# Patient Record
Sex: Female | Born: 1950
Health system: Southern US, Community
[De-identification: ages and names within clinical notes are randomized; demographics above are authoritative.]

## PROBLEM LIST (undated history)

## (undated) DIAGNOSIS — E079 Disorder of thyroid, unspecified: Secondary | ICD-10-CM

## (undated) DIAGNOSIS — H269 Unspecified cataract: Secondary | ICD-10-CM

## (undated) DIAGNOSIS — C329 Malignant neoplasm of larynx, unspecified: Secondary | ICD-10-CM

## (undated) DIAGNOSIS — Z923 Personal history of irradiation: Secondary | ICD-10-CM

## (undated) HISTORY — DX: Personal history of irradiation: Z92.3

## (undated) HISTORY — PX: CATARACT EXTRACTION, BILATERAL: SHX1313

## (undated) MED FILL — Dexamethasone Sodium Phosphate Inj 100 MG/10ML: INTRAMUSCULAR | Qty: 1 | Status: AC

---

## 2001-06-08 ENCOUNTER — Other Ambulatory Visit: Admission: RE | Admit: 2001-06-08 | Discharge: 2001-06-08 | Payer: Self-pay | Admitting: Family Medicine

## 2007-05-13 ENCOUNTER — Ambulatory Visit: Payer: Self-pay | Admitting: Cardiology

## 2007-05-27 ENCOUNTER — Ambulatory Visit: Payer: Self-pay

## 2007-05-27 ENCOUNTER — Encounter: Payer: Self-pay | Admitting: Cardiology

## 2007-06-05 ENCOUNTER — Ambulatory Visit: Payer: Self-pay | Admitting: Cardiology

## 2010-10-15 ENCOUNTER — Encounter: Payer: Self-pay | Admitting: Family Medicine

## 2011-02-05 NOTE — Assessment & Plan Note (Signed)
[facility]                            CARDIOLOGY OFFICE NOTE   NAME:[name], [name]                        MRN:          [id number]  DATE:05/13/2007                            DOB:          12-Apr-1951    REASON FOR CONSULTATION:  Relative hypotension and episodes of syncope.   HISTORY OF PRESENT ILLNESS:  [name] [name] is a pleasant 60 year old woman  with no reported chronic major medical conditions. She states that  approximately 1 to 2 months ago she noted that she fairly abruptly began  to feel more weak in general, particularly when she was standing. She  had a blackout spell after standing and ambulating for a period of  time preceded by a feeling of black dots in front of her eyes and  clammy sensation. This was followed by a frank fall to the ground and  a brief episode of syncope,  although she states that after this  occurred she was able to go about her business without difficultly. She  had a subsequent event similar to this although she was able to abort  actual syncope by sitting down and putting her head down. She has also  noted a relative increase in lower extremity edema but no orthopnea or  PND. She has had no sense of palpitations and in fact has been wearing  an event recorder. The available strips that I have show sinus rhythm  with no significant bradycardia, no ventricular arrhythmias, and only  occasional premature supraventricular complexes. She was noted to have  fairly low blood pressures on evaluation initially,with systolics down  in the 80 range. She was actually placed in Florinef and does note that  she has had improvement in her symptoms. She no longer has experienced  any significant dizziness or syncope. Blood pressure on a follow up  visit in late July was 100/70, heart rate in the 80s on Florinef. She  has also liberalized her salt intake. On the 6th of August blood  pressure was 104/72 with heart rate in the  90s.   Proceeding this onset of symptomatology she denies having any other  illness. She has had some abdominal pain, febrile illnesses, cough,  obvious sick contacts, changes in appetite or bowel habits. She has no  clearly documented history of adrenal disease. No other major abdominal  surgeries other than a prior Caesarian section in the 1980s.   ALLERGIES:  No known drug allergies.   PRESENT MEDICATIONS:  1. Florinef 0.1 mg p.o. daily.  2. Woman's multivitamin daily.  3. Aspirin 81 mg p.o. daily.   PAST MEDICAL HISTORY:  As outlined in above.   FAMILY HISTORY:  Significant for death of patient's father at age 12  with a heart attack.   REVIEW OF SYSTEMS:  As described in history of present illness.  Otherwise negative.   SOCIAL HISTORY:  Patient has 4 children. She works at an [employer], hospital. She has a 1 pack per day tobacco abuse history for 20 years.  No significant alcohol use. Drinks 3 glasses of caffeinated tea a day.   PHYSICAL  EXAMINATION:  Supine blood pressure 120/76 with a heart rate of  86, seated blood pressure 123/80 with a heart rate of 88, and standing  blood pressure after 5 minutes of 122/82 heart rate of 93. No symptoms  elicited during this time. This is an overweight woman 220 pounds  otherwise in no acute distress.  HEENT: Normal.  NECK: Supple. No elevated jugular venous pressure or loud bruits. No  thyromegaly is noted.  LUNGS: Clear. Somewhat diminished breath sounds. No wheezing or labored  breathing.  CARDIAC EXAM: Reveals a regular rate and rhythm, somewhat distant heart  sounds. No loud murmur or S3 gallop. No pericardial rub is evident.  ABDOMEN: Soft, nontender. No bruits. Bowel sounds are present.  EXTREMITIES: Exhibit trace to 1+ edema below the knees bilaterally. No  asymmetry.  SKIN: Warm and dry.  Distal pulses are 2+.  MUSCULOSKELETAL: No kyphosis is noted.  NEURO/PSYCHIATRIC: Patient is alert and oriented x3. Affect is  normal.   A 12-lead electrocardiogram today shows sinus rhythm, relatively low  voltage, otherwise no significant changes.   IMPRESSION/RECOMMENDATIONS:  1. History of relative hypotension associated with a reported episode      of syncope and an additional episode of near syncope. This has been      orthostatic in description and not associated with any palpitations      or obvious dysrhythmias based on present 30 day event recorder. The      resting electrocardiogram is relatively nonspecific with the except      of low voltage. She has had no chest pain or breathlessness      associated with this and preceding the onset of these symptoms has      never had any prior similar symptomatology. She has had no fainting      episodes in the past. She has noted an improvement on Florinef and      is in fact normotensive and not orthostatic today. She has had      other recent blood work indicating normal renal function, normal      potassium and sodium, and is generally feeling better at this      point. She does have a family history of premature cardiovascular      disease and has not had prior risk stratification. I think it would      be unusual for her present symptoms to be related to underlying      ischemic heart disease, although not unreasonable to assess      particularly with an eye towards left ventricular function. We      talked about proceeding with an echocardiogram and an adenosine      Myoview for basic cardiac risk stratification. If these studies are      normal it may be worth considering other etiologies such as adrenal      insufficiency, although one would wonder why this occurred suddenly      without any other obvious symptoms. I have asked her to continue      Florinef.  Neurocardiogenic syncope would be another obvious      concern, although again one would wonder why this has all of a      sudden become a problem without any prior symptomatology. I will       have her      follow up in the office over the next few weeks to discuss the      results of her testing.  2. Further  plans to follow.     Jonelle Sidle, MD  Electronically Signed    SGM/MedQ  DD: 05/13/2007  DT: 05/14/2007  Job #: [id number]   cc:   Burnell Blanks, MD

## 2011-02-05 NOTE — Assessment & Plan Note (Signed)
[facility]                            CARDIOLOGY OFFICE NOTE   NAME:[name] [name]                        MRN:          [id number]  DATE:06/05/2007                            DOB:          [birth year]    PRIMARY CARE PHYSICIAN:  Dr. Sharman Crate Hamrick   REASON FOR VISIT:  Follow up cardiac testing.   HISTORY OF PRESENT ILLNESS:  I saw [name] [name] recently in August. Her  history is detailed in my previous note. I referred her for basic  cardiac risk stratification. This was overall reassuring including an  echocardiogram demonstrating a left ventricular ejection fraction of 50%  to 55% with no major valvular abnormalities. She also had a Myoview  study demonstrating no significant ischemia or scar with an ejection  fraction of 67%. There were no diagnostic ST segment changes to suggest  ischemia. The patient states that she still has occasional feelings of  light headedness and systolic blood pressures recorded in the 80s at  times. She is taking her Florinef and generally this has helped with the  severity of her symptoms. I talked with her today about the fact that  there did not seem to be a pure cardiac cause for her symptoms. A  neurocardiogenic mechanism could be considered although it would be  unusual for this to present suddenly and without any prior symptoms at a  younger age, and could be managed with Florinef in any event. It would  also be reasonable to consider other endocrine causes such as thyroid or  adrenal disease, and perhaps even a neurological assessment if nothing  else is uncovered.   ALLERGIES:  No known drug allergies.   CURRENT MEDICATIONS:  1. Fludrocortisone 0.1 mg daily.  2. Multivitamin.  3. Aspirin 81 mg daily.   REVIEW OF SYSTEMS:  As described in the history of present illness.   PHYSICAL EXAMINATION:  VITAL SIGNS:  Today's blood pressure is 115/80,  heart rate 87, weight 223 pounds.  GENERAL:  The patient is  comfortable and in no acute distress. There  have been no significant changes in her examination which is documented  in the recent note from late August.   IMPRESSION AND RECOMMENDATIONS:  1. Hypotension, some largely orthostatic, improved after the addition      of Florinef. This was originally associated with syncope, although      frank syncopal events have resolved. Etiology is not entirely      clear, although it does not appear to be purely cardiac based on      her reassuring ischemic testing, findings of overall normal left      ventricular ejection fraction, and no significant pericardial      effusion. A neurocardiogenic mechanism could certainly be      considered, although it is a little unusual that this would all of      a sudden become a problem without having had any prior symptoms at      a younger age. We do not plan any additional cardiac testing at      this particular time  and I will refer the patient back to Dr.      Nathanial Rancher for consideration of other endocrine causes such as thyroid      or adrenal disease and perhaps even a formal neurological      consultation.  2. Cardiology follow up can be p.r.n.     Jonelle Sidle, MD  Electronically Signed    SGM/MedQ  DD: 06/05/2007  DT: 06/06/2007  Job #: [id number]   cc:   Burnell Blanks, MD

## 2016-01-16 DIAGNOSIS — Z79899 Other long term (current) drug therapy: Secondary | ICD-10-CM | POA: Diagnosis not present

## 2016-01-16 DIAGNOSIS — E559 Vitamin D deficiency, unspecified: Secondary | ICD-10-CM | POA: Diagnosis not present

## 2016-01-16 DIAGNOSIS — E781 Pure hyperglyceridemia: Secondary | ICD-10-CM | POA: Diagnosis not present

## 2016-01-16 DIAGNOSIS — E039 Hypothyroidism, unspecified: Secondary | ICD-10-CM | POA: Diagnosis not present

## 2016-01-16 DIAGNOSIS — E538 Deficiency of other specified B group vitamins: Secondary | ICD-10-CM | POA: Diagnosis not present

## 2016-01-17 DIAGNOSIS — E559 Vitamin D deficiency, unspecified: Secondary | ICD-10-CM | POA: Diagnosis not present

## 2016-01-17 DIAGNOSIS — E039 Hypothyroidism, unspecified: Secondary | ICD-10-CM | POA: Diagnosis not present

## 2016-01-17 DIAGNOSIS — E538 Deficiency of other specified B group vitamins: Secondary | ICD-10-CM | POA: Diagnosis not present

## 2016-01-17 DIAGNOSIS — E669 Obesity, unspecified: Secondary | ICD-10-CM | POA: Diagnosis not present

## 2016-01-17 DIAGNOSIS — E781 Pure hyperglyceridemia: Secondary | ICD-10-CM | POA: Diagnosis not present

## 2016-01-17 DIAGNOSIS — Z1231 Encounter for screening mammogram for malignant neoplasm of breast: Secondary | ICD-10-CM | POA: Diagnosis not present

## 2016-01-17 DIAGNOSIS — Z6838 Body mass index (BMI) 38.0-38.9, adult: Secondary | ICD-10-CM | POA: Diagnosis not present

## 2016-03-19 DIAGNOSIS — Z1231 Encounter for screening mammogram for malignant neoplasm of breast: Secondary | ICD-10-CM | POA: Diagnosis not present

## 2016-03-25 DIAGNOSIS — M1711 Unilateral primary osteoarthritis, right knee: Secondary | ICD-10-CM | POA: Diagnosis not present

## 2016-05-07 DIAGNOSIS — H2513 Age-related nuclear cataract, bilateral: Secondary | ICD-10-CM | POA: Diagnosis not present

## 2016-05-14 DIAGNOSIS — Z6838 Body mass index (BMI) 38.0-38.9, adult: Secondary | ICD-10-CM | POA: Diagnosis not present

## 2016-05-14 DIAGNOSIS — Z01818 Encounter for other preprocedural examination: Secondary | ICD-10-CM | POA: Diagnosis not present

## 2016-05-14 DIAGNOSIS — E039 Hypothyroidism, unspecified: Secondary | ICD-10-CM | POA: Diagnosis not present

## 2016-05-14 DIAGNOSIS — H269 Unspecified cataract: Secondary | ICD-10-CM | POA: Diagnosis not present

## 2016-05-20 DIAGNOSIS — F17210 Nicotine dependence, cigarettes, uncomplicated: Secondary | ICD-10-CM | POA: Diagnosis not present

## 2016-05-20 DIAGNOSIS — H2512 Age-related nuclear cataract, left eye: Secondary | ICD-10-CM | POA: Diagnosis not present

## 2016-05-20 DIAGNOSIS — E785 Hyperlipidemia, unspecified: Secondary | ICD-10-CM | POA: Diagnosis not present

## 2016-05-22 DIAGNOSIS — H43393 Other vitreous opacities, bilateral: Secondary | ICD-10-CM | POA: Diagnosis not present

## 2016-05-22 DIAGNOSIS — Z9889 Other specified postprocedural states: Secondary | ICD-10-CM | POA: Diagnosis not present

## 2016-05-22 DIAGNOSIS — H2513 Age-related nuclear cataract, bilateral: Secondary | ICD-10-CM | POA: Diagnosis not present

## 2016-05-22 DIAGNOSIS — H35342 Macular cyst, hole, or pseudohole, left eye: Secondary | ICD-10-CM | POA: Diagnosis not present

## 2016-05-29 DIAGNOSIS — H2512 Age-related nuclear cataract, left eye: Secondary | ICD-10-CM | POA: Diagnosis not present

## 2016-06-03 DIAGNOSIS — H2511 Age-related nuclear cataract, right eye: Secondary | ICD-10-CM | POA: Diagnosis not present

## 2016-06-03 DIAGNOSIS — F17210 Nicotine dependence, cigarettes, uncomplicated: Secondary | ICD-10-CM | POA: Diagnosis not present

## 2016-06-03 DIAGNOSIS — E785 Hyperlipidemia, unspecified: Secondary | ICD-10-CM | POA: Diagnosis not present

## 2016-06-03 DIAGNOSIS — H269 Unspecified cataract: Secondary | ICD-10-CM | POA: Diagnosis not present

## 2016-07-05 DIAGNOSIS — Z23 Encounter for immunization: Secondary | ICD-10-CM | POA: Diagnosis not present

## 2016-07-23 DIAGNOSIS — E538 Deficiency of other specified B group vitamins: Secondary | ICD-10-CM | POA: Diagnosis not present

## 2016-07-23 DIAGNOSIS — E039 Hypothyroidism, unspecified: Secondary | ICD-10-CM | POA: Diagnosis not present

## 2016-07-23 DIAGNOSIS — E781 Pure hyperglyceridemia: Secondary | ICD-10-CM | POA: Diagnosis not present

## 2016-07-23 DIAGNOSIS — E559 Vitamin D deficiency, unspecified: Secondary | ICD-10-CM | POA: Diagnosis not present

## 2016-07-23 DIAGNOSIS — R609 Edema, unspecified: Secondary | ICD-10-CM | POA: Diagnosis not present

## 2016-07-25 DIAGNOSIS — E669 Obesity, unspecified: Secondary | ICD-10-CM | POA: Diagnosis not present

## 2016-07-25 DIAGNOSIS — E781 Pure hyperglyceridemia: Secondary | ICD-10-CM | POA: Diagnosis not present

## 2016-07-25 DIAGNOSIS — Z23 Encounter for immunization: Secondary | ICD-10-CM | POA: Diagnosis not present

## 2016-07-25 DIAGNOSIS — E538 Deficiency of other specified B group vitamins: Secondary | ICD-10-CM | POA: Diagnosis not present

## 2016-07-25 DIAGNOSIS — Z9181 History of falling: Secondary | ICD-10-CM | POA: Diagnosis not present

## 2016-07-25 DIAGNOSIS — E785 Hyperlipidemia, unspecified: Secondary | ICD-10-CM | POA: Diagnosis not present

## 2016-07-25 DIAGNOSIS — G2581 Restless legs syndrome: Secondary | ICD-10-CM | POA: Diagnosis not present

## 2016-07-25 DIAGNOSIS — K219 Gastro-esophageal reflux disease without esophagitis: Secondary | ICD-10-CM | POA: Diagnosis not present

## 2016-07-25 DIAGNOSIS — M2550 Pain in unspecified joint: Secondary | ICD-10-CM | POA: Diagnosis not present

## 2016-07-25 DIAGNOSIS — Z6839 Body mass index (BMI) 39.0-39.9, adult: Secondary | ICD-10-CM | POA: Diagnosis not present

## 2016-07-25 DIAGNOSIS — Z1389 Encounter for screening for other disorder: Secondary | ICD-10-CM | POA: Diagnosis not present

## 2016-07-25 DIAGNOSIS — E039 Hypothyroidism, unspecified: Secondary | ICD-10-CM | POA: Diagnosis not present

## 2017-01-23 DIAGNOSIS — E781 Pure hyperglyceridemia: Secondary | ICD-10-CM | POA: Diagnosis not present

## 2017-01-23 DIAGNOSIS — R609 Edema, unspecified: Secondary | ICD-10-CM | POA: Diagnosis not present

## 2017-01-23 DIAGNOSIS — E039 Hypothyroidism, unspecified: Secondary | ICD-10-CM | POA: Diagnosis not present

## 2017-01-23 DIAGNOSIS — E559 Vitamin D deficiency, unspecified: Secondary | ICD-10-CM | POA: Diagnosis not present

## 2017-01-28 DIAGNOSIS — E538 Deficiency of other specified B group vitamins: Secondary | ICD-10-CM | POA: Diagnosis not present

## 2017-01-28 DIAGNOSIS — Z6841 Body Mass Index (BMI) 40.0 and over, adult: Secondary | ICD-10-CM | POA: Diagnosis not present

## 2017-01-28 DIAGNOSIS — M1711 Unilateral primary osteoarthritis, right knee: Secondary | ICD-10-CM | POA: Diagnosis not present

## 2017-01-28 DIAGNOSIS — Z87891 Personal history of nicotine dependence: Secondary | ICD-10-CM | POA: Diagnosis not present

## 2017-01-28 DIAGNOSIS — E039 Hypothyroidism, unspecified: Secondary | ICD-10-CM | POA: Diagnosis not present

## 2017-01-28 DIAGNOSIS — E559 Vitamin D deficiency, unspecified: Secondary | ICD-10-CM | POA: Diagnosis not present

## 2017-01-28 DIAGNOSIS — E781 Pure hyperglyceridemia: Secondary | ICD-10-CM | POA: Diagnosis not present

## 2017-01-28 DIAGNOSIS — L309 Dermatitis, unspecified: Secondary | ICD-10-CM | POA: Diagnosis not present

## 2017-05-19 DIAGNOSIS — M170 Bilateral primary osteoarthritis of knee: Secondary | ICD-10-CM | POA: Diagnosis not present

## 2017-05-19 DIAGNOSIS — M25562 Pain in left knee: Secondary | ICD-10-CM | POA: Diagnosis not present

## 2017-05-19 DIAGNOSIS — M25561 Pain in right knee: Secondary | ICD-10-CM | POA: Diagnosis not present

## 2017-05-27 DIAGNOSIS — M25561 Pain in right knee: Secondary | ICD-10-CM | POA: Diagnosis not present

## 2017-05-27 DIAGNOSIS — M1711 Unilateral primary osteoarthritis, right knee: Secondary | ICD-10-CM | POA: Diagnosis not present

## 2017-06-03 DIAGNOSIS — M25562 Pain in left knee: Secondary | ICD-10-CM | POA: Diagnosis not present

## 2017-06-03 DIAGNOSIS — M1712 Unilateral primary osteoarthritis, left knee: Secondary | ICD-10-CM | POA: Diagnosis not present

## 2017-06-10 DIAGNOSIS — M170 Bilateral primary osteoarthritis of knee: Secondary | ICD-10-CM | POA: Diagnosis not present

## 2017-06-10 DIAGNOSIS — M25561 Pain in right knee: Secondary | ICD-10-CM | POA: Diagnosis not present

## 2017-06-10 DIAGNOSIS — M25562 Pain in left knee: Secondary | ICD-10-CM | POA: Diagnosis not present

## 2017-07-08 DIAGNOSIS — Z23 Encounter for immunization: Secondary | ICD-10-CM | POA: Diagnosis not present

## 2017-07-31 DIAGNOSIS — H15002 Unspecified scleritis, left eye: Secondary | ICD-10-CM | POA: Diagnosis not present

## 2017-07-31 DIAGNOSIS — H109 Unspecified conjunctivitis: Secondary | ICD-10-CM | POA: Diagnosis not present

## 2017-07-31 DIAGNOSIS — Z87891 Personal history of nicotine dependence: Secondary | ICD-10-CM | POA: Diagnosis not present

## 2017-08-11 DIAGNOSIS — E781 Pure hyperglyceridemia: Secondary | ICD-10-CM | POA: Diagnosis not present

## 2017-08-11 DIAGNOSIS — E039 Hypothyroidism, unspecified: Secondary | ICD-10-CM | POA: Diagnosis not present

## 2017-08-11 DIAGNOSIS — E559 Vitamin D deficiency, unspecified: Secondary | ICD-10-CM | POA: Diagnosis not present

## 2017-08-11 DIAGNOSIS — E538 Deficiency of other specified B group vitamins: Secondary | ICD-10-CM | POA: Diagnosis not present

## 2017-08-18 DIAGNOSIS — E781 Pure hyperglyceridemia: Secondary | ICD-10-CM | POA: Diagnosis not present

## 2017-08-18 DIAGNOSIS — Z6839 Body mass index (BMI) 39.0-39.9, adult: Secondary | ICD-10-CM | POA: Diagnosis not present

## 2017-08-18 DIAGNOSIS — Z87891 Personal history of nicotine dependence: Secondary | ICD-10-CM | POA: Diagnosis not present

## 2017-08-18 DIAGNOSIS — E538 Deficiency of other specified B group vitamins: Secondary | ICD-10-CM | POA: Diagnosis not present

## 2017-08-18 DIAGNOSIS — E039 Hypothyroidism, unspecified: Secondary | ICD-10-CM | POA: Diagnosis not present

## 2017-08-18 DIAGNOSIS — E559 Vitamin D deficiency, unspecified: Secondary | ICD-10-CM | POA: Diagnosis not present

## 2017-08-18 DIAGNOSIS — K219 Gastro-esophageal reflux disease without esophagitis: Secondary | ICD-10-CM | POA: Diagnosis not present

## 2017-09-23 HISTORY — PX: KNEE CARTILAGE SURGERY: SHX688

## 2017-10-09 DIAGNOSIS — M25561 Pain in right knee: Secondary | ICD-10-CM | POA: Diagnosis not present

## 2017-10-09 DIAGNOSIS — M25461 Effusion, right knee: Secondary | ICD-10-CM | POA: Diagnosis not present

## 2017-10-09 DIAGNOSIS — M170 Bilateral primary osteoarthritis of knee: Secondary | ICD-10-CM | POA: Diagnosis not present

## 2017-10-09 DIAGNOSIS — M1711 Unilateral primary osteoarthritis, right knee: Secondary | ICD-10-CM | POA: Diagnosis not present

## 2017-10-09 DIAGNOSIS — M21162 Varus deformity, not elsewhere classified, left knee: Secondary | ICD-10-CM | POA: Diagnosis not present

## 2017-10-09 DIAGNOSIS — M25562 Pain in left knee: Secondary | ICD-10-CM | POA: Diagnosis not present

## 2017-10-22 DIAGNOSIS — M1711 Unilateral primary osteoarthritis, right knee: Secondary | ICD-10-CM | POA: Diagnosis not present

## 2017-10-22 DIAGNOSIS — M25561 Pain in right knee: Secondary | ICD-10-CM | POA: Diagnosis not present

## 2017-10-29 DIAGNOSIS — M1711 Unilateral primary osteoarthritis, right knee: Secondary | ICD-10-CM | POA: Diagnosis not present

## 2017-10-29 DIAGNOSIS — M25561 Pain in right knee: Secondary | ICD-10-CM | POA: Diagnosis not present

## 2017-11-05 DIAGNOSIS — M25561 Pain in right knee: Secondary | ICD-10-CM | POA: Diagnosis not present

## 2017-11-05 DIAGNOSIS — M1711 Unilateral primary osteoarthritis, right knee: Secondary | ICD-10-CM | POA: Diagnosis not present

## 2017-11-19 DIAGNOSIS — M1711 Unilateral primary osteoarthritis, right knee: Secondary | ICD-10-CM | POA: Diagnosis not present

## 2017-11-19 DIAGNOSIS — M25561 Pain in right knee: Secondary | ICD-10-CM | POA: Diagnosis not present

## 2017-11-28 DIAGNOSIS — J069 Acute upper respiratory infection, unspecified: Secondary | ICD-10-CM | POA: Diagnosis not present

## 2017-11-28 DIAGNOSIS — Z6837 Body mass index (BMI) 37.0-37.9, adult: Secondary | ICD-10-CM | POA: Diagnosis not present

## 2017-11-28 DIAGNOSIS — Z87891 Personal history of nicotine dependence: Secondary | ICD-10-CM | POA: Diagnosis not present

## 2018-02-13 DIAGNOSIS — Z139 Encounter for screening, unspecified: Secondary | ICD-10-CM | POA: Diagnosis not present

## 2018-02-13 DIAGNOSIS — E559 Vitamin D deficiency, unspecified: Secondary | ICD-10-CM | POA: Diagnosis not present

## 2018-02-13 DIAGNOSIS — Z1211 Encounter for screening for malignant neoplasm of colon: Secondary | ICD-10-CM | POA: Diagnosis not present

## 2018-02-13 DIAGNOSIS — Z9181 History of falling: Secondary | ICD-10-CM | POA: Diagnosis not present

## 2018-02-13 DIAGNOSIS — E538 Deficiency of other specified B group vitamins: Secondary | ICD-10-CM | POA: Diagnosis not present

## 2018-02-13 DIAGNOSIS — E781 Pure hyperglyceridemia: Secondary | ICD-10-CM | POA: Diagnosis not present

## 2018-02-13 DIAGNOSIS — Z6837 Body mass index (BMI) 37.0-37.9, adult: Secondary | ICD-10-CM | POA: Diagnosis not present

## 2018-02-13 DIAGNOSIS — E669 Obesity, unspecified: Secondary | ICD-10-CM | POA: Diagnosis not present

## 2018-02-13 DIAGNOSIS — Z0000 Encounter for general adult medical examination without abnormal findings: Secondary | ICD-10-CM | POA: Diagnosis not present

## 2018-02-13 DIAGNOSIS — E785 Hyperlipidemia, unspecified: Secondary | ICD-10-CM | POA: Diagnosis not present

## 2018-02-13 DIAGNOSIS — Z136 Encounter for screening for cardiovascular disorders: Secondary | ICD-10-CM | POA: Diagnosis not present

## 2018-02-13 DIAGNOSIS — N959 Unspecified menopausal and perimenopausal disorder: Secondary | ICD-10-CM | POA: Diagnosis not present

## 2018-02-13 DIAGNOSIS — E039 Hypothyroidism, unspecified: Secondary | ICD-10-CM | POA: Diagnosis not present

## 2018-02-18 DIAGNOSIS — E559 Vitamin D deficiency, unspecified: Secondary | ICD-10-CM | POA: Diagnosis not present

## 2018-02-18 DIAGNOSIS — E039 Hypothyroidism, unspecified: Secondary | ICD-10-CM | POA: Diagnosis not present

## 2018-02-18 DIAGNOSIS — E781 Pure hyperglyceridemia: Secondary | ICD-10-CM | POA: Diagnosis not present

## 2018-02-18 DIAGNOSIS — E538 Deficiency of other specified B group vitamins: Secondary | ICD-10-CM | POA: Diagnosis not present

## 2018-03-13 DIAGNOSIS — R062 Wheezing: Secondary | ICD-10-CM | POA: Diagnosis not present

## 2018-03-13 DIAGNOSIS — J209 Acute bronchitis, unspecified: Secondary | ICD-10-CM | POA: Diagnosis not present

## 2018-03-13 DIAGNOSIS — R05 Cough: Secondary | ICD-10-CM | POA: Diagnosis not present

## 2018-03-23 DIAGNOSIS — M25561 Pain in right knee: Secondary | ICD-10-CM | POA: Diagnosis not present

## 2018-03-23 DIAGNOSIS — M1711 Unilateral primary osteoarthritis, right knee: Secondary | ICD-10-CM | POA: Diagnosis not present

## 2018-03-27 DIAGNOSIS — Z1211 Encounter for screening for malignant neoplasm of colon: Secondary | ICD-10-CM | POA: Diagnosis not present

## 2018-04-09 DIAGNOSIS — M79651 Pain in right thigh: Secondary | ICD-10-CM | POA: Diagnosis not present

## 2018-04-09 DIAGNOSIS — S76111A Strain of right quadriceps muscle, fascia and tendon, initial encounter: Secondary | ICD-10-CM | POA: Diagnosis not present

## 2018-05-27 DIAGNOSIS — M1711 Unilateral primary osteoarthritis, right knee: Secondary | ICD-10-CM | POA: Diagnosis not present

## 2018-05-27 DIAGNOSIS — M25561 Pain in right knee: Secondary | ICD-10-CM | POA: Diagnosis not present

## 2018-06-02 DIAGNOSIS — M25561 Pain in right knee: Secondary | ICD-10-CM | POA: Diagnosis not present

## 2018-06-02 DIAGNOSIS — M1711 Unilateral primary osteoarthritis, right knee: Secondary | ICD-10-CM | POA: Diagnosis not present

## 2018-06-08 DIAGNOSIS — M542 Cervicalgia: Secondary | ICD-10-CM | POA: Diagnosis not present

## 2018-06-08 DIAGNOSIS — M25561 Pain in right knee: Secondary | ICD-10-CM | POA: Diagnosis not present

## 2018-06-08 DIAGNOSIS — M1711 Unilateral primary osteoarthritis, right knee: Secondary | ICD-10-CM | POA: Diagnosis not present

## 2018-06-08 DIAGNOSIS — Z6836 Body mass index (BMI) 36.0-36.9, adult: Secondary | ICD-10-CM | POA: Diagnosis not present

## 2018-06-15 DIAGNOSIS — M25561 Pain in right knee: Secondary | ICD-10-CM | POA: Diagnosis not present

## 2018-06-15 DIAGNOSIS — M1711 Unilateral primary osteoarthritis, right knee: Secondary | ICD-10-CM | POA: Diagnosis not present

## 2018-06-22 DIAGNOSIS — M1711 Unilateral primary osteoarthritis, right knee: Secondary | ICD-10-CM | POA: Diagnosis not present

## 2018-06-22 DIAGNOSIS — M25561 Pain in right knee: Secondary | ICD-10-CM | POA: Diagnosis not present

## 2018-07-22 DIAGNOSIS — H6592 Unspecified nonsuppurative otitis media, left ear: Secondary | ICD-10-CM | POA: Diagnosis not present

## 2018-07-22 DIAGNOSIS — Z6835 Body mass index (BMI) 35.0-35.9, adult: Secondary | ICD-10-CM | POA: Diagnosis not present

## 2018-07-22 DIAGNOSIS — Z23 Encounter for immunization: Secondary | ICD-10-CM | POA: Diagnosis not present

## 2018-08-24 DIAGNOSIS — R609 Edema, unspecified: Secondary | ICD-10-CM | POA: Diagnosis not present

## 2018-08-24 DIAGNOSIS — E781 Pure hyperglyceridemia: Secondary | ICD-10-CM | POA: Diagnosis not present

## 2018-08-24 DIAGNOSIS — E039 Hypothyroidism, unspecified: Secondary | ICD-10-CM | POA: Diagnosis not present

## 2018-09-01 DIAGNOSIS — Z01419 Encounter for gynecological examination (general) (routine) without abnormal findings: Secondary | ICD-10-CM | POA: Diagnosis not present

## 2018-09-01 DIAGNOSIS — Z1231 Encounter for screening mammogram for malignant neoplasm of breast: Secondary | ICD-10-CM | POA: Diagnosis not present

## 2018-09-01 DIAGNOSIS — Z124 Encounter for screening for malignant neoplasm of cervix: Secondary | ICD-10-CM | POA: Diagnosis not present

## 2018-09-01 DIAGNOSIS — Z780 Asymptomatic menopausal state: Secondary | ICD-10-CM | POA: Diagnosis not present

## 2018-10-13 DIAGNOSIS — J019 Acute sinusitis, unspecified: Secondary | ICD-10-CM | POA: Diagnosis not present

## 2018-10-13 DIAGNOSIS — R070 Pain in throat: Secondary | ICD-10-CM | POA: Diagnosis not present

## 2018-11-02 DIAGNOSIS — Z13820 Encounter for screening for osteoporosis: Secondary | ICD-10-CM | POA: Diagnosis not present

## 2018-11-02 DIAGNOSIS — Z1231 Encounter for screening mammogram for malignant neoplasm of breast: Secondary | ICD-10-CM | POA: Diagnosis not present

## 2018-11-02 DIAGNOSIS — E2839 Other primary ovarian failure: Secondary | ICD-10-CM | POA: Diagnosis not present

## 2018-11-20 DIAGNOSIS — R490 Dysphonia: Secondary | ICD-10-CM | POA: Diagnosis not present

## 2018-11-20 DIAGNOSIS — E039 Hypothyroidism, unspecified: Secondary | ICD-10-CM | POA: Diagnosis not present

## 2018-11-20 DIAGNOSIS — Z6832 Body mass index (BMI) 32.0-32.9, adult: Secondary | ICD-10-CM | POA: Diagnosis not present

## 2018-11-20 DIAGNOSIS — R400 Somnolence: Secondary | ICD-10-CM | POA: Diagnosis not present

## 2018-11-22 DIAGNOSIS — C329 Malignant neoplasm of larynx, unspecified: Secondary | ICD-10-CM

## 2018-11-22 HISTORY — DX: Malignant neoplasm of larynx, unspecified: C32.9

## 2018-11-25 DIAGNOSIS — Z6832 Body mass index (BMI) 32.0-32.9, adult: Secondary | ICD-10-CM | POA: Diagnosis not present

## 2018-11-25 DIAGNOSIS — J209 Acute bronchitis, unspecified: Secondary | ICD-10-CM | POA: Diagnosis not present

## 2018-12-01 DIAGNOSIS — J209 Acute bronchitis, unspecified: Secondary | ICD-10-CM | POA: Diagnosis not present

## 2018-12-01 DIAGNOSIS — Z6832 Body mass index (BMI) 32.0-32.9, adult: Secondary | ICD-10-CM | POA: Diagnosis not present

## 2018-12-01 DIAGNOSIS — J385 Laryngeal spasm: Secondary | ICD-10-CM | POA: Diagnosis not present

## 2018-12-02 ENCOUNTER — Other Ambulatory Visit: Payer: Self-pay

## 2018-12-02 ENCOUNTER — Ambulatory Visit
Admission: RE | Admit: 2018-12-02 | Discharge: 2018-12-02 | Disposition: A | Discharge status: Home / Self Care | Payer: Medicare HMO | Source: Ambulatory Visit | Attending: Nurse Practitioner | Admitting: Nurse Practitioner

## 2018-12-02 ENCOUNTER — Other Ambulatory Visit: Payer: Self-pay | Admitting: Nurse Practitioner

## 2018-12-02 DIAGNOSIS — R05 Cough: Secondary | ICD-10-CM | POA: Diagnosis not present

## 2018-12-02 DIAGNOSIS — R633 Feeding difficulties: Secondary | ICD-10-CM | POA: Diagnosis not present

## 2018-12-02 DIAGNOSIS — R0603 Acute respiratory distress: Secondary | ICD-10-CM | POA: Diagnosis not present

## 2018-12-02 DIAGNOSIS — J40 Bronchitis, not specified as acute or chronic: Secondary | ICD-10-CM | POA: Diagnosis not present

## 2018-12-02 DIAGNOSIS — T884XXA Failed or difficult intubation, initial encounter: Secondary | ICD-10-CM | POA: Diagnosis not present

## 2018-12-02 DIAGNOSIS — D72829 Elevated white blood cell count, unspecified: Secondary | ICD-10-CM | POA: Diagnosis not present

## 2018-12-02 DIAGNOSIS — R0902 Hypoxemia: Secondary | ICD-10-CM | POA: Diagnosis not present

## 2018-12-02 DIAGNOSIS — Z9911 Dependence on respirator [ventilator] status: Secondary | ICD-10-CM | POA: Diagnosis not present

## 2018-12-02 DIAGNOSIS — R0689 Other abnormalities of breathing: Secondary | ICD-10-CM | POA: Diagnosis not present

## 2018-12-02 DIAGNOSIS — I959 Hypotension, unspecified: Secondary | ICD-10-CM | POA: Diagnosis not present

## 2018-12-02 DIAGNOSIS — J80 Acute respiratory distress syndrome: Secondary | ICD-10-CM | POA: Diagnosis not present

## 2018-12-02 DIAGNOSIS — E873 Alkalosis: Secondary | ICD-10-CM | POA: Diagnosis not present

## 2018-12-02 DIAGNOSIS — Z4682 Encounter for fitting and adjustment of non-vascular catheter: Secondary | ICD-10-CM | POA: Diagnosis not present

## 2018-12-02 DIAGNOSIS — R9389 Abnormal findings on diagnostic imaging of other specified body structures: Secondary | ICD-10-CM

## 2018-12-02 DIAGNOSIS — J9811 Atelectasis: Secondary | ICD-10-CM | POA: Diagnosis not present

## 2018-12-02 DIAGNOSIS — R1313 Dysphagia, pharyngeal phase: Secondary | ICD-10-CM | POA: Diagnosis not present

## 2018-12-02 DIAGNOSIS — R061 Stridor: Secondary | ICD-10-CM | POA: Diagnosis not present

## 2018-12-02 DIAGNOSIS — R599 Enlarged lymph nodes, unspecified: Secondary | ICD-10-CM | POA: Diagnosis not present

## 2018-12-02 DIAGNOSIS — R404 Transient alteration of awareness: Secondary | ICD-10-CM | POA: Diagnosis not present

## 2018-12-02 DIAGNOSIS — R918 Other nonspecific abnormal finding of lung field: Secondary | ICD-10-CM | POA: Diagnosis not present

## 2018-12-02 DIAGNOSIS — J690 Pneumonitis due to inhalation of food and vomit: Secondary | ICD-10-CM | POA: Diagnosis not present

## 2018-12-02 DIAGNOSIS — J9600 Acute respiratory failure, unspecified whether with hypoxia or hypercapnia: Secondary | ICD-10-CM | POA: Diagnosis not present

## 2018-12-02 DIAGNOSIS — R0600 Dyspnea, unspecified: Secondary | ICD-10-CM | POA: Diagnosis not present

## 2018-12-02 DIAGNOSIS — Z87891 Personal history of nicotine dependence: Secondary | ICD-10-CM | POA: Diagnosis not present

## 2018-12-02 DIAGNOSIS — Z430 Encounter for attention to tracheostomy: Secondary | ICD-10-CM | POA: Diagnosis not present

## 2018-12-02 DIAGNOSIS — R1310 Dysphagia, unspecified: Secondary | ICD-10-CM | POA: Diagnosis not present

## 2018-12-02 DIAGNOSIS — E872 Acidosis: Secondary | ICD-10-CM | POA: Diagnosis not present

## 2018-12-02 DIAGNOSIS — I21A1 Myocardial infarction type 2: Secondary | ICD-10-CM | POA: Diagnosis not present

## 2018-12-02 DIAGNOSIS — J810 Acute pulmonary edema: Secondary | ICD-10-CM | POA: Diagnosis not present

## 2018-12-02 DIAGNOSIS — J387 Other diseases of larynx: Secondary | ICD-10-CM | POA: Diagnosis not present

## 2018-12-02 DIAGNOSIS — Z930 Tracheostomy status: Secondary | ICD-10-CM | POA: Diagnosis not present

## 2018-12-02 DIAGNOSIS — J383 Other diseases of vocal cords: Secondary | ICD-10-CM | POA: Diagnosis not present

## 2018-12-02 DIAGNOSIS — R221 Localized swelling, mass and lump, neck: Secondary | ICD-10-CM | POA: Diagnosis not present

## 2018-12-02 DIAGNOSIS — J385 Laryngeal spasm: Secondary | ICD-10-CM | POA: Diagnosis present

## 2018-12-02 DIAGNOSIS — E43 Unspecified severe protein-calorie malnutrition: Secondary | ICD-10-CM | POA: Diagnosis not present

## 2018-12-02 DIAGNOSIS — J9602 Acute respiratory failure with hypercapnia: Secondary | ICD-10-CM | POA: Diagnosis not present

## 2018-12-02 DIAGNOSIS — Z7951 Long term (current) use of inhaled steroids: Secondary | ICD-10-CM | POA: Diagnosis not present

## 2018-12-02 DIAGNOSIS — J9601 Acute respiratory failure with hypoxia: Secondary | ICD-10-CM | POA: Diagnosis not present

## 2018-12-02 DIAGNOSIS — Z79899 Other long term (current) drug therapy: Secondary | ICD-10-CM | POA: Diagnosis not present

## 2018-12-02 DIAGNOSIS — E039 Hypothyroidism, unspecified: Secondary | ICD-10-CM | POA: Diagnosis not present

## 2018-12-02 DIAGNOSIS — C321 Malignant neoplasm of supraglottis: Secondary | ICD-10-CM | POA: Diagnosis not present

## 2018-12-02 DIAGNOSIS — M7989 Other specified soft tissue disorders: Secondary | ICD-10-CM | POA: Diagnosis not present

## 2018-12-02 DIAGNOSIS — J90 Pleural effusion, not elsewhere classified: Secondary | ICD-10-CM | POA: Diagnosis not present

## 2018-12-02 HISTORY — PX: TRACHEOSTOMY: SUR1362

## 2018-12-02 LAB — POCT I-STAT CREATININE: Creatinine, Ser: 0.7 mg/dL (ref 0.44–1.00)

## 2018-12-02 IMAGING — CT CT NECK WITH CONTRAST
3 of 4 series · 12 of 33 positions shown, 14 images · IV contrast (omnipaque)
Comparison: None.

CLINICAL DATA: Stridor and laryngeal spasm.

EXAM:
CT NECK WITH CONTRAST
TECHNIQUE: Multidetector CT imaging of the neck was performed using the
standard protocol following the bolus administration of intravenous
contrast.
CONTRAST:  75mL OMNIPAQUE IOHEXOL 300 MG/ML  SOLN

[Series 7: sag neck · sagittal · 0.52mm/px · 5 of 79 slices shown, 6 images]
[im 27/79  bone]
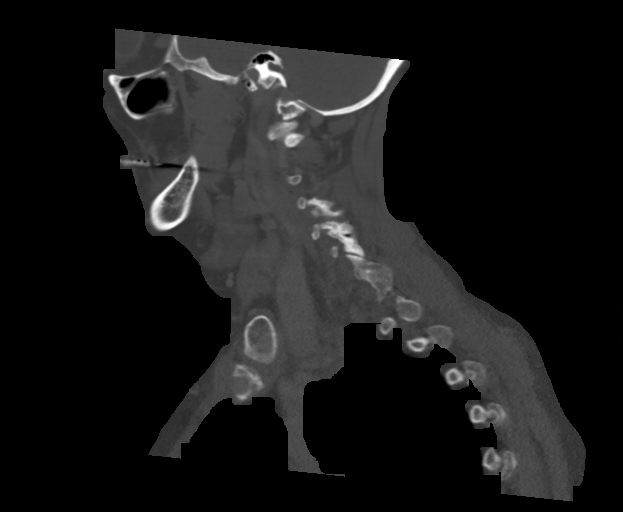
[im 33/79  bone]
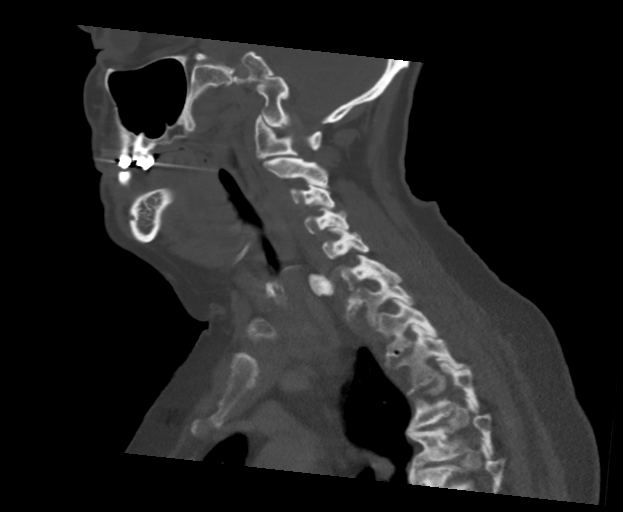
[im 40/79  soft-tissue]
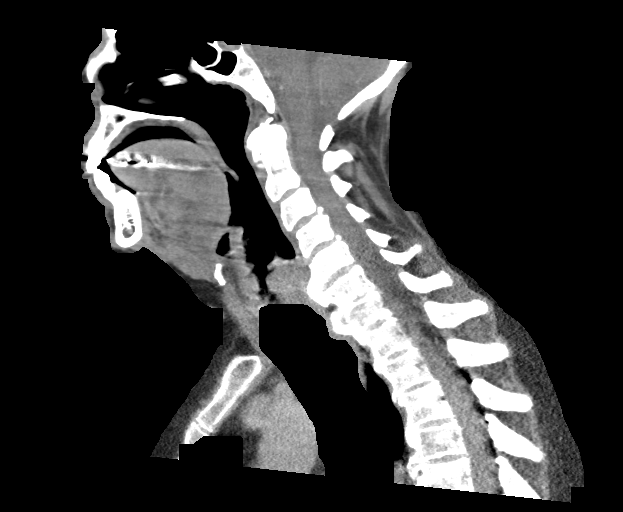
[im 40/79  bone]
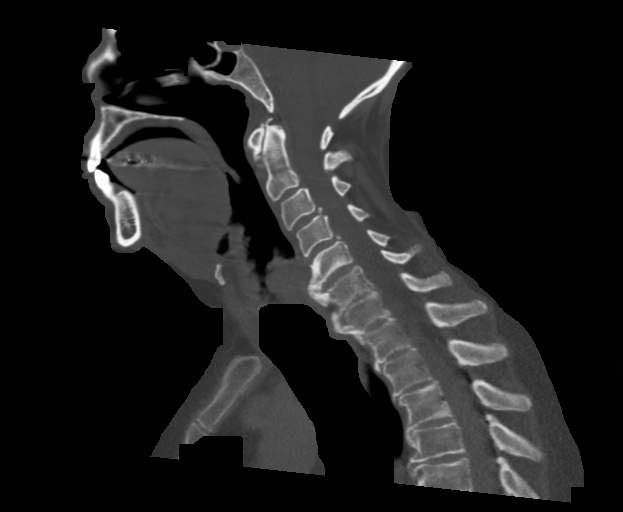
[im 46/79  bone]
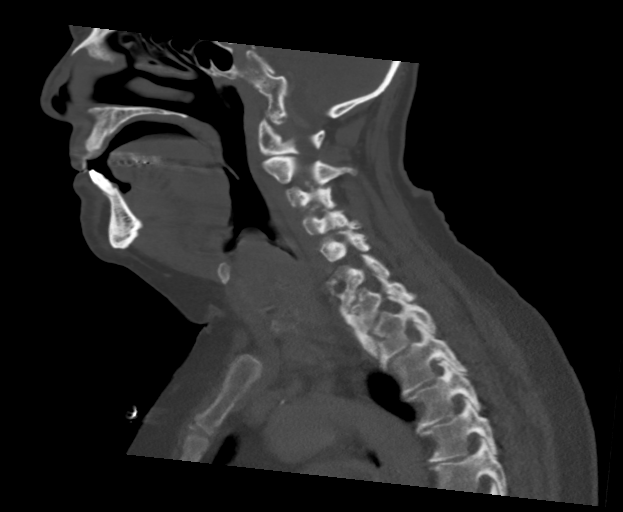
[im 53/79  bone]
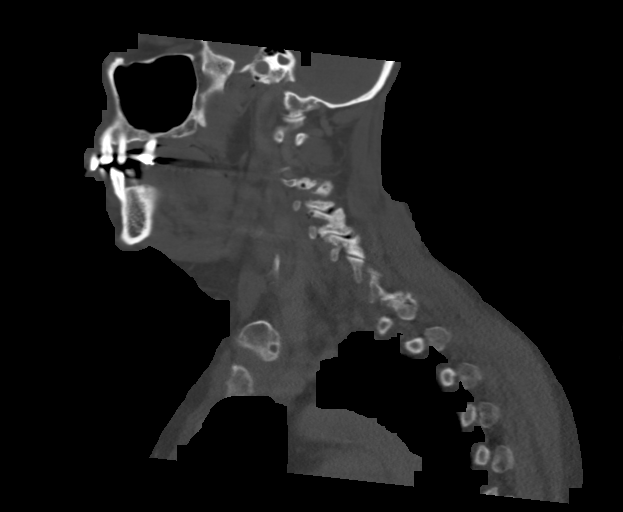

[Series 8: cor neck · coronal · 0.42mm/px · 3 of 141 slices shown]
[im 29/141  bone]
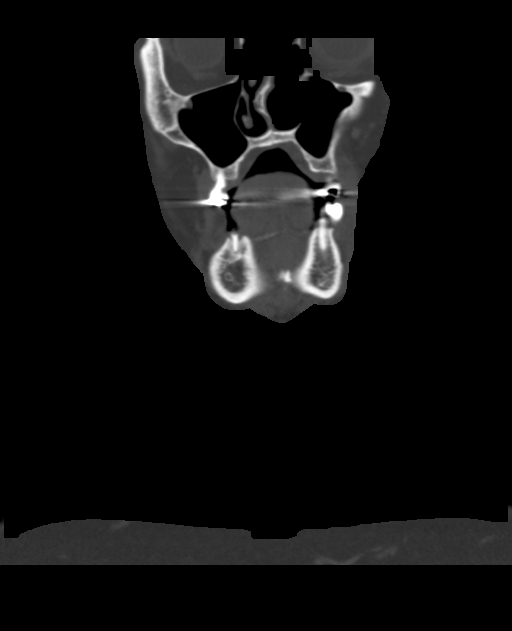
[im 57/141  bone]
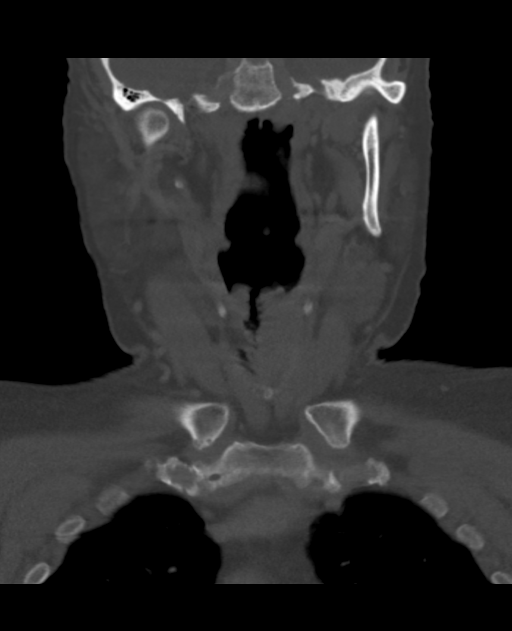
[im 85/141  bone]
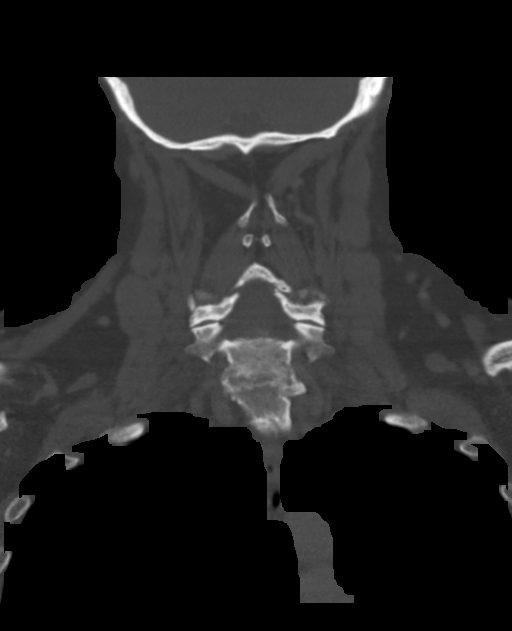

[Series 9: orthogonal ax · axial · 0.39mm/px · z∈[-305,-120]mm · 4 of 141 slices shown, 5 images]
[im 21/141  soft-tissue]
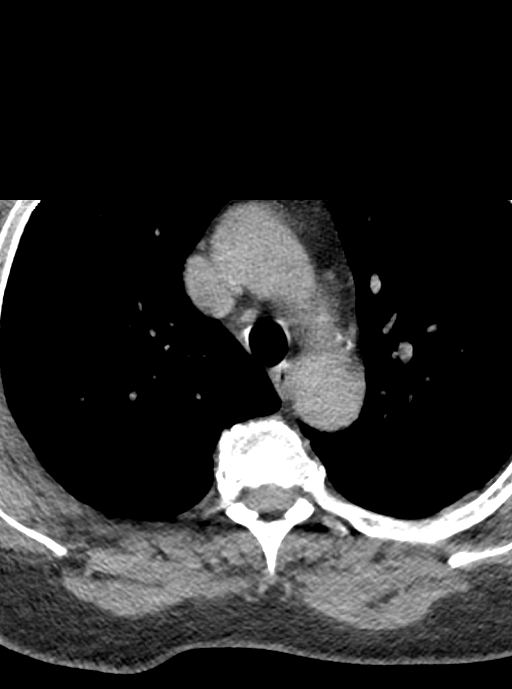
[im 21/141  bone]
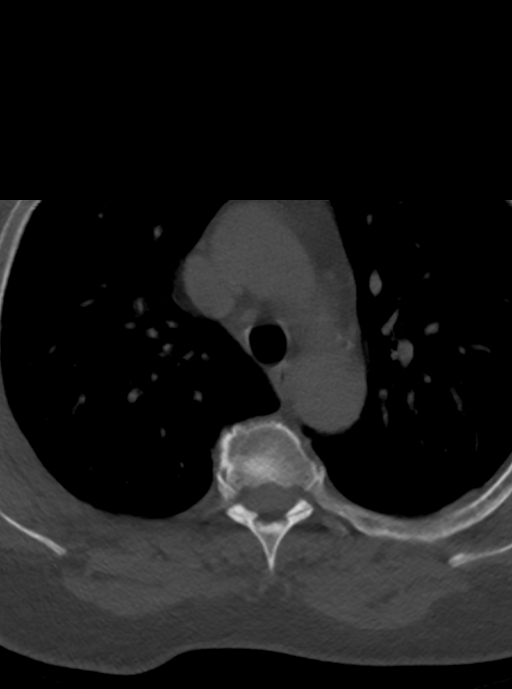
[im 61/141  bone]
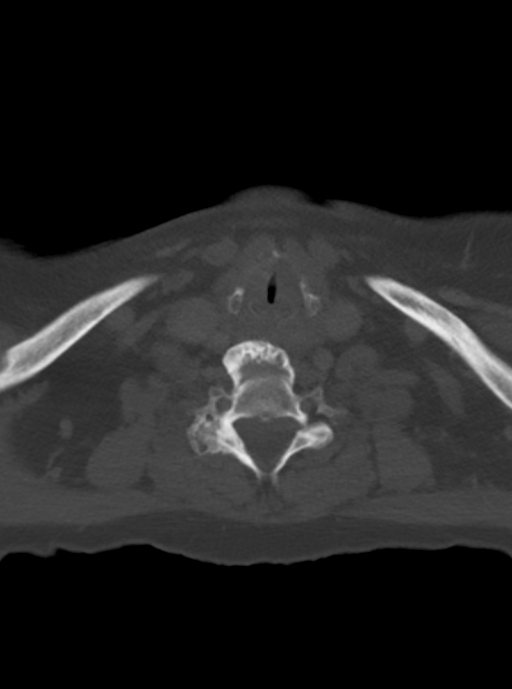
[im 81/141  bone]
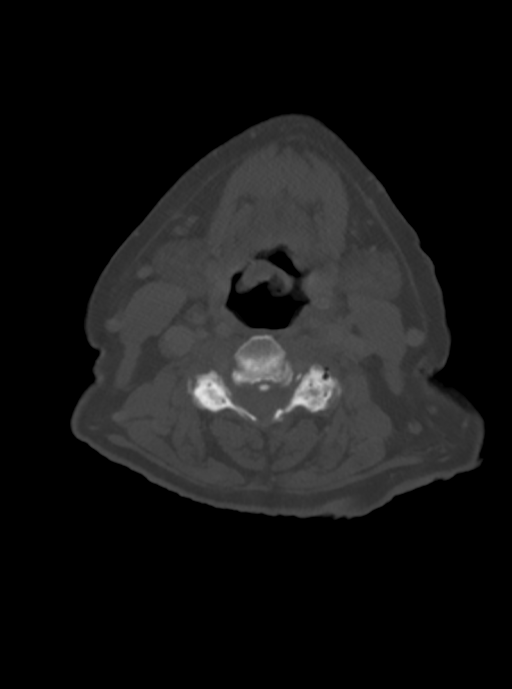
[im 121/141  bone]
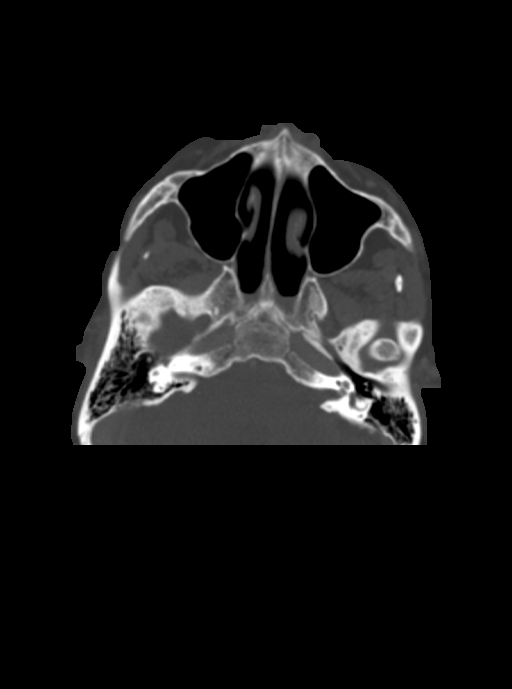

[12 of 33 positions shown; findings below may reference images not displayed]

FINDINGS: PHARYNX AND LARYNX:

--Nasopharynx: Fossae of DYP are clear. Normal adenoid
tonsils for age.

--Oral cavity and oropharynx: The palatine and lingual tonsils are
normal. The visible oral cavity and floor of mouth are normal.

--Hypopharynx: Normal vallecula and pyriform sinuses.

--Larynx: There is a large mass of the left supraglottic larynx,
measuring up to 2.1 x 2.8 x 3.1 cm. There is marked narrowing of the
laryngeal airway. The mass extends across the anterior commissure
and invades the prelaryngeal fat. No invasion through the thyroid
cartilage. Mass extends along the left aryepiglottic fold into the
left aspect of the epiglottis.

--Retropharyngeal space: No abscess, effusion or lymphadenopathy.

SALIVARY GLANDS:

--Parotid: No mass lesion or inflammation. No sialolithiasis or
ductal dilatation.

--Submandibular: Symmetric without inflammation. No sialolithiasis
or ductal dilatation.

--Sublingual: Normal. No ranula or other visible lesion of the base
of tongue and floor of mouth.

THYROID: Normal.

LYMPH NODES: Left level 2A lymph nodes measure up to 9 mm. Right
level 2A nodes also measure up to 9 mm.

VASCULAR: Major cervical vessels are patent.

LIMITED INTRACRANIAL: Normal.

VISUALIZED ORBITS: Normal.

MASTOIDS AND VISUALIZED PARANASAL SINUSES: No fluid levels or
advanced mucosal thickening. No mastoid effusion.

SKELETON: No bony spinal canal stenosis. No lytic or blastic
lesions.

UPPER CHEST: Clear.

OTHER: None.
IMPRESSION: 1. Supraglottic laryngeal carcinoma extending across the anterior
commissure and invading the prelaryngeal fat, left aryepiglottic
fold and left aspect of the epiglottis.
2. Severe narrowing of the laryngeal airway.
3. Bilateral subcentimeter level 2A and 2A cervical lymph nodes.

Critical Value/emergent results were called by telephone at the time
of interpretation on [DATE] at [DATE] to Dr. DYP, who
verbally acknowledged these results.

## 2018-12-02 IMAGING — CT CT CHEST WITH CONTRAST
2 of 3 series · 15 of 36 positions shown, 18 images · IV contrast (omnipaque)
Comparison: None.

CLINICAL DATA: Difficulty breathing, stridor, laryngeal spasm x2
months. Abnormal CT neck.

EXAM:
CT CHEST WITH CONTRAST
TECHNIQUE: Multidetector CT imaging of the chest was performed during
intravenous contrast administration.
CONTRAST:  75mL OMNIPAQUE IOHEXOL 300 MG/ML  SOLN

[Series 3: axial st · axial · 0.74mm/px · z∈[-431,-201]mm · 12 of 54 slices shown, 15 images]
[im 4/54  mediastinal]
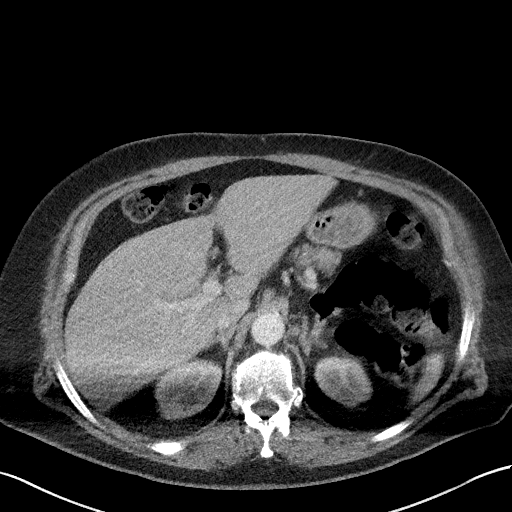
[im 4/54  lung]
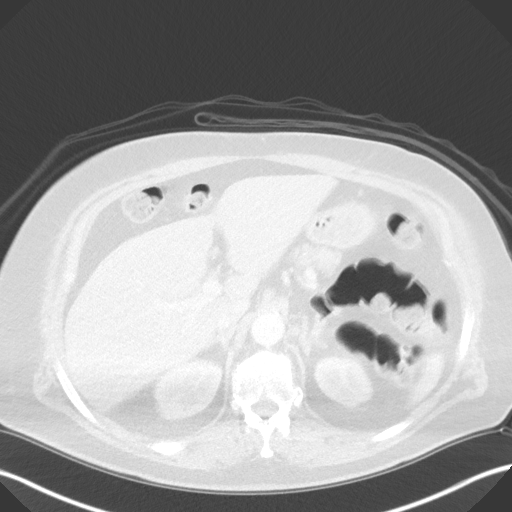
[im 8/54  lung]
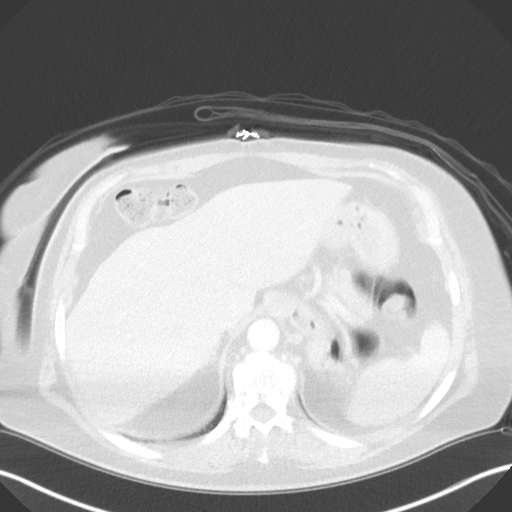
[im 12/54  lung]
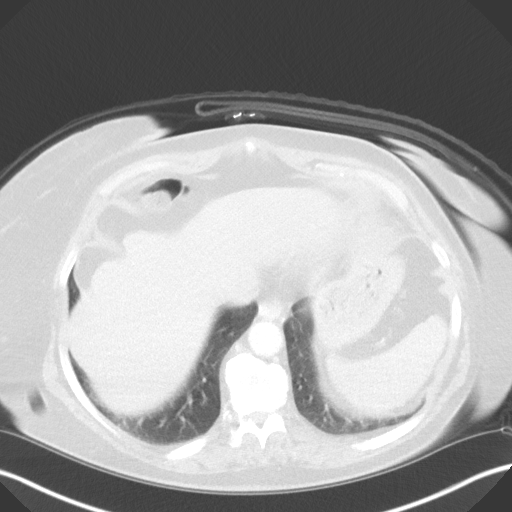
[im 16/54  lung]
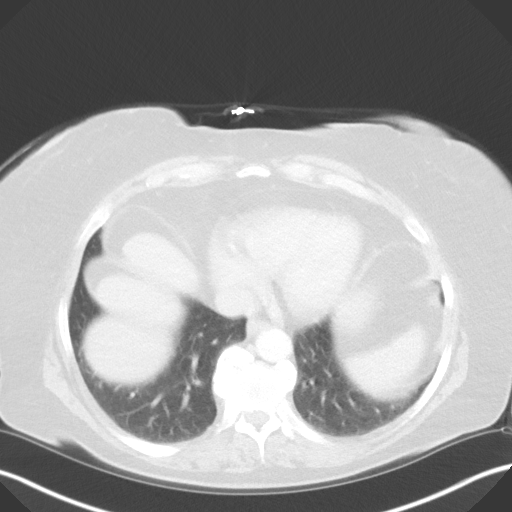
[im 20/54  mediastinal]
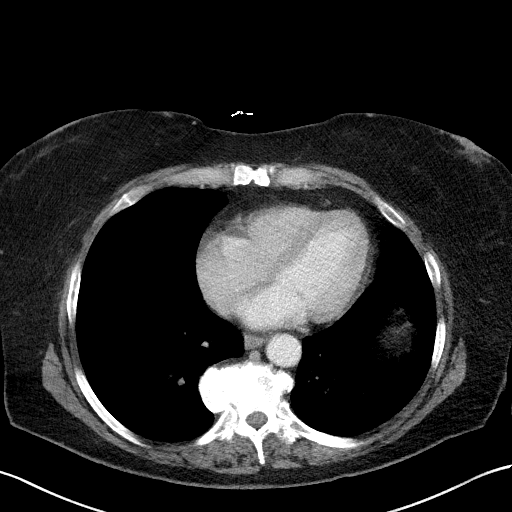
[im 20/54  lung]
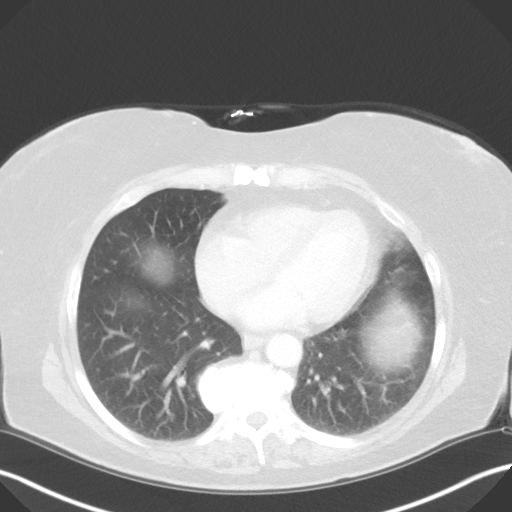
[im 24/54  lung]
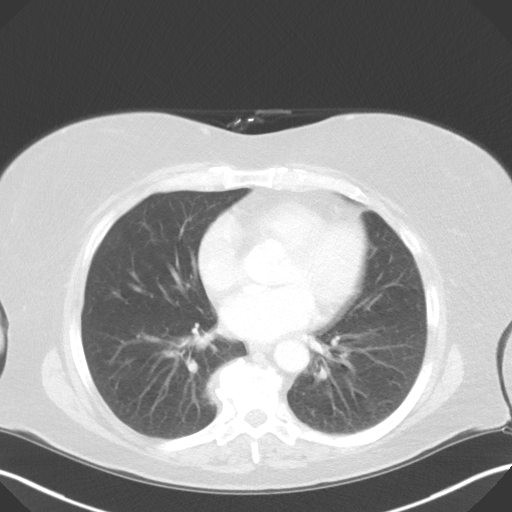
[im 30/54  lung]
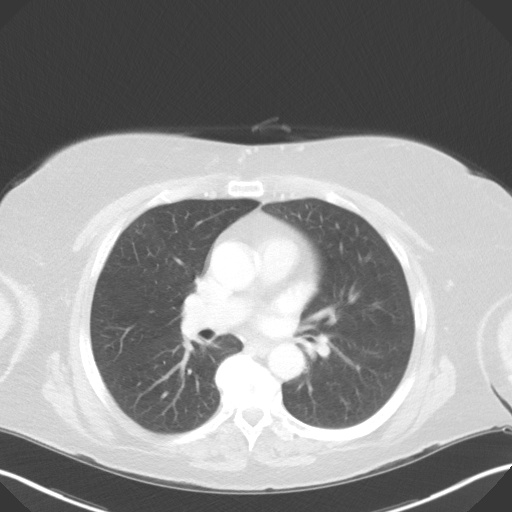
[im 34/54  lung]
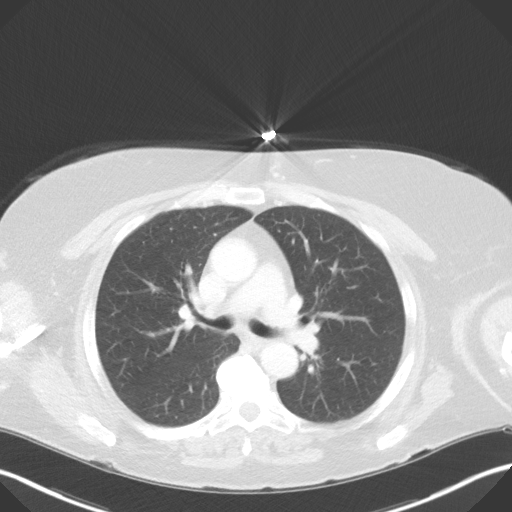
[im 38/54  mediastinal]
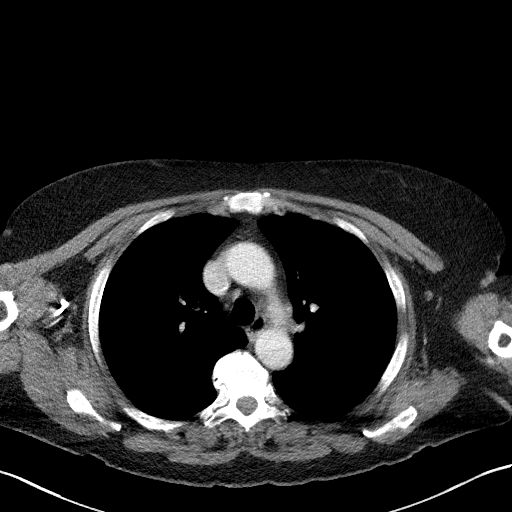
[im 38/54  lung]
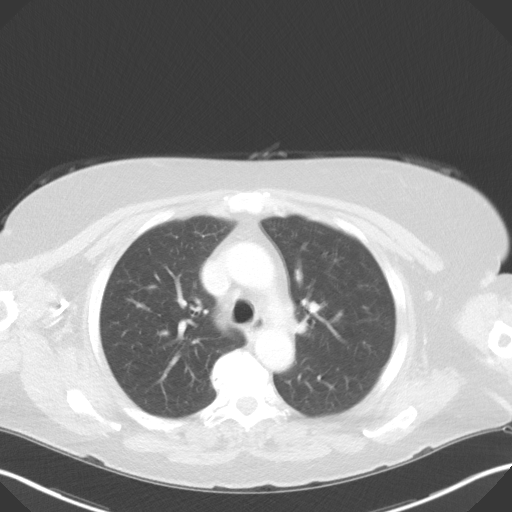
[im 42/54  lung]
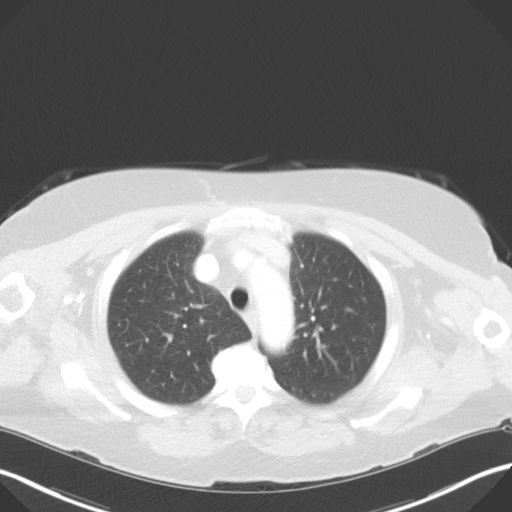
[im 46/54  lung]
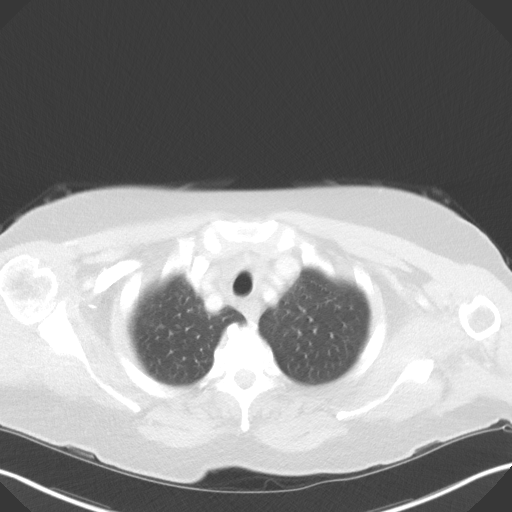
[im 50/54  lung]
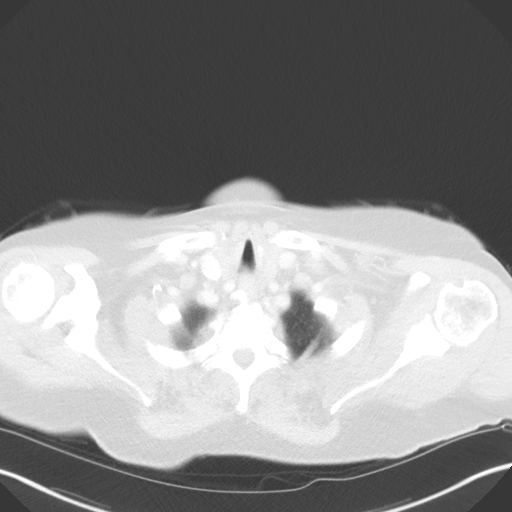

[Series 6: coronal · coronal · 0.59mm/px · 3 of 135 slices shown]
[im 27/135  lung]
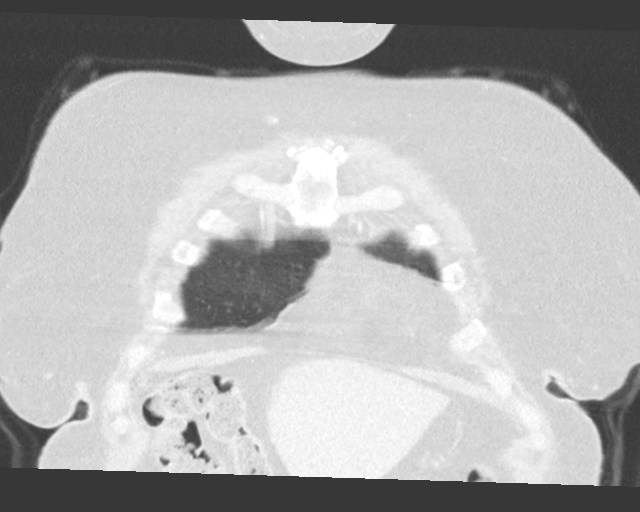
[im 54/135  lung]
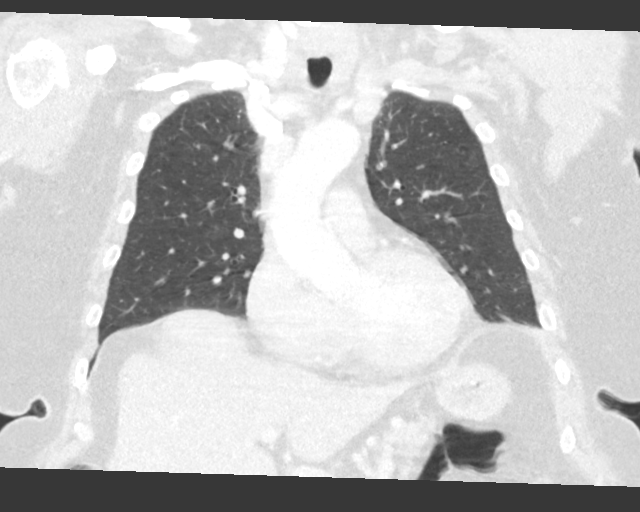
[im 81/135  lung]
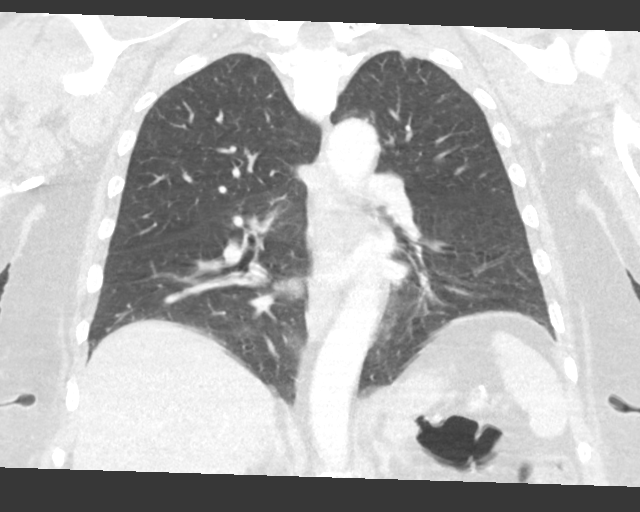

[15 of 36 positions shown; findings below may reference images not displayed]

FINDINGS: Cardiovascular: Heart is normal in size.  No pericardial effusion.

No evidence thoracic aortic aneurysm.

Mediastinum/Nodes: No suspicious mediastinal, hilar, or axillary
lymphadenopathy.

Lungs/Pleura: Evaluation of the lung parenchyma is constrained by
respiratory motion. Within that constraint, there are no suspicious
pulmonary nodules.

No focal consolidation.

No pleural effusion or pneumothorax.

Upper Abdomen: Visualized upper abdomen is grossly unremarkable.

Musculoskeletal: Degenerative changes of the visualized
thoracolumbar spine.
IMPRESSION: No evidence of metastatic disease in the chest.

## 2018-12-02 MED ORDER — IOHEXOL 300 MG/ML  SOLN
75.0000 mL | Freq: Once | INTRAMUSCULAR | Status: AC | PRN
Start: 2018-12-02 — End: 2018-12-02
  Administered 2018-12-02: 75 mL via INTRAVENOUS

## 2018-12-03 DIAGNOSIS — Z720 Tobacco use: Secondary | ICD-10-CM | POA: Insufficient documentation

## 2018-12-03 DIAGNOSIS — J9602 Acute respiratory failure with hypercapnia: Secondary | ICD-10-CM | POA: Insufficient documentation

## 2018-12-03 DIAGNOSIS — I21A1 Myocardial infarction type 2: Secondary | ICD-10-CM | POA: Insufficient documentation

## 2018-12-03 DIAGNOSIS — E039 Hypothyroidism, unspecified: Secondary | ICD-10-CM | POA: Insufficient documentation

## 2018-12-03 DIAGNOSIS — J387 Other diseases of larynx: Secondary | ICD-10-CM | POA: Insufficient documentation

## 2018-12-03 DIAGNOSIS — J9601 Acute respiratory failure with hypoxia: Secondary | ICD-10-CM | POA: Insufficient documentation

## 2018-12-09 DIAGNOSIS — Z7901 Long term (current) use of anticoagulants: Secondary | ICD-10-CM | POA: Diagnosis not present

## 2018-12-09 DIAGNOSIS — Z79899 Other long term (current) drug therapy: Secondary | ICD-10-CM | POA: Diagnosis not present

## 2018-12-09 DIAGNOSIS — E039 Hypothyroidism, unspecified: Secondary | ICD-10-CM | POA: Diagnosis not present

## 2018-12-09 DIAGNOSIS — E43 Unspecified severe protein-calorie malnutrition: Secondary | ICD-10-CM | POA: Diagnosis not present

## 2018-12-09 DIAGNOSIS — R1313 Dysphagia, pharyngeal phase: Secondary | ICD-10-CM | POA: Diagnosis not present

## 2018-12-09 DIAGNOSIS — Z431 Encounter for attention to gastrostomy: Secondary | ICD-10-CM | POA: Diagnosis not present

## 2018-12-09 DIAGNOSIS — Z430 Encounter for attention to tracheostomy: Secondary | ICD-10-CM | POA: Diagnosis not present

## 2018-12-09 DIAGNOSIS — R69 Illness, unspecified: Secondary | ICD-10-CM | POA: Diagnosis not present

## 2018-12-09 DIAGNOSIS — C321 Malignant neoplasm of supraglottis: Secondary | ICD-10-CM | POA: Diagnosis not present

## 2018-12-09 DIAGNOSIS — I252 Old myocardial infarction: Secondary | ICD-10-CM | POA: Diagnosis not present

## 2018-12-11 DIAGNOSIS — C321 Malignant neoplasm of supraglottis: Secondary | ICD-10-CM | POA: Diagnosis not present

## 2018-12-11 DIAGNOSIS — Z79899 Other long term (current) drug therapy: Secondary | ICD-10-CM | POA: Diagnosis not present

## 2018-12-11 DIAGNOSIS — Z431 Encounter for attention to gastrostomy: Secondary | ICD-10-CM | POA: Diagnosis not present

## 2018-12-11 DIAGNOSIS — E039 Hypothyroidism, unspecified: Secondary | ICD-10-CM | POA: Diagnosis not present

## 2018-12-11 DIAGNOSIS — I252 Old myocardial infarction: Secondary | ICD-10-CM | POA: Diagnosis not present

## 2018-12-11 DIAGNOSIS — E43 Unspecified severe protein-calorie malnutrition: Secondary | ICD-10-CM | POA: Diagnosis not present

## 2018-12-11 DIAGNOSIS — Z430 Encounter for attention to tracheostomy: Secondary | ICD-10-CM | POA: Diagnosis not present

## 2018-12-11 DIAGNOSIS — Z7901 Long term (current) use of anticoagulants: Secondary | ICD-10-CM | POA: Diagnosis not present

## 2018-12-11 DIAGNOSIS — R1313 Dysphagia, pharyngeal phase: Secondary | ICD-10-CM | POA: Diagnosis not present

## 2018-12-11 DIAGNOSIS — R69 Illness, unspecified: Secondary | ICD-10-CM | POA: Diagnosis not present

## 2018-12-14 DIAGNOSIS — Z431 Encounter for attention to gastrostomy: Secondary | ICD-10-CM | POA: Diagnosis not present

## 2018-12-14 DIAGNOSIS — Z430 Encounter for attention to tracheostomy: Secondary | ICD-10-CM | POA: Diagnosis not present

## 2018-12-14 DIAGNOSIS — I252 Old myocardial infarction: Secondary | ICD-10-CM | POA: Diagnosis not present

## 2018-12-14 DIAGNOSIS — Z79899 Other long term (current) drug therapy: Secondary | ICD-10-CM | POA: Diagnosis not present

## 2018-12-14 DIAGNOSIS — E43 Unspecified severe protein-calorie malnutrition: Secondary | ICD-10-CM | POA: Diagnosis not present

## 2018-12-14 DIAGNOSIS — E039 Hypothyroidism, unspecified: Secondary | ICD-10-CM | POA: Diagnosis not present

## 2018-12-14 DIAGNOSIS — R1313 Dysphagia, pharyngeal phase: Secondary | ICD-10-CM | POA: Diagnosis not present

## 2018-12-14 DIAGNOSIS — C321 Malignant neoplasm of supraglottis: Secondary | ICD-10-CM | POA: Diagnosis not present

## 2018-12-14 DIAGNOSIS — R69 Illness, unspecified: Secondary | ICD-10-CM | POA: Diagnosis not present

## 2018-12-14 DIAGNOSIS — Z7901 Long term (current) use of anticoagulants: Secondary | ICD-10-CM | POA: Diagnosis not present

## 2018-12-18 DIAGNOSIS — J387 Other diseases of larynx: Secondary | ICD-10-CM | POA: Diagnosis not present

## 2018-12-18 DIAGNOSIS — Z87891 Personal history of nicotine dependence: Secondary | ICD-10-CM | POA: Diagnosis not present

## 2018-12-18 DIAGNOSIS — R221 Localized swelling, mass and lump, neck: Secondary | ICD-10-CM | POA: Diagnosis not present

## 2018-12-24 DIAGNOSIS — R1313 Dysphagia, pharyngeal phase: Secondary | ICD-10-CM | POA: Diagnosis not present

## 2018-12-24 DIAGNOSIS — I252 Old myocardial infarction: Secondary | ICD-10-CM | POA: Diagnosis not present

## 2018-12-24 DIAGNOSIS — E039 Hypothyroidism, unspecified: Secondary | ICD-10-CM | POA: Diagnosis not present

## 2018-12-24 DIAGNOSIS — C321 Malignant neoplasm of supraglottis: Secondary | ICD-10-CM | POA: Diagnosis not present

## 2018-12-24 DIAGNOSIS — Z79899 Other long term (current) drug therapy: Secondary | ICD-10-CM | POA: Diagnosis not present

## 2018-12-24 DIAGNOSIS — Z7901 Long term (current) use of anticoagulants: Secondary | ICD-10-CM | POA: Diagnosis not present

## 2018-12-24 DIAGNOSIS — Z431 Encounter for attention to gastrostomy: Secondary | ICD-10-CM | POA: Diagnosis not present

## 2018-12-24 DIAGNOSIS — Z430 Encounter for attention to tracheostomy: Secondary | ICD-10-CM | POA: Diagnosis not present

## 2018-12-24 DIAGNOSIS — E43 Unspecified severe protein-calorie malnutrition: Secondary | ICD-10-CM | POA: Diagnosis not present

## 2018-12-24 DIAGNOSIS — R69 Illness, unspecified: Secondary | ICD-10-CM | POA: Diagnosis not present

## 2018-12-25 DIAGNOSIS — J387 Other diseases of larynx: Secondary | ICD-10-CM | POA: Diagnosis not present

## 2018-12-25 DIAGNOSIS — Z430 Encounter for attention to tracheostomy: Secondary | ICD-10-CM | POA: Diagnosis not present

## 2018-12-25 DIAGNOSIS — J383 Other diseases of vocal cords: Secondary | ICD-10-CM | POA: Diagnosis not present

## 2018-12-31 DIAGNOSIS — Z87891 Personal history of nicotine dependence: Secondary | ICD-10-CM | POA: Diagnosis not present

## 2018-12-31 DIAGNOSIS — J387 Other diseases of larynx: Secondary | ICD-10-CM | POA: Diagnosis not present

## 2018-12-31 DIAGNOSIS — C329 Malignant neoplasm of larynx, unspecified: Secondary | ICD-10-CM | POA: Diagnosis not present

## 2018-12-31 DIAGNOSIS — C321 Malignant neoplasm of supraglottis: Secondary | ICD-10-CM | POA: Diagnosis not present

## 2019-01-04 DIAGNOSIS — R69 Illness, unspecified: Secondary | ICD-10-CM | POA: Diagnosis not present

## 2019-01-04 DIAGNOSIS — E039 Hypothyroidism, unspecified: Secondary | ICD-10-CM | POA: Diagnosis not present

## 2019-01-04 DIAGNOSIS — E43 Unspecified severe protein-calorie malnutrition: Secondary | ICD-10-CM | POA: Diagnosis not present

## 2019-01-04 DIAGNOSIS — Z79899 Other long term (current) drug therapy: Secondary | ICD-10-CM | POA: Diagnosis not present

## 2019-01-04 DIAGNOSIS — R1313 Dysphagia, pharyngeal phase: Secondary | ICD-10-CM | POA: Diagnosis not present

## 2019-01-04 DIAGNOSIS — I252 Old myocardial infarction: Secondary | ICD-10-CM | POA: Diagnosis not present

## 2019-01-04 DIAGNOSIS — C321 Malignant neoplasm of supraglottis: Secondary | ICD-10-CM | POA: Diagnosis not present

## 2019-01-04 DIAGNOSIS — Z431 Encounter for attention to gastrostomy: Secondary | ICD-10-CM | POA: Diagnosis not present

## 2019-01-04 DIAGNOSIS — Z430 Encounter for attention to tracheostomy: Secondary | ICD-10-CM | POA: Diagnosis not present

## 2019-01-04 DIAGNOSIS — Z7901 Long term (current) use of anticoagulants: Secondary | ICD-10-CM | POA: Diagnosis not present

## 2019-01-07 DIAGNOSIS — J383 Other diseases of vocal cords: Secondary | ICD-10-CM | POA: Diagnosis not present

## 2019-01-07 DIAGNOSIS — J387 Other diseases of larynx: Secondary | ICD-10-CM | POA: Diagnosis not present

## 2019-01-07 DIAGNOSIS — Z430 Encounter for attention to tracheostomy: Secondary | ICD-10-CM | POA: Diagnosis not present

## 2019-01-11 DIAGNOSIS — R1313 Dysphagia, pharyngeal phase: Secondary | ICD-10-CM | POA: Diagnosis not present

## 2019-01-11 DIAGNOSIS — C321 Malignant neoplasm of supraglottis: Secondary | ICD-10-CM | POA: Diagnosis not present

## 2019-01-11 DIAGNOSIS — Z430 Encounter for attention to tracheostomy: Secondary | ICD-10-CM | POA: Diagnosis not present

## 2019-01-11 DIAGNOSIS — Z79899 Other long term (current) drug therapy: Secondary | ICD-10-CM | POA: Diagnosis not present

## 2019-01-11 DIAGNOSIS — E039 Hypothyroidism, unspecified: Secondary | ICD-10-CM | POA: Diagnosis not present

## 2019-01-11 DIAGNOSIS — I252 Old myocardial infarction: Secondary | ICD-10-CM | POA: Diagnosis not present

## 2019-01-11 DIAGNOSIS — E43 Unspecified severe protein-calorie malnutrition: Secondary | ICD-10-CM | POA: Diagnosis not present

## 2019-01-11 DIAGNOSIS — J383 Other diseases of vocal cords: Secondary | ICD-10-CM | POA: Diagnosis not present

## 2019-01-11 DIAGNOSIS — R69 Illness, unspecified: Secondary | ICD-10-CM | POA: Diagnosis not present

## 2019-01-11 DIAGNOSIS — Z431 Encounter for attention to gastrostomy: Secondary | ICD-10-CM | POA: Diagnosis not present

## 2019-01-11 DIAGNOSIS — Z7901 Long term (current) use of anticoagulants: Secondary | ICD-10-CM | POA: Diagnosis not present

## 2019-01-11 DIAGNOSIS — J387 Other diseases of larynx: Secondary | ICD-10-CM | POA: Diagnosis not present

## 2019-01-14 ENCOUNTER — Telehealth: Payer: Self-pay | Admitting: Hematology

## 2019-01-14 NOTE — Telephone Encounter (Signed)
Received a call from [place], new patient coordinator at [facility], to schedule a med/radonc appt for [name] [name]. Pt has been scheduled to see Dr. Maylon Peppers on 4/29 at [place] will notify the pt of the appt date and time. I gave her our main number, [phone], to have the operator transfer her to radonc. Aware to inform [name] [name] to arrive 15 minutes early as well as provided the address to our facility.

## 2019-01-18 ENCOUNTER — Other Ambulatory Visit: Payer: Self-pay | Admitting: Hematology

## 2019-01-18 DIAGNOSIS — C329 Malignant neoplasm of larynx, unspecified: Secondary | ICD-10-CM

## 2019-01-18 DIAGNOSIS — C321 Malignant neoplasm of supraglottis: Secondary | ICD-10-CM | POA: Insufficient documentation

## 2019-01-18 NOTE — Progress Notes (Signed)
[name] [name] CONSULT NOTE  Patient Care Team: [name] Pali, NP as PCP - General (Nurse Practitioner)  HEME/ONC OVERVIEW: 1. Squamous cell carcinoma of the supraglottic larynx, stage TBD -11/2018:   CT neck showed a 3.1 x 2.8cm left supraglottic laryngeal mass extending across the anterior commissure and invading the prelaryngeal fat, no invasion through the thyroid cartilage. Suspicious <1cm bilateral Level II cervical LN's.   Transferred to [facility] for airway compromise and taken to OR for tracheostomy, bx of the supraglottic lesion did not show any malignancy  Bx of the left cervical LN was non-diagnostic -12/2018: repeat DL with bx of the supraglottic mass showed well-differentiated invasive squamous cell carcinoma   TREATMENT REGIMEN:  12/03/2018: tracheostomy, DL and bx at [facility]   Systemic therapy TBD   ASSESSMENT & PLAN:   Squamous cell carcinoma of the supraglottic larynx, stage TBD -I reviewed the patient's records in detail, including ENT clinic notes, lab studies, imaging results, and the pathology reports -I also independently reviewed the radiologic images of CT neck and the chest in 11/2018, and agree with findings as documented -In summary, CT neck in early 11/2018 showed large left supraglottic laryngeal mass extending across the anterior commissure and invading the pre-laryngeal fat.  There was no invasion through the thyroid cartilage.  There was suspicious <1cm bilateral Level II cervical LN's.  She was transferred to [facility] in late 11/2018 for evaluation of airway compromise, and underwent tracheostomy.  The biopsy of the supraglottic lesion did not show any malignancy, and the subsequent biopsy of left cervical lymph node was also nondiagnostic.  Patient finally underwent repeat DL with biopsy of the supraglottic mass, which showed well-differentiated invasive squamous cell carcinoma. -I discussed the imaging and the pathology results at length with the patient -I  also reviewed the NCCN guideline in detail with the patient -Given the locally advanced supraglottic laryngeal mass invading the prelaryngeal fat and suspicious bilateral cervical lymph node, it is important to rule out any metastatic disease first before discussing treatment options -Therefore, I have ordered PET scan today; I discussed the case with the H&N navigator, who will assist with expediting the scan scheduling -Pending the PET scan results, we will discuss the treatment approach   Dysphagia with coughing -Given the laryngeal cancer with obstruction, the symptoms of coughing after eating raise suspicion for aspiration -I have ordered swallow study, to be coordinated by H&N navigator   Orders Placed This Encounter  Procedures  . NM PET Image Initial (PI) Skull Base To Thigh    Standing Status:   Future    Standing Expiration Date:   01/20/2020    Order Specific Question:   If indicated for the ordered procedure, I authorize the administration of a radiopharmaceutical per Radiology protocol    Answer:   Yes    Order Specific Question:   Preferred imaging location?    Answer:   [facility]    Order Specific Question:   Radiology Contrast Protocol - do NOT remove file path    Answer:   [email or web address]  . DG SWALLOW FUNC SPEECH PATH    Standing Status:   Future    Standing Expiration Date:   03/21/2020    Order Specific Question:   Reason for Exam (SYMPTOM  OR DIAGNOSIS REQUIRED)    Answer:   Coughing, possible aspiration    Order Specific Question:   Where should this test be performed?    Answer:   [facility]   All  questions were answered. The patient knows to call the clinic with any problems, questions or concerns.  Return tentatively on 01/27/2019 to follow up PET scan results and to determine the treatment plan.   Tish Men, MD 01/20/2019 10:52 AM   CHIEF COMPLAINTS/PURPOSE OF CONSULTATION:  "I am doing okay"  HISTORY OF PRESENTING  ILLNESS:  [name] [name] 68 y.o. female is here because of newly diagnosed squamous cell carcinoma of the supraglottic larynx.  Patient has some difficulty speaking through the tracheostomy, and most of the information was obtained from her partner [name] [name] over the phone.  Patient first developed some neck soreness, followed by left-sided tooth pain in early 2020.  During this time, she also developed progressive shortness of breath, for which he presented to [facility] for further evaluation.  She was intubated in the ER there, and transferred to [facility] for further management.  At [facility], she underwent emergent tracheostomy placement and biopsy of the supraglottic lesion.  Postop, she recovered well and was discharged home a few days after the procedure.  Unfortunately, the biopsy was nondiagnostic, and she underwent FNA of a cervical lymph node, which was also non-diagnostic.  Finally, she underwent repeat laryngoscopy with biopsy of the supraglottic lesion, which showed well differentiated invasive squamous of carcinoma.  Patient reports that since tracheostomy placement, she has been able to breathe much better.  She has occasional clogging of her tracheostomy, but overall it appears to work relatively well.  She has intermittent coughing with clear sputum production, but denies any hemoptysis or change in sputum color/quantity.  She has mild dysphasia with occasional coughing after eating.  She has lost about 25 to 30 pounds over the past 3 to 4 months.  She denies any other complaint today.  I have reviewed her chart and materials related to her cancer extensively and collaborated history with the patient. Summary of oncologic history is as follows:   Laryngeal cancer ([place])   12/02/2018 Imaging    CT neck: IMPRESSION: 1. Supraglottic laryngeal carcinoma extending across the anterior commissure and invading the prelaryngeal fat, left aryepiglottic fold and left aspect of the epiglottis. 2. Severe  narrowing of the laryngeal airway. 3. Bilateral subcentimeter level 2A and 2A cervical lymph nodes.  Critical Value/emergent results were called by telephone at the time of interpretation on 12/02/2018 at 5:34 pm to Dr. Collier Salina, who verbally acknowledged these results.    12/02/2018 Imaging    CT  Chest:  IMPRESSION: No evidence of metastatic disease in the chest.    12/03/2018 Pathology Results    (Care Everywhere; [facility]) A: Larynx, supraglottis, biopsy - Atypical squamous proliferation with at least low grade dysplasia and abundant keratinization - No definite high grade dysplasia or invasive carcinoma represented (see comment)    12/18/2018 Pathology Results    (Care Everywhere; [facility]) A:  Neck, left, fine needle aspiration - Rare single atypical cells, no cells diagnostic of malignancy - Lymph node material present  B:  Neck, right, fine needle aspiration - Non-diagnostic specimen - No lymph node material identified - Mixed inflammation and blood    12/31/2018 Pathology Results    (Care Everywhere; [facility])  A: Larynx, left supraglottic mass, biopsy - Atypical squamous proliferation with abundant keratinization, suspicious for well-differentiated invasive squamous cell carcinoma  B: Larynx, left supraglottic mass, biopsy - Well-differentiated invasive squamous cell carcinoma with abundant keratinization (see comment)    01/18/2019 Initial Diagnosis    Laryngeal cancer [facility])     MEDICAL  HISTORY:  Past Medical History:  Diagnosis Date  . Cataract   . Laryngeal cancer ([place]) 11/2018    SURGICAL HISTORY: Past Surgical History:  Procedure Laterality Date  . CATARACT EXTRACTION, BILATERAL  left 05/20/16, right 06/03/16   Cataract Extraction w/ IOL Implant; Surgeon: Sudie Grumbling, MD; Location: OR [place]; Service: Ophthalmology   . CESAREAN SECTION    . TRACHEOSTOMY  12/02/2018   [facility]    SOCIAL HISTORY: Social History    Socioeconomic History  . Marital status: Single    Spouse name: Not on file  . Number of children: Not on file  . Years of education: Not on file  . Highest education level: Not on file  Occupational History  . Not on file  Social Needs  . Financial resource strain: Not on file  . Food insecurity:    Worry: Not on file    Inability: Not on file  . Transportation needs:    Medical: Not on file    Non-medical: Not on file  Tobacco Use  . Smoking status: Former Research scientist (life sciences)  . Smokeless tobacco: Never Used  Substance and Sexual Activity  . Alcohol use: Not Currently    Frequency: Never  . Drug use: Never  . Sexual activity: Not on file  Lifestyle  . Physical activity:    Days per week: Not on file    Minutes per session: Not on file  . Stress: Not on file  Relationships  . Social connections:    Talks on phone: Not on file    Gets together: Not on file    Attends religious service: Not on file    Active member of club or organization: Not on file    Attends meetings of clubs or organizations: Not on file    Relationship status: Not on file  . Intimate partner violence:    Fear of current or ex partner: Not on file    Emotionally abused: Not on file    Physically abused: Not on file    Forced sexual activity: Not on file  Other Topics Concern  . Not on file  Social History Narrative  . Not on file    FAMILY HISTORY: Family History  Problem Relation Age of Onset  . Cancer Mother   . Heart attack Father     ALLERGIES:  has No Known Allergies.  MEDICATIONS:  Current Outpatient Medications  Medication Sig Dispense Refill  . chlorhexidine (PERIDEX) 0.12 % solution Swish 5 mL by Mouth then spit out. Do this two (2) times a day.    . ibuprofen (ADVIL) 800 MG tablet TAKE 1 (ONE) TABLET 2 TO 3 TIMES DAILY AS NEEDED KNEE PAIN    . levothyroxine (SYNTHROID) 150 MCG tablet TAKE 1 TABLET BY MOUTH EVERY OTHER DAY ALTERNATING WITH 175MCG    . levothyroxine (SYNTHROID) 175  MCG tablet TAKE 1 TABLET BY MOUTH EVERY OTHER DAY ALTERNATING WITH 150MCG    . ezetimibe (ZETIA) 10 MG tablet     . fenofibrate 160 MG tablet     . oxyCODONE (OXY IR/ROXICODONE) 5 MG immediate release tablet      No current facility-administered medications for this visit.     REVIEW OF SYSTEMS:   Constitutional: ( - ) fevers, ( - )  chills , ( - ) night sweats Eyes: ( - ) blurriness of vision, ( - ) double vision, ( - ) watery eyes Ears, nose, mouth, throat, and face: ( - ) mucositis, ( - )  sore throat Respiratory: ( + ) cough, ( - ) dyspnea, ( - ) wheezes Cardiovascular: ( - ) palpitation, ( - ) chest discomfort, ( - ) lower extremity swelling Gastrointestinal:  ( - ) nausea, ( - ) heartburn, ( - ) change in bowel habits Skin: ( - ) abnormal skin rashes Lymphatics: ( - ) new lymphadenopathy, ( - ) easy bruising Neurological: ( - ) numbness, ( - ) tingling, ( - ) new weaknesses Behavioral/Psych: ( - ) mood change, ( - ) new changes  All other systems were reviewed with the patient and are negative.  PHYSICAL EXAMINATION: ECOG PERFORMANCE STATUS: 1 - Symptomatic but completely ambulatory  Vitals:   01/20/19 0920  BP: 114/62  Pulse: 78  Resp: 18  Temp: 98.4 F (36.9 C)  SpO2: 99%   Filed Weights   01/20/19 0920  Weight: 165 lb (74.8 kg)    GENERAL: alert, no distress and comfortable SKIN: skin color, texture, turgor are normal, no rashes or significant lesions EYES: conjunctiva are pink and non-injected, sclera clear OROPHARYNX: no exudate, no erythema; lips, buccal mucosa, and tongue normal  NECK: supple, non-tender, tracheostomy in place  LUNGS: clear to auscultation with normal breathing effort HEART: regular rate & rhythm, no murmurs, no lower extremity edema ABDOMEN: soft, non-tender, non-distended, normal bowel sounds Musculoskeletal: no cyanosis of digits and no clubbing  PSYCH: alert & oriented x 3, whispering  NEURO: no focal motor/sensory  deficits  LABORATORY DATA:  I have reviewed the data as listed Lab Results  Component Value Date   WBC 7.4 01/20/2019   HGB 13.2 01/20/2019   HCT 42.0 01/20/2019   MCV 84.5 01/20/2019   PLT 406 (H) 01/20/2019   Lab Results  Component Value Date   NA 140 01/20/2019   K 4.3 01/20/2019   CL 103 01/20/2019   CO2 26 01/20/2019    RADIOGRAPHIC STUDIES: I have personally reviewed the radiological images as listed and agreed with the findings in the report.  PATHOLOGY: I have reviewed the pathology reports as documented in the oncologist history.

## 2019-01-19 ENCOUNTER — Encounter: Payer: Self-pay | Admitting: Radiation Oncology

## 2019-01-19 ENCOUNTER — Telehealth: Payer: Self-pay | Admitting: *Deleted

## 2019-01-19 ENCOUNTER — Encounter: Payer: Self-pay | Admitting: *Deleted

## 2019-01-19 NOTE — Telephone Encounter (Addendum)
Oncology Nurse Navigator Documentation  Placed introductory call to new referral patient Setareh Rom.  Spoke with her partner [name] who explained Ms. Visscher has been unable to speak for several weeks.  Introduced myself as the H&N oncology nurse navigator that works with Drs.  Maylon Peppers and Isidore Moos to whom Ms. Yarrow has been referred by Dr. [facility].  She confirmed understanding of referral, noted Dr. Carron Brazen, Otolaryngologist, recommended chemo/RT for recently diagnosed laryngeal tumor.  Briefly explained my role as their navigator, provided my contact information.   Confirmed understanding of tomorrow morning's 9:00 appt with Dr. Maylon Peppers preceded by 8:45 lab; 9:00 WebEx consult with Dr. Isidore Moos preceded by 8:30 phone call by RN.    I explained arrival/registration process, explained current [id number] mitigation policy restricting patient accompanyment during appts.  Ms. Selinda Eon voiced understanding, requested she be able to join by phone during consult with Dr. Maylon Peppers.  I indicated I wd forward request to Dr. Maylon Peppers.    I explained the purpose of a dental evaluation prior to starting RT, indicated they wd be contacted by [place] to arrange an appt within a day or so of appt with Dr. Isidore Moos.   Of note:   Per Ms. Selinda Eon, Ms. Wiederhold was airlifted to [place] 3/11 d/t respiratory distress, had tracheostomy 3/12.  Lurline Idol has been well managed including BID trach care by Ms. Selinda Eon and Ms. Salomone's son who is currently living with them.     I encouraged calls to me with questions/concerns as Ms. Folden moves forward with appts and procedures.    Ms. Selinda Eon verbalized understanding of information provided, expressed appreciation for my call.   Navigator Initial Assessment . Employment Status/FMLA/STD:  Retired. . Support System:  Partner Ms. Selinda Eon, pt son. Marland Kitchen PCP: Currently Charlott Holler, NP; care to be transferred to Gwenyth Allegra, NP. Marland Kitchen PCD: No. . Transportation Needs:  No . Sensory  Deficits/Language Barriers/Interpreter Needed:  No . Ambulation Needs:  No.  Mobility somewhat hampered d/t R knee (needs replacement) but able to self-ambulate without assist device. . DME Used in Home: No . Psychosocial Needs:  No . Concerns/Needs Understanding Cancer: Initial questions answered by navigator. . Self-Expressed Needs: No  Gayleen Orem, RN, BSN Head & Neck Oncology Nurse [place] [facility] [place] [phone]

## 2019-01-19 NOTE — Progress Notes (Signed)
New patient packet dropped off to Dr. Lorette Ang nurse.

## 2019-01-19 NOTE — Progress Notes (Signed)
Head and Neck Cancer Location of Tumor / Histology:  12/31/18 [facility] hospital A: Larynx, left supraglottic mass, biopsy - Atypical squamous proliferation with abundant keratinization, suspicious for well-differentiated invasive squamous cell carcinoma B: Larynx, left supraglottic mass, biopsy - Well-differentiated invasive squamous cell carcinoma with abundant keratinization   Patient presented with symptoms of: She presented on 12/02/18 to a [facility] facility with respiratory distress and stridor that started at noon the same day per EMS notes.   Biopsies of left supraglottic mass revealed: well differentiated invasive squamous cell carcinoma.   Nutrition Status Yes No Comments  Weight changes? [x]  []  She has lost about 30 lbs in the last 3 months.   Swallowing concerns? [x]  []  She eats a pureed diet  PEG? []  [x]     Referrals Yes No Comments  Social Work? []  [x]    Dentistry? []  [x]  Dr. Enrique Sack 01/25/19  Swallowing therapy? []  [x]    Nutrition? []  [x]    Med/Onc? [x]  []  Dr. Maylon Peppers 01/20/19   Safety Issues Yes No Comments  Prior radiation? []  [x]    Pacemaker/ICD? []  [x]    Possible current pregnancy? []  [x]    Is the patient on methotrexate? []  [x]     Tobacco/Marijuana/Snuff/ETOH use: She is a former smoker qutting 12/02/18, she does not drink alcohol.   Past/Anticipated interventions by otolaryngology, if any:  12/03/2018 [facility] hospital LARYNGOSCOPY, DIRECT, OPERATIVE, WITH BIOPSY ESOPHAGOSCOPY, RIGID OR FLEXIBLE; DIAGNOSTIC, W/WO COLLECTION OF SPECIMEN(S) BY BRUSHING OR WASHING TRACHEOSTOMY PLANNED (SEPART PROC)  Procedure: LARYNGOSCOPY, DIRECT, OPERATIVE, WITH BIOPSY CPT(R) Code: 19147 - PR LARYNGOSCOPY,DIRCT,OP,BIOPSY  12/31/18 [facility] Dr. Amada [place] PRIORITY LARYNGOSCOPY DIRECT, WITH OR WITHOUT TRACHEOSCOPY; DIAGNOSTIC, EXCEPT NEWBORN  Esophagoscopy, Rigid Or Flexible; Diagnostic, W/Wo Collection Of Specimen(S) By Brushing Or Washing     Past/Anticipated interventions by medical oncology, if any:   Dr. Maylon Peppers 01/20/19 She has another appointment with him on 01/27/19   Current Complaints / other details:   PET scan 5/5

## 2019-01-20 ENCOUNTER — Inpatient Hospital Stay: Payer: Medicare HMO | Attending: Hematology | Admitting: Hematology

## 2019-01-20 ENCOUNTER — Inpatient Hospital Stay: Payer: Medicare HMO

## 2019-01-20 ENCOUNTER — Ambulatory Visit
Admission: RE | Admit: 2019-01-20 | Discharge: 2019-01-20 | Disposition: A | Discharge status: Home / Self Care | Payer: Self-pay | Source: Ambulatory Visit | Attending: Radiation Oncology | Admitting: Radiation Oncology

## 2019-01-20 ENCOUNTER — Telehealth: Payer: Self-pay | Admitting: Hematology

## 2019-01-20 ENCOUNTER — Encounter: Payer: Self-pay | Admitting: Radiation Oncology

## 2019-01-20 ENCOUNTER — Other Ambulatory Visit: Payer: Self-pay

## 2019-01-20 ENCOUNTER — Other Ambulatory Visit: Payer: Self-pay | Admitting: Radiation Oncology

## 2019-01-20 ENCOUNTER — Encounter: Payer: Self-pay | Admitting: Hematology

## 2019-01-20 VITALS — BP 114/62 | HR 78 | Temp 98.4°F | Resp 18 | Wt 165.0 lb

## 2019-01-20 DIAGNOSIS — R05 Cough: Secondary | ICD-10-CM

## 2019-01-20 DIAGNOSIS — T17908A Unspecified foreign body in respiratory tract, part unspecified causing other injury, initial encounter: Secondary | ICD-10-CM

## 2019-01-20 DIAGNOSIS — C760 Malignant neoplasm of head, face and neck: Secondary | ICD-10-CM

## 2019-01-20 DIAGNOSIS — Z79899 Other long term (current) drug therapy: Secondary | ICD-10-CM | POA: Insufficient documentation

## 2019-01-20 DIAGNOSIS — C321 Malignant neoplasm of supraglottis: Secondary | ICD-10-CM | POA: Insufficient documentation

## 2019-01-20 DIAGNOSIS — R1310 Dysphagia, unspecified: Secondary | ICD-10-CM | POA: Diagnosis not present

## 2019-01-20 DIAGNOSIS — C329 Malignant neoplasm of larynx, unspecified: Secondary | ICD-10-CM

## 2019-01-20 DIAGNOSIS — R1313 Dysphagia, pharyngeal phase: Secondary | ICD-10-CM | POA: Diagnosis not present

## 2019-01-20 DIAGNOSIS — Z930 Tracheostomy status: Secondary | ICD-10-CM | POA: Diagnosis not present

## 2019-01-20 DIAGNOSIS — R69 Illness, unspecified: Secondary | ICD-10-CM | POA: Diagnosis not present

## 2019-01-20 DIAGNOSIS — E039 Hypothyroidism, unspecified: Secondary | ICD-10-CM | POA: Diagnosis not present

## 2019-01-20 DIAGNOSIS — Z7901 Long term (current) use of anticoagulants: Secondary | ICD-10-CM | POA: Diagnosis not present

## 2019-01-20 DIAGNOSIS — I252 Old myocardial infarction: Secondary | ICD-10-CM | POA: Diagnosis not present

## 2019-01-20 DIAGNOSIS — Z431 Encounter for attention to gastrostomy: Secondary | ICD-10-CM | POA: Diagnosis not present

## 2019-01-20 DIAGNOSIS — Z87891 Personal history of nicotine dependence: Secondary | ICD-10-CM

## 2019-01-20 DIAGNOSIS — E43 Unspecified severe protein-calorie malnutrition: Secondary | ICD-10-CM | POA: Diagnosis not present

## 2019-01-20 DIAGNOSIS — Z430 Encounter for attention to tracheostomy: Secondary | ICD-10-CM | POA: Diagnosis not present

## 2019-01-20 LAB — CMP (CANCER CENTER ONLY)
ALT: 23 U/L (ref 0–44)
AST: 16 U/L (ref 15–41)
Albumin: 3.7 g/dL (ref 3.5–5.0)
Alkaline Phosphatase: 114 U/L (ref 38–126)
Anion gap: 11 (ref 5–15)
BUN: 12 mg/dL (ref 8–23)
CO2: 26 mmol/L (ref 22–32)
Calcium: 10.3 mg/dL (ref 8.9–10.3)
Chloride: 103 mmol/L (ref 98–111)
Creatinine: 0.78 mg/dL (ref 0.44–1.00)
GFR, Est AFR Am: >60 mL/min (ref >60)
GFR, Estimated: >60 mL/min (ref >60)
Glucose, Bld: 94 mg/dL (ref 70–99)
Potassium: 4.3 mmol/L (ref 3.5–5.1)
Sodium: 140 mmol/L (ref 135–145)
Total Bilirubin: 0.3 mg/dL (ref 0.3–1.2)
Total Protein: 8 g/dL (ref 6.5–8.1)

## 2019-01-20 LAB — CBC WITH DIFFERENTIAL (CANCER CENTER ONLY)
Abs Immature Granulocytes: 0.02 10*3/uL (ref 0.00–0.07)
Basophils Absolute: 0 10*3/uL (ref 0.0–0.1)
Basophils Relative: 0 %
Eosinophils Absolute: 0.4 10*3/uL (ref 0.0–0.5)
Eosinophils Relative: 5 %
HCT: 42 % (ref 36.0–46.0)
Hemoglobin: 13.2 g/dL (ref 12.0–15.0)
Immature Granulocytes: 0 %
Lymphocytes Relative: 35 %
Lymphs Abs: 2.6 10*3/uL (ref 0.7–4.0)
MCH: 26.6 pg (ref 26.0–34.0)
MCHC: 31.4 g/dL (ref 30.0–36.0)
MCV: 84.5 fL (ref 80.0–100.0)
Monocytes Absolute: 0.7 10*3/uL (ref 0.1–1.0)
Monocytes Relative: 9 %
Neutro Abs: 3.7 10*3/uL (ref 1.7–7.7)
Neutrophils Relative %: 51 %
Platelet Count: 406 10*3/uL — ABNORMAL HIGH (ref 150–400)
RBC: 4.97 MIL/uL (ref 3.87–5.11)
RDW: 14 % (ref 11.5–15.5)
WBC Count: 7.4 10*3/uL (ref 4.0–10.5)
nRBC: 0 % (ref 0.0–0.2)

## 2019-01-20 LAB — T4, FREE: Free T4: 0.95 ng/dL (ref 0.82–1.77)

## 2019-01-20 LAB — TSH: TSH: 1.405 u[IU]/mL (ref 0.308–3.960)

## 2019-01-20 NOTE — Progress Notes (Signed)
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 female who initially developed neck soreness, followed by left-sided tooth pain, then progressive shortness of breath. She presented to the [facility] ED via EMS in respiratory distress with obvious stridor on 12/02/2018. 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 Chest CT confirmed no evidence of metastatic disease. She was intubated and then airlifted to [facility] where she underwent tracheostomy on 12/03/2018. Biopsy of larynx was obtained during tracheostomy on 12/03/2018. Pathology from the procedure revealed: atypical squamous proliferation with at least low grade dysplasia and abundant keratinization; no definite high grade dysplasia or invasive carcinoma represented.  Subsequently, the patient saw Dr. Carron Brazen at [facility] on 12/18/2018, who performed a laryngoscopy with findings of: large left-sided supraglottic mass, immobile left vocal cord, and dysplastic changes to laryngeal surface of epiglottis. Biopsy also performed at the time also returned negative for high-grade dysplasia or carcinoma.  She proceeded to another laryngoscopy with biopsy  under anesthesia on 12/31/2018. This pathology revealed well-differentiated invasive squamous cell carcinoma with abundant keratinization, no lymphovascular or perineural invasion identified.  Pertinent imaging thus far includes staging chest CT performed on 12/05/2018 revealing no definite evidence of intrathoracic metastatic disease. She met with Dr. Maylon Peppers on 01/20/2019 and is scheduled for PET scan on 01/26/2019.  Swallowing issues, if any: she reports eating a pureed diet.  Weight Changes: 30 lb weight loss in the last 3 months  Pain status: denies  Tobacco history, if any: former smoker, quit following her recent hospitalization  ETOH abuse, if any: denies  Prior cancers, if any: none She has lost her voice since trach placed, "due to cancer progressing."  Was not keen on option of total laryngectomy with [facility] surgeon, Dr Carron Brazen.  She lives with [name] and partner.   PREVIOUS RADIATION THERAPY: No  PAST MEDICAL HISTORY:  has a past medical history of Cataract, Laryngeal cancer ([place]) (11/2018), and Thyroid disease.    PAST SURGICAL HISTORY: Past Surgical History:  Procedure Laterality Date  . CATARACT EXTRACTION, BILATERAL  left 05/20/16, right 06/03/16   Cataract Extraction w/ IOL Implant; Surgeon: Sudie Grumbling, MD; Location: [facility]; Service: Ophthalmology   . CESAREAN SECTION    . KNEE CARTILAGE SURGERY Right 2019  . TRACHEOSTOMY  12/02/2018   [facility]    FAMILY HISTORY: family history includes Cancer in her mother; Heart attack in her father.  SOCIAL HISTORY:  reports that she quit smoking about 7 weeks ago. Her smoking use included cigarettes. She has never used smokeless tobacco. She reports previous alcohol use. She reports that she does not use drugs.  ALLERGIES: Patient has no known allergies.  MEDICATIONS:  Current Outpatient Medications  Medication Sig Dispense Refill  . chlorhexidine (PERIDEX) 0.12 % solution  Swish 5 mL by Mouth then spit out. Do this  two (2) times a day.    . docusate sodium (COLACE) 100 MG capsule Take 100 mg by mouth 2 (two) times daily.    Marland Kitchen ibuprofen (ADVIL) 800 MG tablet TAKE 1 (ONE) TABLET 2 TO 3 TIMES DAILY AS NEEDED KNEE PAIN    . levothyroxine (SYNTHROID) 150 MCG tablet TAKE 1 TABLET BY MOUTH EVERY OTHER DAY ALTERNATING WITH 175MCG    . levothyroxine (SYNTHROID) 175 MCG tablet TAKE 1 TABLET BY MOUTH EVERY OTHER DAY ALTERNATING WITH 150MCG    . ezetimibe (ZETIA) 10 MG tablet     . fenofibrate 160 MG tablet     . oxyCODONE (OXY IR/ROXICODONE) 5 MG immediate release tablet      No current facility-administered medications for this encounter.     REVIEW OF SYSTEMS:  Notable for that above.   PHYSICAL EXAM:  vitals were not taken for this visit.    NAD, cannot speak. Clean trach, intact  LABORATORY DATA:  Lab Results  Component Value Date   WBC 7.4 01/20/2019   HGB 13.2 01/20/2019   HCT 42.0 01/20/2019   MCV 84.5 01/20/2019   PLT 406 (H) 01/20/2019   CMP     Component Value Date/Time   NA 140 01/20/2019 0904   K 4.3 01/20/2019 0904   CL 103 01/20/2019 0904   CO2 26 01/20/2019 0904   GLUCOSE 94 01/20/2019 0904   BUN 12 01/20/2019 0904   CREATININE 0.78 01/20/2019 0904   CALCIUM 10.3 01/20/2019 0904   PROT 8.0 01/20/2019 0904   ALBUMIN 3.7 01/20/2019 0904   AST 16 01/20/2019 0904   ALT 23 01/20/2019 0904   ALKPHOS 114 01/20/2019 0904   BILITOT 0.3 01/20/2019 0904   GFRNONAA >60 01/20/2019 0904   GFRAA >60 01/20/2019 0904      Lab Results  Component Value Date   TSH 1.405 01/20/2019     RADIOGRAPHY: as above, I personally reviewed her images    IMPRESSION/PLAN: Laryngeal Cancer  This is a delightful patient with head and neck cancer. PET scan pending. For locally advanced disease in absence of distant mets, I anticipate I will recommend radiotherapy for this patient.  We discussed the potential risks, benefits, and side effects of radiotherapy. We talked in detail about acute and late  effects. We discussed that some of the most bothersome acute effects may be mucositis, dysgeusia, salivary changes, skin irritation, hair loss, dehydration, weight loss and fatigue. We talked about late effects which include but are not necessarily limited to dysphagia, dental issues, xerostomia,  and neck edema. No guarantees of treatment were given. A consent form was signed and placed in the patient's medical record. The patient is enthusiastic about proceeding with treatment. I look forward to participating in the patient's care.    Simulation (treatment planning) will take place after PET and dentistry clearance  We also discussed that the treatment of head and neck cancer is a multidisciplinary process to maximize treatment outcomes and quality of life. For this reasons the following referrals have been or will be made:  Medical oncology to discuss chemotherapy. She met with Dr. Maylon Peppers on 01/20/2019 and will follow up on 01/27/2019 following her PET scan.  I think we can use a wait and see approach for a feeding tube, even with ChRT, as she denies dysphagia with soft diet, and her mucositis will not be in her upper throat. Also, there have been delays in scheduling feeding  tube placement during the pandemic.  Dentistry for dental evaluation, possible extractions in the radiation fields, and /or advice on reducing risk of cavities, osteoradionecrosis, or other oral issues. She is scheduled to meet with Dr. Enrique Sack on 01/25/2019.   Nutritionist for nutrition support during and after treatment.    Speech language pathology for swallowing and/or speech therapy.   Social work for social support.   This encounter was provided by telemedicine platform Webex due to pandemic precautions.  The patient has given verbal consent for this type of encounter and has been advised to only accept a meeting of this type in a secure network environment. The time spent during this encounter was 27 minutes. The  attendants for this meeting include Eppie Gibson  and Lillia Abed.  During the encounter, Eppie Gibson was located at [facility] Radiation Oncology Department.  Lillia Abed was located at home.    _________________________________________   Eppie Gibson, MD   This document serves as a record of services personally performed by Eppie Gibson, MD. It was created on her behalf by Wilburn Mylar, a trained medical scribe. The creation of this record is based on the scribe's personal observations and the provider's statements to them. This document has been checked and approved by the attending provider.

## 2019-01-20 NOTE — Telephone Encounter (Signed)
Called and scheduled appt per 4/29 los.  Left a VM of scheduled appt.

## 2019-01-21 ENCOUNTER — Telehealth: Payer: Self-pay | Admitting: *Deleted

## 2019-01-21 ENCOUNTER — Other Ambulatory Visit: Payer: Self-pay | Admitting: Hematology

## 2019-01-21 NOTE — Telephone Encounter (Signed)
Oncology Nurse Navigator Documentation  Spoke with pt's SO, [name], informed her of 5/5 PET [facility], 8:00 arrival.  Explained NPO beginning Monday midnight, can take meds Tuesday morning with water, low-carb meal Monday evening.  She voiced understanding.  Gayleen Orem, RN, BSN Head & Neck Oncology Nurse [place] at [place] [phone]

## 2019-01-21 NOTE — Progress Notes (Unsigned)
mb

## 2019-01-22 ENCOUNTER — Encounter (HOSPITAL_COMMUNITY): Payer: Medicare HMO

## 2019-01-22 ENCOUNTER — Other Ambulatory Visit: Payer: Self-pay

## 2019-01-22 ENCOUNTER — Encounter: Payer: Self-pay | Admitting: *Deleted

## 2019-01-22 ENCOUNTER — Ambulatory Visit
Admission: RE | Admit: 2019-01-22 | Discharge: 2019-01-22 | Disposition: A | Discharge status: Home / Self Care | Payer: Medicare HMO | Source: Ambulatory Visit | Attending: Radiation Oncology | Admitting: Radiation Oncology

## 2019-01-22 ENCOUNTER — Telehealth: Payer: Self-pay | Admitting: Hematology

## 2019-01-22 ENCOUNTER — Encounter: Payer: Self-pay | Admitting: Radiation Oncology

## 2019-01-22 DIAGNOSIS — C329 Malignant neoplasm of larynx, unspecified: Secondary | ICD-10-CM

## 2019-01-22 DIAGNOSIS — R69 Illness, unspecified: Secondary | ICD-10-CM | POA: Diagnosis not present

## 2019-01-22 DIAGNOSIS — C321 Malignant neoplasm of supraglottis: Secondary | ICD-10-CM

## 2019-01-22 DIAGNOSIS — Z930 Tracheostomy status: Secondary | ICD-10-CM | POA: Diagnosis not present

## 2019-01-22 HISTORY — DX: Disorder of thyroid, unspecified: E07.9

## 2019-01-22 HISTORY — DX: Unspecified cataract: H26.9

## 2019-01-22 HISTORY — DX: Malignant neoplasm of larynx, unspecified: C32.9

## 2019-01-22 NOTE — Telephone Encounter (Signed)
Spoke with relative re 5/5 nutrtion telephone visit. Per relative patient cannot talk, however someone will be available to assist her with 5/5 telephone call.

## 2019-01-22 NOTE — Progress Notes (Signed)
Dental Form with Estimates of Radiation Dose      Diagnosis:    ICD-10-CM   1. Laryngeal cancer ([place]) C32.9   2. Malignant neoplasm of supraglottis ([facility]) C32.1 Ambulatory referral to Social Work    Amb Referral to Nutrition and Diabetic E    Referral to Neuro Rehab    Prognosis: fair  Anticipated # of fractions: 35    Daily?: yes  # of weeks of radiotherapy: 7  Chemotherapy?: likely  Anticipated xerostomia:  Mild permanent   Pre-simulation needs:  Scatter protection   Simulation: ASAP    Other Notes - PET pending to r/o distant mets, though Chest CT is negative. RT very likely to occur. Please contact Eppie Gibson, MD, with patient's disposition after evaluation and/or dental treatment.

## 2019-01-25 ENCOUNTER — Encounter (HOSPITAL_COMMUNITY): Payer: Self-pay | Admitting: Dentistry

## 2019-01-25 ENCOUNTER — Other Ambulatory Visit: Payer: Self-pay

## 2019-01-25 ENCOUNTER — Ambulatory Visit (HOSPITAL_COMMUNITY): Payer: Self-pay | Admitting: Dentistry

## 2019-01-25 VITALS — BP 119/71 | HR 69 | Temp 98.5°F

## 2019-01-25 DIAGNOSIS — K029 Dental caries, unspecified: Secondary | ICD-10-CM

## 2019-01-25 DIAGNOSIS — K06010 Localized gingival recession, unspecified: Secondary | ICD-10-CM

## 2019-01-25 DIAGNOSIS — C321 Malignant neoplasm of supraglottis: Secondary | ICD-10-CM

## 2019-01-25 DIAGNOSIS — Z01818 Encounter for other preprocedural examination: Secondary | ICD-10-CM

## 2019-01-25 DIAGNOSIS — K0889 Other specified disorders of teeth and supporting structures: Secondary | ICD-10-CM

## 2019-01-25 DIAGNOSIS — F40232 Fear of other medical care: Secondary | ICD-10-CM

## 2019-01-25 DIAGNOSIS — K0530 Chronic periodontitis, unspecified: Secondary | ICD-10-CM

## 2019-01-25 DIAGNOSIS — Z930 Tracheostomy status: Secondary | ICD-10-CM

## 2019-01-25 DIAGNOSIS — K036 Deposits [accretions] on teeth: Secondary | ICD-10-CM

## 2019-01-25 DIAGNOSIS — M2632 Excessive spacing of fully erupted teeth: Secondary | ICD-10-CM

## 2019-01-25 DIAGNOSIS — K045 Chronic apical periodontitis: Secondary | ICD-10-CM

## 2019-01-25 DIAGNOSIS — M264 Malocclusion, unspecified: Secondary | ICD-10-CM

## 2019-01-25 DIAGNOSIS — K08409 Partial loss of teeth, unspecified cause, unspecified class: Secondary | ICD-10-CM

## 2019-01-25 MED ORDER — SODIUM FLUORIDE 1.1 % DT CREA: TOPICAL_CREAM | DENTAL | 99 refills | Status: DC

## 2019-01-25 MED ORDER — CHLORHEXIDINE GLUCONATE 0.12 % MT SOLN: OROMUCOSAL | 99 refills | Status: DC

## 2019-01-25 NOTE — Patient Instructions (Signed)

## 2019-01-25 NOTE — Progress Notes (Signed)
Oncology Nurse Navigator Documentation  Met with patient during WebEx initial consult with Dr. Isidore Moos.  She was accompanied by her son and partner.   . Further introduced myself as their Navigator, explained my role as a member of the Care Team.   . Reviewed contents of New Patient Information packet she received earlier this week when she met with Dr. Maylon Peppers: Carie Caddy information for physician(s), myself, other members of the Care Team. o Advance Directive information ([place] blue pamphlet with LCSW contact info).   o Fall Prevention Patient Safety Plan o Appointment Guideline o [facility] map with highlight of [place] o SLP, Nutrition and PT information sheets o Symptom Management Clinic information . Provided introductory explanation of radiation treatment including SIM planning and purpose of Aquaplast head and shoulder mask, showed them example.   . She voiced understanding of pending Nutrition, SLP and SW referrals, my coordination of 5/12 [place] telephone calls. . She denied issues with trach, agreed to referral to local ENT for trach support vs return to [facility] where it was emergently placed. . She confirmed understanding of 5/4 10:00 Dental Medicine appt, 5/5 PET [facility] with 8:30 arrival, NPO beginning Monday midnight, low-carb meal prior to NPO. . I encouraged them to contact me with questions/concerns as treatments/procedures begin.  They verbalized understanding of information provided.    Navigator Interventions  Mailing of [facility] Advanced Directives booklet.  Referral to [facility] for trach support.  Gayleen Orem, RN, BSN Head & Neck Oncology Nurse [place] at [place] [phone]

## 2019-01-25 NOTE — Progress Notes (Signed)
DENTAL CONSULTATION  Date of Consultation:  01/25/2019 Patient Name:   [name] [name] Date of Birth:   October 27, 1950 Medical Record Number: [id number]  VITALS: BP 119/71 (BP Location: Right Arm)   Pulse 69   Temp 98.5 F (36.9 C)   CHIEF COMPLAINT: The patient was referred by Dr. Isidore Moos for a dental consultation.  HPI: [name] [name] is a 68 year old female recently diagnosed with squamous cell carcinoma of the supraglottic larynx. Patient with anticipated chemoradiation therapy. Patient is now seen as part of a medically necessary pre-chemoradiation therapy dental protocol examination. Patient previously had a tracheostomy Procedure due to airway compromise at [facility] health care. This was in March 2020. The tracheostomy is still in place.  The patient currently denies acute toothaches, swellings, or abscesses. Patient was last seen in December 2019 for dental extraction. This was at Dr. Daisy Blossom office. The patient denies having any complications with that dental extraction. Patient does not seek regular dental care and only sees the dentist for emergencies. Patient denies having partial dentures. Patient does have dental phobia.   PROBLEM LIST: Patient Active Problem List   Diagnosis Date Noted  . Malignant neoplasm of supraglottis ([place]) 01/18/2019    Priority: High    PMH: Past Medical History:  Diagnosis Date  . Cataract   . Laryngeal cancer ([place]) 11/2018  . Thyroid disease     PSH: Past Surgical History:  Procedure Laterality Date  . CATARACT EXTRACTION, BILATERAL  left 05/20/16, right 06/03/16   Cataract Extraction w/ IOL Implant; Surgeon: Sudie Grumbling, MD; Location: [facility]; Service: Ophthalmology   . CESAREAN SECTION    . KNEE CARTILAGE SURGERY Right 2019  . TRACHEOSTOMY  12/02/2018   [facility]    ALLERGIES: No Known Allergies  MEDICATIONS: Current Outpatient Medications  Medication Sig Dispense Refill  . docusate sodium (COLACE) 100 MG capsule Take 100  mg by mouth 2 (two) times daily.    Marland Kitchen ezetimibe (ZETIA) 10 MG tablet     . fenofibrate 160 MG tablet     . ibuprofen (ADVIL) 800 MG tablet TAKE 1 (ONE) TABLET 2 TO 3 TIMES DAILY AS NEEDED KNEE PAIN    . levothyroxine (SYNTHROID) 150 MCG tablet TAKE 1 TABLET BY MOUTH EVERY OTHER DAY ALTERNATING WITH 175MCG    . levothyroxine (SYNTHROID) 175 MCG tablet TAKE 1 TABLET BY MOUTH EVERY OTHER DAY ALTERNATING WITH 150MCG    . oxyCODONE (OXY IR/ROXICODONE) 5 MG immediate release tablet      No current facility-administered medications for this visit.     LABS: Lab Results  Component Value Date   WBC 7.4 01/20/2019   HGB 13.2 01/20/2019   HCT 42.0 01/20/2019   MCV 84.5 01/20/2019   PLT 406 (H) 01/20/2019      Component Value Date/Time   NA 140 01/20/2019 0904   K 4.3 01/20/2019 0904   CL 103 01/20/2019 0904   CO2 26 01/20/2019 0904   GLUCOSE 94 01/20/2019 0904   BUN 12 01/20/2019 0904   CREATININE 0.78 01/20/2019 0904   CALCIUM 10.3 01/20/2019 0904   GFRNONAA >60 01/20/2019 0904   GFRAA >60 01/20/2019 0904   No results found for: INR, PROTIME No results found for: PTT  SOCIAL HISTORY: Social History   Socioeconomic History  . Marital status: Significant Other    Spouse name: [name]  . Number of children: 1  . Years of education: Not on file  . Highest education level: Not on file  Occupational History  .  Not on file  Social Needs  . Financial resource strain: Not on file  . Food insecurity:    Worry: Not on file    Inability: Not on file  . Transportation needs:    Medical: No    Non-medical: No  Tobacco Use  . Smoking status: Former Smoker    Types: Cigarettes    Last attempt to quit: 12/02/2018    Years since quitting: 0.1  . Smokeless tobacco: Never Used  Substance and Sexual Activity  . Alcohol use: Not Currently    Frequency: Never  . Drug use: Never  . Sexual activity: Not on file  Lifestyle  . Physical activity:    Days per week: Not on file     Minutes per session: Not on file  . Stress: Not on file  Relationships  . Social connections:    Talks on phone: Not on file    Gets together: Not on file    Attends religious service: Not on file    Active member of club or organization: Not on file    Attends meetings of clubs or organizations: Not on file    Relationship status: Not on file  . Intimate partner violence:    Fear of current or ex partner: Not on file    Emotionally abused: Not on file    Physically abused: Not on file    Forced sexual activity: Not on file  Other Topics Concern  . Not on file  Social History Narrative  . Not on file    FAMILY HISTORY: Family History  Problem Relation Age of Onset  . Cancer Mother   . Heart attack Father     REVIEW OF SYSTEMS: Reviewed with the patient as per History of present illness. Psych: Patient does have dental phobia.  DENTAL HISTORY: CHIEF COMPLAINT: The patient was referred by Dr. Isidore Moos for a dental consultation.  HPI: [name] [name] is a 68 year old female recently diagnosed with squamous cell carcinoma of the supraglottic larynx. Patient with anticipated chemoradiation therapy. Patient is now seen as part of a medically necessary pre-chemoradiation therapy dental protocol examination. Patient previously had a tracheostomy Procedure due to airway compromise at [facility] health care. This was in March 2020. The tracheostomy is still in place.  The patient currently denies acute toothaches, swellings, or abscesses. Patient was last seen in December 2019 for dental extraction. This was at Dr. Daisy Blossom office. The patient denies having any complications with that dental extraction. Patient does not seek regular dental care and only sees the dentist for emergencies. Patient denies having partial dentures. Patient does have dental phobia.  DENTAL EXAMINATION: GENERAL:  The patient is a well-developed, well-nourished female in no acute distress. Patient has a tracheostomy  in place. HEAD AND NECK:  Patient has a tracheostomy in place. I am unable to palpate any neck lymphadenopathy. The patient denies acute TMJ symptoms. Maximum interincisal opening is measured at 50 mm. Trismus device was fabricated today. INTRAORAL EXAM:  Patient has normal saliva. There is no evidence of oral abscess formation. DENTITION:  Patient is missing tooth numbers 1, 2, 5, 13, 15, 16, 17-19, and 30-32. PERIODONTAL:  The patient has chronic periodontitis with plaque and calculus accumulations, generalized gingival recession, generalized tooth mobility. There is moderate to severe bone loss noted. DENTAL CARIES/SUBOPTIMAL RESTORATIONS:  Multiple dental caries are noted. ENDODONTIC:  There may be incipient periapical radiolucency noted at the apex of tooth #26. CROWN AND BRIDGE: There are no crown or bridge  restorations. PROSTHODONTIC:  The patient denies having partial dentures. OCCLUSION:  The patient has a poor occlusal scheme secondary to multiple missing teeth, supra-eruption and drifting of the unopposed teeth, multiple diastemas, and lack replacement of missing teeth with dental prostheses.  RADIOGRAPHIC INTERPRETATION: An orthopantogram was taken and supplemented with a full series of dental radiographs. There are multiple missing teeth. There is supra-eruption and drifting of the unopposed teeth into the edentulous areas. Multiple diastemas are noted. There is moderate to severe bone loss noted. Radiographic calculus is noted. Dental caries are noted. There is periapical radiolucency at the apex of tooth #26.   ASSESSMENTS: 1. Squamous cell carcinoma of the supraglottic larynx 2. Pre-chemoradiation therapy dental protocol 3. Chronic apical periodontitis 4. Dental caries 5. Chronic periodontitis of bone loss 6. Accretions 7. Generalized gingival recession 8. Generalized tooth mobility 9. Multiple missing teeth 10. Multiple diastemas 11. Supra-eruption and drifting of the  unopposed teeth into the edentulous areas 12. Poor occlusal scheme and malocclusion 13. Dental phobia 14. Covid 19 precautions   PLAN/RECOMMENDATIONS: 1. I discussed the risks, benefits, and complications of various treatment options with the patient in relationship to her medical and dental conditions, anticipated chemoradiation therapy, and chemoradiation therapy side effects to include xerostomia, radiation caries, trismus, mucositis, taste changes, gum and jawbone bone changes, and risk for infection and osteoradionecrosis. We also discussed potential risk for infection from the Covid 19 pandemic. We discussed various treatment options to include no treatment, total and subtotal extractions with alveoloplasty, pre-prosthetic surgery as indicated, periodontal therapy, dental restorations, root canal therapy, crown and bridge therapy, implant therapy, and replacement of missing teeth as indicated. We also discussed the fact that none of the teeth were in the primary field of radiation therapy.  We then discussed extracting all remaining teeth prior to start of chemoradiation therapy and the delayed needed for healing before chemoradiation therapy could be started. We then discussed the ideal need to start chemoradiation therapy as soon as possible The patient then indicated she wished to proceed with start of Chemoradiation therapy as soon as possible. Patient understands that she will be referred to an oral surgeon or dentist of her choice for full mouth extractions with alveoloplasty and subsequent fabrication of upper and lower complete dentures after adequate healing 3-4 months after her radiation therapy is completed.  The patient is aware of the possibility of succumbing to a dental infection during the administration of the chemoradiation therapy but wishes to proceed without dental extractions at this time.  Patient will be prescribed PreviDent 5000 fluoride therapy for use at bedtime on the  toothbrush as instructed. Prescription was sent to [facility] in [place], [place]. No scatter protection devices and fluoride trays will be fabricated at this time. Patient is to proceed with simulation appointment with Dr. Isidore Moos upon review of the PET scan currently scheduled for tomorrow morning.   2. Discussion of findings with medical team and coordination of future medical and dental care as needed.  I spent in excess of 150 minutes during the conduct of this consultation and >50% of this time involved direct face-to-face encounter for counseling and/or coordination of the patient's care.    Lenn Cal, DDS

## 2019-01-25 NOTE — Addendum Note (Signed)
Addended by: Lenn Cal on: 01/25/2019 02:23 PM   Modules accepted: Orders

## 2019-01-26 ENCOUNTER — Inpatient Hospital Stay: Payer: Medicare HMO | Attending: Hematology | Admitting: Nutrition

## 2019-01-26 ENCOUNTER — Ambulatory Visit (HOSPITAL_COMMUNITY)
Admission: RE | Admit: 2019-01-26 | Discharge: 2019-01-26 | Disposition: A | Discharge status: Home / Self Care | Payer: Medicare HMO | Source: Ambulatory Visit | Attending: Hematology | Admitting: Hematology

## 2019-01-26 ENCOUNTER — Other Ambulatory Visit: Payer: Self-pay | Admitting: Hematology

## 2019-01-26 ENCOUNTER — Telehealth: Payer: Self-pay | Admitting: Pulmonary Disease

## 2019-01-26 DIAGNOSIS — C778 Secondary and unspecified malignant neoplasm of lymph nodes of multiple regions: Secondary | ICD-10-CM | POA: Insufficient documentation

## 2019-01-26 DIAGNOSIS — C329 Malignant neoplasm of larynx, unspecified: Secondary | ICD-10-CM | POA: Diagnosis not present

## 2019-01-26 DIAGNOSIS — Z79899 Other long term (current) drug therapy: Secondary | ICD-10-CM | POA: Insufficient documentation

## 2019-01-26 DIAGNOSIS — R1310 Dysphagia, unspecified: Secondary | ICD-10-CM | POA: Insufficient documentation

## 2019-01-26 DIAGNOSIS — C321 Malignant neoplasm of supraglottis: Secondary | ICD-10-CM | POA: Diagnosis not present

## 2019-01-26 DIAGNOSIS — I700 Atherosclerosis of aorta: Secondary | ICD-10-CM | POA: Insufficient documentation

## 2019-01-26 DIAGNOSIS — R05 Cough: Secondary | ICD-10-CM | POA: Insufficient documentation

## 2019-01-26 LAB — GLUCOSE, CAPILLARY: Glucose-Capillary: 92 mg/dL (ref 70–99)

## 2019-01-26 IMAGING — CT NUCLEAR MEDICINE PET IMAGE INITIAL (PI) SKULL BASE TO THIGH
1 of 8 series · 3 of 16 positions shown, 4 images · non-contrast
Comparison: None.

CLINICAL DATA: Initial treatment strategy for supraglottic
laryngeal cancer.

EXAM:
NUCLEAR MEDICINE PET SKULL BASE TO THIGH
TECHNIQUE: 8.1 mCi F-18 FDG was injected intravenously. Full-ring PET imaging
was performed from the skull base to thigh after the radiotracer. CT
data was obtained and used for attenuation correction and anatomic
localization.
Fasting blood glucose: 92 mg/dl

[Series 4: ct hn_sk_th 5.0 b31f · axial · 0.98mm/px · z∈[-710,+62]mm · 3 of 194 slices shown, 4 images]
[im 1/194  soft-tissue]
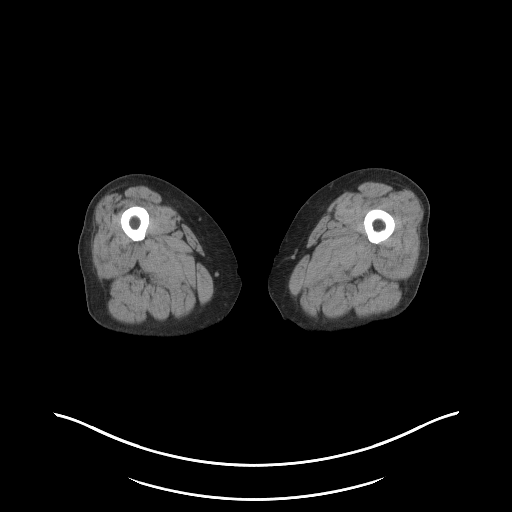
[im 1/194  bone]
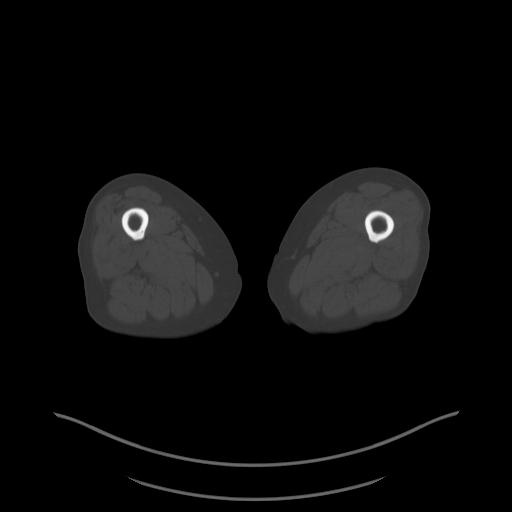
[im 97/194  soft-tissue]
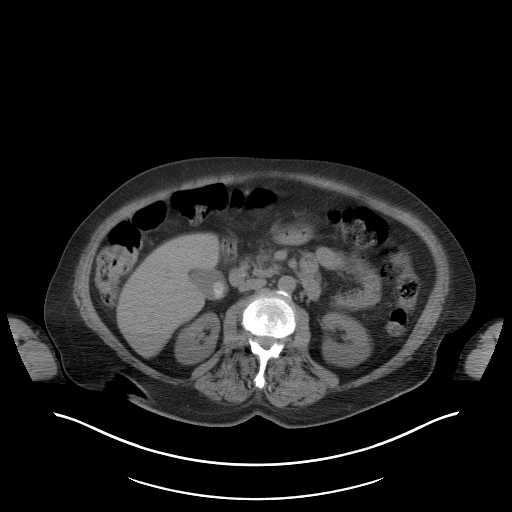
[im 194/194  soft-tissue]
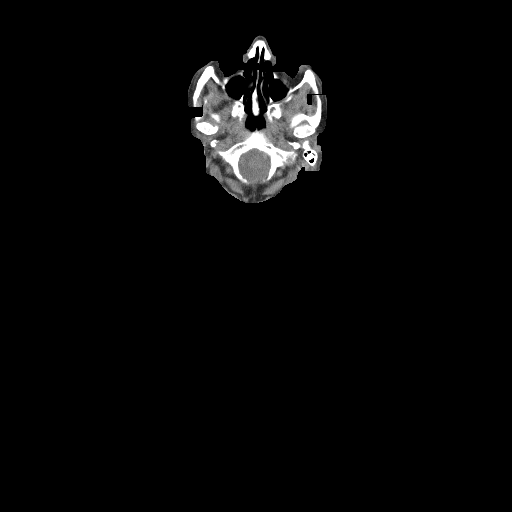

[3 of 16 positions shown; findings below may reference images not displayed]

FINDINGS: Mediastinal blood pool activity: SUV max

NECK:

There is a locally advanced hypermetabolic laryngeal mass measuring
approximately 4.4 x 4.0 cm with max SUV 15.4 centered in the left
supraglottic space, with bilateral glottic and epiglottic
involvement and with subglottic extension on the left.

Bilateral hypermetabolic level 3 lymph nodes in the neck, for
example 1.1 cm on the right with max SUV 6.3 (series 4/image 21) and
1.3 cm on the left with max SUV 5.7 (series 4/image 22).

Incidental CT findings: Tracheostomy tube terminates in tracheal
lumen at the level of the thoracic inlet.

CHEST:

There are mildly enlarged hypermetabolic high mediastinal lymph node
between the trachea and upper thoracic esophagus, measuring 1.1 cm
on the right with max SUV 3.9 (series 4/image 34) and 1.0 cm on the
left with max SUV 3.8 (series 4/image 36).

No enlarged or hypermetabolic axillary or hilar lymph nodes. No
hypermetabolic pulmonary findings.

Incidental CT findings: Atherosclerotic nonaneurysmal thoracic
aorta. Nodular 7 mm peripheral basilar left lower lobe focus (series
8/image 41), below PET resolution. No additional significant
pulmonary nodules.

ABDOMEN/PELVIS: No abnormal hypermetabolic activity within the
liver, pancreas, adrenal glands, or spleen. No hypermetabolic lymph
nodes in the abdomen or pelvis.

Nonspecific hypermetabolism at anorectal junction with max SUV 4.9,
without discrete mass on the noncontrast CT images.

Incidental CT findings: Cholelithiasis. Atherosclerotic
nonaneurysmal abdominal aorta.

SKELETON: No focal hypermetabolic activity to suggest skeletal
metastasis.

Incidental CT findings: Marked degenerative changes throughout the
spine.
IMPRESSION: 1. Locally advanced hypermetabolic laryngeal neoplasm centered in
the left supraglottic space with bilateral glottic and epiglottic
involvement and subglottic extension on the left.
2. Hypermetabolic bilateral level 3 neck nodal metastases.
3. Hypermetabolic high mediastinal nodal metastases between the
trachea and upper thoracic esophagus.
4. Otherwise no distant hypermetabolic metastatic disease.
5. Nodular 7 mm focus at the left lung base, below PET resolution,
recommend attention on follow-up chest CT in 3 months.
6. Chronic findings include: Aortic Atherosclerosis ([L6]-[L6]).
Cholelithiasis.

## 2019-01-26 MED ORDER — FLUDEOXYGLUCOSE F - 18 (FDG) INJECTION
8.0500 | Freq: Once | INTRAVENOUS | Status: AC | PRN
  Administered 2019-01-26: 8.05 via INTRAVENOUS

## 2019-01-26 NOTE — Progress Notes (Signed)
RD working remotely.  68 year old female diagnosed with Larynx cancer.  PMH includes tracheostomy and thyroid disease.  Medications include Colace and Synthroid.  Labs were reviewed.  Height: 62 inches per patient. Weight: 165 pounds. UBW: 219 pounds in Dec or January per patient. BMI: 30.2   Patient endorses over 40 pound weight loss.  Reports she started pureed diet when she had to get a trach. She is blenderizing foods and drinking Boost High Protein (240 kcal, 20 grams protein) 2 times daily. She is also consuming puddings and milkshakes.  Nutrition Diagnosis: Unintentional weight loss related to Larynx cancer and associated treatments as evidenced by 25% weight loss over 5 months which is significant.  Intervention: Educated patient to continue frequent meals and snacks using pureed foods as tolerated. Reviewed strategies for adding calories and protein. Stressed importance of weight maintenance. Fact sheets have been provided. Questions were answered and teach back method used.  Monitoring, Evaluation, Goals: Patient will tolerate increased calories and protein for weight maintenance and healing.  Next Visit: To be scheduled weekly with treatment.

## 2019-01-26 NOTE — Telephone Encounter (Signed)
Spoke with Dr. Valeta Harms over the phone. States that this pt needs to be set up for an EBUS. Order has been placed. Nothing further was needed at this time.

## 2019-01-27 ENCOUNTER — Inpatient Hospital Stay (HOSPITAL_BASED_OUTPATIENT_CLINIC_OR_DEPARTMENT_OTHER): Payer: Medicare HMO | Admitting: Hematology

## 2019-01-27 ENCOUNTER — Other Ambulatory Visit: Payer: Self-pay | Admitting: *Deleted

## 2019-01-27 ENCOUNTER — Other Ambulatory Visit: Payer: Self-pay | Admitting: Hematology

## 2019-01-27 ENCOUNTER — Encounter: Payer: Self-pay | Admitting: Hematology

## 2019-01-27 ENCOUNTER — Telehealth: Payer: Self-pay | Admitting: Hematology

## 2019-01-27 ENCOUNTER — Telehealth: Payer: Self-pay | Admitting: *Deleted

## 2019-01-27 ENCOUNTER — Other Ambulatory Visit: Payer: Self-pay

## 2019-01-27 VITALS — BP 110/73 | HR 78 | Temp 98.2°F | Resp 17 | Ht 62.0 in | Wt 164.5 lb

## 2019-01-27 DIAGNOSIS — R1310 Dysphagia, unspecified: Secondary | ICD-10-CM | POA: Diagnosis not present

## 2019-01-27 DIAGNOSIS — C321 Malignant neoplasm of supraglottis: Secondary | ICD-10-CM

## 2019-01-27 DIAGNOSIS — C778 Secondary and unspecified malignant neoplasm of lymph nodes of multiple regions: Secondary | ICD-10-CM | POA: Diagnosis not present

## 2019-01-27 DIAGNOSIS — I700 Atherosclerosis of aorta: Secondary | ICD-10-CM | POA: Diagnosis not present

## 2019-01-27 DIAGNOSIS — R05 Cough: Secondary | ICD-10-CM

## 2019-01-27 NOTE — Telephone Encounter (Signed)
Medical records faxed to [facility]; [place] [id number]

## 2019-01-27 NOTE — H&P (View-Only) (Signed)
[place] OFFICE PROGRESS NOTE  Patient Care Team: Philmore Pali, NP as PCP - General (Nurse Practitioner)  HEME/ONC OVERVIEW: 1. Squamous cell carcinoma of the supraglottic larynx, stage TBD -11/2018:   CT neck showed a 3.1 x 2.8cm left supraglottic laryngeal mass extending across the anterior commissure and invading the prelaryngeal fat, no invasion through the thyroid cartilage. Suspicious <1cm bilateral Level II cervical LN's.   Transferred to [facility] for airway compromise and taken to OR for tracheostomy, bx of the supraglottic lesion did not show any malignancy  Bx of the left cervical LN was non-diagnostic -12/2018: repeat DL with bx of the supraglottic mass showed well-differentiated invasive squamous cell carcinoma  -01/2019: PET showed locally advanced FDG-avid laryngeal neoplasm with bilateral cervical LN involvement as well as mildly FDG-avid mediastinal LN's, concerning for metastatic disease   TREATMENT REGIMEN:  12/03/2018: tracheostomy, DL and bx at [facility]   Systemic therapy TBD   ASSESSMENT & PLAN:   Squamous cell carcinoma of the supraglottic larynx, stage TBD -I independently reviewed the radiologic images of recent PET, and agree with the findings as documented  -In summary, PET showed locally advanced FDG-avid laryngeal neoplasm with bilateral cervical LN involvement as well as a few mediastinal LN's with questionable FDG avidity  -We discussed the case at length at the H&N tumor board, and I aslo reviewed the radiologic images in detail with Dr. Polly Cobia of radiology -While the reactive etiology cannot be definitely ruled out in the setting of urgent tracheostomy in 11/2018, Dr. Polly Cobia felt that the amount of time that has passed since the tracheostomy makes reactive etiology somewhat less likely, and malignancy certainly cannot be ruled out, especially in light of bilateral cervical LN involvement  -Therefore, I have discussed the case with Dr. Valeta Harms of pulmonary  medicine and referred her for urgent bronchoscopy; Dr. Valeta Harms also recommended discussing the case with GI and see if the LN's are amendable for bx via EUS, and I have sent a message to GI for their input -I reviewed the radiologic images in detail with the patient and planed to her the rationale for pursuing mediastinal lymph node biopsy; patient expressed understanding, and agreed with the plan -Meanwhile, we can proceed with port placement as she will require some form of systemic therapy regardless of the stage -In addition, we will move forward with CT simulation, in the event that the mediastinal LN is negative for malignancy, so that we can start definitive chemoradiation quickly  -Feeding tube to be determined, pending the treatment plan  -Finally, patient has had some intermittent blockage with her tracheostomy and requested a local ENT referral. I have placed referral to Dr. Constance Holster for further management of tracheostomy.  Dysphagia with coughing -Swallow study scheduled on 02/02/2019 to assess for any evidence of aspiration   Orders Placed This Encounter  Procedures  . IR IMAGING GUIDED PORT INSERTION    Standing Status:   Future    Standing Expiration Date:   03/28/2020    Order Specific Question:   Reason for Exam (SYMPTOM  OR DIAGNOSIS REQUIRED)    Answer:   Advanced laryngeal cancer    Order Specific Question:   Preferred Imaging Location?    Answer:   [facility]  . CBC with Differential ([place] Only)    Standing Status:   Future    Standing Expiration Date:   03/02/2020  . CMP ([place] only)    Standing Status:   Future  Standing Expiration Date:   03/02/2020  . Ambulatory referral to ENT    Referral Priority:   Urgent    Referral Type:   Consultation    Referral Reason:   Specialty Services Required    Referred to Provider:   Izora Gala, MD    Requested Specialty:   Otolaryngology    Number of Visits Requested:   1   All questions were answered.  The patient knows to call the clinic with any problems, questions or concerns. No barriers to learning was detected.  A total of more than 40 minutes were spent face-to-face with the patient during this encounter and over half of that time was spent on counseling and coordination of care as outlined above.   Tish Men, MD 01/27/2019 1:30 PM  CHIEF COMPLAINT: "I heard about the PET scan"  INTERVAL HISTORY: Ms. Hesse returns to clinic for follow-up of her recent PET scan results.  Patient reports that she still has intermittent coughing with some scant clear sputum, but she denies any chest pain, dyspnea, patient's primary joint involvement, and reported that patient has had some intermittent blockage of the trach, and they have had to clean the tracheostomy periodically to keep it open.  She otherwise denies any other complaint today.  SUMMARY OF ONCOLOGIC HISTORY:   Malignant neoplasm of supraglottis ([place])   12/02/2018 Imaging    CT neck: IMPRESSION: 1. Supraglottic laryngeal carcinoma extending across the anterior commissure and invading the prelaryngeal fat, left aryepiglottic fold and left aspect of the epiglottis. 2. Severe narrowing of the laryngeal airway. 3. Bilateral subcentimeter level 2A and 2A cervical lymph nodes.  Critical Value/emergent results were called by telephone at the time of interpretation on 12/02/2018 at 5:34 pm to Dr. Collier Salina, who verbally acknowledged these results.    12/02/2018 Imaging    CT  Chest:  IMPRESSION: No evidence of metastatic disease in the chest.    12/03/2018 Pathology Results    (Care Everywhere; [facility]) A: Larynx, supraglottis, biopsy - Atypical squamous proliferation with at least low grade dysplasia and abundant keratinization - No definite high grade dysplasia or invasive carcinoma represented (see comment)    12/18/2018 Pathology Results    (Care Everywhere; [facility]) A:  Neck, left, fine needle aspiration - Rare  single atypical cells, no cells diagnostic of malignancy - Lymph node material present  B:  Neck, right, fine needle aspiration - Non-diagnostic specimen - No lymph node material identified - Mixed inflammation and blood    12/31/2018 Pathology Results    (Care Everywhere; [facility])  A: Larynx, left supraglottic mass, biopsy - Atypical squamous proliferation with abundant keratinization, suspicious for well-differentiated invasive squamous cell carcinoma  B: Larynx, left supraglottic mass, biopsy - Well-differentiated invasive squamous cell carcinoma with abundant keratinization (see comment)    01/18/2019 Initial Diagnosis    Laryngeal cancer ([place])    01/22/2019 Cancer Staging    Staging form: Larynx - Supraglottis, AJCC 8th Edition - Clinical: Stage Unknown (cT3, cNX) - Signed by Eppie Gibson, MD on 01/22/2019    01/26/2019 Imaging    PET: IMPRESSION: 1. Locally advanced hypermetabolic laryngeal neoplasm centered in the left supraglottic space with bilateral glottic and epiglottic involvement and subglottic extension on the left. 2. Hypermetabolic bilateral level 3 neck nodal metastases. 3. Hypermetabolic high mediastinal nodal metastases between the trachea and upper thoracic esophagus. 4. Otherwise no distant hypermetabolic metastatic disease. 5. Nodular 7 mm focus at the left lung base, below PET resolution, recommend attention  on follow-up chest CT in 3 months. 6. Chronic findings include: Aortic Atherosclerosis (ICD10-I70.0). Cholelithiasis.     REVIEW OF SYSTEMS:   Constitutional: ( - ) fevers, ( - )  chills , ( - ) night sweats Eyes: ( - ) blurriness of vision, ( - ) double vision, ( - ) watery eyes Ears, nose, mouth, throat, and face: ( - ) mucositis, ( - ) sore throat Respiratory: ( + ) cough, ( - ) dyspnea, ( - ) wheezes Cardiovascular: ( - ) palpitation, ( - ) chest discomfort, ( - ) lower extremity swelling Gastrointestinal:  ( - ) nausea, ( - ) heartburn, ( -  ) change in bowel habits Skin: ( - ) abnormal skin rashes Lymphatics: ( - ) new lymphadenopathy, ( - ) easy bruising Neurological: ( - ) numbness, ( - ) tingling, ( - ) new weaknesses Behavioral/Psych: ( - ) mood change, ( - ) new changes  All other systems were reviewed with the patient and are negative.  I have reviewed the past medical history, past surgical history, social history and family history with the patient and they are unchanged from previous note.  ALLERGIES:  has No Known Allergies.  MEDICATIONS:  Current Outpatient Medications  Medication Sig Dispense Refill  . chlorhexidine (PERIDEX) 0.12 % solution Rinse with 15 mls twice daily for 30 seconds. Use after breakfast and at bedtime. Spit out excess. Do not swallow. 480 mL prn  . docusate sodium (COLACE) 100 MG capsule Take 100 mg by mouth 2 (two) times daily.    Marland Kitchen ezetimibe (ZETIA) 10 MG tablet     . fenofibrate 160 MG tablet     . ibuprofen (ADVIL) 800 MG tablet TAKE 1 (ONE) TABLET 2 TO 3 TIMES DAILY AS NEEDED KNEE PAIN    . levothyroxine (SYNTHROID) 150 MCG tablet TAKE 1 TABLET BY MOUTH EVERY OTHER DAY ALTERNATING WITH 175MCG    . levothyroxine (SYNTHROID) 175 MCG tablet TAKE 1 TABLET BY MOUTH EVERY OTHER DAY ALTERNATING WITH 150MCG    . sodium fluoride (PREVIDENT 5000 PLUS) 1.1 % CREA dental cream Apply cream to tooth brush. Brush teeth for 2 minutes. Spit out excess. DO NOT rinse afterwards. Repeat nightly. 1 Tube prn   No current facility-administered medications for this visit.     PHYSICAL EXAMINATION: ECOG PERFORMANCE STATUS: 1 - Symptomatic but completely ambulatory  Today's Vitals   01/27/19 1011 01/27/19 1016  BP: 110/73   Pulse: 78   Resp: 17   Temp: 98.2 F (36.8 C)   TempSrc: Oral   SpO2: 96%   Weight: 164 lb 8 oz (74.6 kg)   Height: 5\' 2"  (1.575 m)   PainSc:  0-No pain   Body mass index is 30.09 kg/m.  Filed Weights   01/27/19 1011  Weight: 164 lb 8 oz (74.6 kg)    GENERAL: alert, no  distress and comfortable SKIN: skin color, texture, turgor are normal, no rashes or significant lesions EYES: conjunctiva are pink and non-injected, sclera clear OROPHARYNX: no exudate, no erythema; lips, buccal mucosa, and tongue normal  NECK: supple, non-tender, tracheostomy in place  LUNGS: clear to auscultation with normal breathing effort HEART: regular rate & rhythm and no murmurs and no lower extremity edema ABDOMEN: soft, non-tender, non-distended, normal bowel sounds Musculoskeletal: no cyanosis of digits and no clubbing  PSYCH: alert & oriented x 3, quiet speech due to tracheostomy in place  NEURO: no focal motor/sensory deficits  LABORATORY DATA:  I have reviewed the  data as listed    Component Value Date/Time   NA 140 01/20/2019 0904   K 4.3 01/20/2019 0904   CL 103 01/20/2019 0904   CO2 26 01/20/2019 0904   GLUCOSE 94 01/20/2019 0904   BUN 12 01/20/2019 0904   CREATININE 0.78 01/20/2019 0904   CALCIUM 10.3 01/20/2019 0904   PROT 8.0 01/20/2019 0904   ALBUMIN 3.7 01/20/2019 0904   AST 16 01/20/2019 0904   ALT 23 01/20/2019 0904   ALKPHOS 114 01/20/2019 0904   BILITOT 0.3 01/20/2019 0904   GFRNONAA >60 01/20/2019 0904   GFRAA >60 01/20/2019 0904    No results found for: SPEP, UPEP  Lab Results  Component Value Date   WBC 7.4 01/20/2019   NEUTROABS 3.7 01/20/2019   HGB 13.2 01/20/2019   HCT 42.0 01/20/2019   MCV 84.5 01/20/2019   PLT 406 (H) 01/20/2019      Chemistry      Component Value Date/Time   NA 140 01/20/2019 0904   K 4.3 01/20/2019 0904   CL 103 01/20/2019 0904   CO2 26 01/20/2019 0904   BUN 12 01/20/2019 0904   CREATININE 0.78 01/20/2019 0904      Component Value Date/Time   CALCIUM 10.3 01/20/2019 0904   ALKPHOS 114 01/20/2019 0904   AST 16 01/20/2019 0904   ALT 23 01/20/2019 0904   BILITOT 0.3 01/20/2019 0904       RADIOGRAPHIC STUDIES: I have personally reviewed the radiological images as listed below and agreed with the  findings in the report. Nm Pet Image Initial (pi) Skull Base To Thigh  Result Date: 01/26/2019 CLINICAL DATA:  Initial treatment strategy for supraglottic laryngeal cancer. EXAM: NUCLEAR MEDICINE PET SKULL BASE TO THIGH TECHNIQUE: 8.1 mCi F-18 FDG was injected intravenously. Full-ring PET imaging was performed from the skull base to thigh after the radiotracer. CT data was obtained and used for attenuation correction and anatomic localization. Fasting blood glucose: 92 mg/dl COMPARISON:  None. FINDINGS: Mediastinal blood pool activity: SUV max 2.4 NECK: There is a locally advanced hypermetabolic laryngeal mass measuring approximately 4.4 x 4.0 cm with max SUV 15.4 centered in the left supraglottic space, with bilateral glottic and epiglottic involvement and with subglottic extension on the left. Bilateral hypermetabolic level 3 lymph nodes in the neck, for example 1.1 cm on the right with max SUV 6.3 (series 4/image 21) and 1.3 cm on the left with max SUV 5.7 (series 4/image 22). Incidental CT findings: Tracheostomy tube terminates in tracheal lumen at the level of the thoracic inlet. CHEST: There are mildly enlarged hypermetabolic high mediastinal lymph node between the trachea and upper thoracic esophagus, measuring 1.1 cm on the right with max SUV 3.9 (series 4/image 34) and 1.0 cm on the left with max SUV 3.8 (series 4/image 36). No enlarged or hypermetabolic axillary or hilar lymph nodes. No hypermetabolic pulmonary findings. Incidental CT findings: Atherosclerotic nonaneurysmal thoracic aorta. Nodular 7 mm peripheral basilar left lower lobe focus (series 8/image 41), below PET resolution. No additional significant pulmonary nodules. ABDOMEN/PELVIS: No abnormal hypermetabolic activity within the liver, pancreas, adrenal glands, or spleen. No hypermetabolic lymph nodes in the abdomen or pelvis. Nonspecific hypermetabolism at anorectal junction with max SUV 4.9, without discrete mass on the noncontrast CT  images. Incidental CT findings: Cholelithiasis. Atherosclerotic nonaneurysmal abdominal aorta. SKELETON: No focal hypermetabolic activity to suggest skeletal metastasis. Incidental CT findings: Marked degenerative changes throughout the spine. IMPRESSION: 1. Locally advanced hypermetabolic laryngeal neoplasm centered in the left supraglottic space  with bilateral glottic and epiglottic involvement and subglottic extension on the left. 2. Hypermetabolic bilateral level 3 neck nodal metastases. 3. Hypermetabolic high mediastinal nodal metastases between the trachea and upper thoracic esophagus. 4. Otherwise no distant hypermetabolic metastatic disease. 5. Nodular 7 mm focus at the left lung base, below PET resolution, recommend attention on follow-up chest CT in 3 months. 6. Chronic findings include: Aortic Atherosclerosis (ICD10-I70.0). Cholelithiasis. Electronically Signed   By: Ilona Sorrel M.D.   On: 01/26/2019 10:28

## 2019-01-27 NOTE — Telephone Encounter (Signed)
Scheduled appt per 5/6 los.

## 2019-01-27 NOTE — Progress Notes (Signed)
[place] OFFICE PROGRESS NOTE  Patient Care Team: Philmore Pali, NP as PCP - General (Nurse Practitioner)  HEME/ONC OVERVIEW: 1. Squamous cell carcinoma of the supraglottic larynx, stage TBD -11/2018:   CT neck showed a 3.1 x 2.8cm left supraglottic laryngeal mass extending across the anterior commissure and invading the prelaryngeal fat, no invasion through the thyroid cartilage. Suspicious <1cm bilateral Level II cervical LN's.   Transferred to [facility] for airway compromise and taken to OR for tracheostomy, bx of the supraglottic lesion did not show any malignancy  Bx of the left cervical LN was non-diagnostic -12/2018: repeat DL with bx of the supraglottic mass showed well-differentiated invasive squamous cell carcinoma  -01/2019: PET showed locally advanced FDG-avid laryngeal neoplasm with bilateral cervical LN involvement as well as mildly FDG-avid mediastinal LN's, concerning for metastatic disease   TREATMENT REGIMEN:  12/03/2018: tracheostomy, DL and bx at [facility]   Systemic therapy TBD   ASSESSMENT & PLAN:   Squamous cell carcinoma of the supraglottic larynx, stage TBD -I independently reviewed the radiologic images of recent PET, and agree with the findings as documented  -In summary, PET showed locally advanced FDG-avid laryngeal neoplasm with bilateral cervical LN involvement as well as a few mediastinal LN's with questionable FDG avidity  -We discussed the case at length at the H&N tumor board, and I aslo reviewed the radiologic images in detail with Dr. Polly Cobia of radiology -While the reactive etiology cannot be definitely ruled out in the setting of urgent tracheostomy in 11/2018, Dr. Polly Cobia felt that the amount of time that has passed since the tracheostomy makes reactive etiology somewhat less likely, and malignancy certainly cannot be ruled out, especially in light of bilateral cervical LN involvement  -Therefore, I have discussed the case with Dr. Valeta Harms of pulmonary  medicine and referred her for urgent bronchoscopy; Dr. Valeta Harms also recommended discussing the case with GI and see if the LN's are amendable for bx via EUS, and I have sent a message to GI for their input -I reviewed the radiologic images in detail with the patient and planed to her the rationale for pursuing mediastinal lymph node biopsy; patient expressed understanding, and agreed with the plan -Meanwhile, we can proceed with port placement as she will require some form of systemic therapy regardless of the stage -In addition, we will move forward with CT simulation, in the event that the mediastinal LN is negative for malignancy, so that we can start definitive chemoradiation quickly  -Feeding tube to be determined, pending the treatment plan  -Finally, patient has had some intermittent blockage with her tracheostomy and requested a local ENT referral. I have placed referral to Dr. Constance Holster for further management of tracheostomy.  Dysphagia with coughing -Swallow study scheduled on 02/02/2019 to assess for any evidence of aspiration   Orders Placed This Encounter  Procedures  . IR IMAGING GUIDED PORT INSERTION    Standing Status:   Future    Standing Expiration Date:   03/28/2020    Order Specific Question:   Reason for Exam (SYMPTOM  OR DIAGNOSIS REQUIRED)    Answer:   Advanced laryngeal cancer    Order Specific Question:   Preferred Imaging Location?    Answer:   [facility]  . CBC with Differential ([place] Only)    Standing Status:   Future    Standing Expiration Date:   03/02/2020  . CMP ([place] only)    Standing Status:   Future  Standing Expiration Date:   03/02/2020  . Ambulatory referral to ENT    Referral Priority:   Urgent    Referral Type:   Consultation    Referral Reason:   Specialty Services Required    Referred to Provider:   Izora Gala, MD    Requested Specialty:   Otolaryngology    Number of Visits Requested:   1   All questions were answered.  The patient knows to call the clinic with any problems, questions or concerns. No barriers to learning was detected.  A total of more than 40 minutes were spent face-to-face with the patient during this encounter and over half of that time was spent on counseling and coordination of care as outlined above.   Tish Men, MD 01/27/2019 1:30 PM  CHIEF COMPLAINT: "I heard about the PET scan"  INTERVAL HISTORY: Ms. Silvernail returns to clinic for follow-up of her recent PET scan results.  Patient reports that she still has intermittent coughing with some scant clear sputum, but she denies any chest pain, dyspnea, patient's primary joint involvement, and reported that patient has had some intermittent blockage of the trach, and they have had to clean the tracheostomy periodically to keep it open.  She otherwise denies any other complaint today.  SUMMARY OF ONCOLOGIC HISTORY:   Malignant neoplasm of supraglottis ([place])   12/02/2018 Imaging    CT neck: IMPRESSION: 1. Supraglottic laryngeal carcinoma extending across the anterior commissure and invading the prelaryngeal fat, left aryepiglottic fold and left aspect of the epiglottis. 2. Severe narrowing of the laryngeal airway. 3. Bilateral subcentimeter level 2A and 2A cervical lymph nodes.  Critical Value/emergent results were called by telephone at the time of interpretation on 12/02/2018 at 5:34 pm to Dr. Collier Salina, who verbally acknowledged these results.    12/02/2018 Imaging    CT  Chest:  IMPRESSION: No evidence of metastatic disease in the chest.    12/03/2018 Pathology Results    (Care Everywhere; [facility]) A: Larynx, supraglottis, biopsy - Atypical squamous proliferation with at least low grade dysplasia and abundant keratinization - No definite high grade dysplasia or invasive carcinoma represented (see comment)    12/18/2018 Pathology Results    (Care Everywhere; [facility]) A:  Neck, left, fine needle aspiration - Rare  single atypical cells, no cells diagnostic of malignancy - Lymph node material present  B:  Neck, right, fine needle aspiration - Non-diagnostic specimen - No lymph node material identified - Mixed inflammation and blood    12/31/2018 Pathology Results    (Care Everywhere; [facility])  A: Larynx, left supraglottic mass, biopsy - Atypical squamous proliferation with abundant keratinization, suspicious for well-differentiated invasive squamous cell carcinoma  B: Larynx, left supraglottic mass, biopsy - Well-differentiated invasive squamous cell carcinoma with abundant keratinization (see comment)    01/18/2019 Initial Diagnosis    Laryngeal cancer ([place])    01/22/2019 Cancer Staging    Staging form: Larynx - Supraglottis, AJCC 8th Edition - Clinical: Stage Unknown (cT3, cNX) - Signed by Eppie Gibson, MD on 01/22/2019    01/26/2019 Imaging    PET: IMPRESSION: 1. Locally advanced hypermetabolic laryngeal neoplasm centered in the left supraglottic space with bilateral glottic and epiglottic involvement and subglottic extension on the left. 2. Hypermetabolic bilateral level 3 neck nodal metastases. 3. Hypermetabolic high mediastinal nodal metastases between the trachea and upper thoracic esophagus. 4. Otherwise no distant hypermetabolic metastatic disease. 5. Nodular 7 mm focus at the left lung base, below PET resolution, recommend attention  on follow-up chest CT in 3 months. 6. Chronic findings include: Aortic Atherosclerosis (ICD10-I70.0). Cholelithiasis.     REVIEW OF SYSTEMS:   Constitutional: ( - ) fevers, ( - )  chills , ( - ) night sweats Eyes: ( - ) blurriness of vision, ( - ) double vision, ( - ) watery eyes Ears, nose, mouth, throat, and face: ( - ) mucositis, ( - ) sore throat Respiratory: ( + ) cough, ( - ) dyspnea, ( - ) wheezes Cardiovascular: ( - ) palpitation, ( - ) chest discomfort, ( - ) lower extremity swelling Gastrointestinal:  ( - ) nausea, ( - ) heartburn, ( -  ) change in bowel habits Skin: ( - ) abnormal skin rashes Lymphatics: ( - ) new lymphadenopathy, ( - ) easy bruising Neurological: ( - ) numbness, ( - ) tingling, ( - ) new weaknesses Behavioral/Psych: ( - ) mood change, ( - ) new changes  All other systems were reviewed with the patient and are negative.  I have reviewed the past medical history, past surgical history, social history and family history with the patient and they are unchanged from previous note.  ALLERGIES:  has No Known Allergies.  MEDICATIONS:  Current Outpatient Medications  Medication Sig Dispense Refill  . chlorhexidine (PERIDEX) 0.12 % solution Rinse with 15 mls twice daily for 30 seconds. Use after breakfast and at bedtime. Spit out excess. Do not swallow. 480 mL prn  . docusate sodium (COLACE) 100 MG capsule Take 100 mg by mouth 2 (two) times daily.    Marland Kitchen ezetimibe (ZETIA) 10 MG tablet     . fenofibrate 160 MG tablet     . ibuprofen (ADVIL) 800 MG tablet TAKE 1 (ONE) TABLET 2 TO 3 TIMES DAILY AS NEEDED KNEE PAIN    . levothyroxine (SYNTHROID) 150 MCG tablet TAKE 1 TABLET BY MOUTH EVERY OTHER DAY ALTERNATING WITH 175MCG    . levothyroxine (SYNTHROID) 175 MCG tablet TAKE 1 TABLET BY MOUTH EVERY OTHER DAY ALTERNATING WITH 150MCG    . sodium fluoride (PREVIDENT 5000 PLUS) 1.1 % CREA dental cream Apply cream to tooth brush. Brush teeth for 2 minutes. Spit out excess. DO NOT rinse afterwards. Repeat nightly. 1 Tube prn   No current facility-administered medications for this visit.     PHYSICAL EXAMINATION: ECOG PERFORMANCE STATUS: 1 - Symptomatic but completely ambulatory  Today's Vitals   01/27/19 1011 01/27/19 1016  BP: 110/73   Pulse: 78   Resp: 17   Temp: 98.2 F (36.8 C)   TempSrc: Oral   SpO2: 96%   Weight: 164 lb 8 oz (74.6 kg)   Height: 5\' 2"  (1.575 m)   PainSc:  0-No pain   Body mass index is 30.09 kg/m.  Filed Weights   01/27/19 1011  Weight: 164 lb 8 oz (74.6 kg)    GENERAL: alert, no  distress and comfortable SKIN: skin color, texture, turgor are normal, no rashes or significant lesions EYES: conjunctiva are pink and non-injected, sclera clear OROPHARYNX: no exudate, no erythema; lips, buccal mucosa, and tongue normal  NECK: supple, non-tender, tracheostomy in place  LUNGS: clear to auscultation with normal breathing effort HEART: regular rate & rhythm and no murmurs and no lower extremity edema ABDOMEN: soft, non-tender, non-distended, normal bowel sounds Musculoskeletal: no cyanosis of digits and no clubbing  PSYCH: alert & oriented x 3, quiet speech due to tracheostomy in place  NEURO: no focal motor/sensory deficits  LABORATORY DATA:  I have reviewed the  data as listed    Component Value Date/Time   NA 140 01/20/2019 0904   K 4.3 01/20/2019 0904   CL 103 01/20/2019 0904   CO2 26 01/20/2019 0904   GLUCOSE 94 01/20/2019 0904   BUN 12 01/20/2019 0904   CREATININE 0.78 01/20/2019 0904   CALCIUM 10.3 01/20/2019 0904   PROT 8.0 01/20/2019 0904   ALBUMIN 3.7 01/20/2019 0904   AST 16 01/20/2019 0904   ALT 23 01/20/2019 0904   ALKPHOS 114 01/20/2019 0904   BILITOT 0.3 01/20/2019 0904   GFRNONAA >60 01/20/2019 0904   GFRAA >60 01/20/2019 0904    No results found for: SPEP, UPEP  Lab Results  Component Value Date   WBC 7.4 01/20/2019   NEUTROABS 3.7 01/20/2019   HGB 13.2 01/20/2019   HCT 42.0 01/20/2019   MCV 84.5 01/20/2019   PLT 406 (H) 01/20/2019      Chemistry      Component Value Date/Time   NA 140 01/20/2019 0904   K 4.3 01/20/2019 0904   CL 103 01/20/2019 0904   CO2 26 01/20/2019 0904   BUN 12 01/20/2019 0904   CREATININE 0.78 01/20/2019 0904      Component Value Date/Time   CALCIUM 10.3 01/20/2019 0904   ALKPHOS 114 01/20/2019 0904   AST 16 01/20/2019 0904   ALT 23 01/20/2019 0904   BILITOT 0.3 01/20/2019 0904       RADIOGRAPHIC STUDIES: I have personally reviewed the radiological images as listed below and agreed with the  findings in the report. Nm Pet Image Initial (pi) Skull Base To Thigh  Result Date: 01/26/2019 CLINICAL DATA:  Initial treatment strategy for supraglottic laryngeal cancer. EXAM: NUCLEAR MEDICINE PET SKULL BASE TO THIGH TECHNIQUE: 8.1 mCi F-18 FDG was injected intravenously. Full-ring PET imaging was performed from the skull base to thigh after the radiotracer. CT data was obtained and used for attenuation correction and anatomic localization. Fasting blood glucose: 92 mg/dl COMPARISON:  None. FINDINGS: Mediastinal blood pool activity: SUV max 2.4 NECK: There is a locally advanced hypermetabolic laryngeal mass measuring approximately 4.4 x 4.0 cm with max SUV 15.4 centered in the left supraglottic space, with bilateral glottic and epiglottic involvement and with subglottic extension on the left. Bilateral hypermetabolic level 3 lymph nodes in the neck, for example 1.1 cm on the right with max SUV 6.3 (series 4/image 21) and 1.3 cm on the left with max SUV 5.7 (series 4/image 22). Incidental CT findings: Tracheostomy tube terminates in tracheal lumen at the level of the thoracic inlet. CHEST: There are mildly enlarged hypermetabolic high mediastinal lymph node between the trachea and upper thoracic esophagus, measuring 1.1 cm on the right with max SUV 3.9 (series 4/image 34) and 1.0 cm on the left with max SUV 3.8 (series 4/image 36). No enlarged or hypermetabolic axillary or hilar lymph nodes. No hypermetabolic pulmonary findings. Incidental CT findings: Atherosclerotic nonaneurysmal thoracic aorta. Nodular 7 mm peripheral basilar left lower lobe focus (series 8/image 41), below PET resolution. No additional significant pulmonary nodules. ABDOMEN/PELVIS: No abnormal hypermetabolic activity within the liver, pancreas, adrenal glands, or spleen. No hypermetabolic lymph nodes in the abdomen or pelvis. Nonspecific hypermetabolism at anorectal junction with max SUV 4.9, without discrete mass on the noncontrast CT  images. Incidental CT findings: Cholelithiasis. Atherosclerotic nonaneurysmal abdominal aorta. SKELETON: No focal hypermetabolic activity to suggest skeletal metastasis. Incidental CT findings: Marked degenerative changes throughout the spine. IMPRESSION: 1. Locally advanced hypermetabolic laryngeal neoplasm centered in the left supraglottic space  with bilateral glottic and epiglottic involvement and subglottic extension on the left. 2. Hypermetabolic bilateral level 3 neck nodal metastases. 3. Hypermetabolic high mediastinal nodal metastases between the trachea and upper thoracic esophagus. 4. Otherwise no distant hypermetabolic metastatic disease. 5. Nodular 7 mm focus at the left lung base, below PET resolution, recommend attention on follow-up chest CT in 3 months. 6. Chronic findings include: Aortic Atherosclerosis (ICD10-I70.0). Cholelithiasis. Electronically Signed   By: Ilona Sorrel M.D.   On: 01/26/2019 10:28

## 2019-01-28 ENCOUNTER — Other Ambulatory Visit: Payer: Self-pay

## 2019-01-28 ENCOUNTER — Other Ambulatory Visit: Payer: Self-pay | Admitting: Hematology

## 2019-01-28 ENCOUNTER — Telehealth: Payer: Self-pay | Admitting: *Deleted

## 2019-01-28 ENCOUNTER — Telehealth: Payer: Self-pay

## 2019-01-28 DIAGNOSIS — C321 Malignant neoplasm of supraglottis: Secondary | ICD-10-CM

## 2019-01-28 NOTE — Telephone Encounter (Signed)
Dr Carlean Purl do you need to review this for hospital schedule?

## 2019-01-28 NOTE — Progress Notes (Unsigned)
tro

## 2019-01-28 NOTE — Telephone Encounter (Signed)
This is a cancer diagnosis procedure and is ok

## 2019-01-28 NOTE — Telephone Encounter (Signed)
Pt scheduled for EUS on 5/11 at 730 am [facility] with Dr Ardis Hughs.  Left message on machine to call back

## 2019-01-28 NOTE — Telephone Encounter (Signed)
[name] Work  Clinical Social Work was referred by Pension scheme manager for assessment of psychosocial needs.  Clinical Social Worker contacted caregiver by phone  to offer support and assess for needs.   Patient unable to speak, patient's partner [name] [name] spoke on patient's behalf.  She shared they are doing well and the majority of their time is spent going to appointments.  She shared she is grateful they have today and tomorrow to stay at home and slow down.  Their son, Wille Glaser, and daughter in law also live in the home-they have found this very helpful as he runs all needed errands. 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  CSW encouraged patient/partner to think about new ways to find joy if work is not an option.  Patient's partner believes they are doing well, even with patient unable to verbally communicate.  They have been together for 30+ years and feel they communicate well even by verbal language or anticipating her needs.  CSW explained role of support and resources at the cancer center and encouraged patient's partner to call if any needs arise.   Gwinda Maine, LCSW  Clinical Social Worker [facility]

## 2019-01-28 NOTE — Telephone Encounter (Addendum)
Oncology Nurse Navigator Documentation  Called [facility] in follow-up to referral placed by Dr. Maylon Peppers to establish pt with ENT Constance Holster for trach management.  LVMM with Scheduling and Dr. Janeice Robinson MA asking for call-back.  Gayleen Orem, RN, BSN Head & Neck Oncology Nurse [place] [facility] [place] [phone]

## 2019-01-28 NOTE — Telephone Encounter (Signed)
Oncology Nurse Navigator Documentation  Called [name] [name] in follow-up to yesterday's follow-up with Dr. Maylon Peppers, spoke with partner [name]. She indicated they understand Dr. Lorette Ang discussion of PET results, his recommendation for further evaluation of mediastinal LN in order to establish best tmt plan, I.e. RT with chemo, chemo alone. She indicated they had been contacted re scheduling of EUS for next Friday. I encouraged her to call me with questions/concerns.  She agreed.  Gayleen Orem, RN, BSN Head & Neck Oncology Nurse [facility] [place] [phone]

## 2019-01-28 NOTE — Telephone Encounter (Signed)
----- Message from Irving Copas., MD sent at 01/28/2019 10:27 AM EDT ----- Regarding: RE: Mediastinal LN bx Thanks all, for the review and discussion. We will give it a shot and see how we may be able to help. Joclynn Lumb, please reach out to the patient and work on scheduling an [place] in the next 2-weeks for attempt at [place] sampling with Linna Hoff or myself. I am happy to try and do as a first case at the hospital during my hospital week if necessary next week if we do not have availability otherwise. Thanks. GM ----- Message ----- From: Garner Nash, DO Sent: 01/27/2019  10:17 PM EDT To: Milus Banister, MD, Collene Gobble, MD, # Subject: RE: Mediastinal LN bx                          I am happy to attempt. But overall yield I think will be low. My concerns are posterior tracheal wall approach and I feel like I will run into issues with the proximity of the tracheostomy tube to the desired insertion site. The needle from the Ebus scope actually comes out superior or more proximal. The trach could be an issue here.   Cc: Rob for his thoughts   Thanks  Brad ----- Message ----- From: Milus Banister, MD Sent: 01/27/2019   7:39 PM EDT To: Irving Copas., MD, # Subject: RE: Mediastinal LN bx                          Challenging location, the nodes almost directly abut the internal tip of her trach.  I agree with Chester Holstein that if EBUS attempt to sample the nodes by pulmonary is not possible or not successful it is definitely worth a try by EUS.  ----- Message ----- From: Irving Copas., MD Sent: 01/27/2019   5:06 PM EDT To: Milus Banister, MD, Garner Nash, DO, # Subject: RE: Mediastinal LN bx                          [place], I looked at the imaging. I think the Thoracic LNs that you are wanting do have windows via EUS but it will be a tight region. I think if EBUS is felt to be too high risk or not favorable then I think it is reasonable for  [place] to at least try. Especially if it is going to make a difference in overall prognosis for an individual. I'm putting Dan on this as well. If he is good with this, then let us know what you all decide about EBUS and we can work on trying to get the patient in for an attempt myself or with Linna Hoff (Chong Sicilian our RN would help with scheduling). Thanks for reaching out, hope we may be able to help this patient. Linna Hoff - check out images 33-37 on Series 4 Axial views. Gabe ----- Message ----- From: Tish Men, MD Sent: 01/27/2019   1:17 PM EDT To: Irving Copas., MD, # Subject: Mediastinal LN bx                              Hi Gabe,  How are you? I am seeing [name] [name], who has locally advanced laryngeal cancer with PET showing some questionable avid high mediastinal LN's, concerning  for mets. I reviewed the scans with radiology, but they were not able to make a commitment for reactive vs. Mets. Depending on if they are metastatic, it makes a difference in her treatment (chemoradiation for 7 weeks for curative intent vs. Palliative chemo). Leroy Sea was looking to see if he can get the LN behind the trachea with EBUS, and wanted to see what your thoughts are about biopsying it from EUS?Your help is greatly appreciated.   Thanks and have a good day.  Eulas Post

## 2019-01-29 ENCOUNTER — Inpatient Hospital Stay (HOSPITAL_COMMUNITY): Admission: RE | Admit: 2019-01-29 | Payer: Medicare HMO | Source: Ambulatory Visit

## 2019-01-29 ENCOUNTER — Other Ambulatory Visit (HOSPITAL_COMMUNITY)
Admission: RE | Admit: 2019-01-29 | Discharge: 2019-01-29 | Disposition: A | Discharge status: Home / Self Care | Payer: Medicare HMO | Source: Ambulatory Visit | Attending: Gastroenterology | Admitting: Gastroenterology

## 2019-01-29 ENCOUNTER — Other Ambulatory Visit: Payer: Self-pay

## 2019-01-29 DIAGNOSIS — Z1159 Encounter for screening for other viral diseases: Secondary | ICD-10-CM | POA: Insufficient documentation

## 2019-01-29 NOTE — Telephone Encounter (Signed)
EUS scheduled, pt instructed and medications reviewed.  Patient instructions mailed to home.  Patient to call with any questions or concerns.  

## 2019-01-31 ENCOUNTER — Other Ambulatory Visit: Payer: Self-pay | Admitting: Radiology

## 2019-01-31 LAB — NOVEL CORONAVIRUS, NAA (HOSP ORDER, SEND-OUT TO REF LAB; TAT 18-24 HRS): SARS-CoV-2, NAA: NOT DETECTED

## 2019-02-01 ENCOUNTER — Ambulatory Visit (HOSPITAL_COMMUNITY): Payer: Medicare HMO | Admitting: Anesthesiology

## 2019-02-01 ENCOUNTER — Encounter (HOSPITAL_COMMUNITY): Payer: Self-pay | Admitting: Certified Registered Nurse Anesthetist

## 2019-02-01 ENCOUNTER — Encounter (HOSPITAL_COMMUNITY)
Admission: RE | Disposition: A | Discharge status: Home / Self Care | Payer: Self-pay | Source: Home / Self Care | Attending: Gastroenterology

## 2019-02-01 ENCOUNTER — Telehealth: Payer: Self-pay | Admitting: *Deleted

## 2019-02-01 ENCOUNTER — Ambulatory Visit (HOSPITAL_COMMUNITY)
Admission: RE | Admit: 2019-02-01 | Discharge: 2019-02-01 | Disposition: A | Discharge status: Home / Self Care | Payer: Medicare HMO | Attending: Gastroenterology | Admitting: Gastroenterology

## 2019-02-01 DIAGNOSIS — Z430 Encounter for attention to tracheostomy: Secondary | ICD-10-CM | POA: Diagnosis not present

## 2019-02-01 DIAGNOSIS — E039 Hypothyroidism, unspecified: Secondary | ICD-10-CM | POA: Insufficient documentation

## 2019-02-01 DIAGNOSIS — C778 Secondary and unspecified malignant neoplasm of lymph nodes of multiple regions: Secondary | ICD-10-CM | POA: Diagnosis not present

## 2019-02-01 DIAGNOSIS — R935 Abnormal findings on diagnostic imaging of other abdominal regions, including retroperitoneum: Secondary | ICD-10-CM | POA: Diagnosis not present

## 2019-02-01 DIAGNOSIS — K8020 Calculus of gallbladder without cholecystitis without obstruction: Secondary | ICD-10-CM | POA: Diagnosis not present

## 2019-02-01 DIAGNOSIS — C321 Malignant neoplasm of supraglottis: Secondary | ICD-10-CM | POA: Diagnosis not present

## 2019-02-01 DIAGNOSIS — R05 Cough: Secondary | ICD-10-CM | POA: Insufficient documentation

## 2019-02-01 DIAGNOSIS — Z791 Long term (current) use of non-steroidal anti-inflammatories (NSAID): Secondary | ICD-10-CM | POA: Insufficient documentation

## 2019-02-01 DIAGNOSIS — Z87891 Personal history of nicotine dependence: Secondary | ICD-10-CM | POA: Insufficient documentation

## 2019-02-01 DIAGNOSIS — Z8589 Personal history of malignant neoplasm of other organs and systems: Secondary | ICD-10-CM | POA: Diagnosis not present

## 2019-02-01 DIAGNOSIS — J383 Other diseases of vocal cords: Secondary | ICD-10-CM | POA: Diagnosis not present

## 2019-02-01 DIAGNOSIS — I899 Noninfective disorder of lymphatic vessels and lymph nodes, unspecified: Secondary | ICD-10-CM

## 2019-02-01 DIAGNOSIS — Z79899 Other long term (current) drug therapy: Secondary | ICD-10-CM | POA: Insufficient documentation

## 2019-02-01 DIAGNOSIS — I700 Atherosclerosis of aorta: Secondary | ICD-10-CM | POA: Insufficient documentation

## 2019-02-01 DIAGNOSIS — J387 Other diseases of larynx: Secondary | ICD-10-CM | POA: Diagnosis not present

## 2019-02-01 DIAGNOSIS — R1310 Dysphagia, unspecified: Secondary | ICD-10-CM | POA: Insufficient documentation

## 2019-02-01 HISTORY — PX: FINE NEEDLE ASPIRATION: SHX5430

## 2019-02-01 HISTORY — PX: EUS: SHX5427

## 2019-02-01 HISTORY — PX: ESOPHAGOGASTRODUODENOSCOPY (EGD) WITH PROPOFOL: SHX5813

## 2019-02-01 SURGERY — ESOPHAGOGASTRODUODENOSCOPY (EGD) WITH PROPOFOL
Anesthesia: Monitor Anesthesia Care

## 2019-02-01 MED ORDER — PROPOFOL 10 MG/ML IV BOLUS
INTRAVENOUS | Status: AC
  Filled 2019-02-01: qty 40

## 2019-02-01 MED ORDER — PROPOFOL 500 MG/50ML IV EMUL
INTRAVENOUS | Status: DC | PRN
  Administered 2019-02-01: 150 ug/kg/min via INTRAVENOUS

## 2019-02-01 MED ORDER — LACTATED RINGERS IV SOLN
INTRAVENOUS | Status: DC
  Administered 2019-02-01: 12:00:00 via INTRAVENOUS

## 2019-02-01 MED ORDER — PROPOFOL 500 MG/50ML IV EMUL
INTRAVENOUS | Status: DC | PRN
  Administered 2019-02-01: 30 mg via INTRAVENOUS

## 2019-02-01 MED ORDER — SODIUM CHLORIDE 0.9 % IV SOLN: INTRAVENOUS | Status: DC

## 2019-02-01 MED ORDER — EPHEDRINE SULFATE 50 MG/ML IJ SOLN
INTRAMUSCULAR | Status: DC | PRN
  Administered 2019-02-01 (×2): 10 mg via INTRAVENOUS

## 2019-02-01 SURGICAL SUPPLY — 15 items

## 2019-02-01 NOTE — Discharge Instructions (Signed)
YOU HAD AN ENDOSCOPIC PROCEDURE TODAY: Refer to the procedure report and other information in the discharge instructions given to you for any specific questions about what was found during the examination. If this information does not answer your questions, please call [place] office at [phone] to clarify.   YOU SHOULD EXPECT: Some feelings of bloating in the abdomen. Passage of more gas than usual. Walking can help get rid of the air that was put into your GI tract during the procedure and reduce the bloating. If you had a lower endoscopy (such as a colonoscopy or flexible sigmoidoscopy) you may notice spotting of blood in your stool or on the toilet paper. Some abdominal soreness may be present for a day or two, also.  DIET: Your first meal following the procedure should be a light meal and then it is ok to progress to your normal diet. A half-sandwich or bowl of soup is an example of a good first meal. Heavy or fried foods are harder to digest and may make you feel nauseous or bloated. Drink plenty of fluids but you should avoid alcoholic beverages for 24 hours. If you had a esophageal dilation, please see attached instructions for diet.    ACTIVITY: Your care partner should take you home directly after the procedure. You should plan to take it easy, moving slowly for the rest of the day. You can resume normal activity the day after the procedure however YOU SHOULD NOT DRIVE, use power tools, machinery or perform tasks that involve climbing or major physical exertion for 24 hours (because of the sedation medicines used during the test).   SYMPTOMS TO REPORT IMMEDIATELY: A gastroenterologist can be reached at any hour. Please call [phone]  for any of the following symptoms:   Following upper endoscopy (EGD, EUS, ERCP, esophageal dilation) Vomiting of blood or coffee ground material  New, significant abdominal pain  New, significant chest pain or pain under the shoulder blades  Painful or  persistently difficult swallowing  New shortness of breath  Black, tarry-looking or red, bloody stools  FOLLOW UP:  If any biopsies were taken you will be contacted by phone or by letter within the next 1-3 weeks. Call [phone]  if you have not heard about the biopsies in 3 weeks.  Please also call with any specific questions about appointments or follow up tests.  

## 2019-02-01 NOTE — Interval H&P Note (Signed)
History and Physical Interval Note:  02/01/2019 11:58 AM  [name] [name]  has presented today for surgery, with the diagnosis of thoraic LN sampling.  The various methods of treatment have been discussed with the patient and family. After consideration of risks, benefits and other options for treatment, the patient has consented to  Procedure(s): ESOPHAGOGASTRODUODENOSCOPY (EGD) WITH PROPOFOL (N/A) UPPER ENDOSCOPIC ULTRASOUND (EUS) RADIAL-Will need Linear and Forward Viewing EUS Scope (N/A) as a surgical intervention.  The patient's history has been reviewed, patient examined, no change in status, stable for surgery.  I have reviewed the patient's chart and labs.  Questions were answered to the patient's satisfaction.     Milus Banister

## 2019-02-01 NOTE — Transfer of Care (Signed)
Immediate Anesthesia Transfer of Care Note  Patient: [name] [name]  Procedure(s) Performed: ESOPHAGOGASTRODUODENOSCOPY (EGD) WITH PROPOFOL (N/A ) UPPER ENDOSCOPIC ULTRASOUND (EUS) RADIAL-Will need Linear and Forward Viewing EUS Scope (N/A ) FINE NEEDLE ASPIRATION (FNA) LINEAR (N/A )  Patient Location: PACU  Anesthesia Type:MAC  Level of Consciousness: awake, alert  and oriented  Airway & Oxygen Therapy: Patient Spontanous Breathing and Patient connected to face mask oxygen  Post-op Assessment: Report given to RN and Post -op Vital signs reviewed and stable  Post vital signs: Reviewed and stable  Last Vitals:  Vitals Value Taken Time  BP 128/47 02/01/2019  1:28 PM  Temp    Pulse 81 02/01/2019  1:29 PM  Resp 16 02/01/2019  1:29 PM  SpO2 96 % 02/01/2019  1:29 PM  Vitals shown include unvalidated device data.  Last Pain:  Vitals:   02/01/19 1140  TempSrc: Oral  PainSc: 0-No pain         Complications: No apparent anesthesia complications

## 2019-02-01 NOTE — Anesthesia Postprocedure Evaluation (Signed)
Anesthesia Post Note  Patient: KALYN HOFSTRA  Procedure(s) Performed: ESOPHAGOGASTRODUODENOSCOPY (EGD) WITH PROPOFOL (N/A ) UPPER ENDOSCOPIC ULTRASOUND (EUS) RADIAL-Will need Linear and Forward Viewing EUS Scope (N/A ) FINE NEEDLE ASPIRATION (FNA) LINEAR (N/A )     Patient location during evaluation: Endoscopy Anesthesia Type: MAC Level of consciousness: awake and alert Pain management: pain level controlled Vital Signs Assessment: post-procedure vital signs reviewed and stable Respiratory status: spontaneous breathing, nonlabored ventilation and respiratory function stable Cardiovascular status: stable and blood pressure returned to baseline Postop Assessment: no apparent nausea or vomiting Anesthetic complications: no    Last Vitals:  Vitals:   02/01/19 1350 02/01/19 1400  BP: (!) 113/48 116/68  Pulse: 68 67  Resp: 19 (!) 25  Temp:    SpO2: 98% 100%    Last Pain:  Vitals:   02/01/19 1400  TempSrc:   PainSc: 0-No pain                 Catalina Gravel

## 2019-02-01 NOTE — Anesthesia Preprocedure Evaluation (Addendum)
Anesthesia Evaluation  Patient identified by MRN, date of birth, ID band Patient awake    Reviewed: Allergy & Precautions, NPO status , Patient's Chart, lab work & pertinent test results  Airway Mallampati: II  TM Distance: >3 FB Neck ROM: Full   Comment: 6.0 CFS Shiley trach  Dental   Pulmonary former smoker,  S/p trach. 6.0 CFS.   Pulmonary exam normal breath sounds clear to auscultation       Cardiovascular negative cardio ROS Normal cardiovascular exam Rhythm:Regular Rate:Normal     Neuro/Psych negative neurological ROS     GI/Hepatic negative GI ROS, Neg liver ROS,   Endo/Other  Hypothyroidism   Renal/GU negative Renal ROS     Musculoskeletal negative musculoskeletal ROS (+)   Abdominal   Peds  Hematology negative hematology ROS (+)   Anesthesia Other Findings Day of surgery medications reviewed with the patient.  Laryngeal cancer s/p trach   Reproductive/Obstetrics                            Anesthesia Physical Anesthesia Plan  ASA: III  Anesthesia Plan: MAC   Post-op Pain Management:    Induction: Intravenous  PONV Risk Score and Plan: 2 and Propofol infusion and Treatment may vary due to age or medical condition  Airway Management Planned: Simple Face Mask  Additional Equipment:   Intra-op Plan:   Post-operative Plan:   Informed Consent: I have reviewed the patients History and Physical, chart, labs and discussed the procedure including the risks, benefits and alternatives for the proposed anesthesia with the patient or authorized representative who has indicated his/her understanding and acceptance.     Dental advisory given  Plan Discussed with: CRNA and Anesthesiologist  Anesthesia Plan Comments: (Discussed risks/benefits/alternatives to MAC sedation including need for ventilatory support, hypotension, need for conversion to general anesthesia.  All patient  questions answered.  Patient/guardian wishes to proceed.)        Anesthesia Quick Evaluation

## 2019-02-01 NOTE — Op Note (Signed)
[facility] Patient Name: [name] [name] Procedure Date: 02/01/2019 MRN: [id number] Attending MD: Milus Banister , MD Date of Birth: Dec 17, 1950 CSN: [id number] Age: 68 Admit Type: Outpatient Procedure:                Upper EUS Indications:              PET avid upper paratracheal lymph nodes in setting                            of recently diagnosed HN cancer. Providers:                Milus Banister, MD, Cleda Daub, RN, William Dalton, Technician Referring MD:             Dr. Maylon Peppers Medicines:                General Anesthesia, via trach Complications:            No immediate complications. Estimated blood loss:                            None. Estimated Blood Loss:     Estimated blood loss: none. Procedure:                Pre-Anesthesia Assessment:                           - Prior to the procedure, a History and Physical                            was performed, and patient medications and                            allergies were reviewed. The patient's tolerance of                            previous anesthesia was also reviewed. The risks                            and benefits of the procedure and the sedation                            options and risks were discussed with the patient.                            All questions were answered, and informed consent                            was obtained. Prior Anticoagulants: The patient has                            taken no previous anticoagulant or antiplatelet  agents. ASA Grade Assessment: II - A patient with                            mild systemic disease. After reviewing the risks                            and benefits, the patient was deemed in                            satisfactory condition to undergo the procedure.                           After obtaining informed consent, the endoscope was                            passed under direct  vision. Throughout the                            procedure, the patient's blood pressure, pulse, and                            oxygen saturations were monitored continuously. The                            [id number] ([id number]) Olympus Radial EUS was                            introduced through the mouth, and advanced to the                            body of the stomach. The [id number] ([id number])                            Olympus Linear EUS was introduced through the                            mouth, and advanced to the body of the stomach. The                            upper EUS was accomplished without difficulty. The                            patient tolerated the procedure well. Scope In: Scope Out: Findings:      ENDOSCOPIC FINDING: :      The examined esophagus was endoscopically normal.      The entire examined stomach was endoscopically normal.      ENDOSONOGRAPHIC FINDING: :      1. A cluster of three small upper paratracheal lymph nodes was noted.       They were all round, hypoechoic and homogeneous. The largest was 61mm       across. These were sample with three passes of a 25 gauge EUS FNA needle       using color Doppler to avoid signficant vessels. ). A cytotechnologist  was present to evaluate the adequacy of the specimen. Final cytology       results are pending.      2. Limited evaluation of liver, spleen, pancreas was all normal. Impression:               - A cluster of small but suspicious appearing upper                            paratracheal lymph nodes was noted and sampled with                            EUS FNA. Moderate Sedation:      Not Applicable - Patient had care per Anesthesia. Recommendation:           - Discharge patient to home.                           - Await final cytology. Procedure Code(s):        --- Professional ---                           [id number], 73, Esophagogastroduodenoscopy, flexible,                            transoral;  with transendoscopic ultrasound-guided                            intramural or transmural fine needle                            aspiration/biopsy(s), (includes endoscopic                            ultrasound examination limited to the esophagus,                            stomach or duodenum, and adjacent structures) Diagnosis Code(s):        --- Professional ---                           I89.9, Noninfective disorder of lymphatic vessels                            and lymph nodes, unspecified                           R93.5, Abnormal findings on diagnostic imaging of                            other abdominal regions, including retroperitoneum CPT copyright 2019 [employer]. All rights reserved. The codes documented in this report are preliminary and upon coder review may  be revised to meet current compliance requirements. Milus Banister, MD 02/01/2019 1:30:11 PM This report has been signed electronically. Number of Addenda: 0

## 2019-02-02 ENCOUNTER — Other Ambulatory Visit: Payer: Self-pay | Admitting: Hematology

## 2019-02-02 ENCOUNTER — Ambulatory Visit: Payer: Medicare HMO | Attending: Radiation Oncology

## 2019-02-02 ENCOUNTER — Ambulatory Visit (HOSPITAL_COMMUNITY)
Admission: RE | Admit: 2019-02-02 | Discharge: 2019-02-02 | Disposition: A | Discharge status: Home / Self Care | Payer: Medicare HMO | Source: Ambulatory Visit | Attending: Hematology | Admitting: Hematology

## 2019-02-02 ENCOUNTER — Other Ambulatory Visit: Payer: Self-pay

## 2019-02-02 ENCOUNTER — Encounter (HOSPITAL_COMMUNITY): Payer: Self-pay

## 2019-02-02 ENCOUNTER — Encounter: Payer: Self-pay | Admitting: *Deleted

## 2019-02-02 DIAGNOSIS — Z809 Family history of malignant neoplasm, unspecified: Secondary | ICD-10-CM | POA: Insufficient documentation

## 2019-02-02 DIAGNOSIS — C321 Malignant neoplasm of supraglottis: Secondary | ICD-10-CM

## 2019-02-02 DIAGNOSIS — H269 Unspecified cataract: Secondary | ICD-10-CM | POA: Insufficient documentation

## 2019-02-02 DIAGNOSIS — Z79890 Hormone replacement therapy (postmenopausal): Secondary | ICD-10-CM | POA: Insufficient documentation

## 2019-02-02 DIAGNOSIS — Z87891 Personal history of nicotine dependence: Secondary | ICD-10-CM | POA: Diagnosis not present

## 2019-02-02 DIAGNOSIS — C329 Malignant neoplasm of larynx, unspecified: Secondary | ICD-10-CM | POA: Diagnosis not present

## 2019-02-02 DIAGNOSIS — E079 Disorder of thyroid, unspecified: Secondary | ICD-10-CM | POA: Insufficient documentation

## 2019-02-02 DIAGNOSIS — C760 Malignant neoplasm of head, face and neck: Secondary | ICD-10-CM | POA: Diagnosis not present

## 2019-02-02 DIAGNOSIS — Z452 Encounter for adjustment and management of vascular access device: Secondary | ICD-10-CM | POA: Diagnosis not present

## 2019-02-02 DIAGNOSIS — R1312 Dysphagia, oropharyngeal phase: Secondary | ICD-10-CM

## 2019-02-02 HISTORY — PX: IR IMAGING GUIDED PORT INSERTION: IMG5740

## 2019-02-02 LAB — CBC WITH DIFFERENTIAL/PLATELET
Abs Immature Granulocytes: 0.02 10*3/uL (ref 0.00–0.07)
Basophils Absolute: 0 10*3/uL (ref 0.0–0.1)
Basophils Relative: 0 %
Eosinophils Absolute: 0.5 10*3/uL (ref 0.0–0.5)
Eosinophils Relative: 8 %
HCT: 38.7 % (ref 36.0–46.0)
Hemoglobin: 12.2 g/dL (ref 12.0–15.0)
Immature Granulocytes: 0 %
Lymphocytes Relative: 38 %
Lymphs Abs: 2.6 10*3/uL (ref 0.7–4.0)
MCH: 26.9 pg (ref 26.0–34.0)
MCHC: 31.5 g/dL (ref 30.0–36.0)
MCV: 85.2 fL (ref 80.0–100.0)
Monocytes Absolute: 0.7 10*3/uL (ref 0.1–1.0)
Monocytes Relative: 10 %
Neutro Abs: 3.1 10*3/uL (ref 1.7–7.7)
Neutrophils Relative %: 44 %
Platelets: 382 10*3/uL (ref 150–400)
RBC: 4.54 MIL/uL (ref 3.87–5.11)
RDW: 14.3 % (ref 11.5–15.5)
WBC: 7 10*3/uL (ref 4.0–10.5)
nRBC: 0 % (ref 0.0–0.2)

## 2019-02-02 LAB — PROTIME-INR
INR: 1 (ref 0.8–1.2)
Prothrombin Time: 12.7 seconds (ref 11.4–15.2)

## 2019-02-02 IMAGING — US IR LEFT FLUORO GUIDE CV LINE
1 series · 1 of 1 positions shown · non-contrast
Comparison: PET-CT - [DATE]

INDICATION: History of metastatic head neck cancer, in need of durable
intravenous access for chemotherapy administration.

EXAM:
IMPLANTED PORT A CATH PLACEMENT WITH ULTRASOUND AND FLUOROSCOPIC
GUIDANCE

[Series 1: ir fluoro/shunt/fist · 1 of 1 slices shown]
[im 1/1]
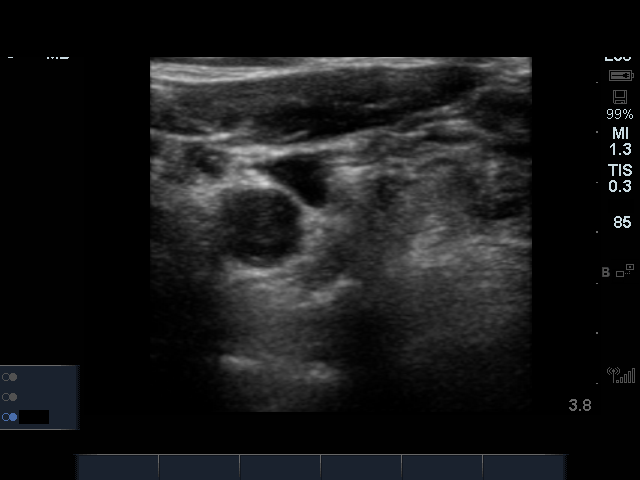

[1 of 1 positions shown; findings below may reference images not displayed]

MEDICATIONS:
Ancef 2 gm IV; The antibiotic was administered within an appropriate
time interval prior to skin puncture.

ANESTHESIA/SEDATION:
Moderate (conscious) sedation was employed during this procedure. A
total of Versed 2 mg and Fentanyl 100 mcg was administered
intravenously.

Moderate Sedation Time: 23 minutes. The patient's level of
consciousness and vital signs were monitored continuously by
radiology nursing throughout the procedure under my direct
supervision.

CONTRAST:  None

FLUOROSCOPY TIME:  24 seconds (6 mGy)

COMPLICATIONS:
None immediate.

PROCEDURE:
The procedure, risks, benefits, and alternatives were explained to
the patient. Questions regarding the procedure were encouraged and
answered. The patient understands and consents to the procedure.

The right neck and chest were prepped with chlorhexidine in a
sterile fashion, and a sterile drape was applied covering the
operative field. Maximum barrier sterile technique with sterile
gowns and gloves were used for the procedure. A timeout was
performed prior to the initiation of the procedure. Local anesthesia
was provided with 1% lidocaine with epinephrine.

After creating a small venotomy incision, a micropuncture kit was
utilized to access the internal jugular vein. Real-time ultrasound
guidance was utilized for vascular access including the acquisition
of a permanent ultrasound image documenting patency of the accessed
vessel. The microwire was utilized to measure appropriate catheter
length.

A subcutaneous port pocket was then created along the upper chest
wall utilizing a combination of sharp and blunt dissection. The
pocket was irrigated with sterile saline. A single lumen slim power
injectable port was chosen for placement. The 8 Fr catheter was
tunneled from the port pocket site to the venotomy incision. The
port was placed in the pocket. The external catheter was trimmed to
appropriate length. At the venotomy, an 8 Fr peel-away sheath was
placed over a guidewire under fluoroscopic guidance. The catheter
was then placed through the sheath and the sheath was removed. Final
catheter positioning was confirmed and documented with a
fluoroscopic spot radiograph. The port was accessed with LAVOE
needle, aspirated and flushed with heparinized saline.

The venotomy site was closed with an interrupted 4-0 Vicryl suture.
The port pocket incision was closed with interrupted 2-0 Vicryl
suture and the skin was opposed with a running subcuticular 4-0
Vicryl suture. Dermabond and LAVOE were applied to both
incisions. Dressings were placed. The patient tolerated the
procedure well without immediate post procedural complication.
FINDINGS: After catheter placement, the tip lies within the superior
cavoatrial junction. The catheter aspirates and flushes normally and
is ready for immediate use.
IMPRESSION: Successful placement of a right internal jugular approach power
injectable Port-A-Cath. The catheter is ready for immediate use.

## 2019-02-02 IMAGING — XA IR LEFT FLUORO GUIDE CV LINE
1 series · 1 of 1 positions shown · non-contrast
Comparison: PET-CT - [DATE]

INDICATION: History of metastatic head neck cancer, in need of durable
intravenous access for chemotherapy administration.

EXAM:
IMPLANTED PORT A CATH PLACEMENT WITH ULTRASOUND AND FLUOROSCOPIC
GUIDANCE

[Series 1: care single · 1 of 1 slices shown]
[im 1/1]
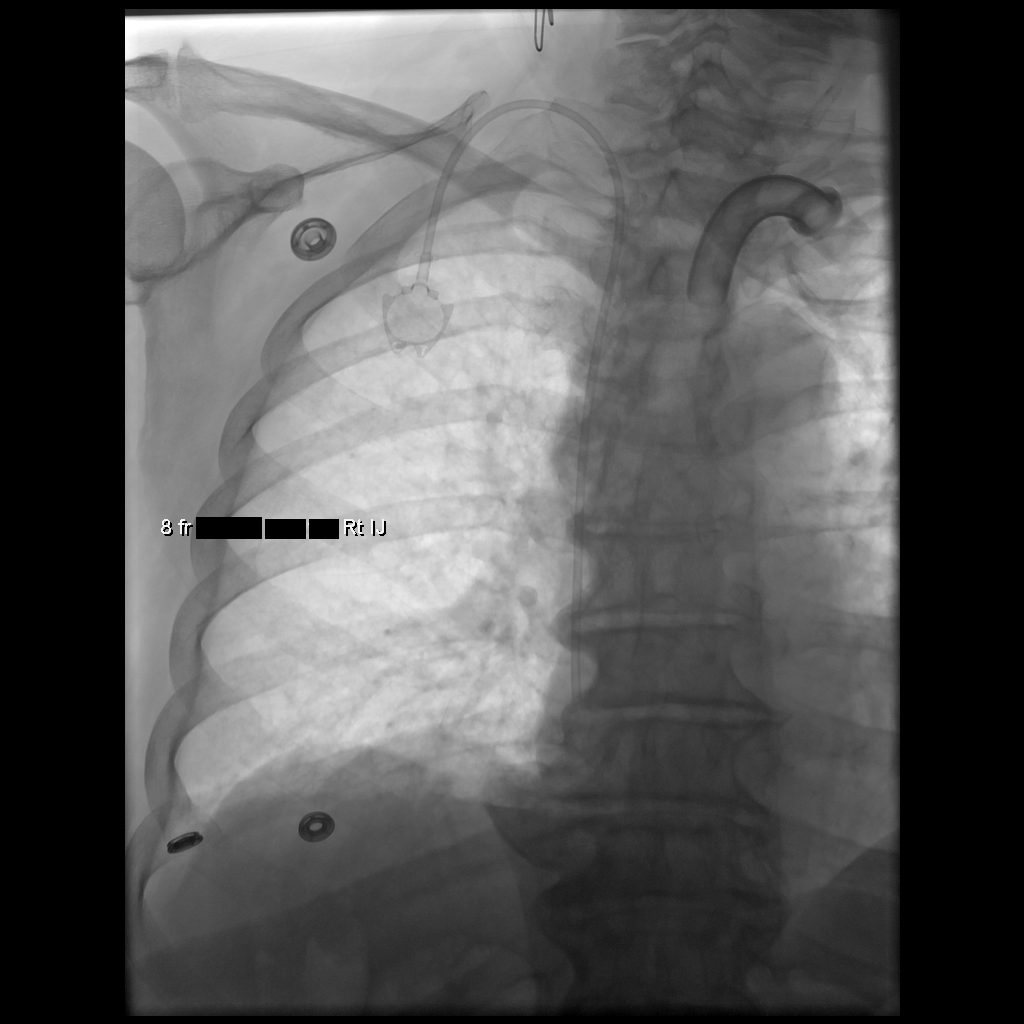

[1 of 1 positions shown; findings below may reference images not displayed]

MEDICATIONS:
Ancef 2 gm IV; The antibiotic was administered within an appropriate
time interval prior to skin puncture.

ANESTHESIA/SEDATION:
Moderate (conscious) sedation was employed during this procedure. A
total of Versed 2 mg and Fentanyl 100 mcg was administered
intravenously.

Moderate Sedation Time: 23 minutes. The patient's level of
consciousness and vital signs were monitored continuously by
radiology nursing throughout the procedure under my direct
supervision.

CONTRAST:  None

FLUOROSCOPY TIME:  24 seconds (6 mGy)

COMPLICATIONS:
None immediate.

PROCEDURE:
The procedure, risks, benefits, and alternatives were explained to
the patient. Questions regarding the procedure were encouraged and
answered. The patient understands and consents to the procedure.

The right neck and chest were prepped with chlorhexidine in a
sterile fashion, and a sterile drape was applied covering the
operative field. Maximum barrier sterile technique with sterile
gowns and gloves were used for the procedure. A timeout was
performed prior to the initiation of the procedure. Local anesthesia
was provided with 1% lidocaine with epinephrine.

After creating a small venotomy incision, a micropuncture kit was
utilized to access the internal jugular vein. Real-time ultrasound
guidance was utilized for vascular access including the acquisition
of a permanent ultrasound image documenting patency of the accessed
vessel. The microwire was utilized to measure appropriate catheter
length.

A subcutaneous port pocket was then created along the upper chest
wall utilizing a combination of sharp and blunt dissection. The
pocket was irrigated with sterile saline. A single lumen slim power
injectable port was chosen for placement. The 8 Fr catheter was
tunneled from the port pocket site to the venotomy incision. The
port was placed in the pocket. The external catheter was trimmed to
appropriate length. At the venotomy, an 8 Fr peel-away sheath was
placed over a guidewire under fluoroscopic guidance. The catheter
was then placed through the sheath and the sheath was removed. Final
catheter positioning was confirmed and documented with a
fluoroscopic spot radiograph. The port was accessed with LAVOE
needle, aspirated and flushed with heparinized saline.

The venotomy site was closed with an interrupted 4-0 Vicryl suture.
The port pocket incision was closed with interrupted 2-0 Vicryl
suture and the skin was opposed with a running subcuticular 4-0
Vicryl suture. Dermabond and LAVOE were applied to both
incisions. Dressings were placed. The patient tolerated the
procedure well without immediate post procedural complication.
FINDINGS: After catheter placement, the tip lies within the superior
cavoatrial junction. The catheter aspirates and flushes normally and
is ready for immediate use.
IMPRESSION: Successful placement of a right internal jugular approach power
injectable Port-A-Cath. The catheter is ready for immediate use.

## 2019-02-02 MED ORDER — LIDOCAINE-EPINEPHRINE 1 %-1:100000 IJ SOLN
INTRAMUSCULAR | Status: AC | PRN
  Administered 2019-02-02: 5 mL

## 2019-02-02 MED ORDER — MIDAZOLAM HCL 2 MG/2ML IJ SOLN
INTRAMUSCULAR | Status: AC
  Filled 2019-02-02: qty 4

## 2019-02-02 MED ORDER — MIDAZOLAM HCL 2 MG/2ML IJ SOLN
INTRAMUSCULAR | Status: AC | PRN
  Administered 2019-02-02 (×2): 1 mg via INTRAVENOUS

## 2019-02-02 MED ORDER — HEPARIN SOD (PORK) LOCK FLUSH 100 UNIT/ML IV SOLN
INTRAVENOUS | Status: AC | PRN
  Administered 2019-02-02: 500 [IU] via INTRAVENOUS

## 2019-02-02 MED ORDER — CEFAZOLIN SODIUM-DEXTROSE 2-4 GM/100ML-% IV SOLN
INTRAVENOUS | Status: AC
  Administered 2019-02-02: 2 g via INTRAVENOUS
  Filled 2019-02-02: qty 100

## 2019-02-02 MED ORDER — HEPARIN SOD (PORK) LOCK FLUSH 100 UNIT/ML IV SOLN
INTRAVENOUS | Status: AC
  Filled 2019-02-02: qty 5

## 2019-02-02 MED ORDER — LIDOCAINE-EPINEPHRINE 1 %-1:100000 IJ SOLN
INTRAMUSCULAR | Status: AC
  Filled 2019-02-02: qty 1

## 2019-02-02 MED ORDER — SODIUM CHLORIDE 0.9 % IV SOLN
INTRAVENOUS | Status: DC
  Administered 2019-02-02: 13:00:00 via INTRAVENOUS

## 2019-02-02 MED ORDER — CEFAZOLIN SODIUM-DEXTROSE 2-4 GM/100ML-% IV SOLN
2.0000 g | INTRAVENOUS | Status: AC
  Administered 2019-02-02: 2 g via INTRAVENOUS

## 2019-02-02 MED ORDER — FENTANYL CITRATE (PF) 100 MCG/2ML IJ SOLN
INTRAMUSCULAR | Status: AC | PRN
  Administered 2019-02-02 (×2): 50 ug via INTRAVENOUS

## 2019-02-02 MED ORDER — FENTANYL CITRATE (PF) 100 MCG/2ML IJ SOLN
INTRAMUSCULAR | Status: AC
  Filled 2019-02-02: qty 2

## 2019-02-02 MED ORDER — LIDOCAINE-EPINEPHRINE 1 %-1:100000 IJ SOLN
INTRAMUSCULAR | Status: AC | PRN
  Administered 2019-02-02: 10 mL

## 2019-02-02 NOTE — Patient Instructions (Signed)
SWALLOWING EXERCISES Do these until 6 months after your last day of radiation, then 2-3 times per week afterwards  1. Effortful Swallows - Press your tongue against the roof of your mouth for 3 seconds, then squeeze the muscles in your neck while you swallow your saliva or a sip of water - Repeat 15-20 times, 2-3 times a day, and use whenever you eat or drink  2. Masako Swallow - swallow with your tongue sticking out - Stick tongue out past your teeth and gently bite tongue with your teeth - Swallow, while holding your tongue with your teeth - Repeat 15-20 times, 2-3 times a day *use a wet spoon if your mouth gets dry*  3. Chin pushback - Open your mouth  - Place your fist UNDER your chin near your neck, and push back with your fist for 5 seconds - Repeat 8-10 times, 2-3 times a day

## 2019-02-02 NOTE — Progress Notes (Signed)
START ON PATHWAY REGIMEN - Head and Neck     A cycle is every 7 days:     Cisplatin   **Always confirm dose/schedule in your pharmacy ordering system**  Patient Characteristics: Larynx, Stage III, IVA, IVB; Unresectable Disease Classification: Larynx Current Disease Status: No Distant Metastases and No Recurrent Disease AJCC T Category: T2 AJCC 8 Stage Grouping: IVA AJCC N Category: cN2c AJCC M Category: M0 Intent of Therapy: Curative Intent, Discussed with Patient

## 2019-02-02 NOTE — Discharge Instructions (Signed)
Do not use EMLA cream over the skin glue (dermabond) of your new port until the cancer center nurses tell you that it has healed. The petroleum in the EMLA cream will dissolve the skin glue and the incision will come apart. Use ice in a ziplock bag for 3-4 minutes prior to nurses accessing your port.   Implanted Port Insertion, Care After This sheet gives you information about how to care for yourself after your procedure. Your health care provider may also give you more specific instructions. If you have problems or questions, contact your health care provider. What can I expect after the procedure? After the procedure, it is common to have:  Discomfort at the port insertion site.  Bruising on the skin over the port. This should improve over 3-4 days. Follow these instructions at home: [facility] care  After your port is placed, you will get a manufacturer's information card. The card has information about your port. Keep this card with you at all times.  Take care of the port as told by your health care provider. Ask your health care provider if you or a family member can get training for taking care of the port at home. A home health care nurse may also take care of the port.  Make sure to remember what type of port you have. Incision care      Follow instructions from your health care provider about how to take care of your port insertion site. Make sure you: ? Wash your hands with soap and water before and after you change your bandage (dressing). If soap and water are not available, use hand sanitizer. ? Change your dressing as told by your health care provider. ? Leave stitches (sutures), skin glue, or adhesive strips in place. These skin closures may need to stay in place for 2 weeks or longer. If adhesive strip edges start to loosen and curl up, you may trim the loose edges. Do not remove adhesive strips completely unless your health care provider tells you to do that.  Check your  port insertion site every day for signs of infection. Check for: ? Redness, swelling, or pain. ? Fluid or blood. ? Warmth. ? Pus or a bad smell. Activity  Return to your normal activities as told by your health care provider. Ask your health care provider what activities are safe for you.  Do not lift anything that is heavier than 10 lb (4.5 kg), or the limit that you are told, until your health care provider says that it is safe. General instructions  Take over-the-counter and prescription medicines only as told by your health care provider.  Do not take baths, swim, or use a hot tub until your health care provider approves. Ask your health care provider if you may take showers. You may only be allowed to take sponge baths.  Do not drive for 24 hours if you were given a sedative during your procedure.  Wear a medical alert bracelet in case of an emergency. This will tell any health care providers that you have a port.  Keep all follow-up visits as told by your health care provider. This is important. Contact a health care provider if:  You cannot flush your port with saline as directed, or you cannot draw blood from the port.  You have a fever or chills.  You have redness, swelling, or pain around your port insertion site.  You have fluid or blood coming from your port insertion site.  Your  port insertion site feels warm to the touch.  You have pus or a bad smell coming from the port insertion site. Get help right away if:  You have chest pain or shortness of breath.  You have bleeding from your port that you cannot control. Summary  Take care of the port as told by your health care provider. Keep the manufacturer's information card with you at all times.  Change your dressing as told by your health care provider.  Contact a health care provider if you have a fever or chills or if you have redness, swelling, or pain around your port insertion site.  Keep all follow-up  visits as told by your health care provider. This information is not intended to replace advice given to you by your health care provider. Make sure you discuss any questions you have with your health care provider. Document Released: 06/30/2013 Document Revised: 04/07/2018 Document Reviewed: 04/07/2018 Elsevier Interactive Patient Education  2019 [place].   Moderate Conscious Sedation, Adult, Care After These instructions provide you with information about caring for yourself after your procedure. Your health care provider may also give you more specific instructions. Your treatment has been planned according to current medical practices, but problems sometimes occur. Call your health care provider if you have any problems or questions after your procedure. What can I expect after the procedure? After your procedure, it is common:  To feel sleepy for several hours.  To feel clumsy and have poor balance for several hours.  To have poor judgment for several hours.  To vomit if you eat too soon. Follow these instructions at home: For at least 24 hours after the procedure:   Do not: ? Participate in activities where you could fall or become injured. ? Drive. ? Use heavy machinery. ? Drink alcohol. ? Take sleeping pills or medicines that cause drowsiness. ? Make important decisions or sign legal documents. ? Take care of children on your own.  Rest. Eating and drinking  Follow the diet recommended by your health care provider.  If you vomit: ? Drink water, juice, or soup when you can drink without vomiting. ? Make sure you have little or no nausea before eating solid foods. General instructions  Have a responsible adult stay with you until you are awake and alert.  Take over-the-counter and prescription medicines only as told by your health care provider.  If you smoke, do not smoke without supervision.  Keep all follow-up visits as told by your health care provider.  This is important. Contact a health care provider if:  You keep feeling nauseous or you keep vomiting.  You feel light-headed.  You develop a rash.  You have a fever. Get help right away if:  You have trouble breathing. This information is not intended to replace advice given to you by your health care provider. Make sure you discuss any questions you have with your health care provider. Document Released: 06/30/2013 Document Revised: 02/12/2016 Document Reviewed: 12/30/2015 Elsevier Interactive Patient Education  2019 [employer].

## 2019-02-02 NOTE — Therapy (Signed)
[facility] [address] [place], [place], [address] Phone: [phone]   Fax:  [phone]  Speech Language Pathology Evaluation  Patient Details  Name: [name] [name] MRN: [id number] Date of Birth: Jan 21, 1951 Referring Provider (SLP): Eppie Gibson, MD   Encounter Date: 02/02/2019  End of Session - 02/02/19 1608    Visit Number  1    Number of Visits  4    Date for SLP Re-Evaluation  05/03/19   90 days   SLP Start Time  1024    SLP Stop Time   1105    SLP Time Calculation (min)  41 min    Activity Tolerance  Patient tolerated treatment well       Past Medical History:  Diagnosis Date  . Cataract   . Laryngeal cancer ([place]) 11/2018  . Thyroid disease     Past Surgical History:  Procedure Laterality Date  . CATARACT EXTRACTION, BILATERAL  left 05/20/16, right 06/03/16   Cataract Extraction w/ IOL Implant; Surgeon: Sudie Grumbling, MD; Location: [facility]; Service: Ophthalmology   . CESAREAN SECTION    . ESOPHAGOGASTRODUODENOSCOPY (EGD) WITH PROPOFOL N/A 02/01/2019   Procedure: ESOPHAGOGASTRODUODENOSCOPY (EGD) WITH PROPOFOL;  Surgeon: Milus Banister, MD;  [facility];  Service: Endoscopy;  Laterality: N/A;  . EUS N/A 02/01/2019   Procedure: UPPER ENDOSCOPIC ULTRASOUND (EUS) RADIAL-Will need Linear and Forward Viewing EUS Scope;  Surgeon: Milus Banister, MD;  [facility];  Service: Endoscopy;  Laterality: N/A;  . FINE NEEDLE ASPIRATION N/A 02/01/2019   Procedure: FINE NEEDLE ASPIRATION (FNA) LINEAR;  Surgeon: Milus Banister, MD;  [facility];  Service: Endoscopy;  Laterality: N/A;  . KNEE CARTILAGE SURGERY Right 2019  . TRACHEOSTOMY  12/02/2018   [facility]    There were no vitals filed for this visit.      SLP Evaluation [facility] - 02/02/19 1557      SLP Visit Information   SLP Received On  02/02/19    Referring Provider (SLP)  Eppie Gibson, MD    Onset Date   March 2020, hospitalization    Medical Diagnosis  Supraglottic SCC      Subjective   Patient/Family Stated Goal  maintain functional swallowing      General Information   HPI  Pt [facility] ED with repiratory distress and obvious stridor on 12-02-18. Pt airlifted to [facility] and rec'd trach at that site. PET 01-27-19 locally avanced laryngeal neoplasm with bil cervical lymph node involvement and mediastinal nodes. Biopsy of medistinal nodes Friday. [facility] surgeon suggested laryngectomee by pt decided upon chemo with likely RT instead; final treatment plan decided after biopsy. Pt has lost 30 lbs but in last 2 weeks has gained 4 lbs back. On pureed/thin at this time, pt without PMV at this time.      Prior Functional Status   Cognitive/Linguistic Baseline  Within functional limits      Cognition   Overall Cognitive Status  Within Functional Limits for tasks assessed      Oral Motor/Sensory Function   Overall Oral Motor/Sensory Function  --   unable to assess due to telephone visit   Overall Oral Motor/Sensory Function  SLP attempted occluded vocalization and pt mildly successful with severely harsh and dry-sounding voice. Pt with successful verbal communicaiton approx 50% of the time. Decr'd intelligbility due to intermittent vocalization as well as reduced vocal quality.      Motor Speech   Overall Motor Speech  Other (comment)   unable to assess due to telephone visit     Pt currently tolerates pureed diet with thin liquids, she expectorates any "chunks" that may be present in her puree, as she reports difficulty with pharyngeal clearance even with liquid wash.   Because data states the risk for dysphagia during and after radiation treatment is high due to undergoing radiation tx, SLP taught pt and [name] (partner) about the possibility of reduced/limited ability for PO intake during rad tx. SLP encouraged pt to continue swallowing POs as far into rad tx as possible, even ingesting POs  and/or completing HEP shortly after administration of pain meds.   SLP educated pt re: changes to swallowing musculature after rad tx, and why adherence to dysphagia HEP provided today and PO consumption was necessary to inhibit muscular disuse atrophy and to reduce muscle fibrosis following rad tx. Pt demonstrated understanding of these things to SLP via [facility] or verbally using finger occlusion of her trach.    SLP then explained a HEP for pt consisting of effortful and Masako swallows, and chin tuck against resistance. SLP  instructed pt how to perform exercises involving lingual and pharyngeal strengthening. SLP explained each exercise to pt and partner and pt demonstrated understanding of each exercise. SLP ensured as best as possible via telephone that pt performance was correct prior to moving on to next exercise. Pt was instructed to complete this program x2-3 times a day,  until 6 months after her last rad tx, then x2-3 a week after that.                 SLP Education - 02/02/19 1607    Education Details  late effects head/neck radiation on swallow function, rationale for HEP    Person(s) Educated  Patient;Caregiver(s)    Methods  Explanation;Handout    Comprehension  Verbalized understanding;Need further instruction       SLP Short Term Goals - 02/02/19 1611      SLP SHORT TERM GOAL #1   Title  pt will demo understanding of correct procedure for swallow HEP with rare min A over two sessions    Time  2    Period  --   sessions, for all STGs   Status  New      SLP SHORT TERM GOAL #2   Title  pt will tell SLP why she is completing HEP over two sessions    Time  2    Status  New      SLP SHORT TERM GOAL #3   Title  pt will tell SLP 3 overt s/s aspiration PNA    Time  2    Status  New       SLP Long Term Goals - 02/02/19 1614      SLP LONG TERM GOAL #1   Title  pt will demo understanding of correct procedure for swallow HEP over two sessions    Time  3     Period  --   sessions, for all LTGs   Status  New      SLP LONG TERM GOAL #2   Title  pt will tell how a food journal can assist return to a full-PO diet least restrictive to pt    Time  3    Status  New       Plan - 02/02/19 1608    Clinical Impression Statement  This visit was completed via telephone at the direction of medical director Dr.  Eppie Gibson due to additional pt safety necessary from the COVID-19 pandemic. Pt is currently on pureed/thin diet and does not report issues other than not tolerating "chunks" in the puree, which she expectorating at this time instead of swallowing. No overt s/s aspiration were reported with this diet. No overt s/s aspiration PNA reported by pt today. The probability of swallowing difficulty increases dramatically with the initiation of chemo and radiation therapy. Pt will need to be followed by SLP for regular assessment of accurate HEP completion as well as for safety with POs both during and following treatment/s. Visits will be conducted via telephone or telehealth, remotely, until otherwise directed by medical director Dr. Eppie Gibson.    Speech Therapy Frequency  --   approx once every 4 weeks   Duration  --   3-4 visits in 90 days   Treatment/Interventions  Aspiration precaution training;Pharyngeal strengthening exercises;Diet toleration management by SLP;Compensatory techniques;SLP instruction and feedback;Patient/family education    Potential to Achieve Goals  Good    SLP Home Exercise Plan  provided today - Effortful, Masako, and chin tuck against resistance    Consulted and Agree with Plan of Care  Patient       Patient will benefit from skilled therapeutic intervention in order to improve the following deficits and impairments:   Dysphagia, oropharyngeal phase - Plan: SLP plan of care cert/re-cert    Problem List Patient Active Problem List   Diagnosis Date Noted  . Malignant neoplasm of supraglottis ([place]) 01/18/2019     [facility] ,[place], [place]  02/02/2019, 4:17 PM  [facility] [address] [place], [place], [address] Phone: [phone]   Fax:  [phone]  Name: JAMARA VARY MRN: [id number] Date of Birth: 08/29/1951

## 2019-02-02 NOTE — Procedures (Signed)
Pre Procedure Dx: Head and Neck Cancer Post Procedural Dx: Same  Successful placement of right IJ approach port-a-cath with tip at the superior caval atrial junction. The catheter is ready for immediate use.  Estimated Blood Loss: Minimal  Complications: None immediate.  Ronny Bacon, MD Pager #: [phone]

## 2019-02-02 NOTE — Consult Note (Signed)
Chief Complaint: Patient was seen in consultation today for Port-A-Cath placement  Referring Physician(s): Zhao,Yan  Supervising Physician: [name]  Patient Status: [place]  History of Present Illness: [name] [name] is a 68 y.o. female, ex-smoker, with history of squamous cell carcinoma of the larynx diagnosed in March of this year with prior trach at [facility].  She presents today for Port-A-Cath placement for chemotherapy.  Past Medical History:  Diagnosis Date   Cataract    Laryngeal cancer ([place]) 11/2018   Thyroid disease     Past Surgical History:  Procedure Laterality Date   CATARACT EXTRACTION, BILATERAL  left 05/20/16, right 06/03/16   Cataract Extraction w/ IOL Implant; Surgeon: Sudie Grumbling, MD; Location: [facility]; Service: Ophthalmology    CESAREAN SECTION     ESOPHAGOGASTRODUODENOSCOPY (EGD) WITH PROPOFOL N/A 02/01/2019   Procedure: ESOPHAGOGASTRODUODENOSCOPY (EGD) WITH PROPOFOL;  Surgeon: Milus Banister, MD;  [facility];  Service: Endoscopy;  Laterality: N/A;   EUS N/A 02/01/2019   Procedure: UPPER ENDOSCOPIC ULTRASOUND (EUS) RADIAL-Will need Linear and Forward Viewing EUS Scope;  Surgeon: Milus Banister, MD;  [facility];  Service: Endoscopy;  Laterality: N/A;   FINE NEEDLE ASPIRATION N/A 02/01/2019   Procedure: FINE NEEDLE ASPIRATION (FNA) LINEAR;  Surgeon: Milus Banister, MD;  [facility];  Service: Endoscopy;  Laterality: N/A;   KNEE CARTILAGE SURGERY Right 2019   TRACHEOSTOMY  12/02/2018   [facility]    Allergies: Patient has no known allergies.  Medications: Prior to Admission medications   Medication Sig Start Date End Date Taking? Authorizing Provider  chlorhexidine (PERIDEX) 0.12 % solution Rinse with 15 mls twice daily for 30 seconds. Use after breakfast and at bedtime. Spit out excess. Do not swallow. Patient taking differently: Use as directed 15 mLs in the mouth or throat 2 (two)  times daily. Rinse with 15 mls twice daily for 30 seconds. Use after breakfast and at bedtime. Spit out excess. Do not swallow. 01/25/19  Yes Lenn Cal, DDS  docusate sodium (COLACE) 100 MG capsule Take 100 mg by mouth 2 (two) times daily.   Yes [provider]  ibuprofen (ADVIL) 800 MG tablet Take 400-800 mg by mouth 3 (three) times daily as needed (knee pain).  11/19/18  Yes [provider]  levothyroxine (SYNTHROID) 150 MCG tablet Take 150 mcg by mouth every other day. Alternating with 175 mcg dose 01/08/19  Yes [provider]  levothyroxine (SYNTHROID) 175 MCG tablet Take 175 mcg by mouth every other day. Alternating with 150 mcg dose 01/08/19  Yes [provider]  sodium fluoride (PREVIDENT 5000 PLUS) 1.1 % CREA dental cream Apply cream to tooth brush. Brush teeth for 2 minutes. Spit out excess. DO NOT rinse afterwards. Repeat nightly. Patient taking differently: Place 1 application onto teeth 2 (two) times a day.  01/25/19  Yes Lenn Cal, DDS     Family History  Problem Relation Age of Onset   Cancer Mother    Heart attack Father     Social History   Socioeconomic History   Marital status: Significant Other    Spouse name: [name]   Number of children: 1   Years of education: Not on file   Highest education level: Not on file  Occupational History   Not on file  Social Needs   Financial resource strain: Not on file   Food insecurity:    Worry: Not on file    Inability: Not on  file   Transportation needs:    Medical: No    Non-medical: No  Tobacco Use   Smoking status: Former Smoker    Types: Cigarettes    Last attempt to quit: 12/02/2018    Years since quitting: 0.1   Smokeless tobacco: Never Used  Substance and Sexual Activity   Alcohol use: Not Currently    Frequency: Never   Drug use: Never   Sexual activity: Not on file  Lifestyle   Physical activity:    Days per week: Not on file    Minutes  per session: Not on file   Stress: Not on file  Relationships   Social connections:    Talks on phone: Not on file    Gets together: Not on file    Attends religious service: Not on file    Active member of club or organization: Not on file    Attends meetings of clubs or organizations: Not on file    Relationship status: Not on file  Other Topics Concern   Not on file  Social History Narrative   Not on file      Review of Systems denies fever, headache, chest pain, dyspnea, abdominal/back pain, nausea, vomiting or bleeding. She does have occasional coughing as well as dysphagia   Vital Signs: BP 113/78    Pulse 78    Temp 98 F (36.7 C) (Oral)    Resp 18    SpO2 100%   Physical Exam awake, alert.  Trach in place.  Chest clear to auscultation bilaterally.  Heart with regular rate and rhythm.  Abdomen soft, positive bowel sounds, nontender.  No lower extremity edema.  Imaging: Nm Pet Image Initial (pi) Skull Base To Thigh  Result Date: 01/26/2019 CLINICAL DATA:  Initial treatment strategy for supraglottic laryngeal cancer. EXAM: NUCLEAR MEDICINE PET SKULL BASE TO THIGH TECHNIQUE: 8.1 mCi F-18 FDG was injected intravenously. Full-ring PET imaging was performed from the skull base to thigh after the radiotracer. CT data was obtained and used for attenuation correction and anatomic localization. Fasting blood glucose: 92 mg/dl COMPARISON:  None. FINDINGS: Mediastinal blood pool activity: SUV max 2.4 NECK: There is a locally advanced hypermetabolic laryngeal mass measuring approximately 4.4 x 4.0 cm with max SUV 15.4 centered in the left supraglottic space, with bilateral glottic and epiglottic involvement and with subglottic extension on the left. Bilateral hypermetabolic level 3 lymph nodes in the neck, for example 1.1 cm on the right with max SUV 6.3 (series 4/image 21) and 1.3 cm on the left with max SUV 5.7 (series 4/image 22). Incidental CT findings: Tracheostomy tube terminates in  tracheal lumen at the level of the thoracic inlet. CHEST: There are mildly enlarged hypermetabolic high mediastinal lymph node between the trachea and upper thoracic esophagus, measuring 1.1 cm on the right with max SUV 3.9 (series 4/image 34) and 1.0 cm on the left with max SUV 3.8 (series 4/image 36). No enlarged or hypermetabolic axillary or hilar lymph nodes. No hypermetabolic pulmonary findings. Incidental CT findings: Atherosclerotic nonaneurysmal thoracic aorta. Nodular 7 mm peripheral basilar left lower lobe focus (series 8/image 41), below PET resolution. No additional significant pulmonary nodules. ABDOMEN/PELVIS: No abnormal hypermetabolic activity within the liver, pancreas, adrenal glands, or spleen. No hypermetabolic lymph nodes in the abdomen or pelvis. Nonspecific hypermetabolism at anorectal junction with max SUV 4.9, without discrete mass on the noncontrast CT images. Incidental CT findings: Cholelithiasis. Atherosclerotic nonaneurysmal abdominal aorta. SKELETON: No focal hypermetabolic activity to suggest skeletal metastasis. Incidental CT  findings: Marked degenerative changes throughout the spine. IMPRESSION: 1. Locally advanced hypermetabolic laryngeal neoplasm centered in the left supraglottic space with bilateral glottic and epiglottic involvement and subglottic extension on the left. 2. Hypermetabolic bilateral level 3 neck nodal metastases. 3. Hypermetabolic high mediastinal nodal metastases between the trachea and upper thoracic esophagus. 4. Otherwise no distant hypermetabolic metastatic disease. 5. Nodular 7 mm focus at the left lung base, below PET resolution, recommend attention on follow-up chest CT in 3 months. 6. Chronic findings include: Aortic Atherosclerosis (ICD10-I70.0). Cholelithiasis. Electronically Signed   By: Ilona Sorrel M.D.   On: 01/26/2019 10:28    Labs:  CBC: Recent Labs    01/20/19 0904 02/02/19 1244  WBC 7.4 7.0  HGB 13.2 12.2  HCT 42.0 38.7  PLT 406*  382    COAGS: Recent Labs    02/02/19 1244  INR 1.0    BMP: Recent Labs    12/02/18 1652 01/20/19 0904  NA  --  140  K  --  4.3  CL  --  103  CO2  --  26  GLUCOSE  --  94  BUN  --  12  CALCIUM  --  10.3  CREATININE 0.70 0.78  GFRNONAA  --  >60  GFRAA  --  >60    LIVER FUNCTION TESTS: Recent Labs    01/20/19 0904  BILITOT 0.3  AST 16  ALT 23  ALKPHOS 114  PROT 8.0  ALBUMIN 3.7    TUMOR MARKERS: No results for input(s): AFPTM, CEA, CA199, CHROMGRNA in the last 8760 hours.  Assessment and Plan: 68 y.o. female, ex-smoker, with history of squamous cell carcinoma of the larynx diagnosed in March of this year with prior trach at [facility].  She presents today for Port-A-Cath placement for chemotherapy.Risks and benefits of image guided port-a-catheter placement was discussed with the patient including, but not limited to bleeding, infection, pneumothorax, or fibrin sheath development and need for additional procedures.  All of the patient's questions were answered, patient is agreeable to proceed. Consent signed and in chart.      Thank you for this interesting consult.  I greatly enjoyed meeting [name] [name] and look forward to participating in their care.  A copy of this report was sent to the requesting provider on this date.  Electronically Signed: D. Rowe Robert, PA-C 02/02/2019, 1:19 PM   I spent a total of 20 minutes    in face to face in clinical consultation, greater than 50% of which was counseling/coordinating care for port a cath placement

## 2019-02-03 ENCOUNTER — Telehealth: Payer: Self-pay | Admitting: *Deleted

## 2019-02-03 NOTE — Telephone Encounter (Signed)
Oncology Nurse Navigator Documentation  Called patient, spoke with partner [name]. Informed her:  Appt with ENT Rosen changed from 5/28 1:00 to 5/22 2:45.  PEG educational handout and Epic appt calendar will be given to [facility] when she arrives Monday 5/18 for [facility] She voiced understanding.  Gayleen Orem, RN, BSN Head & Neck Oncology Nurse [place] at [place] [phone]

## 2019-02-03 NOTE — Telephone Encounter (Signed)
Oncology Nurse Navigator Documentation  Received e-mail from ENT Dr. Janeice Robinson MA, Olivia Mackie, indicating pt has appt with Dr. Constance Holster 5/28 for establishing trach support.  Drs. Maylon Peppers and Isidore Moos provided update.  Gayleen Orem, RN, BSN Head & Neck Oncology Nurse [place] at [place] [phone]

## 2019-02-03 NOTE — Telephone Encounter (Signed)
Oncology Nurse Navigator Documentation  Spoke with patient's partner, [name], confirmed understanding of tomorrow's appts:  10:15 tele-MDC call from SLP Glendell Docker Schinke  1:00 arrival to [facility] Radiology for port placement I further explained referral had been placed for Ms. Fiance to be seen by ENT Rosen,[facility], fpr trach support, they should be receiving call to schedule appt. She voiced understanding of information provided.  Gayleen Orem, RN, BSN Head & Neck Oncology Nurse [place] at [place] [phone]

## 2019-02-03 NOTE — Telephone Encounter (Signed)
Oncology Nurse Navigator Documentation  Spoke with Bethanne Ginger, [place] ([facility]), requested delivery of Start of Care Bolus Feeding Kit to [facility] in support of patient's 5/19 PEG placement [facility]. He voiced understanding.  Gayleen Orem, RN, BSN Head & Neck Oncology Nurse [place] at [place] [phone]

## 2019-02-03 NOTE — Progress Notes (Signed)
Oncology Nurse Navigator Documentation  In support of COVID-19 mitigation practices, Ms. Mcinroy participated in this morning's H&N [place] via telephone and received scheduled call from SLP.  She was previously contacted 5/5 by Nutrition.  She will be referred to PT Lymphedema post-tmt as needed.  Gayleen Orem, RN, BSN Head & Neck Oncology Nurse [place] at [place] [phone]

## 2019-02-03 NOTE — Telephone Encounter (Signed)
Oncology Nurse Navigator Documentation  Spoke with Ms. Malan's partner, [name], re understanding of recently scheduled PEG placement next Wed with pre-procedure COVID screening this Friday.  She voiced understanding, confirmed receipt of barium sulfate yesterday.  I reviewed additional appts for next week, she confirmed understanding.  She voiced understanding she will receive PEG educational handouts next Monday when she arrives for CT SIM.  I explained pending delivery of Bolus Feeding Starter Kit to [facility] next week when PEG is placed.   She voiced understanding of information provided.  Gayleen Orem, RN, BSN Head & Neck Oncology Nurse [place] at [place] [phone]

## 2019-02-03 NOTE — Telephone Encounter (Signed)
Done

## 2019-02-03 NOTE — Telephone Encounter (Signed)
Would you mind letting [facility] know the appt date and time?  Thank you.  Dr. Maylon Peppers

## 2019-02-05 ENCOUNTER — Telehealth: Payer: Self-pay | Admitting: *Deleted

## 2019-02-05 ENCOUNTER — Encounter (HOSPITAL_COMMUNITY): Admission: RE | Payer: Self-pay | Source: Home / Self Care

## 2019-02-05 ENCOUNTER — Ambulatory Visit: Payer: Medicare HMO | Admitting: Radiation Oncology

## 2019-02-05 ENCOUNTER — Other Ambulatory Visit (HOSPITAL_COMMUNITY)
Admission: RE | Admit: 2019-02-05 | Discharge: 2019-02-05 | Disposition: A | Discharge status: Home / Self Care | Payer: Medicare HMO | Source: Ambulatory Visit | Attending: Pulmonary Disease | Admitting: Pulmonary Disease

## 2019-02-05 ENCOUNTER — Ambulatory Visit (HOSPITAL_COMMUNITY): Admission: RE | Admit: 2019-02-05 | Payer: Medicare HMO | Source: Home / Self Care | Admitting: Pulmonary Disease

## 2019-02-05 DIAGNOSIS — Z1159 Encounter for screening for other viral diseases: Secondary | ICD-10-CM | POA: Insufficient documentation

## 2019-02-05 SURGERY — BRONCHOSCOPY, WITH EBUS
Anesthesia: Monitor Anesthesia Care

## 2019-02-05 NOTE — Telephone Encounter (Signed)
Oncology Nurse Navigator Documentation  Returned pt's SO's VMM re removal of dsg on PAC placed this past Tuesday.   Instructed OK to::  Remove Tegaderm and gauze, leave liquid band aid intact, explained it will come off gradually with washing.    Remove band aid above PAC.  To gently wash areas with mild soap and water. We discussed setting up WebEx early next week for PEG education.  Gayleen Orem, RN, BSN Head & Neck Oncology Nurse [place] at [place] [phone]

## 2019-02-06 DIAGNOSIS — J387 Other diseases of larynx: Secondary | ICD-10-CM | POA: Diagnosis not present

## 2019-02-06 DIAGNOSIS — J383 Other diseases of vocal cords: Secondary | ICD-10-CM | POA: Diagnosis not present

## 2019-02-06 DIAGNOSIS — Z430 Encounter for attention to tracheostomy: Secondary | ICD-10-CM | POA: Diagnosis not present

## 2019-02-06 LAB — NOVEL CORONAVIRUS, NAA (HOSP ORDER, SEND-OUT TO REF LAB; TAT 18-24 HRS): SARS-CoV-2, NAA: NOT DETECTED

## 2019-02-08 ENCOUNTER — Ambulatory Visit
Admission: RE | Admit: 2019-02-08 | Discharge: 2019-02-08 | Disposition: A | Discharge status: Home / Self Care | Payer: Medicare HMO | Source: Ambulatory Visit | Attending: Radiation Oncology | Admitting: Radiation Oncology

## 2019-02-08 ENCOUNTER — Ambulatory Visit: Payer: Medicare HMO | Admitting: Radiation Oncology

## 2019-02-08 ENCOUNTER — Other Ambulatory Visit: Payer: Self-pay

## 2019-02-08 ENCOUNTER — Ambulatory Visit: Payer: Medicare HMO

## 2019-02-08 ENCOUNTER — Other Ambulatory Visit: Payer: Self-pay | Admitting: Radiology

## 2019-02-08 ENCOUNTER — Other Ambulatory Visit: Payer: Self-pay | Admitting: Hematology

## 2019-02-08 VITALS — BP 113/63 | HR 75 | Temp 98.5°F | Wt 164.6 lb

## 2019-02-08 DIAGNOSIS — C321 Malignant neoplasm of supraglottis: Secondary | ICD-10-CM | POA: Diagnosis not present

## 2019-02-08 DIAGNOSIS — Z510 Encounter for antineoplastic radiation therapy: Secondary | ICD-10-CM | POA: Insufficient documentation

## 2019-02-08 MED ORDER — LIDOCAINE-PRILOCAINE 2.5-2.5 % EX CREA: TOPICAL_CREAM | CUTANEOUS | 3 refills | Status: DC

## 2019-02-08 MED ORDER — SODIUM CHLORIDE 0.9% FLUSH
10.0000 mL | Freq: Once | INTRAVENOUS | Status: AC
  Administered 2019-02-08: 11:00:00 10 mL via INTRAVENOUS

## 2019-02-08 MED ORDER — PROCHLORPERAZINE MALEATE 10 MG PO TABS: 10.0000 mg | ORAL_TABLET | Freq: Four times a day (QID) | ORAL | 1 refills | Status: DC | PRN

## 2019-02-08 MED ORDER — LIDOCAINE-PRILOCAINE 2.5-2.5 % EX CREA
TOPICAL_CREAM | Freq: Once | CUTANEOUS | Status: AC
  Administered 2019-02-08: 11:00:00 via TOPICAL
  Filled 2019-02-08: qty 5

## 2019-02-08 MED ORDER — SODIUM CHLORIDE 0.9% FLUSH
10.0000 mL | Freq: Once | INTRAVENOUS | Status: AC
  Administered 2019-02-08: 12:00:00 10 mL via INTRAVENOUS

## 2019-02-08 MED ORDER — HEPARIN SOD (PORK) LOCK FLUSH 100 UNIT/ML IV SOLN
500.0000 [IU] | Freq: Once | INTRAVENOUS | Status: AC
  Administered 2019-02-08: 12:00:00 500 [IU] via INTRAVENOUS

## 2019-02-08 MED ORDER — ONDANSETRON HCL 8 MG PO TABS: 8.0000 mg | ORAL_TABLET | Freq: Two times a day (BID) | ORAL | 1 refills | Status: DC | PRN

## 2019-02-08 MED ORDER — DEXAMETHASONE 4 MG PO TABS: ORAL_TABLET | ORAL | 1 refills | Status: DC

## 2019-02-08 MED ORDER — LORAZEPAM 0.5 MG PO TABS: 0.5000 mg | ORAL_TABLET | Freq: Four times a day (QID) | ORAL | 0 refills | Status: DC | PRN

## 2019-02-08 NOTE — Progress Notes (Signed)
Head and Neck Cancer Simulation, IMRT treatment planning, and Special treatment procedure note   Outpatient  Diagnosis:    ICD-10-CM   1. Malignant neoplasm of supraglottis ([place]) C32.1     The patient was taken to the CT simulator and identity was confirmed.  All relevant records and images related to the planned course of therapy were reviewed.  The patient freely provided informed written consent to proceed with treatment after reviewing the details related to the planned course of therapy. The consent form was witnessed and verified by the simulation staff.    The patient was laid in the supine position on the table. An Aquaplast head and shoulder mask was custom fitted to the patient's anatomy. High-resolution CT axial imaging was obtained of the head and neck with contrast. I verified that the quality of the imaging is good for treatment planning. 1 Medically Necessary Treatment Device was fabricated and supervised by me: Aquaplast mask.  Treatment planning note I plan to treat the patient with IMRT. I plan to treat the patient's tumor and bilateral neck nodes. I plan to treat to a total dose of 70 Gray in 35  fractions. Dose calculation was ordered from dosimetry.  IMRT planning Note  IMRT is medically necessary and an important modality to deliver adequate dose to the patient's at risk tissues while sparing the patient's normal structures, including the: esophagus, parotid tissue, mandible, brain stem, spinal cord, oral cavity, brachial plexus.  This justifies the use of IMRT in the patient's treatment.   Special Treatment Procedure Note:  The patient will be receiving chemotherapy concurrently. Chemotherapy heightens the risk of side effects. I have considered this during the patient's treatment planning process and will monitor the patient accordingly for side effects on a weekly basis. Concurrent chemotherapy increases the complexity of this patient's treatment and therefore this  constitutes a special treatment procedure.  -----------------------------------  Eppie Gibson, MD

## 2019-02-08 NOTE — Progress Notes (Signed)
[place] OFFICE PROGRESS NOTE  Patient Care Team: Philmore Pali, NP as PCP - General (Nurse Practitioner)  HEME/ONC OVERVIEW: 1.  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 Suspicious <1cm bilateral Level II cervical LN's.   Transferred to [facility] for airway compromise and taken to OR for tracheostomy, bx of the supraglottic lesion did not show any malignancy  Bx of the left cervical LN was non-diagnostic -12/2018: repeat DL with bx of the supraglottic mass showed well-differentiated invasive squamous cell carcinoma  -01/2019: PET showed locally advanced FDG-avid laryngeal neoplasm with bilateral cervical LN involvement as well as mildly FDG-avid mediastinal LN's, concerning for metastatic disease; EUS w/ bx of the mediastinal LN negative for malignancy -Mid-01/2019 - present: definitive chemoradiation with weekly cisplatin   2. 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 The intent is for cure. -The plan is for cisplatin '40mg'$ /m2 weekly x 7 weeks. -Some of the short term side-effects included, though not limited to, including weight loss, life threatening  infections, risk of allergic reactions, need for transfusions of blood products, nausea, vomiting, change in bowel habits, loss of hair, admission to hospital for various reasons, and risks of death.  -Long term side-effects are also discussed including risks of infertility, permanent damage to nerve function, hearing loss, chronic fatigue, kidney damage with possibility needing hemodialysis, and rare secondary malignancy including bone marrow disorders. -The patient is aware that the response rates discussed earlier is not guaranteed.  After a long discussion, patient made an informed decision to proceed with the prescribed plan of care and went ahead to sign the consent form today.  -Labs adequate, proceed with Cycle 1 of weekly cisplatin on 02/11/2019  -ENT appt scheduled with Dr. Constance Holster for the management of tracheostomy  -I have sent anti-emetics to the patient's pharmacy, including Zofran, Compazine, Ativan and dexamethasone  Abdominal pain -Secondary to recent PEG tube placement -Patient currently has liquid Hycet that was prescribed by IR -Pain relatively well controlled -Patient understands that she can call if she runs out of the medication, and we can provide additional pain medication as needed  No orders of the defined types were placed in this encounter.  All questions were answered. The patient knows to call the clinic with any problems, questions or concerns. No barriers to learning was detected.  A total of more than 40 minutes were spent face-to-face with the patient during this encounter and over half of that time was spent on counseling and coordination of care as outlined above.   Return in 1 week for labs, port flush and clinic appt prior to Cycle 2 of chemotherapy.   Tish Men, MD 02/10/2019 10:46 AM  CHIEF COMPLAINT: "My belly is a little sore"  INTERVAL HISTORY: Ms. Apple returns to clinic for follow-up of squamous cell carcinoma of the supraglottic larynx on  definitive chemoradiation.  Patient reports that she tolerated PEG placement relatively well, and has some pain around the feeding tube site, mild to moderate, intermittent, for which she was prescribed Hycet, for which she has been taking with adequate control of the pain.  She is otherwise eating and drinking without any limitation.  She denies any other complaint today.  SUMMARY OF ONCOLOGIC HISTORY:   Malignant neoplasm of supraglottis ([place])   12/02/2018 Imaging    CT neck: IMPRESSION: 1. Supraglottic laryngeal carcinoma extending across the anterior commissure and invading the prelaryngeal fat, left aryepiglottic fold and left aspect of the epiglottis. 2. Severe narrowing of the laryngeal airway. 3. Bilateral subcentimeter level 2A and 2A cervical lymph nodes.  Critical Value/emergent results were called by telephone at the time of interpretation on 12/02/2018 at 5:34 pm to Dr. Collier Salina, who verbally acknowledged these results.    12/02/2018 Imaging    CT  Chest:  IMPRESSION: No evidence of metastatic disease in the chest.    12/03/2018 Pathology Results    (Care Everywhere; [facility]) A: Larynx, supraglottis, biopsy - Atypical squamous proliferation with at least low grade dysplasia and abundant keratinization - No definite high grade dysplasia or invasive carcinoma represented (see comment)    12/18/2018 Pathology Results    (Care Everywhere; [facility]) A:  Neck, left, fine needle aspiration - Rare single atypical cells, no cells diagnostic of malignancy - Lymph node material present  B:  Neck, right, fine needle aspiration - Non-diagnostic specimen - No lymph node material identified - Mixed inflammation and blood    12/31/2018 Pathology Results    (Care Everywhere; [facility])  A: Larynx, left supraglottic mass, biopsy - Atypical squamous proliferation with abundant keratinization, suspicious for well-differentiated invasive squamous cell carcinoma  B:  Larynx, left supraglottic mass, biopsy - Well-differentiated invasive squamous cell carcinoma with abundant keratinization (see comment)    01/18/2019 Initial Diagnosis    Laryngeal cancer ([place])    01/26/2019 Imaging    PET: IMPRESSION: 1. Locally advanced hypermetabolic laryngeal neoplasm centered in the left supraglottic space with bilateral glottic and epiglottic involvement and subglottic extension on the left. 2. Hypermetabolic bilateral level 3 neck nodal metastases. 3. Hypermetabolic high mediastinal nodal metastases between the trachea and upper thoracic esophagus. 4. Otherwise no distant hypermetabolic metastatic disease. 5. Nodular 7 mm focus at the left lung base, below PET resolution, recommend attention on follow-up chest CT in 3 months. 6. Chronic findings include: Aortic Atherosclerosis (ICD10-I70.0). Cholelithiasis.    02/08/2019 Cancer Staging    Staging form: Larynx - Supraglottis, AJCC 8th Edition - Clinical stage from 02/08/2019: Stage IVA (cT3, cN2c, cM0) - Signed by Eppie Gibson, MD on 02/08/2019    02/11/2019 -  Chemotherapy    The patient had palonosetron (ALOXI) injection 0.25 mg, 0.25 mg, Intravenous,  Once, 0 of 7 cycles CISplatin (PLATINOL) 72 mg in sodium chloride 0.9 % 250 mL chemo infusion, 40 mg/m2 = 72 mg, Intravenous,  Once, 0 of 7 cycles fosaprepitant (EMEND) 150 mg, dexamethasone (DECADRON) 12 mg in sodium chloride 0.9 % 145 mL IVPB, , Intravenous,  Once, 0 of 7 cycles  for chemotherapy treatment.      REVIEW OF SYSTEMS:   Constitutional: ( - ) fevers, ( - )  chills , ( - ) night sweats Eyes: ( - ) blurriness of vision, ( - ) double vision, ( - )  watery eyes Ears, nose, mouth, throat, and face: ( - ) mucositis, ( - ) sore throat Respiratory: ( - ) cough, ( - ) dyspnea, ( - ) wheezes Cardiovascular: ( - ) palpitation, ( - ) chest discomfort, ( - ) lower extremity swelling Gastrointestinal:  ( - ) nausea, ( - ) heartburn, ( - ) change in bowel  habits Skin: ( - ) abnormal skin rashes Lymphatics: ( - ) new lymphadenopathy, ( - ) easy bruising Neurological: ( - ) numbness, ( - ) tingling, ( - ) new weaknesses Behavioral/Psych: ( - ) mood change, ( - ) new changes  All other systems were reviewed with the patient and are negative.  I have reviewed the past medical history, past surgical history, social history and family history with the patient and they are unchanged from previous note.  ALLERGIES:  has No Known Allergies.  MEDICATIONS:  Current Outpatient Medications  Medication Sig Dispense Refill  . chlorhexidine (PERIDEX) 0.12 % solution Rinse with 15 mls twice daily for 30 seconds. Use after breakfast and at bedtime. Spit out excess. Do not swallow. (Patient taking differently: Use as directed 15 mLs in the mouth or throat 2 (two) times daily. Rinse with 15 mls twice daily for 30 seconds. Use after breakfast and at bedtime. Spit out excess. Do not swallow.) 480 mL prn  . docusate sodium (COLACE) 100 MG capsule Take 100 mg by mouth 2 (two) times daily.    Marland Kitchen HYDROcodone-acetaminophen (HYCET) 7.5-325 mg/15 ml solution Take 15 mLs by mouth 4 (four) times daily as needed for moderate pain. 120 mL 0  . ibuprofen (ADVIL) 800 MG tablet Take 400-800 mg by mouth 3 (three) times daily as needed (knee pain).     Marland Kitchen levothyroxine (SYNTHROID) 150 MCG tablet Take 150 mcg by mouth every other day. Alternating with 175 mcg dose    . levothyroxine (SYNTHROID) 175 MCG tablet Take 175 mcg by mouth every other day. Alternating with 150 mcg dose    . lidocaine-prilocaine (EMLA) cream Apply to affected area once 30 g 3  . sodium fluoride (PREVIDENT 5000 PLUS) 1.1 % CREA dental cream Apply cream to tooth brush. Brush teeth for 2 minutes. Spit out excess. DO NOT rinse afterwards. Repeat nightly. (Patient taking differently: Place 1 application onto teeth 2 (two) times a day. ) 1 Tube prn  . dexamethasone (DECADRON) 4 MG tablet Take 2 tablets by mouth once a  day on the day after chemotherapy and then take 2 tablets two times a day for 2 days. Take with food. (Patient not taking: Reported on 02/10/2019) 30 tablet 1  . LORazepam (ATIVAN) 0.5 MG tablet Take 1 tablet (0.5 mg total) by mouth every 6 (six) hours as needed (Nausea or vomiting). (Patient not taking: Reported on 02/10/2019) 30 tablet 0  . ondansetron (ZOFRAN) 8 MG tablet Take 1 tablet (8 mg total) by mouth 2 (two) times daily as needed. Start on the third day after chemotherapy. (Patient not taking: Reported on 02/10/2019) 30 tablet 1  . prochlorperazine (COMPAZINE) 10 MG tablet Take 1 tablet (10 mg total) by mouth every 6 (six) hours as needed (Nausea or vomiting). (Patient not taking: Reported on 02/10/2019) 30 tablet 1   No current facility-administered medications for this visit.     PHYSICAL EXAMINATION: ECOG PERFORMANCE STATUS: 1 - Symptomatic but completely ambulatory  Today's Vitals   02/10/19 0955 02/10/19 0959  BP: 120/76   Pulse: 78   Resp: 18   Temp: 98.8  F (37.1 C)   TempSrc: Oral   SpO2: 95%   Weight: 161 lb 14.4 oz (73.4 kg)   Height: '5\' 2"'$  (1.575 m)   PainSc:  2    Body mass index is 29.61 kg/m.  Filed Weights   02/10/19 0955  Weight: 161 lb 14.4 oz (73.4 kg)    GENERAL: alert, no distress and comfortable SKIN: skin color, texture, turgor are normal, no rashes or significant lesions EYES: conjunctiva are pink and non-injected, sclera clear OROPHARYNX: no exudate, no erythema; lips, buccal mucosa, and tongue normal  NECK: supple, non-tender, tracheostomy in place  LUNGS: clear to auscultation with normal breathing effort HEART: regular rate & rhythm and no murmurs and no lower extremity edema ABDOMEN: soft, non-tender, non-distended, normal bowel sounds Musculoskeletal: no cyanosis of digits and no clubbing  PSYCH: alert & oriented x 3 NEURO: no focal motor/sensory deficits  LABORATORY DATA:  I have reviewed the data as listed    Component Value Date/Time    NA 139 02/09/2019 1023   K 3.8 02/09/2019 1023   CL 106 02/09/2019 1023   CO2 24 02/09/2019 1023   GLUCOSE 92 02/09/2019 1023   BUN 18 02/09/2019 1023   CREATININE 0.63 02/09/2019 1023   CREATININE 0.78 01/20/2019 0904   CALCIUM 9.3 02/09/2019 1023   PROT 7.2 02/09/2019 1023   ALBUMIN 3.7 02/09/2019 1023   AST 15 02/09/2019 1023   AST 16 01/20/2019 0904   ALT 17 02/09/2019 1023   ALT 23 01/20/2019 0904   ALKPHOS 91 02/09/2019 1023   BILITOT 0.5 02/09/2019 1023   BILITOT 0.3 01/20/2019 0904   GFRNONAA >60 02/09/2019 1023   GFRNONAA >60 01/20/2019 0904   GFRAA >60 02/09/2019 1023   GFRAA >60 01/20/2019 0904    No results found for: SPEP, UPEP  Lab Results  Component Value Date   WBC 9.5 02/10/2019   NEUTROABS 5.7 02/10/2019   HGB 12.1 02/10/2019   HCT 38.4 02/10/2019   MCV 82.6 02/10/2019   PLT 323 02/10/2019      Chemistry      Component Value Date/Time   NA 139 02/09/2019 1023   K 3.8 02/09/2019 1023   CL 106 02/09/2019 1023   CO2 24 02/09/2019 1023   BUN 18 02/09/2019 1023   CREATININE 0.63 02/09/2019 1023   CREATININE 0.78 01/20/2019 0904      Component Value Date/Time   CALCIUM 9.3 02/09/2019 1023   ALKPHOS 91 02/09/2019 1023   AST 15 02/09/2019 1023   AST 16 01/20/2019 0904   ALT 17 02/09/2019 1023   ALT 23 01/20/2019 0904   BILITOT 0.5 02/09/2019 1023   BILITOT 0.3 01/20/2019 0904       RADIOGRAPHIC STUDIES: I have personally reviewed the radiological images as listed below and agreed with the findings in the report. Ir Gastrostomy Tube Mod Sed  Result Date: 02/09/2019 CLINICAL DATA:  Supraglottic carcinoma, needs enteral feeding support EXAM: PERC PLACEMENT GASTROSTOMY FLUOROSCOPY TIME:  1.3 minute; 450  uGym2 DAP TECHNIQUE: The procedure, risks, benefits, and alternatives were explained to the patient. Questions regarding the procedure were encouraged and answered. The patient understands and consents to the procedure. As antibiotic  prophylaxis, cefazolin 2 g was ordered pre-procedure and administered intravenously within one hour of incision. An appropriate percutaneous approach was confirmed on recent PET-CT. A 5 French angiographic catheter was placed as orogastric tube. The upper abdomen was prepped with Betadine, draped in usual sterile fashion, and infiltrated locally with 1% lidocaine.  Intravenous Fentanyl 114mg and Versed 392mwere administered as conscious sedation during continuous monitoring of the patient's level of consciousness and physiological / cardiorespiratory status by the radiology RN, with a total moderate sedation time of 13 minutes. 1 mg glucagon given IV to facilitate gastric distention.Stomach was insufflated using air through the orogastric tube. An 1835rench sheath needle was advanced percutaneously into the gastric lumen under fluoroscopy. Gas could be aspirated and a small contrast injection confirmed intraluminal spread. The sheath was exchanged over a guidewire for a 9 Fr[place]ascular sheath, through which the snare device was advanced and used to snare a guidewire passed through the orogastric tube. This was withdrawn, and the snare attached to the 20 French pull-through gastrostomy tube, which was advanced antegrade, positioned with the internal bumper securing the anterior gastric wall to the anterior abdominal wall. Small contrast injection confirms appropriate positioning. The external bumper was applied and the catheter was flushed. COMPLICATIONS: COMPLICATIONS none IMPRESSION: 1. Technically successful 20 French pull-through gastrostomy placement under fluoroscopy. Electronically Signed   By: D Lucrezia Europe.D.   On: 02/09/2019 13:39   Nm Pet Image Initial (pi) Skull Base To Thigh  Result Date: 01/26/2019 CLINICAL DATA:  Initial treatment strategy for supraglottic laryngeal cancer. EXAM: NUCLEAR MEDICINE PET SKULL BASE TO THIGH TECHNIQUE: 8.1 mCi F-18 FDG was injected intravenously. Full-ring PET imaging  was performed from the skull base to thigh after the radiotracer. CT data was obtained and used for attenuation correction and anatomic localization. Fasting blood glucose: 92 mg/dl COMPARISON:  None. FINDINGS: Mediastinal blood pool activity: SUV max 2.4 NECK: There is a locally advanced hypermetabolic laryngeal mass measuring approximately 4.4 x 4.0 cm with max SUV 15.4 centered in the left supraglottic space, with bilateral glottic and epiglottic involvement and with subglottic extension on the left. Bilateral hypermetabolic level 3 lymph nodes in the neck, for example 1.1 cm on the right with max SUV 6.3 (series 4/image 21) and 1.3 cm on the left with max SUV 5.7 (series 4/image 22). Incidental CT findings: Tracheostomy tube terminates in tracheal lumen at the level of the thoracic inlet. CHEST: There are mildly enlarged hypermetabolic high mediastinal lymph node between the trachea and upper thoracic esophagus, measuring 1.1 cm on the right with max SUV 3.9 (series 4/image 34) and 1.0 cm on the left with max SUV 3.8 (series 4/image 36). No enlarged or hypermetabolic axillary or hilar lymph nodes. No hypermetabolic pulmonary findings. Incidental CT findings: Atherosclerotic nonaneurysmal thoracic aorta. Nodular 7 mm peripheral basilar left lower lobe focus (series 8/image 41), below PET resolution. No additional significant pulmonary nodules. ABDOMEN/PELVIS: No abnormal hypermetabolic activity within the liver, pancreas, adrenal glands, or spleen. No hypermetabolic lymph nodes in the abdomen or pelvis. Nonspecific hypermetabolism at anorectal junction with max SUV 4.9, without discrete mass on the noncontrast CT images. Incidental CT findings: Cholelithiasis. Atherosclerotic nonaneurysmal abdominal aorta. SKELETON: No focal hypermetabolic activity to suggest skeletal metastasis. Incidental CT findings: Marked degenerative changes throughout the spine. IMPRESSION: 1. Locally advanced hypermetabolic laryngeal  neoplasm centered in the left supraglottic space with bilateral glottic and epiglottic involvement and subglottic extension on the left. 2. Hypermetabolic bilateral level 3 neck nodal metastases. 3. Hypermetabolic high mediastinal nodal metastases between the trachea and upper thoracic esophagus. 4. Otherwise no distant hypermetabolic metastatic disease. 5. Nodular 7 mm focus at the left lung base, below PET resolution, recommend attention on follow-up chest CT in 3 months. 6. Chronic findings include: Aortic Atherosclerosis (ICD10-I70.0). Cholelithiasis. Electronically Signed   By:  Ilona Sorrel M.D.   On: 01/26/2019 10:28   Ir Imaging Guided Port Insertion  Result Date: 02/02/2019 INDICATION: History of metastatic head neck cancer, in need of durable intravenous access for chemotherapy administration. EXAM: IMPLANTED PORT A CATH PLACEMENT WITH ULTRASOUND AND FLUOROSCOPIC GUIDANCE COMPARISON:  PET-CT - 01/26/2019 MEDICATIONS: Ancef 2 gm IV; The antibiotic was administered within an appropriate time interval prior to skin puncture. ANESTHESIA/SEDATION: Moderate (conscious) sedation was employed during this procedure. A total of Versed 2 mg and Fentanyl 100 mcg was administered intravenously. Moderate Sedation Time: 23 minutes. The patient's level of consciousness and vital signs were monitored continuously by radiology nursing throughout the procedure under my direct supervision. CONTRAST:  None FLUOROSCOPY TIME:  24 seconds (6 mGy) COMPLICATIONS: None immediate. PROCEDURE: The procedure, risks, benefits, and alternatives were explained to the patient. Questions regarding the procedure were encouraged and answered. The patient understands and consents to the procedure. The right neck and chest were prepped with chlorhexidine in a sterile fashion, and a sterile drape was applied covering the operative field. Maximum barrier sterile technique with sterile gowns and gloves were used for the procedure. A timeout was  performed prior to the initiation of the procedure. Local anesthesia was provided with 1% lidocaine with epinephrine. After creating a small venotomy incision, a micropuncture kit was utilized to access the internal jugular vein. Real-time ultrasound guidance was utilized for vascular access including the acquisition of a permanent ultrasound image documenting patency of the accessed vessel. The microwire was utilized to measure appropriate catheter length. A subcutaneous port pocket was then created along the upper chest wall utilizing a combination of sharp and blunt dissection. The pocket was irrigated with sterile saline. A single lumen slim power injectable port was chosen for placement. The 8 Fr catheter was tunneled from the port pocket site to the venotomy incision. The port was placed in the pocket. The external catheter was trimmed to appropriate length. At the venotomy, an 8 Fr peel-away sheath was placed over a guidewire under fluoroscopic guidance. The catheter was then placed through the sheath and the sheath was removed. Final catheter positioning was confirmed and documented with a fluoroscopic spot radiograph. The port was accessed with a Huber needle, aspirated and flushed with heparinized saline. The venotomy site was closed with an interrupted 4-0 Vicryl suture. The port pocket incision was closed with interrupted 2-0 Vicryl suture and the skin was opposed with a running subcuticular 4-0 Vicryl suture. Dermabond and Steri-strips were applied to both incisions. Dressings were placed. The patient tolerated the procedure well without immediate post procedural complication. FINDINGS: After catheter placement, the tip lies within the superior cavoatrial junction. The catheter aspirates and flushes normally and is ready for immediate use. IMPRESSION: Successful placement of a right internal jugular approach power injectable Port-A-Cath. The catheter is ready for immediate use. Electronically Signed    By: [name] M.D.   On: 02/02/2019 16:52

## 2019-02-09 ENCOUNTER — Ambulatory Visit: Payer: Medicare HMO

## 2019-02-09 ENCOUNTER — Ambulatory Visit (HOSPITAL_COMMUNITY)
Admission: RE | Admit: 2019-02-09 | Discharge: 2019-02-09 | Disposition: A | Discharge status: Home / Self Care | Payer: Medicare HMO | Source: Ambulatory Visit | Attending: Hematology | Admitting: Hematology

## 2019-02-09 ENCOUNTER — Other Ambulatory Visit: Payer: Self-pay

## 2019-02-09 ENCOUNTER — Encounter (HOSPITAL_COMMUNITY): Payer: Self-pay

## 2019-02-09 DIAGNOSIS — C321 Malignant neoplasm of supraglottis: Secondary | ICD-10-CM

## 2019-02-09 DIAGNOSIS — Z79890 Hormone replacement therapy (postmenopausal): Secondary | ICD-10-CM | POA: Diagnosis not present

## 2019-02-09 DIAGNOSIS — E079 Disorder of thyroid, unspecified: Secondary | ICD-10-CM | POA: Insufficient documentation

## 2019-02-09 DIAGNOSIS — C329 Malignant neoplasm of larynx, unspecified: Secondary | ICD-10-CM | POA: Insufficient documentation

## 2019-02-09 DIAGNOSIS — Z9221 Personal history of antineoplastic chemotherapy: Secondary | ICD-10-CM | POA: Diagnosis not present

## 2019-02-09 DIAGNOSIS — Z930 Tracheostomy status: Secondary | ICD-10-CM | POA: Diagnosis not present

## 2019-02-09 HISTORY — PX: IR GASTROSTOMY TUBE MOD SED: IMG625

## 2019-02-09 LAB — CBC WITH DIFFERENTIAL/PLATELET
Abs Immature Granulocytes: 0.03 10*3/uL (ref 0.00–0.07)
Basophils Absolute: 0 10*3/uL (ref 0.0–0.1)
Basophils Relative: 0 %
Eosinophils Absolute: 0.3 10*3/uL (ref 0.0–0.5)
Eosinophils Relative: 4 %
HCT: 37.2 % (ref 36.0–46.0)
Hemoglobin: 11.8 g/dL — ABNORMAL LOW (ref 12.0–15.0)
Immature Granulocytes: 0 %
Lymphocytes Relative: 29 %
Lymphs Abs: 2.4 10*3/uL (ref 0.7–4.0)
MCH: 26.8 pg (ref 26.0–34.0)
MCHC: 31.7 g/dL (ref 30.0–36.0)
MCV: 84.5 fL (ref 80.0–100.0)
Monocytes Absolute: 0.7 10*3/uL (ref 0.1–1.0)
Monocytes Relative: 9 %
Neutro Abs: 4.6 10*3/uL (ref 1.7–7.7)
Neutrophils Relative %: 58 %
Platelets: 348 10*3/uL (ref 150–400)
RBC: 4.4 MIL/uL (ref 3.87–5.11)
RDW: 14.5 % (ref 11.5–15.5)
WBC: 8.1 10*3/uL (ref 4.0–10.5)
nRBC: 0 % (ref 0.0–0.2)

## 2019-02-09 LAB — COMPREHENSIVE METABOLIC PANEL
ALT: 17 U/L (ref 0–44)
AST: 15 U/L (ref 15–41)
Albumin: 3.7 g/dL (ref 3.5–5.0)
Alkaline Phosphatase: 91 U/L (ref 38–126)
Anion gap: 9 (ref 5–15)
BUN: 18 mg/dL (ref 8–23)
CO2: 24 mmol/L (ref 22–32)
Calcium: 9.3 mg/dL (ref 8.9–10.3)
Chloride: 106 mmol/L (ref 98–111)
Creatinine, Ser: 0.63 mg/dL (ref 0.44–1.00)
GFR calc Af Amer: >60 mL/min (ref >60)
GFR calc non Af Amer: >60 mL/min (ref >60)
Glucose, Bld: 92 mg/dL (ref 70–99)
Potassium: 3.8 mmol/L (ref 3.5–5.1)
Sodium: 139 mmol/L (ref 135–145)
Total Bilirubin: 0.5 mg/dL (ref 0.3–1.2)
Total Protein: 7.2 g/dL (ref 6.5–8.1)

## 2019-02-09 LAB — PROTIME-INR
INR: 1 (ref 0.8–1.2)
Prothrombin Time: 13.2 seconds (ref 11.4–15.2)

## 2019-02-09 IMAGING — XA PERC PLACEMENT GASTROSTOMY
6 series · 16 of 21 positions shown · non-contrast
Comparison: none

CLINICAL DATA: Supraglottic carcinoma, needs enteral feeding
support

EXAM:
PERC PLACEMENT GASTROSTOMY
FLUOROSCOPY TIME:  1.3 minute; 450  [R2] DAP
TECHNIQUE: The procedure, risks, benefits, and alternatives were explained to
the patient. Questions regarding the procedure were encouraged and
answered. The patient understands and consents to the procedure.

[Series 1: fl - angio · 3 of 48 frames shown (1 of 5)]
[frame 8/48]
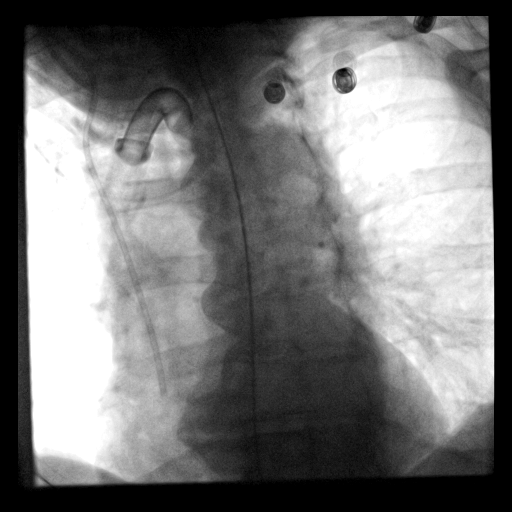
[frame 25/48]
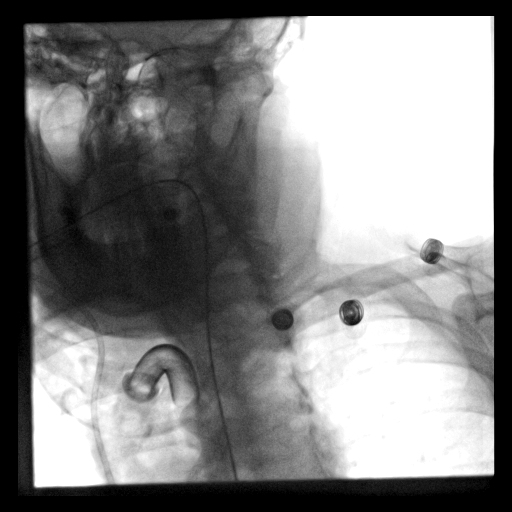
[frame 47/48]
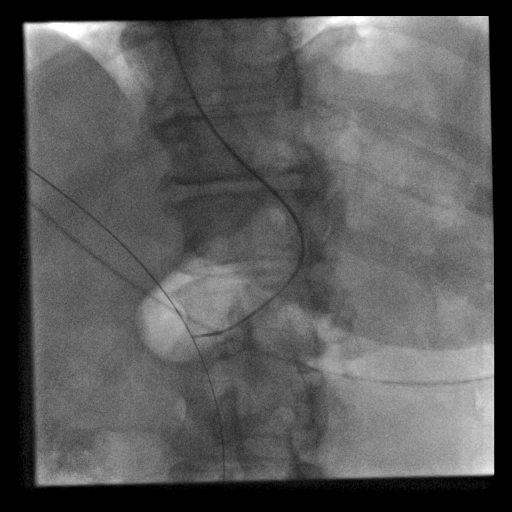

[Series 2: fl - angio · 3 of 23 frames shown (2 of 5)]
[frame 4/23]
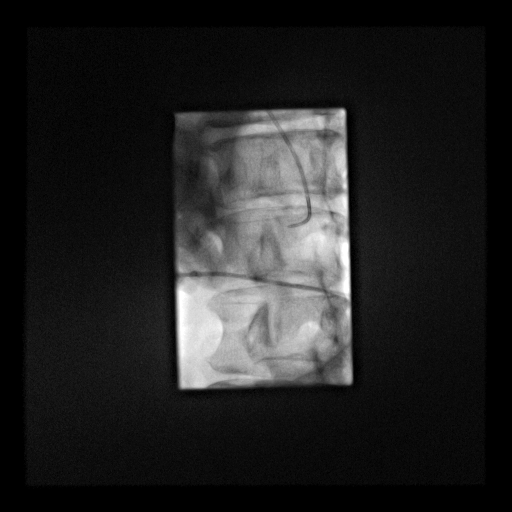
[frame 12/23]
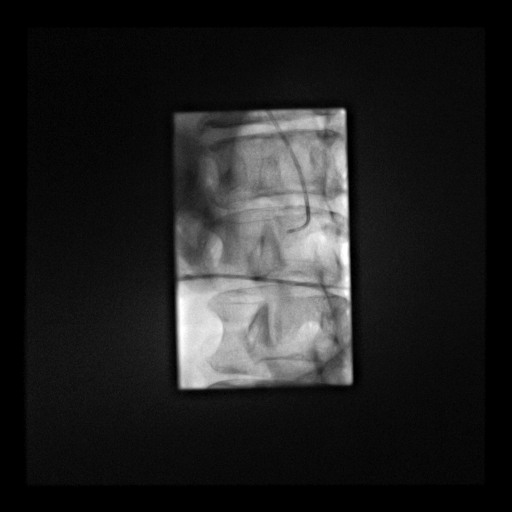
[frame 20/23]
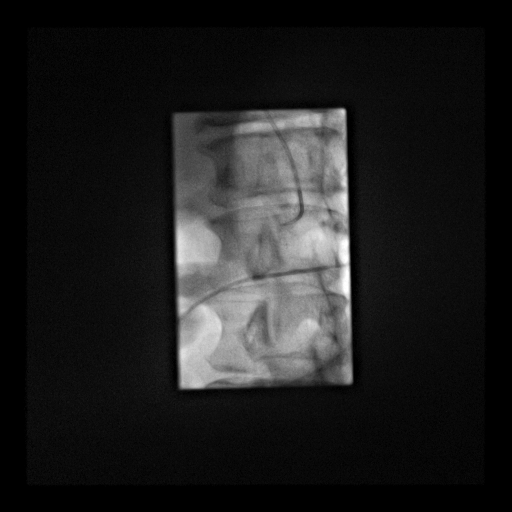

[Series 3: fl - angio · 3 of 42 frames shown (3 of 5)]
[frame 1/42]
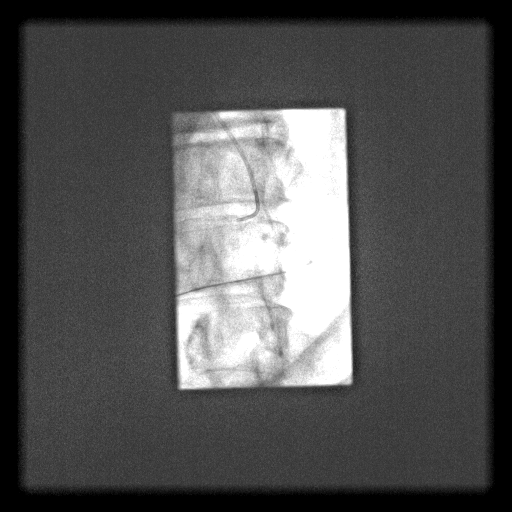
[frame 7/42]
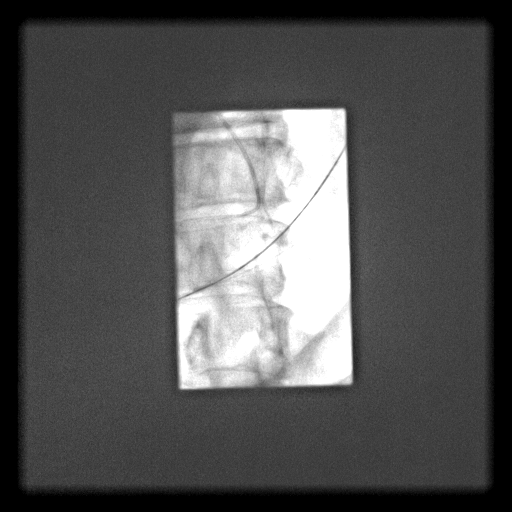
[frame 36/42]
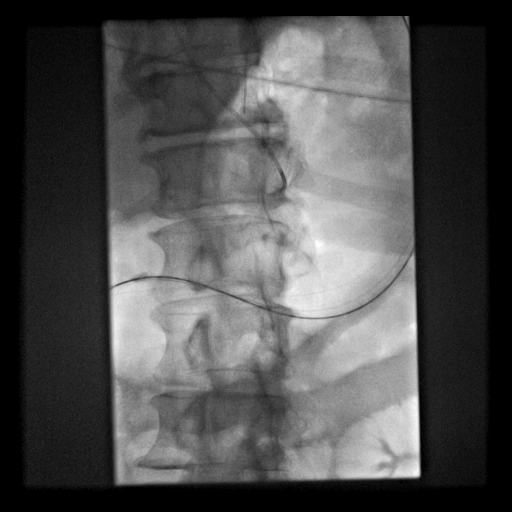

[Series 4: fl - angio · 3 of 115 frames shown (4 of 5)]
[frame 18/115]
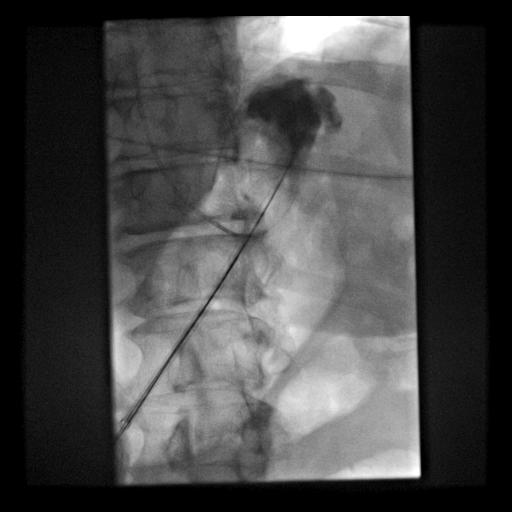
[frame 42/115]
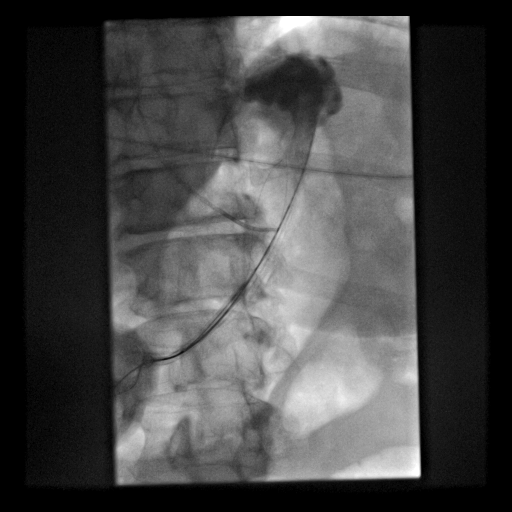
[frame 98/115]
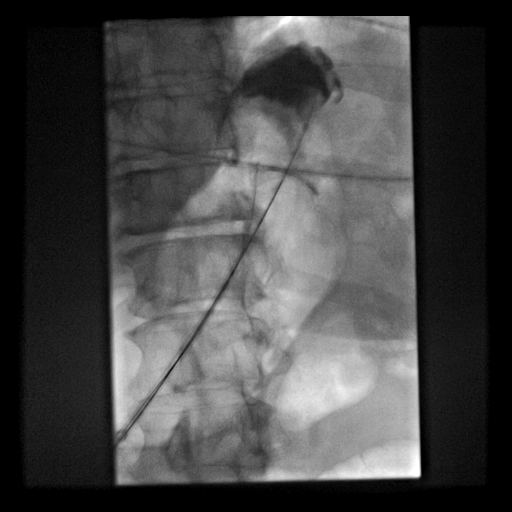

[Series 5: fl - angio · 3 of 15 frames shown (5 of 5)]
[frame 1/15]
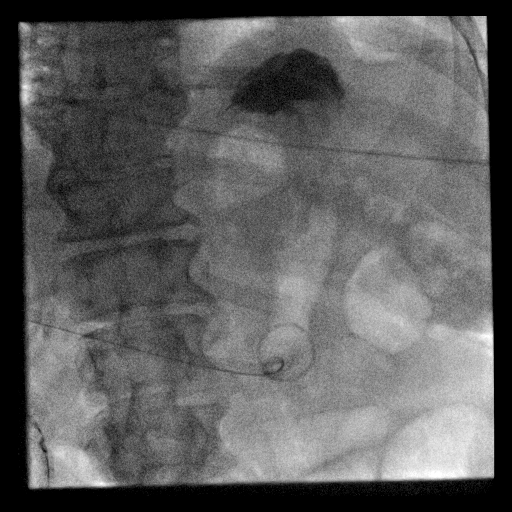
[frame 3/15]
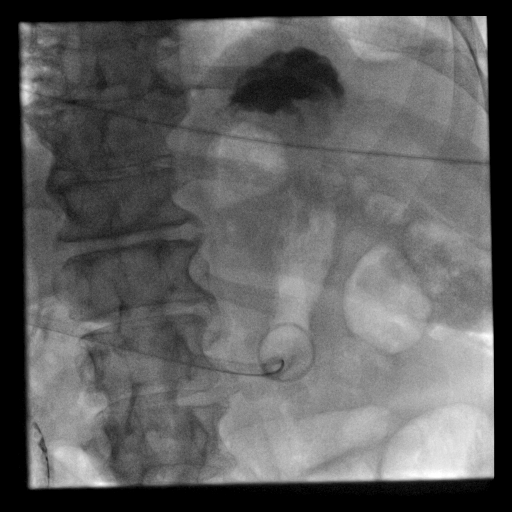
[frame 13/15]
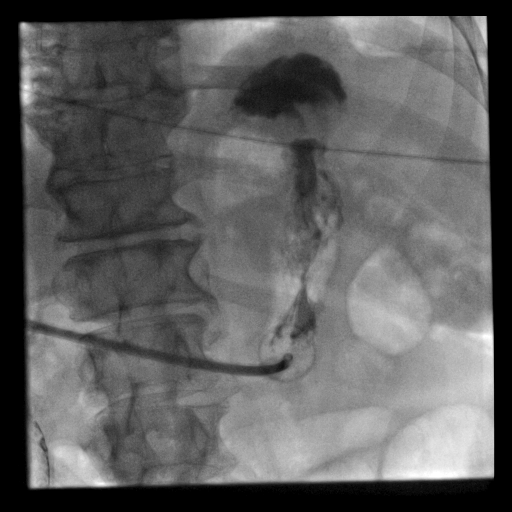

[Series 300: tube placements · 1 of 1 slices shown]
[im 1/1]
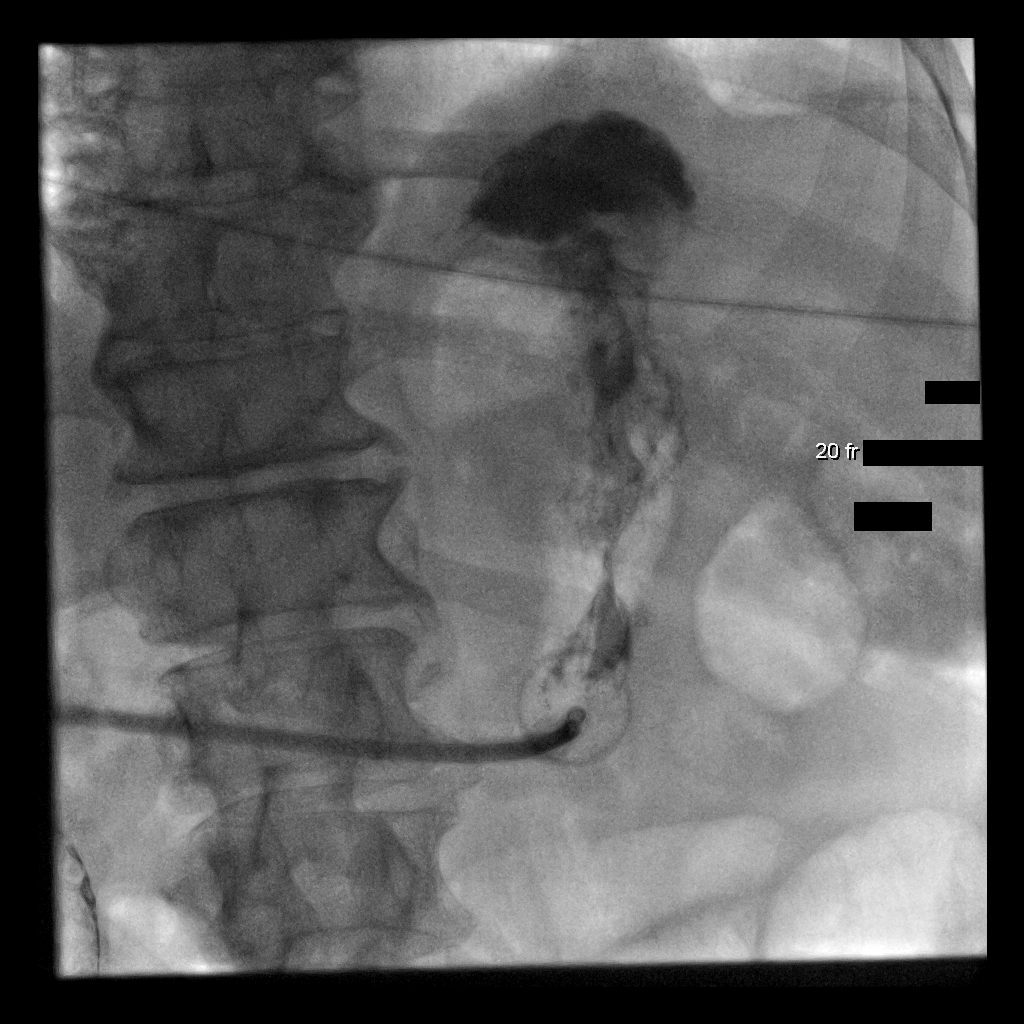

[16 of 21 positions shown; findings below may reference images not displayed]

As antibiotic prophylaxis, cefazolin 2 g was ordered pre-procedure
and administered intravenously within one hour of incision.

An appropriate percutaneous approach was confirmed on recent PET-CT.

A 5 French angiographic catheter was placed as orogastric tube. The
upper abdomen was prepped with Betadine, draped in usual sterile
fashion, and infiltrated locally with 1% lidocaine.

Intravenous Fentanyl [R2] and Versed 3mg were administered as
conscious sedation during continuous monitoring of the patient's
level of consciousness and physiological / cardiorespiratory status
by the radiology RN, with a total moderate sedation time of 13
minutes. 1 mg glucagon given IV to facilitate gastric
distention.Stomach was insufflated using air through the orogastric
tube. An 18 French sheath needle was advanced percutaneously into
the gastric lumen under fluoroscopy. Gas could be aspirated and a
small contrast injection confirmed intraluminal spread. The sheath
was exchanged over a guidewire for a 9 French vascular sheath,
through which the snare device was advanced and used to snare a
guidewire passed through the orogastric tube. This was withdrawn,
and the snare attached to the 20 French pull-through gastrostomy
tube, which was advanced antegrade, positioned with the internal
bumper securing the anterior gastric wall to the anterior abdominal
wall. Small contrast injection confirms appropriate positioning. The
external bumper was applied and the catheter was flushed.

COMPLICATIONS:
COMPLICATIONS
none
IMPRESSION: 1. Technically successful 20 French pull-through gastrostomy
placement under fluoroscopy.

## 2019-02-09 MED ORDER — HYDROMORPHONE HCL 1 MG/ML IJ SOLN: 1.0000 mg | INTRAMUSCULAR | Status: DC | PRN

## 2019-02-09 MED ORDER — LIDOCAINE-EPINEPHRINE 1 %-1:100000 IJ SOLN
INTRAMUSCULAR | Status: AC
  Filled 2019-02-09: qty 1

## 2019-02-09 MED ORDER — FENTANYL CITRATE (PF) 100 MCG/2ML IJ SOLN
INTRAMUSCULAR | Status: AC
  Filled 2019-02-09: qty 2

## 2019-02-09 MED ORDER — FENTANYL CITRATE (PF) 100 MCG/2ML IJ SOLN
INTRAMUSCULAR | Status: AC | PRN
  Administered 2019-02-09 (×3): 50 ug via INTRAVENOUS

## 2019-02-09 MED ORDER — HYDROCODONE-HOMATROPINE 5-1.5 MG/5ML PO SYRP
5.0000 mL | ORAL_SOLUTION | ORAL | Status: DC | PRN
  Filled 2019-02-09: qty 5

## 2019-02-09 MED ORDER — SODIUM CHLORIDE 0.9 % IV SOLN
INTRAVENOUS | Status: DC
  Administered 2019-02-09: 10:00:00 via INTRAVENOUS

## 2019-02-09 MED ORDER — LIDOCAINE-EPINEPHRINE 1 %-1:100000 IJ SOLN
INTRAMUSCULAR | Status: AC | PRN
  Administered 2019-02-09: 10 mL

## 2019-02-09 MED ORDER — MIDAZOLAM HCL 2 MG/2ML IJ SOLN
INTRAMUSCULAR | Status: AC
  Filled 2019-02-09: qty 4

## 2019-02-09 MED ORDER — CEFAZOLIN SODIUM-DEXTROSE 2-4 GM/100ML-% IV SOLN: 2.0000 g | INTRAVENOUS | Status: DC

## 2019-02-09 MED ORDER — HYDROCODONE-ACETAMINOPHEN 7.5-325 MG/15ML PO SOLN: 15.0000 mL | Freq: Four times a day (QID) | ORAL | 0 refills | Status: DC | PRN

## 2019-02-09 MED ORDER — HEPARIN SOD (PORK) LOCK FLUSH 100 UNIT/ML IV SOLN
500.0000 [IU] | Freq: Once | INTRAVENOUS | Status: AC
  Administered 2019-02-09: 500 [IU] via INTRAVENOUS
  Filled 2019-02-09: qty 5

## 2019-02-09 MED ORDER — HYDROCODONE-ACETAMINOPHEN 7.5-325 MG/15ML PO SOLN
15.0000 mL | Freq: Once | ORAL | Status: AC
  Administered 2019-02-09: 15 mL via ORAL
  Filled 2019-02-09: qty 15

## 2019-02-09 MED ORDER — HYDROCODONE-ACETAMINOPHEN 5-325 MG PO TABS: 1.0000 | ORAL_TABLET | ORAL | Status: DC | PRN

## 2019-02-09 MED ORDER — GLUCAGON HCL (RDNA) 1 MG IJ SOLR
INTRAMUSCULAR | Status: AC | PRN
  Administered 2019-02-09: 1 mg via INTRAVENOUS

## 2019-02-09 MED ORDER — CEFAZOLIN SODIUM-DEXTROSE 2-4 GM/100ML-% IV SOLN
INTRAVENOUS | Status: AC
  Filled 2019-02-09: qty 100

## 2019-02-09 MED ORDER — GLUCAGON HCL RDNA (DIAGNOSTIC) 1 MG IJ SOLR
INTRAMUSCULAR | Status: AC
  Filled 2019-02-09: qty 1

## 2019-02-09 MED ORDER — IOHEXOL 300 MG/ML  SOLN
50.0000 mL | Freq: Once | INTRAMUSCULAR | Status: AC | PRN
  Administered 2019-02-09: 12:00:00 10 mL via ORAL

## 2019-02-09 MED ORDER — MIDAZOLAM HCL 2 MG/2ML IJ SOLN
INTRAMUSCULAR | Status: AC | PRN
  Administered 2019-02-09 (×3): 1 mg via INTRAVENOUS

## 2019-02-09 MED ORDER — LIDOCAINE HCL 1 % IJ SOLN
INTRAMUSCULAR | Status: AC
  Filled 2019-02-09: qty 20

## 2019-02-09 MED ORDER — ONDANSETRON HCL 4 MG/2ML IJ SOLN: 4.0000 mg | INTRAMUSCULAR | Status: DC | PRN

## 2019-02-09 NOTE — Procedures (Signed)
  Procedure: Gastrostomy tube placement 60f EBL:   minimal Complications:  none immediate  See full dictation in [facility].  Dillard Cannon MD Main # [phone] Pager  [phone]

## 2019-02-09 NOTE — Discharge Instructions (Signed)
PEG Tube Home Guide A PEG tube is used to put food and fluids into the stomach. Before you leave the hospital, make sure that you know:  How to care for your PEG tube.  How to care for the opening (stoma) in your belly.  How to give yourself a feeding.  How to give yourself medicines.  When to call your doctor for help. Supplies needed:  [employer], plain water  Clean washcloth  Gauze pad  Syringe How to care for a PEG tube Check your PEG tube every day. Make sure:  It is not too tight.  It is in the correct place. There is a mark on the tube that shows when the tube is in the correct place. Adjust the tube if you need to. Cleaning your stoma Clean your stoma every day. Follow these steps: 1. Wash your hands with soap and water. If soap and water are not available, use hand sanitizer. 2. Check your stoma. Let your doctor know if there is: ? Redness. ? Leaking. ? Skin irritation. 3. Wash the stoma gently with warm, soapy water. 4. Rinse the stoma with plain water. 5. Pat the stoma area dry. 6. You may place a gauze pad around the opening of your stoma.  Giving a feeding Your doctor will tell you:  How much nutrition and fluid you will need for each feeding.  How often to have a feeding.  Whether you should take medicine in the tube by itself or with a feeding. To give yourself a feeding, follow these steps: 1. Lay out all of the things that you will need. 2. Make sure that the nutritional formula is at room temperature. 3. Wash your hands with soap and water. 4. Sit up or stand up straight. You will need to stay sitting up or standing up while you give yourself a feeding. 5. Make sure the syringe plunger is pushed in. Place the tip of the syringe in clean water, and slowly pull the plunger to bring (draw up) the water into the syringe. 6. Remove the clamp and the cap from the PEG tube. 7. Push the water out of the syringe to clean (flush) the  tube. 8. If the tube is clear, draw up the formula into the syringe. Make sure to use the right amount for each feeding. Add water if you need to. 9. Slowly push the formula from the syringe through the tube. 10. After the feeding, flush the tube with water. 11. Put the clamp and the cap on the tube. 12. Stay sitting up or standing up straight for at least 30 minutes. Giving medicine To give yourself medicine, follow these steps: 1. Lay out all of the things that you will need. 2. If your medicine is in tablet form, crush it and dissolve it in water. 3. Wash your hands with soap and water. 4. Sit up or stand up straight. You will need to stay sitting up or standing up while you give yourself medicine. 5. Make sure the syringe plunger is pushed in. Place the tip of the syringe in clean water, and slowly pull the plunger to bring (draw up) the water into the syringe. 6. Remove the clamp and the cap from the PEG tube. 7. Push the water out of the syringe to clean (flush) the tube. 8. If the tube is clear, draw up the medicine into the syringe. 9. Slowly push the medicine from the syringe through the tube. 10. Flush the tube  with water. 11. Put the clamp and the cap on the tube. 12. Stay sitting up or standing up straight for at least 30 minutes. Do not take sustained release (SR) medicines through your tube. If you are not sure if your medicine is a SR medicine, ask your doctor. Contact a doctor if:  The area around your stoma is sore, irritated, or red.  You have belly pain or bloating while you are feeding, or after you feed.  You feel sick to your stomach (nausea) for a long time.  You cannot poop (constipated) or you have watery poop (diarrhea) for a long time.  You have a fever.  You have problems with your PEG tube. Get help right away if:  Your tube is blocked.  Your tube falls out.  You have pain around your tube.  You are bleeding from your tube.  Your tube is  leaking.  You choke or you have trouble breathing while you are feeding or after you feed. Summary  A PEG tube is used to put food and fluids into the stomach.  Before you leave the hospital, you will be taught how to use and care for your PEG tube.  Your doctor will give you instructions on how to give yourself feedings and medicines.  Contact your doctor if you have fever or soreness, redness, or irritation around your stoma.  Get help right away if your tube leaks, is blocked, or falls out. Also, get help right away if you have pain or bleeding around your stoma. This information is not intended to replace advice given to you by your health care provider. Make sure you discuss any questions you have with your health care provider. Document Released: 10/12/2010 Document Revised: 09/22/2017 Document Reviewed: 09/22/2017 Elsevier Interactive Patient Education  2019 [place].    Moderate Conscious Sedation, Adult, Care After These instructions provide you with information about caring for yourself after your procedure. Your health care provider may also give you more specific instructions. Your treatment has been planned according to current medical practices, but problems sometimes occur. Call your health care provider if you have any problems or questions after your procedure. What can I expect after the procedure? After your procedure, it is common:  To feel sleepy for several hours.  To feel clumsy and have poor balance for several hours.  To have poor judgment for several hours.  To vomit if you eat too soon. Follow these instructions at home: For at least 24 hours after the procedure:   Do not: ? Participate in activities where you could fall or become injured. ? Drive. ? Use heavy machinery. ? Drink alcohol. ? Take sleeping pills or medicines that cause drowsiness. ? Make important decisions or sign legal documents. ? Take care of children on your  own.  Rest. Eating and drinking  Follow the diet recommended by your health care provider.  If you vomit: ? Drink water, juice, or soup when you can drink without vomiting. ? Make sure you have little or no nausea before eating solid foods. General instructions  Have a responsible adult stay with you until you are awake and alert.  Take over-the-counter and prescription medicines only as told by your health care provider.  If you smoke, do not smoke without supervision.  Keep all follow-up visits as told by your health care provider. This is important. Contact a health care provider if:  You keep feeling nauseous or you keep vomiting.  You feel light-headed.  You develop a rash.  You have a fever. Get help right away if:  You have trouble breathing. This information is not intended to replace advice given to you by your health care provider. Make sure you discuss any questions you have with your health care provider. Document Released: 06/30/2013 Document Revised: 02/12/2016 Document Reviewed: 12/30/2015 Elsevier Interactive Patient Education  2019 [employer].

## 2019-02-09 NOTE — H&P (Signed)
Referring Physician(s): Zhao,Yan  Supervising Physician: Arne Cleveland  Patient Status:  [facility]  Chief Complaint: "I'm getting a stomach tube"   Subjective: Patient familiar to IR service from Port-A-Cath placement on 02/02/2019.  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 She is currently undergoing chemotherapy and starting radiation therapy tomorrow.  She presents today for percutaneous gastrostomy tube placement.  She is COVID negative.  She denies fever, headache, chest pain, dyspnea, abdominal/back pain, nausea, vomiting or bleeding.  She does have occasional cough and secretions from trach.   Past Medical History:  Diagnosis Date  . Cataract   . Laryngeal cancer ([place]) 11/2018  . Thyroid disease    Past Surgical History:  Procedure Laterality Date  . CATARACT EXTRACTION, BILATERAL  left 05/20/16, right 06/03/16   Cataract Extraction w/ IOL Implant; Surgeon: Sudie Grumbling, MD; Location: OR [facility]; Service: Ophthalmology   . CESAREAN SECTION    . ESOPHAGOGASTRODUODENOSCOPY (EGD) WITH PROPOFOL N/A 02/01/2019   Procedure: ESOPHAGOGASTRODUODENOSCOPY (EGD) WITH PROPOFOL;  Surgeon: Milus Banister, MD;  Location: [facility];  Service: Endoscopy;  Laterality: N/A;  . EUS N/A 02/01/2019   Procedure: UPPER ENDOSCOPIC ULTRASOUND (EUS) RADIAL-Will need Linear and Forward Viewing EUS Scope;  Surgeon: Milus Banister, MD;  Location: [facility];  Service: Endoscopy;  Laterality: N/A;  . FINE NEEDLE ASPIRATION N/A 02/01/2019   Procedure: FINE NEEDLE ASPIRATION (FNA) LINEAR;  Surgeon: Milus Banister, MD;  Location: [facility];  Service: Endoscopy;  Laterality: N/A;  . IR IMAGING GUIDED PORT INSERTION  02/02/2019  . KNEE CARTILAGE SURGERY Right 2019  . TRACHEOSTOMY  12/02/2018   [facility]      Allergies: Patient has no known allergies.  Medications: Prior to Admission medications    Medication Sig Start Date End Date Taking? Authorizing Provider  chlorhexidine (PERIDEX) 0.12 % solution Rinse with 15 mls twice daily for 30 seconds. Use after breakfast and at bedtime. Spit out excess. Do not swallow. Patient taking differently: Use as directed 15 mLs in the mouth or throat 2 (two) times daily. Rinse with 15 mls twice daily for 30 seconds. Use after breakfast and at bedtime. Spit out excess. Do not swallow. 01/25/19  Yes Lenn Cal, DDS  docusate sodium (COLACE) 100 MG capsule Take 100 mg by mouth 2 (two) times daily.   Yes [provider]  ibuprofen (ADVIL) 800 MG tablet Take 400-800 mg by mouth 3 (three) times daily as needed (knee pain).  11/19/18  Yes [provider]  levothyroxine (SYNTHROID) 150 MCG tablet Take 150 mcg by mouth every other day. Alternating with 175 mcg dose 01/08/19  Yes [provider]  lidocaine-prilocaine (EMLA) cream Apply to affected area once 02/08/19  Yes Tish Men, MD  sodium fluoride (PREVIDENT 5000 PLUS) 1.1 % CREA dental cream Apply cream to tooth brush. Brush teeth for 2 minutes. Spit out excess. DO NOT rinse afterwards. Repeat nightly. Patient taking differently: Place 1 application onto teeth 2 (two) times a day.  01/25/19  Yes Lenn Cal, DDS  dexamethasone (DECADRON) 4 MG tablet Take 2 tablets by mouth once a day on the day after chemotherapy and then take 2 tablets two times a day for 2 days. Take with food. 02/08/19   Tish Men, MD  levothyroxine (SYNTHROID) 175 MCG tablet Take 175 mcg by mouth every other day. Alternating with 150 mcg dose 01/08/19  [provider]  LORazepam (ATIVAN) 0.5 MG tablet Take 1 tablet (0.5 mg total) by mouth every 6 (six) hours as needed (Nausea or vomiting). 02/08/19   Tish Men, MD  ondansetron (ZOFRAN) 8 MG tablet Take 1 tablet (8 mg total) by mouth 2 (two) times daily as needed. Start on the third day after chemotherapy. 02/08/19   Tish Men, MD  prochlorperazine  (COMPAZINE) 10 MG tablet Take 1 tablet (10 mg total) by mouth every 6 (six) hours as needed (Nausea or vomiting). 02/08/19   Tish Men, MD     Vital Signs: Blood pressure 109/75, heart rate 75, temp 98.2, respirations 19, O2 sats 98% room air   Physical Exam awake, alert.  Trach in place.  Chest clear to auscultation bilaterally.  Heart with regular rate and rhythm.  Abdomen soft, positive bowel sounds, nontender. No significant lower extremity edema.  Imaging: No results found.  Labs:  CBC: Recent Labs    01/20/19 0904 02/02/19 1244  WBC 7.4 7.0  HGB 13.2 12.2  HCT 42.0 38.7  PLT 406* 382    COAGS: Recent Labs    02/02/19 1244  INR 1.0    BMP: Recent Labs    12/02/18 1652 01/20/19 0904  NA  --  140  K  --  4.3  CL  --  103  CO2  --  26  GLUCOSE  --  94  BUN  --  12  CALCIUM  --  10.3  CREATININE 0.70 0.78  GFRNONAA  --  >60  GFRAA  --  >60    LIVER FUNCTION TESTS: Recent Labs    01/20/19 0904  BILITOT 0.3  AST 16  ALT 23  ALKPHOS 114  PROT 8.0  ALBUMIN 3.7    Assessment and Plan: 68 y.o. 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  She is currently undergoing chemotherapy and is scheduled to start radiation therapy tomorrow.  She presents today for percutaneous gastrostomy tube placement. She is COVID negative. Risks and benefits discussed with the patient including, but not limited to the need for a barium enema during the procedure, bleeding, infection, peritonitis, or damage to adjacent structures.  All of the patient's questions were answered, patient is agreeable to proceed. Consent signed and in chart.     Electronically Signed: D. Rowe Robert, PA-C 02/09/2019, 10:23 AM   I spent a total of 20 minutes at the the patient's bedside AND on the patient's hospital floor or unit, greater than 50% of which was counseling/coordinating care for percutaneous  gastrostomy tube placement

## 2019-02-09 NOTE — Progress Notes (Signed)
Patient ID: [name] [name], female   DOB: 1951-06-30, 68 y.o.   MRN: [id number] Prescription for Hycet (7.5-325 mg)sent to patient's pharmacy for pain control post gastrostomy tube placement.

## 2019-02-10 ENCOUNTER — Encounter: Payer: Self-pay | Admitting: Hematology

## 2019-02-10 ENCOUNTER — Inpatient Hospital Stay (HOSPITAL_BASED_OUTPATIENT_CLINIC_OR_DEPARTMENT_OTHER): Payer: Medicare HMO | Admitting: Hematology

## 2019-02-10 ENCOUNTER — Telehealth: Payer: Self-pay | Admitting: *Deleted

## 2019-02-10 ENCOUNTER — Inpatient Hospital Stay: Payer: Medicare HMO

## 2019-02-10 ENCOUNTER — Telehealth: Payer: Self-pay | Admitting: Hematology

## 2019-02-10 ENCOUNTER — Other Ambulatory Visit: Payer: Self-pay | Admitting: *Deleted

## 2019-02-10 ENCOUNTER — Telehealth: Payer: Self-pay | Admitting: Nutrition

## 2019-02-10 ENCOUNTER — Ambulatory Visit: Payer: Medicare HMO | Admitting: Radiation Oncology

## 2019-02-10 ENCOUNTER — Other Ambulatory Visit: Payer: Self-pay

## 2019-02-10 VITALS — BP 120/76 | HR 78 | Temp 98.8°F | Resp 18 | Ht 62.0 in | Wt 161.9 lb

## 2019-02-10 DIAGNOSIS — R109 Unspecified abdominal pain: Secondary | ICD-10-CM | POA: Diagnosis not present

## 2019-02-10 DIAGNOSIS — C321 Malignant neoplasm of supraglottis: Secondary | ICD-10-CM

## 2019-02-10 DIAGNOSIS — R1310 Dysphagia, unspecified: Secondary | ICD-10-CM | POA: Diagnosis not present

## 2019-02-10 DIAGNOSIS — C778 Secondary and unspecified malignant neoplasm of lymph nodes of multiple regions: Secondary | ICD-10-CM | POA: Diagnosis not present

## 2019-02-10 DIAGNOSIS — Z510 Encounter for antineoplastic radiation therapy: Secondary | ICD-10-CM | POA: Diagnosis not present

## 2019-02-10 DIAGNOSIS — Z95828 Presence of other vascular implants and grafts: Secondary | ICD-10-CM | POA: Insufficient documentation

## 2019-02-10 DIAGNOSIS — I700 Atherosclerosis of aorta: Secondary | ICD-10-CM | POA: Diagnosis not present

## 2019-02-10 DIAGNOSIS — R05 Cough: Secondary | ICD-10-CM | POA: Diagnosis not present

## 2019-02-10 LAB — BASIC METABOLIC PANEL - CANCER CENTER ONLY
Anion gap: 10 (ref 5–15)
BUN: 15 mg/dL (ref 8–23)
CO2: 25 mmol/L (ref 22–32)
Calcium: 9.8 mg/dL (ref 8.9–10.3)
Chloride: 102 mmol/L (ref 98–111)
Creatinine: 0.76 mg/dL (ref 0.44–1.00)
GFR, Est AFR Am: >60 mL/min (ref >60)
GFR, Estimated: >60 mL/min (ref >60)
Glucose, Bld: 96 mg/dL (ref 70–99)
Potassium: 4 mmol/L (ref 3.5–5.1)
Sodium: 137 mmol/L (ref 135–145)

## 2019-02-10 LAB — CBC WITH DIFFERENTIAL (CANCER CENTER ONLY)
Abs Immature Granulocytes: 0.03 10*3/uL (ref 0.00–0.07)
Basophils Absolute: 0 10*3/uL (ref 0.0–0.1)
Basophils Relative: 0 %
Eosinophils Absolute: 0.2 10*3/uL (ref 0.0–0.5)
Eosinophils Relative: 2 %
HCT: 38.4 % (ref 36.0–46.0)
Hemoglobin: 12.1 g/dL (ref 12.0–15.0)
Immature Granulocytes: 0 %
Lymphocytes Relative: 28 %
Lymphs Abs: 2.7 10*3/uL (ref 0.7–4.0)
MCH: 26 pg (ref 26.0–34.0)
MCHC: 31.5 g/dL (ref 30.0–36.0)
MCV: 82.6 fL (ref 80.0–100.0)
Monocytes Absolute: 0.9 10*3/uL (ref 0.1–1.0)
Monocytes Relative: 10 %
Neutro Abs: 5.7 10*3/uL (ref 1.7–7.7)
Neutrophils Relative %: 60 %
Platelet Count: 323 10*3/uL (ref 150–400)
RBC: 4.65 MIL/uL (ref 3.87–5.11)
RDW: 14.6 % (ref 11.5–15.5)
WBC Count: 9.5 10*3/uL (ref 4.0–10.5)
nRBC: 0 % (ref 0.0–0.2)

## 2019-02-10 LAB — MAGNESIUM: Magnesium: 2.2 mg/dL (ref 1.7–2.4)

## 2019-02-10 MED ORDER — SODIUM CHLORIDE 0.9% FLUSH
10.0000 mL | INTRAVENOUS | Status: DC | PRN
  Administered 2019-02-10: 10 mL
  Filled 2019-02-10: qty 10

## 2019-02-10 NOTE — Telephone Encounter (Signed)
Contacted patient's partner to verify appt thru telephone

## 2019-02-10 NOTE — Telephone Encounter (Signed)
Per 5/20 los No change in appts.

## 2019-02-11 ENCOUNTER — Inpatient Hospital Stay: Payer: Medicare HMO | Admitting: Nutrition

## 2019-02-11 ENCOUNTER — Other Ambulatory Visit: Payer: Self-pay | Admitting: Hematology

## 2019-02-11 ENCOUNTER — Inpatient Hospital Stay: Payer: Medicare HMO

## 2019-02-11 ENCOUNTER — Ambulatory Visit
Admission: RE | Admit: 2019-02-11 | Discharge: 2019-02-11 | Disposition: A | Discharge status: Home / Self Care | Payer: Medicare HMO | Source: Ambulatory Visit | Attending: Radiation Oncology | Admitting: Radiation Oncology

## 2019-02-11 VITALS — BP 121/65 | HR 74 | Temp 98.8°F | Resp 17

## 2019-02-11 DIAGNOSIS — C321 Malignant neoplasm of supraglottis: Secondary | ICD-10-CM

## 2019-02-11 DIAGNOSIS — I700 Atherosclerosis of aorta: Secondary | ICD-10-CM | POA: Diagnosis not present

## 2019-02-11 DIAGNOSIS — R05 Cough: Secondary | ICD-10-CM | POA: Diagnosis not present

## 2019-02-11 DIAGNOSIS — Z510 Encounter for antineoplastic radiation therapy: Secondary | ICD-10-CM | POA: Diagnosis not present

## 2019-02-11 DIAGNOSIS — R1310 Dysphagia, unspecified: Secondary | ICD-10-CM | POA: Diagnosis not present

## 2019-02-11 DIAGNOSIS — C329 Malignant neoplasm of larynx, unspecified: Secondary | ICD-10-CM

## 2019-02-11 DIAGNOSIS — C778 Secondary and unspecified malignant neoplasm of lymph nodes of multiple regions: Secondary | ICD-10-CM | POA: Diagnosis not present

## 2019-02-11 MED ORDER — SODIUM CHLORIDE 0.9 % IV SOLN
40.0000 mg/m2 | Freq: Once | INTRAVENOUS | Status: AC
  Administered 2019-02-11: 72 mg via INTRAVENOUS
  Filled 2019-02-11: qty 72

## 2019-02-11 MED ORDER — HEPARIN SOD (PORK) LOCK FLUSH 100 UNIT/ML IV SOLN
500.0000 [IU] | Freq: Once | INTRAVENOUS | Status: AC | PRN
  Administered 2019-02-11: 500 [IU]
  Filled 2019-02-11: qty 5

## 2019-02-11 MED ORDER — POTASSIUM CHLORIDE 2 MEQ/ML IV SOLN
Freq: Once | INTRAVENOUS | Status: AC
  Administered 2019-02-11: 08:00:00 via INTRAVENOUS
  Filled 2019-02-11: qty 10

## 2019-02-11 MED ORDER — SODIUM CHLORIDE 0.9 % IV SOLN
Freq: Once | INTRAVENOUS | Status: AC
  Administered 2019-02-11: 08:00:00 via INTRAVENOUS
  Filled 2019-02-11: qty 250

## 2019-02-11 MED ORDER — PALONOSETRON HCL INJECTION 0.25 MG/5ML
0.2500 mg | Freq: Once | INTRAVENOUS | Status: AC
  Administered 2019-02-11: 0.25 mg via INTRAVENOUS

## 2019-02-11 MED ORDER — PALONOSETRON HCL INJECTION 0.25 MG/5ML
INTRAVENOUS | Status: AC
  Filled 2019-02-11: qty 5

## 2019-02-11 MED ORDER — SODIUM CHLORIDE 0.9 % IV SOLN
Freq: Once | INTRAVENOUS | Status: AC
  Administered 2019-02-11: 10:00:00 via INTRAVENOUS
  Filled 2019-02-11: qty 5

## 2019-02-11 MED ORDER — MORPHINE SULFATE (CONCENTRATE) 10 MG /0.5 ML PO SOLN: 10.0000 mg | Freq: Four times a day (QID) | ORAL | 0 refills | Status: DC | PRN

## 2019-02-11 MED ORDER — SODIUM CHLORIDE 0.9% FLUSH
10.0000 mL | INTRAVENOUS | Status: DC | PRN
  Administered 2019-02-11: 10 mL
  Filled 2019-02-11: qty 10

## 2019-02-11 NOTE — Patient Instructions (Signed)
[facility] Discharge Instructions for Patients Receiving Chemotherapy  Today you received the following chemotherapy agents: Cisplatin  To help prevent nausea and vomiting after your treatment, we encourage you to take your nausea medication as directed.   If you develop nausea and vomiting that is not controlled by your nausea medication, call the clinic.   BELOW ARE SYMPTOMS THAT SHOULD BE REPORTED IMMEDIATELY:  *FEVER GREATER THAN 100.5 F  *CHILLS WITH OR WITHOUT FEVER  NAUSEA AND VOMITING THAT IS NOT CONTROLLED WITH YOUR NAUSEA MEDICATION  *UNUSUAL SHORTNESS OF BREATH  *UNUSUAL BRUISING OR BLEEDING  TENDERNESS IN MOUTH AND THROAT WITH OR WITHOUT PRESENCE OF ULCERS  *URINARY PROBLEMS  *BOWEL PROBLEMS  UNUSUAL RASH Items with * indicate a potential emergency and should be followed up as soon as possible.  Feel free to call the clinic should you have any questions or concerns. The clinic phone number is [phone].  Please show the [facility] at check-in to the Emergency Department and triage nurse.  Cisplatin injection What is this medicine? CISPLATIN (SIS pla tin) is a chemotherapy drug. It targets fast dividing cells, like cancer cells, and causes these cells to die. This medicine is used to treat many types of cancer like bladder, ovarian, and testicular cancers. This medicine may be used for other purposes; ask your health care provider or pharmacist if you have questions. COMMON BRAND NAME(S): Platinol, Platinol -AQ What should I tell my health care provider before I take this medicine? They need to know if you have any of these conditions: -blood disorders -hearing problems -kidney disease -recent or ongoing radiation therapy -an unusual or allergic reaction to cisplatin, carboplatin, other chemotherapy, other medicines, foods, dyes, or preservatives -pregnant or trying to get pregnant -breast-feeding How should I use this medicine?  This drug is given as an infusion into a vein. It is administered in a hospital or clinic by a specially trained health care professional. Talk to your pediatrician regarding the use of this medicine in children. Special care may be needed. Overdosage: If you think you have taken too much of this medicine contact a poison control center or emergency room at once. NOTE: This medicine is only for you. Do not share this medicine with others. What if I miss a dose? It is important not to miss a dose. Call your doctor or health care professional if you are unable to keep an appointment. What may interact with this medicine? -dofetilide -foscarnet -medicines for seizures -medicines to increase blood counts like filgrastim, pegfilgrastim, sargramostim -probenecid -pyridoxine used with altretamine -rituximab -some antibiotics like amikacin, gentamicin, neomycin, polymyxin B, streptomycin, tobramycin -sulfinpyrazone -vaccines -zalcitabine Talk to your doctor or health care professional before taking any of these medicines: -acetaminophen -aspirin -ibuprofen -ketoprofen -naproxen This list may not describe all possible interactions. Give your health care provider a list of all the medicines, herbs, non-prescription drugs, or dietary supplements you use. Also tell them if you smoke, drink alcohol, or use illegal drugs. Some items may interact with your medicine. What should I watch for while using this medicine? Your condition will be monitored carefully while you are receiving this medicine. You will need important blood work done while you are taking this medicine. This drug may make you feel generally unwell. This is not uncommon, as chemotherapy can affect healthy cells as well as cancer cells. Report any side effects. Continue your course of treatment even though you feel ill unless your doctor tells you to stop.  In some cases, you may be given additional medicines to help with side effects.  Follow all directions for their use. Call your doctor or health care professional for advice if you get a fever, chills or sore throat, or other symptoms of a cold or flu. Do not treat yourself. This drug decreases your body's ability to fight infections. Try to avoid being around people who are sick. This medicine may increase your risk to bruise or bleed. Call your doctor or health care professional if you notice any unusual bleeding. Be careful brushing and flossing your teeth or using a toothpick because you may get an infection or bleed more easily. If you have any dental work done, tell your dentist you are receiving this medicine. Avoid taking products that contain aspirin, acetaminophen, ibuprofen, naproxen, or ketoprofen unless instructed by your doctor. These medicines may hide a fever. Do not become pregnant while taking this medicine. Women should inform their doctor if they wish to become pregnant or think they might be pregnant. There is a potential for serious side effects to an unborn child. Talk to your health care professional or pharmacist for more information. Do not breast-feed an infant while taking this medicine. Drink fluids as directed while you are taking this medicine. This will help protect your kidneys. Call your doctor or health care professional if you get diarrhea. Do not treat yourself. What side effects may I notice from receiving this medicine? Side effects that you should report to your doctor or health care professional as soon as possible: -allergic reactions like skin rash, itching or hives, swelling of the face, lips, or tongue -signs of infection - fever or chills, cough, sore throat, pain or difficulty passing urine -signs of decreased platelets or bleeding - bruising, pinpoint red spots on the skin, black, tarry stools, nosebleeds -signs of decreased red blood cells - unusually weak or tired, fainting spells, lightheadedness -breathing problems -changes in  hearing -gout pain -low blood counts - This drug may decrease the number of white blood cells, red blood cells and platelets. You may be at increased risk for infections and bleeding. -nausea and vomiting -pain, swelling, redness or irritation at the injection site -pain, tingling, numbness in the hands or feet -problems with balance, movement -trouble passing urine or change in the amount of urine Side effects that usually do not require medical attention (report to your doctor or health care professional if they continue or are bothersome): -changes in vision -loss of appetite -metallic taste in the mouth or changes in taste This list may not describe all possible side effects. Call your doctor for medical advice about side effects. You may report side effects to FDA at 1-800-FDA-1088. Where should I keep my medicine? This drug is given in a hospital or clinic and will not be stored at home. NOTE: This sheet is a summary. It may not cover all possible information. If you have questions about this medicine, talk to your doctor, pharmacist, or health care provider.  2019 Elsevier/Gold Standard (2007-12-15 14:40:54)   PEG Tube Home Guide A PEG tube is used to put food and fluids into the stomach. Before you leave the hospital, make sure that you know:  How to care for your PEG tube.  How to care for the opening (stoma) in your belly.  How to give yourself a feeding.  How to give yourself medicines.  When to call your doctor for help. Supplies needed:  [employer], plain water  Clean  washcloth  Gauze pad  Syringe How to care for a PEG tube Check your PEG tube every day. Make sure:  It is not too tight.  It is in the correct place. There is a mark on the tube that shows when the tube is in the correct place. Adjust the tube if you need to. Cleaning your stoma Clean your stoma every day. Follow these steps: 1. Wash your hands with soap and water. If soap and  water are not available, use hand sanitizer. 2. Check your stoma. Let your doctor know if there is: ? Redness. ? Leaking. ? Skin irritation. 3. Wash the stoma gently with warm, soapy water. 4. Rinse the stoma with plain water. 5. Pat the stoma area dry. 6. You may place a gauze pad around the opening of your stoma.  Giving a feeding Your doctor will tell you:  How much nutrition and fluid you will need for each feeding.  How often to have a feeding.  Whether you should take medicine in the tube by itself or with a feeding. To give yourself a feeding, follow these steps: 1. Lay out all of the things that you will need. 2. Make sure that the nutritional formula is at room temperature. 3. Wash your hands with soap and water. 4. Sit up or stand up straight. You will need to stay sitting up or standing up while you give yourself a feeding. 5. Make sure the syringe plunger is pushed in. Place the tip of the syringe in clean water, and slowly pull the plunger to bring (draw up) the water into the syringe. 6. Remove the clamp and the cap from the PEG tube. 7. Push the water out of the syringe to clean (flush) the tube. 8. If the tube is clear, draw up the formula into the syringe. Make sure to use the right amount for each feeding. Add water if you need to. 9. Slowly push the formula from the syringe through the tube. 10. After the feeding, flush the tube with water. 11. Put the clamp and the cap on the tube. 12. Stay sitting up or standing up straight for at least 30 minutes. Giving medicine To give yourself medicine, follow these steps: 1. Lay out all of the things that you will need. 2. If your medicine is in tablet form, crush it and dissolve it in water. 3. Wash your hands with soap and water. 4. Sit up or stand up straight. You will need to stay sitting up or standing up while you give yourself medicine. 5. Make sure the syringe plunger is pushed in. Place the tip of the syringe in  clean water, and slowly pull the plunger to bring (draw up) the water into the syringe. 6. Remove the clamp and the cap from the PEG tube. 7. Push the water out of the syringe to clean (flush) the tube. 8. If the tube is clear, draw up the medicine into the syringe. 9. Slowly push the medicine from the syringe through the tube. 10. Flush the tube with water. 11. Put the clamp and the cap on the tube. 12. Stay sitting up or standing up straight for at least 30 minutes. Do not take sustained release (SR) medicines through your tube. If you are not sure if your medicine is a SR medicine, ask your doctor. Contact a doctor if:  The area around your stoma is sore, irritated, or red.  You have belly pain or bloating while you are feeding, or  after you feed.  You feel sick to your stomach (nausea) for a long time.  You cannot poop (constipated) or you have watery poop (diarrhea) for a long time.  You have a fever.  You have problems with your PEG tube. Get help right away if:  Your tube is blocked.  Your tube falls out.  You have pain around your tube.  You are bleeding from your tube.  Your tube is leaking.  You choke or you have trouble breathing while you are feeding or after you feed. Summary  A PEG tube is used to put food and fluids into the stomach.  Before you leave the hospital, you will be taught how to use and care for your PEG tube.  Your doctor will give you instructions on how to give yourself feedings and medicines.  Contact your doctor if you have fever or soreness, redness, or irritation around your stoma.  Get help right away if your tube leaks, is blocked, or falls out. Also, get help right away if you have pain or bleeding around your stoma. This information is not intended to replace advice given to you by your health care provider. Make sure you discuss any questions you have with your health care provider. Document Released: 10/12/2010 Document Revised:  09/22/2017 Document Reviewed: 09/22/2017 Elsevier Interactive Patient Education  2019 [employer].

## 2019-02-11 NOTE — Progress Notes (Signed)
RD working remotely.  Spoke with patient and [name]. Reports wt stable and eating pretty well. Continues pureed diet with Boost high Protein BID. Received 2 weeks of pureed meals from [facility] and would like them to be renewed. She is not using her feeding tube for nutrition support.  Nutrition Diagnosis: Unintentional wt loss continues.  Intervention: Educated to continue increased calorie and protein intake. Encouraged increased fluids. Suggested she could put 8 oz of water into tube at a time to help with increasing fluids. Recommend she contact [facility] to inquire about pureed meals. Questions answered.  Monitoring, Evaluation, Goals: Increase calories and protein to minimize weight loss.  Next Visit:Thursday, May 28.

## 2019-02-12 ENCOUNTER — Ambulatory Visit
Admission: RE | Admit: 2019-02-12 | Discharge: 2019-02-12 | Disposition: A | Discharge status: Home / Self Care | Payer: Medicare HMO | Source: Ambulatory Visit | Attending: Radiation Oncology | Admitting: Radiation Oncology

## 2019-02-12 ENCOUNTER — Other Ambulatory Visit: Payer: Self-pay

## 2019-02-12 DIAGNOSIS — C321 Malignant neoplasm of supraglottis: Secondary | ICD-10-CM | POA: Diagnosis not present

## 2019-02-12 DIAGNOSIS — Z930 Tracheostomy status: Secondary | ICD-10-CM | POA: Diagnosis not present

## 2019-02-12 DIAGNOSIS — Z431 Encounter for attention to gastrostomy: Secondary | ICD-10-CM | POA: Diagnosis not present

## 2019-02-12 DIAGNOSIS — Z510 Encounter for antineoplastic radiation therapy: Secondary | ICD-10-CM | POA: Diagnosis not present

## 2019-02-12 DIAGNOSIS — Z87891 Personal history of nicotine dependence: Secondary | ICD-10-CM | POA: Diagnosis not present

## 2019-02-13 ENCOUNTER — Telehealth: Payer: Self-pay | Admitting: *Deleted

## 2019-02-13 NOTE — Telephone Encounter (Signed)
Oncology Nurse Navigator Documentation  Called [name] [name] and her partner to provide PEG education. . We reviewed contents of Start of Care Bolus Feeding Kit provided by [facility] (formerly [facility]ay of PEG placement.    . Using printed guidelines she received earlier, discussed procedures for PEG use and care, including: hand hygiene, gravity bolus administration of daily water flushes and nutritional supplement, fluids and medications; care of tube insertion site including daily dressing change and cleaning; S&S of infection.   . Partner verbalized successful gravity administration of water, dressing change and cleaning procedures.  . I provided written instructions for PEG flushing/dressing change in support of verbal instruction.   Marland Kitchen Described contents of He understands I will be available for ongoing PEG support.  I encouraged them to call me with questions/concerns.  The voiced understanding.

## 2019-02-16 ENCOUNTER — Other Ambulatory Visit: Payer: Self-pay | Admitting: Radiation Oncology

## 2019-02-16 ENCOUNTER — Ambulatory Visit
Admission: RE | Admit: 2019-02-16 | Discharge: 2019-02-16 | Disposition: A | Discharge status: Home / Self Care | Payer: Medicare HMO | Source: Ambulatory Visit | Attending: Radiation Oncology | Admitting: Radiation Oncology

## 2019-02-16 ENCOUNTER — Other Ambulatory Visit: Payer: Self-pay

## 2019-02-16 DIAGNOSIS — C321 Malignant neoplasm of supraglottis: Secondary | ICD-10-CM | POA: Diagnosis not present

## 2019-02-16 DIAGNOSIS — Z510 Encounter for antineoplastic radiation therapy: Secondary | ICD-10-CM | POA: Diagnosis not present

## 2019-02-16 MED ORDER — SONAFINE EX EMUL
1.0000 "application " | Freq: Once | CUTANEOUS | Status: AC
  Administered 2019-02-16: 1 via TOPICAL

## 2019-02-16 MED ORDER — LIDOCAINE VISCOUS HCL 2 % MT SOLN: OROMUCOSAL | 5 refills | Status: DC

## 2019-02-16 NOTE — Progress Notes (Signed)
[facility] OFFICE PROGRESS NOTE  Patient Care Team: Philmore Pali, NP as PCP - General (Nurse Practitioner) Eppie Gibson, MD as Attending Physician (Radiation Oncology) Tish Men, MD as Consulting Physician (Hematology) Leota Sauers, RN as Oncology Nurse Navigator Karie Mainland, RD as Dietitian (Nutrition) Sharen Counter, CCC-SLP as Speech Language Pathologist (Speech Pathology)  HEME/ONC OVERVIEW: 1.  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 Suspicious <1cm bilateral Level II cervical LN's.   Transferred to [facility] for airway compromise and taken to OR for tracheostomy, bx of the supraglottic lesion did not show any malignancy  Bx of the left cervical LN was non-diagnostic -12/2018: repeat DL with bx of the supraglottic mass showed well-differentiated invasive squamous cell carcinoma  -01/2019: PET showed locally advanced FDG-avid laryngeal neoplasm with bilateral cervical LN involvement as well as mildly FDG-avid mediastinal LN's, concerning for metastatic disease; EUS w/ bx of the mediastinal LN negative for malignancy -Mid-01/2019 - present: definitive chemoradiation with weekly cisplatin   2. 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 -S/p 1 cycle of weekly cisplatin concurrent with RT in the definitive setting  -Labs adequate today, proceed with Cycle 2 of chemotherapy -PRN anti-emetics: Zofran, Compazine, Ativan and dexamethasone  Hyponatremia -Na 134 today, new -Patient is asymptomatic -Patient reports drinking  some water, as well as Gatorade and some soda -I encouraged the patient to minimize caffeinated drinks, as they can function as diuretics, and continue fluids such as water and Gatorade -We will monitor it for now    Chemotherapy-associated mucositis -Secondary to chemotherapy -Currently on liquid IR morphine 71m/0.5mL q4hrs PRN -Pain relatively well controlled -Continue PRN viscous lidocaine swish and swallow and PRN morphine -I encouraged the patient to use salt/soda rinses regularly for oral hygiene  Abdominal pain -Secondary to recent PEG tube placement -Currently on IR liquid morphine 167m0.5mL q4hrs PRN  -Pain relatively well controlled -Continue the regimen above    All questions were answered. The patient knows to call the clinic with any problems, questions or concerns. No barriers to learning was detected.  Return in 1 week for labs, port flush, clinic appt and Cycle 3 of weekly cisplatin.  YaTish MenMD 02/17/2019 10:45 AM  CHIEF COMPLAINT: "My throat is a little sore"  INTERVAL HISTORY: Ms. FaPippeneturns to clinic for follow-up of squamous cell carcinoma of the supraglottic on definitive chemoradiation with weekly cisplatin.  Patient reports that she has had some mild soreness of the throat, for which she uses viscous lidocaine swish and swallow 2-3 times per day, which helps with this sore throat.  In addition, she also takes IR liquid morphine approximately 1-2 times per day, which helps with her throat pain as well as the mild intermittent abdominal pain from recent feeding tube placement.  She has been able to maintain eating and drinking so far, and her weight has been relatively stable.  She denies any nausea, vomiting, diarrhea, or numbness or tingling in hands and feet.  SUMMARY OF ONCOLOGIC HISTORY:   [place]ottis [place]  12/[place]/2018 Imaging  CT neck: IMPRESSION: 1. Supraglottic laryngeal carcinoma extending across the  anterior commissure and invading the prelaryngeal fat, left aryepiglottic fold and left aspect of the epiglottis. 2. Severe narrowing of the laryngeal airway. 3. Bilateral subcentimeter level 2A and 2A cervical lymph nodes.  Critical Value/emergent results were called by telephone at the time of interpretation on 12/02/2018 at 5:34 pm to Dr. Collier Salina, who verbally acknowledged these results.    12/02/2018 Imaging    CT  Chest:  IMPRESSION: No evidence of metastatic disease in the chest.    12/03/2018 Pathology Results    (Care Everywhere; [facility]) A: Larynx, supraglottis, biopsy - Atypical squamous proliferation with at least low grade dysplasia and abundant keratinization - No definite high grade dysplasia or invasive carcinoma represented (see comment)    12/18/2018 Pathology Results    (Care Everywhere; [facility]) A:  Neck, left, fine needle aspiration - Rare single atypical cells, no cells diagnostic of malignancy - Lymph node material present  B:  Neck, right, fine needle aspiration - Non-diagnostic specimen - No lymph node material identified - Mixed inflammation and blood    12/31/2018 Pathology Results    (Care Everywhere; [facility])  A: Larynx, left supraglottic mass, biopsy - Atypical squamous proliferation with abundant keratinization, suspicious for well-differentiated invasive squamous cell carcinoma  B: Larynx, left supraglottic mass, biopsy - Well-differentiated invasive squamous cell carcinoma with abundant keratinization (see comment)    01/18/2019 Initial Diagnosis    Laryngeal cancer ([place])    01/26/2019 Imaging    PET: IMPRESSION: 1. Locally advanced hypermetabolic laryngeal neoplasm centered in the left supraglottic space with bilateral glottic and epiglottic involvement and subglottic extension on the left. 2. Hypermetabolic bilateral level 3 neck nodal metastases. 3. Hypermetabolic high mediastinal nodal metastases between the trachea  and upper thoracic esophagus. 4. Otherwise no distant hypermetabolic metastatic disease. 5. Nodular 7 mm focus at the left lung base, below PET resolution, recommend attention on follow-up chest CT in 3 months. 6. Chronic findings include: Aortic Atherosclerosis (ICD10-I70.0). Cholelithiasis.    02/08/2019 Cancer Staging    Staging form: Larynx - Supraglottis, AJCC 8th Edition - Clinical stage from 02/08/2019: Stage IVA (cT3, cN2c, cM0) - Signed by Eppie Gibson, MD on 02/08/2019    02/11/2019 -  Chemotherapy    The patient had palonosetron (ALOXI) injection 0.25 mg, 0.25 mg, Intravenous,  Once, 1 of 7 cycles Administration: 0.25 mg (02/11/2019) CISplatin (PLATINOL) 72 mg in sodium chloride 0.9 % 250 mL chemo infusion, 40 mg/m2 = 72 mg, Intravenous,  Once, 1 of 7 cycles Administration: 72 mg (02/11/2019) fosaprepitant (EMEND) 150 mg, dexamethasone (DECADRON) 12 mg in sodium chloride 0.9 % 145 mL IVPB, , Intravenous,  Once, 1 of 7 cycles Administration:  (02/11/2019)  for chemotherapy treatment.      REVIEW OF SYSTEMS:   Constitutional: ( - ) fevers, ( - )  chills , ( - ) night sweats Eyes: ( - ) blurriness of vision, ( - ) double vision, ( - ) watery eyes Ears, nose, mouth, throat, and face: ( + ) mucositis, ( + ) sore throat Respiratory: ( - ) cough, ( - ) dyspnea, ( - ) wheezes Cardiovascular: ( - ) palpitation, ( - ) chest discomfort, ( - ) lower extremity swelling Gastrointestinal:  ( - ) nausea, ( - ) heartburn, ( - ) change in bowel habits Skin: ( - ) abnormal skin rashes Lymphatics: ( - ) new lymphadenopathy, ( - ) easy bruising Neurological: ( - )  numbness, ( - ) tingling, ( - ) new weaknesses Behavioral/Psych: ( - ) mood change, ( - ) new changes  All other systems were reviewed with the patient and are negative.  I have reviewed the past medical history, past surgical history, social history and family history with the patient and they are unchanged from previous  note.  ALLERGIES:  has No Known Allergies.  MEDICATIONS:  Current Outpatient Medications  Medication Sig Dispense Refill  . chlorhexidine (PERIDEX) 0.12 % solution Rinse with 15 mls twice daily for 30 seconds. Use after breakfast and at bedtime. Spit out excess. Do not swallow. (Patient taking differently: Use as directed 15 mLs in the mouth or throat 2 (two) times daily. Rinse with 15 mls twice daily for 30 seconds. Use after breakfast and at bedtime. Spit out excess. Do not swallow.) 480 mL prn  . docusate sodium (COLACE) 100 MG capsule Take 100 mg by mouth 2 (two) times daily.    Marland Kitchen HYDROcodone-acetaminophen (HYCET) 7.5-325 mg/15 ml solution Take 15 mLs by mouth 4 (four) times daily as needed for moderate pain. 120 mL 0  . levothyroxine (SYNTHROID) 150 MCG tablet Take 150 mcg by mouth every other day. Alternating with 175 mcg dose    . levothyroxine (SYNTHROID) 175 MCG tablet Take 175 mcg by mouth every other day. Alternating with 150 mcg dose    . lidocaine (XYLOCAINE) 2 % solution Patient: Mix 1part 2% viscous lidocaine, 1part H20. Swallow 71m of diluted mixture, 3101m before meals and at bedtime, up to QID prn soreness 100 mL 5  . lidocaine-prilocaine (EMLA) cream Apply to affected area once 30 g 3  . Morphine Sulfate (MORPHINE CONCENTRATE) 10 mg / 0.5 ml concentrated solution Take 0.5 mLs (10 mg total) by mouth every 6 (six) hours as needed for up to 30 days for severe pain. 120 mL 0  . sodium fluoride (PREVIDENT 5000 PLUS) 1.1 % CREA dental cream Apply cream to tooth brush. Brush teeth for 2 minutes. Spit out excess. DO NOT rinse afterwards. Repeat nightly. (Patient taking differently: Place 1 application onto teeth 2 (two) times a day. ) 1 Tube prn  . dexamethasone (DECADRON) 4 MG tablet Take 2 tablets by mouth once a day on the day after chemotherapy and then take 2 tablets two times a day for 2 days. Take with food. (Patient not taking: Reported on 02/10/2019) 30 tablet 1  . LORazepam  (ATIVAN) 0.5 MG tablet Take 1 tablet (0.5 mg total) by mouth every 6 (six) hours as needed (Nausea or vomiting). (Patient not taking: Reported on 02/10/2019) 30 tablet 0  . ondansetron (ZOFRAN) 8 MG tablet Take 1 tablet (8 mg total) by mouth 2 (two) times daily as needed. Start on the third day after chemotherapy. (Patient not taking: Reported on 02/10/2019) 30 tablet 1  . prochlorperazine (COMPAZINE) 10 MG tablet Take 1 tablet (10 mg total) by mouth every 6 (six) hours as needed (Nausea or vomiting). (Patient not taking: Reported on 02/10/2019) 30 tablet 1   No current facility-administered medications for this visit.     PHYSICAL EXAMINATION: ECOG PERFORMANCE STATUS: 1 - Symptomatic but completely ambulatory  Today's Vitals   02/17/19 1023 02/17/19 1024  BP: 106/62   Pulse: 65   Resp: 18   Temp: 98.4 F (36.9 C)   TempSrc: Oral   SpO2: 97%   Weight: 160 lb 8 oz (72.8 kg)   Height: 5' 2"  (1.575 m)   PainSc:  0-No pain   Body  mass index is 29.36 kg/m.  Filed Weights   02/17/19 1023  Weight: 160 lb 8 oz (72.8 kg)    GENERAL: alert, no distress and comfortable SKIN: skin color, texture, turgor are normal, no rashes or significant lesions EYES: conjunctiva are pink and non-injected, sclera clear OROPHARYNX: Grade 1 mucositis, no oral candidiasis  NECK: supple, non-tender, tracheostomy in place  LUNGS: clear to auscultation with normal breathing effort HEART: regular rate & rhythm and no murmurs and no lower extremity edema ABDOMEN: soft, non-tender, non-distended, normal bowel sounds Musculoskeletal: no cyanosis of digits and no clubbing  PSYCH: alert & oriented x 3, fluent speech NEURO: no focal motor/sensory deficits  LABORATORY DATA:  I have reviewed the data as listed    Component Value Date/Time   NA 134 (L) 02/17/2019 1000   K 3.9 02/17/2019 1000   CL 99 02/17/2019 1000   CO2 25 02/17/2019 1000   GLUCOSE 88 02/17/2019 1000   BUN 13 02/17/2019 1000   CREATININE 0.75  02/17/2019 1000   CALCIUM 9.3 02/17/2019 1000   PROT 6.9 02/17/2019 1000   ALBUMIN 3.4 (L) 02/17/2019 1000   AST 17 02/17/2019 1000   ALT 25 02/17/2019 1000   ALKPHOS 93 02/17/2019 1000   BILITOT 0.5 02/17/2019 1000   GFRNONAA >60 02/17/2019 1000   GFRAA >60 02/17/2019 1000    No results found for: SPEP, UPEP  Lab Results  Component Value Date   WBC 7.1 02/17/2019   NEUTROABS 4.1 02/17/2019   HGB 12.1 02/17/2019   HCT 37.5 02/17/2019   MCV 80.5 02/17/2019   PLT 347 02/17/2019      Chemistry      Component Value Date/Time   NA 134 (L) 02/17/2019 1000   K 3.9 02/17/2019 1000   CL 99 02/17/2019 1000   CO2 25 02/17/2019 1000   BUN 13 02/17/2019 1000   CREATININE 0.75 02/17/2019 1000      Component Value Date/Time   CALCIUM 9.3 02/17/2019 1000   ALKPHOS 93 02/17/2019 1000   AST 17 02/17/2019 1000   ALT 25 02/17/2019 1000   BILITOT 0.5 02/17/2019 1000       RADIOGRAPHIC STUDIES: I have personally reviewed the radiological images as listed below and agreed with the findings in the report. Ir Gastrostomy Tube Mod Sed  Result Date: 02/09/2019 CLINICAL DATA:  Supraglottic carcinoma, needs enteral feeding support EXAM: PERC PLACEMENT GASTROSTOMY FLUOROSCOPY TIME:  1.3 minute; 450  uGym2 DAP TECHNIQUE: The procedure, risks, benefits, and alternatives were explained to the patient. Questions regarding the procedure were encouraged and answered. The patient understands and consents to the procedure. As antibiotic prophylaxis, cefazolin 2 g was ordered pre-procedure and administered intravenously within one hour of incision. An appropriate percutaneous approach was confirmed on recent PET-CT. A 5 French angiographic catheter was placed as orogastric tube. The upper abdomen was prepped with Betadine, draped in usual sterile fashion, and infiltrated locally with 1% lidocaine. Intravenous Fentanyl 167mg and Versed 367mwere administered as conscious sedation during continuous monitoring  of the patient's level of consciousness and physiological / cardiorespiratory status by the radiology RN, with a total moderate sedation time of 13 minutes. 1 mg glucagon given IV to facilitate gastric distention.Stomach was insufflated using air through the orogastric tube. An 1834rench sheath needle was advanced percutaneously into the gastric lumen under fluoroscopy. Gas could be aspirated and a small contrast injection confirmed intraluminal spread. The sheath was exchanged over a guidewire for a 9 Fr[place]ascular sheath, through  which the snare device was advanced and used to snare a guidewire passed through the orogastric tube. This was withdrawn, and the snare attached to the 20 French pull-through gastrostomy tube, which was advanced antegrade, positioned with the internal bumper securing the anterior gastric wall to the anterior abdominal wall. Small contrast injection confirms appropriate positioning. The external bumper was applied and the catheter was flushed. COMPLICATIONS: COMPLICATIONS none IMPRESSION: 1. Technically successful 20 French pull-through gastrostomy placement under fluoroscopy. Electronically Signed   By: Lucrezia Europe M.D.   On: 02/09/2019 13:39   Nm Pet Image Initial (pi) Skull Base To Thigh  Result Date: 01/26/2019 CLINICAL DATA:  Initial treatment strategy for supraglottic laryngeal cancer. EXAM: NUCLEAR MEDICINE PET SKULL BASE TO THIGH TECHNIQUE: 8.1 mCi F-18 FDG was injected intravenously. Full-ring PET imaging was performed from the skull base to thigh after the radiotracer. CT data was obtained and used for attenuation correction and anatomic localization. Fasting blood glucose: 92 mg/dl COMPARISON:  None. FINDINGS: Mediastinal blood pool activity: SUV max 2.4 NECK: There is a locally advanced hypermetabolic laryngeal mass measuring approximately 4.4 x 4.0 cm with max SUV 15.4 centered in the left supraglottic space, with bilateral glottic and epiglottic involvement and with  subglottic extension on the left. Bilateral hypermetabolic level 3 lymph nodes in the neck, for example 1.1 cm on the right with max SUV 6.3 (series 4/image 21) and 1.3 cm on the left with max SUV 5.7 (series 4/image 22). Incidental CT findings: Tracheostomy tube terminates in tracheal lumen at the level of the thoracic inlet. CHEST: There are mildly enlarged hypermetabolic high mediastinal lymph node between the trachea and upper thoracic esophagus, measuring 1.1 cm on the right with max SUV 3.9 (series 4/image 34) and 1.0 cm on the left with max SUV 3.8 (series 4/image 36). No enlarged or hypermetabolic axillary or hilar lymph nodes. No hypermetabolic pulmonary findings. Incidental CT findings: Atherosclerotic nonaneurysmal thoracic aorta. Nodular 7 mm peripheral basilar left lower lobe focus (series 8/image 41), below PET resolution. No additional significant pulmonary nodules. ABDOMEN/PELVIS: No abnormal hypermetabolic activity within the liver, pancreas, adrenal glands, or spleen. No hypermetabolic lymph nodes in the abdomen or pelvis. Nonspecific hypermetabolism at anorectal junction with max SUV 4.9, without discrete mass on the noncontrast CT images. Incidental CT findings: Cholelithiasis. Atherosclerotic nonaneurysmal abdominal aorta. SKELETON: No focal hypermetabolic activity to suggest skeletal metastasis. Incidental CT findings: Marked degenerative changes throughout the spine. IMPRESSION: 1. Locally advanced hypermetabolic laryngeal neoplasm centered in the left supraglottic space with bilateral glottic and epiglottic involvement and subglottic extension on the left. 2. Hypermetabolic bilateral level 3 neck nodal metastases. 3. Hypermetabolic high mediastinal nodal metastases between the trachea and upper thoracic esophagus. 4. Otherwise no distant hypermetabolic metastatic disease. 5. Nodular 7 mm focus at the left lung base, below PET resolution, recommend attention on follow-up chest CT in 3 months.  6. Chronic findings include: Aortic Atherosclerosis (ICD10-I70.0). Cholelithiasis. Electronically Signed   By: Ilona Sorrel M.D.   On: 01/26/2019 10:28   Ir Imaging Guided Port Insertion  Result Date: 02/02/2019 INDICATION: History of metastatic head neck cancer, in need of durable intravenous access for chemotherapy administration. EXAM: IMPLANTED PORT A CATH PLACEMENT WITH ULTRASOUND AND FLUOROSCOPIC GUIDANCE COMPARISON:  PET-CT - 01/26/2019 MEDICATIONS: Ancef 2 gm IV; The antibiotic was administered within an appropriate time interval prior to skin puncture. ANESTHESIA/SEDATION: Moderate (conscious) sedation was employed during this procedure. A total of Versed 2 mg and Fentanyl 100 mcg was administered intravenously. Moderate Sedation Time:  23 minutes. The patient's level of consciousness and vital signs were monitored continuously by radiology nursing throughout the procedure under my direct supervision. CONTRAST:  None FLUOROSCOPY TIME:  24 seconds (6 mGy) COMPLICATIONS: None immediate. PROCEDURE: The procedure, risks, benefits, and alternatives were explained to the patient. Questions regarding the procedure were encouraged and answered. The patient understands and consents to the procedure. The right neck and chest were prepped with chlorhexidine in a sterile fashion, and a sterile drape was applied covering the operative field. Maximum barrier sterile technique with sterile gowns and gloves were used for the procedure. A timeout was performed prior to the initiation of the procedure. Local anesthesia was provided with 1% lidocaine with epinephrine. After creating a small venotomy incision, a micropuncture kit was utilized to access the internal jugular vein. Real-time ultrasound guidance was utilized for vascular access including the acquisition of a permanent ultrasound image documenting patency of the accessed vessel. The microwire was utilized to measure appropriate catheter length. A subcutaneous  port pocket was then created along the upper chest wall utilizing a combination of sharp and blunt dissection. The pocket was irrigated with sterile saline. A single lumen slim power injectable port was chosen for placement. The 8 Fr catheter was tunneled from the port pocket site to the venotomy incision. The port was placed in the pocket. The external catheter was trimmed to appropriate length. At the venotomy, an 8 Fr peel-away sheath was placed over a guidewire under fluoroscopic guidance. The catheter was then placed through the sheath and the sheath was removed. Final catheter positioning was confirmed and documented with a fluoroscopic spot radiograph. The port was accessed with a Huber needle, aspirated and flushed with heparinized saline. The venotomy site was closed with an interrupted 4-0 Vicryl suture. The port pocket incision was closed with interrupted 2-0 Vicryl suture and the skin was opposed with a running subcuticular 4-0 Vicryl suture. Dermabond and Steri-strips were applied to both incisions. Dressings were placed. The patient tolerated the procedure well without immediate post procedural complication. FINDINGS: After catheter placement, the tip lies within the superior cavoatrial junction. The catheter aspirates and flushes normally and is ready for immediate use. IMPRESSION: Successful placement of a right internal jugular approach power injectable Port-A-Cath. The catheter is ready for immediate use. Electronically Signed   By: [name] M.D.   On: 02/02/2019 16:52

## 2019-02-16 NOTE — Progress Notes (Signed)

## 2019-02-17 ENCOUNTER — Telehealth: Payer: Self-pay | Admitting: Nutrition

## 2019-02-17 ENCOUNTER — Encounter: Payer: Self-pay | Admitting: Hematology

## 2019-02-17 ENCOUNTER — Inpatient Hospital Stay (HOSPITAL_BASED_OUTPATIENT_CLINIC_OR_DEPARTMENT_OTHER): Payer: Medicare HMO | Admitting: Hematology

## 2019-02-17 ENCOUNTER — Ambulatory Visit
Admission: RE | Admit: 2019-02-17 | Discharge: 2019-02-17 | Disposition: A | Discharge status: Home / Self Care | Payer: Medicare HMO | Source: Ambulatory Visit | Attending: Radiation Oncology | Admitting: Radiation Oncology

## 2019-02-17 ENCOUNTER — Inpatient Hospital Stay: Payer: Medicare HMO

## 2019-02-17 ENCOUNTER — Other Ambulatory Visit: Payer: Self-pay

## 2019-02-17 VITALS — BP 106/62 | HR 65 | Temp 98.4°F | Resp 18 | Ht 62.0 in | Wt 160.5 lb

## 2019-02-17 DIAGNOSIS — C321 Malignant neoplasm of supraglottis: Secondary | ICD-10-CM | POA: Diagnosis not present

## 2019-02-17 DIAGNOSIS — E871 Hypo-osmolality and hyponatremia: Secondary | ICD-10-CM

## 2019-02-17 DIAGNOSIS — C329 Malignant neoplasm of larynx, unspecified: Secondary | ICD-10-CM

## 2019-02-17 DIAGNOSIS — R109 Unspecified abdominal pain: Secondary | ICD-10-CM | POA: Diagnosis not present

## 2019-02-17 DIAGNOSIS — K1231 Oral mucositis (ulcerative) due to antineoplastic therapy: Secondary | ICD-10-CM | POA: Diagnosis not present

## 2019-02-17 DIAGNOSIS — Z510 Encounter for antineoplastic radiation therapy: Secondary | ICD-10-CM | POA: Diagnosis not present

## 2019-02-17 DIAGNOSIS — I700 Atherosclerosis of aorta: Secondary | ICD-10-CM | POA: Diagnosis not present

## 2019-02-17 DIAGNOSIS — R1310 Dysphagia, unspecified: Secondary | ICD-10-CM | POA: Diagnosis not present

## 2019-02-17 DIAGNOSIS — Z95828 Presence of other vascular implants and grafts: Secondary | ICD-10-CM

## 2019-02-17 DIAGNOSIS — R05 Cough: Secondary | ICD-10-CM | POA: Diagnosis not present

## 2019-02-17 DIAGNOSIS — C778 Secondary and unspecified malignant neoplasm of lymph nodes of multiple regions: Secondary | ICD-10-CM | POA: Diagnosis not present

## 2019-02-17 LAB — CMP (CANCER CENTER ONLY)
ALT: 25 U/L (ref 0–44)
AST: 17 U/L (ref 15–41)
Albumin: 3.4 g/dL — ABNORMAL LOW (ref 3.5–5.0)
Alkaline Phosphatase: 93 U/L (ref 38–126)
Anion gap: 10 (ref 5–15)
BUN: 13 mg/dL (ref 8–23)
CO2: 25 mmol/L (ref 22–32)
Calcium: 9.3 mg/dL (ref 8.9–10.3)
Chloride: 99 mmol/L (ref 98–111)
Creatinine: 0.75 mg/dL (ref 0.44–1.00)
GFR, Est AFR Am: >60 mL/min (ref >60)
GFR, Estimated: >60 mL/min (ref >60)
Glucose, Bld: 88 mg/dL (ref 70–99)
Potassium: 3.9 mmol/L (ref 3.5–5.1)
Sodium: 134 mmol/L — ABNORMAL LOW (ref 135–145)
Total Bilirubin: 0.5 mg/dL (ref 0.3–1.2)
Total Protein: 6.9 g/dL (ref 6.5–8.1)

## 2019-02-17 LAB — CBC WITH DIFFERENTIAL (CANCER CENTER ONLY)
Abs Immature Granulocytes: 0.03 10*3/uL (ref 0.00–0.07)
Basophils Absolute: 0 10*3/uL (ref 0.0–0.1)
Basophils Relative: 0 %
Eosinophils Absolute: 0.3 10*3/uL (ref 0.0–0.5)
Eosinophils Relative: 4 %
HCT: 37.5 % (ref 36.0–46.0)
Hemoglobin: 12.1 g/dL (ref 12.0–15.0)
Immature Granulocytes: 0 %
Lymphocytes Relative: 29 %
Lymphs Abs: 2.1 10*3/uL (ref 0.7–4.0)
MCH: 26 pg (ref 26.0–34.0)
MCHC: 32.3 g/dL (ref 30.0–36.0)
MCV: 80.5 fL (ref 80.0–100.0)
Monocytes Absolute: 0.6 10*3/uL (ref 0.1–1.0)
Monocytes Relative: 9 %
Neutro Abs: 4.1 10*3/uL (ref 1.7–7.7)
Neutrophils Relative %: 58 %
Platelet Count: 347 10*3/uL (ref 150–400)
RBC: 4.66 MIL/uL (ref 3.87–5.11)
RDW: 13.9 % (ref 11.5–15.5)
WBC Count: 7.1 10*3/uL (ref 4.0–10.5)
nRBC: 0 % (ref 0.0–0.2)

## 2019-02-17 LAB — MAGNESIUM: Magnesium: 2.1 mg/dL (ref 1.7–2.4)

## 2019-02-17 MED ORDER — SODIUM CHLORIDE 0.9% FLUSH
10.0000 mL | INTRAVENOUS | Status: DC | PRN
  Administered 2019-02-17: 10 mL
  Filled 2019-02-17: qty 10

## 2019-02-17 NOTE — Progress Notes (Signed)
Went to registration to introduce myself as Arboriculturist and to offer available resources.  Discussed one-time $65 [employer], [place] to assist with personal expenses while going through treatment. Based on verbal income, patient does qualify. Advised her what is needed to apply. She verbalized understanding.  She has my card for any additional financial questions or concerns.

## 2019-02-17 NOTE — Telephone Encounter (Signed)
Left the patient a msg to verify info for a telephone visit for pre reg

## 2019-02-18 ENCOUNTER — Inpatient Hospital Stay: Payer: Medicare HMO

## 2019-02-18 ENCOUNTER — Ambulatory Visit
Admission: RE | Admit: 2019-02-18 | Discharge: 2019-02-18 | Disposition: A | Discharge status: Home / Self Care | Payer: Medicare HMO | Source: Ambulatory Visit | Attending: Radiation Oncology | Admitting: Radiation Oncology

## 2019-02-18 ENCOUNTER — Telehealth: Payer: Self-pay | Admitting: Nutrition

## 2019-02-18 ENCOUNTER — Inpatient Hospital Stay: Payer: Medicare HMO | Admitting: Nutrition

## 2019-02-18 ENCOUNTER — Other Ambulatory Visit: Payer: Self-pay

## 2019-02-18 ENCOUNTER — Telehealth: Payer: Self-pay | Admitting: Hematology

## 2019-02-18 ENCOUNTER — Encounter: Payer: Self-pay | Admitting: Hematology

## 2019-02-18 VITALS — BP 120/63 | HR 72 | Temp 98.3°F | Resp 20

## 2019-02-18 DIAGNOSIS — Z510 Encounter for antineoplastic radiation therapy: Secondary | ICD-10-CM | POA: Diagnosis not present

## 2019-02-18 DIAGNOSIS — C778 Secondary and unspecified malignant neoplasm of lymph nodes of multiple regions: Secondary | ICD-10-CM | POA: Diagnosis not present

## 2019-02-18 DIAGNOSIS — R1310 Dysphagia, unspecified: Secondary | ICD-10-CM | POA: Diagnosis not present

## 2019-02-18 DIAGNOSIS — I700 Atherosclerosis of aorta: Secondary | ICD-10-CM | POA: Diagnosis not present

## 2019-02-18 DIAGNOSIS — R05 Cough: Secondary | ICD-10-CM | POA: Diagnosis not present

## 2019-02-18 DIAGNOSIS — C321 Malignant neoplasm of supraglottis: Secondary | ICD-10-CM

## 2019-02-18 DIAGNOSIS — J387 Other diseases of larynx: Secondary | ICD-10-CM | POA: Diagnosis not present

## 2019-02-18 MED ORDER — PALONOSETRON HCL INJECTION 0.25 MG/5ML
INTRAVENOUS | Status: AC
  Filled 2019-02-18: qty 5

## 2019-02-18 MED ORDER — SODIUM CHLORIDE 0.9% FLUSH
10.0000 mL | INTRAVENOUS | Status: DC | PRN
  Administered 2019-02-18: 10 mL
  Filled 2019-02-18: qty 10

## 2019-02-18 MED ORDER — PALONOSETRON HCL INJECTION 0.25 MG/5ML
0.2500 mg | Freq: Once | INTRAVENOUS | Status: AC
  Administered 2019-02-18: 11:00:00 0.25 mg via INTRAVENOUS

## 2019-02-18 MED ORDER — SODIUM CHLORIDE 0.9 % IV SOLN
Freq: Once | INTRAVENOUS | Status: AC
  Administered 2019-02-18: 08:00:00 via INTRAVENOUS
  Filled 2019-02-18: qty 250

## 2019-02-18 MED ORDER — SODIUM CHLORIDE 0.9 % IV SOLN
Freq: Once | INTRAVENOUS | Status: AC
  Administered 2019-02-18: 11:00:00 via INTRAVENOUS
  Filled 2019-02-18: qty 5

## 2019-02-18 MED ORDER — HEPARIN SOD (PORK) LOCK FLUSH 100 UNIT/ML IV SOLN
500.0000 [IU] | Freq: Once | INTRAVENOUS | Status: AC | PRN
  Administered 2019-02-18: 500 [IU]
  Filled 2019-02-18: qty 5

## 2019-02-18 MED ORDER — POTASSIUM CHLORIDE 2 MEQ/ML IV SOLN
Freq: Once | INTRAVENOUS | Status: AC
  Administered 2019-02-18: 08:00:00 via INTRAVENOUS
  Filled 2019-02-18: qty 10

## 2019-02-18 MED ORDER — SODIUM CHLORIDE 0.9 % IV SOLN
40.0000 mg/m2 | Freq: Once | INTRAVENOUS | Status: AC
  Administered 2019-02-18: 72 mg via INTRAVENOUS
  Filled 2019-02-18: qty 72

## 2019-02-18 NOTE — Progress Notes (Signed)
Nutrition follow-up completed with patient and her friend [place]. Weight is relatively stable and was documented as 160.5 pounds on May 27, decreased slightly from 161.9 pounds May 20. Patient reports she is eating pured foods but is not able to get them smooth enough to swallow easily. Patient is flushing her feeding tube with water.  She has been trying to increase her intake.  Nutrition diagnosis: Unintentional weight loss continues.  Intervention: Educated patient to continue strategies for increased calories and protein intake. Recommended increased fluids. Enforced importance of flushing her feeding tube. Contacted patient's friend [name]. She will need to call [employer] and talk to an RN case manager to see if [place] qualifies for additional pured meals through [employer].  Monitoring, evaluation, goals: Patient will work to increase calories and protein to minimize weight loss.  Next visit: Thursday, June 4.  **Disclaimer: This note was dictated with voice recognition software. Similar sounding words can inadvertently be transcribed and this note may contain transcription errors which may not have been corrected upon publication of note.**

## 2019-02-18 NOTE — Progress Notes (Signed)
Patient came in and brought proof of income for [place].  Patient approved for the one-time $700 [place] to assist with personal expenses while going through treatment. Gave her a copy of the approval letter as well as the expense sheet along with the Outpatient pharmacy information. Explained in detail how the expenses are covered and showed her the mailbox to drop expenses. She verbalized understanding.  She has my card for any additional financial questions or concerns and advised she may email expenses as well. She verbalized understanding.

## 2019-02-18 NOTE — Patient Instructions (Signed)
[facility] Discharge Instructions for Patients Receiving Chemotherapy  Today you received the following chemotherapy agents: Cisplatin  To help prevent nausea and vomiting after your treatment, we encourage you to take your nausea medication as directed.   If you develop nausea and vomiting that is not controlled by your nausea medication, call the clinic.   BELOW ARE SYMPTOMS THAT SHOULD BE REPORTED IMMEDIATELY:  *FEVER GREATER THAN 100.5 F  *CHILLS WITH OR WITHOUT FEVER  NAUSEA AND VOMITING THAT IS NOT CONTROLLED WITH YOUR NAUSEA MEDICATION  *UNUSUAL SHORTNESS OF BREATH  *UNUSUAL BRUISING OR BLEEDING  TENDERNESS IN MOUTH AND THROAT WITH OR WITHOUT PRESENCE OF ULCERS  *URINARY PROBLEMS  *BOWEL PROBLEMS  UNUSUAL RASH Items with * indicate a potential emergency and should be followed up as soon as possible.  Feel free to call the clinic should you have any questions or concerns. The clinic phone number is [phone].  Please show the [facility] at check-in to the Emergency Department and triage nurse.  Cisplatin injection What is this medicine? CISPLATIN (SIS pla tin) is a chemotherapy drug. It targets fast dividing cells, like cancer cells, and causes these cells to die. This medicine is used to treat many types of cancer like bladder, ovarian, and testicular cancers. This medicine may be used for other purposes; ask your health care provider or pharmacist if you have questions. COMMON BRAND NAME(S): Platinol, Platinol -AQ What should I tell my health care provider before I take this medicine? They need to know if you have any of these conditions: -blood disorders -hearing problems -kidney disease -recent or ongoing radiation therapy -an unusual or allergic reaction to cisplatin, carboplatin, other chemotherapy, other medicines, foods, dyes, or preservatives -pregnant or trying to get pregnant -breast-feeding How should I use this medicine?  This drug is given as an infusion into a vein. It is administered in a hospital or clinic by a specially trained health care professional. Talk to your pediatrician regarding the use of this medicine in children. Special care may be needed. Overdosage: If you think you have taken too much of this medicine contact a poison control center or emergency room at once. NOTE: This medicine is only for you. Do not share this medicine with others. What if I miss a dose? It is important not to miss a dose. Call your doctor or health care professional if you are unable to keep an appointment. What may interact with this medicine? -dofetilide -foscarnet -medicines for seizures -medicines to increase blood counts like filgrastim, pegfilgrastim, sargramostim -probenecid -pyridoxine used with altretamine -rituximab -some antibiotics like amikacin, gentamicin, neomycin, polymyxin B, streptomycin, tobramycin -sulfinpyrazone -vaccines -zalcitabine Talk to your doctor or health care professional before taking any of these medicines: -acetaminophen -aspirin -ibuprofen -ketoprofen -naproxen This list may not describe all possible interactions. Give your health care provider a list of all the medicines, herbs, non-prescription drugs, or dietary supplements you use. Also tell them if you smoke, drink alcohol, or use illegal drugs. Some items may interact with your medicine. What should I watch for while using this medicine? Your condition will be monitored carefully while you are receiving this medicine. You will need important blood work done while you are taking this medicine. This drug may make you feel generally unwell. This is not uncommon, as chemotherapy can affect healthy cells as well as cancer cells. Report any side effects. Continue your course of treatment even though you feel ill unless your doctor tells you to stop.  In some cases, you may be given additional medicines to help with side effects.  Follow all directions for their use. Call your doctor or health care professional for advice if you get a fever, chills or sore throat, or other symptoms of a cold or flu. Do not treat yourself. This drug decreases your body's ability to fight infections. Try to avoid being around people who are sick. This medicine may increase your risk to bruise or bleed. Call your doctor or health care professional if you notice any unusual bleeding. Be careful brushing and flossing your teeth or using a toothpick because you may get an infection or bleed more easily. If you have any dental work done, tell your dentist you are receiving this medicine. Avoid taking products that contain aspirin, acetaminophen, ibuprofen, naproxen, or ketoprofen unless instructed by your doctor. These medicines may hide a fever. Do not become pregnant while taking this medicine. Women should inform their doctor if they wish to become pregnant or think they might be pregnant. There is a potential for serious side effects to an unborn child. Talk to your health care professional or pharmacist for more information. Do not breast-feed an infant while taking this medicine. Drink fluids as directed while you are taking this medicine. This will help protect your kidneys. Call your doctor or health care professional if you get diarrhea. Do not treat yourself. What side effects may I notice from receiving this medicine? Side effects that you should report to your doctor or health care professional as soon as possible: -allergic reactions like skin rash, itching or hives, swelling of the face, lips, or tongue -signs of infection - fever or chills, cough, sore throat, pain or difficulty passing urine -signs of decreased platelets or bleeding - bruising, pinpoint red spots on the skin, black, tarry stools, nosebleeds -signs of decreased red blood cells - unusually weak or tired, fainting spells, lightheadedness -breathing problems -changes in  hearing -gout pain -low blood counts - This drug may decrease the number of white blood cells, red blood cells and platelets. You may be at increased risk for infections and bleeding. -nausea and vomiting -pain, swelling, redness or irritation at the injection site -pain, tingling, numbness in the hands or feet -problems with balance, movement -trouble passing urine or change in the amount of urine Side effects that usually do not require medical attention (report to your doctor or health care professional if they continue or are bothersome): -changes in vision -loss of appetite -metallic taste in the mouth or changes in taste This list may not describe all possible side effects. Call your doctor for medical advice about side effects. You may report side effects to FDA at 1-800-FDA-1088. Where should I keep my medicine? This drug is given in a hospital or clinic and will not be stored at home. NOTE: This sheet is a summary. It may not cover all possible information. If you have questions about this medicine, talk to your doctor, pharmacist, or health care provider.  2019 Elsevier/Gold Standard (2007-12-15 14:40:54)   PEG Tube Home Guide A PEG tube is used to put food and fluids into the stomach. Before you leave the hospital, make sure that you know:  How to care for your PEG tube.  How to care for the opening (stoma) in your belly.  How to give yourself a feeding.  How to give yourself medicines.  When to call your doctor for help. Supplies needed:  [employer], plain water  Clean  washcloth  Gauze pad  Syringe How to care for a PEG tube Check your PEG tube every day. Make sure:  It is not too tight.  It is in the correct place. There is a mark on the tube that shows when the tube is in the correct place. Adjust the tube if you need to. Cleaning your stoma Clean your stoma every day. Follow these steps: 1. Wash your hands with soap and water. If soap and  water are not available, use hand sanitizer. 2. Check your stoma. Let your doctor know if there is: ? Redness. ? Leaking. ? Skin irritation. 3. Wash the stoma gently with warm, soapy water. 4. Rinse the stoma with plain water. 5. Pat the stoma area dry. 6. You may place a gauze pad around the opening of your stoma.  Giving a feeding Your doctor will tell you:  How much nutrition and fluid you will need for each feeding.  How often to have a feeding.  Whether you should take medicine in the tube by itself or with a feeding. To give yourself a feeding, follow these steps: 1. Lay out all of the things that you will need. 2. Make sure that the nutritional formula is at room temperature. 3. Wash your hands with soap and water. 4. Sit up or stand up straight. You will need to stay sitting up or standing up while you give yourself a feeding. 5. Make sure the syringe plunger is pushed in. Place the tip of the syringe in clean water, and slowly pull the plunger to bring (draw up) the water into the syringe. 6. Remove the clamp and the cap from the PEG tube. 7. Push the water out of the syringe to clean (flush) the tube. 8. If the tube is clear, draw up the formula into the syringe. Make sure to use the right amount for each feeding. Add water if you need to. 9. Slowly push the formula from the syringe through the tube. 10. After the feeding, flush the tube with water. 11. Put the clamp and the cap on the tube. 12. Stay sitting up or standing up straight for at least 30 minutes. Giving medicine To give yourself medicine, follow these steps: 1. Lay out all of the things that you will need. 2. If your medicine is in tablet form, crush it and dissolve it in water. 3. Wash your hands with soap and water. 4. Sit up or stand up straight. You will need to stay sitting up or standing up while you give yourself medicine. 5. Make sure the syringe plunger is pushed in. Place the tip of the syringe in  clean water, and slowly pull the plunger to bring (draw up) the water into the syringe. 6. Remove the clamp and the cap from the PEG tube. 7. Push the water out of the syringe to clean (flush) the tube. 8. If the tube is clear, draw up the medicine into the syringe. 9. Slowly push the medicine from the syringe through the tube. 10. Flush the tube with water. 11. Put the clamp and the cap on the tube. 12. Stay sitting up or standing up straight for at least 30 minutes. Do not take sustained release (SR) medicines through your tube. If you are not sure if your medicine is a SR medicine, ask your doctor. Contact a doctor if:  The area around your stoma is sore, irritated, or red.  You have belly pain or bloating while you are feeding, or  after you feed.  You feel sick to your stomach (nausea) for a long time.  You cannot poop (constipated) or you have watery poop (diarrhea) for a long time.  You have a fever.  You have problems with your PEG tube. Get help right away if:  Your tube is blocked.  Your tube falls out.  You have pain around your tube.  You are bleeding from your tube.  Your tube is leaking.  You choke or you have trouble breathing while you are feeding or after you feed. Summary  A PEG tube is used to put food and fluids into the stomach.  Before you leave the hospital, you will be taught how to use and care for your PEG tube.  Your doctor will give you instructions on how to give yourself feedings and medicines.  Contact your doctor if you have fever or soreness, redness, or irritation around your stoma.  Get help right away if your tube leaks, is blocked, or falls out. Also, get help right away if you have pain or bleeding around your stoma. This information is not intended to replace advice given to you by your health care provider. Make sure you discuss any questions you have with your health care provider. Document Released: 10/12/2010 Document Revised:  09/22/2017 Document Reviewed: 09/22/2017 Elsevier Interactive Patient Education  2019 [employer].

## 2019-02-18 NOTE — Telephone Encounter (Signed)
No change in appts per 5/27 los.

## 2019-02-18 NOTE — Telephone Encounter (Signed)
Contacted Cindy, patient's friend, with information about [employer].  Patient needs to contact an RN case Freight forwarder with [employer] to order additional pured foods.

## 2019-02-19 ENCOUNTER — Other Ambulatory Visit: Payer: Self-pay

## 2019-02-19 ENCOUNTER — Ambulatory Visit
Admission: RE | Admit: 2019-02-19 | Discharge: 2019-02-19 | Disposition: A | Discharge status: Home / Self Care | Payer: Medicare HMO | Source: Ambulatory Visit | Attending: Radiation Oncology | Admitting: Radiation Oncology

## 2019-02-19 ENCOUNTER — Other Ambulatory Visit: Payer: Self-pay | Admitting: Radiation Oncology

## 2019-02-19 DIAGNOSIS — Z510 Encounter for antineoplastic radiation therapy: Secondary | ICD-10-CM | POA: Diagnosis not present

## 2019-02-19 DIAGNOSIS — C321 Malignant neoplasm of supraglottis: Secondary | ICD-10-CM | POA: Diagnosis not present

## 2019-02-20 DIAGNOSIS — C321 Malignant neoplasm of supraglottis: Secondary | ICD-10-CM | POA: Diagnosis not present

## 2019-02-20 DIAGNOSIS — Z930 Tracheostomy status: Secondary | ICD-10-CM | POA: Diagnosis not present

## 2019-02-20 DIAGNOSIS — Z431 Encounter for attention to gastrostomy: Secondary | ICD-10-CM | POA: Diagnosis not present

## 2019-02-20 DIAGNOSIS — Z87891 Personal history of nicotine dependence: Secondary | ICD-10-CM | POA: Diagnosis not present

## 2019-02-22 ENCOUNTER — Ambulatory Visit
Admission: RE | Admit: 2019-02-22 | Discharge: 2019-02-22 | Disposition: A | Discharge status: Home / Self Care | Payer: Medicare HMO | Source: Ambulatory Visit | Attending: Radiation Oncology | Admitting: Radiation Oncology

## 2019-02-22 ENCOUNTER — Other Ambulatory Visit: Payer: Self-pay

## 2019-02-22 ENCOUNTER — Other Ambulatory Visit: Payer: Self-pay | Admitting: Radiation Oncology

## 2019-02-22 DIAGNOSIS — Z510 Encounter for antineoplastic radiation therapy: Secondary | ICD-10-CM | POA: Insufficient documentation

## 2019-02-22 DIAGNOSIS — C321 Malignant neoplasm of supraglottis: Secondary | ICD-10-CM | POA: Diagnosis not present

## 2019-02-22 DIAGNOSIS — E785 Hyperlipidemia, unspecified: Secondary | ICD-10-CM | POA: Diagnosis not present

## 2019-02-22 DIAGNOSIS — Z1331 Encounter for screening for depression: Secondary | ICD-10-CM | POA: Diagnosis not present

## 2019-02-22 DIAGNOSIS — Z9181 History of falling: Secondary | ICD-10-CM | POA: Diagnosis not present

## 2019-02-22 DIAGNOSIS — Z0000 Encounter for general adult medical examination without abnormal findings: Secondary | ICD-10-CM | POA: Diagnosis not present

## 2019-02-23 ENCOUNTER — Encounter: Payer: Self-pay | Admitting: Nutrition

## 2019-02-23 ENCOUNTER — Telehealth: Payer: Self-pay | Admitting: Nutrition

## 2019-02-23 ENCOUNTER — Ambulatory Visit
Admission: RE | Admit: 2019-02-23 | Discharge: 2019-02-23 | Disposition: A | Discharge status: Home / Self Care | Payer: Medicare HMO | Source: Ambulatory Visit | Attending: Radiation Oncology | Admitting: Radiation Oncology

## 2019-02-23 ENCOUNTER — Other Ambulatory Visit: Payer: Self-pay

## 2019-02-23 DIAGNOSIS — R0602 Shortness of breath: Secondary | ICD-10-CM | POA: Diagnosis not present

## 2019-02-23 DIAGNOSIS — Z85819 Personal history of malignant neoplasm of unspecified site of lip, oral cavity, and pharynx: Secondary | ICD-10-CM | POA: Diagnosis not present

## 2019-02-23 DIAGNOSIS — Z510 Encounter for antineoplastic radiation therapy: Secondary | ICD-10-CM | POA: Diagnosis not present

## 2019-02-23 DIAGNOSIS — R079 Chest pain, unspecified: Secondary | ICD-10-CM | POA: Diagnosis not present

## 2019-02-23 DIAGNOSIS — Z87891 Personal history of nicotine dependence: Secondary | ICD-10-CM | POA: Diagnosis not present

## 2019-02-23 DIAGNOSIS — J9503 Malfunction of tracheostomy stoma: Secondary | ICD-10-CM | POA: Diagnosis not present

## 2019-02-23 DIAGNOSIS — C159 Malignant neoplasm of esophagus, unspecified: Secondary | ICD-10-CM | POA: Diagnosis not present

## 2019-02-23 DIAGNOSIS — J9509 Other tracheostomy complication: Secondary | ICD-10-CM | POA: Diagnosis not present

## 2019-02-23 DIAGNOSIS — I21A1 Myocardial infarction type 2: Secondary | ICD-10-CM | POA: Diagnosis not present

## 2019-02-23 DIAGNOSIS — C329 Malignant neoplasm of larynx, unspecified: Secondary | ICD-10-CM | POA: Diagnosis not present

## 2019-02-23 DIAGNOSIS — R042 Hemoptysis: Secondary | ICD-10-CM | POA: Diagnosis not present

## 2019-02-23 DIAGNOSIS — Z930 Tracheostomy status: Secondary | ICD-10-CM | POA: Diagnosis not present

## 2019-02-23 DIAGNOSIS — R1310 Dysphagia, unspecified: Secondary | ICD-10-CM | POA: Diagnosis not present

## 2019-02-23 DIAGNOSIS — Z931 Gastrostomy status: Secondary | ICD-10-CM | POA: Diagnosis not present

## 2019-02-23 DIAGNOSIS — E039 Hypothyroidism, unspecified: Secondary | ICD-10-CM | POA: Diagnosis not present

## 2019-02-23 DIAGNOSIS — Z20828 Contact with and (suspected) exposure to other viral communicable diseases: Secondary | ICD-10-CM | POA: Diagnosis not present

## 2019-02-23 DIAGNOSIS — J387 Other diseases of larynx: Secondary | ICD-10-CM | POA: Diagnosis not present

## 2019-02-23 DIAGNOSIS — Y838 Other surgical procedures as the cause of abnormal reaction of the patient, or of later complication, without mention of misadventure at the time of the procedure: Secondary | ICD-10-CM | POA: Diagnosis not present

## 2019-02-23 DIAGNOSIS — R509 Fever, unspecified: Secondary | ICD-10-CM | POA: Diagnosis not present

## 2019-02-23 DIAGNOSIS — C321 Malignant neoplasm of supraglottis: Secondary | ICD-10-CM | POA: Diagnosis not present

## 2019-02-23 DIAGNOSIS — J9601 Acute respiratory failure with hypoxia: Secondary | ICD-10-CM | POA: Diagnosis not present

## 2019-02-23 NOTE — Telephone Encounter (Signed)
Contacted [name] [name] to notify her that we were unable to connect with [employer] to order patient pured meals.  RN spent approximately 1 hour on the phone and was unable to connect to order patient meals.  I have asked [name] [name] to contact them and request a form that could be faxed to them with MD signature.

## 2019-02-23 NOTE — Progress Notes (Signed)
[name] [name] contacted me and requested I have a nurse or Dr. Maylon Peppers contact [employer].  She reports [employer] requires MD request for pured foods for [place].  I contacted RN today who agrees to call [employer] at [phone].  She is to give them patient's ID number which is [id number].  [name] [name] reports Medicare pays for pured foods.  RN agrees and will contact them.

## 2019-02-24 ENCOUNTER — Telehealth: Payer: Self-pay | Admitting: Nutrition

## 2019-02-24 ENCOUNTER — Inpatient Hospital Stay: Payer: Medicare HMO

## 2019-02-24 ENCOUNTER — Inpatient Hospital Stay: Payer: Medicare HMO | Admitting: Hematology

## 2019-02-24 ENCOUNTER — Telehealth: Payer: Self-pay | Admitting: *Deleted

## 2019-02-24 ENCOUNTER — Ambulatory Visit: Payer: Medicare HMO

## 2019-02-24 DIAGNOSIS — R042 Hemoptysis: Secondary | ICD-10-CM | POA: Diagnosis not present

## 2019-02-24 MED ORDER — LACTATED RINGERS IV SOLN
75.00 | INTRAVENOUS | Status: DC
Start: ? — End: 2019-02-24

## 2019-02-24 MED ORDER — ACETAMINOPHEN 325 MG PO TABS
650.00 | ORAL_TABLET | ORAL | Status: DC
Start: ? — End: 2019-02-24

## 2019-02-24 MED ORDER — LEVOTHYROXINE SODIUM 50 MCG PO TABS
175.00 | ORAL_TABLET | ORAL | Status: DC
Start: 2019-02-25 — End: 2019-02-24

## 2019-02-24 MED ORDER — SENNA 8.8 MG/5ML PO SYRP
5.00 | ORAL_SOLUTION | ORAL | Status: DC
Start: 2019-02-25 — End: 2019-02-24

## 2019-02-24 MED ORDER — TOBRAMYCIN 300 MG/5ML IN NEBU
300.00 | INHALATION_SOLUTION | RESPIRATORY_TRACT | Status: DC
Start: 2019-02-25 — End: 2019-02-24

## 2019-02-24 MED ORDER — FAMOTIDINE 20 MG PO TABS
20.00 | ORAL_TABLET | ORAL | Status: DC
Start: 2019-02-24 — End: 2019-02-24

## 2019-02-24 MED ORDER — GENERIC EXTERNAL MEDICATION
3.00 | Status: DC
Start: 2019-02-25 — End: 2019-02-24

## 2019-02-24 MED ORDER — LEVOTHYROXINE SODIUM 50 MCG PO TABS
150.00 | ORAL_TABLET | ORAL | Status: DC
Start: ? — End: 2019-02-24

## 2019-02-24 MED ORDER — FENOFIBRATE 145 MG PO TABS
145.00 | ORAL_TABLET | ORAL | Status: DC
Start: 2019-02-26 — End: 2019-02-24

## 2019-02-24 MED ORDER — DOCUSATE SODIUM 150 MG/15ML PO LIQD
100.00 | ORAL | Status: DC
Start: 2019-02-25 — End: 2019-02-24

## 2019-02-24 MED ORDER — DOCUSATE SODIUM 100 MG PO CAPS
100.00 | ORAL_CAPSULE | ORAL | Status: DC
Start: 2019-02-24 — End: 2019-02-24

## 2019-02-24 MED ORDER — FAMOTIDINE 20 MG PO TABS
20.00 | ORAL_TABLET | ORAL | Status: DC
Start: 2019-02-25 — End: 2019-02-24

## 2019-02-24 MED ORDER — HEPARIN SODIUM (PORCINE) 5000 UNIT/ML IJ SOLN
5000.00 | INTRAMUSCULAR | Status: DC
Start: 2019-02-25 — End: 2019-02-24

## 2019-02-24 MED ORDER — LEVOTHYROXINE SODIUM 50 MCG PO TABS
175.00 | ORAL_TABLET | ORAL | Status: DC
Start: 2019-02-27 — End: 2019-02-24

## 2019-02-24 MED ORDER — GENERIC EXTERNAL MEDICATION
2.00 | Status: DC
Start: 2019-02-24 — End: 2019-02-24

## 2019-02-24 MED ORDER — LEVOTHYROXINE SODIUM 50 MCG PO TABS
150.00 | ORAL_TABLET | ORAL | Status: DC
Start: 2019-02-26 — End: 2019-02-24

## 2019-02-24 MED ORDER — EZETIMIBE 10 MG PO TABS
10.00 | ORAL_TABLET | ORAL | Status: DC
Start: 2019-02-24 — End: 2019-02-24

## 2019-02-24 MED ORDER — ONDANSETRON HCL 4 MG/2ML IJ SOLN
4.00 | INTRAMUSCULAR | Status: DC
Start: ? — End: 2019-02-24

## 2019-02-24 MED ORDER — EZETIMIBE 10 MG PO TABS
10.00 | ORAL_TABLET | ORAL | Status: DC
Start: 2019-02-25 — End: 2019-02-24

## 2019-02-24 NOTE — Telephone Encounter (Signed)
Left message to verify pt telephone visit for pre reg

## 2019-02-24 NOTE — Telephone Encounter (Signed)
TCT pt's partner, [name] as pt is hospitalized @ [facility].  Asking cindy what occurred yesterday. She sates that pt had blood clots in her trach causing trouble breathing.  [name] states she was able to get most of the clots out but not totally cleared. She took her to ED @ [facility] where they were able to clear rest of clots and then she was transported to [place] for admission. [name] states the oncologist there, Dr. Carron Brazen, has ordered more tests today though cindy was not sure if that will include any scans.  Asked [name] to call us later today to let us know how Janee is doing.   Dr. Maylon Peppers made aware of the above and will check Care Everywhere to also follow pt's progress @ [facility] hospital. Appts cancelled here today and likely chemo will be cancelled tomorrow

## 2019-02-25 ENCOUNTER — Inpatient Hospital Stay: Payer: Medicare HMO

## 2019-02-25 ENCOUNTER — Inpatient Hospital Stay: Payer: Medicare HMO | Admitting: Nutrition

## 2019-02-25 ENCOUNTER — Ambulatory Visit: Payer: Medicare HMO

## 2019-02-25 ENCOUNTER — Telehealth: Payer: Self-pay | Admitting: *Deleted

## 2019-02-25 DIAGNOSIS — R042 Hemoptysis: Secondary | ICD-10-CM | POA: Diagnosis not present

## 2019-02-25 DIAGNOSIS — J383 Other diseases of vocal cords: Secondary | ICD-10-CM | POA: Diagnosis not present

## 2019-02-25 DIAGNOSIS — Z430 Encounter for attention to tracheostomy: Secondary | ICD-10-CM | POA: Diagnosis not present

## 2019-02-25 DIAGNOSIS — J387 Other diseases of larynx: Secondary | ICD-10-CM | POA: Diagnosis not present

## 2019-02-25 NOTE — Telephone Encounter (Signed)
Thanks for letting me know. She should resume her radiation as scheduled, and we will plan to resume her treatment next week as scheduled if her labs are appropriate and she is clinically improving.   Dr. Maylon Peppers

## 2019-02-25 NOTE — Telephone Encounter (Signed)
TCT cindy moran and advised that pt can resume her radiation treatments tomorrow as scheduled and her appts for labs and Dr. Maylon Peppers next week as scheduled. Chemo will be as scheduled on Thursday as long as her labs are acceptable. Cindy voiced understanding.  She states [name] [name] is feeling much better.

## 2019-02-25 NOTE — Telephone Encounter (Signed)
Received vm message from pt's partner, cindy moran.  She states Deari was discharged from [facility] today and the oncologist there told her she can resume her chemotherapy treatments now.   Next treatment is scheduled for 03/04/19 but MD visit/labs the day before. Message sent to Dr. Maylon Peppers

## 2019-02-26 ENCOUNTER — Telehealth: Payer: Self-pay | Admitting: Nutrition

## 2019-02-26 ENCOUNTER — Other Ambulatory Visit: Payer: Self-pay

## 2019-02-26 ENCOUNTER — Telehealth: Payer: Self-pay | Admitting: *Deleted

## 2019-02-26 ENCOUNTER — Ambulatory Visit
Admission: RE | Admit: 2019-02-26 | Discharge: 2019-02-26 | Disposition: A | Discharge status: Home / Self Care | Payer: Medicare HMO | Source: Ambulatory Visit | Attending: Radiation Oncology | Admitting: Radiation Oncology

## 2019-02-26 DIAGNOSIS — Z431 Encounter for attention to gastrostomy: Secondary | ICD-10-CM | POA: Diagnosis not present

## 2019-02-26 DIAGNOSIS — Z87891 Personal history of nicotine dependence: Secondary | ICD-10-CM | POA: Diagnosis not present

## 2019-02-26 DIAGNOSIS — C321 Malignant neoplasm of supraglottis: Secondary | ICD-10-CM | POA: Diagnosis not present

## 2019-02-26 DIAGNOSIS — Z930 Tracheostomy status: Secondary | ICD-10-CM | POA: Diagnosis not present

## 2019-02-26 DIAGNOSIS — Z510 Encounter for antineoplastic radiation therapy: Secondary | ICD-10-CM | POA: Diagnosis not present

## 2019-02-26 NOTE — Telephone Encounter (Signed)
Contacted pt to verify telephone visit for pre reg

## 2019-02-27 NOTE — Telephone Encounter (Signed)
Oncology Nurse Navigator Documentation  Received 1050 VMM from pt's SO Cindy asking for PEG nutritiional guidance.  Following conversation with Langley Adie, RD, called [name] with the following instruction:  If eating orally, 1 box Osmolite 1.5 BID with 60 cc water flush before/after instillation.   If no PO intake, 1 box Osmolite 1.5 at 8:00 am, Noon, 4:00 pm, 8:00 pm with 60 cc water flush before/after instillation. Cindy explained:  [facility] physician directed liquids only, no solid/pureed foods.  Paislynn is drinking 2-3 Boost, Gatorade, and water daily.   Further noted [facility] physician explained throat clots r/t RT, not unexpected. I informed her Raford Pitcher will give them a call on Monday with further guidance.   [name] voiced understanding of information provided, expressed appreciation for follow-up.  Gayleen Orem, RN, BSN Head & Neck Oncology Nurse [place] at [place] [phone]

## 2019-03-01 ENCOUNTER — Inpatient Hospital Stay: Payer: Medicare HMO | Attending: Hematology | Admitting: Nutrition

## 2019-03-01 ENCOUNTER — Ambulatory Visit
Admission: RE | Admit: 2019-03-01 | Discharge: 2019-03-01 | Disposition: A | Discharge status: Home / Self Care | Payer: Medicare HMO | Source: Ambulatory Visit | Attending: Radiation Oncology | Admitting: Radiation Oncology

## 2019-03-01 ENCOUNTER — Other Ambulatory Visit: Payer: Self-pay

## 2019-03-01 DIAGNOSIS — R042 Hemoptysis: Secondary | ICD-10-CM | POA: Insufficient documentation

## 2019-03-01 DIAGNOSIS — K8020 Calculus of gallbladder without cholecystitis without obstruction: Secondary | ICD-10-CM | POA: Insufficient documentation

## 2019-03-01 DIAGNOSIS — K5900 Constipation, unspecified: Secondary | ICD-10-CM | POA: Insufficient documentation

## 2019-03-01 DIAGNOSIS — F419 Anxiety disorder, unspecified: Secondary | ICD-10-CM | POA: Insufficient documentation

## 2019-03-01 DIAGNOSIS — R109 Unspecified abdominal pain: Secondary | ICD-10-CM | POA: Insufficient documentation

## 2019-03-01 DIAGNOSIS — C321 Malignant neoplasm of supraglottis: Secondary | ICD-10-CM | POA: Insufficient documentation

## 2019-03-01 DIAGNOSIS — Z5111 Encounter for antineoplastic chemotherapy: Secondary | ICD-10-CM | POA: Insufficient documentation

## 2019-03-01 DIAGNOSIS — Z430 Encounter for attention to tracheostomy: Secondary | ICD-10-CM | POA: Diagnosis not present

## 2019-03-01 DIAGNOSIS — T451X5A Adverse effect of antineoplastic and immunosuppressive drugs, initial encounter: Secondary | ICD-10-CM | POA: Insufficient documentation

## 2019-03-01 DIAGNOSIS — Z510 Encounter for antineoplastic radiation therapy: Secondary | ICD-10-CM | POA: Diagnosis not present

## 2019-03-01 DIAGNOSIS — Z79899 Other long term (current) drug therapy: Secondary | ICD-10-CM | POA: Insufficient documentation

## 2019-03-01 DIAGNOSIS — D6481 Anemia due to antineoplastic chemotherapy: Secondary | ICD-10-CM | POA: Insufficient documentation

## 2019-03-01 DIAGNOSIS — K3189 Other diseases of stomach and duodenum: Secondary | ICD-10-CM | POA: Insufficient documentation

## 2019-03-01 DIAGNOSIS — D701 Agranulocytosis secondary to cancer chemotherapy: Secondary | ICD-10-CM | POA: Insufficient documentation

## 2019-03-01 DIAGNOSIS — I700 Atherosclerosis of aorta: Secondary | ICD-10-CM | POA: Insufficient documentation

## 2019-03-01 DIAGNOSIS — E46 Unspecified protein-calorie malnutrition: Secondary | ICD-10-CM | POA: Insufficient documentation

## 2019-03-01 DIAGNOSIS — J0410 Acute tracheitis without obstruction: Secondary | ICD-10-CM | POA: Insufficient documentation

## 2019-03-01 NOTE — Progress Notes (Signed)
[facility] OFFICE PROGRESS NOTE  Patient Care Team: Philmore Pali, NP as PCP - General (Nurse Practitioner) Eppie Gibson, MD as Attending Physician (Radiation Oncology) Tish Men, MD as Consulting Physician (Hematology) Leota Sauers, RN as Oncology Nurse Navigator Karie Mainland, RD as Dietitian (Nutrition) Sharen Counter, CCC-SLP as Speech Language Pathologist (Speech Pathology)  HEME/ONC OVERVIEW: 1.  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 Suspicious <1cm bilateral Level II cervical LN's.   Transferred to [facility] for airway compromise and taken to OR for tracheostomy, bx of the supraglottic lesion did not show any malignancy  Bx of the left cervical LN was non-diagnostic -12/2018: repeat DL with bx of the supraglottic mass showed well-differentiated invasive squamous cell carcinoma  -01/2019: PET showed locally advanced FDG-avid laryngeal neoplasm with bilateral cervical LN involvement as well as mildly FDG-avid mediastinal LN's, concerning for metastatic disease; EUS w/ bx of the mediastinal LN negative for malignancy -Mid-01/2019 - present: definitive chemoradiation with weekly cisplatin   2. Port and PEG in 01/2019   TREATMENT REGIMEN:  12/03/2018: tracheostomy, DL and bx at 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 -S/p 2 cycles of weekly cisplatin concurrent with RT in the definitive setting -Cycle 3 delayed by one week due to hospitalization for bleeding from tracheostomy -Labs adequate, proceed with Cycle 3 of chemotherapy -PRN anti-emetics: Zofran, Compazine, Ativan and dexamethasone    Chemotherapy-associated anemia -Secondary to chemotherapy -Hgb 11.3, slightly lower than last week  -Patient denies any significant bleeding except scant blood in the sputum -We will monitor for now; no indication for dose adjustment -If anemia worsens in the future, we will consider delaying chemotherapy or adjusting chemotherapy dose  Protein malnutrition -Secondary to chemotherapy vs. chemoradiation -Weight down by 2 lbs since the last visit  -She is currently taking 1 can of Osmolite per day via the feeding tube as well as food pureed in a blender  -Given the weight loss, I have instructed the patient to increase her tube feeding to 2-3 cans of Osmolite per day  -I encouraged the patient to maintain adequate PO intake as tolerated, and to adhere to nutritional recommendations   Tracheitis  -Patient was recently hospitalized at [facility] for bleeding from the tracheostomy; trach exchanged -Clinically, patient is improving and has only scant blood in the sputum -Continue Augment x 10 days and follow-up with Dr. Nicolette Bang of ENT   Abdominal pain -Secondary to PEG tube placement -Pain overall improving and patient has not required morphine -Continue IR liquid morphine 31m/0.5mL q4hrs PRN  Anxiety -Patient takes 1 dose of Ativan prior to each radiation treatment due to anxiety -I counseled the patient on the potential dependence of benzodiazepines, and cautioned her to take it only as needed  No orders of the defined types were placed in this encounter.  All questions were answered. The patient knows to call the clinic with any problems, questions or concerns. No barriers to learning was detected.  Return in 1 week for labs, port flush, clinic appt and Dose #4 of chemotherapy.   YTish Men MD 03/03/2019 10:36 AM  CHIEF COMPLAINT: "I am doing better"  INTERVAL HISTORY: Ms.  Clink squamous cell carcinoma of the supraglottic larynx on definitive chemoradiation.  Patient was recently  hospitalized at [facility] for bleeding from the tracheostomy, and was diagnosed with tracheitis.  Her tracheostomy was exchanged, and she was discharged home with Augmentin x10 days, which she still has 4 more days remaining.  Since discharge, she has been doing much better, and has not had any significant recurrent bleeding from the tracheostomy except occasional scant blood in the secretions.  She uses saline drops in her tracheostomy to help loosen the secretions.  She is currently taking Osmolite 1 cans/day, as well as eating scrambled eggs, chicken, and potatoes after blending it in the blender.  She is also drinking 6-8 bottles of 8 ounce water per day.  Her abdominal pain from recent feeding tube placement has mostly resolved, and she has not had to take any pain medication.  She denies any other complaint today.  SUMMARY OF ONCOLOGIC HISTORY:   Malignant neoplasm of supraglottis ([place])   12/02/2018 Imaging    CT neck: IMPRESSION: 1. Supraglottic laryngeal carcinoma extending across the anterior commissure and invading the prelaryngeal fat, left aryepiglottic fold and left aspect of the epiglottis. 2. Severe narrowing of the laryngeal airway. 3. Bilateral subcentimeter level 2A and 2A cervical lymph nodes.  Critical Value/emergent results were called by telephone at the time of interpretation on 12/02/2018 at 5:34 pm to Dr. Collier Salina, who verbally acknowledged these results.    12/02/2018 Imaging    CT  Chest:  IMPRESSION: No evidence of metastatic disease in the chest.    12/03/2018 Pathology Results    (Care Everywhere; [facility]) A: Larynx, supraglottis, biopsy - Atypical squamous proliferation with at least low grade dysplasia and abundant keratinization - No definite high grade dysplasia or invasive carcinoma represented (see comment)    12/18/2018 Pathology Results    (Care Everywhere; [facility]) A:  Neck, left, fine needle aspiration - Rare single atypical cells, no cells  diagnostic of malignancy - Lymph node material present  B:  Neck, right, fine needle aspiration - Non-diagnostic specimen - No lymph node material identified - Mixed inflammation and blood    12/31/2018 Pathology Results    (Care Everywhere; [facility])  A: Larynx, left supraglottic mass, biopsy - Atypical squamous proliferation with abundant keratinization, suspicious for well-differentiated invasive squamous cell carcinoma  B: Larynx, left supraglottic mass, biopsy - Well-differentiated invasive squamous cell carcinoma with abundant keratinization (see comment)    01/18/2019 Initial Diagnosis    Laryngeal cancer ([place])    01/26/2019 Imaging    PET: IMPRESSION: 1. Locally advanced hypermetabolic laryngeal neoplasm centered in the left supraglottic space with bilateral glottic and epiglottic involvement and subglottic extension on the left. 2. Hypermetabolic bilateral level 3 neck nodal metastases. 3. Hypermetabolic high mediastinal nodal metastases between the trachea and upper thoracic esophagus. 4. Otherwise no distant hypermetabolic metastatic disease. 5. Nodular 7 mm focus at the left lung base, below PET resolution, recommend attention on follow-up chest CT in 3 months. 6. Chronic findings include: Aortic Atherosclerosis (ICD10-I70.0). Cholelithiasis.    02/08/2019 Cancer Staging    Staging form: Larynx - Supraglottis, AJCC 8th Edition - Clinical stage from 02/08/2019: Stage IVA (cT3, cN2c, cM0) - Signed by Eppie Gibson, MD on 02/08/2019    02/11/2019 -  Chemotherapy    The patient had palonosetron (ALOXI) injection 0.25 mg, 0.25 mg, Intravenous,  Once, 2 of 7 cycles Administration: 0.25 mg (02/11/2019), 0.25 mg (02/18/2019) CISplatin (PLATINOL) 72 mg in sodium chloride 0.9 %  250 mL chemo infusion, 40 mg/m2 = 72 mg, Intravenous,  Once, 2 of 7 cycles Administration: 72 mg (02/11/2019), 72 mg (02/18/2019) fosaprepitant (EMEND) 150 mg, dexamethasone (DECADRON) 12 mg in sodium  chloride 0.9 % 145 mL IVPB, , Intravenous,  Once, 2 of 7 cycles Administration:  (02/11/2019),  (02/18/2019)  for chemotherapy treatment.      REVIEW OF SYSTEMS:   Constitutional: ( - ) fevers, ( - )  chills , ( - ) night sweats Eyes: ( - ) blurriness of vision, ( - ) double vision, ( - ) watery eyes Ears, nose, mouth, throat, and face: ( - ) mucositis, ( - ) sore throat Respiratory: ( + ) cough, ( - ) dyspnea, ( - ) wheezes Cardiovascular: ( - ) palpitation, ( - ) chest discomfort, ( - ) lower extremity swelling Gastrointestinal:  ( - ) nausea, ( - ) heartburn, ( - ) change in bowel habits Skin: ( - ) abnormal skin rashes Lymphatics: ( - ) new lymphadenopathy, ( - ) easy bruising Neurological: ( - ) numbness, ( - ) tingling, ( - ) new weaknesses Behavioral/Psych: ( - ) mood change, ( - ) new changes  All other systems were reviewed with the patient and are negative.  I have reviewed the past medical history, past surgical history, social history and family history with the patient and they are unchanged from previous note.  ALLERGIES:  has No Known Allergies.  MEDICATIONS:  Current Outpatient Medications  Medication Sig Dispense Refill  . amoxicillin-clavulanate (AUGMENTIN) 250-62.5 MG/5ML suspension Take by mouth.    . chlorhexidine (PERIDEX) 0.12 % solution Rinse with 15 mls twice daily for 30 seconds. Use after breakfast and at bedtime. Spit out excess. Do not swallow. (Patient taking differently: Use as directed 15 mLs in the mouth or throat 2 (two) times daily. Rinse with 15 mls twice daily for 30 seconds. Use after breakfast and at bedtime. Spit out excess. Do not swallow.) 480 mL prn  . dexamethasone (DECADRON) 4 MG tablet Take 2 tablets by mouth once a day on the day after chemotherapy and then take 2 tablets two times a day for 2 days. Take with food. 30 tablet 1  . docusate sodium (COLACE) 100 MG capsule Take 100 mg by mouth 2 (two) times daily.    Marland Kitchen HYDROcodone-acetaminophen  (HYCET) 7.5-325 mg/15 ml solution Take 15 mLs by mouth 4 (four) times daily as needed for moderate pain. 120 mL 0  . levothyroxine (SYNTHROID) 150 MCG tablet Take 150 mcg by mouth every other day. Alternating with 175 mcg dose    . levothyroxine (SYNTHROID) 175 MCG tablet Take 175 mcg by mouth every other day. Alternating with 150 mcg dose    . lidocaine (XYLOCAINE) 2 % solution Patient: Mix 1part 2% viscous lidocaine, 1part H20. Swallow 62m of diluted mixture, 329m before meals and at bedtime, up to QID prn soreness 100 mL 5  . lidocaine-prilocaine (EMLA) cream Apply to affected area once 30 g 3  . LORazepam (ATIVAN) 0.5 MG tablet Take 1 tablet (0.5 mg total) by mouth every 6 (six) hours as needed (Nausea or vomiting). 30 tablet 0  . Morphine Sulfate (MORPHINE CONCENTRATE) 10 mg / 0.5 ml concentrated solution Take 0.5 mLs (10 mg total) by mouth every 6 (six) hours as needed for up to 30 days for severe pain. 120 mL 0  . ondansetron (ZOFRAN) 8 MG tablet Take 1 tablet (8 mg total) by mouth 2 (two) times daily as  needed. Start on the third day after chemotherapy. 30 tablet 1  . prochlorperazine (COMPAZINE) 10 MG tablet Take 1 tablet (10 mg total) by mouth every 6 (six) hours as needed (Nausea or vomiting). 30 tablet 1  . sodium fluoride (PREVIDENT 5000 PLUS) 1.1 % CREA dental cream Apply cream to tooth brush. Brush teeth for 2 minutes. Spit out excess. DO NOT rinse afterwards. Repeat nightly. (Patient taking differently: Place 1 application onto teeth 2 (two) times a day. ) 1 Tube prn   No current facility-administered medications for this visit.     PHYSICAL EXAMINATION: ECOG PERFORMANCE STATUS: 2 - Symptomatic, <50% confined to bed  Today's Vitals   03/03/19 0958 03/03/19 1006 03/03/19 1007  BP: 113/73 113/73   Pulse: 73 73   Resp: 18 18   Temp: 98.5 F (36.9 C) 98.5 F (36.9 C)   TempSrc: Oral Oral   SpO2:  100%   Weight: 158 lb 14.4 oz (72.1 kg) 158 lb 14.4 oz (72.1 kg)   Height: 5'  2" (1.575 m) 5' 2"  (1.575 m)   PainSc:   0-No pain   Body mass index is 29.06 kg/m.  Filed Weights   03/03/19 0958 03/03/19 1006  Weight: 158 lb 14.4 oz (72.1 kg) 158 lb 14.4 oz (72.1 kg)    GENERAL: alert, no distress and comfortable SKIN: skin color, texture, turgor are normal, no rashes or significant lesions EYES: conjunctiva are pink and non-injected, sclera clear NECK: supple, non-tender, tracheostomy clean without any bleeding  LYMPH:  no palpable lymphadenopathy in the cervical LUNGS: clear to auscultation with normal breathing effort HEART: regular rate & rhythm and no murmurs and no lower extremity edema ABDOMEN: soft, non-tender, non-distended, normal bowel sounds Musculoskeletal: no cyanosis of digits and no clubbing  PSYCH: alert & oriented x 3, fluent speech NEURO: no focal motor/sensory deficits  LABORATORY DATA:  I have reviewed the data as listed    Component Value Date/Time   NA 137 03/03/2019 0913   K 4.3 03/03/2019 0913   CL 102 03/03/2019 0913   CO2 24 03/03/2019 0913   GLUCOSE 83 03/03/2019 0913   BUN 17 03/03/2019 0913   CREATININE 0.71 03/03/2019 0913   CALCIUM 9.6 03/03/2019 0913   PROT 6.9 02/17/2019 1000   ALBUMIN 3.4 (L) 02/17/2019 1000   AST 17 02/17/2019 1000   ALT 25 02/17/2019 1000   ALKPHOS 93 02/17/2019 1000   BILITOT 0.5 02/17/2019 1000   GFRNONAA >60 03/03/2019 0913   GFRAA >60 03/03/2019 0913    No results found for: SPEP, UPEP  Lab Results  Component Value Date   WBC 4.5 03/03/2019   NEUTROABS 2.8 03/03/2019   HGB 11.3 (L) 03/03/2019   HCT 35.4 (L) 03/03/2019   MCV 83.5 03/03/2019   PLT 241 03/03/2019      Chemistry      Component Value Date/Time   NA 137 03/03/2019 0913   K 4.3 03/03/2019 0913   CL 102 03/03/2019 0913   CO2 24 03/03/2019 0913   BUN 17 03/03/2019 0913   CREATININE 0.71 03/03/2019 0913      Component Value Date/Time   CALCIUM 9.6 03/03/2019 0913   ALKPHOS 93 02/17/2019 1000   AST 17 02/17/2019  1000   ALT 25 02/17/2019 1000   BILITOT 0.5 02/17/2019 1000       RADIOGRAPHIC STUDIES: I have personally reviewed the radiological images as listed below and agreed with the findings in the report. Ir Gastrostomy Tube Mod Sed  Result Date: 02/09/2019 CLINICAL DATA:  Supraglottic carcinoma, needs enteral feeding support EXAM: PERC PLACEMENT GASTROSTOMY FLUOROSCOPY TIME:  1.3 minute; 450  uGym2 DAP TECHNIQUE: The procedure, risks, benefits, and alternatives were explained to the patient. Questions regarding the procedure were encouraged and answered. The patient understands and consents to the procedure. As antibiotic prophylaxis, cefazolin 2 g was ordered pre-procedure and administered intravenously within one hour of incision. An appropriate percutaneous approach was confirmed on recent PET-CT. A 5 French angiographic catheter was placed as orogastric tube. The upper abdomen was prepped with Betadine, draped in usual sterile fashion, and infiltrated locally with 1% lidocaine. Intravenous Fentanyl 162mg and Versed 361mwere administered as conscious sedation during continuous monitoring of the patient's level of consciousness and physiological / cardiorespiratory status by the radiology RN, with a total moderate sedation time of 13 minutes. 1 mg glucagon given IV to facilitate gastric distention.Stomach was insufflated using air through the orogastric tube. An 1857rench sheath needle was advanced percutaneously into the gastric lumen under fluoroscopy. Gas could be aspirated and a small contrast injection confirmed intraluminal spread. The sheath was exchanged over a guidewire for a 9 Fr[place]ascular sheath, through which the snare device was advanced and used to snare a guidewire passed through the orogastric tube. This was withdrawn, and the snare attached to the 20 French pull-through gastrostomy tube, which was advanced antegrade, positioned with the internal bumper securing the anterior gastric wall  to the anterior abdominal wall. Small contrast injection confirms appropriate positioning. The external bumper was applied and the catheter was flushed. COMPLICATIONS: COMPLICATIONS none IMPRESSION: 1. Technically successful 20 French pull-through gastrostomy placement under fluoroscopy. Electronically Signed   By: D Lucrezia Europe.D.   On: 02/09/2019 13:39   Ir Imaging Guided Port Insertion  Result Date: 02/02/2019 INDICATION: History of metastatic head neck cancer, in need of durable intravenous access for chemotherapy administration. EXAM: IMPLANTED PORT A CATH PLACEMENT WITH ULTRASOUND AND FLUOROSCOPIC GUIDANCE COMPARISON:  PET-CT - 01/26/2019 MEDICATIONS: Ancef 2 gm IV; The antibiotic was administered within an appropriate time interval prior to skin puncture. ANESTHESIA/SEDATION: Moderate (conscious) sedation was employed during this procedure. A total of Versed 2 mg and Fentanyl 100 mcg was administered intravenously. Moderate Sedation Time: 23 minutes. The patient's level of consciousness and vital signs were monitored continuously by radiology nursing throughout the procedure under my direct supervision. CONTRAST:  None FLUOROSCOPY TIME:  24 seconds (6 mGy) COMPLICATIONS: None immediate. PROCEDURE: The procedure, risks, benefits, and alternatives were explained to the patient. Questions regarding the procedure were encouraged and answered. The patient understands and consents to the procedure. The right neck and chest were prepped with chlorhexidine in a sterile fashion, and a sterile drape was applied covering the operative field. Maximum barrier sterile technique with sterile gowns and gloves were used for the procedure. A timeout was performed prior to the initiation of the procedure. Local anesthesia was provided with 1% lidocaine with epinephrine. After creating a small venotomy incision, a micropuncture kit was utilized to access the internal jugular vein. Real-time ultrasound guidance was utilized  for vascular access including the acquisition of a permanent ultrasound image documenting patency of the accessed vessel. The microwire was utilized to measure appropriate catheter length. A subcutaneous port pocket was then created along the upper chest wall utilizing a combination of sharp and blunt dissection. The pocket was irrigated with sterile saline. A single lumen slim power injectable port was chosen for placement. The 8 Fr catheter was tunneled from the port  pocket site to the venotomy incision. The port was placed in the pocket. The external catheter was trimmed to appropriate length. At the venotomy, an 8 Fr peel-away sheath was placed over a guidewire under fluoroscopic guidance. The catheter was then placed through the sheath and the sheath was removed. Final catheter positioning was confirmed and documented with a fluoroscopic spot radiograph. The port was accessed with a Huber needle, aspirated and flushed with heparinized saline. The venotomy site was closed with an interrupted 4-0 Vicryl suture. The port pocket incision was closed with interrupted 2-0 Vicryl suture and the skin was opposed with a running subcuticular 4-0 Vicryl suture. Dermabond and Steri-strips were applied to both incisions. Dressings were placed. The patient tolerated the procedure well without immediate post procedural complication. FINDINGS: After catheter placement, the tip lies within the superior cavoatrial junction. The catheter aspirates and flushes normally and is ready for immediate use. IMPRESSION: Successful placement of a right internal jugular approach power injectable Port-A-Cath. The catheter is ready for immediate use. Electronically Signed   By: [name] M.D.   On: 02/02/2019 16:52

## 2019-03-01 NOTE — Progress Notes (Signed)
RD working remotely.  Nutrition follow up completed with [name] and Vaughan Basta by telephone. Patient continues pureed foods that are thinned to consistency of water. She is drinking 3 High Protein Boost (240 kcal, 20 grams Pro each) She is infusing one bottles of Osmolite 1.5 once daily. (350 kcal, 15 grams pro) She drinks water and gatorade "all day". Last weight stable at 160 pounds.  Estimated Nutrition Needs: 2000-2280 kcal, 100 - 115 grams protein, 2 L fluid  Nutrition Diagnosis: Unintentional weight loss stable.  Intervention: Continue Boost High protein 3 times daily. Continue Osmolite 1.5 once daily. Diet per MD order (liquids vs pureeds) Water and gatorade as tolerated.  Monitoring, Evaluation, Goals: Adequate calories and protein for weight stabilization.  Next Visit: Thursday, June 18, per patient request. She will contact sooner if needed.

## 2019-03-02 ENCOUNTER — Ambulatory Visit
Admission: RE | Admit: 2019-03-02 | Discharge: 2019-03-02 | Disposition: A | Discharge status: Home / Self Care | Payer: Medicare HMO | Source: Ambulatory Visit | Attending: Radiation Oncology | Admitting: Radiation Oncology

## 2019-03-02 ENCOUNTER — Other Ambulatory Visit: Payer: Self-pay

## 2019-03-02 DIAGNOSIS — Z510 Encounter for antineoplastic radiation therapy: Secondary | ICD-10-CM | POA: Diagnosis not present

## 2019-03-02 DIAGNOSIS — C321 Malignant neoplasm of supraglottis: Secondary | ICD-10-CM | POA: Diagnosis not present

## 2019-03-03 ENCOUNTER — Other Ambulatory Visit: Payer: Self-pay

## 2019-03-03 ENCOUNTER — Inpatient Hospital Stay (HOSPITAL_BASED_OUTPATIENT_CLINIC_OR_DEPARTMENT_OTHER): Payer: Medicare HMO | Admitting: Hematology

## 2019-03-03 ENCOUNTER — Ambulatory Visit
Admission: RE | Admit: 2019-03-03 | Discharge: 2019-03-03 | Disposition: A | Discharge status: Home / Self Care | Payer: Medicare HMO | Source: Ambulatory Visit | Attending: Radiation Oncology | Admitting: Radiation Oncology

## 2019-03-03 ENCOUNTER — Inpatient Hospital Stay: Payer: Medicare HMO

## 2019-03-03 ENCOUNTER — Encounter: Payer: Self-pay | Admitting: Hematology

## 2019-03-03 ENCOUNTER — Telehealth: Payer: Self-pay | Admitting: Hematology

## 2019-03-03 VITALS — BP 113/73 | HR 73 | Temp 98.5°F | Resp 18 | Ht 62.0 in | Wt 158.9 lb

## 2019-03-03 DIAGNOSIS — C321 Malignant neoplasm of supraglottis: Secondary | ICD-10-CM

## 2019-03-03 DIAGNOSIS — Z79899 Other long term (current) drug therapy: Secondary | ICD-10-CM | POA: Diagnosis not present

## 2019-03-03 DIAGNOSIS — E46 Unspecified protein-calorie malnutrition: Secondary | ICD-10-CM

## 2019-03-03 DIAGNOSIS — I700 Atherosclerosis of aorta: Secondary | ICD-10-CM

## 2019-03-03 DIAGNOSIS — Z5111 Encounter for antineoplastic chemotherapy: Secondary | ICD-10-CM | POA: Diagnosis not present

## 2019-03-03 DIAGNOSIS — K8020 Calculus of gallbladder without cholecystitis without obstruction: Secondary | ICD-10-CM

## 2019-03-03 DIAGNOSIS — K3189 Other diseases of stomach and duodenum: Secondary | ICD-10-CM

## 2019-03-03 DIAGNOSIS — K5900 Constipation, unspecified: Secondary | ICD-10-CM | POA: Diagnosis not present

## 2019-03-03 DIAGNOSIS — R05 Cough: Secondary | ICD-10-CM

## 2019-03-03 DIAGNOSIS — Z95828 Presence of other vascular implants and grafts: Secondary | ICD-10-CM

## 2019-03-03 DIAGNOSIS — D701 Agranulocytosis secondary to cancer chemotherapy: Secondary | ICD-10-CM | POA: Diagnosis not present

## 2019-03-03 DIAGNOSIS — T451X5A Adverse effect of antineoplastic and immunosuppressive drugs, initial encounter: Secondary | ICD-10-CM | POA: Diagnosis not present

## 2019-03-03 DIAGNOSIS — D6481 Anemia due to antineoplastic chemotherapy: Secondary | ICD-10-CM | POA: Insufficient documentation

## 2019-03-03 DIAGNOSIS — Z510 Encounter for antineoplastic radiation therapy: Secondary | ICD-10-CM | POA: Diagnosis not present

## 2019-03-03 DIAGNOSIS — F419 Anxiety disorder, unspecified: Secondary | ICD-10-CM

## 2019-03-03 DIAGNOSIS — R109 Unspecified abdominal pain: Secondary | ICD-10-CM | POA: Diagnosis not present

## 2019-03-03 DIAGNOSIS — R69 Illness, unspecified: Secondary | ICD-10-CM | POA: Diagnosis not present

## 2019-03-03 DIAGNOSIS — J0410 Acute tracheitis without obstruction: Secondary | ICD-10-CM

## 2019-03-03 DIAGNOSIS — R042 Hemoptysis: Secondary | ICD-10-CM

## 2019-03-03 LAB — CBC WITH DIFFERENTIAL (CANCER CENTER ONLY)
Abs Immature Granulocytes: 0.01 10*3/uL (ref 0.00–0.07)
Basophils Absolute: 0 10*3/uL (ref 0.0–0.1)
Basophils Relative: 0 %
Eosinophils Absolute: 0.1 10*3/uL (ref 0.0–0.5)
Eosinophils Relative: 3 %
HCT: 35.4 % — ABNORMAL LOW (ref 36.0–46.0)
Hemoglobin: 11.3 g/dL — ABNORMAL LOW (ref 12.0–15.0)
Immature Granulocytes: 0 %
Lymphocytes Relative: 23 %
Lymphs Abs: 1 10*3/uL (ref 0.7–4.0)
MCH: 26.7 pg (ref 26.0–34.0)
MCHC: 31.9 g/dL (ref 30.0–36.0)
MCV: 83.5 fL (ref 80.0–100.0)
Monocytes Absolute: 0.5 10*3/uL (ref 0.1–1.0)
Monocytes Relative: 12 %
Neutro Abs: 2.8 10*3/uL (ref 1.7–7.7)
Neutrophils Relative %: 62 %
Platelet Count: 241 10*3/uL (ref 150–400)
RBC: 4.24 MIL/uL (ref 3.87–5.11)
RDW: 15.4 % (ref 11.5–15.5)
WBC Count: 4.5 10*3/uL (ref 4.0–10.5)
nRBC: 0 % (ref 0.0–0.2)

## 2019-03-03 LAB — BASIC METABOLIC PANEL - CANCER CENTER ONLY
Anion gap: 11 (ref 5–15)
BUN: 17 mg/dL (ref 8–23)
CO2: 24 mmol/L (ref 22–32)
Calcium: 9.6 mg/dL (ref 8.9–10.3)
Chloride: 102 mmol/L (ref 98–111)
Creatinine: 0.71 mg/dL (ref 0.44–1.00)
GFR, Est AFR Am: >60 mL/min (ref >60)
GFR, Estimated: >60 mL/min (ref >60)
Glucose, Bld: 83 mg/dL (ref 70–99)
Potassium: 4.3 mmol/L (ref 3.5–5.1)
Sodium: 137 mmol/L (ref 135–145)

## 2019-03-03 LAB — MAGNESIUM: Magnesium: 2.2 mg/dL (ref 1.7–2.4)

## 2019-03-03 MED ORDER — SODIUM CHLORIDE 0.9% FLUSH
10.0000 mL | INTRAVENOUS | Status: DC | PRN
  Administered 2019-03-03: 10 mL
  Filled 2019-03-03: qty 10

## 2019-03-03 NOTE — Telephone Encounter (Signed)
Scheduled appt per 6/10 los.

## 2019-03-04 ENCOUNTER — Ambulatory Visit
Admission: RE | Admit: 2019-03-04 | Discharge: 2019-03-04 | Disposition: A | Discharge status: Home / Self Care | Payer: Medicare HMO | Source: Ambulatory Visit | Attending: Radiation Oncology | Admitting: Radiation Oncology

## 2019-03-04 ENCOUNTER — Other Ambulatory Visit: Payer: Self-pay

## 2019-03-04 ENCOUNTER — Inpatient Hospital Stay: Payer: Medicare HMO

## 2019-03-04 ENCOUNTER — Encounter: Payer: Medicare HMO | Admitting: Nutrition

## 2019-03-04 VITALS — BP 115/72 | HR 75 | Temp 98.9°F | Resp 20

## 2019-03-04 DIAGNOSIS — Z510 Encounter for antineoplastic radiation therapy: Secondary | ICD-10-CM | POA: Diagnosis not present

## 2019-03-04 DIAGNOSIS — Z5111 Encounter for antineoplastic chemotherapy: Secondary | ICD-10-CM | POA: Diagnosis not present

## 2019-03-04 DIAGNOSIS — C321 Malignant neoplasm of supraglottis: Secondary | ICD-10-CM | POA: Diagnosis not present

## 2019-03-04 MED ORDER — SODIUM CHLORIDE 0.9 % IV SOLN
40.0000 mg/m2 | Freq: Once | INTRAVENOUS | Status: AC
  Administered 2019-03-04: 72 mg via INTRAVENOUS
  Filled 2019-03-04: qty 72

## 2019-03-04 MED ORDER — SODIUM CHLORIDE 0.9 % IV SOLN
Freq: Once | INTRAVENOUS | Status: AC
  Administered 2019-03-04: 09:00:00 via INTRAVENOUS
  Filled 2019-03-04: qty 250

## 2019-03-04 MED ORDER — SODIUM CHLORIDE 0.9 % IV SOLN
Freq: Once | INTRAVENOUS | Status: AC
  Administered 2019-03-04: 11:00:00 via INTRAVENOUS
  Filled 2019-03-04: qty 5

## 2019-03-04 MED ORDER — PALONOSETRON HCL INJECTION 0.25 MG/5ML
INTRAVENOUS | Status: AC
  Filled 2019-03-04: qty 5

## 2019-03-04 MED ORDER — SODIUM CHLORIDE 0.9% FLUSH
10.0000 mL | INTRAVENOUS | Status: DC | PRN
  Administered 2019-03-04: 15:00:00 10 mL
  Filled 2019-03-04: qty 10

## 2019-03-04 MED ORDER — PALONOSETRON HCL INJECTION 0.25 MG/5ML
0.2500 mg | Freq: Once | INTRAVENOUS | Status: AC
  Administered 2019-03-04: 0.25 mg via INTRAVENOUS

## 2019-03-04 MED ORDER — POTASSIUM CHLORIDE 2 MEQ/ML IV SOLN
Freq: Once | INTRAVENOUS | Status: AC
  Administered 2019-03-04: 09:00:00 via INTRAVENOUS
  Filled 2019-03-04: qty 10

## 2019-03-04 MED ORDER — HEPARIN SOD (PORK) LOCK FLUSH 100 UNIT/ML IV SOLN
500.0000 [IU] | Freq: Once | INTRAVENOUS | Status: AC | PRN
  Administered 2019-03-04: 500 [IU]
  Filled 2019-03-04: qty 5

## 2019-03-04 NOTE — Patient Instructions (Signed)
[facility] Discharge Instructions for Patients Receiving Chemotherapy  Today you received the following chemotherapy agents Cisplatin  To help prevent nausea and vomiting after your treatment, we encourage you to take your nausea medication as directed by your MD.   If you develop nausea and vomiting that is not controlled by your nausea medication, call the clinic.   BELOW ARE SYMPTOMS THAT SHOULD BE REPORTED IMMEDIATELY:  *FEVER GREATER THAN 100.5 F  *CHILLS WITH OR WITHOUT FEVER  NAUSEA AND VOMITING THAT IS NOT CONTROLLED WITH YOUR NAUSEA MEDICATION  *UNUSUAL SHORTNESS OF BREATH  *UNUSUAL BRUISING OR BLEEDING  TENDERNESS IN MOUTH AND THROAT WITH OR WITHOUT PRESENCE OF ULCERS  *URINARY PROBLEMS  *BOWEL PROBLEMS  UNUSUAL RASH Items with * indicate a potential emergency and should be followed up as soon as possible.  Feel free to call the clinic should you have any questions or concerns. The clinic phone number is [phone].  Please show the [facility] at check-in to the Emergency Department and triage nurse.  Coronavirus (COVID-19) Are you at risk?  Are you at risk for the Coronavirus (COVID-19)?  To be considered HIGH RISK for Coronavirus (COVID-19), you have to meet the following criteria:  . Traveled to [place], [place], [place], [place] or [place]; or in the [place] to [place], [place], [place], or [place]; and have fever, cough, and shortness of breath within the last 2 weeks of travel OR . Been in close contact with a person diagnosed with COVID-19 within the last 2 weeks and have fever, cough, and shortness of breath . IF YOU DO NOT MEET THESE CRITERIA, YOU ARE CONSIDERED LOW RISK FOR COVID-19.  What to do if you are HIGH RISK for COVID-19?  Marland Kitchen If you are having a medical emergency, call 911. . Seek medical care right away. Before you go to a doctor's office, urgent care or emergency department, call ahead and tell them about  your recent travel, contact with someone diagnosed with COVID-19, and your symptoms. You should receive instructions from your physician's office regarding next steps of care.  . When you arrive at healthcare provider, tell the healthcare staff immediately you have returned from visiting [place], [place], [place], [place] or [place]; or traveled in the [place] to [place], [place], [place], or [place]; in the last two weeks or you have been in close contact with a person diagnosed with COVID-19 in the last 2 weeks.   . Tell the health care staff about your symptoms: fever, cough and shortness of breath. . After you have been seen by a medical provider, you will be either: o Tested for (COVID-19) and discharged home on quarantine except to seek medical care if symptoms worsen, and asked to  - Stay home and avoid contact with others until you get your results (4-5 days)  - Avoid travel on public transportation if possible (such as bus, train, or airplane) or o Sent to the Emergency Department by EMS for evaluation, COVID-19 testing, and possible admission depending on your condition and test results.  What to do if you are LOW RISK for COVID-19?  Reduce your risk of any infection by using the same precautions used for avoiding the common cold or flu:  Marland Kitchen Wash your hands often with soap and warm water for at least 20 seconds.  If soap and water are not readily available, use an alcohol-based hand sanitizer with at least 60% alcohol.  . If  coughing or sneezing, cover your mouth and nose by coughing or sneezing into the elbow areas of your shirt or coat, into a tissue or into your sleeve (not your hands). . Avoid shaking hands with others and consider head nods or verbal greetings only. . Avoid touching your eyes, nose, or mouth with unwashed hands.  . Avoid close contact with people who are sick. . Avoid places or events with large numbers of people in one location, like concerts or sporting  events. . Carefully consider travel plans you have or are making. . If you are planning any travel outside or inside the [place], visit the CDC's Travelers' Health webpage for the latest health notices. . If you have some symptoms but not all symptoms, continue to monitor at home and seek medical attention if your symptoms worsen. . If you are having a medical emergency, call 911.   [name] / e-Visit: [email or web address]         [facility]: [facility]: [phone]                   [facility]: [phone]

## 2019-03-05 ENCOUNTER — Other Ambulatory Visit: Payer: Self-pay

## 2019-03-05 ENCOUNTER — Ambulatory Visit
Admission: RE | Admit: 2019-03-05 | Discharge: 2019-03-05 | Disposition: A | Discharge status: Home / Self Care | Payer: Medicare HMO | Source: Ambulatory Visit | Attending: Radiation Oncology | Admitting: Radiation Oncology

## 2019-03-05 DIAGNOSIS — Z87891 Personal history of nicotine dependence: Secondary | ICD-10-CM | POA: Diagnosis not present

## 2019-03-05 DIAGNOSIS — Z930 Tracheostomy status: Secondary | ICD-10-CM | POA: Diagnosis not present

## 2019-03-05 DIAGNOSIS — C321 Malignant neoplasm of supraglottis: Secondary | ICD-10-CM | POA: Diagnosis not present

## 2019-03-05 DIAGNOSIS — Z431 Encounter for attention to gastrostomy: Secondary | ICD-10-CM | POA: Diagnosis not present

## 2019-03-05 DIAGNOSIS — Z510 Encounter for antineoplastic radiation therapy: Secondary | ICD-10-CM | POA: Diagnosis not present

## 2019-03-05 NOTE — Progress Notes (Signed)
[facility] OFFICE PROGRESS NOTE  Patient Care Team: Philmore Pali, NP as PCP - General (Nurse Practitioner) Eppie Gibson, MD as Attending Physician (Radiation Oncology) Tish Men, MD as Consulting Physician (Hematology) Leota Sauers, RN as Oncology Nurse Navigator Karie Mainland, RD as Dietitian (Nutrition) Sharen Counter, CCC-SLP as Speech Language Pathologist (Speech Pathology)  HEME/ONC OVERVIEW: 1.  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 Suspicious <1cm bilateral Level II cervical LN's.   Transferred to [facility] for airway compromise and taken to OR for tracheostomy, bx of the supraglottic lesion did not show any malignancy  Bx of the left cervical LN was non-diagnostic -12/2018: repeat DL with bx of the supraglottic mass showed well-differentiated invasive squamous cell carcinoma  -01/2019: PET showed locally advanced FDG-avid laryngeal neoplasm with bilateral cervical LN involvement as well as mildly FDG-avid mediastinal LN's, concerning for metastatic disease; EUS w/ bx of the mediastinal LN negative for malignancy -Mid-01/2019 - present: definitive chemoradiation with weekly cisplatin   2. 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 -S/p 3 doses of weekly cisplatin concurrent with RT in the definitive setting; dose #4 delayed by one week due to bleeding from the tracheostomy -Labs adequate, proceed with Cycle 4 of chemotherapy -Plan for 7 cycles of chemotherapy  -PRN anti-emetics: Zofran, Compazine, Ativan and  dexamethasone   Chemotherapy-associated leukopenia -Secondary to chemotherapy -WBC 3.8k with ANC 2300, slightly lower than last week -Patient denies any symptoms of infection -We will monitor for now; no indication for dose adjustment -If leukopenia worsens in the future, we will consider delaying chemotherapy or adjusting chemotherapy dose  Chemotherapy-associated anemia -Secondary to chemotherapy -Hgb 11.8, stable -Patient denies any symptom of bleeding -We will monitor for now; no indication for dose adjustment  Protein malnutrition -Secondary to chemoradiation -Weight down by 3 lbs since the last visit  -Currently taking ~9 Boosts and food via blender, 4-5 bottles of 8oz water by mouth as well as 1 bottle of Osmolite via feeding tube -Given the weight loss, I recommended increasing Osmolite to 2 cans per day -I encouraged the patient to maintain adequate PO intake as tolerated, and to adhere to nutritional recommendations   Constipation -No improvement with Colace -I recommended adding OTC Miralax and/or Senna   Abdominal pain -Secondary to PEG tube placement -Pain overall improving -IR liquid morphine 10mg /0.7mL q4hrs PRN  No orders of the defined types were placed in this encounter.  All questions were answered. The patient knows to call the clinic with any problems, questions or concerns. No barriers to learning was detected.  Return in 1 week for labs, port flush, clinic appointment, and Cycle 5 of weekly cisplatin.  Tish Men, MD 03/10/2019 10:21 AM  CHIEF COMPLAINT: "I am doing okay"  INTERVAL HISTORY: Mr. Tellez returns to clinic for follow-up of squamous cell carcinoma of the supraglottic larynx on definitive chemoradiation.  Patient reports that she still has intermittent small amount of hemoptysis via the tracheostomy, usually in the morning, for which she uses few drops of saline to clear the secretion.  She  does not have any persistent hemoptysis during the  day.  She currently takes 3 boosts 3 times a day, 4-5 bottles of 8 ounce water and food via blender by mouth, as well as 1 cans of Osmolite at night.  She feels constipated and tried OTC Colace without improvement.  She takes PRN Ativan for anxiety before each radiation session due to claustrophobia.  She still has some soreness around the feeding tube site, which is overall improving, but she has not yet required taking any pain medication.  She denies any other complaint today.  SUMMARY OF ONCOLOGIC HISTORY: Oncology History  Malignant neoplasm of supraglottis ([place])  12/02/2018 Imaging   CT neck: IMPRESSION: 1. Supraglottic laryngeal carcinoma extending across the anterior commissure and invading the prelaryngeal fat, left aryepiglottic fold and left aspect of the epiglottis. 2. Severe narrowing of the laryngeal airway. 3. Bilateral subcentimeter level 2A and 2A cervical lymph nodes.  Critical Value/emergent results were called by telephone at the time of interpretation on 12/02/2018 at 5:34 pm to Dr. Collier Salina, who verbally acknowledged these results.   12/02/2018 Imaging   CT  Chest:  IMPRESSION: No evidence of metastatic disease in the chest.   12/03/2018 Pathology Results   (Care Everywhere; [facility]) A: Larynx, supraglottis, biopsy - Atypical squamous proliferation with at least low grade dysplasia and abundant keratinization - No definite high grade dysplasia or invasive carcinoma represented (see comment)   12/18/2018 Pathology Results   (Care Everywhere; [facility]) A:  Neck, left, fine needle aspiration - Rare single atypical cells, no cells diagnostic of malignancy - Lymph node material present  B:  Neck, right, fine needle aspiration - Non-diagnostic specimen - No lymph node material identified - Mixed inflammation and blood   12/31/2018 Pathology Results   (Care Everywhere; [facility])  A: Larynx, left supraglottic mass, biopsy - Atypical squamous  proliferation with abundant keratinization, suspicious for well-differentiated invasive squamous cell carcinoma  B: Larynx, left supraglottic mass, biopsy - Well-differentiated invasive squamous cell carcinoma with abundant keratinization (see comment)   01/18/2019 Initial Diagnosis   Laryngeal cancer ([place])   01/26/2019 Imaging   PET: IMPRESSION: 1. Locally advanced hypermetabolic laryngeal neoplasm centered in the left supraglottic space with bilateral glottic and epiglottic involvement and subglottic extension on the left. 2. Hypermetabolic bilateral level 3 neck nodal metastases. 3. Hypermetabolic high mediastinal nodal metastases between the trachea and upper thoracic esophagus. 4. Otherwise no distant hypermetabolic metastatic disease. 5. Nodular 7 mm focus at the left lung base, below PET resolution, recommend attention on follow-up chest CT in 3 months. 6. Chronic findings include: Aortic Atherosclerosis (ICD10-I70.0). Cholelithiasis.   02/08/2019 Cancer Staging   Staging form: Larynx - Supraglottis, AJCC 8th Edition - Clinical stage from 02/08/2019: Stage IVA (cT3, cN2c, cM0) - Signed by Eppie Gibson, MD on 02/08/2019   02/11/2019 -  Chemotherapy   The patient had palonosetron (ALOXI) injection 0.25 mg, 0.25 mg, Intravenous,  Once, 3 of 7 cycles Administration: 0.25 mg (02/11/2019), 0.25 mg (02/18/2019), 0.25 mg (03/04/2019) CISplatin (PLATINOL) 72 mg in sodium chloride 0.9 % 250 mL chemo infusion, 40 mg/m2 = 72 mg, Intravenous,  Once, 3 of 7 cycles Administration: 72 mg (02/11/2019), 72 mg (02/18/2019), 72 mg (03/04/2019) fosaprepitant (EMEND) 150 mg, dexamethasone (DECADRON) 12 mg in sodium chloride 0.9 % 145 mL IVPB, , Intravenous,  Once, 3 of 7 cycles Administration:  (02/11/2019),  (02/18/2019),  (03/04/2019)  for chemotherapy treatment.      REVIEW OF SYSTEMS:   Constitutional: ( - )  fevers, ( - )  chills , ( - ) night sweats Eyes: ( - ) blurriness of vision, ( - ) double  vision, ( - ) watery eyes Ears, nose, mouth, throat, and face: ( - ) mucositis, ( - ) sore throat Respiratory: ( + ) cough, ( - ) dyspnea, ( - ) wheezes Cardiovascular: ( - ) palpitation, ( - ) chest discomfort, ( - ) lower extremity swelling Gastrointestinal:  ( - ) nausea, ( - ) heartburn, ( + ) change in bowel habits Skin: ( - ) abnormal skin rashes Lymphatics: ( - ) new lymphadenopathy, ( - ) easy bruising Neurological: ( - ) numbness, ( - ) tingling, ( - ) new weaknesses Behavioral/Psych: ( - ) mood change, ( - ) new changes  All other systems were reviewed with the patient and are negative.  I have reviewed the past medical history, past surgical history, social history and family history with the patient and they are unchanged from previous note.  ALLERGIES:  has No Known Allergies.  MEDICATIONS:  Current Outpatient Medications  Medication Sig Dispense Refill  . chlorhexidine (PERIDEX) 0.12 % solution Rinse with 15 mls twice daily for 30 seconds. Use after breakfast and at bedtime. Spit out excess. Do not swallow. (Patient taking differently: Use as directed 15 mLs in the mouth or throat 2 (two) times daily. Rinse with 15 mls twice daily for 30 seconds. Use after breakfast and at bedtime. Spit out excess. Do not swallow.) 480 mL prn  . dexamethasone (DECADRON) 4 MG tablet Take 2 tablets by mouth once a day on the day after chemotherapy and then take 2 tablets two times a day for 2 days. Take with food. 30 tablet 1  . docusate sodium (COLACE) 100 MG capsule Take 100 mg by mouth 2 (two) times daily.    Marland Kitchen HYDROcodone-acetaminophen (HYCET) 7.5-325 mg/15 ml solution Take 15 mLs by mouth 4 (four) times daily as needed for moderate pain. 120 mL 0  . levothyroxine (SYNTHROID) 150 MCG tablet Take 150 mcg by mouth every other day. Alternating with 175 mcg dose    . levothyroxine (SYNTHROID) 175 MCG tablet Take 175 mcg by mouth every other day. Alternating with 150 mcg dose    . lidocaine  (XYLOCAINE) 2 % solution Patient: Mix 1part 2% viscous lidocaine, 1part H20. Swallow 87mL of diluted mixture, 56min before meals and at bedtime, up to QID prn soreness 100 mL 5  . lidocaine-prilocaine (EMLA) cream Apply to affected area once 30 g 3  . LORazepam (ATIVAN) 0.5 MG tablet Take 1 tablet (0.5 mg total) by mouth every 6 (six) hours as needed (Nausea or vomiting). 30 tablet 0  . Morphine Sulfate (MORPHINE CONCENTRATE) 10 mg / 0.5 ml concentrated solution Take 0.5 mLs (10 mg total) by mouth every 6 (six) hours as needed for up to 30 days for severe pain. 120 mL 0  . ondansetron (ZOFRAN) 8 MG tablet Take 1 tablet (8 mg total) by mouth 2 (two) times daily as needed. Start on the third day after chemotherapy. 30 tablet 1  . prochlorperazine (COMPAZINE) 10 MG tablet Take 1 tablet (10 mg total) by mouth every 6 (six) hours as needed (Nausea or vomiting). 30 tablet 1  . sodium fluoride (PREVIDENT 5000 PLUS) 1.1 % CREA dental cream Apply cream to tooth brush. Brush teeth for 2 minutes. Spit out excess. DO NOT rinse afterwards. Repeat nightly. (Patient taking differently: Place 1 application onto teeth 2 (two) times a day. )  1 Tube prn   No current facility-administered medications for this visit.     PHYSICAL EXAMINATION: ECOG PERFORMANCE STATUS: 1 - Symptomatic but completely ambulatory  Today's Vitals   03/10/19 0958 03/10/19 0959 03/10/19 1001  BP:  108/70 108/70  Pulse:  81 81  Resp:  18 18  Temp:  98.7 F (37.1 C) 98.7 F (37.1 C)  TempSrc:  Oral Oral  SpO2:  98% 98%  Weight:  155 lb 6.4 oz (70.5 kg) 155 lb 6.4 oz (70.5 kg)  Height:  5\' 2"  (1.575 m) 5\' 2"  (1.575 m)  PainSc: 0-No pain     Body mass index is 28.42 kg/m.  Filed Weights   03/10/19 0959 03/10/19 1001  Weight: 155 lb 6.4 oz (70.5 kg) 155 lb 6.4 oz (70.5 kg)    GENERAL: alert, no distress and comfortable SKIN: skin color, texture, turgor are normal, no rashes or significant lesions EYES: conjunctiva are pink and  non-injected, sclera clear OROPHARYNX: no exudate, no erythema; lips, buccal mucosa, and tongue normal  NECK: supple, non-tender, tracheostomy site clean LUNGS: clear to auscultation with normal breathing effort HEART: regular rate & rhythm and no murmurs and no lower extremity edema ABDOMEN: soft, non-tender, non-distended, normal bowel sounds Musculoskeletal: no cyanosis of digits and no clubbing  PSYCH: alert & oriented x 3, fluent speech NEURO: no focal motor/sensory deficits  LABORATORY DATA:  I have reviewed the data as listed    Component Value Date/Time   NA 137 03/03/2019 0913   K 4.3 03/03/2019 0913   CL 102 03/03/2019 0913   CO2 24 03/03/2019 0913   GLUCOSE 83 03/03/2019 0913   BUN 17 03/03/2019 0913   CREATININE 0.71 03/03/2019 0913   CALCIUM 9.6 03/03/2019 0913   PROT 6.9 02/17/2019 1000   ALBUMIN 3.4 (L) 02/17/2019 1000   AST 17 02/17/2019 1000   ALT 25 02/17/2019 1000   ALKPHOS 93 02/17/2019 1000   BILITOT 0.5 02/17/2019 1000   GFRNONAA >60 03/03/2019 0913   GFRAA >60 03/03/2019 0913    No results found for: SPEP, UPEP  Lab Results  Component Value Date   WBC 3.8 (L) 03/10/2019   NEUTROABS 2.3 03/10/2019   HGB 11.8 (L) 03/10/2019   HCT 36.2 03/10/2019   MCV 81.7 03/10/2019   PLT 219 03/10/2019      Chemistry      Component Value Date/Time   NA 137 03/03/2019 0913   K 4.3 03/03/2019 0913   CL 102 03/03/2019 0913   CO2 24 03/03/2019 0913   BUN 17 03/03/2019 0913   CREATININE 0.71 03/03/2019 0913      Component Value Date/Time   CALCIUM 9.6 03/03/2019 0913   ALKPHOS 93 02/17/2019 1000   AST 17 02/17/2019 1000   ALT 25 02/17/2019 1000   BILITOT 0.5 02/17/2019 1000       RADIOGRAPHIC STUDIES: I have personally reviewed the radiological images as listed below and agreed with the findings in the report. Ir Gastrostomy Tube Mod Sed  Result Date: 02/09/2019 CLINICAL DATA:  Supraglottic carcinoma, needs enteral feeding support EXAM: PERC  PLACEMENT GASTROSTOMY FLUOROSCOPY TIME:  1.3 minute; 450  uGym2 DAP TECHNIQUE: The procedure, risks, benefits, and alternatives were explained to the patient. Questions regarding the procedure were encouraged and answered. The patient understands and consents to the procedure. As antibiotic prophylaxis, cefazolin 2 g was ordered pre-procedure and administered intravenously within one hour of incision. An appropriate percutaneous approach was confirmed on recent PET-CT. A 5 [place]  angiographic catheter was placed as orogastric tube. The upper abdomen was prepped with Betadine, draped in usual sterile fashion, and infiltrated locally with 1% lidocaine. Intravenous Fentanyl 173mcg and Versed 3mg  were administered as conscious sedation during continuous monitoring of the patient's level of consciousness and physiological / cardiorespiratory status by the radiology RN, with a total moderate sedation time of 13 minutes. 1 mg glucagon given IV to facilitate gastric distention.Stomach was insufflated using air through the orogastric tube. An 76 French sheath needle was advanced percutaneously into the gastric lumen under fluoroscopy. Gas could be aspirated and a small contrast injection confirmed intraluminal spread. The sheath was exchanged over a guidewire for a 9 [place] vascular sheath, through which the snare device was advanced and used to snare a guidewire passed through the orogastric tube. This was withdrawn, and the snare attached to the 20 French pull-through gastrostomy tube, which was advanced antegrade, positioned with the internal bumper securing the anterior gastric wall to the anterior abdominal wall. Small contrast injection confirms appropriate positioning. The external bumper was applied and the catheter was flushed. COMPLICATIONS: COMPLICATIONS none IMPRESSION: 1. Technically successful 20 French pull-through gastrostomy placement under fluoroscopy. Electronically Signed   By: Lucrezia Europe M.D.   On:  02/09/2019 13:39

## 2019-03-08 ENCOUNTER — Ambulatory Visit: Payer: Medicare HMO

## 2019-03-08 ENCOUNTER — Ambulatory Visit
Admission: RE | Admit: 2019-03-08 | Discharge: 2019-03-08 | Disposition: A | Discharge status: Home / Self Care | Payer: Medicare HMO | Source: Ambulatory Visit | Attending: Radiation Oncology | Admitting: Radiation Oncology

## 2019-03-08 ENCOUNTER — Other Ambulatory Visit: Payer: Self-pay

## 2019-03-08 ENCOUNTER — Ambulatory Visit: Payer: Medicare HMO | Attending: Radiation Oncology

## 2019-03-08 DIAGNOSIS — Z510 Encounter for antineoplastic radiation therapy: Secondary | ICD-10-CM | POA: Diagnosis not present

## 2019-03-08 DIAGNOSIS — R1312 Dysphagia, oropharyngeal phase: Secondary | ICD-10-CM

## 2019-03-08 DIAGNOSIS — C321 Malignant neoplasm of supraglottis: Secondary | ICD-10-CM | POA: Diagnosis not present

## 2019-03-08 NOTE — Therapy (Signed)
[facility] [address] [place], [place], [address] Phone: [phone]   Fax:  [phone]  Speech Language Pathology Treatment  Patient Details  Name: [name] [name] MRN: [id number] Date of Birth: 06-Sep-1951 Referring Provider (SLP): Eppie Gibson, MD   Encounter Date: 03/08/2019  End of Session - 03/08/19 1113    Visit Number  2    Number of Visits  4    Date for SLP Re-Evaluation  05/03/19   90 days   SLP Start Time  0807    SLP Stop Time   0833    SLP Time Calculation (min)  26 min    Activity Tolerance  Patient tolerated treatment well       Past Medical History:  Diagnosis Date  . Cataract   . Laryngeal cancer ([place]) 11/2018  . Thyroid disease     Past Surgical History:  Procedure Laterality Date  . CATARACT EXTRACTION, BILATERAL  left 05/20/16, right 06/03/16   Cataract Extraction w/ IOL Implant; Surgeon: Sudie Grumbling, MD; Location: [facility]; Service: Ophthalmology   . CESAREAN SECTION    . ESOPHAGOGASTRODUODENOSCOPY (EGD) WITH PROPOFOL N/A 02/01/2019   Procedure: ESOPHAGOGASTRODUODENOSCOPY (EGD) WITH PROPOFOL;  Surgeon: Milus Banister, MD;  Location: [facility];  Service: Endoscopy;  Laterality: N/A;  . EUS N/A 02/01/2019   Procedure: UPPER ENDOSCOPIC ULTRASOUND (EUS) RADIAL-Will need Linear and Forward Viewing EUS Scope;  Surgeon: Milus Banister, MD;  Location: [facility];  Service: Endoscopy;  Laterality: N/A;  . FINE NEEDLE ASPIRATION N/A 02/01/2019   Procedure: FINE NEEDLE ASPIRATION (FNA) LINEAR;  Surgeon: Milus Banister, MD;  Location: [facility];  Service: Endoscopy;  Laterality: N/A;  . IR GASTROSTOMY TUBE MOD SED  02/09/2019  . IR IMAGING GUIDED PORT INSERTION  02/02/2019  . KNEE CARTILAGE SURGERY Right 2019  . TRACHEOSTOMY  12/02/2018   [facility]    There were no vitals filed for this visit.         ADULT SLP TREATMENT - 03/08/19 1106      General Information   Behavior/Cognition  Alert;Cooperative;Pleasant mood      Treatment Provided   Treatment provided  Dysphagia      Dysphagia Treatment   Temperature Spikes Noted  No    Respiratory Status  Trach   pt occluding trach to speak, with ~90% intelligibility   Treatment Methods  Patient/caregiver education    Other treatment/comments  Pt reports eating liquified food, as well as boost - thinks her weight is stable. SLP reminded pt why she is completing HEP as she did not respond with correct reasoning initially. Pt and partner denied pt difficulty with HEP and that she had been completing a few reps 4-5 times a day of SWALLOWING exercises, and piecemealing the other exercises as well. SLP reminded of need to get 15-20 reps of all swallowing exercises in, and all of the other reps total in, of remaining exercises. Pt/caregiver voiced understanding. SLP told pt he would like to have a Webex/video visit next session to observe pt with HEP. Pt agreed.      Assessment / Recommendations / Plan   Plan  Continue with current plan of care      Progression Toward Goals   Progression toward goals  Progressing toward goals       SLP Education - 03/08/19 1111    Education Details  use prescribed meds to assist with HEP completion as well as PO intake, "do what you can"  until next visit, need to eat/drink even though primary nutrition/hydration may be through PEG    Person(s) Educated  Patient;Caregiver(s)    Methods  Explanation    Comprehension  Verbalized understanding       SLP Short Term Goals - 03/08/19 1115      SLP SHORT TERM GOAL #1   Title  pt will demo understanding of correct procedure for swallow HEP with rare min A over two sessions    Time  2    Period  --   sessions, for all STGs   Status  On-going      SLP SHORT TERM GOAL #2   Title  pt will tell SLP why she is completing HEP over two sessions    Time  2    Status  On-going      SLP SHORT TERM GOAL #3   Title  pt will tell SLP 3  overt s/s aspiration PNA    Time  2    Status  On-going       SLP Long Term Goals - 03/08/19 1115      SLP LONG TERM GOAL #1   Status  On-going      SLP LONG TERM GOAL #2   Time  3    Period  --   sessions   Status  On-going       Plan - 03/08/19 1113    Clinical Impression Statement  This visit was completed via telephone at the direction of medical director Dr. Eppie Gibson due to additional pt safety necessary from the COVID-19 pandemic. Pt is currently on primarily liquid diet and does not report issues with coughing/choking with POs. No overt s/s aspiration PNA were noted today, as best as possible via telephone. The probability of swallowing difficulty increases dramatically with the initiation of chemo and radiation therapy. Pt will need to be followed by SLP for regular assessment of accurate HEP completion as well as for safety with POs both during and following treatment/s. Visits will be conducted via telephone or telehealth, remotely, until otherwise directed by medical director Dr. Eppie Gibson.    Speech Therapy Frequency  --   approx once every 4 weeks   Duration  --   3-4 visits in 90 days   Treatment/Interventions  Aspiration precaution training;Pharyngeal strengthening exercises;Diet toleration management by SLP;Compensatory techniques;SLP instruction and feedback;Patient/family education    Potential to Achieve Goals  Good    SLP Home Exercise Plan  provided today - Effortful, Masako, and chin tuck against resistance    Consulted and Agree with Plan of Care  Patient       Patient will benefit from skilled therapeutic intervention in order to improve the following deficits and impairments:   Dysphagia, oropharyngeal phase    Problem List Patient Active Problem List   Diagnosis Date Noted  . Anemia due to antineoplastic chemotherapy 03/03/2019  . Protein malnutrition ([place]) 03/03/2019  . Mucositis due to chemotherapy 02/17/2019  . Port-A-Cath in place  02/10/2019  . Malignant neoplasm of supraglottis ([place]) 01/18/2019    [facility] ,[place]  03/08/2019, 11:16 AM  [facility] [address] [place], [place], [address] Phone: [phone]   Fax:  [phone]   Name: [name] [name] MRN: [id number] Date of Birth: [birth year]

## 2019-03-09 ENCOUNTER — Other Ambulatory Visit: Payer: Self-pay

## 2019-03-09 ENCOUNTER — Other Ambulatory Visit (HOSPITAL_COMMUNITY): Payer: Self-pay | Admitting: *Deleted

## 2019-03-09 ENCOUNTER — Ambulatory Visit
Admission: RE | Admit: 2019-03-09 | Discharge: 2019-03-09 | Disposition: A | Discharge status: Home / Self Care | Payer: Medicare HMO | Source: Ambulatory Visit | Attending: Radiation Oncology | Admitting: Radiation Oncology

## 2019-03-09 DIAGNOSIS — Z87891 Personal history of nicotine dependence: Secondary | ICD-10-CM | POA: Diagnosis not present

## 2019-03-09 DIAGNOSIS — Z510 Encounter for antineoplastic radiation therapy: Secondary | ICD-10-CM | POA: Diagnosis not present

## 2019-03-09 DIAGNOSIS — J383 Other diseases of vocal cords: Secondary | ICD-10-CM | POA: Diagnosis not present

## 2019-03-09 DIAGNOSIS — Z430 Encounter for attention to tracheostomy: Secondary | ICD-10-CM | POA: Diagnosis not present

## 2019-03-09 DIAGNOSIS — R1310 Dysphagia, unspecified: Secondary | ICD-10-CM

## 2019-03-09 DIAGNOSIS — Z431 Encounter for attention to gastrostomy: Secondary | ICD-10-CM | POA: Diagnosis not present

## 2019-03-09 DIAGNOSIS — Z930 Tracheostomy status: Secondary | ICD-10-CM | POA: Diagnosis not present

## 2019-03-09 DIAGNOSIS — J387 Other diseases of larynx: Secondary | ICD-10-CM | POA: Diagnosis not present

## 2019-03-09 DIAGNOSIS — C321 Malignant neoplasm of supraglottis: Secondary | ICD-10-CM | POA: Diagnosis not present

## 2019-03-09 NOTE — Progress Notes (Signed)
[facility] OFFICE PROGRESS NOTE  Patient Care Team: Philmore Pali, NP as PCP - General (Nurse Practitioner) Eppie Gibson, MD as Attending Physician (Radiation Oncology) Tish Men, MD as Consulting Physician (Hematology) Leota Sauers, RN as Oncology Nurse Navigator Karie Mainland, RD as Dietitian (Nutrition) Sharen Counter, CCC-SLP as Speech Language Pathologist (Speech Pathology)  HEME/ONC OVERVIEW: 1.  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 Suspicious <1cm bilateral Level II cervical LN's.   Transferred to [facility] for airway compromise and taken to OR for tracheostomy, bx of the supraglottic lesion did not show any malignancy  Bx of the left cervical LN was non-diagnostic -12/2018: repeat DL with bx of the supraglottic mass showed well-differentiated invasive squamous cell carcinoma  -01/2019: PET showed locally advanced FDG-avid laryngeal neoplasm with bilateral cervical LN involvement as well as mildly FDG-avid mediastinal LN's, concerning for metastatic disease; EUS w/ bx of the mediastinal LN negative for malignancy -Mid-01/2019 - present: definitive chemoradiation with weekly cisplatin   2. 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 -S/p 4 doses of weekly cisplatin concurrent with RT in the definitive setting; dose #4 delayed by one week due to bleeding from the tracheostomy -Labs adequate, proceed with Cycle 5 of chemotherapy -Plan for 7 cycles of chemotherapy  -PRN anti-emetics: Zofran, Compazine, Ativan and  dexamethasone   Chemotherapy-associated anemia -Secondary to chemotherapy -Hgb 11.8, stable -Patient denies any symptom of bleeding -We will monitor for now; no indication for dose adjustment  Chemotherapy-associated mucositis -Secondary to chemotherapy -Currently taking salt/soda rinses and PRN viscous lidocaine swish and swallow  -I encouraged the patient to take IR liquid morphine 10mg /0.89mL q4hrs PRN for pain  Protein malnutrition -Secondary to chemoradiation -Weight down by 3 lbs since the last visit  -Currently taking 1-2 cans of Osmolite as well as 5-6 bottles of 8oz water per day  -Given the weight loss, I recommended the patient to increase Osmolite to 3-4 cans per day -I encouraged the patient to maintain adequate PO intake as tolerated, and to adhere to nutritional recommendations   Abdominal pain -Secondary to PEG tube placement -Pain overall improving -PRN IR liquid morphine as above  No orders of the defined types were placed in this encounter.  All questions were answered. The patient knows to call the clinic with any problems, questions or concerns. No barriers to learning was detected.  Return in one week for labs, port flush, clinic appt and Cycle 6 of weekly cisplatin.   Tish Men, MD 03/17/2019 12:21 PM  CHIEF COMPLAINT: "I am having a little more mouth pain"  INTERVAL HISTORY: Ms. Bamberg returns to clinic for follow-up of squamous cell carcinoma of the supraglottic larynx on definitive chemoRT. Patient reports that she has had more oral pain over the past a few days, for which she has been doing regular salt/soda rinses as well as viscous lidocaine as needed. She has not yet required taking liquid morphine for pain. She also reports having some persistent soreness around the feeding tube site, but it is overall tolerable. She also reports a  small blood clot via the tracheostomy in the morning, which loosen with saline drops, but she denies any recurrent  hemoptysis via the tracheostomy. She is currently still able to eat some food by mouth, such as eggs and grits, as well as 1-2 cans of Osmolite and 5-6 bottles of 8oz water per day. She denies any other complaint today.   SUMMARY OF ONCOLOGIC HISTORY: Oncology History  Malignant neoplasm of supraglottis ([place])  12/02/2018 Imaging   CT neck: IMPRESSION: 1. Supraglottic laryngeal carcinoma extending across the anterior commissure and invading the prelaryngeal fat, left aryepiglottic fold and left aspect of the epiglottis. 2. Severe narrowing of the laryngeal airway. 3. Bilateral subcentimeter level 2A and 2A cervical lymph nodes.  Critical Value/emergent results were called by telephone at the time of interpretation on 12/02/2018 at 5:34 pm to Dr. Collier Salina, who verbally acknowledged these results.   12/02/2018 Imaging   CT  Chest:  IMPRESSION: No evidence of metastatic disease in the chest.   12/03/2018 Pathology Results   (Care Everywhere; [facility]) A: Larynx, supraglottis, biopsy - Atypical squamous proliferation with at least low grade dysplasia and abundant keratinization - No definite high grade dysplasia or invasive carcinoma represented (see comment)   12/18/2018 Pathology Results   (Care Everywhere; [facility] [place] [facility]) A:  Neck, left, fine needle aspiration - Rare single atypical cells, no cells diagnostic of malignancy - Lymph node material present  B:  Neck, right, fine needle aspiration - Non-diagnostic specimen - No lymph node material identified - Mixed inflammation and blood   12/31/2018 Pathology Results   (Care Everywhere; [facility])  A: Larynx, left supraglottic mass, biopsy - Atypical squamous proliferation with abundant keratinization, suspicious for well-differentiated invasive squamous cell carcinoma  B: Larynx, left supraglottic mass, biopsy - Well-differentiated invasive squamous cell carcinoma with abundant keratinization (see comment)   01/18/2019  Initial Diagnosis   Laryngeal cancer ([place])   01/26/2019 Imaging   PET: IMPRESSION: 1. Locally advanced hypermetabolic laryngeal neoplasm centered in the left supraglottic space with bilateral glottic and epiglottic involvement and subglottic extension on the left. 2. Hypermetabolic bilateral level 3 neck nodal metastases. 3. Hypermetabolic high mediastinal nodal metastases between the trachea and upper thoracic esophagus. 4. Otherwise no distant hypermetabolic metastatic disease. 5. Nodular 7 mm focus at the left lung base, below PET resolution, recommend attention on follow-up chest CT in 3 months. 6. Chronic findings include: Aortic Atherosclerosis (ICD10-I70.0). Cholelithiasis.   02/08/2019 Cancer Staging   Staging form: Larynx - Supraglottis, AJCC 8th Edition - Clinical stage from 02/08/2019: Stage IVA (cT3, cN2c, cM0) - Signed by Eppie Gibson, MD on 02/08/2019   02/11/2019 -  Chemotherapy   The patient had palonosetron (ALOXI) injection 0.25 mg, 0.25 mg, Intravenous,  Once, 4 of 7 cycles Administration: 0.25 mg (02/11/2019), 0.25 mg (02/18/2019), 0.25 mg (03/04/2019), 0.25 mg (03/11/2019) CISplatin (PLATINOL) 72 mg in sodium chloride 0.9 % 250 mL chemo infusion, 40 mg/m2 = 72 mg, Intravenous,  Once, 4 of 7 cycles Administration: 72 mg (02/11/2019), 72 mg (02/18/2019), 72 mg (03/04/2019), 72 mg (03/11/2019) fosaprepitant (EMEND) 150 mg, dexamethasone (DECADRON) 12 mg in sodium chloride 0.9 % 145 mL IVPB, , Intravenous,  Once, 4 of 7 cycles Administration:  (02/11/2019),  (02/18/2019),  (03/04/2019),  (03/11/2019)  for chemotherapy treatment.      REVIEW OF SYSTEMS:   Constitutional: ( - ) fevers, ( - )  chills , ( - ) night sweats Eyes: ( - ) blurriness of vision, ( - ) double vision, ( - )  watery eyes Ears, nose, mouth, throat, and face: ( + ) mucositis, ( + ) sore throat Respiratory: ( - ) cough, ( - ) dyspnea, ( - ) wheezes Cardiovascular: ( - ) palpitation, ( - ) chest discomfort, ( - )  lower extremity swelling Gastrointestinal:  ( - ) nausea, ( - ) heartburn, ( - ) change in bowel habits Skin: ( - ) abnormal skin rashes Lymphatics: ( - ) new lymphadenopathy, ( - ) easy bruising Neurological: ( - ) numbness, ( - ) tingling, ( - ) new weaknesses Behavioral/Psych: ( - ) mood change, ( - ) new changes  All other systems were reviewed with the patient and are negative.  I have reviewed the past medical history, past surgical history, social history and family history with the patient and they are unchanged from previous note.  ALLERGIES:  has No Known Allergies.  MEDICATIONS:  Current Outpatient Medications  Medication Sig Dispense Refill  . chlorhexidine (PERIDEX) 0.12 % solution Rinse with 15 mls twice daily for 30 seconds. Use after breakfast and at bedtime. Spit out excess. Do not swallow. (Patient taking differently: Use as directed 15 mLs in the mouth or throat 2 (two) times daily. Rinse with 15 mls twice daily for 30 seconds. Use after breakfast and at bedtime. Spit out excess. Do not swallow.) 480 mL prn  . dexamethasone (DECADRON) 4 MG tablet Take 2 tablets by mouth once a day on the day after chemotherapy and then take 2 tablets two times a day for 2 days. Take with food. 30 tablet 1  . docusate sodium (COLACE) 100 MG capsule Take 100 mg by mouth 2 (two) times daily.    Marland Kitchen HYDROcodone-acetaminophen (HYCET) 7.5-325 mg/15 ml solution Take 15 mLs by mouth 4 (four) times daily as needed for moderate pain. 120 mL 0  . levothyroxine (SYNTHROID) 150 MCG tablet Take 150 mcg by mouth every other day. Alternating with 175 mcg dose    . levothyroxine (SYNTHROID) 175 MCG tablet Take 175 mcg by mouth every other day. Alternating with 150 mcg dose    . lidocaine (XYLOCAINE) 2 % solution Patient: Mix 1part 2% viscous lidocaine, 1part H20. Swallow 4mL of diluted mixture, 83min before meals and at bedtime, up to QID prn soreness 100 mL 5  . lidocaine-prilocaine (EMLA) cream Apply to  affected area once 30 g 3  . LORazepam (ATIVAN) 0.5 MG tablet Take 1 tablet (0.5 mg total) by mouth every 6 (six) hours as needed (Nausea or vomiting). 30 tablet 0  . ondansetron (ZOFRAN) 8 MG tablet Take 1 tablet (8 mg total) by mouth 2 (two) times daily as needed. Start on the third day after chemotherapy. 30 tablet 1  . prochlorperazine (COMPAZINE) 10 MG tablet Take 1 tablet (10 mg total) by mouth every 6 (six) hours as needed (Nausea or vomiting). 30 tablet 1  . sodium fluoride (PREVIDENT 5000 PLUS) 1.1 % CREA dental cream Apply cream to tooth brush. Brush teeth for 2 minutes. Spit out excess. DO NOT rinse afterwards. Repeat nightly. (Patient taking differently: Place 1 application onto teeth 2 (two) times a day. ) 1 Tube prn   No current facility-administered medications for this visit.     PHYSICAL EXAMINATION: ECOG PERFORMANCE STATUS: 2 - Symptomatic, <50% confined to bed  Today's Vitals   03/17/19 1127 03/17/19 1133  BP: 108/71   Pulse: 68   Resp: 20   Temp: 98.9 F (37.2 C)   TempSrc: Temporal   SpO2: 100%  Weight: 152 lb 8 oz (69.2 kg)   Height: 5\' 2"  (1.575 m)   PainSc:  0-No pain   Body mass index is 27.89 kg/m.  Filed Weights   03/17/19 1127  Weight: 152 lb 8 oz (69.2 kg)    GENERAL: alert, no distress and comfortable SKIN: skin color, texture, turgor are normal, no rashes or significant lesions EYES: conjunctiva are pink and non-injected, sclera clear OROPHARYNX: Grade 2 mucositis, no definite oral candidiasis  NECK: supple, non-tender, tracheostomy in place  LYMPH:  no palpable lymphadenopathy in the cervical LUNGS: clear to auscultation with normal breathing effort HEART: regular rate & rhythm and no murmurs and no lower extremity edema ABDOMEN: soft, non-tender, non-distended, normal bowel sounds Musculoskeletal: no cyanosis of digits and no clubbing  PSYCH: alert & oriented x 3, fluent speech NEURO: no focal motor/sensory deficits  LABORATORY DATA:  I  have reviewed the data as listed    Component Value Date/Time   NA 135 03/17/2019 0953   K 5.1 03/17/2019 0953   CL 100 03/17/2019 0953   CO2 26 03/17/2019 0953   GLUCOSE 82 03/17/2019 0953   BUN 14 03/17/2019 0953   CREATININE 0.78 03/17/2019 0953   CALCIUM 9.6 03/17/2019 0953   PROT 6.9 02/17/2019 1000   ALBUMIN 3.4 (L) 02/17/2019 1000   AST 17 02/17/2019 1000   ALT 25 02/17/2019 1000   ALKPHOS 93 02/17/2019 1000   BILITOT 0.5 02/17/2019 1000   GFRNONAA >60 03/17/2019 0953   GFRAA >60 03/17/2019 0953    No results found for: SPEP, UPEP  Lab Results  Component Value Date   WBC 4.2 03/17/2019   NEUTROABS 2.7 03/17/2019   HGB 11.8 (L) 03/17/2019   HCT 35.8 (L) 03/17/2019   MCV 81.9 03/17/2019   PLT 205 03/17/2019      Chemistry      Component Value Date/Time   NA 135 03/17/2019 0953   K 5.1 03/17/2019 0953   CL 100 03/17/2019 0953   CO2 26 03/17/2019 0953   BUN 14 03/17/2019 0953   CREATININE 0.78 03/17/2019 0953      Component Value Date/Time   CALCIUM 9.6 03/17/2019 0953   ALKPHOS 93 02/17/2019 1000   AST 17 02/17/2019 1000   ALT 25 02/17/2019 1000   BILITOT 0.5 02/17/2019 1000

## 2019-03-10 ENCOUNTER — Inpatient Hospital Stay: Payer: Medicare HMO

## 2019-03-10 ENCOUNTER — Ambulatory Visit
Admission: RE | Admit: 2019-03-10 | Discharge: 2019-03-10 | Disposition: A | Discharge status: Home / Self Care | Payer: Medicare HMO | Source: Ambulatory Visit | Attending: Radiation Oncology | Admitting: Radiation Oncology

## 2019-03-10 ENCOUNTER — Encounter: Payer: Self-pay | Admitting: Hematology

## 2019-03-10 ENCOUNTER — Other Ambulatory Visit: Payer: Self-pay

## 2019-03-10 ENCOUNTER — Inpatient Hospital Stay (HOSPITAL_BASED_OUTPATIENT_CLINIC_OR_DEPARTMENT_OTHER): Payer: Medicare HMO | Admitting: Hematology

## 2019-03-10 VITALS — BP 108/70 | HR 81 | Temp 98.7°F | Resp 18 | Ht 62.0 in | Wt 155.4 lb

## 2019-03-10 DIAGNOSIS — J0410 Acute tracheitis without obstruction: Secondary | ICD-10-CM | POA: Diagnosis not present

## 2019-03-10 DIAGNOSIS — I700 Atherosclerosis of aorta: Secondary | ICD-10-CM | POA: Diagnosis not present

## 2019-03-10 DIAGNOSIS — F419 Anxiety disorder, unspecified: Secondary | ICD-10-CM

## 2019-03-10 DIAGNOSIS — E46 Unspecified protein-calorie malnutrition: Secondary | ICD-10-CM | POA: Diagnosis not present

## 2019-03-10 DIAGNOSIS — R042 Hemoptysis: Secondary | ICD-10-CM

## 2019-03-10 DIAGNOSIS — C321 Malignant neoplasm of supraglottis: Secondary | ICD-10-CM

## 2019-03-10 DIAGNOSIS — T451X5A Adverse effect of antineoplastic and immunosuppressive drugs, initial encounter: Secondary | ICD-10-CM | POA: Diagnosis not present

## 2019-03-10 DIAGNOSIS — K8020 Calculus of gallbladder without cholecystitis without obstruction: Secondary | ICD-10-CM

## 2019-03-10 DIAGNOSIS — D701 Agranulocytosis secondary to cancer chemotherapy: Secondary | ICD-10-CM | POA: Diagnosis not present

## 2019-03-10 DIAGNOSIS — R05 Cough: Secondary | ICD-10-CM

## 2019-03-10 DIAGNOSIS — Z5111 Encounter for antineoplastic chemotherapy: Secondary | ICD-10-CM | POA: Diagnosis not present

## 2019-03-10 DIAGNOSIS — K5900 Constipation, unspecified: Secondary | ICD-10-CM

## 2019-03-10 DIAGNOSIS — Z95828 Presence of other vascular implants and grafts: Secondary | ICD-10-CM

## 2019-03-10 DIAGNOSIS — Z510 Encounter for antineoplastic radiation therapy: Secondary | ICD-10-CM | POA: Diagnosis not present

## 2019-03-10 DIAGNOSIS — R109 Unspecified abdominal pain: Secondary | ICD-10-CM | POA: Diagnosis not present

## 2019-03-10 DIAGNOSIS — Z79899 Other long term (current) drug therapy: Secondary | ICD-10-CM

## 2019-03-10 DIAGNOSIS — D6481 Anemia due to antineoplastic chemotherapy: Secondary | ICD-10-CM

## 2019-03-10 DIAGNOSIS — K3189 Other diseases of stomach and duodenum: Secondary | ICD-10-CM | POA: Diagnosis not present

## 2019-03-10 LAB — CBC WITH DIFFERENTIAL (CANCER CENTER ONLY)
Abs Immature Granulocytes: 0.02 10*3/uL (ref 0.00–0.07)
Basophils Absolute: 0 10*3/uL (ref 0.0–0.1)
Basophils Relative: 0 %
Eosinophils Absolute: 0.2 10*3/uL (ref 0.0–0.5)
Eosinophils Relative: 4 %
HCT: 36.2 % (ref 36.0–46.0)
Hemoglobin: 11.8 g/dL — ABNORMAL LOW (ref 12.0–15.0)
Immature Granulocytes: 1 %
Lymphocytes Relative: 22 %
Lymphs Abs: 0.8 10*3/uL (ref 0.7–4.0)
MCH: 26.6 pg (ref 26.0–34.0)
MCHC: 32.6 g/dL (ref 30.0–36.0)
MCV: 81.7 fL (ref 80.0–100.0)
Monocytes Absolute: 0.5 10*3/uL (ref 0.1–1.0)
Monocytes Relative: 13 %
Neutro Abs: 2.3 10*3/uL (ref 1.7–7.7)
Neutrophils Relative %: 60 %
Platelet Count: 219 10*3/uL (ref 150–400)
RBC: 4.43 MIL/uL (ref 3.87–5.11)
RDW: 15.7 % — ABNORMAL HIGH (ref 11.5–15.5)
WBC Count: 3.8 10*3/uL — ABNORMAL LOW (ref 4.0–10.5)
nRBC: 0 % (ref 0.0–0.2)

## 2019-03-10 LAB — BASIC METABOLIC PANEL - CANCER CENTER ONLY
Anion gap: 9 (ref 5–15)
BUN: 17 mg/dL (ref 8–23)
CO2: 25 mmol/L (ref 22–32)
Calcium: 9.2 mg/dL (ref 8.9–10.3)
Chloride: 102 mmol/L (ref 98–111)
Creatinine: 0.73 mg/dL (ref 0.44–1.00)
GFR, Est AFR Am: >60 mL/min (ref >60)
GFR, Estimated: >60 mL/min (ref >60)
Glucose, Bld: 85 mg/dL (ref 70–99)
Potassium: 3.9 mmol/L (ref 3.5–5.1)
Sodium: 136 mmol/L (ref 135–145)

## 2019-03-10 LAB — MAGNESIUM: Magnesium: 2.3 mg/dL (ref 1.7–2.4)

## 2019-03-10 MED ORDER — DEXAMETHASONE 4 MG PO TABS: ORAL_TABLET | ORAL | 1 refills | Status: DC

## 2019-03-10 MED ORDER — SODIUM CHLORIDE 0.9% FLUSH
10.0000 mL | INTRAVENOUS | Status: DC | PRN
  Administered 2019-03-10: 10 mL
  Filled 2019-03-10: qty 10

## 2019-03-11 ENCOUNTER — Telehealth: Payer: Self-pay | Admitting: Hematology

## 2019-03-11 ENCOUNTER — Ambulatory Visit
Admission: RE | Admit: 2019-03-11 | Discharge: 2019-03-11 | Disposition: A | Discharge status: Home / Self Care | Payer: Medicare HMO | Source: Ambulatory Visit | Attending: Radiation Oncology | Admitting: Radiation Oncology

## 2019-03-11 ENCOUNTER — Inpatient Hospital Stay: Payer: Medicare HMO | Admitting: Nutrition

## 2019-03-11 ENCOUNTER — Inpatient Hospital Stay: Payer: Medicare HMO

## 2019-03-11 ENCOUNTER — Other Ambulatory Visit: Payer: Self-pay

## 2019-03-11 VITALS — BP 104/69 | HR 72 | Temp 98.9°F | Resp 20

## 2019-03-11 DIAGNOSIS — C321 Malignant neoplasm of supraglottis: Secondary | ICD-10-CM

## 2019-03-11 DIAGNOSIS — Z5111 Encounter for antineoplastic chemotherapy: Secondary | ICD-10-CM | POA: Diagnosis not present

## 2019-03-11 DIAGNOSIS — Z510 Encounter for antineoplastic radiation therapy: Secondary | ICD-10-CM | POA: Diagnosis not present

## 2019-03-11 MED ORDER — PALONOSETRON HCL INJECTION 0.25 MG/5ML
0.2500 mg | Freq: Once | INTRAVENOUS | Status: AC
  Administered 2019-03-11: 0.25 mg via INTRAVENOUS

## 2019-03-11 MED ORDER — PALONOSETRON HCL INJECTION 0.25 MG/5ML
INTRAVENOUS | Status: AC
  Filled 2019-03-11: qty 5

## 2019-03-11 MED ORDER — SODIUM CHLORIDE 0.9 % IV SOLN
40.0000 mg/m2 | Freq: Once | INTRAVENOUS | Status: AC
  Administered 2019-03-11: 72 mg via INTRAVENOUS
  Filled 2019-03-11: qty 72

## 2019-03-11 MED ORDER — HEPARIN SOD (PORK) LOCK FLUSH 100 UNIT/ML IV SOLN
500.0000 [IU] | Freq: Once | INTRAVENOUS | Status: AC | PRN
  Administered 2019-03-11: 500 [IU]
  Filled 2019-03-11: qty 5

## 2019-03-11 MED ORDER — SODIUM CHLORIDE 0.9% FLUSH
10.0000 mL | INTRAVENOUS | Status: DC | PRN
  Administered 2019-03-11: 10 mL
  Filled 2019-03-11: qty 10

## 2019-03-11 MED ORDER — SODIUM CHLORIDE 0.9 % IV SOLN
Freq: Once | INTRAVENOUS | Status: AC
  Administered 2019-03-11: 11:00:00 via INTRAVENOUS
  Filled 2019-03-11: qty 5

## 2019-03-11 MED ORDER — SODIUM CHLORIDE 0.9 % IV SOLN
Freq: Once | INTRAVENOUS | Status: AC
  Administered 2019-03-11: 08:00:00 via INTRAVENOUS
  Filled 2019-03-11: qty 250

## 2019-03-11 MED ORDER — POTASSIUM CHLORIDE 2 MEQ/ML IV SOLN
Freq: Once | INTRAVENOUS | Status: AC
  Administered 2019-03-11: 09:00:00 via INTRAVENOUS
  Filled 2019-03-11: qty 10

## 2019-03-11 NOTE — Patient Instructions (Signed)
[facility] Discharge Instructions for Patients Receiving Chemotherapy  Today you received the following chemotherapy agents Cisplatin  To help prevent nausea and vomiting after your treatment, we encourage you to take your nausea medication as directed by your MD.   If you develop nausea and vomiting that is not controlled by your nausea medication, call the clinic.   BELOW ARE SYMPTOMS THAT SHOULD BE REPORTED IMMEDIATELY:  *FEVER GREATER THAN 100.5 F  *CHILLS WITH OR WITHOUT FEVER  NAUSEA AND VOMITING THAT IS NOT CONTROLLED WITH YOUR NAUSEA MEDICATION  *UNUSUAL SHORTNESS OF BREATH  *UNUSUAL BRUISING OR BLEEDING  TENDERNESS IN MOUTH AND THROAT WITH OR WITHOUT PRESENCE OF ULCERS  *URINARY PROBLEMS  *BOWEL PROBLEMS  UNUSUAL RASH Items with * indicate a potential emergency and should be followed up as soon as possible.  Feel free to call the clinic should you have any questions or concerns. The clinic phone number is [phone].  Please show the [facility] at check-in to the Emergency Department and triage nurse.  Coronavirus (COVID-19) Are you at risk?  Are you at risk for the Coronavirus (COVID-19)?  To be considered HIGH RISK for Coronavirus (COVID-19), you have to meet the following criteria:  . Traveled to [place], [place], [place], [place] or [place]; or in the [place] to [place], [place], [place], or [place]; and have fever, cough, and shortness of breath within the last 2 weeks of travel OR . Been in close contact with a person diagnosed with COVID-19 within the last 2 weeks and have fever, cough, and shortness of breath . IF YOU DO NOT MEET THESE CRITERIA, YOU ARE CONSIDERED LOW RISK FOR COVID-19.  What to do if you are HIGH RISK for COVID-19?  Marland Kitchen If you are having a medical emergency, call 911. . Seek medical care right away. Before you go to a doctor's office, urgent care or emergency department, call ahead and tell them about  your recent travel, contact with someone diagnosed with COVID-19, and your symptoms. You should receive instructions from your physician's office regarding next steps of care.  . When you arrive at healthcare provider, tell the healthcare staff immediately you have returned from visiting [place], [place], [place], [place] or [place]; or traveled in the [place] to [place], [place], [place], or [place]; in the last two weeks or you have been in close contact with a person diagnosed with COVID-19 in the last 2 weeks.   . Tell the health care staff about your symptoms: fever, cough and shortness of breath. . After you have been seen by a medical provider, you will be either: o Tested for (COVID-19) and discharged home on quarantine except to seek medical care if symptoms worsen, and asked to  - Stay home and avoid contact with others until you get your results (4-5 days)  - Avoid travel on public transportation if possible (such as bus, train, or airplane) or o Sent to the Emergency Department by EMS for evaluation, COVID-19 testing, and possible admission depending on your condition and test results.  What to do if you are LOW RISK for COVID-19?  Reduce your risk of any infection by using the same precautions used for avoiding the common cold or flu:  Marland Kitchen Wash your hands often with soap and warm water for at least 20 seconds.  If soap and water are not readily available, use an alcohol-based hand sanitizer with at least 60% alcohol.  . If  coughing or sneezing, cover your mouth and nose by coughing or sneezing into the elbow areas of your shirt or coat, into a tissue or into your sleeve (not your hands). . Avoid shaking hands with others and consider head nods or verbal greetings only. . Avoid touching your eyes, nose, or mouth with unwashed hands.  . Avoid close contact with people who are sick. . Avoid places or events with large numbers of people in one location, like concerts or sporting  events. . Carefully consider travel plans you have or are making. . If you are planning any travel outside or inside the [place], visit the CDC's Travelers' Health webpage for the latest health notices. . If you have some symptoms but not all symptoms, continue to monitor at home and seek medical attention if your symptoms worsen. . If you are having a medical emergency, call 911.   [place] / e-Visit: [email or web address]         [facility]: [facility]: [phone]                   [facility]: [phone]

## 2019-03-11 NOTE — Telephone Encounter (Signed)
Per 6/17 los No change in appts

## 2019-03-11 NOTE — Progress Notes (Signed)
RD working remotely.  Nutrition follow up completed with patient during chemotherapy. Noted weight decreased to 155 pounds. Patient is tolerating 1-2 bottles Osmolite 1.5 via PEG. She continues to eat pureed foods and drink some liquids.  Nutrition Diagnosis: Unintentional weight loss continues.  Intervention: Educated patient to increase Osmolite 1.5 TID at meals with 60 mL free water before and after. Patient agreeable. Communicated this to Dickey as well.  Monitoring, Evaluation, Goals: Patient will tolerate adequate calories and protein to minimize weight loss.  Next Visit: Thursday, June 25, during infusion.

## 2019-03-12 ENCOUNTER — Other Ambulatory Visit: Payer: Self-pay

## 2019-03-12 ENCOUNTER — Ambulatory Visit
Admission: RE | Admit: 2019-03-12 | Discharge: 2019-03-12 | Disposition: A | Discharge status: Home / Self Care | Payer: Medicare HMO | Source: Ambulatory Visit | Attending: Radiation Oncology | Admitting: Radiation Oncology

## 2019-03-12 DIAGNOSIS — C321 Malignant neoplasm of supraglottis: Secondary | ICD-10-CM | POA: Diagnosis not present

## 2019-03-12 DIAGNOSIS — Z510 Encounter for antineoplastic radiation therapy: Secondary | ICD-10-CM | POA: Diagnosis not present

## 2019-03-15 ENCOUNTER — Ambulatory Visit
Admission: RE | Admit: 2019-03-15 | Discharge: 2019-03-15 | Disposition: A | Discharge status: Home / Self Care | Payer: Medicare HMO | Source: Ambulatory Visit | Attending: Radiation Oncology | Admitting: Radiation Oncology

## 2019-03-15 ENCOUNTER — Telehealth: Payer: Self-pay | Admitting: *Deleted

## 2019-03-15 ENCOUNTER — Other Ambulatory Visit: Payer: Self-pay

## 2019-03-15 DIAGNOSIS — Z510 Encounter for antineoplastic radiation therapy: Secondary | ICD-10-CM | POA: Diagnosis not present

## 2019-03-15 DIAGNOSIS — C321 Malignant neoplasm of supraglottis: Secondary | ICD-10-CM | POA: Diagnosis not present

## 2019-03-15 NOTE — Telephone Encounter (Signed)
Oncology Nurse Navigator Documentation  Spoke with Ms. Blok's partner, informed her arrangements have been made for her to receive Passey muir valve and instruction on Wed in conjunction with her MBSS.  She voiced understanding and appreciation.  Gayleen Orem, RN, BSN Head & Neck Oncology Nurse [place] at [place] [phone]

## 2019-03-16 ENCOUNTER — Other Ambulatory Visit: Payer: Self-pay | Admitting: Radiation Oncology

## 2019-03-16 ENCOUNTER — Other Ambulatory Visit: Payer: Self-pay | Admitting: *Deleted

## 2019-03-16 ENCOUNTER — Ambulatory Visit
Admission: RE | Admit: 2019-03-16 | Discharge: 2019-03-16 | Disposition: A | Discharge status: Home / Self Care | Payer: Medicare HMO | Source: Ambulatory Visit | Attending: Radiation Oncology | Admitting: Radiation Oncology

## 2019-03-16 ENCOUNTER — Telehealth: Payer: Self-pay | Admitting: *Deleted

## 2019-03-16 ENCOUNTER — Other Ambulatory Visit: Payer: Self-pay

## 2019-03-16 DIAGNOSIS — C321 Malignant neoplasm of supraglottis: Secondary | ICD-10-CM

## 2019-03-16 DIAGNOSIS — Z510 Encounter for antineoplastic radiation therapy: Secondary | ICD-10-CM | POA: Diagnosis not present

## 2019-03-16 MED ORDER — SONAFINE EX EMUL
1.0000 "application " | Freq: Two times a day (BID) | CUTANEOUS | Status: DC
  Administered 2019-03-16: 1 via TOPICAL

## 2019-03-16 MED ORDER — LIDOCAINE VISCOUS HCL 2 % MT SOLN: OROMUCOSAL | 5 refills | Status: DC

## 2019-03-16 NOTE — Telephone Encounter (Signed)
Oncology Nurse Navigator Documentation  Spoke with [name], pt's partner, informed her Makeya is to arrive [facility] 12:00 tomorrow immediately after appt with Dr. Maylon Peppers for PMV assessment with SLP Tammy.  She voiced understandingl  Gayleen Orem, RN, BSN Head & Neck Oncology Nurse [place] at [place] [phone]

## 2019-03-17 ENCOUNTER — Inpatient Hospital Stay (HOSPITAL_BASED_OUTPATIENT_CLINIC_OR_DEPARTMENT_OTHER): Payer: Medicare HMO | Admitting: Hematology

## 2019-03-17 ENCOUNTER — Ambulatory Visit (HOSPITAL_COMMUNITY)
Admission: RE | Admit: 2019-03-17 | Discharge: 2019-03-17 | Disposition: A | Discharge status: Home / Self Care | Payer: Medicare HMO | Source: Ambulatory Visit | Attending: Hematology | Admitting: Hematology

## 2019-03-17 ENCOUNTER — Inpatient Hospital Stay: Payer: Medicare HMO

## 2019-03-17 ENCOUNTER — Other Ambulatory Visit: Payer: Self-pay

## 2019-03-17 ENCOUNTER — Ambulatory Visit
Admission: RE | Admit: 2019-03-17 | Discharge: 2019-03-17 | Disposition: A | Discharge status: Home / Self Care | Payer: Medicare HMO | Source: Ambulatory Visit | Attending: Radiation Oncology | Admitting: Radiation Oncology

## 2019-03-17 ENCOUNTER — Encounter: Payer: Self-pay | Admitting: *Deleted

## 2019-03-17 ENCOUNTER — Encounter: Payer: Self-pay | Admitting: Hematology

## 2019-03-17 VITALS — BP 108/71 | HR 68 | Temp 98.9°F | Resp 20 | Ht 62.0 in | Wt 152.5 lb

## 2019-03-17 DIAGNOSIS — Z431 Encounter for attention to gastrostomy: Secondary | ICD-10-CM | POA: Diagnosis not present

## 2019-03-17 DIAGNOSIS — D6481 Anemia due to antineoplastic chemotherapy: Secondary | ICD-10-CM | POA: Diagnosis not present

## 2019-03-17 DIAGNOSIS — R109 Unspecified abdominal pain: Secondary | ICD-10-CM | POA: Diagnosis not present

## 2019-03-17 DIAGNOSIS — E46 Unspecified protein-calorie malnutrition: Secondary | ICD-10-CM

## 2019-03-17 DIAGNOSIS — R1310 Dysphagia, unspecified: Secondary | ICD-10-CM | POA: Insufficient documentation

## 2019-03-17 DIAGNOSIS — Z510 Encounter for antineoplastic radiation therapy: Secondary | ICD-10-CM | POA: Diagnosis not present

## 2019-03-17 DIAGNOSIS — Z930 Tracheostomy status: Secondary | ICD-10-CM | POA: Diagnosis not present

## 2019-03-17 DIAGNOSIS — C321 Malignant neoplasm of supraglottis: Secondary | ICD-10-CM

## 2019-03-17 DIAGNOSIS — Z95828 Presence of other vascular implants and grafts: Secondary | ICD-10-CM

## 2019-03-17 DIAGNOSIS — K1231 Oral mucositis (ulcerative) due to antineoplastic therapy: Secondary | ICD-10-CM | POA: Diagnosis not present

## 2019-03-17 DIAGNOSIS — Z5111 Encounter for antineoplastic chemotherapy: Secondary | ICD-10-CM | POA: Diagnosis not present

## 2019-03-17 DIAGNOSIS — Z87891 Personal history of nicotine dependence: Secondary | ICD-10-CM | POA: Diagnosis not present

## 2019-03-17 DIAGNOSIS — T451X5A Adverse effect of antineoplastic and immunosuppressive drugs, initial encounter: Secondary | ICD-10-CM

## 2019-03-17 LAB — BASIC METABOLIC PANEL - CANCER CENTER ONLY
Anion gap: 9 (ref 5–15)
BUN: 14 mg/dL (ref 8–23)
CO2: 26 mmol/L (ref 22–32)
Calcium: 9.6 mg/dL (ref 8.9–10.3)
Chloride: 100 mmol/L (ref 98–111)
Creatinine: 0.78 mg/dL (ref 0.44–1.00)
GFR, Est AFR Am: >60 mL/min (ref >60)
GFR, Estimated: >60 mL/min (ref >60)
Glucose, Bld: 82 mg/dL (ref 70–99)
Potassium: 5.1 mmol/L (ref 3.5–5.1)
Sodium: 135 mmol/L (ref 135–145)

## 2019-03-17 LAB — CBC WITH DIFFERENTIAL (CANCER CENTER ONLY)
Abs Immature Granulocytes: 0.01 10*3/uL (ref 0.00–0.07)
Basophils Absolute: 0 10*3/uL (ref 0.0–0.1)
Basophils Relative: 0 %
Eosinophils Absolute: 0 10*3/uL (ref 0.0–0.5)
Eosinophils Relative: 1 %
HCT: 35.8 % — ABNORMAL LOW (ref 36.0–46.0)
Hemoglobin: 11.8 g/dL — ABNORMAL LOW (ref 12.0–15.0)
Immature Granulocytes: 0 %
Lymphocytes Relative: 20 %
Lymphs Abs: 0.8 10*3/uL (ref 0.7–4.0)
MCH: 27 pg (ref 26.0–34.0)
MCHC: 33 g/dL (ref 30.0–36.0)
MCV: 81.9 fL (ref 80.0–100.0)
Monocytes Absolute: 0.6 10*3/uL (ref 0.1–1.0)
Monocytes Relative: 14 %
Neutro Abs: 2.7 10*3/uL (ref 1.7–7.7)
Neutrophils Relative %: 65 %
Platelet Count: 205 10*3/uL (ref 150–400)
RBC: 4.37 MIL/uL (ref 3.87–5.11)
RDW: 16.4 % — ABNORMAL HIGH (ref 11.5–15.5)
WBC Count: 4.2 10*3/uL (ref 4.0–10.5)
nRBC: 0 % (ref 0.0–0.2)

## 2019-03-17 LAB — MAGNESIUM: Magnesium: 2.3 mg/dL (ref 1.7–2.4)

## 2019-03-17 IMAGING — RF SWALLOWING FUNCTION
16 series · 24 of 24 positions shown · non-contrast
Comparison: Neck CT [DATE].

CLINICAL DATA: 67-year-old female undergoing treatment for
supraglottic laryngeal cancer including radiation. Tracheostomy.
Dysphagia. Cough.

EXAM:
MODIFIED BARIUM SWALLOW
TECHNIQUE: Different consistencies of barium were administered orally to the
patient by the Speech Pathologist. Imaging of the pharynx was
performed in the lateral projection. The radiologist was present in
the fluoroscopy room for this study, providing personal supervision.
FLUOROSCOPY TIME:  Fluoroscopy Time:  2 minutes 6 seconds
Radiation Exposure Index (if provided by the fluoroscopic device):
7.2 mGy
Number of Acquired Spot Images: 0

[Series 1: cp_standard · 0.34mm/px · 2 of 79 frames shown (1 of 16)]
[frame 12/79]
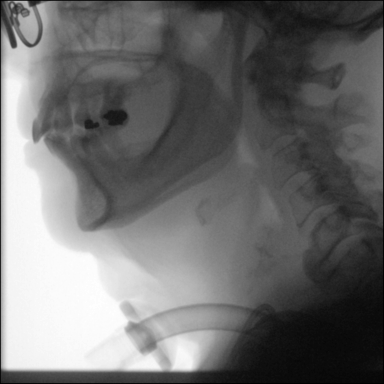
[frame 68/79]
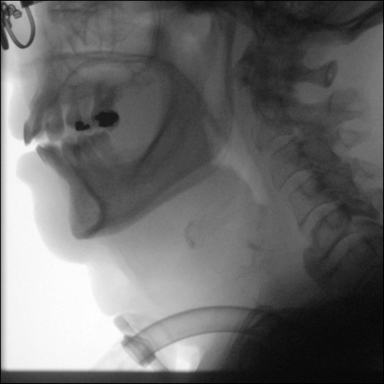

[Series 2: cp_standard · 0.34mm/px · 1 of 41 frames shown (2 of 16)]
[frame 21/41]
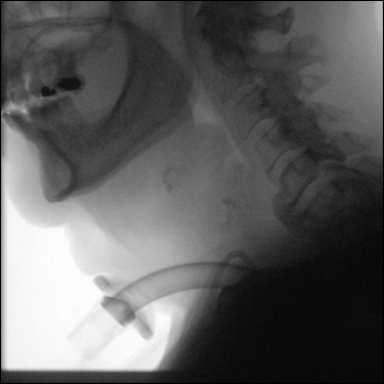

[Series 3: cp_standard · 0.34mm/px · 2 of 42 frames shown (3 of 16)]
[frame 7/42]
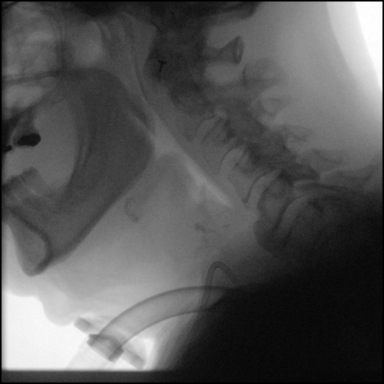
[frame 36/42]
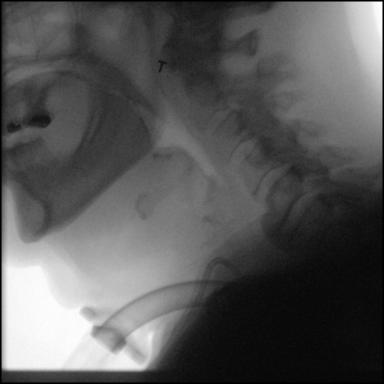

[Series 4: cp_standard · 0.34mm/px · 1 of 172 frames shown (4 of 16)]
[frame 87/172]
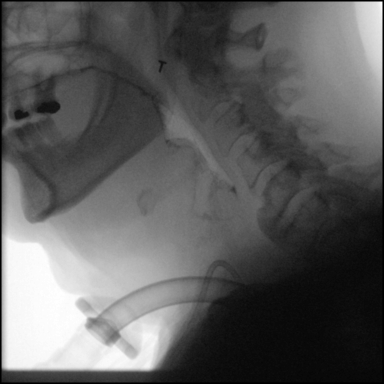

[Series 5: cp_standard · 0.34mm/px · 2 of 44 frames shown (5 of 16)]
[frame 7/44]
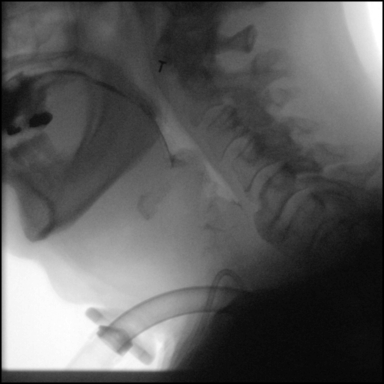
[frame 38/44]
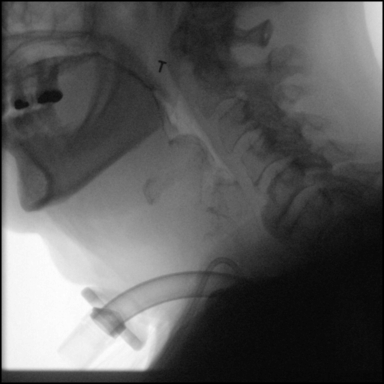

[Series 6: cp_standard · 0.34mm/px · 1 of 57 frames shown (6 of 16)]
[frame 34/57]
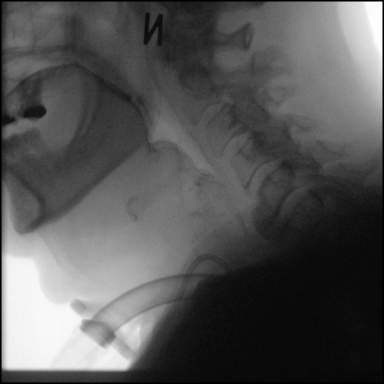

[Series 7: cp_standard · 0.34mm/px · 2 of 90 frames shown (7 of 16)]
[frame 46/90]
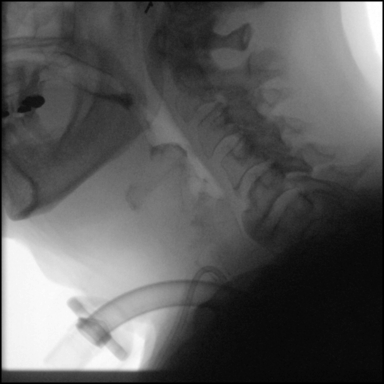
[frame 77/90]
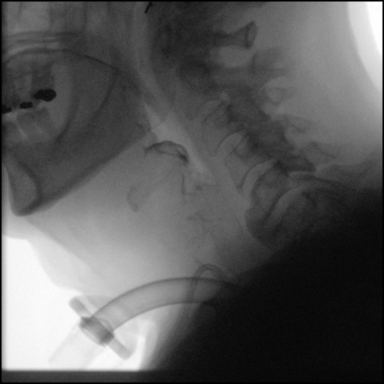

[Series 8: cp_standard · 0.34mm/px · 1 of 64 frames shown (8 of 16)]
[frame 40/64]
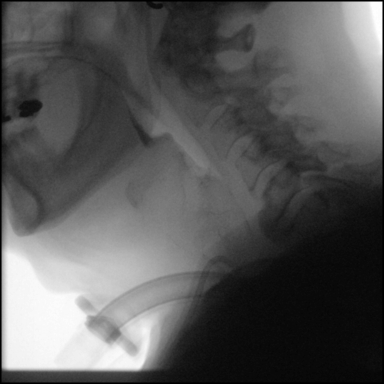

[Series 9: cp_standard · 0.34mm/px · 1 of 97 frames shown (9 of 16)]
[frame 15/97]
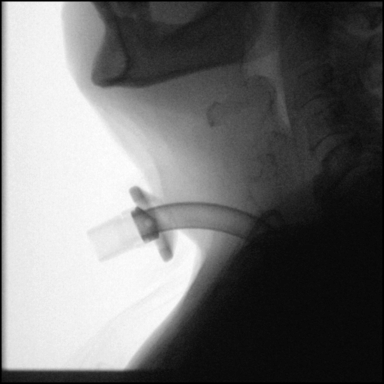

[Series 10: cp_standard · 0.34mm/px · 2 of 91 frames shown (10 of 16)]
[frame 1/91]
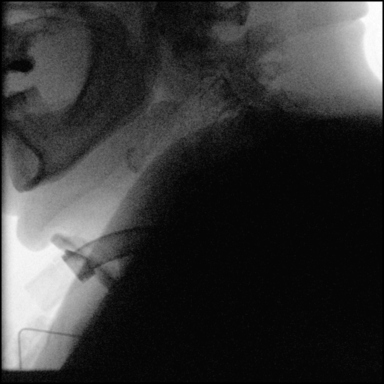
[frame 46/91]
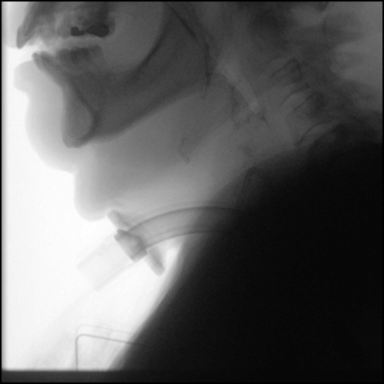

[Series 11: cp_standard · 0.34mm/px · 1 of 84 frames shown (11 of 16)]
[frame 43/84]
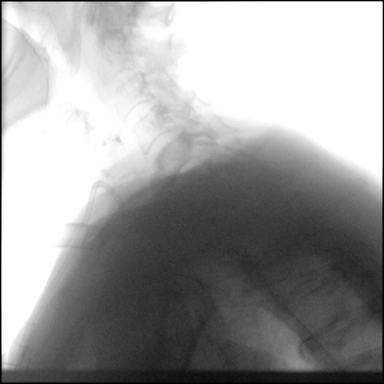

[Series 12: cp_standard · 0.34mm/px · 2 of 91 frames shown (12 of 16)]
[frame 14/91]
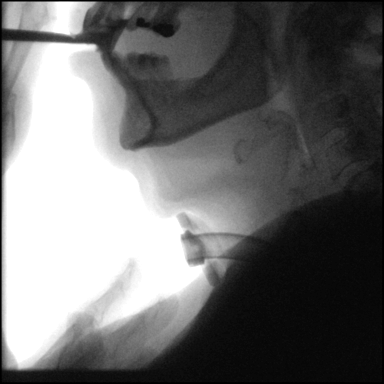
[frame 78/91]
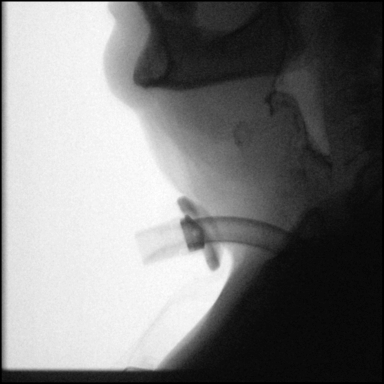

[Series 13: cp_standard · 0.34mm/px · 1 of 19 frames shown (13 of 16)]
[frame 10/19]
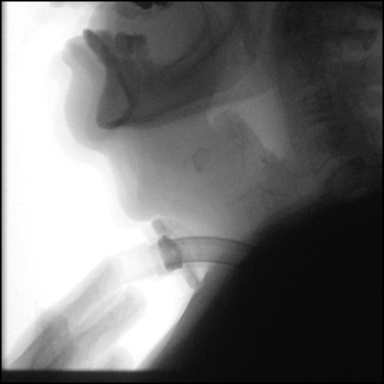

[Series 14: cp_standard · 0.34mm/px · 2 of 213 frames shown (14 of 16)]
[frame 1/213]
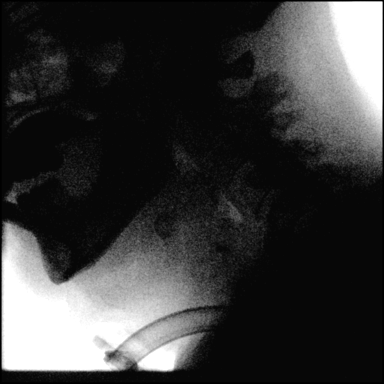
[frame 182/213]
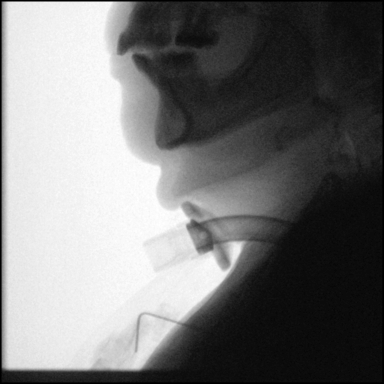

[Series 15: cp_standard · 0.34mm/px · 1 of 74 frames shown (15 of 16)]
[frame 38/74]
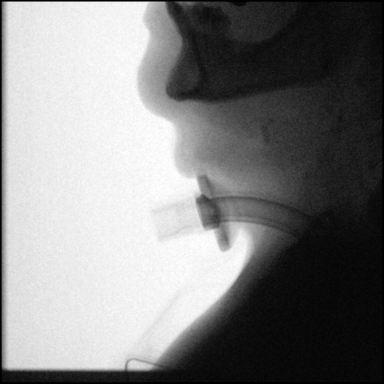

[Series 16: cp_standard · 0.34mm/px · 2 of 124 frames shown (16 of 16)]
[frame 19/124]
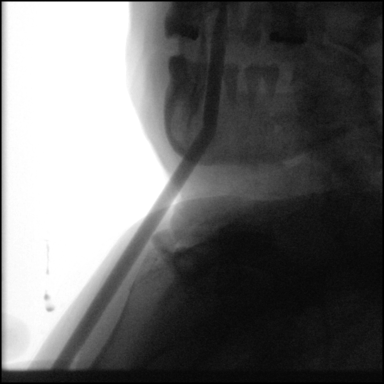
[frame 106/124]
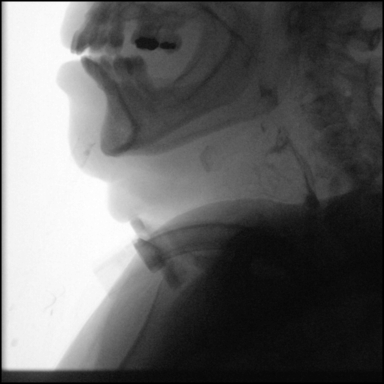

[24 of 24 positions shown; findings below may reference images not displayed]

FINDINGS: Tracheostomy in place, and evidence of abundant hypopharynx and
larynx edema.

Flash penetration with trace aspiration, which slowly became
apparent over the course of the study due to faint contrast staining
of the ventral trachea, occurred with thin liquids.

Only mild retention noted otherwise.
IMPRESSION: Trace aspiration which became slowly apparent over the course of the
study.

Please refer to the Speech Pathologists report for complete details
and recommendations.

## 2019-03-17 MED ORDER — ALTEPLASE 2 MG IJ SOLR
2.0000 mg | Freq: Once | INTRAMUSCULAR | Status: AC | PRN
  Administered 2019-03-17: 2 mg
  Filled 2019-03-17: qty 2

## 2019-03-17 MED ORDER — ALTEPLASE 2 MG IJ SOLR
INTRAMUSCULAR | Status: AC
  Filled 2019-03-17: qty 2

## 2019-03-17 MED ORDER — HEPARIN SOD (PORK) LOCK FLUSH 100 UNIT/ML IV SOLN
500.0000 [IU] | Freq: Once | INTRAVENOUS | Status: AC | PRN
  Administered 2019-03-17: 500 [IU]
  Filled 2019-03-17: qty 5

## 2019-03-17 MED ORDER — SODIUM CHLORIDE 0.9% FLUSH
10.0000 mL | INTRAVENOUS | Status: DC | PRN
  Administered 2019-03-17: 10 mL
  Filled 2019-03-17: qty 10

## 2019-03-17 NOTE — Therapy (Signed)
Passy-Muir Speaking Valve - Evaluation Patient Details  Name: [name] [name] MRN: [id number] Date of Birth: 12/04/1950  Today's Date: 03/17/2019 Time: 9381-8299 SLP Time Calculation (min) (ACUTE ONLY): 59 min  Past Medical History:  Past Medical History:  Diagnosis Date  . Cataract   . Laryngeal cancer ([place]) 11/2018  . Thyroid disease    Past Surgical History:  Past Surgical History:  Procedure Laterality Date  . CATARACT EXTRACTION, BILATERAL  left 05/20/16, right 06/03/16   Cataract Extraction w/ IOL Implant; Surgeon: Sudie Grumbling, MD; Location: [facility]; Service: Ophthalmology   . CESAREAN SECTION    . ESOPHAGOGASTRODUODENOSCOPY (EGD) WITH PROPOFOL N/A 02/01/2019   Procedure: ESOPHAGOGASTRODUODENOSCOPY (EGD) WITH PROPOFOL;  Surgeon: Milus Banister, MD;  Location: [facility];  Service: Endoscopy;  Laterality: N/A;  . EUS N/A 02/01/2019   Procedure: UPPER ENDOSCOPIC ULTRASOUND (EUS) RADIAL-Will need Linear and Forward Viewing EUS Scope;  Surgeon: Milus Banister, MD;  Location: [facility];  Service: Endoscopy;  Laterality: N/A;  . FINE NEEDLE ASPIRATION N/A 02/01/2019   Procedure: FINE NEEDLE ASPIRATION (FNA) LINEAR;  Surgeon: Milus Banister, MD;  Location: [facility];  Service: Endoscopy;  Laterality: N/A;  . IR GASTROSTOMY TUBE MOD SED  02/09/2019  . IR IMAGING GUIDED PORT INSERTION  02/02/2019  . KNEE CARTILAGE SURGERY Right 2019  . TRACHEOSTOMY  12/02/2018   [facility]   HPI:  Pt is a 68 yo female referred for MBS due to h/o supraglottic cancer - diagnosed March 2020 after pt found to have respiratory distress.  She required emergent trach - and CT imaging showed supraglottic laryngeal carcinoma extending across anterior commissure and invading the prelaryngeal fat, left aryepiglottic fold and left aspect of epiglottis with severe nararowing of laryngeal airway and level 2A bilateral subcentimeter cervical lymph nodes.  Pt required PEG tube at that time and did not  tolerate a PMSV due to back pressure and increased dyspnea when valve placed in March 2020.  Currently undergoing chemoradiation - radiation 5 x's week and chemo on Thursdays.  Pt reports 2 more weeks of radiation.  She has been consuming watered down pureed foods and liquids including Boost, etc.  She denies pneumonias nor requiring heimlich manuever. MBS indicated.     Assessment / Plan / Recommendation Clinical Impression  Pt referred for PMSV given she has a trach due to laryngo-pharyngeal cancer.   She is able to obtain adequate phonation with finger occlusion - despite possibly having a "paralyzed left vocal cord" when at [facility] per records.  Baseline vital taken with RR 24, oxygen saturation 92, HR 76. Pt did not demonstrate overt outward indication of vital changes but not able to continuously monitor given she mobilized frequently to the bathroom and required significant effort to try to clear secretions during session.   Upon placement of PMSV, pt reported sensing "airway blockage" within 2 minutes.  Upon removal of valve, she presented with copious coughing and attempts at expectorating secretions from trachea using saline bullet and suction.  She also removed inner cannula and cleaned it throughout in the bathroom.  After pt returned cleaned inner cannula -- she continued coughing. She reports this occurs every day at about this time after radiation treatment.   Coughing continued for approximately 25 minutes (clearly fatiguing to pt) during which time SLP requested to xray staff to contact IR RN to suction pt's trach.  Pt agreeable to plan.  IR RN arrived and suctioned pt's trach with removal of blood tinged 1/2-inch  clot.    Pt then able to wear PMSV for approximately 3 minutes prior to reporting difficulty with breathing reporting increase in retained secretions.  Instruction to "hock" and expectorate were helpful to clear x3 during PMSV trial.  No significant breath stacking noted during  frequent checks - however pt's copious secretions and pharyngeal/laryngeal edema are likely impacting functional use of the PMSV.    Upon final removal of PMSV, pt coughed and expectorated viscous moderate amount (approx 1 tbsp) of secretions from trach.  Given her keen awareness of her respiratory system, self-management of trach including using a portable suction that she carries with her- SLP provided pt with PMSV to use as desire/tolerate.  Discussed other physiological benefits of valve use including improved secretion management.   Hopeful for decreased pharyngeal and laryngeal edema and amount of secretions a few weeks after radiation is concluded to allow improved tolerance of PMSV.  Pt returned demonstration to donning and removal of valve and reports "Oh I'll use it" when encouraged to use her valve as tolerate.    Informed her of anticipation for improved tolerance a few weeks after radiation when edema decreases/secretion management improves.  Pt reports comfort with PMSV use and reports being excited to use it.    Thank you so much for this PMSV referral to help enhance pt's ease of communication, phonation, hygiene and QOL with use of valve.      SLP Visit Diagnosis: Aphonia (R49.1)    SLP Assessment  Patient needs continued Speech Lanaguage Pathology Services    Follow Up Recommendations    Follow up with OP SLP   Frequency and Duration   Continue prior frequency established by primary SLP      PMSV Trial PMSV was placed for: communication Able to redirect subglottic air through upper airway: Yes Pt finger occludes trach routinely for phonation Able to Attain Phonation: Yes Voice Quality: Normal Able to Expectorate Secretions: Yes Level of Secretion Expectoration with PMSV: Oral Breath Support for Phonation: Adequate Intelligibility: Intelligible Respirations During Trial: 24 SpO2 During Trial: 98 % Pulse During Trial: 76 Behavior: Alert;Expresses self well    Tracheostomy Tube  Additional Tracheostomy Tube Assessment Secretion Description: blood tinged secretions Level of Secretion Expectoration: Tracheal;Oral    Vent Dependency    n/a   Cuff Deflation Trial  GO Behavior: Alert;Expresses self well;Good eye contact        Macario Golds 03/17/2019, 5:25 PM   Luanna Salk, [place] [facility] SLP Acute Rehab Services Pager [phone] Office [phone]

## 2019-03-18 ENCOUNTER — Inpatient Hospital Stay: Payer: Medicare HMO | Admitting: Nutrition

## 2019-03-18 ENCOUNTER — Inpatient Hospital Stay: Payer: Medicare HMO

## 2019-03-18 ENCOUNTER — Telehealth: Payer: Self-pay | Admitting: Hematology

## 2019-03-18 ENCOUNTER — Ambulatory Visit
Admission: RE | Admit: 2019-03-18 | Discharge: 2019-03-18 | Disposition: A | Discharge status: Home / Self Care | Payer: Medicare HMO | Source: Ambulatory Visit | Attending: Radiation Oncology | Admitting: Radiation Oncology

## 2019-03-18 ENCOUNTER — Other Ambulatory Visit: Payer: Self-pay

## 2019-03-18 VITALS — BP 104/61 | HR 71 | Temp 98.9°F | Resp 20

## 2019-03-18 DIAGNOSIS — Z510 Encounter for antineoplastic radiation therapy: Secondary | ICD-10-CM | POA: Diagnosis not present

## 2019-03-18 DIAGNOSIS — C321 Malignant neoplasm of supraglottis: Secondary | ICD-10-CM

## 2019-03-18 DIAGNOSIS — Z5111 Encounter for antineoplastic chemotherapy: Secondary | ICD-10-CM | POA: Diagnosis not present

## 2019-03-18 MED ORDER — HEPARIN SOD (PORK) LOCK FLUSH 100 UNIT/ML IV SOLN
500.0000 [IU] | Freq: Once | INTRAVENOUS | Status: AC | PRN
  Administered 2019-03-18: 15:00:00 500 [IU]
  Filled 2019-03-18: qty 5

## 2019-03-18 MED ORDER — SODIUM CHLORIDE 0.9% FLUSH
10.0000 mL | INTRAVENOUS | Status: DC | PRN
  Administered 2019-03-18: 15:00:00 10 mL
  Filled 2019-03-18: qty 10

## 2019-03-18 MED ORDER — MAGNESIUM SULFATE 50 % IJ SOLN
Freq: Once | INTRAVENOUS | Status: AC
  Administered 2019-03-18: 09:00:00 via INTRAVENOUS
  Filled 2019-03-18: qty 1000

## 2019-03-18 MED ORDER — PALONOSETRON HCL INJECTION 0.25 MG/5ML
0.2500 mg | Freq: Once | INTRAVENOUS | Status: AC
  Administered 2019-03-18: 0.25 mg via INTRAVENOUS

## 2019-03-18 MED ORDER — SODIUM CHLORIDE 0.9 % IV SOLN
40.0000 mg/m2 | Freq: Once | INTRAVENOUS | Status: AC
  Administered 2019-03-18: 12:00:00 72 mg via INTRAVENOUS
  Filled 2019-03-18: qty 72

## 2019-03-18 MED ORDER — SODIUM CHLORIDE 0.9 % IV SOLN
Freq: Once | INTRAVENOUS | Status: AC
  Administered 2019-03-18: 08:00:00 via INTRAVENOUS
  Filled 2019-03-18: qty 250

## 2019-03-18 MED ORDER — SODIUM CHLORIDE 0.9 % IV SOLN
Freq: Once | INTRAVENOUS | Status: AC
  Administered 2019-03-18: 11:00:00 via INTRAVENOUS
  Filled 2019-03-18: qty 5

## 2019-03-18 MED ORDER — PALONOSETRON HCL INJECTION 0.25 MG/5ML
INTRAVENOUS | Status: AC
  Filled 2019-03-18: qty 5

## 2019-03-18 NOTE — Patient Instructions (Signed)
[facility] Discharge Instructions for Patients Receiving Chemotherapy  Today you received the following chemotherapy agents Cisplatin  To help prevent nausea and vomiting after your treatment, we encourage you to take your nausea medication as directed  If you develop nausea and vomiting that is not controlled by your nausea medication, call the clinic.   BELOW ARE SYMPTOMS THAT SHOULD BE REPORTED IMMEDIATELY:  *FEVER GREATER THAN 100.5 F  *CHILLS WITH OR WITHOUT FEVER  NAUSEA AND VOMITING THAT IS NOT CONTROLLED WITH YOUR NAUSEA MEDICATION  *UNUSUAL SHORTNESS OF BREATH  *UNUSUAL BRUISING OR BLEEDING  TENDERNESS IN MOUTH AND THROAT WITH OR WITHOUT PRESENCE OF ULCERS  *URINARY PROBLEMS  *BOWEL PROBLEMS  UNUSUAL RASH Items with * indicate a potential emergency and should be followed up as soon as possible.  Feel free to call the clinic should you have any questions or concerns. The clinic phone number is [phone].  Please show the CHEMO ALERT CARD at check-in to the Emergency Department and triage nurse.   

## 2019-03-18 NOTE — Telephone Encounter (Signed)
Per 6/24 los No change in appts

## 2019-03-18 NOTE — Progress Notes (Signed)
RD working remotely.  Nutrition follow-up completed with [name] [name] over the telephone. Patient has been experiencing worsening bloating and diarrhea when using Osmolite 1.5.  Apparently this has happened the last 3 or 4 days. She is still eating pured foods and drinking liquids. Weight continues to decrease and was documented as 152.5 pounds on June 24. Patient has not increased tube feeding secondary to side effects.  Nutrition diagnosis: Unintentional weight loss continues.  Intervention: I will provide samples of a different enteral formula for patient to try. Patient will be given Dillard Essex and liquid hope to try. Will instruct patient on how to infuse products. Goal will be to increase tube feeding to 3-4 times a day.  Monitoring, evaluation, goals: Patient will tolerate adequate calories and protein to minimize further weight loss.  Next visit: Monday, June 29.

## 2019-03-19 ENCOUNTER — Other Ambulatory Visit: Payer: Self-pay | Admitting: Hematology

## 2019-03-19 ENCOUNTER — Other Ambulatory Visit: Payer: Self-pay

## 2019-03-19 ENCOUNTER — Ambulatory Visit
Admission: RE | Admit: 2019-03-19 | Discharge: 2019-03-19 | Disposition: A | Discharge status: Home / Self Care | Payer: Medicare HMO | Source: Ambulatory Visit | Attending: Radiation Oncology | Admitting: Radiation Oncology

## 2019-03-19 DIAGNOSIS — C321 Malignant neoplasm of supraglottis: Secondary | ICD-10-CM

## 2019-03-19 DIAGNOSIS — Z510 Encounter for antineoplastic radiation therapy: Secondary | ICD-10-CM | POA: Diagnosis not present

## 2019-03-19 MED ORDER — LORAZEPAM 0.5 MG PO TABS: 0.5000 mg | ORAL_TABLET | Freq: Four times a day (QID) | ORAL | 0 refills | Status: DC | PRN

## 2019-03-22 ENCOUNTER — Inpatient Hospital Stay: Payer: Medicare HMO | Admitting: Nutrition

## 2019-03-22 ENCOUNTER — Other Ambulatory Visit: Payer: Self-pay

## 2019-03-22 ENCOUNTER — Ambulatory Visit
Admission: RE | Admit: 2019-03-22 | Discharge: 2019-03-22 | Disposition: A | Discharge status: Home / Self Care | Payer: Medicare HMO | Source: Ambulatory Visit | Attending: Radiation Oncology | Admitting: Radiation Oncology

## 2019-03-22 DIAGNOSIS — Z430 Encounter for attention to tracheostomy: Secondary | ICD-10-CM | POA: Diagnosis not present

## 2019-03-22 DIAGNOSIS — Z510 Encounter for antineoplastic radiation therapy: Secondary | ICD-10-CM | POA: Diagnosis not present

## 2019-03-22 DIAGNOSIS — J383 Other diseases of vocal cords: Secondary | ICD-10-CM | POA: Diagnosis not present

## 2019-03-22 DIAGNOSIS — C321 Malignant neoplasm of supraglottis: Secondary | ICD-10-CM | POA: Diagnosis not present

## 2019-03-22 DIAGNOSIS — J387 Other diseases of larynx: Secondary | ICD-10-CM | POA: Diagnosis not present

## 2019-03-22 NOTE — Progress Notes (Signed)
Oncology Nurse Navigator Documentation  Met [name] [name] s/p appt with Dr. Maylon Peppers, escorted her to [facility] for PMV and MBSS appts.  Gayleen Orem, RN, BSN Head & Neck Oncology Nurse [place] at [place] [phone]

## 2019-03-22 NOTE — Progress Notes (Signed)
RD working remotely.  Patient continues chemotherapy and radiation for Larynx cancer. Nutrition follow up completed with [facility]. She reports patient tolerated Dillard Essex without bloating or diarrhea. Patient still refusing to do more than 2 bottles daily despite weight loss.  Last weight 152.5 pounds on June 24, decreased from 161.9 pounds on May 20.  Nutrition Diagnosis: Unintentional weight loss cannot be evaluated as no new weight from last visit.  Estimated Nutrition Needs: 2000-2200 kcal, 100-115 grams protein, 2 L fluid.  Intervention: Educated to increase to 3 cartons daily via G-tube. Will arrange for additional samples and order to be placed for delivery.  Monitoring, evaluation, Goals: Patient will tolerate increased calories and protein to minimize further weight loss.  Next Visit: Thursday, July 2. Sooner if needed.

## 2019-03-22 NOTE — Progress Notes (Signed)
[place] OFFICE PROGRESS NOTE  Patient Care Team: Philmore Pali, NP as PCP - General (Nurse Practitioner) Eppie Gibson, MD as Attending Physician (Radiation Oncology) Tish Men, MD as Consulting Physician (Hematology) Leota Sauers, RN as Oncology Nurse Navigator Karie Mainland, RD as Dietitian (Nutrition) Sharen Counter, CCC-SLP as Speech Language Pathologist (Speech Pathology)  HEME/ONC OVERVIEW: 1.  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 -11/2018:   3.1 x 2.8cm left supraglottic laryngeal mass extending across the anterior commissure and invading the prelaryngeal fat and suspicious <1cm bilateral Level II cervical LN's on CT  Transferred to [facility] for airway compromise and taken to OR for tracheostomy; multiple bx'es non-diagnostic -12/2018: repeat DL with bx of the supraglottic mass showed well-differentiated invasive squamous cell carcinoma  -01/2019: PET showed locally advanced disease, including bilateral cervical LN involvement; mildly FDG-avid mediastinal LN's, bx negative for malignancy -Mid-01/2019 - present: definitive chemoradiation with weekly cisplatin   2. 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 -S/p 5 doses of weekly cisplatin concurrent with RT in the definitive setting -Labs adequate, proceed with Cycle 6 of chemotherapy -Plan for 7 cycles of chemotherapy  -PRN anti-emetics: Zofran, Compazine, Ativan and dexamethasone   Chemotherapy-associated anemia -Secondary to chemotherapy -Hgb 10.7, lower than last week  -Patient denies any symptom of bleeding -We will monitor for now; no indication for dose adjustment  Chemotherapy-associated leukopenia -Secondary to chemotherapy -WBC 3.0k  with [place] 2100, slightly lower than last week -Patient denies any symptoms of infection -We will monitor for now  Chemotherapy-associated thrombocytopenia -Secondary to chemotherapy -Plts 146k, lower than last week -Patient denies any symptoms of bleeding or excess bruising, such as epistaxis, hematochezia, melena, or hematuria -We will monitor for now  Hyponatremia  -Na 133, patient is asymptomatic -She has had occasional mild hyponatremia -We will monitor it for now  Chemotherapy-associated mucositis -Secondary to chemotherapy -Current regimen: IR liquid morphine 10mg /0.62mL q4hrs PRN, viscous lidocaine swish and swallow PRN and salt/soda rinses  -Pain relatively well controlled -Continue the regimen above  Protein malnutrition -Secondary to chemoradiation -Weight down by 2 lbs since the last visit  -Currently taking 3 cans of tube feeding and 8 bottles of 8oz water per day via PEG as well as 3 bottles of Boosts and some cereal by mouth -I encouraged the patient to maintain adequate PO intake as tolerated, and to adhere to nutritional recommendations   No orders of the defined types were placed in this encounter.  All questions were answered. The patient knows to call the clinic with any problems, questions or concerns. No barriers to learning was detected.  Return in 1 week for labs, port flush, clinic appointment, and Cycle 7 of weekly cisplatin.  Tish Men, MD 03/24/2019 10:23 AM  CHIEF COMPLAINT: "I am doing well so far  INTERVAL HISTORY: Mr. [name] [name] returns to clinic for follow-up of squamous cell carcinoma of the supraglottic larynx on definitive chemoradiation with weekly cisplatin.  She reports that she still has intermittent small dried blood clot via her tracheostomy tube in the morning, which she uses saline drops to loosen the clot.  She denies any recurrent hemoptysis during the day.  She is administering at least 3 cans of tube feeding at 8  bottles of 8 ounce water  per day via the feeding tube, as well as 3 bottles of boosts and some cereal by mouth.  She reports some pain in the throat, which she takes IR liquid morphine once a day with adequate relief of the pain.  She denies any nausea, vomiting, or diarrhea.  She denies any other complaint today.  SUMMARY OF ONCOLOGIC HISTORY: Oncology History  Malignant neoplasm of supraglottis ([name] [name])  12/02/2018 Imaging   CT neck: IMPRESSION: 1. Supraglottic laryngeal carcinoma extending across the anterior commissure and invading the prelaryngeal fat, left aryepiglottic fold and left aspect of the epiglottis. 2. Severe narrowing of the laryngeal airway. 3. Bilateral subcentimeter level 2A and 2A cervical lymph nodes.  Critical Value/emergent results were called by telephone at the time of interpretation on 12/02/2018 at 5:34 pm to Dr. Collier Salina, who verbally acknowledged these results.   12/02/2018 Imaging   CT  Chest:  IMPRESSION: No evidence of metastatic disease in the chest.   12/03/2018 Pathology Results   (Care Everywhere; [facility]) A: Larynx, supraglottis, biopsy - Atypical squamous proliferation with at least low grade dysplasia and abundant keratinization - No definite high grade dysplasia or invasive carcinoma represented (see comment)   12/18/2018 Pathology Results   (Care Everywhere; [facility]) A:  Neck, left, fine needle aspiration - Rare single atypical cells, no cells diagnostic of malignancy - Lymph node material present  B:  Neck, right, fine needle aspiration - Non-diagnostic specimen - No lymph node material identified - Mixed inflammation and blood   12/31/2018 Pathology Results   (Care Everywhere; [facility])  A: Larynx, left supraglottic mass, biopsy - Atypical squamous proliferation with abundant keratinization, suspicious for well-differentiated invasive squamous cell carcinoma  B: Larynx, left supraglottic mass, biopsy - Well-differentiated invasive squamous  cell carcinoma with abundant keratinization (see comment)   01/18/2019 Initial Diagnosis   Laryngeal cancer ([place])   01/26/2019 Imaging   PET: IMPRESSION: 1. Locally advanced hypermetabolic laryngeal neoplasm centered in the left supraglottic space with bilateral glottic and epiglottic involvement and subglottic extension on the left. 2. Hypermetabolic bilateral level 3 neck nodal metastases. 3. Hypermetabolic high mediastinal nodal metastases between the trachea and upper thoracic esophagus. 4. Otherwise no distant hypermetabolic metastatic disease. 5. Nodular 7 mm focus at the left lung base, below PET resolution, recommend attention on follow-up chest CT in 3 months. 6. Chronic findings include: Aortic Atherosclerosis (ICD10-I70.0). Cholelithiasis.   02/08/2019 Cancer Staging   Staging form: Larynx - Supraglottis, AJCC 8th Edition - Clinical stage from 02/08/2019: Stage IVA (cT3, cN2c, cM0) - Signed by Eppie Gibson, MD on 02/08/2019   02/11/2019 -  Chemotherapy   The patient had palonosetron (ALOXI) injection 0.25 mg, 0.25 mg, Intravenous,  Once, 5 of 7 cycles Administration: 0.25 mg (02/11/2019), 0.25 mg (02/18/2019), 0.25 mg (03/04/2019), 0.25 mg (03/11/2019), 0.25 mg (03/18/2019) CISplatin (PLATINOL) 72 mg in sodium chloride 0.9 % 250 mL chemo infusion, 40 mg/m2 = 72 mg, Intravenous,  Once, 5 of 7 cycles Administration: 72 mg (02/11/2019), 72 mg (02/18/2019), 72 mg (03/04/2019), 72 mg (03/11/2019), 72 mg (03/18/2019) fosaprepitant (EMEND) 150 mg, dexamethasone (DECADRON) 12 mg in sodium chloride 0.9 % 145 mL IVPB, , Intravenous,  Once, 5 of 7 cycles Administration:  (02/11/2019),  (02/18/2019),  (03/04/2019),  (03/11/2019),  (03/18/2019)  for chemotherapy treatment.      REVIEW OF SYSTEMS:   Constitutional: ( - ) fevers, ( - )  chills , ( - ) night sweats Eyes: ( - ) blurriness  of vision, ( - ) double vision, ( - ) watery eyes Ears, nose, mouth, throat, and face: ( + ) mucositis, ( - ) sore  throat Respiratory: ( - ) cough, ( - ) dyspnea, ( - ) wheezes Cardiovascular: ( - ) palpitation, ( - ) chest discomfort, ( - ) lower extremity swelling Gastrointestinal:  ( - ) nausea, ( - ) heartburn, ( - ) change in bowel habits Skin: ( - ) abnormal skin rashes Lymphatics: ( - ) new lymphadenopathy, ( - ) easy bruising Neurological: ( - ) numbness, ( - ) tingling, ( - ) new weaknesses Behavioral/Psych: ( - ) mood change, ( - ) new changes  All other systems were reviewed with the patient and are negative.  I have reviewed the past medical history, past surgical history, social history and family history with the patient and they are unchanged from previous note.  ALLERGIES:  has No Known Allergies.  MEDICATIONS:  Current Outpatient Medications  Medication Sig Dispense Refill  . chlorhexidine (PERIDEX) 0.12 % solution Rinse with 15 mls twice daily for 30 seconds. Use after breakfast and at bedtime. Spit out excess. Do not swallow. (Patient taking differently: Use as directed 15 mLs in the mouth or throat 2 (two) times daily. Rinse with 15 mls twice daily for 30 seconds. Use after breakfast and at bedtime. Spit out excess. Do not swallow.) 480 mL prn  . dexamethasone (DECADRON) 4 MG tablet Take 2 tablets by mouth once a day on the day after chemotherapy and then take 2 tablets two times a day for 2 days. Take with food. 30 tablet 1  . docusate sodium (COLACE) 100 MG capsule Take 100 mg by mouth 2 (two) times daily.    Marland Kitchen HYDROcodone-acetaminophen (HYCET) 7.5-325 mg/15 ml solution Take 15 mLs by mouth 4 (four) times daily as needed for moderate pain. 120 mL 0  . levothyroxine (SYNTHROID) 150 MCG tablet Take 150 mcg by mouth every other day. Alternating with 175 mcg dose    . levothyroxine (SYNTHROID) 175 MCG tablet Take 175 mcg by mouth every other day. Alternating with 150 mcg dose    . lidocaine (XYLOCAINE) 2 % solution Patient: Mix 1part 2% viscous lidocaine, 1part H20. Swallow 57mL of  diluted mixture, 77min before meals and at bedtime, up to QID prn soreness 100 mL 5  . lidocaine-prilocaine (EMLA) cream Apply to affected area once 30 g 3  . LORazepam (ATIVAN) 0.5 MG tablet Take 1 tablet (0.5 mg total) by mouth every 6 (six) hours as needed (Nausea or vomiting). 30 tablet 0  . ondansetron (ZOFRAN) 8 MG tablet Take 1 tablet (8 mg total) by mouth 2 (two) times daily as needed. Start on the third day after chemotherapy. 30 tablet 1  . prochlorperazine (COMPAZINE) 10 MG tablet Take 1 tablet (10 mg total) by mouth every 6 (six) hours as needed (Nausea or vomiting). 30 tablet 1  . sodium fluoride (PREVIDENT 5000 PLUS) 1.1 % CREA dental cream Apply cream to tooth brush. Brush teeth for 2 minutes. Spit out excess. DO NOT rinse afterwards. Repeat nightly. (Patient taking differently: Place 1 application onto teeth 2 (two) times a day. ) 1 Tube prn   No current facility-administered medications for this visit.     PHYSICAL EXAMINATION: ECOG PERFORMANCE STATUS: 1 - Symptomatic but completely ambulatory  Today's Vitals   03/24/19 0948 03/24/19 0954  BP: 106/67   Pulse: 76   Resp: 18   Temp: 99.1 F (37.3 C)  TempSrc: Oral   SpO2: 99%   Weight: 150 lb 11.2 oz (68.4 kg)   Height: 5\' 2"  (1.575 m)   PainSc:  1    Body mass index is 27.56 kg/m.  Filed Weights   03/24/19 0948  Weight: 150 lb 11.2 oz (68.4 kg)    GENERAL: alert, no distress and comfortable SKIN: skin color, texture, turgor are normal, no rashes or significant lesions EYES: conjunctiva are pink and non-injected, sclera clear OROPHARYNX: mild mucositis, no oral candidiasis  NECK: supple, non-tender, tracheostomy site clean  LUNGS: clear to auscultation with normal breathing effort HEART: regular rate & rhythm and no murmurs and no lower extremity edema ABDOMEN: soft, non-tender, non-distended, normal bowel sounds Musculoskeletal: no cyanosis of digits and no clubbing  PSYCH: alert & oriented x 3, fluent  speech NEURO: no focal motor/sensory deficits  LABORATORY DATA:  I have reviewed the data as listed    Component Value Date/Time   NA 133 (L) 03/24/2019 0908   K 4.1 03/24/2019 0908   CL 98 03/24/2019 0908   CO2 25 03/24/2019 0908   GLUCOSE 83 03/24/2019 0908   BUN 16 03/24/2019 0908   CREATININE 0.76 03/24/2019 0908   CALCIUM 8.9 03/24/2019 0908   PROT 6.9 02/17/2019 1000   ALBUMIN 3.4 (L) 02/17/2019 1000   AST 17 02/17/2019 1000   ALT 25 02/17/2019 1000   ALKPHOS 93 02/17/2019 1000   BILITOT 0.5 02/17/2019 1000   GFRNONAA >60 03/24/2019 0908   GFRAA >60 03/24/2019 0908    No results found for: SPEP, UPEP  Lab Results  Component Value Date   WBC 3.0 (L) 03/24/2019   NEUTROABS 2.1 03/24/2019   HGB 10.7 (L) 03/24/2019   HCT 32.3 (L) 03/24/2019   MCV 82.6 03/24/2019   PLT 146 (L) 03/24/2019      Chemistry      Component Value Date/Time   NA 133 (L) 03/24/2019 0908   K 4.1 03/24/2019 0908   CL 98 03/24/2019 0908   CO2 25 03/24/2019 0908   BUN 16 03/24/2019 0908   CREATININE 0.76 03/24/2019 0908      Component Value Date/Time   CALCIUM 8.9 03/24/2019 0908   ALKPHOS 93 02/17/2019 1000   AST 17 02/17/2019 1000   ALT 25 02/17/2019 1000   BILITOT 0.5 02/17/2019 1000       RADIOGRAPHIC STUDIES: I have personally reviewed the radiological images as listed below and agreed with the findings in the report. Dg Carlena Hurl [facility] [employer] Speech Path  Result Date: 03/17/2019 Objective Swallowing Evaluation: Type of Study: MBS-Modified Barium Swallow Study  Patient Details Name: [name] [name] MRN: [id number] Date of Birth: Jun 23, 1951 Today's Date: 03/17/2019 Time: SLP Start Time (ACUTE ONLY): 1335 -SLP Stop Time (ACUTE ONLY): 1405 SLP Time Calculation (min) (ACUTE ONLY): 30 min Past Medical History: Past Medical History: Diagnosis Date . Cataract  . Laryngeal cancer ([place]) 11/2018 . Thyroid disease  Past Surgical History: Past Surgical History: Procedure Laterality Date .  CATARACT EXTRACTION, BILATERAL  left 05/20/16, right 06/03/16  Cataract Extraction w/ IOL Implant; Surgeon: Sudie Grumbling, MD; Location: OR [place]; Service: Ophthalmology  . CESAREAN SECTION   . ESOPHAGOGASTRODUODENOSCOPY (EGD) WITH PROPOFOL N/A 02/01/2019  Procedure: ESOPHAGOGASTRODUODENOSCOPY (EGD) WITH PROPOFOL;  Surgeon: Milus Banister, MD;  Location: [facility];  Service: Endoscopy;  Laterality: N/A; . EUS N/A 02/01/2019  Procedure: UPPER ENDOSCOPIC ULTRASOUND (EUS) RADIAL-Will need Linear and Forward Viewing EUS Scope;  Surgeon: Milus Banister, MD;  Location: [facility];  Service: Endoscopy;  Laterality: N/A; . FINE NEEDLE ASPIRATION N/A 02/01/2019  Procedure: FINE NEEDLE ASPIRATION (FNA) LINEAR;  Surgeon: Milus Banister, MD;  Location: [facility];  Service: Endoscopy;  Laterality: N/A; . IR GASTROSTOMY TUBE MOD SED  02/09/2019 . IR IMAGING GUIDED PORT INSERTION  02/02/2019 . KNEE CARTILAGE SURGERY Right 2019 . TRACHEOSTOMY  12/02/2018  [facility] HPI: Pt is a 68 yo female referred for MBS due to h/o supraglottic cancer - diagnosed March 2020 after pt found to have respiratory distress.  She required emergent trach - and CT imaging showed supraglottic laryngeal carcinoma extending across anterior commissure and invading the prelaryngeal fat, left aryepiglottic fold and left aspect of epiglottis with severe nararowing of laryngeal airway and level 2A bilateral subcentimeter cervical lymph nodes.  Pt required PEG tube at that time and did not tolerate a PMSV due to back pressure and increased dyspnea when valve placed in March 2020.  Currently undergoing chemoradiation - radiation 5 x's week and chemo on Thursdays.  Pt reports 2 more weeks of radiation.  She has been consuming watered down pureed foods and liquids including Boost, etc.  She denies pneumonias nor requiring heimlich manuever. MBS indicated.   Subjective: pt awake in flouro chair Assessment / Plan / Recommendation CHL IP CLINICAL  IMPRESSIONS 03/17/2019 Clinical Impression Patient presents with moderate pharyngeal dysphagia due to significant edema from XRT impacting pharyngeal motility.  Due tongue base retraction results in compromised epiglottic deflection thus resulting in epiglottis residuals of liquids more than solids (mixed with secretions) that spill into airway after swallow.  Pharyngeal swallow is stronger with solids - puree/moist cracker without significant oropharyngeal residuals.  Increased pharyngeal contraction with increased viscocity.  Pt did have trace silent aspiration of thin barium that mixed with secretions and became apparent over the study.  She does NOT sense minimal trace aspiration and did not aspirate when challenged with sequential boluses of thin.  Head turn left, slight chin down did not improve airway protection.  Super supraglottic swallow attempted *with and without PMSV in place but was difficult for patient to coordinate.  Patient occluded trach with her finger when supraglottic swallow trialed without PMSV.    Although patient silently aspirated, it was only trace and included secretions. (Pt tolerated use of PMSV during MBS for 2 swallows ONLY due to report of increased dyspnea.   Encouraged her to continue to practice this super supraglottic swallow if needed in the future.  Educated her to continue po diet as tolerated.  Reviewed aspiration precautions, importance of oral care reviewed as well as recommendation to use caution with intake if she develops fevers or is overtly coughing with intake.  Further recommend patient continue neck massage prior to conducting exercises to maximize swallow exercise effectiveness, etc.  Hopeful for some swallowing improvement several weeks after completion of radiation therapy with decreased edema.  Educated pt to importance of continuing po intake and her swallowing exercises to mitigate fibrosis and disuse muscle atrophy. Thanks for this consult.       SLP Visit  Diagnosis Dysphagia, pharyngeal phase (R13.13) Attention and concentration deficit following -- Frontal lobe and executive function deficit following -- Impact on safety and function Mild aspiration risk   No flowsheet data found.  No flowsheet data found. CHL IP DIET RECOMMENDATION 03/17/2019 SLP Diet Recommendations (No Data) Liquid Administration via Cup;Straw Medication Administration Via alternative means Compensations Slow rate;Small sips/bites;Other (Comment) Postural Changes Remain semi-upright after after feeds/meals (Comment);Seated upright at 90 degrees  No flowsheet data found.  No flowsheet data found.  No flowsheet data found.     CHL IP ORAL PHASE 03/17/2019 Oral Phase WFL Oral - Pudding Teaspoon -- Oral - Pudding Cup -- Oral - Honey Teaspoon -- Oral - Honey Cup -- Oral - Nectar Teaspoon -- Oral - Nectar Cup -- Oral - Nectar Straw WFL Oral - Thin Teaspoon -- Oral - Thin Cup WFL Oral - Thin Straw WFL Oral - Puree WFL Oral - Mech Soft WFL Oral - Regular -- Oral - Multi-Consistency -- Oral - Pill -- Oral Phase - Comment --  CHL IP PHARYNGEAL PHASE 03/17/2019 Pharyngeal Phase Impaired Pharyngeal- Pudding Teaspoon -- Pharyngeal -- Pharyngeal- Pudding Cup -- Pharyngeal -- Pharyngeal- Honey Teaspoon -- Pharyngeal -- Pharyngeal- Honey Cup -- Pharyngeal -- Pharyngeal- Nectar Teaspoon -- Pharyngeal -- Pharyngeal- Nectar Cup -- Pharyngeal -- Pharyngeal- Nectar Straw WFL;Reduced airway/laryngeal closure Pharyngeal -- Pharyngeal- Thin Teaspoon -- Pharyngeal -- Pharyngeal- Thin Cup Reduced epiglottic inversion;Reduced tongue base retraction;Penetration/Aspiration during swallow;Penetration/Apiration after swallow;Trace aspiration Pharyngeal Material enters airway, passes BELOW cords without attempt by patient to eject out (silent aspiration) Pharyngeal- Thin Straw Reduced epiglottic inversion;Penetration/Aspiration during swallow;Penetration/Apiration after swallow;Trace aspiration Pharyngeal Material enters airway,  passes BELOW cords without attempt by patient to eject out (silent aspiration) Pharyngeal- Puree WFL Pharyngeal -- Pharyngeal- Mechanical Soft WFL Pharyngeal -- Pharyngeal- Regular -- Pharyngeal -- Pharyngeal- Multi-consistency -- Pharyngeal -- Pharyngeal- Pill -- Pharyngeal -- Pharyngeal Comment secretions retained mix with barium and are aspirated with trace amount of barium, chin tuck *minimal chin lowering* did not improve airway protection and dumped pyriform sinus residuals into open airway post-swallow, edematous tissue decreased pharyngeal motility, pharyngeal tongue base retraction impaired thus resulting in decreased epiglottic deflection resulting in mild resdiuals that spill into open airway after swallow, super supraglottic swallow performed with and without PMSV was difficult for pt to coordinate correctly and thus did not improve swallow safety- advised she continue to trials this strategy to use if helpful in the future, swallow  CHL IP CERVICAL ESOPHAGEAL PHASE 03/17/2019 Cervical Esophageal Phase Impaired Pudding Teaspoon -- Pudding Cup -- Honey Teaspoon -- Honey Cup -- Nectar Teaspoon -- Nectar Cup -- Nectar Straw -- Thin Teaspoon -- Thin Cup -- Thin Straw -- Puree -- Mechanical Soft -- Regular -- Multi-consistency -- Pill -- Cervical Esophageal Comment Minimal amount of distal pharyngeal residuals, did not perform esophageal sweep Macario Golds 03/17/2019, 4:41 PM  Luanna Salk, MS [facility] SLP Acute Rehab Services Pager [phone] Office [phone]     CLINICAL DATA:  68 year old female undergoing treatment for supraglottic laryngeal cancer including radiation. Tracheostomy. Dysphagia. Cough. EXAM: MODIFIED BARIUM SWALLOW TECHNIQUE: Different consistencies of barium were administered orally to the patient by the Speech Pathologist. Imaging of the pharynx was performed in the lateral projection. The radiologist was present in the fluoroscopy room for this study, providing personal  supervision. FLUOROSCOPY TIME:  Fluoroscopy Time:  2 minutes 6 seconds Radiation Exposure Index (if provided by the fluoroscopic device): 7.2 mGy Number of Acquired Spot Images: 0 COMPARISON:  Neck CT 12/02/2018. FINDINGS: Tracheostomy in place, and evidence of abundant hypopharynx and larynx edema. Flash penetration with trace aspiration, which slowly became apparent over the course of the study due to faint contrast staining of the ventral trachea, occurred with thin liquids. Only mild retention noted otherwise. IMPRESSION: Trace aspiration which became slowly apparent over the course of the study. Please refer to the Speech Pathologists report for complete details and recommendations. Electronically Signed   By: Lemmie Evens  [place] Crane M.D.   On: 03/17/2019 14:21

## 2019-03-23 ENCOUNTER — Other Ambulatory Visit: Payer: Self-pay | Admitting: Lab

## 2019-03-23 ENCOUNTER — Ambulatory Visit
Admission: RE | Admit: 2019-03-23 | Discharge: 2019-03-23 | Disposition: A | Discharge status: Home / Self Care | Payer: Medicare HMO | Source: Ambulatory Visit | Attending: Radiation Oncology | Admitting: Radiation Oncology

## 2019-03-23 ENCOUNTER — Other Ambulatory Visit: Payer: Self-pay

## 2019-03-23 DIAGNOSIS — Z930 Tracheostomy status: Secondary | ICD-10-CM | POA: Diagnosis not present

## 2019-03-23 DIAGNOSIS — Z87891 Personal history of nicotine dependence: Secondary | ICD-10-CM | POA: Diagnosis not present

## 2019-03-23 DIAGNOSIS — Z510 Encounter for antineoplastic radiation therapy: Secondary | ICD-10-CM | POA: Diagnosis not present

## 2019-03-23 DIAGNOSIS — C321 Malignant neoplasm of supraglottis: Secondary | ICD-10-CM | POA: Diagnosis not present

## 2019-03-23 DIAGNOSIS — Z431 Encounter for attention to gastrostomy: Secondary | ICD-10-CM | POA: Diagnosis not present

## 2019-03-24 ENCOUNTER — Encounter: Payer: Self-pay | Admitting: Hematology

## 2019-03-24 ENCOUNTER — Other Ambulatory Visit: Payer: Self-pay | Admitting: *Deleted

## 2019-03-24 ENCOUNTER — Other Ambulatory Visit: Payer: Self-pay | Admitting: Nutrition

## 2019-03-24 ENCOUNTER — Inpatient Hospital Stay: Payer: Medicare HMO

## 2019-03-24 ENCOUNTER — Other Ambulatory Visit: Payer: Self-pay

## 2019-03-24 ENCOUNTER — Inpatient Hospital Stay: Payer: Medicare HMO | Attending: Hematology | Admitting: Hematology

## 2019-03-24 ENCOUNTER — Telehealth: Payer: Self-pay | Admitting: Nutrition

## 2019-03-24 ENCOUNTER — Ambulatory Visit
Admission: RE | Admit: 2019-03-24 | Discharge: 2019-03-24 | Disposition: A | Discharge status: Home / Self Care | Payer: Medicare HMO | Source: Ambulatory Visit | Attending: Radiation Oncology | Admitting: Radiation Oncology

## 2019-03-24 VITALS — BP 106/67 | HR 76 | Temp 99.1°F | Resp 18 | Ht 62.0 in | Wt 150.7 lb

## 2019-03-24 DIAGNOSIS — D701 Agranulocytosis secondary to cancer chemotherapy: Secondary | ICD-10-CM | POA: Insufficient documentation

## 2019-03-24 DIAGNOSIS — D6959 Other secondary thrombocytopenia: Secondary | ICD-10-CM | POA: Diagnosis not present

## 2019-03-24 DIAGNOSIS — M542 Cervicalgia: Secondary | ICD-10-CM | POA: Diagnosis not present

## 2019-03-24 DIAGNOSIS — E46 Unspecified protein-calorie malnutrition: Secondary | ICD-10-CM | POA: Insufficient documentation

## 2019-03-24 DIAGNOSIS — I959 Hypotension, unspecified: Secondary | ICD-10-CM | POA: Diagnosis not present

## 2019-03-24 DIAGNOSIS — Z5111 Encounter for antineoplastic chemotherapy: Secondary | ICD-10-CM | POA: Insufficient documentation

## 2019-03-24 DIAGNOSIS — K1231 Oral mucositis (ulcerative) due to antineoplastic therapy: Secondary | ICD-10-CM | POA: Diagnosis not present

## 2019-03-24 DIAGNOSIS — C321 Malignant neoplasm of supraglottis: Secondary | ICD-10-CM | POA: Diagnosis not present

## 2019-03-24 DIAGNOSIS — Z79890 Hormone replacement therapy (postmenopausal): Secondary | ICD-10-CM | POA: Insufficient documentation

## 2019-03-24 DIAGNOSIS — K1230 Oral mucositis (ulcerative), unspecified: Secondary | ICD-10-CM | POA: Insufficient documentation

## 2019-03-24 DIAGNOSIS — T451X5A Adverse effect of antineoplastic and immunosuppressive drugs, initial encounter: Secondary | ICD-10-CM | POA: Diagnosis not present

## 2019-03-24 DIAGNOSIS — I700 Atherosclerosis of aorta: Secondary | ICD-10-CM | POA: Diagnosis not present

## 2019-03-24 DIAGNOSIS — E871 Hypo-osmolality and hyponatremia: Secondary | ICD-10-CM | POA: Diagnosis not present

## 2019-03-24 DIAGNOSIS — D6481 Anemia due to antineoplastic chemotherapy: Secondary | ICD-10-CM | POA: Diagnosis not present

## 2019-03-24 DIAGNOSIS — Z95828 Presence of other vascular implants and grafts: Secondary | ICD-10-CM

## 2019-03-24 DIAGNOSIS — Z79899 Other long term (current) drug therapy: Secondary | ICD-10-CM | POA: Insufficient documentation

## 2019-03-24 DIAGNOSIS — R070 Pain in throat: Secondary | ICD-10-CM | POA: Insufficient documentation

## 2019-03-24 DIAGNOSIS — E039 Hypothyroidism, unspecified: Secondary | ICD-10-CM | POA: Diagnosis not present

## 2019-03-24 DIAGNOSIS — Z510 Encounter for antineoplastic radiation therapy: Secondary | ICD-10-CM | POA: Insufficient documentation

## 2019-03-24 LAB — CBC WITH DIFFERENTIAL (CANCER CENTER ONLY)
Abs Immature Granulocytes: 0.01 10*3/uL (ref 0.00–0.07)
Basophils Absolute: 0 10*3/uL (ref 0.0–0.1)
Basophils Relative: 1 %
Eosinophils Absolute: 0 10*3/uL (ref 0.0–0.5)
Eosinophils Relative: 1 %
HCT: 32.3 % — ABNORMAL LOW (ref 36.0–46.0)
Hemoglobin: 10.7 g/dL — ABNORMAL LOW (ref 12.0–15.0)
Immature Granulocytes: 0 %
Lymphocytes Relative: 15 %
Lymphs Abs: 0.5 10*3/uL — ABNORMAL LOW (ref 0.7–4.0)
MCH: 27.4 pg (ref 26.0–34.0)
MCHC: 33.1 g/dL (ref 30.0–36.0)
MCV: 82.6 fL (ref 80.0–100.0)
Monocytes Absolute: 0.3 10*3/uL (ref 0.1–1.0)
Monocytes Relative: 11 %
Neutro Abs: 2.1 10*3/uL (ref 1.7–7.7)
Neutrophils Relative %: 72 %
Platelet Count: 146 10*3/uL — ABNORMAL LOW (ref 150–400)
RBC: 3.91 MIL/uL (ref 3.87–5.11)
RDW: 17 % — ABNORMAL HIGH (ref 11.5–15.5)
WBC Count: 3 10*3/uL — ABNORMAL LOW (ref 4.0–10.5)
nRBC: 0 % (ref 0.0–0.2)

## 2019-03-24 LAB — BASIC METABOLIC PANEL - CANCER CENTER ONLY
Anion gap: 10 (ref 5–15)
BUN: 16 mg/dL (ref 8–23)
CO2: 25 mmol/L (ref 22–32)
Calcium: 8.9 mg/dL (ref 8.9–10.3)
Chloride: 98 mmol/L (ref 98–111)
Creatinine: 0.76 mg/dL (ref 0.44–1.00)
GFR, Est AFR Am: >60 mL/min (ref >60)
GFR, Estimated: >60 mL/min (ref >60)
Glucose, Bld: 83 mg/dL (ref 70–99)
Potassium: 4.1 mmol/L (ref 3.5–5.1)
Sodium: 133 mmol/L — ABNORMAL LOW (ref 135–145)

## 2019-03-24 LAB — MAGNESIUM: Magnesium: 2.2 mg/dL (ref 1.7–2.4)

## 2019-03-24 MED ORDER — HEPARIN SOD (PORK) LOCK FLUSH 100 UNIT/ML IV SOLN
500.0000 [IU] | Freq: Once | INTRAVENOUS | Status: AC | PRN
  Administered 2019-03-24: 10:00:00 500 [IU]
  Filled 2019-03-24: qty 5

## 2019-03-24 MED ORDER — SODIUM CHLORIDE 0.9% FLUSH
10.0000 mL | INTRAVENOUS | Status: DC | PRN
  Administered 2019-03-24: 10 mL
  Filled 2019-03-24: qty 10

## 2019-03-24 MED ORDER — KATE FARMS PEPTIDE 1.5 PO LIQD: 4.0000 | Freq: Every day | ORAL | 6 refills | Status: DC

## 2019-03-24 NOTE — Telephone Encounter (Signed)
Left message for patient to verify telephone visit for pre reg °

## 2019-03-24 NOTE — Progress Notes (Signed)
Patient receives chemotherapy and radiation for Larynx cancer. Patient has PEG but has been unable to tolerate Osmolite 1.5.  She describes bloating and diarrhea. Patient has had continued weight loss with last weight documented as 150.7 pounds July 1 decreased from 161.9 pounds on May 20.  This is a 7% weight loss over 6 weeks. Patient tried [place] with good success and reports no bloating or diarrhea. Patient requires [employer]/TF to meet greater than 90% estimated nutrition needs/DRIs.  4 bottles of [employer] [place] provides 2000 kcal, 96 grams protein.  Nutrition diagnosis: Unintentional weight loss continues.  Estimated nutrition needs: 2000-2200 cal, 100-115 g protein, 2.0 L fluid.  Intervention: Educated patient to increase [employer] peptide to 1 bottle 4 times daily via G-tube with 60 mL free water before and after each feeding. She will continue water by mouth as tolerated. She is permitted pured foods in small amounts. Patient educated and orders written.  Monitoring, Evaluation, Goals: Patient will tolerate TF to meet greater than estimated nutrition needs.  Next Visit: Thursday, July 2.

## 2019-03-25 ENCOUNTER — Other Ambulatory Visit: Payer: Self-pay

## 2019-03-25 ENCOUNTER — Telehealth: Payer: Self-pay | Admitting: Hematology

## 2019-03-25 ENCOUNTER — Ambulatory Visit
Admission: RE | Admit: 2019-03-25 | Discharge: 2019-03-25 | Disposition: A | Discharge status: Home / Self Care | Payer: Medicare HMO | Source: Ambulatory Visit | Attending: Radiation Oncology | Admitting: Radiation Oncology

## 2019-03-25 ENCOUNTER — Inpatient Hospital Stay: Payer: Medicare HMO

## 2019-03-25 ENCOUNTER — Inpatient Hospital Stay: Payer: Medicare HMO | Admitting: Nutrition

## 2019-03-25 VITALS — BP 114/72 | HR 78 | Temp 97.7°F | Resp 20

## 2019-03-25 DIAGNOSIS — D701 Agranulocytosis secondary to cancer chemotherapy: Secondary | ICD-10-CM | POA: Diagnosis not present

## 2019-03-25 DIAGNOSIS — M542 Cervicalgia: Secondary | ICD-10-CM | POA: Diagnosis not present

## 2019-03-25 DIAGNOSIS — E039 Hypothyroidism, unspecified: Secondary | ICD-10-CM | POA: Diagnosis not present

## 2019-03-25 DIAGNOSIS — D6959 Other secondary thrombocytopenia: Secondary | ICD-10-CM | POA: Diagnosis not present

## 2019-03-25 DIAGNOSIS — T451X5A Adverse effect of antineoplastic and immunosuppressive drugs, initial encounter: Secondary | ICD-10-CM | POA: Diagnosis not present

## 2019-03-25 DIAGNOSIS — C321 Malignant neoplasm of supraglottis: Secondary | ICD-10-CM | POA: Diagnosis not present

## 2019-03-25 DIAGNOSIS — Z510 Encounter for antineoplastic radiation therapy: Secondary | ICD-10-CM | POA: Diagnosis not present

## 2019-03-25 DIAGNOSIS — K1231 Oral mucositis (ulcerative) due to antineoplastic therapy: Secondary | ICD-10-CM | POA: Diagnosis not present

## 2019-03-25 DIAGNOSIS — D6481 Anemia due to antineoplastic chemotherapy: Secondary | ICD-10-CM | POA: Diagnosis not present

## 2019-03-25 DIAGNOSIS — I959 Hypotension, unspecified: Secondary | ICD-10-CM | POA: Diagnosis not present

## 2019-03-25 DIAGNOSIS — Z5111 Encounter for antineoplastic chemotherapy: Secondary | ICD-10-CM | POA: Diagnosis not present

## 2019-03-25 MED ORDER — SODIUM CHLORIDE 0.9 % IV SOLN
Freq: Once | INTRAVENOUS | Status: AC
  Administered 2019-03-25: 10:00:00 via INTRAVENOUS
  Filled 2019-03-25: qty 5

## 2019-03-25 MED ORDER — PALONOSETRON HCL INJECTION 0.25 MG/5ML
0.2500 mg | Freq: Once | INTRAVENOUS | Status: AC
  Administered 2019-03-25: 0.25 mg via INTRAVENOUS

## 2019-03-25 MED ORDER — HEPARIN SOD (PORK) LOCK FLUSH 100 UNIT/ML IV SOLN
500.0000 [IU] | Freq: Once | INTRAVENOUS | Status: AC | PRN
  Administered 2019-03-25: 15:00:00 500 [IU]
  Filled 2019-03-25: qty 5

## 2019-03-25 MED ORDER — SODIUM CHLORIDE 0.9% FLUSH
10.0000 mL | INTRAVENOUS | Status: DC | PRN
  Administered 2019-03-25: 15:00:00 10 mL
  Filled 2019-03-25: qty 10

## 2019-03-25 MED ORDER — POTASSIUM CHLORIDE 2 MEQ/ML IV SOLN
Freq: Once | INTRAVENOUS | Status: AC
  Administered 2019-03-25: 08:00:00 via INTRAVENOUS
  Filled 2019-03-25: qty 10

## 2019-03-25 MED ORDER — PALONOSETRON HCL INJECTION 0.25 MG/5ML
INTRAVENOUS | Status: AC
  Filled 2019-03-25: qty 5

## 2019-03-25 MED ORDER — SODIUM CHLORIDE 0.9 % IV SOLN
Freq: Once | INTRAVENOUS | Status: AC
  Administered 2019-03-25: 08:00:00 via INTRAVENOUS
  Filled 2019-03-25: qty 250

## 2019-03-25 MED ORDER — SODIUM CHLORIDE 0.9 % IV SOLN
40.0000 mg/m2 | Freq: Once | INTRAVENOUS | Status: AC
  Administered 2019-03-25: 12:00:00 72 mg via INTRAVENOUS
  Filled 2019-03-25: qty 72

## 2019-03-25 NOTE — Patient Instructions (Signed)
Cancer Center Discharge Instructions for Patients Receiving Chemotherapy  Today you received the following chemotherapy agents Cisplatin  To help prevent nausea and vomiting after your treatment, we encourage you to take your nausea medication as directed  If you develop nausea and vomiting that is not controlled by your nausea medication, call the clinic.   BELOW ARE SYMPTOMS THAT SHOULD BE REPORTED IMMEDIATELY:  *FEVER GREATER THAN 100.5 F  *CHILLS WITH OR WITHOUT FEVER  NAUSEA AND VOMITING THAT IS NOT CONTROLLED WITH YOUR NAUSEA MEDICATION  *UNUSUAL SHORTNESS OF BREATH  *UNUSUAL BRUISING OR BLEEDING  TENDERNESS IN MOUTH AND THROAT WITH OR WITHOUT PRESENCE OF ULCERS  *URINARY PROBLEMS  *BOWEL PROBLEMS  UNUSUAL RASH Items with * indicate a potential emergency and should be followed up as soon as possible.  Feel free to call the clinic should you have any questions or concerns. The clinic phone number is [phone].  Please show the CHEMO ALERT CARD at check-in to the Emergency Department and triage nurse.   

## 2019-03-25 NOTE — Telephone Encounter (Signed)
Per los, no change in appts

## 2019-03-29 ENCOUNTER — Other Ambulatory Visit: Payer: Self-pay

## 2019-03-29 ENCOUNTER — Ambulatory Visit
Admission: RE | Admit: 2019-03-29 | Discharge: 2019-03-29 | Disposition: A | Discharge status: Home / Self Care | Payer: Medicare HMO | Source: Ambulatory Visit | Attending: Radiation Oncology | Admitting: Radiation Oncology

## 2019-03-29 DIAGNOSIS — Z510 Encounter for antineoplastic radiation therapy: Secondary | ICD-10-CM | POA: Diagnosis not present

## 2019-03-29 DIAGNOSIS — C321 Malignant neoplasm of supraglottis: Secondary | ICD-10-CM | POA: Diagnosis not present

## 2019-03-30 ENCOUNTER — Other Ambulatory Visit: Payer: Self-pay | Admitting: Lab

## 2019-03-30 ENCOUNTER — Ambulatory Visit
Admission: RE | Admit: 2019-03-30 | Discharge: 2019-03-30 | Disposition: A | Discharge status: Home / Self Care | Payer: Medicare HMO | Source: Ambulatory Visit | Attending: Radiation Oncology | Admitting: Radiation Oncology

## 2019-03-30 ENCOUNTER — Other Ambulatory Visit: Payer: Self-pay

## 2019-03-30 DIAGNOSIS — Z510 Encounter for antineoplastic radiation therapy: Secondary | ICD-10-CM | POA: Diagnosis not present

## 2019-03-30 DIAGNOSIS — C321 Malignant neoplasm of supraglottis: Secondary | ICD-10-CM | POA: Diagnosis not present

## 2019-03-30 NOTE — Progress Notes (Signed)
[place] OFFICE PROGRESS NOTE  Patient Care Team: Philmore Pali, NP as PCP - General (Nurse Practitioner) Eppie Gibson, MD as Attending Physician (Radiation Oncology) Tish Men, MD as Consulting Physician (Hematology) Leota Sauers, RN as Oncology Nurse Navigator Karie Mainland, RD as Dietitian (Nutrition) Sharen Counter, CCC-SLP as Speech Language Pathologist (Speech Pathology)  HEME/ONC OVERVIEW: 1.  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 -11/2018:   3.1 x 2.8cm left supraglottic laryngeal mass extending across the anterior commissure and invading the prelaryngeal fat and suspicious <1cm bilateral Level II cervical LN's on CT  Transferred to [facility] for airway compromise and taken to OR for tracheostomy; multiple bx'es non-diagnostic -12/2018: repeat DL with bx of the supraglottic mass showed well-differentiated invasive squamous cell carcinoma  -01/2019: PET showed locally advanced disease, including bilateral cervical LN involvement; mildly FDG-avid mediastinal LN's, bx negative for malignancy -Mid-01/2019 - present: definitive chemoradiation with weekly cisplatin   2. 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 -S/p 6 doses of weekly cisplatin concurrent with RT in the definitive setting -Labs notable for progressive leukopenia and borderline neutropenia -As this is the patient's last dose of chemotherapy, we will proceed with Cycle 7 (and the last cycle) of treatment -PRN anti-emetics: Zofran, Compazine, Ativan and dexamethasone   Chemotherapy-associated anemia -Secondary to chemotherapy -Hgb 10.4, stable  -Patient denies any symptom of bleeding -We will monitor for now; no indication for dose  adjustment  Chemotherapy-associated leukopenia -Secondary to chemotherapy -WBC 1.9k with ANC 1300, slightly lower than last week -Patient denies any symptoms of infection -As this is the last dose of the patient's chemotherapy, we will proceed with treatment as scheduled -I counseled the patient on any symptoms of infection, including T > 100.4, for which she should contact the clinic ASAP   Chemotherapy-associated thrombocytopenia -Secondary to chemotherapy -Plts 90k, lower than last week -Patient denies any symptoms of bleeding or excess bruising, such as epistaxis, hematochezia, melena, or hematuria -We will monitor for now  Hyponatremia  -Na 132, stable; patient is asymptomatic -She has had occasional mild hyponatremia -We will monitor it for now  Chemotherapy-associated mucositis -Secondary to chemotherapy -Current regimen: IR liquid morphine 10mg /0.17mL q4hrs PRN, viscous lidocaine swish and swallow PRN and salt/soda rinses  -Pain relatively well controlled -Continue the regimen above  Protein malnutrition -Secondary to chemoradiation -Weight down by ~1 lbs since the last visit  -Currently taking 3 cans of [employer] and 8 bottles of 8oz water per day via PEG as well as 2 bottles of Boosts -Given the modest weight loss, I recommended increasing [employer] to 4 cans per day if she can tolerate it   No orders of the defined types were placed in this encounter.   All questions were answered. The patient knows to call the clinic with any problems, questions or concerns. No barriers to learning was detected.  Return in 1 week for labs, port flush and clinic appt.  Tish Men, MD 03/31/2019 11:23 AM  CHIEF COMPLAINT: "My mouth is sore"  INTERVAL HISTORY: Mr. Pettitt returns to clinic for follow-up of squamous cell carcinoma of the supraglottic larynx on definitive chemoradiation with weekly cisplatin.  Patient reports that over the past several days, she has had  increasing  mucositis pain, for which she has been doing regular lidocaine swish and swallow as well as IR liquid morphine, which she takes approximately once a day, with adequate pain relief.  She is currently taking 2 boosts and some vanilla pudding by mouth as well as 3 bottles of [employer] tube feeding via feeding tube.  She drinks a total of approximately 8 bottles of 8 ounce water per day (by mouth and via feeding tube).  She denies any other complaint today.  SUMMARY OF ONCOLOGIC HISTORY: Oncology History  Malignant neoplasm of supraglottis ([place])  12/02/2018 Imaging   CT neck: IMPRESSION: 1. Supraglottic laryngeal carcinoma extending across the anterior commissure and invading the prelaryngeal fat, left aryepiglottic fold and left aspect of the epiglottis. 2. Severe narrowing of the laryngeal airway. 3. Bilateral subcentimeter level 2A and 2A cervical lymph nodes.  Critical Value/emergent results were called by telephone at the time of interpretation on 12/02/2018 at 5:34 pm to Dr. Collier Salina, who verbally acknowledged these results.   12/02/2018 Imaging   CT  Chest:  IMPRESSION: No evidence of metastatic disease in the chest.   12/03/2018 Pathology Results   (Care Everywhere; [facility]) A: Larynx, supraglottis, biopsy - Atypical squamous proliferation with at least low grade dysplasia and abundant keratinization - No definite high grade dysplasia or invasive carcinoma represented (see comment)   12/18/2018 Pathology Results   (Care Everywhere; [facility]) A:  Neck, left, fine needle aspiration - Rare single atypical cells, no cells diagnostic of malignancy - Lymph node material present  B:  Neck, right, fine needle aspiration - Non-diagnostic specimen - No lymph node material identified - Mixed inflammation and blood   12/31/2018 Pathology Results   (Care Everywhere; [facility])  A: Larynx, left supraglottic mass, biopsy - Atypical squamous proliferation with abundant  keratinization, suspicious for well-differentiated invasive squamous cell carcinoma  B: Larynx, left supraglottic mass, biopsy - Well-differentiated invasive squamous cell carcinoma with abundant keratinization (see comment)   01/18/2019 Initial Diagnosis   Laryngeal cancer ([place])   01/26/2019 Imaging   PET: IMPRESSION: 1. Locally advanced hypermetabolic laryngeal neoplasm centered in the left supraglottic space with bilateral glottic and epiglottic involvement and subglottic extension on the left. 2. Hypermetabolic bilateral level 3 neck nodal metastases. 3. Hypermetabolic high mediastinal nodal metastases between the trachea and upper thoracic esophagus. 4. Otherwise no distant hypermetabolic metastatic disease. 5. Nodular 7 mm focus at the left lung base, below PET resolution, recommend attention on follow-up chest CT in 3 months. 6. Chronic findings include: Aortic Atherosclerosis (ICD10-I70.0). Cholelithiasis.   02/08/2019 Cancer Staging   Staging form: Larynx - Supraglottis, AJCC 8th Edition - Clinical stage from 02/08/2019: Stage IVA (cT3, cN2c, cM0) - Signed by Eppie Gibson, MD on 02/08/2019   02/11/2019 -  Chemotherapy   The patient had palonosetron (ALOXI) injection 0.25 mg, 0.25 mg, Intravenous,  Once, 6 of 7 cycles Administration: 0.25 mg (02/11/2019), 0.25 mg (02/18/2019), 0.25 mg (03/04/2019), 0.25 mg (03/11/2019), 0.25 mg (03/18/2019), 0.25 mg (03/25/2019) CISplatin (PLATINOL) 72 mg in sodium chloride 0.9 % 250 mL chemo infusion, 40 mg/m2 = 72 mg, Intravenous,  Once, 6 of 7 cycles Administration: 72 mg (02/11/2019), 72 mg (02/18/2019), 72 mg (03/04/2019), 72 mg (03/11/2019), 72 mg (03/18/2019), 72 mg (03/25/2019) fosaprepitant (EMEND) 150 mg, dexamethasone (DECADRON) 12 mg in sodium chloride 0.9 % 145 mL IVPB, , Intravenous,  Once, 6 of 7 cycles Administration:  (02/11/2019),  (02/18/2019),  (03/04/2019),  (03/11/2019),  (03/18/2019),  (03/25/2019)  for chemotherapy  treatment.      REVIEW OF  SYSTEMS:   Constitutional: ( - ) fevers, ( - )  chills , ( - ) night sweats Eyes: ( - ) blurriness of vision, ( - ) double vision, ( - ) watery eyes Ears, nose, mouth, throat, and face: ( + ) mucositis, ( - ) sore throat Respiratory: ( - ) cough, ( - ) dyspnea, ( - ) wheezes Cardiovascular: ( - ) palpitation, ( - ) chest discomfort, ( - ) lower extremity swelling Gastrointestinal:  ( - ) nausea, ( - ) heartburn, ( - ) change in bowel habits Skin: ( - ) abnormal skin rashes Lymphatics: ( - ) new lymphadenopathy, ( - ) easy bruising Neurological: ( - ) numbness, ( - ) tingling, ( - ) new weaknesses Behavioral/Psych: ( - ) mood change, ( - ) new changes  All other systems were reviewed with the patient and are negative.  I have reviewed the past medical history, past surgical history, social history and family history with the patient and they are unchanged from previous note.  ALLERGIES:  has No Known Allergies.  MEDICATIONS:  Current Outpatient Medications  Medication Sig Dispense Refill  . chlorhexidine (PERIDEX) 0.12 % solution Rinse with 15 mls twice daily for 30 seconds. Use after breakfast and at bedtime. Spit out excess. Do not swallow. (Patient taking differently: Use as directed 15 mLs in the mouth or throat 2 (two) times daily. Rinse with 15 mls twice daily for 30 seconds. Use after breakfast and at bedtime. Spit out excess. Do not swallow.) 480 mL prn  . dexamethasone (DECADRON) 4 MG tablet Take 2 tablets by mouth once a day on the day after chemotherapy and then take 2 tablets two times a day for 2 days. Take with food. 30 tablet 1  . docusate sodium (COLACE) 100 MG capsule Take 100 mg by mouth 2 (two) times daily.    Marland Kitchen HYDROcodone-acetaminophen (HYCET) 7.5-325 mg/15 ml solution Take 15 mLs by mouth 4 (four) times daily as needed for moderate pain. 120 mL 0  . levothyroxine (SYNTHROID) 150 MCG tablet Take 150 mcg by mouth every other day. Alternating with 175 mcg dose    .  levothyroxine (SYNTHROID) 175 MCG tablet Take 175 mcg by mouth every other day. Alternating with 150 mcg dose    . lidocaine (XYLOCAINE) 2 % solution Patient: Mix 1part 2% viscous lidocaine, 1part H20. Swallow 22mL of diluted mixture, 60min before meals and at bedtime, up to QID prn soreness 100 mL 5  . lidocaine-prilocaine (EMLA) cream Apply to affected area once 30 g 3  . LORazepam (ATIVAN) 0.5 MG tablet Take 1 tablet (0.5 mg total) by mouth every 6 (six) hours as needed (Nausea or vomiting). 30 tablet 0  . Nutritional Supplements (KATE FARMS PEPTIDE 1.5) LIQD Take 4 Bottles by mouth daily. 325 mL 6  . ondansetron (ZOFRAN) 8 MG tablet Take 1 tablet (8 mg total) by mouth 2 (two) times daily as needed. Start on the third day after chemotherapy. 30 tablet 1  . prochlorperazine (COMPAZINE) 10 MG tablet Take 1 tablet (10 mg total) by mouth every 6 (six) hours as needed (Nausea or vomiting). 30 tablet 1  . sodium fluoride (PREVIDENT 5000 PLUS) 1.1 % CREA dental cream Apply cream to tooth brush. Brush teeth for 2 minutes. Spit out excess. DO NOT rinse afterwards. Repeat nightly. (Patient taking differently: Place 1 application onto teeth 2 (two) times a day. ) 1 Tube prn  No current facility-administered medications for this visit.     PHYSICAL EXAMINATION: ECOG PERFORMANCE STATUS: 1 - Symptomatic but completely ambulatory  Today's Vitals   03/31/19 1040  BP: (!) 91/59  Pulse: 76  Resp: 18  Temp: 98.9 F (37.2 C)  TempSrc: Temporal  SpO2: 98%  Height: 5\' 2"  (1.575 m)   Body mass index is 27.56 kg/m.  There were no vitals filed for this visit.  GENERAL: alert, no distress and comfortable SKIN: skin color, texture, turgor are normal, no rashes or significant lesions EYES: conjunctiva are pink and non-injected, sclera clear OROPHARYNX: Grade 2 mucositis, no definite oral candidiasis  NECK: supple, non-tender, tracheostomy in place  LYMPH:  shotty bilateral cervical LN's palpated  (<1cm) LUNGS: clear to auscultation with normal breathing effort HEART: regular rate & rhythm and no murmurs and no lower extremity edema ABDOMEN: soft, non-tender, non-distended, normal bowel sounds Musculoskeletal: no cyanosis of digits and no clubbing  PSYCH: alert & oriented x 3, fluent speech NEURO: no focal motor/sensory deficits  LABORATORY DATA:  I have reviewed the data as listed    Component Value Date/Time   NA 132 (L) 03/31/2019 1030   K 4.0 03/31/2019 1030   CL 98 03/31/2019 1030   CO2 25 03/31/2019 1030   GLUCOSE 96 03/31/2019 1030   BUN 19 03/31/2019 1030   CREATININE 0.77 03/31/2019 1030   CALCIUM 8.8 (L) 03/31/2019 1030   PROT 6.9 02/17/2019 1000   ALBUMIN 3.4 (L) 02/17/2019 1000   AST 17 02/17/2019 1000   ALT 25 02/17/2019 1000   ALKPHOS 93 02/17/2019 1000   BILITOT 0.5 02/17/2019 1000   GFRNONAA >60 03/31/2019 1030   GFRAA >60 03/31/2019 1030    No results found for: SPEP, UPEP  Lab Results  Component Value Date   WBC 1.9 (L) 03/31/2019   NEUTROABS 1.3 (L) 03/31/2019   HGB 10.4 (L) 03/31/2019   HCT 31.0 (L) 03/31/2019   MCV 83.8 03/31/2019   PLT 90 (L) 03/31/2019      Chemistry      Component Value Date/Time   NA 132 (L) 03/31/2019 1030   K 4.0 03/31/2019 1030   CL 98 03/31/2019 1030   CO2 25 03/31/2019 1030   BUN 19 03/31/2019 1030   CREATININE 0.77 03/31/2019 1030      Component Value Date/Time   CALCIUM 8.8 (L) 03/31/2019 1030   ALKPHOS 93 02/17/2019 1000   AST 17 02/17/2019 1000   ALT 25 02/17/2019 1000   BILITOT 0.5 02/17/2019 1000       RADIOGRAPHIC STUDIES: I have personally reviewed the radiological images as listed below and agreed with the findings in the report. Dg Carlena Hurl [facility] [employer] Speech Path  Result Date: 03/17/2019 Objective Swallowing Evaluation: Type of Study: MBS-Modified Barium Swallow Study  Patient Details Name: ANISE HARBIN MRN: [id number] Date of Birth: [birth year] Today's Date: 03/17/2019 Time: SLP Start  Time (ACUTE ONLY): 1335 -SLP Stop Time (ACUTE ONLY): 1405 SLP Time Calculation (min) (ACUTE ONLY): 30 min Past Medical History: Past Medical History: Diagnosis Date . Cataract  . Laryngeal cancer ([place]) 11/2018 . Thyroid disease  Past Surgical History: Past Surgical History: Procedure Laterality Date . CATARACT EXTRACTION, BILATERAL  left 05/20/16, right 06/03/16  Cataract Extraction w/ IOL Implant; Surgeon: Sudie Grumbling, MD; Location: OR [facility]; Service: Ophthalmology  . CESAREAN SECTION   . ESOPHAGOGASTRODUODENOSCOPY (EGD) WITH PROPOFOL N/A 02/01/2019  Procedure: ESOPHAGOGASTRODUODENOSCOPY (EGD) WITH PROPOFOL;  Surgeon: Milus Banister, MD;  Location: [facility];  Service: Endoscopy;  Laterality: N/A; . EUS N/A 02/01/2019  Procedure: UPPER ENDOSCOPIC ULTRASOUND (EUS) RADIAL-Will need Linear and Forward Viewing EUS Scope;  Surgeon: Milus Banister, MD;  Location: [facility];  Service: Endoscopy;  Laterality: N/A; . FINE NEEDLE ASPIRATION N/A 02/01/2019  Procedure: FINE NEEDLE ASPIRATION (FNA) LINEAR;  Surgeon: Milus Banister, MD;  Location: [facility];  Service: Endoscopy;  Laterality: N/A; . IR GASTROSTOMY TUBE MOD SED  02/09/2019 . IR IMAGING GUIDED PORT INSERTION  02/02/2019 . KNEE CARTILAGE SURGERY Right 2019 . TRACHEOSTOMY  12/02/2018  [facility] HPI: Pt is a 68 yo female referred for MBS due to h/o supraglottic cancer - diagnosed March 2020 after pt found to have respiratory distress.  She required emergent trach - and CT imaging showed supraglottic laryngeal carcinoma extending across anterior commissure and invading the prelaryngeal fat, left aryepiglottic fold and left aspect of epiglottis with severe nararowing of laryngeal airway and level 2A bilateral subcentimeter cervical lymph nodes.  Pt required PEG tube at that time and did not tolerate a PMSV due to back pressure and increased dyspnea when valve placed in March 2020.  Currently undergoing chemoradiation - radiation 5 x's week and chemo  on Thursdays.  Pt reports 2 more weeks of radiation.  She has been consuming watered down pureed foods and liquids including Boost, etc.  She denies pneumonias nor requiring heimlich manuever. MBS indicated.   Subjective: pt awake in flouro chair Assessment / Plan / Recommendation CHL IP CLINICAL IMPRESSIONS 03/17/2019 Clinical Impression Patient presents with moderate pharyngeal dysphagia due to significant edema from XRT impacting pharyngeal motility.  Due tongue base retraction results in compromised epiglottic deflection thus resulting in epiglottis residuals of liquids more than solids (mixed with secretions) that spill into airway after swallow.  Pharyngeal swallow is stronger with solids - puree/moist cracker without significant oropharyngeal residuals.  Increased pharyngeal contraction with increased viscocity.  Pt did have trace silent aspiration of thin barium that mixed with secretions and became apparent over the study.  She does NOT sense minimal trace aspiration and did not aspirate when challenged with sequential boluses of thin.  Head turn left, slight chin down did not improve airway protection.  Super supraglottic swallow attempted *with and without PMSV in place but was difficult for patient to coordinate.  Patient occluded trach with her finger when supraglottic swallow trialed without PMSV.    Although patient silently aspirated, it was only trace and included secretions. (Pt tolerated use of PMSV during MBS for 2 swallows ONLY due to report of increased dyspnea.   Encouraged her to continue to practice this super supraglottic swallow if needed in the future.  Educated her to continue po diet as tolerated.  Reviewed aspiration precautions, importance of oral care reviewed as well as recommendation to use caution with intake if she develops fevers or is overtly coughing with intake.  Further recommend patient continue neck massage prior to conducting exercises to maximize swallow exercise  effectiveness, etc.  Hopeful for some swallowing improvement several weeks after completion of radiation therapy with decreased edema.  Educated pt to importance of continuing po intake and her swallowing exercises to mitigate fibrosis and disuse muscle atrophy. Thanks for this consult.       SLP Visit Diagnosis Dysphagia, pharyngeal phase (R13.13) Attention and concentration deficit following -- Frontal lobe and executive function deficit following -- Impact on safety and function Mild aspiration risk   No flowsheet data found.  No flowsheet data found. CHL IP DIET RECOMMENDATION  03/17/2019 SLP Diet Recommendations (No Data) Liquid Administration via Cup;Straw Medication Administration Via alternative means Compensations Slow rate;Small sips/bites;Other (Comment) Postural Changes Remain semi-upright after after feeds/meals (Comment);Seated upright at 90 degrees   No flowsheet data found.  No flowsheet data found.  No flowsheet data found.     CHL IP ORAL PHASE 03/17/2019 Oral Phase WFL Oral - Pudding Teaspoon -- Oral - Pudding Cup -- Oral - Honey Teaspoon -- Oral - Honey Cup -- Oral - Nectar Teaspoon -- Oral - Nectar Cup -- Oral - Nectar Straw WFL Oral - Thin Teaspoon -- Oral - Thin Cup WFL Oral - Thin Straw WFL Oral - Puree WFL Oral - Mech Soft WFL Oral - Regular -- Oral - Multi-Consistency -- Oral - Pill -- Oral Phase - Comment --  CHL IP PHARYNGEAL PHASE 03/17/2019 Pharyngeal Phase Impaired Pharyngeal- Pudding Teaspoon -- Pharyngeal -- Pharyngeal- Pudding Cup -- Pharyngeal -- Pharyngeal- Honey Teaspoon -- Pharyngeal -- Pharyngeal- Honey Cup -- Pharyngeal -- Pharyngeal- Nectar Teaspoon -- Pharyngeal -- Pharyngeal- Nectar Cup -- Pharyngeal -- Pharyngeal- Nectar Straw WFL;Reduced airway/laryngeal closure Pharyngeal -- Pharyngeal- Thin Teaspoon -- Pharyngeal -- Pharyngeal- Thin Cup Reduced epiglottic inversion;Reduced tongue base retraction;Penetration/Aspiration during swallow;Penetration/Apiration after swallow;Trace  aspiration Pharyngeal Material enters airway, passes BELOW cords without attempt by patient to eject out (silent aspiration) Pharyngeal- Thin Straw Reduced epiglottic inversion;Penetration/Aspiration during swallow;Penetration/Apiration after swallow;Trace aspiration Pharyngeal Material enters airway, passes BELOW cords without attempt by patient to eject out (silent aspiration) Pharyngeal- Puree WFL Pharyngeal -- Pharyngeal- Mechanical Soft WFL Pharyngeal -- Pharyngeal- Regular -- Pharyngeal -- Pharyngeal- Multi-consistency -- Pharyngeal -- Pharyngeal- Pill -- Pharyngeal -- Pharyngeal Comment secretions retained mix with barium and are aspirated with trace amount of barium, chin tuck *minimal chin lowering* did not improve airway protection and dumped pyriform sinus residuals into open airway post-swallow, edematous tissue decreased pharyngeal motility, pharyngeal tongue base retraction impaired thus resulting in decreased epiglottic deflection resulting in mild resdiuals that spill into open airway after swallow, super supraglottic swallow performed with and without PMSV was difficult for pt to coordinate correctly and thus did not improve swallow safety- advised she continue to trials this strategy to use if helpful in the future, swallow  CHL IP CERVICAL ESOPHAGEAL PHASE 03/17/2019 Cervical Esophageal Phase Impaired Pudding Teaspoon -- Pudding Cup -- Honey Teaspoon -- Honey Cup -- Nectar Teaspoon -- Nectar Cup -- Nectar Straw -- Thin Teaspoon -- Thin Cup -- Thin Straw -- Puree -- Mechanical Soft -- Regular -- Multi-consistency -- Pill -- Cervical Esophageal Comment Minimal amount of distal pharyngeal residuals, did not perform esophageal sweep Macario Golds 03/17/2019, 4:41 PM  Luanna Salk, MS [facility] SLP Acute Rehab Services Pager [phone] Office [phone]     CLINICAL DATA:  68 year old female undergoing treatment for supraglottic laryngeal cancer including radiation. Tracheostomy. Dysphagia. Cough.  EXAM: MODIFIED BARIUM SWALLOW TECHNIQUE: Different consistencies of barium were administered orally to the patient by the Speech Pathologist. Imaging of the pharynx was performed in the lateral projection. The radiologist was present in the fluoroscopy room for this study, providing personal supervision. FLUOROSCOPY TIME:  Fluoroscopy Time:  2 minutes 6 seconds Radiation Exposure Index (if provided by the fluoroscopic device): 7.2 mGy Number of Acquired Spot Images: 0 COMPARISON:  Neck CT 12/02/2018. FINDINGS: Tracheostomy in place, and evidence of abundant hypopharynx and larynx edema. Flash penetration with trace aspiration, which slowly became apparent over the course of the study due to faint contrast staining of the ventral trachea, occurred with thin liquids. Only mild  retention noted otherwise. IMPRESSION: Trace aspiration which became slowly apparent over the course of the study. Please refer to the Speech Pathologists report for complete details and recommendations. Electronically Signed   By: Genevie Ann M.D.   On: 03/17/2019 14:21

## 2019-03-31 ENCOUNTER — Inpatient Hospital Stay: Payer: Medicare HMO

## 2019-03-31 ENCOUNTER — Other Ambulatory Visit: Payer: Self-pay

## 2019-03-31 ENCOUNTER — Inpatient Hospital Stay (HOSPITAL_BASED_OUTPATIENT_CLINIC_OR_DEPARTMENT_OTHER): Payer: Medicare HMO | Admitting: Hematology

## 2019-03-31 ENCOUNTER — Ambulatory Visit
Admission: RE | Admit: 2019-03-31 | Discharge: 2019-03-31 | Disposition: A | Discharge status: Home / Self Care | Payer: Medicare HMO | Source: Ambulatory Visit | Attending: Radiation Oncology | Admitting: Radiation Oncology

## 2019-03-31 ENCOUNTER — Encounter: Payer: Self-pay | Admitting: Hematology

## 2019-03-31 VITALS — BP 91/59 | HR 76 | Temp 98.9°F | Resp 18 | Ht 62.0 in

## 2019-03-31 DIAGNOSIS — C321 Malignant neoplasm of supraglottis: Secondary | ICD-10-CM

## 2019-03-31 DIAGNOSIS — T451X5A Adverse effect of antineoplastic and immunosuppressive drugs, initial encounter: Secondary | ICD-10-CM | POA: Diagnosis not present

## 2019-03-31 DIAGNOSIS — D6959 Other secondary thrombocytopenia: Secondary | ICD-10-CM

## 2019-03-31 DIAGNOSIS — D6481 Anemia due to antineoplastic chemotherapy: Secondary | ICD-10-CM

## 2019-03-31 DIAGNOSIS — E46 Unspecified protein-calorie malnutrition: Secondary | ICD-10-CM | POA: Diagnosis not present

## 2019-03-31 DIAGNOSIS — Z79899 Other long term (current) drug therapy: Secondary | ICD-10-CM

## 2019-03-31 DIAGNOSIS — E871 Hypo-osmolality and hyponatremia: Secondary | ICD-10-CM | POA: Diagnosis not present

## 2019-03-31 DIAGNOSIS — K1231 Oral mucositis (ulcerative) due to antineoplastic therapy: Secondary | ICD-10-CM

## 2019-03-31 DIAGNOSIS — Z510 Encounter for antineoplastic radiation therapy: Secondary | ICD-10-CM | POA: Diagnosis not present

## 2019-03-31 DIAGNOSIS — D701 Agranulocytosis secondary to cancer chemotherapy: Secondary | ICD-10-CM | POA: Diagnosis not present

## 2019-03-31 DIAGNOSIS — E039 Hypothyroidism, unspecified: Secondary | ICD-10-CM | POA: Diagnosis not present

## 2019-03-31 DIAGNOSIS — Z95828 Presence of other vascular implants and grafts: Secondary | ICD-10-CM

## 2019-03-31 DIAGNOSIS — I700 Atherosclerosis of aorta: Secondary | ICD-10-CM | POA: Diagnosis not present

## 2019-03-31 DIAGNOSIS — Z5111 Encounter for antineoplastic chemotherapy: Secondary | ICD-10-CM | POA: Diagnosis not present

## 2019-03-31 DIAGNOSIS — I959 Hypotension, unspecified: Secondary | ICD-10-CM | POA: Diagnosis not present

## 2019-03-31 DIAGNOSIS — R070 Pain in throat: Secondary | ICD-10-CM

## 2019-03-31 DIAGNOSIS — M542 Cervicalgia: Secondary | ICD-10-CM | POA: Diagnosis not present

## 2019-03-31 LAB — CBC WITH DIFFERENTIAL (CANCER CENTER ONLY)
Abs Immature Granulocytes: 0.01 10*3/uL (ref 0.00–0.07)
Basophils Absolute: 0 10*3/uL (ref 0.0–0.1)
Basophils Relative: 1 %
Eosinophils Absolute: 0 10*3/uL (ref 0.0–0.5)
Eosinophils Relative: 2 %
HCT: 31 % — ABNORMAL LOW (ref 36.0–46.0)
Hemoglobin: 10.4 g/dL — ABNORMAL LOW (ref 12.0–15.0)
Immature Granulocytes: 1 %
Lymphocytes Relative: 19 %
Lymphs Abs: 0.4 10*3/uL — ABNORMAL LOW (ref 0.7–4.0)
MCH: 28.1 pg (ref 26.0–34.0)
MCHC: 33.5 g/dL (ref 30.0–36.0)
MCV: 83.8 fL (ref 80.0–100.0)
Monocytes Absolute: 0.2 10*3/uL (ref 0.1–1.0)
Monocytes Relative: 12 %
Neutro Abs: 1.3 10*3/uL — ABNORMAL LOW (ref 1.7–7.7)
Neutrophils Relative %: 65 %
Platelet Count: 90 10*3/uL — ABNORMAL LOW (ref 150–400)
RBC: 3.7 MIL/uL — ABNORMAL LOW (ref 3.87–5.11)
RDW: 17.5 % — ABNORMAL HIGH (ref 11.5–15.5)
WBC Count: 1.9 10*3/uL — ABNORMAL LOW (ref 4.0–10.5)
nRBC: 0 % (ref 0.0–0.2)

## 2019-03-31 LAB — BASIC METABOLIC PANEL - CANCER CENTER ONLY
Anion gap: 9 (ref 5–15)
BUN: 19 mg/dL (ref 8–23)
CO2: 25 mmol/L (ref 22–32)
Calcium: 8.8 mg/dL — ABNORMAL LOW (ref 8.9–10.3)
Chloride: 98 mmol/L (ref 98–111)
Creatinine: 0.77 mg/dL (ref 0.44–1.00)
GFR, Est AFR Am: >60 mL/min (ref >60)
GFR, Estimated: >60 mL/min (ref >60)
Glucose, Bld: 96 mg/dL (ref 70–99)
Potassium: 4 mmol/L (ref 3.5–5.1)
Sodium: 132 mmol/L — ABNORMAL LOW (ref 135–145)

## 2019-03-31 LAB — MAGNESIUM: Magnesium: 2 mg/dL (ref 1.7–2.4)

## 2019-03-31 MED ORDER — HEPARIN SOD (PORK) LOCK FLUSH 100 UNIT/ML IV SOLN
500.0000 [IU] | Freq: Once | INTRAVENOUS | Status: AC | PRN
  Administered 2019-03-31: 10:00:00 500 [IU]
  Filled 2019-03-31: qty 5

## 2019-03-31 MED ORDER — SODIUM CHLORIDE 0.9% FLUSH
10.0000 mL | INTRAVENOUS | Status: DC | PRN
  Administered 2019-03-31: 10 mL
  Filled 2019-03-31: qty 10

## 2019-04-01 ENCOUNTER — Inpatient Hospital Stay: Payer: Medicare HMO

## 2019-04-01 ENCOUNTER — Other Ambulatory Visit: Payer: Self-pay

## 2019-04-01 ENCOUNTER — Ambulatory Visit
Admission: RE | Admit: 2019-04-01 | Discharge: 2019-04-01 | Disposition: A | Discharge status: Home / Self Care | Payer: Medicare HMO | Source: Ambulatory Visit | Attending: Radiation Oncology | Admitting: Radiation Oncology

## 2019-04-01 ENCOUNTER — Inpatient Hospital Stay: Payer: Medicare HMO | Admitting: Nutrition

## 2019-04-01 VITALS — BP 107/66 | HR 74 | Temp 97.7°F | Resp 20

## 2019-04-01 DIAGNOSIS — E039 Hypothyroidism, unspecified: Secondary | ICD-10-CM | POA: Diagnosis not present

## 2019-04-01 DIAGNOSIS — Z510 Encounter for antineoplastic radiation therapy: Secondary | ICD-10-CM | POA: Diagnosis not present

## 2019-04-01 DIAGNOSIS — K1231 Oral mucositis (ulcerative) due to antineoplastic therapy: Secondary | ICD-10-CM | POA: Diagnosis not present

## 2019-04-01 DIAGNOSIS — D701 Agranulocytosis secondary to cancer chemotherapy: Secondary | ICD-10-CM | POA: Diagnosis not present

## 2019-04-01 DIAGNOSIS — D6481 Anemia due to antineoplastic chemotherapy: Secondary | ICD-10-CM | POA: Diagnosis not present

## 2019-04-01 DIAGNOSIS — C321 Malignant neoplasm of supraglottis: Secondary | ICD-10-CM

## 2019-04-01 DIAGNOSIS — I959 Hypotension, unspecified: Secondary | ICD-10-CM | POA: Diagnosis not present

## 2019-04-01 DIAGNOSIS — T451X5A Adverse effect of antineoplastic and immunosuppressive drugs, initial encounter: Secondary | ICD-10-CM | POA: Diagnosis not present

## 2019-04-01 DIAGNOSIS — D6959 Other secondary thrombocytopenia: Secondary | ICD-10-CM | POA: Diagnosis not present

## 2019-04-01 DIAGNOSIS — Z5111 Encounter for antineoplastic chemotherapy: Secondary | ICD-10-CM | POA: Diagnosis not present

## 2019-04-01 DIAGNOSIS — M542 Cervicalgia: Secondary | ICD-10-CM | POA: Diagnosis not present

## 2019-04-01 MED ORDER — SODIUM CHLORIDE 0.9% FLUSH
10.0000 mL | INTRAVENOUS | Status: DC | PRN
  Administered 2019-04-01: 10 mL
  Filled 2019-04-01: qty 10

## 2019-04-01 MED ORDER — SODIUM CHLORIDE 0.9 % IV SOLN
40.0000 mg/m2 | Freq: Once | INTRAVENOUS | Status: AC
  Administered 2019-04-01: 72 mg via INTRAVENOUS
  Filled 2019-04-01: qty 72

## 2019-04-01 MED ORDER — PALONOSETRON HCL INJECTION 0.25 MG/5ML
INTRAVENOUS | Status: AC
  Filled 2019-04-01: qty 5

## 2019-04-01 MED ORDER — SODIUM CHLORIDE 0.9 % IV SOLN
Freq: Once | INTRAVENOUS | Status: AC
  Administered 2019-04-01: 14:00:00 via INTRAVENOUS
  Filled 2019-04-01: qty 5

## 2019-04-01 MED ORDER — PALONOSETRON HCL INJECTION 0.25 MG/5ML
0.2500 mg | Freq: Once | INTRAVENOUS | Status: AC
  Administered 2019-04-01: 0.25 mg via INTRAVENOUS

## 2019-04-01 MED ORDER — HEPARIN SOD (PORK) LOCK FLUSH 100 UNIT/ML IV SOLN
500.0000 [IU] | Freq: Once | INTRAVENOUS | Status: AC | PRN
  Administered 2019-04-01: 500 [IU]
  Filled 2019-04-01: qty 5

## 2019-04-01 MED ORDER — SODIUM CHLORIDE 0.9 % IV SOLN
Freq: Once | INTRAVENOUS | Status: AC
  Administered 2019-04-01: 12:00:00 via INTRAVENOUS
  Filled 2019-04-01: qty 250

## 2019-04-01 MED ORDER — POTASSIUM CHLORIDE 2 MEQ/ML IV SOLN
Freq: Once | INTRAVENOUS | Status: AC
  Administered 2019-04-01: 12:00:00 via INTRAVENOUS
  Filled 2019-04-01: qty 10

## 2019-04-01 NOTE — Patient Instructions (Signed)
[facility] Discharge Instructions for Patients Receiving Chemotherapy  Today you received the following chemotherapy agents Cisplatin  To help prevent nausea and vomiting after your treatment, we encourage you to take your nausea medication as directed  If you develop nausea and vomiting that is not controlled by your nausea medication, call the clinic.   BELOW ARE SYMPTOMS THAT SHOULD BE REPORTED IMMEDIATELY:  *FEVER GREATER THAN 100.5 F  *CHILLS WITH OR WITHOUT FEVER  NAUSEA AND VOMITING THAT IS NOT CONTROLLED WITH YOUR NAUSEA MEDICATION  *UNUSUAL SHORTNESS OF BREATH  *UNUSUAL BRUISING OR BLEEDING  TENDERNESS IN MOUTH AND THROAT WITH OR WITHOUT PRESENCE OF ULCERS  *URINARY PROBLEMS  *BOWEL PROBLEMS  UNUSUAL RASH Items with * indicate a potential emergency and should be followed up as soon as possible.  Feel free to call the clinic should you have any questions or concerns. The clinic phone number is [phone].  Please show the CHEMO ALERT CARD at check-in to the Emergency Department and triage nurse.   

## 2019-04-01 NOTE — Progress Notes (Signed)
RD working remotely.  Contacted Cindy for patient follow up at patient request.  Patient is tolerating 3 bottles of [employer] via feeding tube without difficulty. (order is for 4 bottles) Patient is eating very small amounts of food like pudding and drinking water. She tries to drink 2 boost daily. Last weight documented as 150.7 pounds on July 1.  Nutrition diagnosis: Unintentional weight loss cannot be evaluated.  Estimated Nutrition needs: 2000-2200 kcal, 100-115 grams protein, 2 L fluid.  Intervention: Increase [employer] [place] to 4 bottles daily and continue oral intake as tolerated. This provides 2000 kcal and 96 grams protein, greater than 90% estimated needs. Contact home care for additional TF delivery.  Monitoring, Evaluation, Goals: TF tolerance for weight maintenance.  Next Visit: To be scheduled as needed.

## 2019-04-02 ENCOUNTER — Ambulatory Visit
Admission: RE | Admit: 2019-04-02 | Discharge: 2019-04-02 | Disposition: A | Discharge status: Home / Self Care | Payer: Medicare HMO | Source: Ambulatory Visit | Attending: Radiation Oncology | Admitting: Radiation Oncology

## 2019-04-02 ENCOUNTER — Ambulatory Visit: Payer: Medicare HMO

## 2019-04-02 ENCOUNTER — Other Ambulatory Visit: Payer: Self-pay

## 2019-04-02 DIAGNOSIS — Z510 Encounter for antineoplastic radiation therapy: Secondary | ICD-10-CM | POA: Diagnosis not present

## 2019-04-02 DIAGNOSIS — C321 Malignant neoplasm of supraglottis: Secondary | ICD-10-CM | POA: Diagnosis not present

## 2019-04-05 ENCOUNTER — Ambulatory Visit
Admission: RE | Admit: 2019-04-05 | Discharge: 2019-04-05 | Disposition: A | Discharge status: Home / Self Care | Payer: Medicare HMO | Source: Ambulatory Visit | Attending: Radiation Oncology | Admitting: Radiation Oncology

## 2019-04-05 ENCOUNTER — Ambulatory Visit: Payer: Medicare HMO

## 2019-04-05 ENCOUNTER — Other Ambulatory Visit: Payer: Self-pay

## 2019-04-05 DIAGNOSIS — Z930 Tracheostomy status: Secondary | ICD-10-CM | POA: Diagnosis not present

## 2019-04-05 DIAGNOSIS — Z510 Encounter for antineoplastic radiation therapy: Secondary | ICD-10-CM | POA: Diagnosis not present

## 2019-04-05 DIAGNOSIS — C329 Malignant neoplasm of larynx, unspecified: Secondary | ICD-10-CM | POA: Diagnosis not present

## 2019-04-05 DIAGNOSIS — C321 Malignant neoplasm of supraglottis: Secondary | ICD-10-CM | POA: Diagnosis not present

## 2019-04-05 MED ORDER — SONAFINE EX EMUL
1.0000 "application " | Freq: Once | CUTANEOUS | Status: AC
  Administered 2019-04-05: 1 via TOPICAL

## 2019-04-06 ENCOUNTER — Ambulatory Visit
Admission: RE | Admit: 2019-04-06 | Discharge: 2019-04-06 | Disposition: A | Discharge status: Home / Self Care | Payer: Medicare HMO | Source: Ambulatory Visit | Attending: Radiation Oncology | Admitting: Radiation Oncology

## 2019-04-06 ENCOUNTER — Encounter: Payer: Self-pay | Admitting: *Deleted

## 2019-04-06 ENCOUNTER — Other Ambulatory Visit: Payer: Self-pay

## 2019-04-06 ENCOUNTER — Encounter: Payer: Self-pay | Admitting: Radiation Oncology

## 2019-04-06 DIAGNOSIS — C321 Malignant neoplasm of supraglottis: Secondary | ICD-10-CM | POA: Diagnosis not present

## 2019-04-06 DIAGNOSIS — Z510 Encounter for antineoplastic radiation therapy: Secondary | ICD-10-CM | POA: Diagnosis not present

## 2019-04-06 NOTE — Progress Notes (Signed)
Oncology Nurse Navigator Documentation  Met with Ms. Boylen after final RT to offer support and to celebrate end of radiation treatment.   Provided verbal/written post-RT guidance:  Importance of keeping all follow-up appts, especially those with Nutrition and SLP.  Importance of protecting treatment area from sun.  Continuation of Sonafine application 2-3 times daily until supply exhausted after which transition to OTC lotion with vitamin E. Explained my role as navigator will continue for several more months and that I will be calling and/or joining her during follow-up visits.   I encouraged her to call me with needs/concerns.    Rick Diehl, RN, BSN Head & Neck Oncology Navigator [facility] [place] [phone]  

## 2019-04-07 ENCOUNTER — Inpatient Hospital Stay: Payer: Medicare HMO | Admitting: Nutrition

## 2019-04-07 ENCOUNTER — Encounter: Payer: Self-pay | Admitting: Hematology

## 2019-04-07 ENCOUNTER — Telehealth: Payer: Self-pay | Admitting: Hematology

## 2019-04-07 ENCOUNTER — Inpatient Hospital Stay: Payer: Medicare HMO

## 2019-04-07 ENCOUNTER — Other Ambulatory Visit: Payer: Self-pay

## 2019-04-07 ENCOUNTER — Inpatient Hospital Stay (HOSPITAL_BASED_OUTPATIENT_CLINIC_OR_DEPARTMENT_OTHER): Payer: Medicare HMO | Admitting: Hematology

## 2019-04-07 VITALS — BP 97/63 | HR 87 | Temp 98.9°F | Resp 18 | Ht 62.0 in | Wt 146.7 lb

## 2019-04-07 DIAGNOSIS — R633 Feeding difficulties: Secondary | ICD-10-CM | POA: Diagnosis not present

## 2019-04-07 DIAGNOSIS — I700 Atherosclerosis of aorta: Secondary | ICD-10-CM | POA: Diagnosis not present

## 2019-04-07 DIAGNOSIS — M542 Cervicalgia: Secondary | ICD-10-CM | POA: Diagnosis not present

## 2019-04-07 DIAGNOSIS — Z79899 Other long term (current) drug therapy: Secondary | ICD-10-CM

## 2019-04-07 DIAGNOSIS — J387 Other diseases of larynx: Secondary | ICD-10-CM | POA: Diagnosis not present

## 2019-04-07 DIAGNOSIS — D701 Agranulocytosis secondary to cancer chemotherapy: Secondary | ICD-10-CM | POA: Diagnosis not present

## 2019-04-07 DIAGNOSIS — R070 Pain in throat: Secondary | ICD-10-CM | POA: Diagnosis not present

## 2019-04-07 DIAGNOSIS — T451X5A Adverse effect of antineoplastic and immunosuppressive drugs, initial encounter: Secondary | ICD-10-CM | POA: Diagnosis not present

## 2019-04-07 DIAGNOSIS — D6481 Anemia due to antineoplastic chemotherapy: Secondary | ICD-10-CM | POA: Diagnosis not present

## 2019-04-07 DIAGNOSIS — D6959 Other secondary thrombocytopenia: Secondary | ICD-10-CM

## 2019-04-07 DIAGNOSIS — Z95828 Presence of other vascular implants and grafts: Secondary | ICD-10-CM

## 2019-04-07 DIAGNOSIS — K1231 Oral mucositis (ulcerative) due to antineoplastic therapy: Secondary | ICD-10-CM | POA: Diagnosis not present

## 2019-04-07 DIAGNOSIS — I959 Hypotension, unspecified: Secondary | ICD-10-CM | POA: Diagnosis not present

## 2019-04-07 DIAGNOSIS — C321 Malignant neoplasm of supraglottis: Secondary | ICD-10-CM

## 2019-04-07 DIAGNOSIS — E46 Unspecified protein-calorie malnutrition: Secondary | ICD-10-CM | POA: Diagnosis not present

## 2019-04-07 DIAGNOSIS — Z5111 Encounter for antineoplastic chemotherapy: Secondary | ICD-10-CM | POA: Diagnosis not present

## 2019-04-07 DIAGNOSIS — J383 Other diseases of vocal cords: Secondary | ICD-10-CM | POA: Diagnosis not present

## 2019-04-07 DIAGNOSIS — Z430 Encounter for attention to tracheostomy: Secondary | ICD-10-CM | POA: Diagnosis not present

## 2019-04-07 DIAGNOSIS — E039 Hypothyroidism, unspecified: Secondary | ICD-10-CM | POA: Diagnosis not present

## 2019-04-07 LAB — CBC WITH DIFFERENTIAL (CANCER CENTER ONLY)
Abs Immature Granulocytes: 0 10*3/uL (ref 0.00–0.07)
Basophils Absolute: 0 10*3/uL (ref 0.0–0.1)
Basophils Relative: 0 %
Eosinophils Absolute: 0 10*3/uL (ref 0.0–0.5)
Eosinophils Relative: 2 %
HCT: 27.6 % — ABNORMAL LOW (ref 36.0–46.0)
Hemoglobin: 9.3 g/dL — ABNORMAL LOW (ref 12.0–15.0)
Immature Granulocytes: 0 %
Lymphocytes Relative: 23 %
Lymphs Abs: 0.2 10*3/uL — ABNORMAL LOW (ref 0.7–4.0)
MCH: 28.3 pg (ref 26.0–34.0)
MCHC: 33.7 g/dL (ref 30.0–36.0)
MCV: 83.9 fL (ref 80.0–100.0)
Monocytes Absolute: 0.1 10*3/uL (ref 0.1–1.0)
Monocytes Relative: 16 %
Neutro Abs: 0.5 10*3/uL — ABNORMAL LOW (ref 1.7–7.7)
Neutrophils Relative %: 59 %
Platelet Count: 56 10*3/uL — ABNORMAL LOW (ref 150–400)
RBC: 3.29 MIL/uL — ABNORMAL LOW (ref 3.87–5.11)
RDW: 17.7 % — ABNORMAL HIGH (ref 11.5–15.5)
WBC Count: 0.8 10*3/uL — CL (ref 4.0–10.5)
nRBC: 0 % (ref 0.0–0.2)

## 2019-04-07 LAB — BASIC METABOLIC PANEL - CANCER CENTER ONLY
Anion gap: 10 (ref 5–15)
BUN: 19 mg/dL (ref 8–23)
CO2: 24 mmol/L (ref 22–32)
Calcium: 8.5 mg/dL — ABNORMAL LOW (ref 8.9–10.3)
Chloride: 97 mmol/L — ABNORMAL LOW (ref 98–111)
Creatinine: 0.75 mg/dL (ref 0.44–1.00)
GFR, Est AFR Am: >60 mL/min (ref >60)
GFR, Estimated: >60 mL/min (ref >60)
Glucose, Bld: 100 mg/dL — ABNORMAL HIGH (ref 70–99)
Potassium: 3.9 mmol/L (ref 3.5–5.1)
Sodium: 131 mmol/L — ABNORMAL LOW (ref 135–145)

## 2019-04-07 LAB — MAGNESIUM: Magnesium: 1.8 mg/dL (ref 1.7–2.4)

## 2019-04-07 MED ORDER — HEPARIN SOD (PORK) LOCK FLUSH 100 UNIT/ML IV SOLN
500.0000 [IU] | Freq: Once | INTRAVENOUS | Status: AC | PRN
  Administered 2019-04-07: 500 [IU]
  Filled 2019-04-07: qty 5

## 2019-04-07 MED ORDER — SODIUM CHLORIDE 0.9% FLUSH
10.0000 mL | INTRAVENOUS | Status: DC | PRN
  Administered 2019-04-07: 10 mL
  Filled 2019-04-07: qty 10

## 2019-04-07 NOTE — Telephone Encounter (Signed)
Gave avs and calendar ° °

## 2019-04-07 NOTE — Progress Notes (Signed)
[place] OFFICE PROGRESS NOTE  Patient Care Team: Philmore Pali, NP as PCP - General (Nurse Practitioner) Eppie Gibson, MD as Attending Physician (Radiation Oncology) Tish Men, MD as Consulting Physician (Hematology) Leota Sauers, RN as Oncology Nurse Navigator Karie Mainland, RD as Dietitian (Nutrition) Sharen Counter, CCC-SLP as Speech Language Pathologist (Speech Pathology) HEME/ONC OVERVIEW: 1.  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 -11/2018:   3.1 x 2.8cm left supraglottic laryngeal mass extending across the anterior commissure and invading the prelaryngeal fat and suspicious <1cm bilateral Level II cervical LN's on CT  Transferred to [facility] for airway compromise and taken to OR for tracheostomy; multiple bx'es non-diagnostic -12/2018: repeat DL with bx of the supraglottic mass showed well-differentiated invasive squamous cell carcinoma  -01/2019: PET showed locally advanced disease, including bilateral cervical LN involvement; mildly FDG-avid mediastinal LN's, bx negative for malignancy -Mid-01/2019 - present: definitive chemoradiation with weekly cisplatin   2. 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 -S/p 7 doses of weekly cisplatin concurrent with RT in the definitive setting; RT completed on 7/14 -See the management of chemotherapy-related toxicities below -PRN anti-emetics: Zofran, Compazine, Ativan and dexamethasone   Chemotherapy-associated anemia -Secondary to chemotherapy -Hgb 11.3, lower than last visit -Patient denies any symptom of bleeding -We will monitor for now  Chemotherapy-associated leukopenia -Secondary to chemotherapy -WBC 0.8k with ANC 500, significantly lower than last  visit -Patient denies any symptoms of infection -I counseled the patient on the importance of monitoring for any symptoms of infection, including fever (T > 100.4), for which she is instructed to contact the clinic ASAP -No indication for G-CSF unless the patient develops severe infection  Chemotherapy-associated thrombocytopenia -Secondary to chemotherapy -Plts 56k, significantly lower than last week -Patient denies any symptoms of bleeding or excess bruising, such as epistaxis, hematochezia, melena, or hematuria -We will monitor it for now  Hyponatremia  -Na 131, stable; patient is asymptomatic -She has had occasional mild hyponatremia -I recommended the patient to change some of the free water to Pedialyte, which contains more electrolytes -We will monitor it closely   Chemotherapy-associated mucositis -Secondary to chemotherapy -Grade 2, stable  -Current regimen: IR liquid morphine 10mg /0.76mL q4hrs PRN, viscous lidocaine swish and swallow PRN and salt/soda rinses  -Pain relatively well controlled -Continue the regimen above  Protein malnutrition -Secondary to chemoradiation -Weight stable since last visit -Currently taking 3.5 cans of [employer] and 8 bottles of 8oz water per day via PEG as well as 2 bottles of Boosts by mouth -As discussed above, I recommended patient to substitute some of the free water or Pedialyte, which contains more electrolytes -I encouraged patient to adhere to nutritional recommendations as tolerated  No orders of the defined types were placed in this encounter.  All questions were answered. The patient knows to call the clinic with any problems, questions or concerns. No barriers to learning was detected.  Return in 1 week for labs, port flush, clinic appointment.  Tish Men, MD 04/07/2019 10:51 AM  CHIEF COMPLAINT: "I am done with treatment"  INTERVAL HISTORY: Ms. Gibeault returns to clinic for follow-up of squamous cell carcinoma of the  supraglottic larynx s/p definitive chemoradiation.  Patient is received the last dose of chemotherapy  on 03/31/2019, and completed her last radiation treatment on 04/06/2019.  Patient reports that she has a burning sensation in her throat, for which she has been doing viscous lidocaine as well as IR liquid morphine twice a day (once in the morning and once at night) with relatively adequate pain control.  She is currently able to drink 2 boost per day by mouth, as well as administering 3 and half cans of tube feeding and 8-10 bottles of 8 ounce water via the feeding tube.  Her weight has been relatively stable.  She denies any fever, chill, night sweats, chest pain, dyspnea, abdominal pain, nausea, vomiting, diarrhea, or abnormal bleeding/bruising.  SUMMARY OF ONCOLOGIC HISTORY: Oncology History  Malignant neoplasm of supraglottis ([place])  12/02/2018 Imaging   CT neck: IMPRESSION: 1. Supraglottic laryngeal carcinoma extending across the anterior commissure and invading the prelaryngeal fat, left aryepiglottic fold and left aspect of the epiglottis. 2. Severe narrowing of the laryngeal airway. 3. Bilateral subcentimeter level 2A and 2A cervical lymph nodes.  Critical Value/emergent results were called by telephone at the time of interpretation on 12/02/2018 at 5:34 pm to Dr. Collier Salina, who verbally acknowledged these results.   12/02/2018 Imaging   CT  Chest:  IMPRESSION: No evidence of metastatic disease in the chest.   12/03/2018 Pathology Results   (Care Everywhere; [facility]) A: Larynx, supraglottis, biopsy - Atypical squamous proliferation with at least low grade dysplasia and abundant keratinization - No definite high grade dysplasia or invasive carcinoma represented (see comment)   12/18/2018 Pathology Results   (Care Everywhere; [facility]) A:  Neck, left, fine needle aspiration - Rare single atypical cells, no cells diagnostic of malignancy - Lymph node material present  B:   Neck, right, fine needle aspiration - Non-diagnostic specimen - No lymph node material identified - Mixed inflammation and blood   12/31/2018 Pathology Results   (Care Everywhere; [facility])  A: Larynx, left supraglottic mass, biopsy - Atypical squamous proliferation with abundant keratinization, suspicious for well-differentiated invasive squamous cell carcinoma  B: Larynx, left supraglottic mass, biopsy - Well-differentiated invasive squamous cell carcinoma with abundant keratinization (see comment)   01/18/2019 Initial Diagnosis   Laryngeal cancer ([place])   01/26/2019 Imaging   PET: IMPRESSION: 1. Locally advanced hypermetabolic laryngeal neoplasm centered in the left supraglottic space with bilateral glottic and epiglottic involvement and subglottic extension on the left. 2. Hypermetabolic bilateral level 3 neck nodal metastases. 3. Hypermetabolic high mediastinal nodal metastases between the trachea and upper thoracic esophagus. 4. Otherwise no distant hypermetabolic metastatic disease. 5. Nodular 7 mm focus at the left lung base, below PET resolution, recommend attention on follow-up chest CT in 3 months. 6. Chronic findings include: Aortic Atherosclerosis (ICD10-I70.0). Cholelithiasis.   02/08/2019 Cancer Staging   Staging form: Larynx - Supraglottis, AJCC 8th Edition - Clinical stage from 02/08/2019: Stage IVA (cT3, cN2c, cM0) - Signed by Eppie Gibson, MD on 02/08/2019   02/11/2019 -  Chemotherapy   The patient had palonosetron (ALOXI) injection 0.25 mg, 0.25 mg, Intravenous,  Once, 7 of 7 cycles Administration: 0.25 mg (02/11/2019), 0.25 mg (02/18/2019), 0.25 mg (03/04/2019), 0.25 mg (03/11/2019), 0.25 mg (03/18/2019), 0.25 mg (03/25/2019), 0.25 mg (04/01/2019) CISplatin (PLATINOL) 72 mg in sodium chloride 0.9 % 250 mL chemo infusion, 40 mg/m2 = 72 mg, Intravenous,  Once, 7 of 7 cycles Administration: 72 mg (02/11/2019), 72 mg (02/18/2019), 72 mg (03/04/2019), 72 mg (03/11/2019), 72 mg  (03/18/2019), 72 mg (03/25/2019), 72 mg (04/01/2019) fosaprepitant (EMEND) 150 mg, dexamethasone (DECADRON)  12 mg in sodium chloride 0.9 % 145 mL IVPB, , Intravenous,  Once, 7 of 7 cycles Administration:  (02/11/2019),  (02/18/2019),  (03/04/2019),  (03/11/2019),  (03/18/2019),  (03/25/2019),  (04/01/2019)  for chemotherapy treatment.      REVIEW OF SYSTEMS:   Constitutional: ( - ) fevers, ( - )  chills , ( - ) night sweats Eyes: ( - ) blurriness of vision, ( - ) double vision, ( - ) watery eyes Ears, nose, mouth, throat, and face: ( + ) mucositis, ( + ) sore throat Respiratory: ( - ) cough, ( - ) dyspnea, ( - ) wheezes Cardiovascular: ( - ) palpitation, ( - ) chest discomfort, ( - ) lower extremity swelling Gastrointestinal:  ( - ) nausea, ( - ) heartburn, ( - ) change in bowel habits Skin: ( - ) abnormal skin rashes Lymphatics: ( - ) new lymphadenopathy, ( - ) easy bruising Neurological: ( - ) numbness, ( - ) tingling, ( - ) new weaknesses Behavioral/Psych: ( - ) mood change, ( - ) new changes  All other systems were reviewed with the patient and are negative.  I have reviewed the past medical history, past surgical history, social history and family history with the patient and they are unchanged from previous note.  ALLERGIES:  has No Known Allergies.  MEDICATIONS:  Current Outpatient Medications  Medication Sig Dispense Refill  . chlorhexidine (PERIDEX) 0.12 % solution Rinse with 15 mls twice daily for 30 seconds. Use after breakfast and at bedtime. Spit out excess. Do not swallow. (Patient taking differently: Use as directed 15 mLs in the mouth or throat 2 (two) times daily. Rinse with 15 mls twice daily for 30 seconds. Use after breakfast and at bedtime. Spit out excess. Do not swallow.) 480 mL prn  . docusate sodium (COLACE) 100 MG capsule Take 100 mg by mouth 2 (two) times daily.    Marland Kitchen HYDROcodone-acetaminophen (HYCET) 7.5-325 mg/15 ml solution Take 15 mLs by mouth 4 (four) times daily as  needed for moderate pain. 120 mL 0  . levothyroxine (SYNTHROID) 150 MCG tablet Take 150 mcg by mouth every other day. Alternating with 175 mcg dose    . levothyroxine (SYNTHROID) 175 MCG tablet Take 175 mcg by mouth every other day. Alternating with 150 mcg dose    . lidocaine (XYLOCAINE) 2 % solution Patient: Mix 1part 2% viscous lidocaine, 1part H20. Swallow 1mL of diluted mixture, 44min before meals and at bedtime, up to QID prn soreness 100 mL 5  . lidocaine-prilocaine (EMLA) cream Apply to affected area once 30 g 3  . LORazepam (ATIVAN) 0.5 MG tablet Take 1 tablet (0.5 mg total) by mouth every 6 (six) hours as needed (Nausea or vomiting). 30 tablet 0  . Nutritional Supplements (KATE FARMS PEPTIDE 1.5) LIQD Take 4 Bottles by mouth daily. 325 mL 6  . ondansetron (ZOFRAN) 8 MG tablet Take 1 tablet (8 mg total) by mouth 2 (two) times daily as needed. Start on the third day after chemotherapy. 30 tablet 1  . prochlorperazine (COMPAZINE) 10 MG tablet Take 1 tablet (10 mg total) by mouth every 6 (six) hours as needed (Nausea or vomiting). 30 tablet 1  . sodium fluoride (PREVIDENT 5000 PLUS) 1.1 % CREA dental cream Apply cream to tooth brush. Brush teeth for 2 minutes. Spit out excess. DO NOT rinse afterwards. Repeat nightly. (Patient taking differently: Place 1 application onto teeth 2 (two) times a day. ) 1 Tube prn  . dexamethasone (DECADRON)  4 MG tablet Take 2 tablets by mouth once a day on the day after chemotherapy and then take 2 tablets two times a day for 2 days. Take with food. (Patient not taking: Reported on 04/07/2019) 30 tablet 1   No current facility-administered medications for this visit.     PHYSICAL EXAMINATION: ECOG PERFORMANCE STATUS: 1 - Symptomatic but completely ambulatory  Today's Vitals   04/07/19 1025 04/07/19 1031  BP: 97/63   Pulse: 87   Resp: 18   Temp: 98.9 F (37.2 C)   TempSrc: Temporal   SpO2: 96%   Weight: 146 lb 11.2 oz (66.5 kg)   Height: 5\' 2"  (1.575 m)    PainSc:  0-No pain   Body mass index is 26.83 kg/m.  Filed Weights   04/07/19 1025  Weight: 146 lb 11.2 oz (66.5 kg)    GENERAL: alert, no distress and comfortable SKIN: skin color, texture, turgor are normal, no rashes or significant lesions EYES: conjunctiva are pink and non-injected, sclera clear OROPHARYNX: no exudate, no erythema; lips, buccal mucosa, and tongue normal  NECK: supple, mildly sore with palpation, tracheostomy in place  LYMPH:  Shotty cervical adenopathy bilaterally (<1cm) LUNGS: clear to auscultation with normal breathing effort HEART: regular rate & rhythm and no murmurs and no lower extremity edema ABDOMEN: soft, non-tender, non-distended, normal bowel sounds Musculoskeletal: no cyanosis of digits and no clubbing  PSYCH: alert & oriented x 3 NEURO: no focal motor/sensory deficits  LABORATORY DATA:  I have reviewed the data as listed    Component Value Date/Time   NA 132 (L) 03/31/2019 1030   K 4.0 03/31/2019 1030   CL 98 03/31/2019 1030   CO2 25 03/31/2019 1030   GLUCOSE 96 03/31/2019 1030   BUN 19 03/31/2019 1030   CREATININE 0.77 03/31/2019 1030   CALCIUM 8.8 (L) 03/31/2019 1030   PROT 6.9 02/17/2019 1000   ALBUMIN 3.4 (L) 02/17/2019 1000   AST 17 02/17/2019 1000   ALT 25 02/17/2019 1000   ALKPHOS 93 02/17/2019 1000   BILITOT 0.5 02/17/2019 1000   GFRNONAA >60 03/31/2019 1030   GFRAA >60 03/31/2019 1030    No results found for: SPEP, UPEP  Lab Results  Component Value Date   WBC 0.8 (LL) 04/07/2019   NEUTROABS PENDING 04/07/2019   HGB 9.3 (L) 04/07/2019   HCT 27.6 (L) 04/07/2019   MCV 83.9 04/07/2019   PLT 56 (L) 04/07/2019      Chemistry      Component Value Date/Time   NA 132 (L) 03/31/2019 1030   K 4.0 03/31/2019 1030   CL 98 03/31/2019 1030   CO2 25 03/31/2019 1030   BUN 19 03/31/2019 1030   CREATININE 0.77 03/31/2019 1030      Component Value Date/Time   CALCIUM 8.8 (L) 03/31/2019 1030   ALKPHOS 93 02/17/2019 1000    AST 17 02/17/2019 1000   ALT 25 02/17/2019 1000   BILITOT 0.5 02/17/2019 1000       RADIOGRAPHIC STUDIES: I have personally reviewed the radiological images as listed below and agreed with the findings in the report. Dg Carlena Hurl [facility] [employer] Speech Path  Result Date: 03/17/2019 Objective Swallowing Evaluation: Type of Study: MBS-Modified Barium Swallow Study  Patient Details Name: KYONNA FRIER MRN: [id number] Date of Birth: Jul 17, 1951 Today's Date: 03/17/2019 Time: SLP Start Time (ACUTE ONLY): 1335 -SLP Stop Time (ACUTE ONLY): 1405 SLP Time Calculation (min) (ACUTE ONLY): 30 min Past Medical History: Past Medical History: Diagnosis Date .  Cataract  . Laryngeal cancer ([place]) 11/2018 . Thyroid disease  Past Surgical History: Past Surgical History: Procedure Laterality Date . CATARACT EXTRACTION, BILATERAL  left 05/20/16, right 06/03/16  Cataract Extraction w/ IOL Implant; Surgeon: Sudie Grumbling, MD; Location: OR [facility]; Service: Ophthalmology  . CESAREAN SECTION   . ESOPHAGOGASTRODUODENOSCOPY (EGD) WITH PROPOFOL N/A 02/01/2019  Procedure: ESOPHAGOGASTRODUODENOSCOPY (EGD) WITH PROPOFOL;  Surgeon: Milus Banister, MD;  Location: [facility];  Service: Endoscopy;  Laterality: N/A; . EUS N/A 02/01/2019  Procedure: UPPER ENDOSCOPIC ULTRASOUND (EUS) RADIAL-Will need Linear and Forward Viewing EUS Scope;  Surgeon: Milus Banister, MD;  Location: [facility];  Service: Endoscopy;  Laterality: N/A; . FINE NEEDLE ASPIRATION N/A 02/01/2019  Procedure: FINE NEEDLE ASPIRATION (FNA) LINEAR;  Surgeon: Milus Banister, MD;  Location: [facility];  Service: Endoscopy;  Laterality: N/A; . IR GASTROSTOMY TUBE MOD SED  02/09/2019 . IR IMAGING GUIDED PORT INSERTION  02/02/2019 . KNEE CARTILAGE SURGERY Right 2019 . TRACHEOSTOMY  12/02/2018  [facility] HPI: Pt is a 68 yo female referred for MBS due to h/o supraglottic cancer - diagnosed March 2020 after pt found to have respiratory distress.  She required emergent trach -  and CT imaging showed supraglottic laryngeal carcinoma extending across anterior commissure and invading the prelaryngeal fat, left aryepiglottic fold and left aspect of epiglottis with severe nararowing of laryngeal airway and level 2A bilateral subcentimeter cervical lymph nodes.  Pt required PEG tube at that time and did not tolerate a PMSV due to back pressure and increased dyspnea when valve placed in March 2020.  Currently undergoing chemoradiation - radiation 5 x's week and chemo on Thursdays.  Pt reports 2 more weeks of radiation.  She has been consuming watered down pureed foods and liquids including Boost, etc.  She denies pneumonias nor requiring heimlich manuever. MBS indicated.   Subjective: pt awake in flouro chair Assessment / Plan / Recommendation CHL IP CLINICAL IMPRESSIONS 03/17/2019 Clinical Impression Patient presents with moderate pharyngeal dysphagia due to significant edema from XRT impacting pharyngeal motility.  Due tongue base retraction results in compromised epiglottic deflection thus resulting in epiglottis residuals of liquids more than solids (mixed with secretions) that spill into airway after swallow.  Pharyngeal swallow is stronger with solids - puree/moist cracker without significant oropharyngeal residuals.  Increased pharyngeal contraction with increased viscocity.  Pt did have trace silent aspiration of thin barium that mixed with secretions and became apparent over the study.  She does NOT sense minimal trace aspiration and did not aspirate when challenged with sequential boluses of thin.  Head turn left, slight chin down did not improve airway protection.  Super supraglottic swallow attempted *with and without PMSV in place but was difficult for patient to coordinate.  Patient occluded trach with her finger when supraglottic swallow trialed without PMSV.    Although patient silently aspirated, it was only trace and included secretions. (Pt tolerated use of PMSV during MBS for 2  swallows ONLY due to report of increased dyspnea.   Encouraged her to continue to practice this super supraglottic swallow if needed in the future.  Educated her to continue po diet as tolerated.  Reviewed aspiration precautions, importance of oral care reviewed as well as recommendation to use caution with intake if she develops fevers or is overtly coughing with intake.  Further recommend patient continue neck massage prior to conducting exercises to maximize swallow exercise effectiveness, etc.  Hopeful for some swallowing improvement several weeks after completion of radiation therapy with decreased edema.  Educated pt to importance of continuing po intake and her swallowing exercises to mitigate fibrosis and disuse muscle atrophy. Thanks for this consult.       SLP Visit Diagnosis Dysphagia, pharyngeal phase (R13.13) Attention and concentration deficit following -- Frontal lobe and executive function deficit following -- Impact on safety and function Mild aspiration risk   No flowsheet data found.  No flowsheet data found. CHL IP DIET RECOMMENDATION 03/17/2019 SLP Diet Recommendations (No Data) Liquid Administration via Cup;Straw Medication Administration Via alternative means Compensations Slow rate;Small sips/bites;Other (Comment) Postural Changes Remain semi-upright after after feeds/meals (Comment);Seated upright at 90 degrees   No flowsheet data found.  No flowsheet data found.  No flowsheet data found.     CHL IP ORAL PHASE 03/17/2019 Oral Phase WFL Oral - Pudding Teaspoon -- Oral - Pudding Cup -- Oral - Honey Teaspoon -- Oral - Honey Cup -- Oral - Nectar Teaspoon -- Oral - Nectar Cup -- Oral - Nectar Straw WFL Oral - Thin Teaspoon -- Oral - Thin Cup WFL Oral - Thin Straw WFL Oral - Puree WFL Oral - Mech Soft WFL Oral - Regular -- Oral - Multi-Consistency -- Oral - Pill -- Oral Phase - Comment --  CHL IP PHARYNGEAL PHASE 03/17/2019 Pharyngeal Phase Impaired Pharyngeal- Pudding Teaspoon -- Pharyngeal --  Pharyngeal- Pudding Cup -- Pharyngeal -- Pharyngeal- Honey Teaspoon -- Pharyngeal -- Pharyngeal- Honey Cup -- Pharyngeal -- Pharyngeal- Nectar Teaspoon -- Pharyngeal -- Pharyngeal- Nectar Cup -- Pharyngeal -- Pharyngeal- Nectar Straw WFL;Reduced airway/laryngeal closure Pharyngeal -- Pharyngeal- Thin Teaspoon -- Pharyngeal -- Pharyngeal- Thin Cup Reduced epiglottic inversion;Reduced tongue base retraction;Penetration/Aspiration during swallow;Penetration/Apiration after swallow;Trace aspiration Pharyngeal Material enters airway, passes BELOW cords without attempt by patient to eject out (silent aspiration) Pharyngeal- Thin Straw Reduced epiglottic inversion;Penetration/Aspiration during swallow;Penetration/Apiration after swallow;Trace aspiration Pharyngeal Material enters airway, passes BELOW cords without attempt by patient to eject out (silent aspiration) Pharyngeal- Puree WFL Pharyngeal -- Pharyngeal- Mechanical Soft WFL Pharyngeal -- Pharyngeal- Regular -- Pharyngeal -- Pharyngeal- Multi-consistency -- Pharyngeal -- Pharyngeal- Pill -- Pharyngeal -- Pharyngeal Comment secretions retained mix with barium and are aspirated with trace amount of barium, chin tuck *minimal chin lowering* did not improve airway protection and dumped pyriform sinus residuals into open airway post-swallow, edematous tissue decreased pharyngeal motility, pharyngeal tongue base retraction impaired thus resulting in decreased epiglottic deflection resulting in mild resdiuals that spill into open airway after swallow, super supraglottic swallow performed with and without PMSV was difficult for pt to coordinate correctly and thus did not improve swallow safety- advised she continue to trials this strategy to use if helpful in the future, swallow  CHL IP CERVICAL ESOPHAGEAL PHASE 03/17/2019 Cervical Esophageal Phase Impaired Pudding Teaspoon -- Pudding Cup -- Honey Teaspoon -- Honey Cup -- Nectar Teaspoon -- Nectar Cup -- Nectar Straw -- Thin  Teaspoon -- Thin Cup -- Thin Straw -- Puree -- Mechanical Soft -- Regular -- Multi-consistency -- Pill -- Cervical Esophageal Comment Minimal amount of distal pharyngeal residuals, did not perform esophageal sweep Macario Golds 03/17/2019, 4:41 PM  Luanna Salk, MS [facility] SLP Acute Rehab Services Pager [phone] Office [phone]     CLINICAL DATA:  68 year old female undergoing treatment for supraglottic laryngeal cancer including radiation. Tracheostomy. Dysphagia. Cough. EXAM: MODIFIED BARIUM SWALLOW TECHNIQUE: Different consistencies of barium were administered orally to the patient by the Speech Pathologist. Imaging of the pharynx was performed in the lateral projection. The radiologist was present in the fluoroscopy room for this study, providing personal supervision. FLUOROSCOPY  TIME:  Fluoroscopy Time:  2 minutes 6 seconds Radiation Exposure Index (if provided by the fluoroscopic device): 7.2 mGy Number of Acquired Spot Images: 0 COMPARISON:  Neck CT 12/02/2018. FINDINGS: Tracheostomy in place, and evidence of abundant hypopharynx and larynx edema. Flash penetration with trace aspiration, which slowly became apparent over the course of the study due to faint contrast staining of the ventral trachea, occurred with thin liquids. Only mild retention noted otherwise. IMPRESSION: Trace aspiration which became slowly apparent over the course of the study. Please refer to the Speech Pathologists report for complete details and recommendations. Electronically Signed   By: Genevie Ann M.D.   On: 03/17/2019 14:21

## 2019-04-07 NOTE — Progress Notes (Signed)
Nutrition follow-up completed with patient's friend [name] at her request. Patient is tolerating 3-1/2 bottles Dillard Essex daily. She continues to try to drink 2 boost daily. Noted patient is agreeable to drinking Pedialyte to help with increasing electrolytes. Weight decreased again to 146.7 pounds down from 150.7 pounds July 1.  Nutrition diagnosis: Unintentional weight loss continues.  Estimated nutrition needs: 2000-2200 cal, 100-115 g protein, 2.0 L fluid.  Intervention: Recommended patient increase Dillard Essex to 4 bottles daily providing 2000 cal and 96 g protein. She should continue to drink boost twice daily, preferably boost plus. Continue tube feedings through [facility].  Monitoring, evaluation, goals: Patient will increase tube feeding plus oral intake to minimize further weight loss.  Next visit: Patient will contact me for any further questions or concerns.  **Disclaimer: This note was dictated with voice recognition software. Similar sounding words can inadvertently be transcribed and this note may contain transcription errors which may not have been corrected upon publication of note.**

## 2019-04-08 DIAGNOSIS — J383 Other diseases of vocal cords: Secondary | ICD-10-CM | POA: Diagnosis not present

## 2019-04-08 DIAGNOSIS — J387 Other diseases of larynx: Secondary | ICD-10-CM | POA: Diagnosis not present

## 2019-04-08 DIAGNOSIS — Z430 Encounter for attention to tracheostomy: Secondary | ICD-10-CM | POA: Diagnosis not present

## 2019-04-12 ENCOUNTER — Ambulatory Visit: Payer: Medicare HMO

## 2019-04-13 ENCOUNTER — Ambulatory Visit: Payer: Medicare HMO | Attending: Radiation Oncology

## 2019-04-13 DIAGNOSIS — R1312 Dysphagia, oropharyngeal phase: Secondary | ICD-10-CM | POA: Insufficient documentation

## 2019-04-13 NOTE — Therapy (Signed)
[facility] [address] [place], [place], [address] Phone: [phone]   Fax:  [phone]  Speech Language Pathology Treatment  Patient Details  Name: [name] [name] MRN: [id number] Date of Birth: Dec 31, 1950 Referring Provider (SLP): Eppie Gibson, MD   Encounter Date: 04/13/2019  End of Session - 04/13/19 1424    Visit Number  3    Number of Visits  4    Date for SLP Re-Evaluation  05/03/19    SLP Start Time  3785    SLP Stop Time   1345    SLP Time Calculation (min)  38 min    Activity Tolerance  Patient tolerated treatment well       Past Medical History:  Diagnosis Date  . Cataract   . Laryngeal cancer ([place]) 11/2018  . Thyroid disease     Past Surgical History:  Procedure Laterality Date  . CATARACT EXTRACTION, BILATERAL  left 05/20/16, right 06/03/16   Cataract Extraction w/ IOL Implant; Surgeon: Sudie Grumbling, MD; Location: [facility]; Service: Ophthalmology   . CESAREAN SECTION    . ESOPHAGOGASTRODUODENOSCOPY (EGD) WITH PROPOFOL N/A 02/01/2019   Procedure: ESOPHAGOGASTRODUODENOSCOPY (EGD) WITH PROPOFOL;  Surgeon: Milus Banister, MD;  Location: [facility];  Service: Endoscopy;  Laterality: N/A;  . EUS N/A 02/01/2019   Procedure: UPPER ENDOSCOPIC ULTRASOUND (EUS) RADIAL-Will need Linear and Forward Viewing EUS Scope;  Surgeon: Milus Banister, MD;  Location: [facility];  Service: Endoscopy;  Laterality: N/A;  . FINE NEEDLE ASPIRATION N/A 02/01/2019   Procedure: FINE NEEDLE ASPIRATION (FNA) LINEAR;  Surgeon: Milus Banister, MD;  Location: [facility];  Service: Endoscopy;  Laterality: N/A;  . IR GASTROSTOMY TUBE MOD SED  02/09/2019  . IR IMAGING GUIDED PORT INSERTION  02/02/2019  . KNEE CARTILAGE SURGERY Right 2019  . TRACHEOSTOMY  12/02/2018   [facility]    There were no vitals filed for this visit.  Subjective Assessment - 04/13/19 1322    Subjective  Pt is aphonic - Cyndi speaking for  pt today.    Patient is accompained by:  --   Cyndi - partner   Currently in Pain?  Yes    Pain Score  6     Pain Location  Throat    Pain Orientation  Mid    Pain Descriptors / Indicators  Burning    Pain Type  Acute pain            ADULT SLP TREATMENT - 04/13/19 1324      General Information   Behavior/Cognition  Alert;Cooperative      Treatment Provided   Treatment provided  Dysphagia      Dysphagia Treatment   Temperature Spikes Noted  No    Respiratory Status  Trach    Treatment Methods  Patient/caregiver education    Other treatment/comments  Webex was attempted however tech prohibited from being used so telephone call was initiated with pt and partner. Pt indicated she was unable to use PMSV due to no sound when attempting voice. SLP educated/re-educated pt and partner about edema causing voicing to temporarily be difficult around time of last rad tx, and voicing should become easier as edema subsides. SLP told pt ~3-week post rad tx as a good timeline to expect improvement in voice production using PMSV. She voiced understanding. Pt reported ([facility]) that she has been drinking water routinely each day but food and other thin liquids have been difficult due to burning. SLP told pt  to stay away from acidic foods (vinegar or tomato based, as well as citrus). SLP suggested bland puree-type foods that could be thinned if necessary. Pt stated she was completing each exercise prescribed but with limited reps than prescribed. SLP encouraged pt to so as many reps as she can and she should be able to incr reps to prescribed # as time passes. Pt req'd cues to tell SLP why she was completing HEP.      Assessment / Recommendations / Plan   Plan  Continue with current plan of care      Dysphagia Recommendations   Diet recommendations  --   as tolerated   Medication Administration  --   as tolerated     Progression Toward Goals   Progression toward goals  Progressing toward  goals       SLP Education - 04/13/19 1424    Education Details  Use of PMSV should become easier over the next 2-3 weeks, types of food to begin to try, overt s/s aspiration PNA    Person(s) Educated  Patient;Other (comment)    Methods  Handout;Explanation    Comprehension  Verbalized understanding;Need further instruction       SLP Short Term Goals - 04/13/19 1403      SLP SHORT TERM GOAL #1   Title  pt will demo understanding of correct procedure for swallow HEP with rare min A over two sessions    Time  1    Period  --   sessions, for all STGs   Status  On-going      SLP SHORT TERM GOAL #2   Title  pt will tell SLP why she is completing HEP over two sessions    Time  1    Status  On-going      SLP SHORT TERM GOAL #3   Title  pt will tell SLP 3 overt s/s aspiration PNA    Status  Achieved       SLP Long Term Goals - 04/13/19 1403      SLP LONG TERM GOAL #1   Title  pt will demo understanding of correct procedure for swallow HEP over two sessions    Time  2    Period  --   sessions, for all LTGs   Status  On-going      SLP LONG TERM GOAL #2   Title  pt will tell how a food journal can assist return to a full-PO diet least restrictive to pt    Time  2    Period  --   sessions   Status  On-going       Plan - 04/13/19 1502    Clinical Impression Statement  This visit was completed via telephone at the direction of medical director Dr. Eppie Gibson due to additional pt safety necessary from the COVID-19 pandemic. Pt's POs are drinking water only, and pt does not report issues with coughing/choking with POs. No overt s/s aspiration PNA were noted today, as best as could be ascertained over the telephone. The overt s/sx were expalined to pt and partner. She will receive a hard copy tomorrow at [facility]. The probability of swallowing difficulty increases dramatically with the initiation of chemo and radiation therapy. Pt will need to be followed by SLP for regular assessment  of accurate HEP completion as well as for safety with POs both during and following treatment/s. Visits will be conducted via telephone or telehealth, remotely, until otherwise directed by medical  director Dr. Eppie Gibson.    Speech Therapy Frequency  --   approx once every 4 weeks   Duration  --   3-4 visits in 90 days   Treatment/Interventions  Aspiration precaution training;Pharyngeal strengthening exercises;Diet toleration management by SLP;Compensatory techniques;SLP instruction and feedback;Patient/family education    Potential to Achieve Goals  Good    SLP Home Exercise Plan  provided today - Effortful, [place], and chin tuck against resistance    Consulted and Agree with Plan of Care  Patient       Patient will benefit from skilled therapeutic intervention in order to improve the following deficits and impairments:   1. Dysphagia, oropharyngeal phase       Problem List Patient Active Problem List   Diagnosis Date Noted  . Hyponatremia 03/31/2019  . Chemotherapy-induced thrombocytopenia 03/24/2019  . Leukopenia due to antineoplastic chemotherapy ([place]) 03/10/2019  . Anemia due to antineoplastic chemotherapy 03/03/2019  . Protein malnutrition ([place]) 03/03/2019  . Mucositis due to chemotherapy 02/17/2019  . Port-A-Cath in place 02/10/2019  . 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 [place], [place], [address] Phone: [phone]   Fax:  [phone]   Name: [name] [name] MRN: [id number] Date of Birth: May 05, 1951

## 2019-04-13 NOTE — Progress Notes (Signed)
Patient Name: [name] [name] MRN: [id number] DOB: Mar 24, 1951 Referring Physician: Ileene Rubens Date of Service: 04/06/2019 [facility], [place]                                                        End Of Treatment Note  Diagnoses: C32.0-Malignant neoplasm of glottis C32.1-Malignant neoplasm of supraglottis  Cancer Staging Malignant neoplasm of supraglottis [facility]) Staging form: Larynx - Supraglottis, AJCC 8th Edition - Clinical stage from 02/08/2019: Stage IVA (cT3, cN2c, cM0) - Signed by Eppie Gibson, MD on 02/08/2019  Intent: Curative  Radiation Treatment Dates: 02/11/2019 through 04/06/2019 Site Technique Total Dose Dose per Fx Completed Fx Beam Energies  Head & neck: HN_larynx; Bilateral neck nodes and upper mediastinum included. IMRT 70/70 2 35/35 6X   Narrative: The patient tolerated radiation therapy relatively well. She developed fatigue and sore throat as she progressed through treatment. She is drinking two Ensures daily and instilling 3 nutritional supplements per day via her PEG tube. Her skin is erythematous with some dry desquamation. She is applying Sonafine to this area and was given Neosporin.  Plan: The patient will follow-up with radiation oncology in two weeks.  ________________________________________________  Eppie Gibson, MD  This document serves as a record of services personally performed by Eppie Gibson, MD. It was created on her behalf by Rae Lips, a trained medical scribe. The creation of this record is based on the scribe's personal observations and the provider's statements to them. This document has been checked and approved by the attending provider.

## 2019-04-14 ENCOUNTER — Encounter: Payer: Self-pay | Admitting: Hematology

## 2019-04-14 ENCOUNTER — Telehealth: Payer: Self-pay | Admitting: Hematology

## 2019-04-14 ENCOUNTER — Inpatient Hospital Stay: Payer: Medicare HMO

## 2019-04-14 ENCOUNTER — Telehealth: Payer: Self-pay | Admitting: *Deleted

## 2019-04-14 ENCOUNTER — Other Ambulatory Visit: Payer: Self-pay

## 2019-04-14 ENCOUNTER — Inpatient Hospital Stay (HOSPITAL_BASED_OUTPATIENT_CLINIC_OR_DEPARTMENT_OTHER): Payer: Medicare HMO | Admitting: Hematology

## 2019-04-14 VITALS — BP 101/63 | HR 79 | Temp 98.0°F | Resp 18 | Ht 62.0 in | Wt 147.7 lb

## 2019-04-14 DIAGNOSIS — Z5111 Encounter for antineoplastic chemotherapy: Secondary | ICD-10-CM | POA: Diagnosis not present

## 2019-04-14 DIAGNOSIS — E871 Hypo-osmolality and hyponatremia: Secondary | ICD-10-CM | POA: Diagnosis not present

## 2019-04-14 DIAGNOSIS — E46 Unspecified protein-calorie malnutrition: Secondary | ICD-10-CM

## 2019-04-14 DIAGNOSIS — E039 Hypothyroidism, unspecified: Secondary | ICD-10-CM | POA: Diagnosis not present

## 2019-04-14 DIAGNOSIS — Z79899 Other long term (current) drug therapy: Secondary | ICD-10-CM

## 2019-04-14 DIAGNOSIS — D6481 Anemia due to antineoplastic chemotherapy: Secondary | ICD-10-CM

## 2019-04-14 DIAGNOSIS — D701 Agranulocytosis secondary to cancer chemotherapy: Secondary | ICD-10-CM

## 2019-04-14 DIAGNOSIS — I700 Atherosclerosis of aorta: Secondary | ICD-10-CM

## 2019-04-14 DIAGNOSIS — D6959 Other secondary thrombocytopenia: Secondary | ICD-10-CM | POA: Diagnosis not present

## 2019-04-14 DIAGNOSIS — K1231 Oral mucositis (ulcerative) due to antineoplastic therapy: Secondary | ICD-10-CM

## 2019-04-14 DIAGNOSIS — C321 Malignant neoplasm of supraglottis: Secondary | ICD-10-CM

## 2019-04-14 DIAGNOSIS — R070 Pain in throat: Secondary | ICD-10-CM | POA: Diagnosis not present

## 2019-04-14 DIAGNOSIS — Z95828 Presence of other vascular implants and grafts: Secondary | ICD-10-CM

## 2019-04-14 DIAGNOSIS — M542 Cervicalgia: Secondary | ICD-10-CM

## 2019-04-14 DIAGNOSIS — T451X5A Adverse effect of antineoplastic and immunosuppressive drugs, initial encounter: Secondary | ICD-10-CM | POA: Diagnosis not present

## 2019-04-14 DIAGNOSIS — I959 Hypotension, unspecified: Secondary | ICD-10-CM | POA: Diagnosis not present

## 2019-04-14 LAB — CBC WITH DIFFERENTIAL (CANCER CENTER ONLY)
Abs Immature Granulocytes: 0.01 10*3/uL (ref 0.00–0.07)
Basophils Absolute: 0 10*3/uL (ref 0.0–0.1)
Basophils Relative: 0 %
Eosinophils Absolute: 0 10*3/uL (ref 0.0–0.5)
Eosinophils Relative: 2 %
HCT: 25.6 % — ABNORMAL LOW (ref 36.0–46.0)
Hemoglobin: 8.6 g/dL — ABNORMAL LOW (ref 12.0–15.0)
Immature Granulocytes: 1 %
Lymphocytes Relative: 30 %
Lymphs Abs: 0.3 10*3/uL — ABNORMAL LOW (ref 0.7–4.0)
MCH: 28.7 pg (ref 26.0–34.0)
MCHC: 33.6 g/dL (ref 30.0–36.0)
MCV: 85.3 fL (ref 80.0–100.0)
Monocytes Absolute: 0.4 10*3/uL (ref 0.1–1.0)
Monocytes Relative: 31 %
Neutro Abs: 0.4 10*3/uL — CL (ref 1.7–7.7)
Neutrophils Relative %: 36 %
Platelet Count: 116 10*3/uL — ABNORMAL LOW (ref 150–400)
RBC: 3 MIL/uL — ABNORMAL LOW (ref 3.87–5.11)
RDW: 19.7 % — ABNORMAL HIGH (ref 11.5–15.5)
WBC Count: 1.1 10*3/uL — ABNORMAL LOW (ref 4.0–10.5)
nRBC: 0 % (ref 0.0–0.2)

## 2019-04-14 LAB — BASIC METABOLIC PANEL - CANCER CENTER ONLY
Anion gap: 11 (ref 5–15)
BUN: 19 mg/dL (ref 8–23)
CO2: 23 mmol/L (ref 22–32)
Calcium: 9.1 mg/dL (ref 8.9–10.3)
Chloride: 100 mmol/L (ref 98–111)
Creatinine: 0.85 mg/dL (ref 0.44–1.00)
GFR, Est AFR Am: >60 mL/min (ref >60)
GFR, Estimated: >60 mL/min (ref >60)
Glucose, Bld: 90 mg/dL (ref 70–99)
Potassium: 4.1 mmol/L (ref 3.5–5.1)
Sodium: 134 mmol/L — ABNORMAL LOW (ref 135–145)

## 2019-04-14 LAB — MAGNESIUM: Magnesium: 2.2 mg/dL (ref 1.7–2.4)

## 2019-04-14 MED ORDER — MORPHINE SULFATE (CONCENTRATE) 10 MG /0.5 ML PO SOLN: 10.0000 mg | Freq: Four times a day (QID) | ORAL | 0 refills | Status: AC | PRN

## 2019-04-14 MED ORDER — SODIUM CHLORIDE 0.9% FLUSH
10.0000 mL | INTRAVENOUS | Status: DC | PRN
  Administered 2019-04-14: 10 mL
  Filled 2019-04-14: qty 10

## 2019-04-14 MED ORDER — HEPARIN SOD (PORK) LOCK FLUSH 100 UNIT/ML IV SOLN
500.0000 [IU] | Freq: Once | INTRAVENOUS | Status: AC | PRN
  Administered 2019-04-14: 500 [IU]
  Filled 2019-04-14: qty 5

## 2019-04-14 NOTE — Telephone Encounter (Signed)
Gave avs and calendar ° °

## 2019-04-14 NOTE — Progress Notes (Signed)
[name] [name]  Patient Care Team: [name] Pali, NP as PCP - General (Nurse Practitioner) Eppie Gibson, MD as Attending Physician (Radiation Oncology) [name] Men, MD as Consulting Physician (Hematology) [name] Sauers, RN as Oncology Nurse Navigator [name] [name], RD as Dietitian (Nutrition) [name] [name], CCC-SLP as Speech Language Pathologist (Speech Pathology)  HEME/ONC OVERVIEW: 1.  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 -11/2018:   3.1 x 2.8cm left supraglottic laryngeal mass extending across the anterior commissure and invading the prelaryngeal fat and suspicious <1cm bilateral Level II cervical LN's on CT  Transferred to [facility] for airway compromise and taken to OR for tracheostomy; multiple bx'es non-diagnostic -12/2018: repeat DL with bx of the supraglottic mass showed well-differentiated invasive squamous cell carcinoma  -01/2019: PET showed locally advanced disease, including bilateral cervical LN involvement; mildly FDG-avid mediastinal LN's, bx negative for malignancy -Mid-01/2019 - 03/2019: definitive chemoradiation with weekly cisplatin x 7 doses  2. 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 Constance Holster in mid-04/2019 for tracheostomy management and evaluation of the primary supraglottic tumor  -We will tentatively plan to obtain PET in 3-4 months to assess disease response  -See the management of chemotherapy-related toxicities below -PRN anti-emetics: Zofran, Compazine, Ativan and  dexamethasone   Chemotherapy-associated anemia -Secondary to chemotherapy -Hgb 8.6, slightly lower than last week -Patient denies any symptom of bleeding -We will monitor for now  Chemotherapy-associated leukopenia -Secondary to chemotherapy -WBC 1.1k with ANC 400, remaining severely low  -Patient denies any symptoms of infection -I counseled the patient on the importance of monitoring for any symptoms of infection, including fever (T > 100.4), for which she is instructed to contact the clinic ASAP -No indication for G-CSF unless the patient develops severe infection  Chemotherapy-associated thrombocytopenia -Secondary to chemotherapy -Plts 116k, improving  -Patient denies any symptoms of bleeding or excess bruising, such as epistaxis, hematochezia, melena, or hematuria -We will monitor it for now  Hyponatremia  -Na 134, improving -She has had occasional mild hyponatremia -Continue periodic supplementation with Pedialyte, and maintain adequate hydration  -We will monitor it closely   Chemotherapy-associated mucositis -Secondary to chemotherapy -Grade 2, stable  -Current regimen: IR liquid morphine 10mg /0.62mL q4hrs PRN, viscous lidocaine swish and swallow PRN and salt/soda rinses  -Pain relatively well controlled -I have refilled IR liquid morphine today   Protein malnutrition -Secondary to chemoradiation -Weight stable since last visit -Currently taking 3.5 cans of [employer] via the feeding tube as well as several bottles of water via feeding tube and by mouth -I encouraged patient to adhere to nutritional recommendations, and as her mucositis continues to improve, she may also resume oral intake as tolerated   Orders Placed This Encounter  Procedures  . CBC with Differential (Cancer Center Only)    Standing Status:   Future    Standing Expiration Date:   05/18/2020  . CMP ([place] only)    Standing Status:   Future    Standing Expiration Date:   05/18/2020  .  Magnesium  Standing Status:   Future    Standing Expiration Date:   05/18/2020  . TSH    Standing Status:   Future    Standing Expiration Date:   04/13/2020  . T4, free    Standing Status:   Future    Standing Expiration Date:   04/13/2020   All questions were answered. The patient knows to call the clinic with any problems, questions or concerns. No barriers to learning was detected.  Return in 1 week to monitor severe leukopenia and neutropenia.   [name] Men, MD 04/14/2019 9:02 AM  CHIEF COMPLAINT: "My neck is sore"  INTERVAL HISTORY: [name] [name] returns to clinic for follow-up of squamous cell carcinoma of the supraglottic larynx s/p definitive chemoradiation.  Patient reports that she has had mild to moderate persistent pain in her throat, for which she has been taking IR liquid morphine 3-4 times a day with relatively adequate pain control.  She has been able to drink some water by mouth, and continues to administer 3 to 4 cans of [facility] with water flushes via feeding tube.  Her weight has been stable.  She still has periodic sputum production with scant blood near the tracheostomy in the morning, but she denies any persistent or frank hemoptysis.  She denies any fever, chill, night sweats, chest pain, dyspnea nausea, vomiting, or diarrhea.   SUMMARY OF ONCOLOGIC HISTORY: Oncology History  Malignant neoplasm of supraglottis ([place])  12/02/2018 Imaging   CT neck: IMPRESSION: 1. Supraglottic laryngeal carcinoma extending across the anterior commissure and invading the prelaryngeal fat, left aryepiglottic fold and left aspect of the epiglottis. 2. Severe narrowing of the laryngeal airway. 3. Bilateral subcentimeter level 2A and 2A cervical lymph nodes.  Critical Value/emergent results were called by telephone at the time of interpretation on 12/02/2018 at 5:34 pm to Dr. Collier Salina, who verbally acknowledged these results.   12/02/2018 Imaging   CT  Chest:  IMPRESSION: No  evidence of metastatic disease in the chest.   12/03/2018 Pathology Results   (Care Everywhere; [facility]) A: Larynx, supraglottis, biopsy - Atypical squamous proliferation with at least low grade dysplasia and abundant keratinization - No definite high grade dysplasia or invasive carcinoma represented (see comment)   12/18/2018 Pathology Results   (Care Everywhere; [facility]) A:  Neck, left, fine needle aspiration - Rare single atypical cells, no cells diagnostic of malignancy - Lymph node material present  B:  Neck, right, fine needle aspiration - Non-diagnostic specimen - No lymph node material identified - Mixed inflammation and blood   12/31/2018 Pathology Results   (Care Everywhere; [facility])  A: Larynx, left supraglottic mass, biopsy - Atypical squamous proliferation with abundant keratinization, suspicious for well-differentiated invasive squamous cell carcinoma  B: Larynx, left supraglottic mass, biopsy - Well-differentiated invasive squamous cell carcinoma with abundant keratinization (see comment)   01/18/2019 Initial Diagnosis   Laryngeal cancer ([place])   01/26/2019 Imaging   PET: IMPRESSION: 1. Locally advanced hypermetabolic laryngeal neoplasm centered in the left supraglottic space with bilateral glottic and epiglottic involvement and subglottic extension on the left. 2. Hypermetabolic bilateral level 3 neck nodal metastases. 3. Hypermetabolic high mediastinal nodal metastases between the trachea and upper thoracic esophagus. 4. Otherwise no distant hypermetabolic metastatic disease. 5. Nodular 7 mm focus at the left lung base, below PET resolution, recommend attention on follow-up chest CT in 3 months. 6. Chronic findings include: Aortic Atherosclerosis (ICD10-I70.0). Cholelithiasis.   02/08/2019 Cancer Staging   Staging form: Larynx - Supraglottis, AJCC  8th Edition - Clinical stage from 02/08/2019: Stage IVA (cT3, cN2c, cM0) - Signed by Eppie Gibson, MD  on 02/08/2019   02/11/2019 -  Chemotherapy   The patient had palonosetron (ALOXI) injection 0.25 mg, 0.25 mg, Intravenous,  Once, 7 of 7 cycles Administration: 0.25 mg (02/11/2019), 0.25 mg (02/18/2019), 0.25 mg (03/04/2019), 0.25 mg (03/11/2019), 0.25 mg (03/18/2019), 0.25 mg (03/25/2019), 0.25 mg (04/01/2019) CISplatin (PLATINOL) 72 mg in sodium chloride 0.9 % 250 mL chemo infusion, 40 mg/m2 = 72 mg, Intravenous,  Once, 7 of 7 cycles Administration: 72 mg (02/11/2019), 72 mg (02/18/2019), 72 mg (03/04/2019), 72 mg (03/11/2019), 72 mg (03/18/2019), 72 mg (03/25/2019), 72 mg (04/01/2019) fosaprepitant (EMEND) 150 mg, dexamethasone (DECADRON) 12 mg in sodium chloride 0.9 % 145 mL IVPB, , Intravenous,  Once, 7 of 7 cycles Administration:  (02/11/2019),  (02/18/2019),  (03/04/2019),  (03/11/2019),  (03/18/2019),  (03/25/2019),  (04/01/2019)  for chemotherapy treatment.      REVIEW OF SYSTEMS:   Constitutional: ( - ) fevers, ( - )  chills , ( - ) night sweats Eyes: ( - ) blurriness of vision, ( - ) double vision, ( - ) watery eyes Ears, nose, mouth, throat, and face: ( + ) mucositis, ( + ) sore throat Respiratory: ( - ) cough, ( - ) dyspnea, ( - ) wheezes Cardiovascular: ( - ) palpitation, ( - ) chest discomfort, ( - ) lower extremity swelling Gastrointestinal:  ( - ) nausea, ( - ) heartburn, ( - ) change in bowel habits Skin: ( - ) abnormal skin rashes Lymphatics: ( - ) new lymphadenopathy, ( - ) easy bruising Neurological: ( - ) numbness, ( - ) tingling, ( - ) new weaknesses Behavioral/Psych: ( - ) mood change, ( - ) new changes  All other systems were reviewed with the patient and are negative.  I have reviewed the past medical history, past surgical history, social history and family history with the patient and they are unchanged from previous [name].  ALLERGIES:  has No Known Allergies.  MEDICATIONS:  Current Outpatient Medications  Medication Sig Dispense Refill  . chlorhexidine (PERIDEX) 0.12 % solution Rinse  with 15 mls twice daily for 30 seconds. Use after breakfast and at bedtime. Spit out excess. Do not swallow. (Patient taking differently: Use as directed 15 mLs in the mouth or throat 2 (two) times daily. Rinse with 15 mls twice daily for 30 seconds. Use after breakfast and at bedtime. Spit out excess. Do not swallow.) 480 mL prn  . docusate sodium (COLACE) 100 MG capsule Take 100 mg by mouth 2 (two) times daily.    Marland Kitchen HYDROcodone-acetaminophen (HYCET) 7.5-325 mg/15 ml solution Take 15 mLs by mouth 4 (four) times daily as needed for moderate pain. 120 mL 0  . levothyroxine (SYNTHROID) 150 MCG tablet Take 150 mcg by mouth every other day. Alternating with 175 mcg dose    . levothyroxine (SYNTHROID) 175 MCG tablet Take 175 mcg by mouth every other day. Alternating with 150 mcg dose    . lidocaine (XYLOCAINE) 2 % solution Patient: Mix 1part 2% viscous lidocaine, 1part H20. Swallow 36mL of diluted mixture, 69min before meals and at bedtime, up to QID prn soreness 100 mL 5  . lidocaine-prilocaine (EMLA) cream Apply to affected area once 30 g 3  . LORazepam (ATIVAN) 0.5 MG tablet Take 1 tablet (0.5 mg total) by mouth every 6 (six) hours as needed (Nausea or vomiting). 30 tablet 0  . Nutritional Supplements (KATE FARMS PEPTIDE  1.5) LIQD Take 4 Bottles by mouth daily. 325 mL 6  . ondansetron (ZOFRAN) 8 MG tablet Take 1 tablet (8 mg total) by mouth 2 (two) times daily as needed. Start on the third day after chemotherapy. 30 tablet 1  . prochlorperazine (COMPAZINE) 10 MG tablet Take 1 tablet (10 mg total) by mouth every 6 (six) hours as needed (Nausea or vomiting). 30 tablet 1  . sodium fluoride (PREVIDENT 5000 PLUS) 1.1 % CREA dental cream Apply cream to tooth brush. Brush teeth for 2 minutes. Spit out excess. DO NOT rinse afterwards. Repeat nightly. (Patient taking differently: Place 1 application onto teeth 2 (two) times a day. ) 1 Tube prn  . dexamethasone (DECADRON) 4 MG tablet Take 2 tablets by mouth once a  day on the day after chemotherapy and then take 2 tablets two times a day for 2 days. Take with food. (Patient not taking: Reported on 04/07/2019) 30 tablet 1  . Morphine Sulfate (MORPHINE CONCENTRATE) 10 mg / 0.5 ml concentrated solution Take 0.5 mLs (10 mg total) by mouth every 6 (six) hours as needed for severe pain. 90 mL 0   No current facility-administered medications for this visit.     PHYSICAL EXAMINATION: ECOG PERFORMANCE STATUS: 1 - Symptomatic but completely ambulatory  Today's Vitals   04/14/19 0827 04/14/19 0830  BP: 101/63   Pulse: 79   Resp: 18   Temp: 98 F (36.7 C)   TempSrc: Oral   SpO2: 100%   Weight: 147 lb 11.2 oz (67 kg)   Height: 5\' 2"  (1.575 m)   PainSc:  0-No pain   Body mass index is 27.01 kg/m.  Filed Weights   04/14/19 0827  Weight: 147 lb 11.2 oz (67 kg)    GENERAL: alert, no distress and comfortable SKIN: skin color, texture, turgor are normal, no rashes or significant lesions EYES: conjunctiva are pink and non-injected, sclera clear OROPHARYNX: no exudate, no erythema; lips, buccal mucosa, and tongue normal  NECK: supple, mild tenderness with palpation bilaterally, tracheostomy in place  LYMPH:  no palpable enlarged lymphadenopathy in the cervical LUNGS: clear to auscultation with normal breathing effort HEART: regular rate & rhythm and no murmurs and no lower extremity edema ABDOMEN: soft, non-tender, non-distended, normal bowel sounds Musculoskeletal: no cyanosis of digits and no clubbing  PSYCH: alert & oriented, quiet speech due to tracheostomy NEURO: no focal motor/sensory deficits  LABORATORY DATA:  I have reviewed the data as listed    Component Value Date/Time   NA 134 (L) 04/14/2019 0752   K 4.1 04/14/2019 0752   CL 100 04/14/2019 0752   CO2 23 04/14/2019 0752   GLUCOSE 90 04/14/2019 0752   BUN 19 04/14/2019 0752   CREATININE 0.85 04/14/2019 0752   CALCIUM 9.1 04/14/2019 0752   PROT 6.9 02/17/2019 1000   ALBUMIN 3.4 (L)  02/17/2019 1000   AST 17 02/17/2019 1000   ALT 25 02/17/2019 1000   ALKPHOS 93 02/17/2019 1000   BILITOT 0.5 02/17/2019 1000   GFRNONAA >60 04/14/2019 0752   GFRAA >60 04/14/2019 0752    No results found for: SPEP, UPEP  Lab Results  Component Value Date   WBC 1.1 (L) 04/14/2019   NEUTROABS 0.4 (LL) 04/14/2019   HGB 8.6 (L) 04/14/2019   HCT 25.6 (L) 04/14/2019   MCV 85.3 04/14/2019   PLT 116 (L) 04/14/2019      Chemistry      Component Value Date/Time   NA 134 (L) 04/14/2019 2992  K 4.1 04/14/2019 0752   CL 100 04/14/2019 0752   CO2 23 04/14/2019 0752   BUN 19 04/14/2019 0752   CREATININE 0.85 04/14/2019 0752      Component Value Date/Time   CALCIUM 9.1 04/14/2019 0752   ALKPHOS 93 02/17/2019 1000   AST 17 02/17/2019 1000   ALT 25 02/17/2019 1000   BILITOT 0.5 02/17/2019 1000       RADIOGRAPHIC STUDIES: I have personally reviewed the radiological images as listed below and agreed with the findings in the report. Dg Carlena Hurl [facility] [employer] Speech Path  Result Date: 03/17/2019 Objective Swallowing Evaluation: Type of Study: MBS-Modified Barium Swallow Study  Patient Details Name: [name] [name] MRN: [id number] Date of Birth: 12-26-1950 Today's Date: 03/17/2019 Time: SLP Start Time (ACUTE ONLY): 1335 -SLP Stop Time (ACUTE ONLY): 1405 SLP Time Calculation (min) (ACUTE ONLY): 30 min Past Medical History: Past Medical History: Diagnosis Date . Cataract  . Laryngeal cancer ([place]) 11/2018 . Thyroid disease  Past Surgical History: Past Surgical History: Procedure Laterality Date . CATARACT EXTRACTION, BILATERAL  left 05/20/16, right 06/03/16  Cataract Extraction w/ IOL Implant; Surgeon: Sudie Grumbling, MD; Location: OR [facility]; Service: Ophthalmology  . CESAREAN SECTION   . ESOPHAGOGASTRODUODENOSCOPY (EGD) WITH PROPOFOL N/A 02/01/2019  Procedure: ESOPHAGOGASTRODUODENOSCOPY (EGD) WITH PROPOFOL;  Surgeon: Milus Banister, MD;  Location: [facility];  Service: Endoscopy;  Laterality:  N/A; . EUS N/A 02/01/2019  Procedure: UPPER ENDOSCOPIC ULTRASOUND (EUS) RADIAL-Will need Linear and Forward Viewing EUS Scope;  Surgeon: Milus Banister, MD;  Location: [facility];  Service: Endoscopy;  Laterality: N/A; . FINE NEEDLE ASPIRATION N/A 02/01/2019  Procedure: FINE NEEDLE ASPIRATION (FNA) LINEAR;  Surgeon: Milus Banister, MD;  Location: [facility];  Service: Endoscopy;  Laterality: N/A; . IR GASTROSTOMY TUBE MOD SED  02/09/2019 . IR IMAGING GUIDED PORT INSERTION  02/02/2019 . KNEE CARTILAGE SURGERY Right 2019 . TRACHEOSTOMY  12/02/2018  [facility] HPI: Pt is a 68 yo female referred for MBS due to h/o supraglottic cancer - diagnosed March 2020 after pt found to have respiratory distress.  She required emergent trach - and CT imaging showed supraglottic laryngeal carcinoma extending across anterior commissure and invading the prelaryngeal fat, left aryepiglottic fold and left aspect of epiglottis with severe nararowing of laryngeal airway and level 2A bilateral subcentimeter cervical lymph nodes.  Pt required PEG tube at that time and did not tolerate a PMSV due to back pressure and increased dyspnea when valve placed in March 2020.  Currently undergoing chemoradiation - radiation 5 x's week and chemo on Thursdays.  Pt reports 2 more weeks of radiation.  She has been consuming watered down pureed foods and liquids including Boost, etc.  She denies pneumonias nor requiring heimlich manuever. MBS indicated.   Subjective: pt awake in flouro chair Assessment / Plan / Recommendation CHL IP CLINICAL IMPRESSIONS 03/17/2019 Clinical Impression Patient presents with moderate pharyngeal dysphagia due to significant edema from XRT impacting pharyngeal motility.  Due tongue base retraction results in compromised epiglottic deflection thus resulting in epiglottis residuals of liquids more than solids (mixed with secretions) that spill into airway after swallow.  Pharyngeal swallow is stronger with solids -  puree/moist cracker without significant oropharyngeal residuals.  Increased pharyngeal contraction with increased viscocity.  Pt did have trace silent aspiration of thin barium that mixed with secretions and became apparent over the study.  She does NOT sense minimal trace aspiration and did not aspirate when challenged with sequential boluses of thin.  Head  turn left, slight chin down did not improve airway protection.  Super supraglottic swallow attempted *with and without PMSV in place but was difficult for patient to coordinate.  Patient occluded trach with her finger when supraglottic swallow trialed without PMSV.    Although patient silently aspirated, it was only trace and included secretions. (Pt tolerated use of PMSV during MBS for 2 swallows ONLY due to report of increased dyspnea.   Encouraged her to continue to practice this super supraglottic swallow if needed in the future.  Educated her to continue po diet as tolerated.  Reviewed aspiration precautions, importance of oral care reviewed as well as recommendation to use caution with intake if she develops fevers or is overtly coughing with intake.  Further recommend patient continue neck massage prior to conducting exercises to maximize swallow exercise effectiveness, etc.  Hopeful for some swallowing improvement several weeks after completion of radiation therapy with decreased edema.  Educated pt to importance of continuing po intake and her swallowing exercises to mitigate fibrosis and disuse muscle atrophy. Thanks for this consult.       SLP Visit Diagnosis Dysphagia, pharyngeal phase (R13.13) Attention and concentration deficit following -- Frontal lobe and executive function deficit following -- Impact on safety and function Mild aspiration risk   No flowsheet data found.  No flowsheet data found. CHL IP DIET RECOMMENDATION 03/17/2019 SLP Diet Recommendations (No Data) Liquid Administration via Cup;Straw Medication Administration Via alternative  means Compensations Slow rate;Small sips/bites;Other (Comment) Postural Changes Remain semi-upright after after feeds/meals (Comment);Seated upright at 90 degrees   No flowsheet data found.  No flowsheet data found.  No flowsheet data found.     CHL IP ORAL PHASE 03/17/2019 Oral Phase WFL Oral - Pudding Teaspoon -- Oral - Pudding Cup -- Oral - Honey Teaspoon -- Oral - Honey Cup -- Oral - Nectar Teaspoon -- Oral - Nectar Cup -- Oral - Nectar Straw WFL Oral - Thin Teaspoon -- Oral - Thin Cup WFL Oral - Thin Straw WFL Oral - Puree WFL Oral - Mech Soft WFL Oral - Regular -- Oral - Multi-Consistency -- Oral - Pill -- Oral Phase - Comment --  CHL IP PHARYNGEAL PHASE 03/17/2019 Pharyngeal Phase Impaired Pharyngeal- Pudding Teaspoon -- Pharyngeal -- Pharyngeal- Pudding Cup -- Pharyngeal -- Pharyngeal- Honey Teaspoon -- Pharyngeal -- Pharyngeal- Honey Cup -- Pharyngeal -- Pharyngeal- Nectar Teaspoon -- Pharyngeal -- Pharyngeal- Nectar Cup -- Pharyngeal -- Pharyngeal- Nectar Straw WFL;Reduced airway/laryngeal closure Pharyngeal -- Pharyngeal- Thin Teaspoon -- Pharyngeal -- Pharyngeal- Thin Cup Reduced epiglottic inversion;Reduced tongue base retraction;Penetration/Aspiration during swallow;Penetration/Apiration after swallow;Trace aspiration Pharyngeal Material enters airway, passes BELOW cords without attempt by patient to eject out (silent aspiration) Pharyngeal- Thin Straw Reduced epiglottic inversion;Penetration/Aspiration during swallow;Penetration/Apiration after swallow;Trace aspiration Pharyngeal Material enters airway, passes BELOW cords without attempt by patient to eject out (silent aspiration) Pharyngeal- Puree WFL Pharyngeal -- Pharyngeal- Mechanical Soft WFL Pharyngeal -- Pharyngeal- Regular -- Pharyngeal -- Pharyngeal- Multi-consistency -- Pharyngeal -- Pharyngeal- Pill -- Pharyngeal -- Pharyngeal Comment secretions retained mix with barium and are aspirated with trace amount of barium, chin tuck *minimal chin  lowering* did not improve airway protection and dumped pyriform sinus residuals into open airway post-swallow, edematous tissue decreased pharyngeal motility, pharyngeal tongue base retraction impaired thus resulting in decreased epiglottic deflection resulting in mild resdiuals that spill into open airway after swallow, super supraglottic swallow performed with and without PMSV was difficult for pt to coordinate correctly and thus did not improve swallow safety- advised she continue to  trials this strategy to use if helpful in the future, swallow  CHL IP CERVICAL ESOPHAGEAL PHASE 03/17/2019 Cervical Esophageal Phase Impaired Pudding Teaspoon -- Pudding Cup -- Honey Teaspoon -- Honey Cup -- Nectar Teaspoon -- Nectar Cup -- Nectar Straw -- Thin Teaspoon -- Thin Cup -- Thin Straw -- Puree -- Mechanical Soft -- Regular -- Multi-consistency -- Pill -- Cervical Esophageal Comment Minimal amount of distal pharyngeal residuals, did not perform esophageal sweep Macario Golds 03/17/2019, 4:41 PM  Luanna Salk, MS [facility] SLP Acute Rehab Services Pager [phone] Office [phone]     CLINICAL DATA:  68 year old female undergoing treatment for supraglottic laryngeal cancer including radiation. Tracheostomy. Dysphagia. Cough. EXAM: MODIFIED BARIUM SWALLOW TECHNIQUE: Different consistencies of barium were administered orally to the patient by the Speech Pathologist. Imaging of the pharynx was performed in the lateral projection. The radiologist was present in the fluoroscopy room for this study, providing personal supervision. FLUOROSCOPY TIME:  Fluoroscopy Time:  2 minutes 6 seconds Radiation Exposure Index (if provided by the fluoroscopic device): 7.2 mGy Number of Acquired Spot Images: 0 COMPARISON:  Neck CT 12/02/2018. FINDINGS: Tracheostomy in place, and evidence of abundant hypopharynx and larynx edema. Flash penetration with trace aspiration, which slowly became apparent over the course of the study due to faint  contrast staining of the ventral trachea, occurred with thin liquids. Only mild retention noted otherwise. IMPRESSION: Trace aspiration which became slowly apparent over the course of the study. Please refer to the Speech Pathologists report for complete details and recommendations. Electronically Signed   By: Genevie Ann M.D.   On: 03/17/2019 14:21

## 2019-04-14 NOTE — Telephone Encounter (Signed)
Received call report from [facility].  "Today's ANC = 0.4."  Currently to provider office with verbal results.  Provider F/U scheduled 8:30 am today.

## 2019-04-20 DIAGNOSIS — J387 Other diseases of larynx: Secondary | ICD-10-CM | POA: Diagnosis not present

## 2019-04-20 DIAGNOSIS — Z430 Encounter for attention to tracheostomy: Secondary | ICD-10-CM | POA: Diagnosis not present

## 2019-04-20 DIAGNOSIS — J383 Other diseases of vocal cords: Secondary | ICD-10-CM | POA: Diagnosis not present

## 2019-04-20 NOTE — Progress Notes (Addendum)
[place] OFFICE PROGRESS NOTE  Patient Care Team: [name] Pali, NP as PCP - General (Nurse Practitioner) [name] Gibson, MD as Attending Physician (Radiation Oncology) [name] Men, MD as Consulting Physician (Hematology) [name] Sauers, RN as Oncology Nurse Navigator [name] [name], RD as Dietitian (Nutrition) [name] [name], CCC-SLP as Speech Language Pathologist (Speech Pathology)  HEME/ONC OVERVIEW: 1.  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 -11/2018:   3.1 x 2.8cm left supraglottic laryngeal mass extending across the anterior commissure and invading the prelaryngeal fat and suspicious <1cm bilateral Level II cervical LN's on CT  Transferred to [facility] for airway compromise and taken to OR for tracheostomy; multiple bx'es non-diagnostic -12/2018: repeat DL with bx of the supraglottic mass showed well-differentiated invasive squamous cell carcinoma  -01/2019: PET showed locally advanced disease, including bilateral cervical LN involvement; mildly FDG-avid mediastinal LN's, bx negative for malignancy -Mid-01/2019 - 03/2019: definitive chemoradiation with weekly cisplatin x 7 doses  2. 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 Constance [name] in mid-04/2019 -PET in 3-4 months (ie around late 10/20202 to early 07/2019) to assess disease response  -See the management of chemotherapy-related toxicities below -PRN anti-emetics: Zofran, Compazine, Ativan and dexamethasone   Chemotherapy-associated anemia -Secondary to chemotherapy -Hgb 8.4  today, stable  -Patient denies any symptom of bleeding -I have ordered iron profile for the next visit -We will monitor for now  Hyponatremia  -Na 130 today, lower than the last visit  -Continue periodic supplementation with Pedialyte -I have added IV fluid support with 1L for the reminder of the week  -I have also reached out to nutrition regarding dietary recommendations on Na intake -We will monitor it closely   Chemotherapy-associated mucositis -Secondary to chemotherapy -Grade 2, stable  -Current regimen: IR liquid morphine 10mg /0.14mL q4hrs PRN, viscous lidocaine swish and swallow PRN and salt/soda rinses  -Pain relatively well controlled -Continue the pain regimen above  Protein malnutrition -Secondary to chemoradiation -Weight stable since last visit -Currently taking 3.5 cans of [employer] via the feeding tube as well as several bottles of water via feeding tube and by mouth -I encouraged patient to adhere to nutritional recommendations, and as her mucositis continues to improve, she may also resume oral intake as tolerated   Borderline hypotension -BP 93/51 today, patient is asymptomatic -Not currently on any anti-hypertensive -I encouraged the patient to maintain adequate hydration  Severe hypothyroidism -TSH 69 with free T4 0.33, c/w severe hypothyrodism, likely exacerbated by recent chemoradiation  -Patient is currently on Synthroid 169mcg alternating with 148mcg -Given her long-standing hypothyroidism, I strongly recommended the patient to follow up with her endocrinologist ASAP for further management   Orders Placed This Encounter  Procedures  . CBC with Differential (Cancer Center Only)    Standing Status:   Future    Standing Expiration Date:   05/25/2020  . CMP ([place] only)    Standing Status:   Future    Standing Expiration Date:   05/25/2020  . TSH    Standing Status:   Future    Standing Expiration Date:  04/20/2020  . T4, free    Standing Status:    Future    Standing Expiration Date:   04/20/2020  . Ferritin    Standing Status:   Future    Standing Expiration Date:   05/25/2020  . Iron and TIBC    Standing Status:   Future    Standing Expiration Date:   05/25/2020   All questions were answered. The patient knows to call the clinic with any problems, questions or concerns. No barriers to learning was detected.  Return in 3 weeks for labs, port flush and clinic appt.   [name] Men, MD 04/21/2019 8:49 AM  CHIEF COMPLAINT: "My throat is still sore"  INTERVAL HISTORY: Ms. [name] [name] returns to clinic for follow-up of squamous cell carcinoma of the supraglottic larynx s/p definitive chemoradiation.  Patient reports that she still has soreness in her neck, for which she is currently taking liquid morphine approximately twice a day as well as viscous lidocaine swish/swallow with relatively adequate control of the pain.  She tried to drink 1 boost by mouth yesterday, but still had some pain with swallowing.  She is currently taking approximately 3 and half cans of [facility] via the feeding tube as well as 6 bottles of 8 ounce water per day.  She denies any other complaint today.  SUMMARY OF ONCOLOGIC HISTORY: Oncology History  Malignant neoplasm of supraglottis ([place])  12/02/2018 Imaging   CT neck: IMPRESSION: 1. Supraglottic laryngeal carcinoma extending across the anterior commissure and invading the prelaryngeal fat, left aryepiglottic fold and left aspect of the epiglottis. 2. Severe narrowing of the laryngeal airway. 3. Bilateral subcentimeter level 2A and 2A cervical lymph nodes.  Critical Value/emergent results were called by telephone at the time of interpretation on 12/02/2018 at 5:34 pm to Dr. Collier [name], who verbally acknowledged these results.   12/02/2018 Imaging   CT  Chest:  IMPRESSION: No evidence of metastatic disease in the chest.   12/03/2018 Pathology Results   (Care Everywhere; [facility]) A: Larynx,  supraglottis, biopsy - Atypical squamous proliferation with at least low grade dysplasia and abundant keratinization - No definite high grade dysplasia or invasive carcinoma represented (see comment)   12/18/2018 Pathology Results   (Care Everywhere; [facility]) A:  Neck, left, fine needle aspiration - Rare single atypical cells, no cells diagnostic of malignancy - Lymph node material present  B:  Neck, right, fine needle aspiration - Non-diagnostic specimen - No lymph node material identified - Mixed inflammation and blood   12/31/2018 Pathology Results   (Care Everywhere; [facility])  A: Larynx, left supraglottic mass, biopsy - Atypical squamous proliferation with abundant keratinization, suspicious for well-differentiated invasive squamous cell carcinoma  B: Larynx, left supraglottic mass, biopsy - Well-differentiated invasive squamous cell carcinoma with abundant keratinization (see comment)   01/18/2019 Initial Diagnosis   Laryngeal cancer ([place])   01/26/2019 Imaging   PET: IMPRESSION: 1. Locally advanced hypermetabolic laryngeal neoplasm centered in the left supraglottic space with bilateral glottic and epiglottic involvement and subglottic extension on the left. 2. Hypermetabolic bilateral level 3 neck nodal metastases. 3. Hypermetabolic high mediastinal nodal metastases between the trachea and upper thoracic esophagus. 4. Otherwise no distant hypermetabolic metastatic disease. 5. Nodular 7 mm focus at the left lung base, below PET resolution, recommend attention on follow-up chest CT in 3 months. 6. Chronic findings include: Aortic Atherosclerosis (ICD10-I70.0). Cholelithiasis.   02/08/2019 Cancer Staging   Staging form: Larynx - Supraglottis, AJCC 8th Edition - Clinical stage from 02/08/2019:  Stage IVA (cT3, cN2c, cM0) - Signed by [name] Gibson, MD on 02/08/2019   02/11/2019 -  Chemotherapy   The patient had palonosetron (ALOXI) injection 0.25 mg, 0.25 mg, Intravenous,   Once, 7 of 7 cycles Administration: 0.25 mg (02/11/2019), 0.25 mg (02/18/2019), 0.25 mg (03/04/2019), 0.25 mg (03/11/2019), 0.25 mg (03/18/2019), 0.25 mg (03/25/2019), 0.25 mg (04/01/2019) CISplatin (PLATINOL) 72 mg in sodium chloride 0.9 % 250 mL chemo infusion, 40 mg/m2 = 72 mg, Intravenous,  Once, 7 of 7 cycles Administration: 72 mg (02/11/2019), 72 mg (02/18/2019), 72 mg (03/04/2019), 72 mg (03/11/2019), 72 mg (03/18/2019), 72 mg (03/25/2019), 72 mg (04/01/2019) fosaprepitant (EMEND) 150 mg, dexamethasone (DECADRON) 12 mg in sodium chloride 0.9 % 145 mL IVPB, , Intravenous,  Once, 7 of 7 cycles Administration:  (02/11/2019),  (02/18/2019),  (03/04/2019),  (03/11/2019),  (03/18/2019),  (03/25/2019),  (04/01/2019)  for chemotherapy treatment.      REVIEW OF SYSTEMS:   Constitutional: ( - ) fevers, ( - )  chills , ( - ) night sweats Eyes: ( - ) blurriness of vision, ( - ) double vision, ( - ) watery eyes Ears, nose, mouth, throat, and face: ( + ) mucositis, ( + ) sore throat Respiratory: ( - ) cough, ( - ) dyspnea, ( - ) wheezes Cardiovascular: ( - ) palpitation, ( - ) chest discomfort, ( - ) lower extremity swelling Gastrointestinal:  ( - ) nausea, ( - ) heartburn, ( - ) change in bowel habits Skin: ( - ) abnormal skin rashes Lymphatics: ( - ) new lymphadenopathy, ( - ) easy bruising Neurological: ( - ) numbness, ( - ) tingling, ( - ) new weaknesses Behavioral/Psych: ( - ) mood change, ( - ) new changes  All other systems were reviewed with the patient and are negative.  I have reviewed the past medical history, past surgical history, social history and family history with the patient and they are unchanged from previous note.  ALLERGIES:  has No Known Allergies.  MEDICATIONS:  Current Outpatient Medications  Medication Sig Dispense Refill  . chlorhexidine (PERIDEX) 0.12 % solution Rinse with 15 mls twice daily for 30 seconds. Use after breakfast and at bedtime. Spit out excess. Do not swallow. (Patient taking  differently: Use as directed 15 mLs in the mouth or throat 2 (two) times daily. Rinse with 15 mls twice daily for 30 seconds. Use after breakfast and at bedtime. Spit out excess. Do not swallow.) 480 mL prn  . docusate sodium (COLACE) 100 MG capsule Take 100 mg by mouth 2 (two) times daily.    Marland Kitchen levothyroxine (SYNTHROID) 150 MCG tablet Take 150 mcg by mouth every other day. Alternating with 175 mcg dose    . levothyroxine (SYNTHROID) 175 MCG tablet Take 175 mcg by mouth every other day. Alternating with 150 mcg dose    . lidocaine (XYLOCAINE) 2 % solution Patient: Mix 1part 2% viscous lidocaine, 1part H20. Swallow 67mL of diluted mixture, 55min before meals and at bedtime, up to QID prn soreness 100 mL 5  . lidocaine-prilocaine (EMLA) cream Apply to affected area once 30 g 3  . LORazepam (ATIVAN) 0.5 MG tablet Take 1 tablet (0.5 mg total) by mouth every 6 (six) hours as needed (Nausea or vomiting). 30 tablet 0  . Morphine Sulfate (MORPHINE CONCENTRATE) 10 mg / 0.5 ml concentrated solution Take 0.5 mLs (10 mg total) by mouth every 6 (six) hours as needed for severe pain. 90 mL 0  . Nutritional Supplements (KATE FARMS  PEPTIDE 1.5) LIQD Take 4 Bottles by mouth daily. 325 mL 6  . ondansetron (ZOFRAN) 8 MG tablet Take 1 tablet (8 mg total) by mouth 2 (two) times daily as needed. Start on the third day after chemotherapy. 30 tablet 1  . prochlorperazine (COMPAZINE) 10 MG tablet Take 1 tablet (10 mg total) by mouth every 6 (six) hours as needed (Nausea or vomiting). 30 tablet 1  . sodium fluoride (PREVIDENT 5000 PLUS) 1.1 % CREA dental cream Apply cream to tooth brush. Brush teeth for 2 minutes. Spit out excess. DO NOT rinse afterwards. Repeat nightly. (Patient taking differently: Place 1 application onto teeth 2 (two) times a day. ) 1 Tube prn  . dexamethasone (DECADRON) 4 MG tablet Take 2 tablets by mouth once a day on the day after chemotherapy and then take 2 tablets two times a day for 2 days. Take with  food. (Patient not taking: Reported on 04/07/2019) 30 tablet 1   No current facility-administered medications for this visit.     PHYSICAL EXAMINATION: ECOG PERFORMANCE STATUS: 1 - Symptomatic but completely ambulatory  Today's Vitals   04/21/19 0822 04/21/19 0825  BP: (!) 93/51   Pulse: 74   Resp: 16   Temp: 97.8 F (36.6 C)   TempSrc: Oral   SpO2: 98%   Weight: 149 lb 4.8 oz (67.7 kg)   Height: 5\' 2"  (1.575 m)   PainSc:  3    Body mass index is 27.31 kg/m.  Filed Weights   04/21/19 0822  Weight: 149 lb 4.8 oz (67.7 kg)    GENERAL: alert, no distress and comfortable SKIN: skin color, texture, turgor are normal, no rashes or significant lesions EYES: conjunctiva are pink and non-injected, sclera clear OROPHARYNX: no exudate, no erythema; lips, buccal mucosa, and tongue normal; thick saliva noted  NECK: supple, non-tender, tracheostomy in place  LYMPH:  bilateral neck fullness but no palpable lymphadenopathy in the cervical LUNGS: clear to auscultation with normal breathing effort HEART: regular rate & rhythm and no murmurs and no lower extremity edema ABDOMEN: soft, non-tender, non-distended, normal bowel sounds Musculoskeletal: no cyanosis of digits and no clubbing  NEURO: no focal motor/sensory deficits  LABORATORY DATA:  I have reviewed the data as listed    Component Value Date/Time   NA 134 (L) 04/14/2019 0752   K 4.1 04/14/2019 0752   CL 100 04/14/2019 0752   CO2 23 04/14/2019 0752   GLUCOSE 90 04/14/2019 0752   BUN 19 04/14/2019 0752   CREATININE 0.85 04/14/2019 0752   CALCIUM 9.1 04/14/2019 0752   PROT 6.9 02/17/2019 1000   ALBUMIN 3.4 (L) 02/17/2019 1000   AST 17 02/17/2019 1000   ALT 25 02/17/2019 1000   ALKPHOS 93 02/17/2019 1000   BILITOT 0.5 02/17/2019 1000   GFRNONAA >60 04/14/2019 0752   GFRAA >60 04/14/2019 0752    No results found for: SPEP, UPEP  Lab Results  Component Value Date   WBC 4.7 04/21/2019   NEUTROABS 3.0 04/21/2019   HGB  8.4 (L) 04/21/2019   HCT 25.1 (L) 04/21/2019   MCV 85.4 04/21/2019   PLT 309 04/21/2019      Chemistry      Component Value Date/Time   NA 134 (L) 04/14/2019 0752   K 4.1 04/14/2019 0752   CL 100 04/14/2019 0752   CO2 23 04/14/2019 0752   BUN 19 04/14/2019 0752   CREATININE 0.85 04/14/2019 0752      Component Value Date/Time   CALCIUM 9.1  04/14/2019 0752   ALKPHOS 93 02/17/2019 1000   AST 17 02/17/2019 1000   ALT 25 02/17/2019 1000   BILITOT 0.5 02/17/2019 1000

## 2019-04-21 ENCOUNTER — Telehealth: Payer: Self-pay | Admitting: Hematology

## 2019-04-21 ENCOUNTER — Other Ambulatory Visit: Payer: Self-pay

## 2019-04-21 ENCOUNTER — Inpatient Hospital Stay (HOSPITAL_BASED_OUTPATIENT_CLINIC_OR_DEPARTMENT_OTHER): Payer: Medicare HMO | Admitting: Hematology

## 2019-04-21 ENCOUNTER — Telehealth: Payer: Self-pay | Admitting: *Deleted

## 2019-04-21 ENCOUNTER — Inpatient Hospital Stay: Payer: Medicare HMO

## 2019-04-21 ENCOUNTER — Encounter: Payer: Self-pay | Admitting: Hematology

## 2019-04-21 VITALS — BP 93/51 | HR 74 | Temp 97.8°F | Resp 16 | Ht 62.0 in | Wt 149.3 lb

## 2019-04-21 DIAGNOSIS — K1231 Oral mucositis (ulcerative) due to antineoplastic therapy: Secondary | ICD-10-CM

## 2019-04-21 DIAGNOSIS — M542 Cervicalgia: Secondary | ICD-10-CM | POA: Diagnosis not present

## 2019-04-21 DIAGNOSIS — Z5111 Encounter for antineoplastic chemotherapy: Secondary | ICD-10-CM | POA: Diagnosis not present

## 2019-04-21 DIAGNOSIS — I959 Hypotension, unspecified: Secondary | ICD-10-CM | POA: Diagnosis not present

## 2019-04-21 DIAGNOSIS — K1230 Oral mucositis (ulcerative), unspecified: Secondary | ICD-10-CM

## 2019-04-21 DIAGNOSIS — I700 Atherosclerosis of aorta: Secondary | ICD-10-CM

## 2019-04-21 DIAGNOSIS — Z79899 Other long term (current) drug therapy: Secondary | ICD-10-CM

## 2019-04-21 DIAGNOSIS — D6481 Anemia due to antineoplastic chemotherapy: Secondary | ICD-10-CM

## 2019-04-21 DIAGNOSIS — D6959 Other secondary thrombocytopenia: Secondary | ICD-10-CM | POA: Diagnosis not present

## 2019-04-21 DIAGNOSIS — C321 Malignant neoplasm of supraglottis: Secondary | ICD-10-CM

## 2019-04-21 DIAGNOSIS — E039 Hypothyroidism, unspecified: Secondary | ICD-10-CM | POA: Diagnosis not present

## 2019-04-21 DIAGNOSIS — E46 Unspecified protein-calorie malnutrition: Secondary | ICD-10-CM | POA: Diagnosis not present

## 2019-04-21 DIAGNOSIS — R070 Pain in throat: Secondary | ICD-10-CM

## 2019-04-21 DIAGNOSIS — D701 Agranulocytosis secondary to cancer chemotherapy: Secondary | ICD-10-CM | POA: Diagnosis not present

## 2019-04-21 DIAGNOSIS — T451X5A Adverse effect of antineoplastic and immunosuppressive drugs, initial encounter: Secondary | ICD-10-CM | POA: Diagnosis not present

## 2019-04-21 DIAGNOSIS — Z95828 Presence of other vascular implants and grafts: Secondary | ICD-10-CM

## 2019-04-21 DIAGNOSIS — E871 Hypo-osmolality and hyponatremia: Secondary | ICD-10-CM

## 2019-04-21 DIAGNOSIS — Z79890 Hormone replacement therapy (postmenopausal): Secondary | ICD-10-CM

## 2019-04-21 LAB — CMP (CANCER CENTER ONLY)
ALT: 8 U/L (ref 0–44)
AST: 12 U/L — ABNORMAL LOW (ref 15–41)
Albumin: 2.8 g/dL — ABNORMAL LOW (ref 3.5–5.0)
Alkaline Phosphatase: 83 U/L (ref 38–126)
Anion gap: 9 (ref 5–15)
BUN: 23 mg/dL (ref 8–23)
CO2: 24 mmol/L (ref 22–32)
Calcium: 9.1 mg/dL (ref 8.9–10.3)
Chloride: 97 mmol/L — ABNORMAL LOW (ref 98–111)
Creatinine: 0.84 mg/dL (ref 0.44–1.00)
GFR, Est AFR Am: >60 mL/min (ref >60)
GFR, Estimated: >60 mL/min (ref >60)
Glucose, Bld: 101 mg/dL — ABNORMAL HIGH (ref 70–99)
Potassium: 4.5 mmol/L (ref 3.5–5.1)
Sodium: 130 mmol/L — ABNORMAL LOW (ref 135–145)
Total Bilirubin: 0.2 mg/dL — ABNORMAL LOW (ref 0.3–1.2)
Total Protein: 6.4 g/dL — ABNORMAL LOW (ref 6.5–8.1)

## 2019-04-21 LAB — CBC WITH DIFFERENTIAL (CANCER CENTER ONLY)
Abs Immature Granulocytes: 0.13 10*3/uL — ABNORMAL HIGH (ref 0.00–0.07)
Basophils Absolute: 0 10*3/uL (ref 0.0–0.1)
Basophils Relative: 0 %
Eosinophils Absolute: 0 10*3/uL (ref 0.0–0.5)
Eosinophils Relative: 0 %
HCT: 25.1 % — ABNORMAL LOW (ref 36.0–46.0)
Hemoglobin: 8.4 g/dL — ABNORMAL LOW (ref 12.0–15.0)
Immature Granulocytes: 3 %
Lymphocytes Relative: 16 %
Lymphs Abs: 0.7 10*3/uL (ref 0.7–4.0)
MCH: 28.6 pg (ref 26.0–34.0)
MCHC: 33.5 g/dL (ref 30.0–36.0)
MCV: 85.4 fL (ref 80.0–100.0)
Monocytes Absolute: 0.8 10*3/uL (ref 0.1–1.0)
Monocytes Relative: 18 %
Neutro Abs: 3 10*3/uL (ref 1.7–7.7)
Neutrophils Relative %: 63 %
Platelet Count: 309 10*3/uL (ref 150–400)
RBC: 2.94 MIL/uL — ABNORMAL LOW (ref 3.87–5.11)
RDW: 20.9 % — ABNORMAL HIGH (ref 11.5–15.5)
WBC Count: 4.7 10*3/uL (ref 4.0–10.5)
nRBC: 0 % (ref 0.0–0.2)

## 2019-04-21 LAB — T4, FREE: Free T4: 0.33 ng/dL — ABNORMAL LOW (ref 0.61–1.12)

## 2019-04-21 LAB — MAGNESIUM: Magnesium: 2.1 mg/dL (ref 1.7–2.4)

## 2019-04-21 LAB — TSH: TSH: 69.522 u[IU]/mL — ABNORMAL HIGH (ref 0.308–3.960)

## 2019-04-21 MED ORDER — HEPARIN SOD (PORK) LOCK FLUSH 100 UNIT/ML IV SOLN
500.0000 [IU] | Freq: Once | INTRAVENOUS | Status: AC | PRN
  Administered 2019-04-21: 500 [IU]
  Filled 2019-04-21: qty 5

## 2019-04-21 MED ORDER — SODIUM CHLORIDE 0.9% FLUSH
10.0000 mL | INTRAVENOUS | Status: DC | PRN
  Administered 2019-04-21: 08:00:00 10 mL
  Filled 2019-04-21: qty 10

## 2019-04-21 NOTE — Telephone Encounter (Signed)
Scheduled appt per 7/29 los.  Printed calendar and avs.

## 2019-04-21 NOTE — Addendum Note (Signed)
Addended by: Tish Men on: 04/21/2019 11:11 AM   Modules accepted: Level of Service

## 2019-04-21 NOTE — Telephone Encounter (Signed)
Spoke with roommate [name] [name], and informed her of pt's low sodium level - lower from the last visit.  Pt has trach; she could hear the conversation by speaker phone.  Instructed pt to substitute water for Pedialyte as per Dr. Lorette Ang instructions. Gave pt appts for IVF for next 3 days.  [name] [name] voiced understanding. Asked if pt has been taking her thyroid medications.  Pt responded " NO for the last 3 days due to unable to swallow caused by radiation ".  [name] [name] stated she would help by giving meds via PEG tube. Instructed pt to ask for copy of labs tomorrow, and to contact her PCP @ [facility] for further instructions.  [name] [name] voiced understanding.

## 2019-04-22 ENCOUNTER — Other Ambulatory Visit: Payer: Self-pay

## 2019-04-22 ENCOUNTER — Inpatient Hospital Stay: Payer: Medicare HMO

## 2019-04-22 VITALS — BP 103/61 | HR 74 | Temp 98.7°F | Resp 20

## 2019-04-22 DIAGNOSIS — E039 Hypothyroidism, unspecified: Secondary | ICD-10-CM | POA: Diagnosis not present

## 2019-04-22 DIAGNOSIS — Z95828 Presence of other vascular implants and grafts: Secondary | ICD-10-CM

## 2019-04-22 DIAGNOSIS — C321 Malignant neoplasm of supraglottis: Secondary | ICD-10-CM | POA: Diagnosis not present

## 2019-04-22 DIAGNOSIS — K1231 Oral mucositis (ulcerative) due to antineoplastic therapy: Secondary | ICD-10-CM | POA: Diagnosis not present

## 2019-04-22 DIAGNOSIS — D6959 Other secondary thrombocytopenia: Secondary | ICD-10-CM | POA: Diagnosis not present

## 2019-04-22 DIAGNOSIS — D6481 Anemia due to antineoplastic chemotherapy: Secondary | ICD-10-CM | POA: Diagnosis not present

## 2019-04-22 DIAGNOSIS — D701 Agranulocytosis secondary to cancer chemotherapy: Secondary | ICD-10-CM | POA: Diagnosis not present

## 2019-04-22 DIAGNOSIS — I959 Hypotension, unspecified: Secondary | ICD-10-CM | POA: Diagnosis not present

## 2019-04-22 DIAGNOSIS — Z5111 Encounter for antineoplastic chemotherapy: Secondary | ICD-10-CM | POA: Diagnosis not present

## 2019-04-22 DIAGNOSIS — M542 Cervicalgia: Secondary | ICD-10-CM | POA: Diagnosis not present

## 2019-04-22 DIAGNOSIS — T451X5A Adverse effect of antineoplastic and immunosuppressive drugs, initial encounter: Secondary | ICD-10-CM | POA: Diagnosis not present

## 2019-04-22 MED ORDER — HEPARIN SOD (PORK) LOCK FLUSH 100 UNIT/ML IV SOLN
500.0000 [IU] | Freq: Once | INTRAVENOUS | Status: AC | PRN
  Administered 2019-04-22: 500 [IU]
  Filled 2019-04-22: qty 5

## 2019-04-22 MED ORDER — SODIUM CHLORIDE 0.9 % IV SOLN
Freq: Once | INTRAVENOUS | Status: AC
  Administered 2019-04-22: 08:00:00 via INTRAVENOUS
  Filled 2019-04-22: qty 250

## 2019-04-22 MED ORDER — SODIUM CHLORIDE 0.9% FLUSH
10.0000 mL | Freq: Once | INTRAVENOUS | Status: AC | PRN
  Administered 2019-04-22: 10 mL
  Filled 2019-04-22: qty 10

## 2019-04-22 NOTE — Progress Notes (Addendum)
I called the patient today about their upcoming follow-up appointment in radiation oncology.   Given concerns about the COVID-19 pandemic, I offered a phone assessment with the patient to determine if coming to the clinic was necessary. The patient accepted.  I let the patient know that I had spoken with Dr. Isidore Moos, and she wanted them to know the importance of washing their hands for at least 20 seconds at a time, especially after going out in public, and before they eat. Limit going out in public whenever possible. Do not touch your face, unless your hands are clean, such as when bathing. Get plenty of rest, eat well, and stay hydrated.   Symptomatically, the patient is doing relatively well. They report receiving IVF the rest of this week and seeing Dr. Maylon Peppers on 05/12/19. She reports her skin has healed well. She will call me if she develops any concerns related to radiation.   All questions were answered to the patient's satisfaction.  I encouraged the patient to call with any further questions. Otherwise, the plan is follow up with Dr. Isidore Moos in October after a PET scan has been completed for results.    Patient is pleased with this plan, and we will cancel their upcoming follow-up to reduce the risk of COVID-19 transmission.

## 2019-04-22 NOTE — Patient Instructions (Signed)
Coronavirus (COVID-19) Are you at risk?  Are you at risk for the Coronavirus (COVID-19)?  To be considered HIGH RISK for Coronavirus (COVID-19), you have to meet the following criteria:  . Traveled to [place], [place], [place], [place] or [place]; or in the [place] to [place], [place], [place], or [place]; and have fever, cough, and shortness of breath within the last 2 weeks of travel OR . Been in close contact with a person diagnosed with COVID-19 within the last 2 weeks and have fever, cough, and shortness of breath . IF YOU DO NOT MEET THESE CRITERIA, YOU ARE CONSIDERED LOW RISK FOR COVID-19.  What to do if you are HIGH RISK for COVID-19?  . If you are having a medical emergency, call 911. . Seek medical care right away. Before you go to a doctor's office, urgent care or emergency department, call ahead and tell them about your recent travel, contact with someone diagnosed with COVID-19, and your symptoms. You should receive instructions from your physician's office regarding next steps of care.  . When you arrive at healthcare provider, tell the healthcare staff immediately you have returned from visiting [place], [place], [place], [place] or [place]; or traveled in the [place] to [place], [place], [place], or [place]; in the last two weeks or you have been in close contact with a person diagnosed with COVID-19 in the last 2 weeks.   . Tell the health care staff about your symptoms: fever, cough and shortness of breath. . After you have been seen by a medical provider, you will be either: o Tested for (COVID-19) and discharged home on quarantine except to seek medical care if symptoms worsen, and asked to  - Stay home and avoid contact with others until you get your results (4-5 days)  - Avoid travel on public transportation if possible (such as bus, train, or airplane) or o Sent to the Emergency Department by EMS for evaluation, COVID-19 testing, and possible  admission depending on your condition and test results.  What to do if you are LOW RISK for COVID-19?  Reduce your risk of any infection by using the same precautions used for avoiding the common cold or flu:  . Wash your hands often with soap and warm water for at least 20 seconds.  If soap and water are not readily available, use an alcohol-based hand sanitizer with at least 60% alcohol.  . If coughing or sneezing, cover your mouth and nose by coughing or sneezing into the elbow areas of your shirt or coat, into a tissue or into your sleeve (not your hands). . Avoid shaking hands with others and consider head nods or verbal greetings only. . Avoid touching your eyes, nose, or mouth with unwashed hands.  . Avoid close contact with people who are sick. . Avoid places or events with large numbers of people in one location, like concerts or sporting events. . Carefully consider travel plans you have or are making. . If you are planning any travel outside or inside the [place], visit the CDC's Travelers' Health webpage for the latest health notices. . If you have some symptoms but not all symptoms, continue to monitor at home and seek medical attention if your symptoms worsen. . If you are having a medical emergency, call 911.   ADDITIONAL HEALTHCARE OPTIONS FOR PATIENTS  [facility] / e-Visit: [email or web address]         [facility]: [phone]  [facility]: [phone]                   [facility]: [phone]    Dehydration, Adult  Dehydration is a condition in which there is not enough fluid or water in the body. This happens when you lose more fluids than you take in. Important organs, such as the kidneys, brain, and heart, cannot function without a proper amount of fluids. Any loss of fluids from the body can lead to dehydration. Dehydration can range from mild to severe. This condition  should be treated right away to prevent it from becoming severe. What are the causes? This condition may be caused by:  Vomiting.  Diarrhea.  Excessive sweating, such as from heat exposure or exercise.  Not drinking enough fluid, especially: ? When ill. ? While doing activity that requires a lot of energy.  Excessive urination.  Fever.  Infection.  Certain medicines, such as medicines that cause the body to lose excess fluid (diuretics).  Inability to access safe drinking water.  Reduced physical ability to get adequate water and food. What increases the risk? This condition is more likely to develop in people:  Who have a poorly controlled long-term (chronic) illness, such as diabetes, heart disease, or kidney disease.  Who are age 65 or older.  Who are disabled.  Who live in a place with high altitude.  Who play endurance sports. What are the signs or symptoms? Symptoms of mild dehydration may include:  Thirst.  Dry lips.  Slightly dry mouth.  Dry, warm skin.  Dizziness. Symptoms of moderate dehydration may include:  Very dry mouth.  Muscle cramps.  Dark urine. Urine may be the color of tea.  Decreased urine production.  Decreased tear production.  Heartbeat that is irregular or faster than normal (palpitations).  Headache.  Light-headedness, especially when you stand up from a sitting position.  Fainting (syncope). Symptoms of severe dehydration may include:  Changes in skin, such as: ? Cold and clammy skin. ? Blotchy (mottled) or pale skin. ? Skin that does not quickly return to normal after being lightly pinched and released (poor skin turgor).  Changes in body fluids, such as: ? Extreme thirst. ? No tear production. ? Inability to sweat when body temperature is high, such as in hot weather. ? Very little urine production.  Changes in vital signs, such as: ? Weak pulse. ? Pulse that is more than 100 beats a minute when sitting  still. ? Rapid breathing. ? Low blood pressure.  Other changes, such as: ? Sunken eyes. ? Cold hands and feet. ? Confusion. ? Lack of energy (lethargy). ? Difficulty waking up from sleep. ? Short-term weight loss. ? Unconsciousness. How is this diagnosed? This condition is diagnosed based on your symptoms and a physical exam. Blood and urine tests may be done to help confirm the diagnosis. How is this treated? Treatment for this condition depends on the severity. Mild or moderate dehydration can often be treated at home. Treatment should be started right away. Do not wait until dehydration becomes severe. Severe dehydration is an emergency and it needs to be treated in a hospital. Treatment for mild dehydration may include:  Drinking more fluids.  Replacing salts and minerals in your blood (electrolytes) that you may have lost. Treatment for moderate dehydration may include:  Drinking an oral rehydration solution (ORS). This is a drink that helps you replace fluids and electrolytes (rehydrate). It can be found at pharmacies and retail   stores. Treatment for severe dehydration may include:  Receiving fluids through an IV tube.  Receiving an electrolyte solution through a feeding tube that is passed through your nose and into your stomach (nasogastric tube, or NG tube).  Correcting any abnormalities in electrolytes.  Treating the underlying cause of dehydration. Follow these instructions at home:  If directed by your health care provider, drink an ORS: ? Make an ORS by following instructions on the package. ? Start by drinking small amounts, about  cup (120 mL) every 5-10 minutes. ? Slowly increase how much you drink until you have taken the amount recommended by your health care provider.  Drink enough clear fluid to keep your urine clear or pale yellow. If you were told to drink an ORS, finish the ORS first, then start slowly drinking other clear fluids. Drink fluids such as:  ? Water. Do not drink only water. Doing that can lead to having too little salt (sodium) in the body (hyponatremia). ? Ice chips. ? Fruit juice that you have added water to (diluted fruit juice). ? Low-calorie sports drinks.  Avoid: ? Alcohol. ? Drinks that contain a lot of sugar. These include high-calorie sports drinks, fruit juice that is not diluted, and soda. ? Caffeine. ? Foods that are greasy or contain a lot of fat or sugar.  Take over-the-counter and prescription medicines only as told by your health care provider.  Do not take sodium tablets. This can lead to having too much sodium in the body (hypernatremia).  Eat foods that contain a healthy balance of electrolytes, such as bananas, oranges, potatoes, tomatoes, and spinach.  Keep all follow-up visits as told by your health care provider. This is important. Contact a health care provider if:  You have abdominal pain that: ? Gets worse. ? Stays in one area (localizes).  You have a rash.  You have a stiff neck.  You are more irritable than usual.  You are sleepier or more difficult to wake up than usual.  You feel weak or dizzy.  You feel very thirsty.  You have urinated only a small amount of very dark urine over 6-8 hours. Get help right away if:  You have symptoms of severe dehydration.  You cannot drink fluids without vomiting.  Your symptoms get worse with treatment.  You have a fever.  You have a severe headache.  You have vomiting or diarrhea that: ? Gets worse. ? Does not go away.  You have blood or green matter (bile) in your vomit.  You have blood in your stool. This may cause stool to look black and tarry.  You have not urinated in 6-8 hours.  You faint.  Your heart rate while sitting still is over 100 beats a minute.  You have trouble breathing. This information is not intended to replace advice given to you by your health care provider. Make sure you discuss any questions you have  with your health care provider. Document Released: 09/09/2005 Document Revised: 08/22/2017 Document Reviewed: 11/03/2015 Elsevier Patient Education  2020 [employer]  

## 2019-04-23 ENCOUNTER — Other Ambulatory Visit: Payer: Self-pay | Admitting: Radiation Oncology

## 2019-04-23 ENCOUNTER — Ambulatory Visit
Admission: RE | Admit: 2019-04-23 | Discharge: 2019-04-23 | Disposition: A | Discharge status: Home / Self Care | Payer: Medicare HMO | Source: Ambulatory Visit | Attending: Radiation Oncology | Admitting: Radiation Oncology

## 2019-04-23 ENCOUNTER — Other Ambulatory Visit: Payer: Self-pay

## 2019-04-23 ENCOUNTER — Inpatient Hospital Stay: Payer: Medicare HMO

## 2019-04-23 VITALS — BP 117/77 | HR 67 | Temp 98.2°F | Resp 18

## 2019-04-23 DIAGNOSIS — R634 Abnormal weight loss: Secondary | ICD-10-CM

## 2019-04-23 DIAGNOSIS — T451X5A Adverse effect of antineoplastic and immunosuppressive drugs, initial encounter: Secondary | ICD-10-CM | POA: Diagnosis not present

## 2019-04-23 DIAGNOSIS — D6959 Other secondary thrombocytopenia: Secondary | ICD-10-CM | POA: Diagnosis not present

## 2019-04-23 DIAGNOSIS — I959 Hypotension, unspecified: Secondary | ICD-10-CM | POA: Diagnosis not present

## 2019-04-23 DIAGNOSIS — Z95828 Presence of other vascular implants and grafts: Secondary | ICD-10-CM

## 2019-04-23 DIAGNOSIS — D6481 Anemia due to antineoplastic chemotherapy: Secondary | ICD-10-CM | POA: Diagnosis not present

## 2019-04-23 DIAGNOSIS — C321 Malignant neoplasm of supraglottis: Secondary | ICD-10-CM

## 2019-04-23 DIAGNOSIS — Z5111 Encounter for antineoplastic chemotherapy: Secondary | ICD-10-CM | POA: Diagnosis not present

## 2019-04-23 DIAGNOSIS — M542 Cervicalgia: Secondary | ICD-10-CM | POA: Diagnosis not present

## 2019-04-23 DIAGNOSIS — K1231 Oral mucositis (ulcerative) due to antineoplastic therapy: Secondary | ICD-10-CM | POA: Diagnosis not present

## 2019-04-23 DIAGNOSIS — E039 Hypothyroidism, unspecified: Secondary | ICD-10-CM | POA: Diagnosis not present

## 2019-04-23 DIAGNOSIS — D701 Agranulocytosis secondary to cancer chemotherapy: Secondary | ICD-10-CM | POA: Diagnosis not present

## 2019-04-23 MED ORDER — SODIUM CHLORIDE 0.9% FLUSH
10.0000 mL | Freq: Once | INTRAVENOUS | Status: AC | PRN
  Administered 2019-04-23: 10 mL
  Filled 2019-04-23: qty 10

## 2019-04-23 MED ORDER — SODIUM CHLORIDE 0.9 % IV SOLN
Freq: Once | INTRAVENOUS | Status: AC
  Administered 2019-04-23: 08:00:00 via INTRAVENOUS
  Filled 2019-04-23: qty 250

## 2019-04-23 MED ORDER — HEPARIN SOD (PORK) LOCK FLUSH 100 UNIT/ML IV SOLN
500.0000 [IU] | Freq: Once | INTRAVENOUS | Status: AC | PRN
  Administered 2019-04-23: 500 [IU]
  Filled 2019-04-23: qty 5

## 2019-04-23 NOTE — Patient Instructions (Signed)

## 2019-04-24 ENCOUNTER — Other Ambulatory Visit: Payer: Self-pay

## 2019-04-24 ENCOUNTER — Inpatient Hospital Stay: Payer: Medicare HMO | Attending: Hematology

## 2019-04-24 VITALS — BP 104/59 | HR 64 | Temp 98.7°F | Resp 18

## 2019-04-24 DIAGNOSIS — C321 Malignant neoplasm of supraglottis: Secondary | ICD-10-CM | POA: Insufficient documentation

## 2019-04-24 DIAGNOSIS — E871 Hypo-osmolality and hyponatremia: Secondary | ICD-10-CM | POA: Diagnosis not present

## 2019-04-24 DIAGNOSIS — T451X5A Adverse effect of antineoplastic and immunosuppressive drugs, initial encounter: Secondary | ICD-10-CM | POA: Diagnosis not present

## 2019-04-24 DIAGNOSIS — D6481 Anemia due to antineoplastic chemotherapy: Secondary | ICD-10-CM | POA: Diagnosis not present

## 2019-04-24 DIAGNOSIS — E039 Hypothyroidism, unspecified: Secondary | ICD-10-CM | POA: Insufficient documentation

## 2019-04-24 DIAGNOSIS — Z79899 Other long term (current) drug therapy: Secondary | ICD-10-CM | POA: Diagnosis not present

## 2019-04-24 DIAGNOSIS — Z95828 Presence of other vascular implants and grafts: Secondary | ICD-10-CM

## 2019-04-24 DIAGNOSIS — D638 Anemia in other chronic diseases classified elsewhere: Secondary | ICD-10-CM | POA: Diagnosis not present

## 2019-04-24 DIAGNOSIS — Z4682 Encounter for fitting and adjustment of non-vascular catheter: Secondary | ICD-10-CM | POA: Diagnosis not present

## 2019-04-24 DIAGNOSIS — E46 Unspecified protein-calorie malnutrition: Secondary | ICD-10-CM | POA: Insufficient documentation

## 2019-04-24 DIAGNOSIS — Z452 Encounter for adjustment and management of vascular access device: Secondary | ICD-10-CM | POA: Diagnosis present

## 2019-04-24 MED ORDER — HEPARIN SOD (PORK) LOCK FLUSH 100 UNIT/ML IV SOLN
250.0000 [IU] | Freq: Once | INTRAVENOUS | Status: AC | PRN
  Administered 2019-04-24: 10:00:00 500 [IU]
  Filled 2019-04-24: qty 5

## 2019-04-24 MED ORDER — SODIUM CHLORIDE 0.9 % IV SOLN
Freq: Once | INTRAVENOUS | Status: AC
  Administered 2019-04-24: 08:00:00 via INTRAVENOUS
  Filled 2019-04-24: qty 250

## 2019-04-24 MED ORDER — SODIUM CHLORIDE 0.9% FLUSH
3.0000 mL | Freq: Once | INTRAVENOUS | Status: AC | PRN
  Administered 2019-04-24: 10:00:00 10 mL
  Filled 2019-04-24: qty 10

## 2019-04-24 NOTE — Patient Instructions (Signed)

## 2019-04-26 ENCOUNTER — Telehealth: Payer: Self-pay | Admitting: Nutrition

## 2019-04-26 NOTE — Telephone Encounter (Signed)
Contacted patient to verify telephone visit for pre reg °

## 2019-04-27 ENCOUNTER — Inpatient Hospital Stay: Payer: Medicare HMO | Admitting: Nutrition

## 2019-04-27 NOTE — Progress Notes (Signed)
Nutrition follow-up completed with patient and [name] over the telephone. Sodium level decreased on July 29 230. Weight is stable at 149.3 pounds. MD requested further education on including higher sodium foods. Patient continues to tolerate 3-1/2 bottles of [facility] peptide 1.5 daily via feeding tube. She continues to drink boost plus usually 2 daily. She is not tolerating Pedialyte or Gatorade because it burns her throat. Per [name], patient is drinking a half a case of water daily ~ 12 bottles.   Nutrition diagnosis: Unintended weight loss stable.  Estimated nutrition needs: 2000-2200 cal, 100-115 g protein, 2.0 L fluid.  Intervention: Increase [employer] [place] to 4 bottles daily to provide 2000 cal and 96 g protein. Educated to decrease volume of water and explained this could be resulting in low sodium.  Recommended 64 ounces of water daily. Recommended patient try 1 cup of chicken broth daily. Recommended patient try watered-down Gatorade or Pedialyte.  Monitoring, evaluation, goals: Patient will tolerate calories protein and fluids to promote weight maintenance and increase overall sodium level.  Next visit: Patient to contact me with questions or concerns.  **Disclaimer: This note was dictated with voice recognition software. Similar sounding words can inadvertently be transcribed and this note may contain transcription errors which may not have been corrected upon publication of note.**

## 2019-05-04 DIAGNOSIS — E46 Unspecified protein-calorie malnutrition: Secondary | ICD-10-CM | POA: Diagnosis not present

## 2019-05-04 DIAGNOSIS — K1231 Oral mucositis (ulcerative) due to antineoplastic therapy: Secondary | ICD-10-CM | POA: Diagnosis not present

## 2019-05-04 DIAGNOSIS — T451X5A Adverse effect of antineoplastic and immunosuppressive drugs, initial encounter: Secondary | ICD-10-CM | POA: Diagnosis not present

## 2019-05-04 DIAGNOSIS — Z6826 Body mass index (BMI) 26.0-26.9, adult: Secondary | ICD-10-CM | POA: Diagnosis not present

## 2019-05-04 DIAGNOSIS — E039 Hypothyroidism, unspecified: Secondary | ICD-10-CM | POA: Diagnosis not present

## 2019-05-04 DIAGNOSIS — C321 Malignant neoplasm of supraglottis: Secondary | ICD-10-CM | POA: Diagnosis not present

## 2019-05-04 DIAGNOSIS — D6481 Anemia due to antineoplastic chemotherapy: Secondary | ICD-10-CM | POA: Diagnosis not present

## 2019-05-06 ENCOUNTER — Telehealth: Payer: Self-pay | Admitting: *Deleted

## 2019-05-06 NOTE — Telephone Encounter (Signed)
Noted. The patient's WBC was normal at the last visit. If she continues to have fever, she should call back for further instructions.  Dr. Maylon Peppers

## 2019-05-06 NOTE — Telephone Encounter (Signed)
Oncology Nurse Navigator Documentation  Received call from pt's SO [name] [name].  She indicated [name] [name] developed elevated temperature yesterday (100.4) that persisted early today (100) but is now 98.9.  Fever has reduced without taking antipyretic.  She reported no other symptoms.  I asked her to continue monitoring temp into the evening and tomorrow morning, to call me mid-morning with update.  She indicated [name] [name] has appt with Dr. Maylon Peppers next Wed, 8/19.   Dr. Maylon Peppers informed.  Gayleen Orem, RN, BSN Head & Neck Oncology Nurse [facility] [place] [phone]

## 2019-05-09 DIAGNOSIS — J383 Other diseases of vocal cords: Secondary | ICD-10-CM | POA: Diagnosis not present

## 2019-05-09 DIAGNOSIS — J387 Other diseases of larynx: Secondary | ICD-10-CM | POA: Diagnosis not present

## 2019-05-09 DIAGNOSIS — Z430 Encounter for attention to tracheostomy: Secondary | ICD-10-CM | POA: Diagnosis not present

## 2019-05-11 DIAGNOSIS — C329 Malignant neoplasm of larynx, unspecified: Secondary | ICD-10-CM | POA: Diagnosis not present

## 2019-05-11 DIAGNOSIS — Z930 Tracheostomy status: Secondary | ICD-10-CM | POA: Diagnosis not present

## 2019-05-12 ENCOUNTER — Inpatient Hospital Stay (HOSPITAL_BASED_OUTPATIENT_CLINIC_OR_DEPARTMENT_OTHER): Payer: Medicare HMO | Admitting: Hematology

## 2019-05-12 ENCOUNTER — Encounter: Payer: Self-pay | Admitting: Hematology

## 2019-05-12 ENCOUNTER — Telehealth: Payer: Self-pay | Admitting: Hematology

## 2019-05-12 ENCOUNTER — Inpatient Hospital Stay: Payer: Medicare HMO

## 2019-05-12 ENCOUNTER — Other Ambulatory Visit: Payer: Self-pay

## 2019-05-12 VITALS — BP 97/59 | HR 77 | Temp 100.0°F | Resp 21 | Ht 62.0 in | Wt 142.2 lb

## 2019-05-12 DIAGNOSIS — E039 Hypothyroidism, unspecified: Secondary | ICD-10-CM

## 2019-05-12 DIAGNOSIS — Z452 Encounter for adjustment and management of vascular access device: Secondary | ICD-10-CM | POA: Diagnosis not present

## 2019-05-12 DIAGNOSIS — D638 Anemia in other chronic diseases classified elsewhere: Secondary | ICD-10-CM

## 2019-05-12 DIAGNOSIS — T451X5A Adverse effect of antineoplastic and immunosuppressive drugs, initial encounter: Secondary | ICD-10-CM

## 2019-05-12 DIAGNOSIS — E46 Unspecified protein-calorie malnutrition: Secondary | ICD-10-CM | POA: Diagnosis not present

## 2019-05-12 DIAGNOSIS — D6481 Anemia due to antineoplastic chemotherapy: Secondary | ICD-10-CM | POA: Diagnosis not present

## 2019-05-12 DIAGNOSIS — C321 Malignant neoplasm of supraglottis: Secondary | ICD-10-CM | POA: Diagnosis not present

## 2019-05-12 DIAGNOSIS — Z4682 Encounter for fitting and adjustment of non-vascular catheter: Secondary | ICD-10-CM | POA: Diagnosis not present

## 2019-05-12 DIAGNOSIS — Z79899 Other long term (current) drug therapy: Secondary | ICD-10-CM

## 2019-05-12 DIAGNOSIS — E871 Hypo-osmolality and hyponatremia: Secondary | ICD-10-CM

## 2019-05-12 DIAGNOSIS — Z95828 Presence of other vascular implants and grafts: Secondary | ICD-10-CM

## 2019-05-12 LAB — CBC WITH DIFFERENTIAL (CANCER CENTER ONLY)
Abs Immature Granulocytes: 0.09 10*3/uL — ABNORMAL HIGH (ref 0.00–0.07)
Basophils Absolute: 0 10*3/uL (ref 0.0–0.1)
Basophils Relative: 0 %
Eosinophils Absolute: 0.1 10*3/uL (ref 0.0–0.5)
Eosinophils Relative: 1 %
HCT: 23.4 % — ABNORMAL LOW (ref 36.0–46.0)
Hemoglobin: 7.6 g/dL — ABNORMAL LOW (ref 12.0–15.0)
Immature Granulocytes: 1 %
Lymphocytes Relative: 11 %
Lymphs Abs: 1 10*3/uL (ref 0.7–4.0)
MCH: 28.7 pg (ref 26.0–34.0)
MCHC: 32.5 g/dL (ref 30.0–36.0)
MCV: 88.3 fL (ref 80.0–100.0)
Monocytes Absolute: 1.1 10*3/uL — ABNORMAL HIGH (ref 0.1–1.0)
Monocytes Relative: 13 %
Neutro Abs: 6.6 10*3/uL (ref 1.7–7.7)
Neutrophils Relative %: 74 %
Platelet Count: 311 10*3/uL (ref 150–400)
RBC: 2.65 MIL/uL — ABNORMAL LOW (ref 3.87–5.11)
RDW: 19.3 % — ABNORMAL HIGH (ref 11.5–15.5)
WBC Count: 9 10*3/uL (ref 4.0–10.5)
nRBC: 0 % (ref 0.0–0.2)

## 2019-05-12 LAB — CMP (CANCER CENTER ONLY)
ALT: 26 U/L (ref 0–44)
AST: 27 U/L (ref 15–41)
Albumin: 2.6 g/dL — ABNORMAL LOW (ref 3.5–5.0)
Alkaline Phosphatase: 109 U/L (ref 38–126)
Anion gap: 11 (ref 5–15)
BUN: 18 mg/dL (ref 8–23)
CO2: 23 mmol/L (ref 22–32)
Calcium: 9 mg/dL (ref 8.9–10.3)
Chloride: 96 mmol/L — ABNORMAL LOW (ref 98–111)
Creatinine: 0.72 mg/dL (ref 0.44–1.00)
GFR, Est AFR Am: >60 mL/min (ref >60)
GFR, Estimated: >60 mL/min (ref >60)
Glucose, Bld: 113 mg/dL — ABNORMAL HIGH (ref 70–99)
Potassium: 4.2 mmol/L (ref 3.5–5.1)
Sodium: 130 mmol/L — ABNORMAL LOW (ref 135–145)
Total Bilirubin: 0.3 mg/dL (ref 0.3–1.2)
Total Protein: 7.2 g/dL (ref 6.5–8.1)

## 2019-05-12 LAB — IRON AND TIBC
Iron: 10 ug/dL — ABNORMAL LOW (ref 41–142)
Saturation Ratios: 5 % — ABNORMAL LOW (ref 21–57)
TIBC: 191 ug/dL — ABNORMAL LOW (ref 236–444)
UIBC: 182 ug/dL (ref 120–384)

## 2019-05-12 LAB — T4, FREE: Free T4: 1.02 ng/dL (ref 0.61–1.12)

## 2019-05-12 LAB — FERRITIN: Ferritin: 575 ng/mL — ABNORMAL HIGH (ref 11–307)

## 2019-05-12 LAB — TSH: TSH: 3.311 u[IU]/mL (ref 0.308–3.960)

## 2019-05-12 MED ORDER — SODIUM CHLORIDE 0.9% FLUSH
10.0000 mL | INTRAVENOUS | Status: DC | PRN
  Filled 2019-05-12: qty 10

## 2019-05-12 MED ORDER — HEPARIN SOD (PORK) LOCK FLUSH 100 UNIT/ML IV SOLN
500.0000 [IU] | Freq: Once | INTRAVENOUS | Status: DC | PRN
  Filled 2019-05-12: qty 5

## 2019-05-12 MED ORDER — ALTEPLASE 2 MG IJ SOLR
2.0000 mg | Freq: Once | INTRAMUSCULAR | Status: DC | PRN
  Filled 2019-05-12: qty 2

## 2019-05-12 MED ORDER — HEPARIN SOD (PORK) LOCK FLUSH 100 UNIT/ML IV SOLN
500.0000 [IU] | Freq: Once | INTRAVENOUS | Status: AC | PRN
  Administered 2019-05-12: 11:00:00 500 [IU]
  Filled 2019-05-12: qty 5

## 2019-05-12 MED ORDER — SODIUM CHLORIDE 0.9% FLUSH
10.0000 mL | Freq: Once | INTRAVENOUS | Status: AC | PRN
  Administered 2019-05-12: 10 mL
  Filled 2019-05-12: qty 10

## 2019-05-12 NOTE — Patient Instructions (Signed)

## 2019-05-12 NOTE — Progress Notes (Signed)
[place] OFFICE PROGRESS NOTE  Patient Care Team: Philmore Pali, NP as PCP - General (Nurse Practitioner) Eppie Gibson, MD as Attending Physician (Radiation Oncology) Tish Men, MD as Consulting Physician (Hematology) Leota Sauers, RN as Oncology Nurse Navigator Karie Mainland, RD as Dietitian (Nutrition) Sharen Counter, CCC-SLP as Speech Language Pathologist (Speech Pathology)  HEME/ONC OVERVIEW: 1.  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 -11/2018:   3.1 x 2.8cm left supraglottic laryngeal mass extending across the anterior commissure and invading the prelaryngeal fat and suspicious <1cm bilateral Level II cervical LN's on CT  Transferred to [facility] for airway compromise and taken to OR for tracheostomy; multiple bx'es I have -12/2018: repeat DL with bx of the supraglottic mass showed well-differentiated invasive squamous cell carcinoma  -01/2019: PET showed locally advanced disease, including bilateral cervical LN involvement; mildly FDG-avid mediastinal LN's, bx negative for malignancy -Mid-01/2019 - 03/2019: definitive chemoradiation with weekly cisplatin x 7 doses  2. 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 Erik Obey, who was concerned about residual/recurrent disease in the larynx; per his note, he plans to obtain CT neck and chest to assess for any metastatic disease, and if no, he was going to discuss with the ENT at [facility] regarding total laryngectomy with free flap -I reviewed the laryngoscopy results in detail with the patient -I have reached out to Dr. Noreene Filbert office to confirm the plan,  and asked the H&N navigator to update [place] regarding any surgical plan -Pending CT results and ENT evaluation, we will determine if any systemic therapy is indicated -See the management of chemotherapy-related toxicities below -PRN anti-emetics: Zofran, Compazine, Ativan and dexamethasone   Chemotherapy-associated anemia -Secondary to chemotherapy and hemodilution -Hgb 7.6, lower than last visit  -Patient denies any symptom of bleeding; she drinks ~100 oz of fluid per day  -Iron profile c/w anemia of chronic disease  -I have instructed the patient to reduce her free water intake to ~48-60oz per day  -We will monitor for now  Hyponatremia  -Na 130, persistently low -Likely due to excess free water intake -I have instructed the patient to reduce her free water intake to ~48-60oz per day, and to add some NaCl supplement to her fluid intake  -Continue periodic supplementation with Pedialyte and adhere to nutritional recommendations  -We will monitor it closely   Protein malnutrition -Secondary to chemoradiation -Weight down slightly since last visit -Currently taking 3-4 cans of [employer] via the feeding tube as well as several bottles of water mixed with Pedialyte via feeding tube and by mouth -I encouraged patient to adhere to nutritional recommendations, and as her mucositis continues to improve, she may also resume oral intake as tolerated   Severe hypothyroidism -TSH 69 with free T4 0.33 in late 03/2019, c/w severe hypothyrodism, likely due to missing medication from mucositis  -I counseled the patient on the importance of medication adherence -Repeat TSH improved to 3.31 today; free T4 pending  -Currently on Synthroid 185mcg alternating with 18mcg -I have sent records of thyroid studies to the patient's endocrinologist Dr. Chauncy Passy at [facility] for further management  Orders Placed This Encounter  Procedures  . CBC with Differential (Cancer Center Only)    Standing  Status:   Future    Standing Expiration Date:   06/15/2020  . CMP ([place] only)    Standing Status:   Future    Standing Expiration Date:   06/15/2020  . TSH    Standing Status:   Future    Standing Expiration Date:   05/11/2020  . T4, free    Standing Status:   Future    Standing Expiration Date:   05/11/2020    All questions were answered. The patient knows to call the clinic with any problems, questions or concerns. No barriers to learning was detected.  Tentatively return in 4 weeks for labs, port flush and clinic appt.   Tish Men, MD 05/12/2019 12:00 PM  CHIEF COMPLAINT: "I just had my trach changed "  INTERVAL HISTORY: Ms. Sturgill returns to clinic for follow-up of squamous cell carcinoma of the supraglottic larynx s/p definitive chemoradiation.  She saw Dr. Erik Obey on 9/56/2130, who performed a laryngoscopy and showed possible residual/recurrent disease in the larynx.  According to his note, he has ordered CT chest and neck to assess for any metastatic disease, and if negative, he was going to discuss with ENT at [facility] regarding total laryngectomy with free flap.  Patient reports that she has been able to drink liquids without any difficulty, but has not yet resumed any solid food by mouth.  She is currently taking 34 cans of [facility] via the feeding tube per day.  She drinks approximately a total of 90 ounces of fluid per day (water mixed with Pedialyte).  She denies any other complaint today.  SUMMARY OF ONCOLOGIC HISTORY: Oncology History  Malignant neoplasm of supraglottis ([place])  12/02/2018 Imaging   CT neck: IMPRESSION: 1. Supraglottic laryngeal carcinoma extending across the anterior commissure and invading the prelaryngeal fat, left aryepiglottic fold and left aspect of the epiglottis. 2. Severe narrowing of the laryngeal airway. 3. Bilateral subcentimeter level 2A and 2A cervical lymph nodes.  Critical Value/emergent results were called by telephone at the  time of interpretation on 12/02/2018 at 5:34 pm to Dr. Collier Salina, who verbally acknowledged these results.   12/02/2018 Imaging   CT  Chest:  IMPRESSION: No evidence of metastatic disease in the chest.   12/03/2018 Pathology Results   (Care Everywhere; [facility]) A: Larynx, supraglottis, biopsy - Atypical squamous proliferation with at least low grade dysplasia and abundant keratinization - No definite high grade dysplasia or invasive carcinoma represented (see comment)   12/18/2018 Pathology Results   (Care Everywhere; [facility]) A:  Neck, left, fine needle aspiration - Rare single atypical cells, no cells diagnostic of malignancy - Lymph node material present  B:  Neck, right, fine needle aspiration - Non-diagnostic specimen - No lymph node material identified - Mixed inflammation and blood   12/31/2018 Pathology Results   (Care Everywhere; [facility]  A: Larynx, left supraglottic mass, biopsy - Atypical squamous proliferation with abundant keratinization, suspicious for well-differentiated invasive squamous cell carcinoma  B: Larynx, left supraglottic mass, biopsy - Well-differentiated invasive squamous cell carcinoma with abundant keratinization (see comment)   01/18/2019 Initial Diagnosis   Laryngeal cancer ([place])   01/26/2019 Imaging   PET: IMPRESSION: 1. Locally advanced hypermetabolic laryngeal neoplasm centered in the left supraglottic space with bilateral glottic and epiglottic involvement and subglottic extension on the left. 2. Hypermetabolic bilateral level 3 neck nodal metastases. 3. Hypermetabolic high mediastinal  nodal metastases between the trachea and upper thoracic esophagus. 4. Otherwise no distant hypermetabolic metastatic disease. 5. Nodular 7 mm focus at the left lung base, below PET resolution, recommend attention on follow-up chest CT in 3 months. 6. Chronic findings include: Aortic Atherosclerosis (ICD10-I70.0). Cholelithiasis.    02/08/2019 Cancer Staging   Staging form: Larynx - Supraglottis, AJCC 8th Edition - Clinical stage from 02/08/2019: Stage IVA (cT3, cN2c, cM0) - Signed by Eppie Gibson, MD on 02/08/2019   02/11/2019 - 04/07/2019 Chemotherapy   The patient had palonosetron (ALOXI) injection 0.25 mg, 0.25 mg, Intravenous,  Once, 7 of 7 cycles Administration: 0.25 mg (02/11/2019), 0.25 mg (02/18/2019), 0.25 mg (03/04/2019), 0.25 mg (03/11/2019), 0.25 mg (03/18/2019), 0.25 mg (03/25/2019), 0.25 mg (04/01/2019) CISplatin (PLATINOL) 72 mg in sodium chloride 0.9 % 250 mL chemo infusion, 40 mg/m2 = 72 mg, Intravenous,  Once, 7 of 7 cycles Administration: 72 mg (02/11/2019), 72 mg (02/18/2019), 72 mg (03/04/2019), 72 mg (03/11/2019), 72 mg (03/18/2019), 72 mg (03/25/2019), 72 mg (04/01/2019) fosaprepitant (EMEND) 150 mg, dexamethasone (DECADRON) 12 mg in sodium chloride 0.9 % 145 mL IVPB, , Intravenous,  Once, 7 of 7 cycles Administration:  (02/11/2019),  (02/18/2019),  (03/04/2019),  (03/11/2019),  (03/18/2019),  (03/25/2019),  (04/01/2019)  for chemotherapy treatment.      REVIEW OF SYSTEMS:   Constitutional: ( - ) fevers, ( - )  chills , ( - ) night sweats Eyes: ( - ) blurriness of vision, ( - ) double vision, ( - ) watery eyes Ears, nose, mouth, throat, and face: ( - ) mucositis, ( + ) sore throat Respiratory: ( - ) cough, ( - ) dyspnea, ( - ) wheezes Cardiovascular: ( - ) palpitation, ( - ) chest discomfort, ( - ) lower extremity swelling Gastrointestinal:  ( - ) nausea, ( - ) heartburn, ( - ) change in bowel habits Skin: ( - ) abnormal skin rashes Lymphatics: ( - ) new lymphadenopathy, ( - ) easy bruising Neurological: ( - ) numbness, ( - ) tingling, ( - ) new weaknesses Behavioral/Psych: ( - ) mood change, ( - ) new changes  All other systems were reviewed with the patient and are negative.  I have reviewed the past medical history, past surgical history, social history and family history with the patient and they are unchanged from  previous note.  ALLERGIES:  has No Known Allergies.  MEDICATIONS:  Current Outpatient Medications  Medication Sig Dispense Refill  . chlorhexidine (PERIDEX) 0.12 % solution Rinse with 15 mls twice daily for 30 seconds. Use after breakfast and at bedtime. Spit out excess. Do not swallow. (Patient taking differently: Use as directed 15 mLs in the mouth or throat 2 (two) times daily. Rinse with 15 mls twice daily for 30 seconds. Use after breakfast and at bedtime. Spit out excess. Do not swallow.) 480 mL prn  . docusate sodium (COLACE) 100 MG capsule Take 100 mg by mouth 2 (two) times daily.    Marland Kitchen levothyroxine (SYNTHROID) 150 MCG tablet Take 150 mcg by mouth every other day. Alternating with 175 mcg dose    . levothyroxine (SYNTHROID) 175 MCG tablet Take 175 mcg by mouth every other day. Alternating with 150 mcg dose    . lidocaine (XYLOCAINE) 2 % solution Patient: Mix 1part 2% viscous lidocaine, 1part H20. Swallow 43mL of diluted mixture, 71min before meals and at bedtime, up to QID prn soreness 100 mL 5  . LORazepam (ATIVAN) 0.5 MG tablet Take 1 tablet (0.5 mg total)  by mouth every 6 (six) hours as needed (Nausea or vomiting). 30 tablet 0  . Morphine Sulfate (MORPHINE CONCENTRATE) 10 mg / 0.5 ml concentrated solution Take 0.5 mLs (10 mg total) by mouth every 6 (six) hours as needed for severe pain. 90 mL 0  . Nutritional Supplements (KATE FARMS PEPTIDE 1.5) LIQD Take 4 Bottles by mouth daily. 325 mL 6  . sodium fluoride (PREVIDENT 5000 PLUS) 1.1 % CREA dental cream Apply cream to tooth brush. Brush teeth for 2 minutes. Spit out excess. DO NOT rinse afterwards. Repeat nightly. (Patient taking differently: Place 1 application onto teeth 2 (two) times a day. ) 1 Tube prn   No current facility-administered medications for this visit.     PHYSICAL EXAMINATION: ECOG PERFORMANCE STATUS: 1 - Symptomatic but completely ambulatory  Today's Vitals   05/12/19 1107 05/12/19 1109  BP: (!) 97/59   Pulse:  77   Resp: (!) 21   Temp: 100 F (37.8 C)   TempSrc: Temporal   SpO2: 100%   Weight: 142 lb 3.2 oz (64.5 kg)   Height: 5\' 2"  (1.575 m)   PainSc:  0-No pain   Body mass index is 26.01 kg/m.  Filed Weights   05/12/19 1107  Weight: 142 lb 3.2 oz (64.5 kg)    GENERAL: alert, no distress and comfortable SKIN: skin color, texture, turgor are normal, no rashes or significant lesions EYES: conjunctiva are pink and non-injected, sclera clear OROPHARYNX: no exudate, no erythema; lips, buccal mucosa, and tongue normal  NECK: supple, mildly sore, tracheostomy in place  LYMPH:  no definite palable lymphadenopathy in the cervical LUNGS: clear to auscultation with normal breathing effort HEART: regular rate & rhythm and no murmurs and no lower extremity edema ABDOMEN: soft, non-tender, non-distended, normal bowel sounds Musculoskeletal: no cyanosis of digits and no clubbing  PSYCH: alert & oriented x 3, whispering speech due to tracheostomy NEURO: no focal motor/sensory deficits  LABORATORY DATA:  I have reviewed the data as listed    Component Value Date/Time   NA 130 (L) 05/12/2019 1013   K 4.2 05/12/2019 1013   CL 96 (L) 05/12/2019 1013   CO2 23 05/12/2019 1013   GLUCOSE 113 (H) 05/12/2019 1013   BUN 18 05/12/2019 1013   CREATININE 0.72 05/12/2019 1013   CALCIUM 9.0 05/12/2019 1013   PROT 7.2 05/12/2019 1013   ALBUMIN 2.6 (L) 05/12/2019 1013   AST 27 05/12/2019 1013   ALT 26 05/12/2019 1013   ALKPHOS 109 05/12/2019 1013   BILITOT 0.3 05/12/2019 1013   GFRNONAA >60 05/12/2019 1013   GFRAA >60 05/12/2019 1013    No results found for: SPEP, UPEP  Lab Results  Component Value Date   WBC 9.0 05/12/2019   NEUTROABS 6.6 05/12/2019   HGB 7.6 (L) 05/12/2019   HCT 23.4 (L) 05/12/2019   MCV 88.3 05/12/2019   PLT 311 05/12/2019      Chemistry      Component Value Date/Time   NA 130 (L) 05/12/2019 1013   K 4.2 05/12/2019 1013   CL 96 (L) 05/12/2019 1013   CO2 23  05/12/2019 1013   BUN 18 05/12/2019 1013   CREATININE 0.72 05/12/2019 1013      Component Value Date/Time   CALCIUM 9.0 05/12/2019 1013   ALKPHOS 109 05/12/2019 1013   AST 27 05/12/2019 1013   ALT 26 05/12/2019 1013   BILITOT 0.3 05/12/2019 1013       RADIOGRAPHIC STUDIES: I have personally reviewed the  radiological images as listed below and agreed with the findings in the report. No results found.

## 2019-05-12 NOTE — Telephone Encounter (Signed)
Scheduled per 08/19 los, patient received calender and avs.

## 2019-05-13 LAB — SOLUBLE TRANSFERRIN RECEPTOR: Transferrin Receptor: 29.9 nmol/L — ABNORMAL HIGH (ref 12.2–27.3)

## 2019-05-14 ENCOUNTER — Telehealth: Payer: Self-pay | Admitting: *Deleted

## 2019-05-14 NOTE — Telephone Encounter (Signed)
Faxed lab results done 05/12/19 to Charlott Holler, NP @ [facility] as per Dr. Lorette Ang instructions. Charlott Holler      Phone    [phone]       ;    Fax      [phone].

## 2019-05-17 ENCOUNTER — Ambulatory Visit: Payer: Medicare HMO

## 2019-05-17 DIAGNOSIS — J383 Other diseases of vocal cords: Secondary | ICD-10-CM | POA: Diagnosis not present

## 2019-05-17 DIAGNOSIS — J387 Other diseases of larynx: Secondary | ICD-10-CM | POA: Diagnosis not present

## 2019-05-17 DIAGNOSIS — Z430 Encounter for attention to tracheostomy: Secondary | ICD-10-CM | POA: Diagnosis not present

## 2019-05-27 ENCOUNTER — Other Ambulatory Visit: Payer: Self-pay | Admitting: Otolaryngology

## 2019-05-27 DIAGNOSIS — C329 Malignant neoplasm of larynx, unspecified: Secondary | ICD-10-CM

## 2019-06-03 ENCOUNTER — Ambulatory Visit
Admission: RE | Admit: 2019-06-03 | Discharge: 2019-06-03 | Disposition: A | Discharge status: Home / Self Care | Payer: Medicare HMO | Source: Ambulatory Visit | Attending: Otolaryngology | Admitting: Otolaryngology

## 2019-06-03 ENCOUNTER — Other Ambulatory Visit: Payer: Self-pay

## 2019-06-03 ENCOUNTER — Other Ambulatory Visit: Payer: Medicare HMO

## 2019-06-03 DIAGNOSIS — C329 Malignant neoplasm of larynx, unspecified: Secondary | ICD-10-CM

## 2019-06-03 DIAGNOSIS — C321 Malignant neoplasm of supraglottis: Secondary | ICD-10-CM | POA: Diagnosis not present

## 2019-06-03 DIAGNOSIS — Z5111 Encounter for antineoplastic chemotherapy: Secondary | ICD-10-CM | POA: Diagnosis not present

## 2019-06-03 IMAGING — CT CT CHEST W/ CM
1 of 2 series · 15 of 30 positions shown, 19 images · IV contrast (APPLIED)
Comparison: Chest CT [DATE], [REDACTED]

CLINICAL DATA: Supraglottic laryngeal carcinoma. Status post chemo
radiation.

EXAM:
CT CHEST WITH CONTRAST
TECHNIQUE: Multidetector CT imaging of the chest was performed during
intravenous contrast administration.
CONTRAST:  100mL [7B] IOPAMIDOL ([7B]) INJECTION 61%

[Series 8: super d · axial · 0.71mm/px · z∈[-500,-176]mm · 15 of 366 slices shown, 19 images]
[im 21/366  mediastinal]
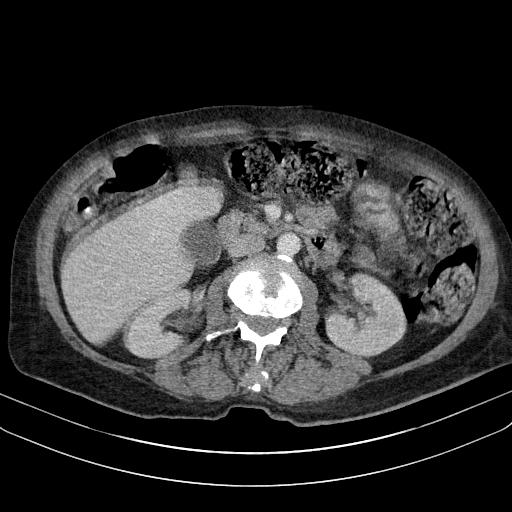
[im 21/366  lung]
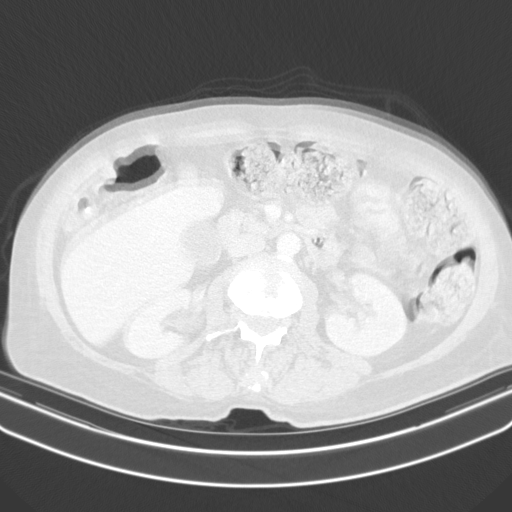
[im 41/366  lung]
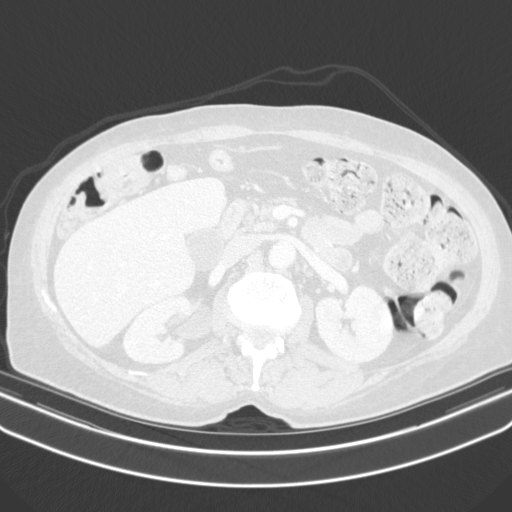
[im 82/366  lung]
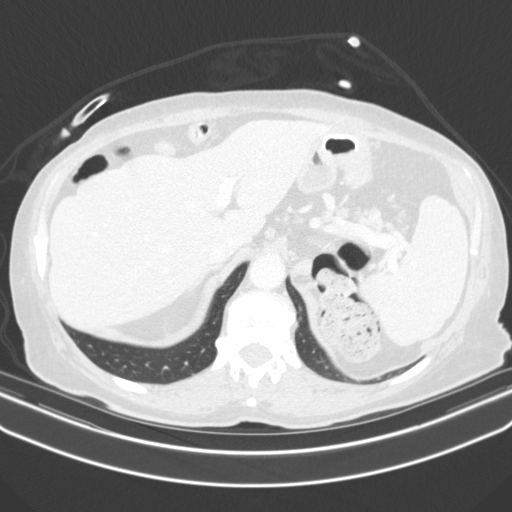
[im 102/366  lung]
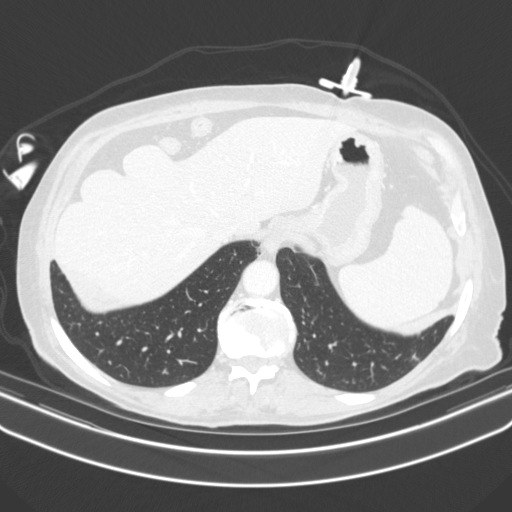
[im 122/366  mediastinal]
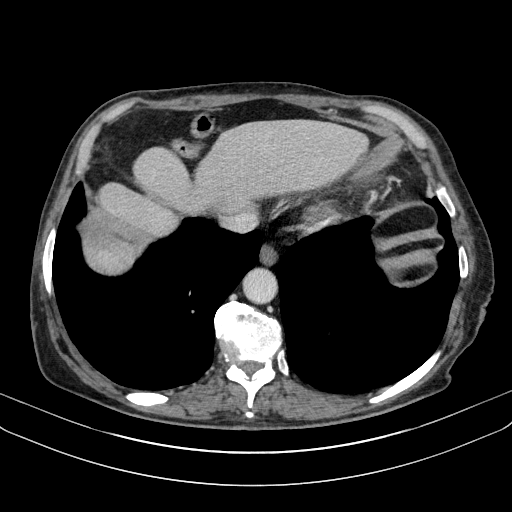
[im 122/366  lung]
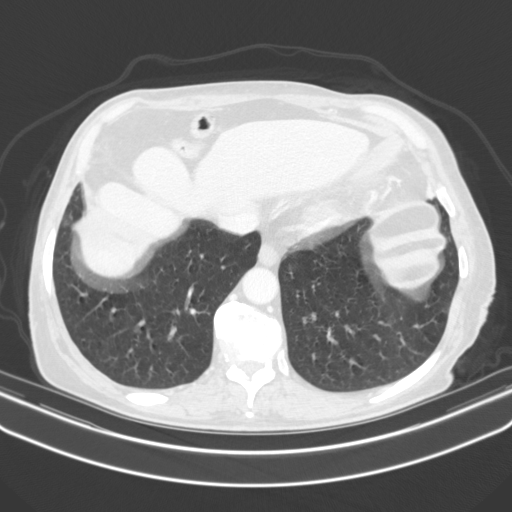
[im 142/366  lung]
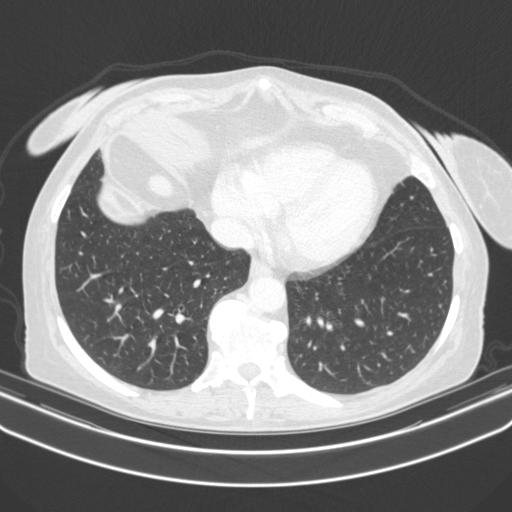
[im 163/366  lung]
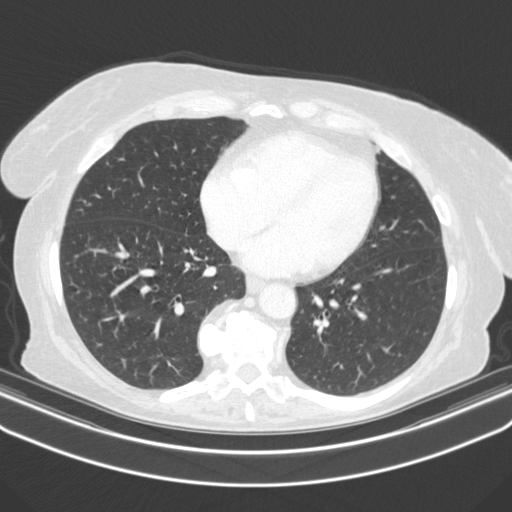
[im 183/366  lung]
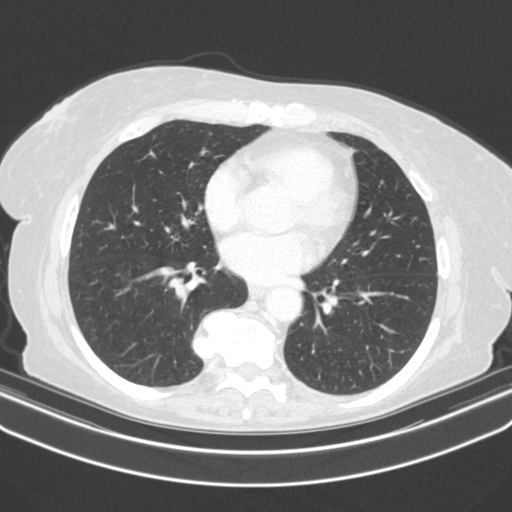
[im 203/366  mediastinal]
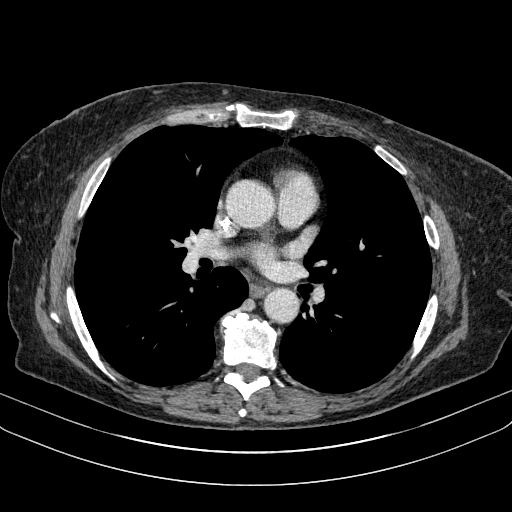
[im 203/366  lung]
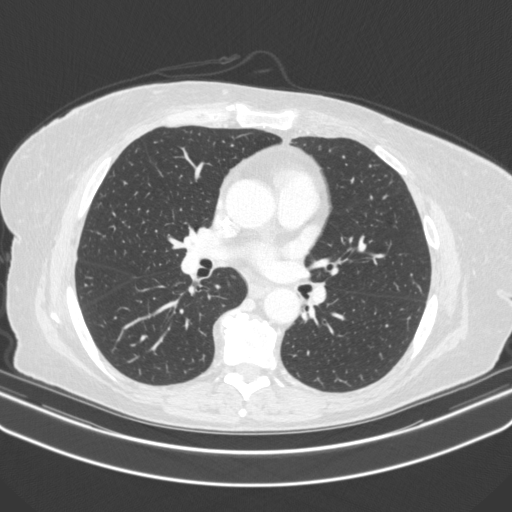
[im 224/366  lung]
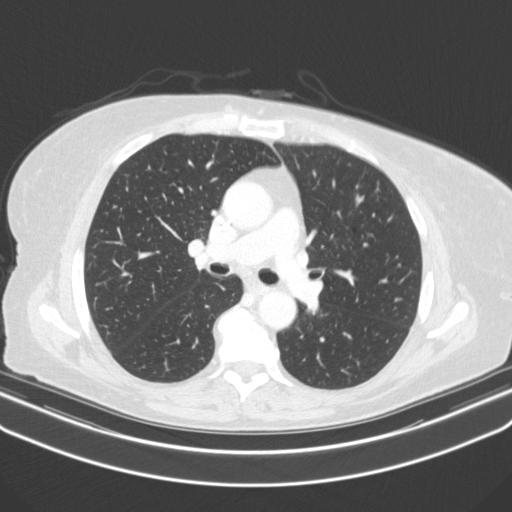
[im 244/366  lung]
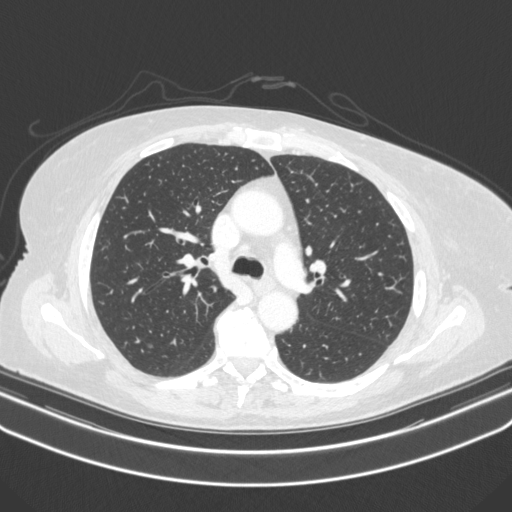
[im 264/366  lung]
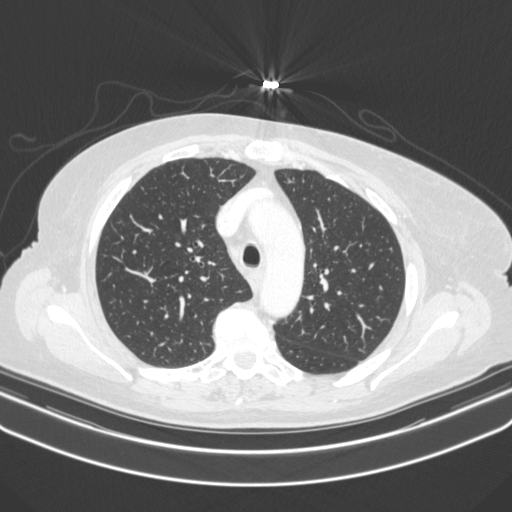
[im 305/366  mediastinal]
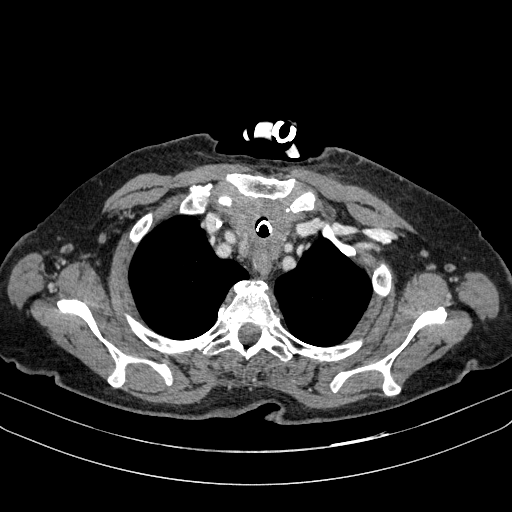
[im 305/366  lung]
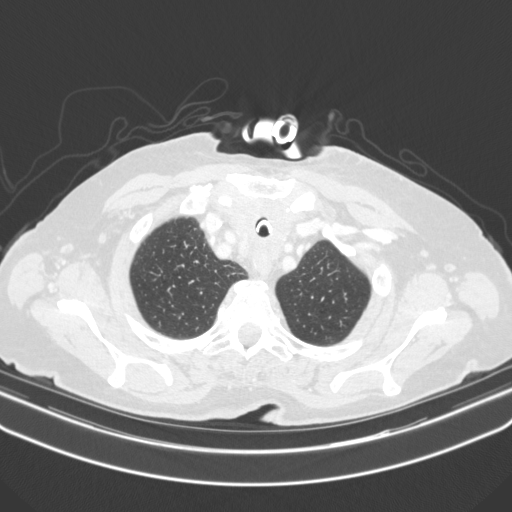
[im 325/366  lung]
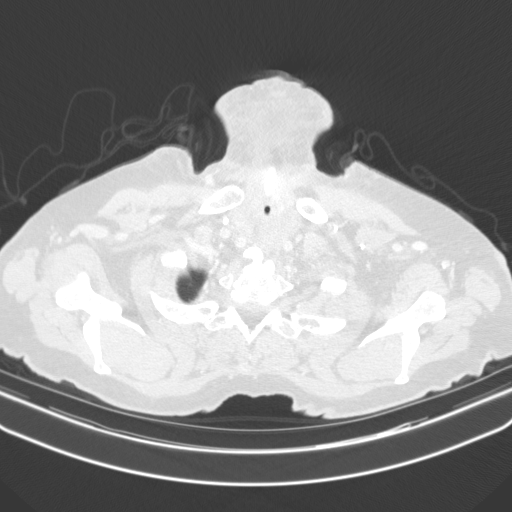
[im 345/366  lung]
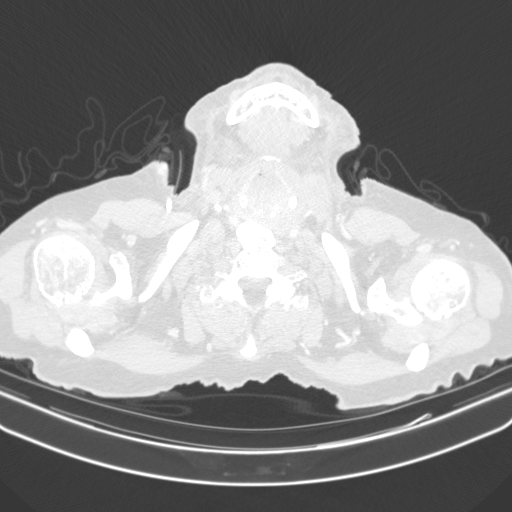

[15 of 30 positions shown; findings below may reference images not displayed]

FINDINGS: Cardiovascular: No significant vascular findings. Normal heart size.
No pericardial effusion.

Mediastinum/Nodes: No axillary or supraclavicular adenopathy.
Tracheostomy tube in place. Soft tissue filling of the larynx and
supraglottic space similar to prior.

LEFT paratracheal lymph node measuring 8 mm (image 43/4) compared to
6 mm on CT [DATE]. No hilar adenopathy.

Lungs/Pleura: No suspicious nodularity.  Airways normal.

Upper Abdomen: Percutaneous gastrostomy tube in the stomach. Adrenal
glands normal. No upper abdominal adenopathy. Gallstone noted.

Musculoskeletal: No aggressive osseous lesion. Degenerative
osteophytosis of the spine. Hemangioma in the T7 vertebral body.
IMPRESSION: 1. Fullness in supraglottic tissues not changed from comparison
exam.
2. A prominent LEFT paratracheal lymph node adjacent to the aortic
arch not pathologic by size criteria but measures slightly larger.
Some variance in technique. Recommend attention on follow-up.
3. No suspicious pulmonary nodules.

## 2019-06-03 IMAGING — CT CT NECK W/ CM
4 of 6 series · 13 of 33 positions shown, 15 images · IV contrast (APPLIED)
Comparison: CT neck [DATE]

CLINICAL DATA: Laryngeal cancer follow-up. Radiation and
chemotherapy treatment

EXAM:
CT NECK WITH CONTRAST
TECHNIQUE: Multidetector CT imaging of the neck was performed using the
standard protocol following the bolus administration of intravenous
contrast.
CONTRAST:  100mL [KN] IOPAMIDOL ([KN]) INJECTION 61%

[Series 3: neck · axial · 0.57mm/px · z∈[-198,-126]mm · 2 of 110 slices shown]
[im 37/110  bone]
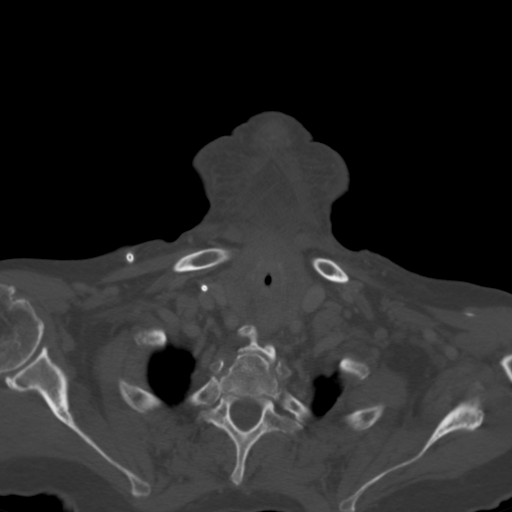
[im 73/110  bone]
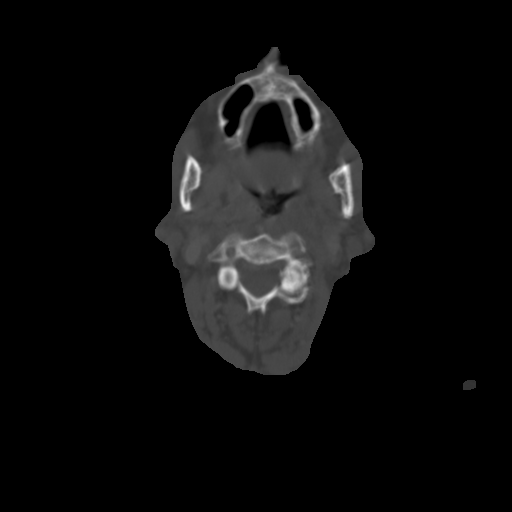

[Series 11: sag · sagittal · 0.44mm/px · 5 of 123 slices shown, 6 images]
[im 41/123  bone]
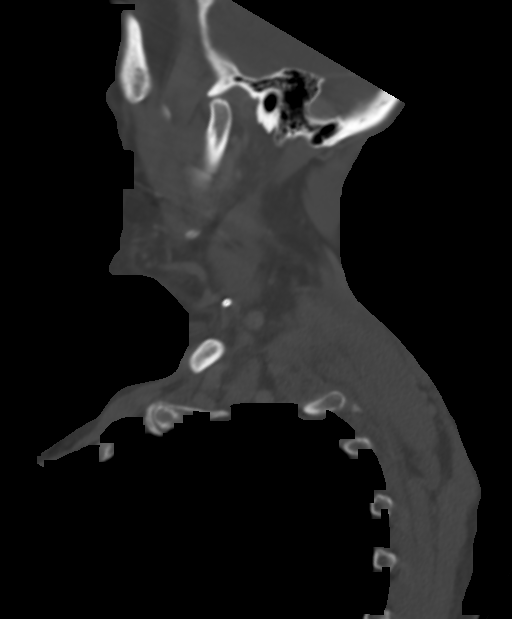
[im 51/123  bone]
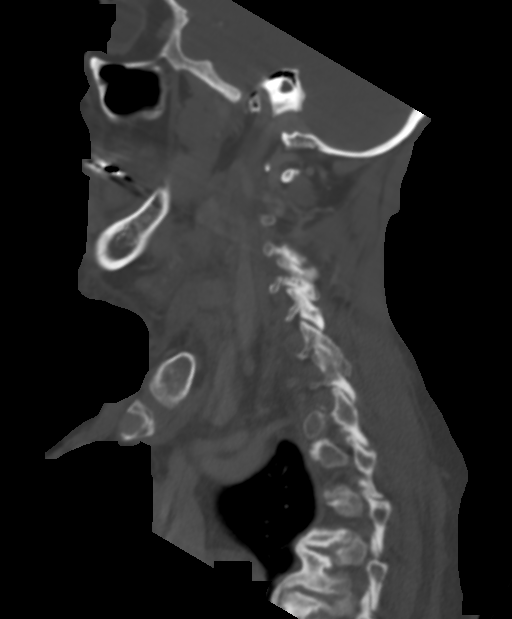
[im 62/123  soft-tissue]
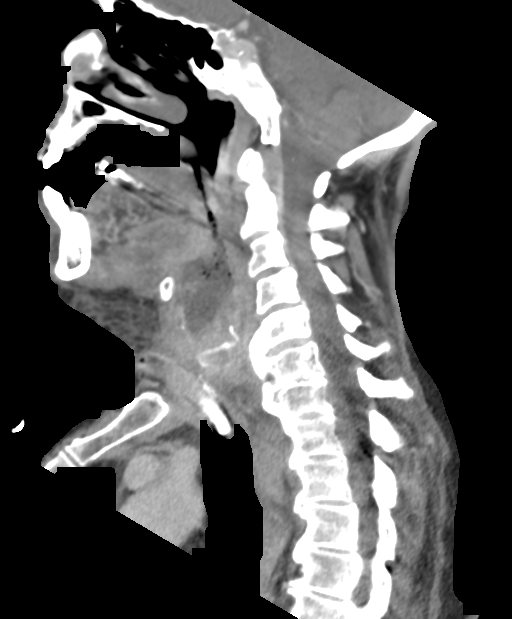
[im 62/123  bone]
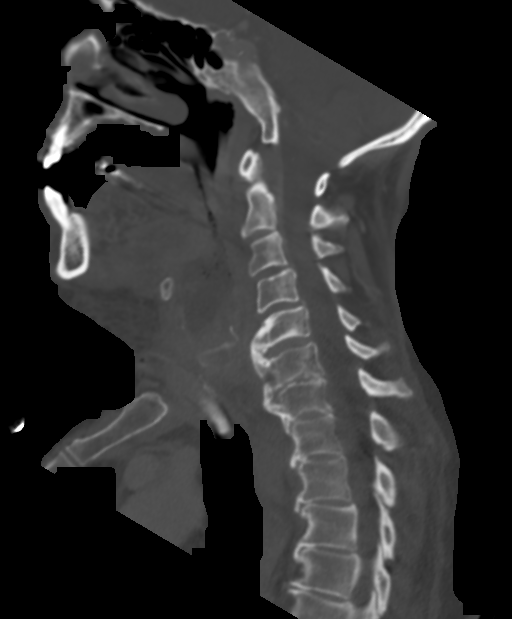
[im 72/123  bone]
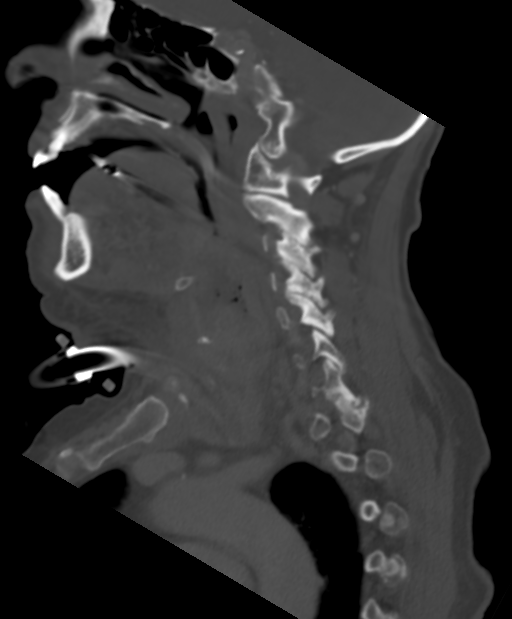
[im 82/123  bone]
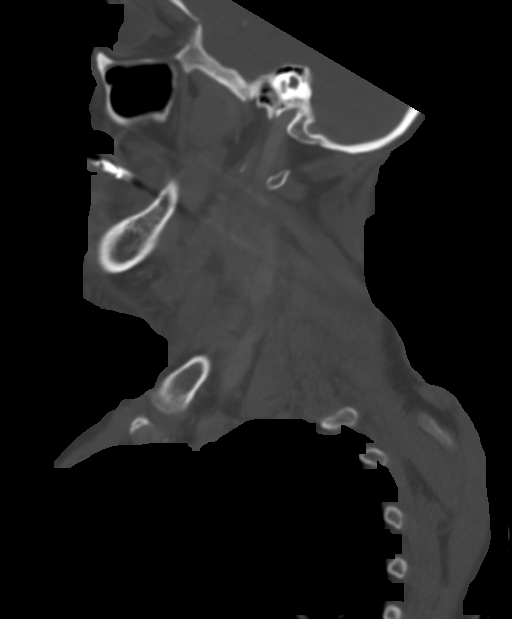

[Series 12: cor · coronal · 0.48mm/px · 3 of 98 slices shown]
[im 36/98  bone]
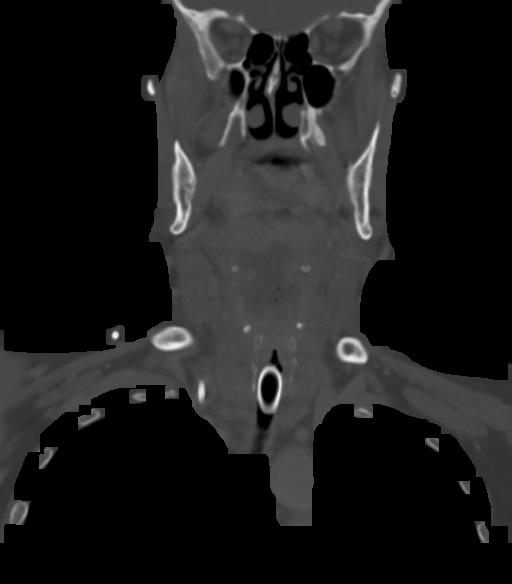
[im 44/98  bone]
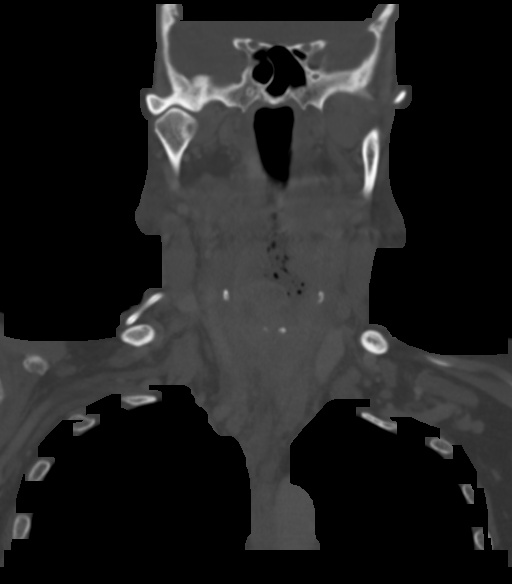
[im 53/98  bone]
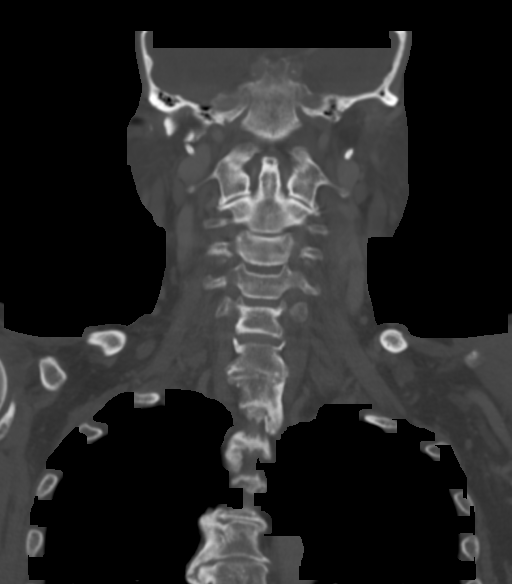

[Series 13: angled axial · axial · 0.45mm/px · z∈[-265,-163]mm · 3 of 115 slices shown, 4 images]
[im 29/115  soft-tissue]
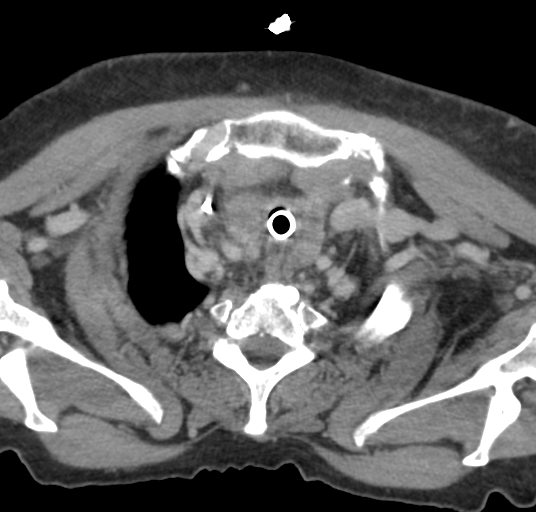
[im 29/115  bone]
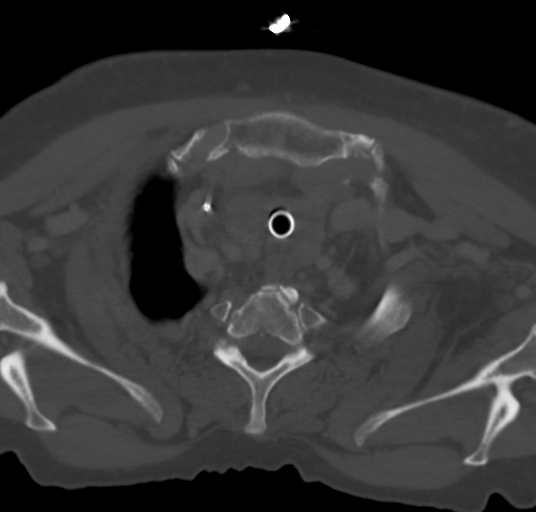
[im 58/115  bone]
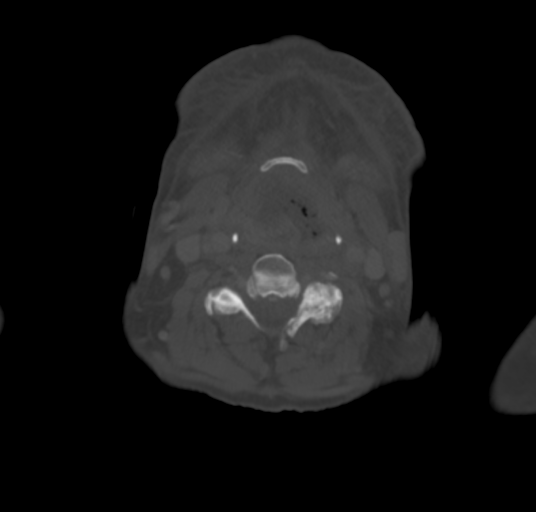
[im 86/115  bone]
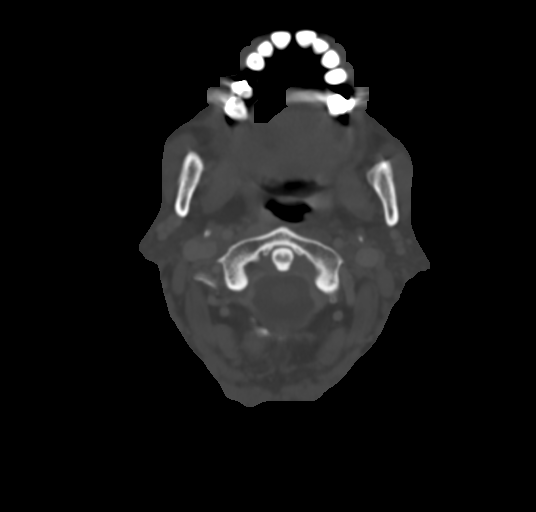

[13 of 33 positions shown; findings below may reference images not displayed]

FINDINGS: Pharynx and larynx: Interval tracheostomy. Bulky laryngeal tumor on
the left is now obscured by post radiation edema and hypopharyngeal
secretions. It is difficult to determine the extent of residual
tumor. Tracheostomy in good position.

Salivary glands: No inflammation, mass, or stone.

Thyroid: Negative

Lymph nodes: Left level 3 node just below the hyoid bone measures 5
mm, previously 9 mm. Right supraclavicular node 7 mm previously 4
mm. Diffuse soft tissue edema from radiation makes lymph node
evaluation difficult

Vascular: Normal vascular enhancement

Limited intracranial: Negative

Visualized orbits: Negative

Mastoids and visualized paranasal sinuses: Negative

Skeleton: Cervical spondylosis and facet degeneration. No acute
abnormality.

Upper chest: Lung apices clear bilaterally

Other: Anterior soft tissue edema of the neck compatible with
radiation change.
IMPRESSION: 1. Bulky tumor left larynx difficult to evaluate due to post
radiation edema and secretions in the hypopharynx. Overall tumor
bulk appears improved.
2. Interval tracheostomy.
3. No pathologic lymph nodes seen on the prior study however left
level 3 node is smaller and right supraclavicular node 7 mm is
larger. Continued follow-up recommended.
4. Post radiation edema in the neck.

## 2019-06-03 MED ORDER — IOPAMIDOL (ISOVUE-300) INJECTION 61%
100.0000 mL | Freq: Once | INTRAVENOUS | Status: AC | PRN
  Administered 2019-06-03: 100 mL via INTRAVENOUS

## 2019-06-09 ENCOUNTER — Telehealth: Payer: Self-pay | Admitting: Hematology

## 2019-06-09 ENCOUNTER — Inpatient Hospital Stay: Payer: Medicare HMO

## 2019-06-09 ENCOUNTER — Encounter: Payer: Self-pay | Admitting: Hematology

## 2019-06-09 ENCOUNTER — Inpatient Hospital Stay: Payer: Medicare HMO | Attending: Hematology

## 2019-06-09 ENCOUNTER — Inpatient Hospital Stay (HOSPITAL_BASED_OUTPATIENT_CLINIC_OR_DEPARTMENT_OTHER): Payer: Medicare HMO | Admitting: Hematology

## 2019-06-09 ENCOUNTER — Telehealth: Payer: Self-pay | Admitting: *Deleted

## 2019-06-09 ENCOUNTER — Other Ambulatory Visit: Payer: Self-pay

## 2019-06-09 VITALS — BP 110/63 | HR 89 | Temp 98.7°F | Resp 18 | Ht 62.0 in | Wt 138.7 lb

## 2019-06-09 DIAGNOSIS — R740 Nonspecific elevation of levels of transaminase and lactic acid dehydrogenase [LDH]: Secondary | ICD-10-CM

## 2019-06-09 DIAGNOSIS — R7401 Elevation of levels of liver transaminase levels: Secondary | ICD-10-CM

## 2019-06-09 DIAGNOSIS — D638 Anemia in other chronic diseases classified elsewhere: Secondary | ICD-10-CM | POA: Insufficient documentation

## 2019-06-09 DIAGNOSIS — Z23 Encounter for immunization: Secondary | ICD-10-CM | POA: Insufficient documentation

## 2019-06-09 DIAGNOSIS — K1231 Oral mucositis (ulcerative) due to antineoplastic therapy: Secondary | ICD-10-CM

## 2019-06-09 DIAGNOSIS — Z9221 Personal history of antineoplastic chemotherapy: Secondary | ICD-10-CM | POA: Diagnosis not present

## 2019-06-09 DIAGNOSIS — C321 Malignant neoplasm of supraglottis: Secondary | ICD-10-CM

## 2019-06-09 DIAGNOSIS — R05 Cough: Secondary | ICD-10-CM | POA: Insufficient documentation

## 2019-06-09 DIAGNOSIS — E039 Hypothyroidism, unspecified: Secondary | ICD-10-CM

## 2019-06-09 DIAGNOSIS — E871 Hypo-osmolality and hyponatremia: Secondary | ICD-10-CM

## 2019-06-09 DIAGNOSIS — J383 Other diseases of vocal cords: Secondary | ICD-10-CM | POA: Diagnosis not present

## 2019-06-09 DIAGNOSIS — E46 Unspecified protein-calorie malnutrition: Secondary | ICD-10-CM | POA: Diagnosis not present

## 2019-06-09 DIAGNOSIS — R7989 Other specified abnormal findings of blood chemistry: Secondary | ICD-10-CM

## 2019-06-09 DIAGNOSIS — Z930 Tracheostomy status: Secondary | ICD-10-CM

## 2019-06-09 DIAGNOSIS — J387 Other diseases of larynx: Secondary | ICD-10-CM | POA: Diagnosis not present

## 2019-06-09 DIAGNOSIS — T451X5A Adverse effect of antineoplastic and immunosuppressive drugs, initial encounter: Secondary | ICD-10-CM

## 2019-06-09 DIAGNOSIS — Z430 Encounter for attention to tracheostomy: Secondary | ICD-10-CM | POA: Diagnosis not present

## 2019-06-09 DIAGNOSIS — Z79899 Other long term (current) drug therapy: Secondary | ICD-10-CM | POA: Diagnosis not present

## 2019-06-09 DIAGNOSIS — G893 Neoplasm related pain (acute) (chronic): Secondary | ICD-10-CM | POA: Diagnosis not present

## 2019-06-09 DIAGNOSIS — D6481 Anemia due to antineoplastic chemotherapy: Secondary | ICD-10-CM

## 2019-06-09 DIAGNOSIS — Z95828 Presence of other vascular implants and grafts: Secondary | ICD-10-CM

## 2019-06-09 LAB — CBC WITH DIFFERENTIAL (CANCER CENTER ONLY)
Abs Immature Granulocytes: 0.05 10*3/uL (ref 0.00–0.07)
Basophils Absolute: 0 10*3/uL (ref 0.0–0.1)
Basophils Relative: 0 %
Eosinophils Absolute: 0.1 10*3/uL (ref 0.0–0.5)
Eosinophils Relative: 1 %
HCT: 24.2 % — ABNORMAL LOW (ref 36.0–46.0)
Hemoglobin: 7.4 g/dL — ABNORMAL LOW (ref 12.0–15.0)
Immature Granulocytes: 1 %
Lymphocytes Relative: 8 %
Lymphs Abs: 0.8 10*3/uL (ref 0.7–4.0)
MCH: 26.4 pg (ref 26.0–34.0)
MCHC: 30.6 g/dL (ref 30.0–36.0)
MCV: 86.4 fL (ref 80.0–100.0)
Monocytes Absolute: 0.9 10*3/uL (ref 0.1–1.0)
Monocytes Relative: 10 %
Neutro Abs: 7.3 10*3/uL (ref 1.7–7.7)
Neutrophils Relative %: 80 %
Platelet Count: 368 10*3/uL (ref 150–400)
RBC: 2.8 MIL/uL — ABNORMAL LOW (ref 3.87–5.11)
RDW: 17.2 % — ABNORMAL HIGH (ref 11.5–15.5)
WBC Count: 9.2 10*3/uL (ref 4.0–10.5)
nRBC: 0 % (ref 0.0–0.2)

## 2019-06-09 LAB — CMP (CANCER CENTER ONLY)
ALT: 61 U/L — ABNORMAL HIGH (ref 0–44)
AST: 42 U/L — ABNORMAL HIGH (ref 15–41)
Albumin: 2.5 g/dL — ABNORMAL LOW (ref 3.5–5.0)
Alkaline Phosphatase: 130 U/L — ABNORMAL HIGH (ref 38–126)
Anion gap: 9 (ref 5–15)
BUN: 17 mg/dL (ref 8–23)
CO2: 27 mmol/L (ref 22–32)
Calcium: 9 mg/dL (ref 8.9–10.3)
Chloride: 96 mmol/L — ABNORMAL LOW (ref 98–111)
Creatinine: 0.72 mg/dL (ref 0.44–1.00)
GFR, Est AFR Am: >60 mL/min (ref >60)
GFR, Estimated: >60 mL/min (ref >60)
Glucose, Bld: 115 mg/dL — ABNORMAL HIGH (ref 70–99)
Potassium: 4.4 mmol/L (ref 3.5–5.1)
Sodium: 132 mmol/L — ABNORMAL LOW (ref 135–145)
Total Bilirubin: 0.3 mg/dL (ref 0.3–1.2)
Total Protein: 7.4 g/dL (ref 6.5–8.1)

## 2019-06-09 LAB — T4, FREE: Free T4: 1.42 ng/dL — ABNORMAL HIGH (ref 0.61–1.12)

## 2019-06-09 LAB — TSH: TSH: 2.445 u[IU]/mL (ref 0.308–3.960)

## 2019-06-09 MED ORDER — INFLUENZA VAC A&B SA ADJ QUAD 0.5 ML IM PRSY
PREFILLED_SYRINGE | INTRAMUSCULAR | Status: AC
  Filled 2019-06-09: qty 0.5

## 2019-06-09 MED ORDER — INFLUENZA VAC A&B SA ADJ QUAD 0.5 ML IM PRSY
0.5000 mL | PREFILLED_SYRINGE | INTRAMUSCULAR | Status: AC
  Administered 2019-06-09: 11:00:00 0.5 mL via INTRAMUSCULAR

## 2019-06-09 MED ORDER — HEPARIN SOD (PORK) LOCK FLUSH 100 UNIT/ML IV SOLN
500.0000 [IU] | Freq: Once | INTRAVENOUS | Status: AC | PRN
  Administered 2019-06-09: 500 [IU]
  Filled 2019-06-09: qty 5

## 2019-06-09 MED ORDER — SODIUM CHLORIDE 0.9% FLUSH
10.0000 mL | INTRAVENOUS | Status: DC | PRN
  Administered 2019-06-09: 10:00:00 10 mL
  Filled 2019-06-09: qty 10

## 2019-06-09 NOTE — Telephone Encounter (Signed)
Oncology Nurse Navigator Documentation  Spoke with Angie, ENT [facility] office, indicated Ms. Jonsson needs urgent appt to review results of 9/10 CT Neck with potential referral to [facility] or [facility] for total laryngectomy.  Angie informed she can be seen tomorrow or Friday, will call her to arrange appt.  Dr. Maylon Peppers provided update.  Gayleen Orem, RN, BSN Head & Neck Oncology Nurse [place] at [place] [phone]

## 2019-06-09 NOTE — Progress Notes (Signed)
[place] OFFICE PROGRESS NOTE  Patient Care Team: Philmore Pali, NP as PCP - General (Nurse Practitioner) Eppie Gibson, MD as Attending Physician (Radiation Oncology) Tish Men, MD as Consulting Physician (Hematology) Leota Sauers, RN as Oncology Nurse Navigator Karie Mainland, RD as Dietitian (Nutrition) Sharen Counter, CCC-SLP as Speech Language Pathologist (Speech Pathology)  HEME/ONC OVERVIEW: 1.  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 -11/2018:   3.1 x 2.8cm left supraglottic laryngeal mass extending across the anterior commissure and invading the prelaryngeal fat and suspicious <1cm bilateral Level II cervical LN's on CT  Transferred to [facility] for airway compromise and taken to OR for tracheostomy; multiple bx'es I have -12/2018: repeat DL with bx of the supraglottic mass showed well-differentiated invasive squamous cell carcinoma  -01/2019: PET showed locally advanced disease, including bilateral cervical LN involvement; mildly FDG-avid mediastinal LN's, bx negative for malignancy -Mid-01/2019 - 03/2019: definitive chemoradiation with weekly cisplatin x 7 doses -05/2019:   Laryngoscopy suspicious for locally recurrent/residual disease   Bulky laryngeal tumor difficult to evaluate due to edema from radiation on CT, but overall improving appearance; no definite progressive disease   2. 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  There was no definite or progressive disease.  There was a prominent left paratracheal lymph node on CT chest, which was benign on biopsy.  -I discussed the imaging results in detail with the patient -Due to the concern about local disease persistence/recurrence based on the laryngoscopy by Dr. Erik Obey in late 29/5284 I have requested the patient to be evaluated by ENT at [facility] to confirm recurrent/residual disease and to discuss salvage laryngectomy -Unfortunately, patient still has not yet been set up for follow-up at [facility]; I have contacted the H&N navigator to expedite her appointment there ASAP  -Pending [facility] evaluation, we will determine the next step of plan  Normocytic anemia -Likely multifactorial, including recent chemotherapy, dilution, and iron deficiency -Hgb 7.4, persistently low; iron profile showed mixed iron deficiency and anemia of chronic disease -Patient denies any symptom of bleeding; she drinks ~100 oz of fluid per day  -We discussed some of the risks, benefits, and alternatives of intravenous iron infusions.  -The patient's Hgb and the iron level are very low; as such, oral supplement is not sufficient to replete iron storage quickly and she will need IV iron to higher levels of iron faster for adequate hematopoesis.  -Some of the side-effects to be expected including risks of infusion reactions, phlebitis, headaches, nausea and fatigue.   -The patient is willing to proceed.  -Goal is to keep ferritin level greater than 50.  Transaminitis -LFT's elevated (AST 42, ALT 61, ALK 130), new; Tbili normal -Patient denies any recent medication change -I have ordered abdominal US to assess for any liver abnormality -  I also instructed the patient to avoid any OTC Tylenol  -We will monitor it  closely   Hyponatremia  -Likely due to dilution and decreased salt intake  -Na 132, modestly improving  -I instructed the patient to add some chicken broth via the feeding tube, and continue Pedialyte and water  -We will monitor it for now    Chemoradiation-associated mucositis -Secondary to chemoradiation  -Currently on liquid IR morphine 64m/0.5mL q4hrs PRN (taking approximately 1x/day) -Pain well controlled  -Continue the regimen for now   Protein malnutrition -Secondary to chemoradiation -Weight down by 2 lbs since last visit -Currently taking 3-4 cans of KPepsiCovia the feeding tube as well as several bottles of water mixed with Pedialyte via feeding tube and by mouth -I encouraged patient to adhere to nutritional recommendations, and as her mucositis continues to improve, she may also resume oral intake as tolerated   Hypothyroidism -Currently on Synthroid 1749m -Continue follow-up with her endocrinologist   Orders Placed This Encounter  Procedures  . USKoreabdomen Complete    Standing Status:   Future    Standing Expiration Date:   06/08/2020    Order Specific Question:   Reason for Exam (SYMPTOM  OR DIAGNOSIS REQUIRED)    Answer:   new transaminitis    Order Specific Question:   Preferred imaging location?    [facility]:   [facility]. CBC with Differential ([place])    Standing Status:   Future    Standing Expiration Date:   07/13/2020  . CMP ([place])    Standing Status:   Future    Standing Expiration Date:   07/13/2020  . TSH    Standing Status:   Future    Standing Expiration Date:   06/08/2020  . T4, free    Standing Status:   Future    Standing Expiration Date:   06/08/2020   All questions were answered. The patient knows to call the clinic with any problems, questions or concerns. No barriers to learning was detected.  Return in ~6 weeks for labs, port flush and clinic appt, [facility]  YaTish Men MD 06/09/2019 11:42 AM  CHIEF COMPLAINT: "I am okay"  INTERVAL HISTORY: Mr. FaBentlyeturns to clinic for follow-up of squamous cell carcinoma of the supraglottic larynx s/p definitive chemoradiation.  Patient reports that she still has periodic cough with yellow/green mucus from the tracheostomy tube, which is unchanged.  She has not had any fever or hemoptysis.  She still has some pain with swallowing, for which she takes IR liquid morphine approximately once a day with adequate pain control.  She is currently taking 3 and a half cans of KaPepsiCoube feeding and approximately 16 ounce of Pedialyte via the feeding tube.  In addition, she is able to drink some boost and water by mouth, but she has not yet resumed any food intake.  Her endocrinologist recently increase her Synthroid dose to 175 mcg daily.  [facility]  She denies any other complaint today.  SUMMARY OF ONCOLOGIC HISTORY: Oncology History  Malignant neoplasm of supraglottis ([facility]CA[place] 12/02/2018 Imaging   CT neck: IMPRESSION: 1. Supraglottic laryngeal carcinoma extending across the anterior commissure and invading the prelaryngeal fat, left aryepiglottic fold and left aspect of the epiglottis. 2. Severe narrowing of the laryngeal airway. 3. Bilateral subcentimeter level 2A and 2A cervical lymph nodes.  Critical Value/emergent results  were called by telephone at the time of interpretation on 12/02/2018 at 5:34 pm to Dr. Collier Salina, who verbally acknowledged these results.   12/02/2018 Imaging   CT  Chest:  IMPRESSION: No evidence of metastatic disease in the chest.   12/03/2018 Pathology Results   (Care Everywhere; [facility]) A: Larynx, supraglottis, biopsy - Atypical squamous proliferation with at least low grade dysplasia and abundant keratinization - No definite high grade dysplasia or invasive carcinoma represented (see comment)   12/18/2018 Pathology Results   (Care Everywhere; [facility]) A:  Neck, left, fine needle aspiration - Rare single atypical cells, no cells diagnostic of malignancy - Lymph node material present  B:  Neck, right, fine needle aspiration - Non-diagnostic specimen - No lymph node material identified - Mixed inflammation and blood   12/31/2018 Pathology Results   (Care Everywhere; [facility])  A: Larynx, left supraglottic mass, biopsy - Atypical squamous proliferation with abundant keratinization, suspicious for well-differentiated invasive squamous cell carcinoma  B: Larynx, left supraglottic mass, biopsy - Well-differentiated invasive squamous cell carcinoma with abundant keratinization (see comment)   01/18/2019 Initial Diagnosis   Laryngeal cancer ([place])   01/26/2019 Imaging   PET: IMPRESSION: 1. Locally advanced hypermetabolic laryngeal neoplasm centered in the left supraglottic space with bilateral glottic and epiglottic involvement and subglottic extension on the left. 2. Hypermetabolic bilateral level 3 neck nodal metastases. 3. Hypermetabolic high mediastinal nodal metastases between the trachea and upper thoracic esophagus. 4. Otherwise no distant hypermetabolic metastatic disease. 5. Nodular 7 mm focus at the left lung base, below PET resolution, recommend attention on follow-up chest CT in 3 months. 6. Chronic findings include: Aortic Atherosclerosis (ICD10-I70.0). Cholelithiasis.   02/08/2019 Cancer Staging   Staging form: Larynx - Supraglottis, AJCC 8th Edition - Clinical stage from 02/08/2019: Stage IVA (cT3, cN2c, cM0) - Signed by Eppie Gibson, MD on 02/08/2019   02/11/2019 - 04/07/2019 Chemotherapy   The patient had palonosetron (ALOXI) injection 0.25 mg, 0.25 mg, Intravenous,  Once, 7 of 7 cycles Administration: 0.25 mg (02/11/2019), 0.25 mg (02/18/2019), 0.25 mg (03/04/2019), 0.25 mg (03/11/2019), 0.25 mg (03/18/2019), 0.25 mg (03/25/2019), 0.25 mg (04/01/2019) CISplatin (PLATINOL) 72 mg in sodium chloride 0.9 % 250 mL chemo  infusion, 40 mg/m2 = 72 mg, Intravenous,  Once, 7 of 7 cycles Administration: 72 mg (02/11/2019), 72 mg (02/18/2019), 72 mg (03/04/2019), 72 mg (03/11/2019), 72 mg (03/18/2019), 72 mg (03/25/2019), 72 mg (04/01/2019) fosaprepitant (EMEND) 150 mg, dexamethasone (DECADRON) 12 mg in sodium chloride 0.9 % 145 mL IVPB, , Intravenous,  Once, 7 of 7 cycles Administration:  (02/11/2019),  (02/18/2019),  (03/04/2019),  (03/11/2019),  (03/18/2019),  (03/25/2019),  (04/01/2019)  for chemotherapy treatment.      REVIEW OF SYSTEMS:   Constitutional: ( - ) fevers, ( - )  chills , ( - ) night sweats Eyes: ( - ) blurriness of vision, ( - ) double vision, ( - ) watery eyes Ears, nose, mouth, throat, and face: ( + ) mucositis, ( + ) sore throat Respiratory: ( + ) cough, ( - ) dyspnea, ( - ) wheezes Cardiovascular: ( - ) palpitation, ( - ) chest discomfort, ( - ) lower extremity swelling Gastrointestinal:  ( - ) nausea, ( - ) heartburn, ( - ) change in bowel habits Skin: ( - ) abnormal skin rashes Lymphatics: ( - ) new lymphadenopathy, ( - ) easy bruising Neurological: ( - ) numbness, ( - ) tingling, ( - ) new weaknesses Behavioral/Psych: ( - ) mood  change, ( - ) new changes  All other systems were reviewed with the patient and are negative.  I have reviewed the past medical history, past surgical history, social history and family history with the patient and they are unchanged from previous note.  ALLERGIES:  has No Known Allergies.  MEDICATIONS:  Current Outpatient Medications  Medication Sig Dispense Refill  . chlorhexidine (PERIDEX) 0.12 % solution Rinse with 15 mls twice daily for 30 seconds. Use after breakfast and at bedtime. Spit out excess. Do not swallow. (Patient taking differently: Use as directed 15 mLs in the mouth or throat 2 (two) times daily. Rinse with 15 mls twice daily for 30 seconds. Use after breakfast and at bedtime. Spit out excess. Do not swallow.) 480 mL prn  . docusate sodium (COLACE) 100 MG  capsule Take 100 mg by mouth 2 (two) times daily.    Marland Kitchen levothyroxine (SYNTHROID) 175 MCG tablet Take 175 mcg by mouth every other day. Alternating with 150 mcg dose    . lidocaine (XYLOCAINE) 2 % solution Patient: Mix 1part 2% viscous lidocaine, 1part H20. Swallow 57m of diluted mixture, 355m before meals and at bedtime, up to QID prn soreness 100 mL 5  . LORazepam (ATIVAN) 0.5 MG tablet Take 1 tablet (0.5 mg total) by mouth every 6 (six) hours as needed (Nausea or vomiting). 30 tablet 0  . Nutritional Supplements (KATE FARMS PEPTIDE 1.5) LIQD Take 4 Bottles by mouth daily. 325 mL 6  . sodium fluoride (PREVIDENT 5000 PLUS) 1.1 % CREA dental cream Apply cream to tooth brush. Brush teeth for 2 minutes. Spit out excess. DO NOT rinse afterwards. Repeat nightly. (Patient taking differently: Place 1 application onto teeth 2 (two) times a day. ) 1 Tube prn   No current facility-administered medications for this visit.     PHYSICAL EXAMINATION: ECOG PERFORMANCE STATUS: 1 - Symptomatic but completely ambulatory  Today's Vitals   06/09/19 1050 06/09/19 1051  BP: 110/63   Pulse: 89   Resp: 18   Temp: 98.7 F (37.1 C)   TempSrc: Oral   SpO2: 99%   Weight: 138 lb 11.2 oz (62.9 kg)   Height: 5' 2"  (1.575 m)   PainSc:  3    Body mass index is 25.37 kg/m.  Filed Weights   06/09/19 1050  Weight: 138 lb 11.2 oz (62.9 kg)    GENERAL: alert, no distress and comfortable SKIN: skin color, texture, turgor are normal, no rashes or significant lesions EYES: conjunctiva are pink and non-injected, sclera clear OROPHARYNX: no exudate, no erythema; lips, buccal mucosa, and tongue normal  NECK: supple, non-tender, tracheostomy in place  LYMPH:  no palpable lymphadenopathy in the cervical LUNGS: clear to auscultation with normal breathing effort HEART: regular rate & rhythm and no murmurs and no lower extremity edema ABDOMEN: soft, non-tender, non-distended, normal bowel sounds Musculoskeletal: no  cyanosis of digits and no clubbing  PSYCH: alert & oriented x 3, whispering due to tracheostomy  NEURO: no focal motor/sensory deficits  LABORATORY DATA:  I have reviewed the data as listed    Component Value Date/Time   NA 132 (L) 06/09/2019 1035   K 4.4 06/09/2019 1035   CL 96 (L) 06/09/2019 1035   CO2 27 06/09/2019 1035   GLUCOSE 115 (H) 06/09/2019 1035   BUN 17 06/09/2019 1035   CREATININE 0.72 06/09/2019 1035   CALCIUM 9.0 06/09/2019 1035   PROT 7.4 06/09/2019 1035   ALBUMIN 2.5 (L) 06/09/2019 1035   AST 42 (H)  06/09/2019 1035   ALT 61 (H) 06/09/2019 1035   ALKPHOS 130 (H) 06/09/2019 1035   BILITOT 0.3 06/09/2019 1035   GFRNONAA >60 06/09/2019 1035   GFRAA >60 06/09/2019 1035    No results found for: SPEP, UPEP  Lab Results  Component Value Date   WBC 9.2 06/09/2019   NEUTROABS 7.3 06/09/2019   HGB 7.4 (L) 06/09/2019   HCT 24.2 (L) 06/09/2019   MCV 86.4 06/09/2019   PLT 368 06/09/2019      Chemistry      Component Value Date/Time   NA 132 (L) 06/09/2019 1035   K 4.4 06/09/2019 1035   CL 96 (L) 06/09/2019 1035   CO2 27 06/09/2019 1035   BUN 17 06/09/2019 1035   CREATININE 0.72 06/09/2019 1035      Component Value Date/Time   CALCIUM 9.0 06/09/2019 1035   ALKPHOS 130 (H) 06/09/2019 1035   AST 42 (H) 06/09/2019 1035   ALT 61 (H) 06/09/2019 1035   BILITOT 0.3 06/09/2019 1035       RADIOGRAPHIC STUDIES: I have personally reviewed the radiological images as listed below and agreed with the findings in the report. Ct Soft Tissue Neck W Contrast  Result Date: 06/04/2019 CLINICAL DATA:  Laryngeal cancer follow-up. Radiation and chemotherapy treatment EXAM: CT NECK WITH CONTRAST TECHNIQUE: Multidetector CT imaging of the neck was performed using the standard protocol following the bolus administration of intravenous contrast. CONTRAST:  154m ISOVUE-300 IOPAMIDOL (ISOVUE-300) INJECTION 61% COMPARISON:  CT neck 12/02/2018 FINDINGS: Pharynx and larynx: Interval  tracheostomy. Bulky laryngeal tumor on the left is now obscured by post radiation edema and hypopharyngeal secretions. It is difficult to determine the extent of residual tumor. Tracheostomy in good position. Salivary glands: No inflammation, mass, or stone. Thyroid: Negative Lymph nodes: Left level 3 node just below the hyoid bone measures 5 mm, previously 9 mm. Right supraclavicular node 7 mm previously 4 mm. Diffuse soft tissue edema from radiation makes lymph node evaluation difficult Vascular: Normal vascular enhancement Limited intracranial: Negative Visualized orbits: Negative Mastoids and visualized paranasal sinuses: Negative Skeleton: Cervical spondylosis and facet degeneration. No acute abnormality. Upper chest: Lung apices clear bilaterally Other: Anterior soft tissue edema of the neck compatible with radiation change. IMPRESSION: 1. Bulky tumor left larynx difficult to evaluate due to post radiation edema and secretions in the hypopharynx. Overall tumor bulk appears improved. 2. Interval tracheostomy. 3. No pathologic lymph nodes seen on the prior study however left level 3 node is smaller and right supraclavicular node 7 mm is larger. Continued follow-up recommended. 4. Post radiation edema in the neck. Electronically Signed   By: CFranchot GalloM.D.   On: 06/04/2019 09:27   Ct Chest W Contrast  Result Date: 06/04/2019 CLINICAL DATA:  Supraglottic laryngeal carcinoma. Status post chemo radiation. EXAM: CT CHEST WITH CONTRAST TECHNIQUE: Multidetector CT imaging of the chest was performed during intravenous contrast administration. CONTRAST:  1043mISOVUE-300 IOPAMIDOL (ISOVUE-300) INJECTION 61% COMPARISON[facility]  [facility] FIND[facility]: Cardiovascular: No significant vascular findings. Normal heart size. No pericardial effusion. Mediastinum/Nodes: No axillary or supraclavicular adenopathy. Tracheostomy tube in place. Soft tissue filling of the larynx and supraglottic space similar to  prior. LEFT paratracheal lymph node measuring 8 mm (image 43/4) compared to 6 mm on CT 12/05/2018. No hilar adenopathy. Lungs/Pleura: No suspicious nodularity.  Airways normal. Upper Abdomen: Percutaneous gastrostomy tube in the stomach. Adrenal glands normal. No upper abdominal adenopathy. Gallstone noted. Musculoskeletal: No aggressive osseous lesion. Degenerative osteophytosis of the  spine. Hemangioma in the T7 vertebral body. IMPRESSION: 1. Fullness in supraglottic tissues not changed from comparison exam. 2. A prominent LEFT paratracheal lymph node adjacent to the aortic arch not pathologic by size criteria but measures slightly larger. Some variance in technique. Recommend attention on follow-up. 3. No suspicious pulmonary nodules. Electronically Signed   By: Suzy Bouchard M.D.   On: 06/04/2019 08:32

## 2019-06-09 NOTE — Telephone Encounter (Signed)
Gave avs and calendar ° °

## 2019-06-10 DIAGNOSIS — C321 Malignant neoplasm of supraglottis: Secondary | ICD-10-CM | POA: Diagnosis not present

## 2019-06-10 DIAGNOSIS — R633 Feeding difficulties: Secondary | ICD-10-CM | POA: Diagnosis not present

## 2019-06-10 NOTE — Telephone Encounter (Signed)
Thanks for the update. Please keep me posted of ENT evaluation.  Dr. Maylon Peppers

## 2019-06-11 ENCOUNTER — Other Ambulatory Visit: Payer: Self-pay | Admitting: Radiation Oncology

## 2019-06-11 DIAGNOSIS — C321 Malignant neoplasm of supraglottis: Secondary | ICD-10-CM

## 2019-06-14 ENCOUNTER — Telehealth: Payer: Self-pay | Admitting: *Deleted

## 2019-06-14 ENCOUNTER — Telehealth: Payer: Self-pay | Admitting: Hematology

## 2019-06-14 ENCOUNTER — Other Ambulatory Visit: Payer: Self-pay | Admitting: Hematology

## 2019-06-14 NOTE — Telephone Encounter (Signed)
Oncology Nurse Navigator Documentation  Received call from [place], ENT Dr. Noreene Filbert office.  I informed her Dr. Isidore Moos, Radiation Oncology, placed patient's referral to [facility] last week Friday in follow-up to IB exchanges with Dr. Erik Obey.  She voiced understanding.  Gayleen Orem, RN, BSN Head & Neck Oncology Nurse [place] at [place] [phone]

## 2019-06-14 NOTE — Telephone Encounter (Signed)
Scheduled appt per 9/21 sch message - pt aware of appt date and time

## 2019-06-16 ENCOUNTER — Telehealth: Payer: Self-pay | Admitting: *Deleted

## 2019-06-16 NOTE — Telephone Encounter (Signed)
Spoke with roommate [name], and informed her that due to insurance approval, pt will be receiving Iron Infusion weekly x 4.   [name] voiced understanding, and aware of pt's first appt is  06/17/19.

## 2019-06-17 ENCOUNTER — Telehealth: Payer: Self-pay | Admitting: *Deleted

## 2019-06-17 ENCOUNTER — Inpatient Hospital Stay: Payer: Medicare HMO

## 2019-06-17 ENCOUNTER — Other Ambulatory Visit: Payer: Self-pay

## 2019-06-17 ENCOUNTER — Ambulatory Visit (HOSPITAL_COMMUNITY)
Admission: RE | Admit: 2019-06-17 | Discharge: 2019-06-17 | Disposition: A | Discharge status: Home / Self Care | Payer: Medicare HMO | Source: Ambulatory Visit | Attending: Hematology | Admitting: Hematology

## 2019-06-17 VITALS — BP 107/61 | HR 80 | Temp 98.7°F

## 2019-06-17 DIAGNOSIS — D638 Anemia in other chronic diseases classified elsewhere: Secondary | ICD-10-CM | POA: Diagnosis not present

## 2019-06-17 DIAGNOSIS — E46 Unspecified protein-calorie malnutrition: Secondary | ICD-10-CM | POA: Diagnosis not present

## 2019-06-17 DIAGNOSIS — R7989 Other specified abnormal findings of blood chemistry: Secondary | ICD-10-CM | POA: Diagnosis not present

## 2019-06-17 DIAGNOSIS — R740 Nonspecific elevation of levels of transaminase and lactic acid dehydrogenase [LDH]: Secondary | ICD-10-CM | POA: Insufficient documentation

## 2019-06-17 DIAGNOSIS — C321 Malignant neoplasm of supraglottis: Secondary | ICD-10-CM | POA: Diagnosis not present

## 2019-06-17 DIAGNOSIS — E039 Hypothyroidism, unspecified: Secondary | ICD-10-CM | POA: Diagnosis not present

## 2019-06-17 DIAGNOSIS — R7401 Elevation of levels of liver transaminase levels: Secondary | ICD-10-CM

## 2019-06-17 DIAGNOSIS — Z79899 Other long term (current) drug therapy: Secondary | ICD-10-CM | POA: Diagnosis not present

## 2019-06-17 DIAGNOSIS — K8020 Calculus of gallbladder without cholecystitis without obstruction: Secondary | ICD-10-CM | POA: Diagnosis not present

## 2019-06-17 DIAGNOSIS — Z95828 Presence of other vascular implants and grafts: Secondary | ICD-10-CM

## 2019-06-17 DIAGNOSIS — G893 Neoplasm related pain (acute) (chronic): Secondary | ICD-10-CM | POA: Diagnosis not present

## 2019-06-17 DIAGNOSIS — E871 Hypo-osmolality and hyponatremia: Secondary | ICD-10-CM | POA: Diagnosis not present

## 2019-06-17 DIAGNOSIS — R05 Cough: Secondary | ICD-10-CM | POA: Diagnosis not present

## 2019-06-17 IMAGING — US US ABDOMEN COMPLETE
1 series · 14 of 25 positions shown · non-contrast
Comparison: None.

CLINICAL DATA: Transaminitis.  History of laryngeal carcinoma.

EXAM:
ABDOMEN ULTRASOUND COMPLETE

[Series 1: us abdomen complete · 14 of 105 slices shown]
[im 1/105]
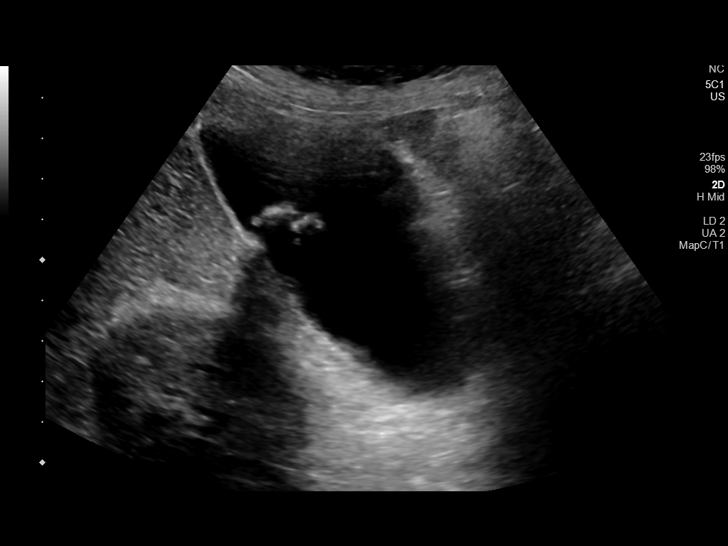
[im 9/105]
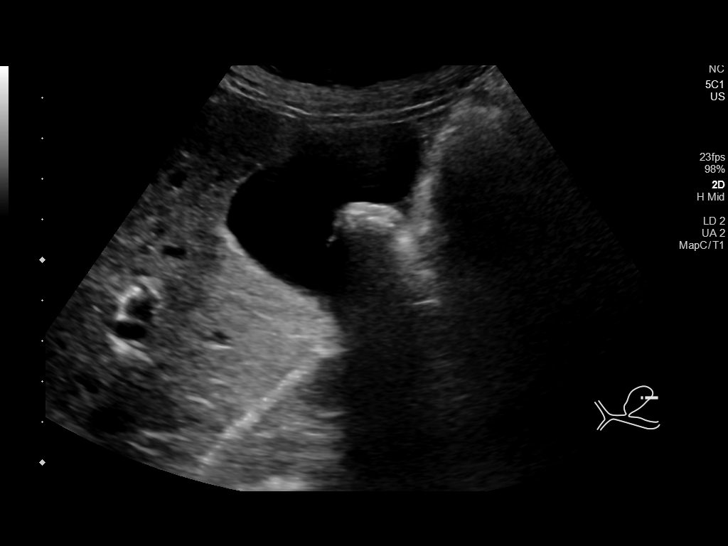
[im 18/105]
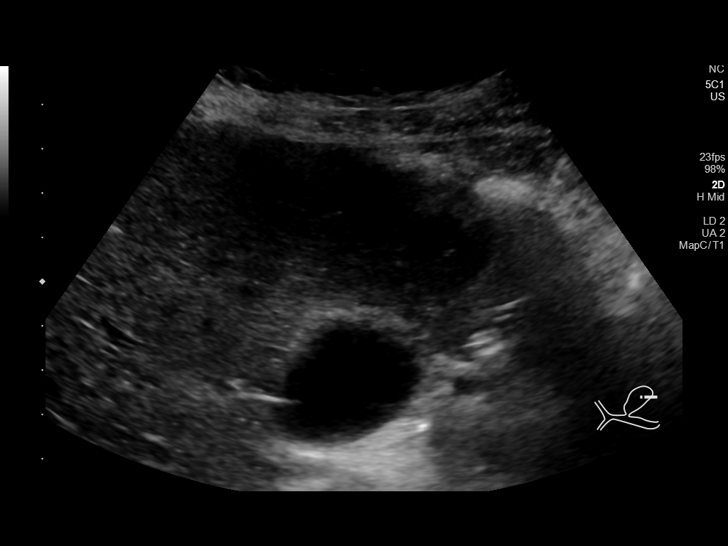
[im 27/105]
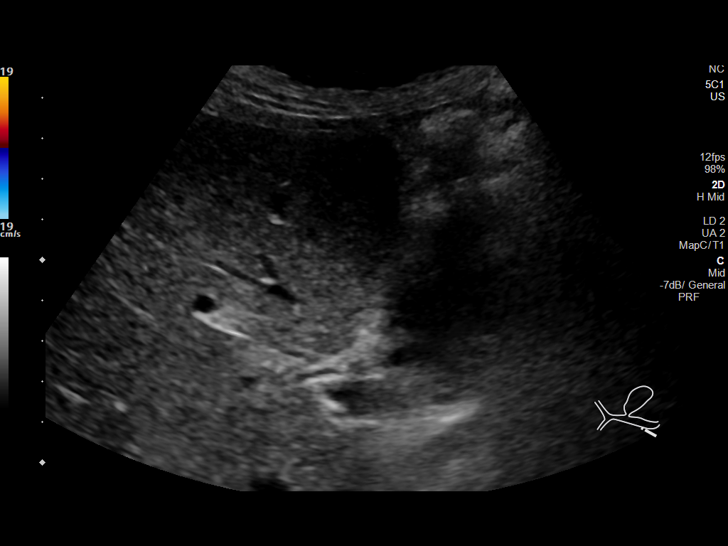
[im 35/105]
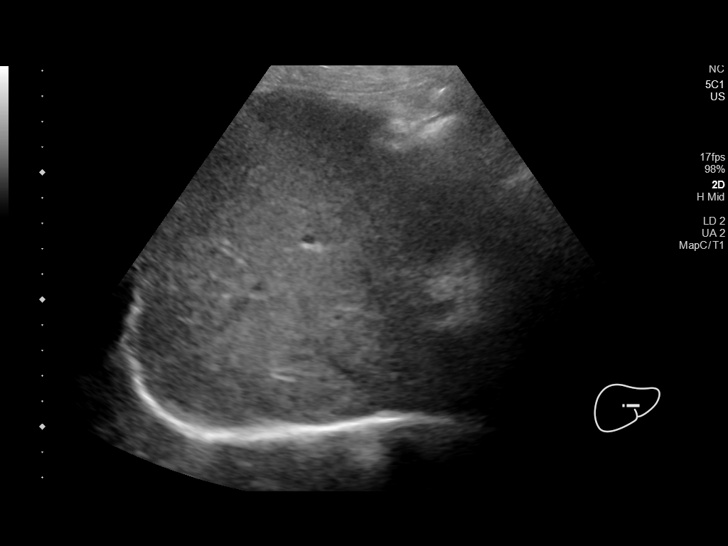
[im 40/105]
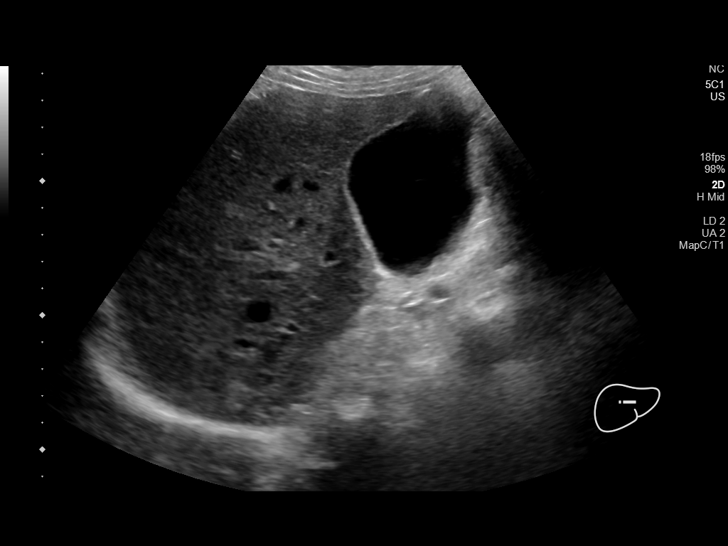
[im 48/105]
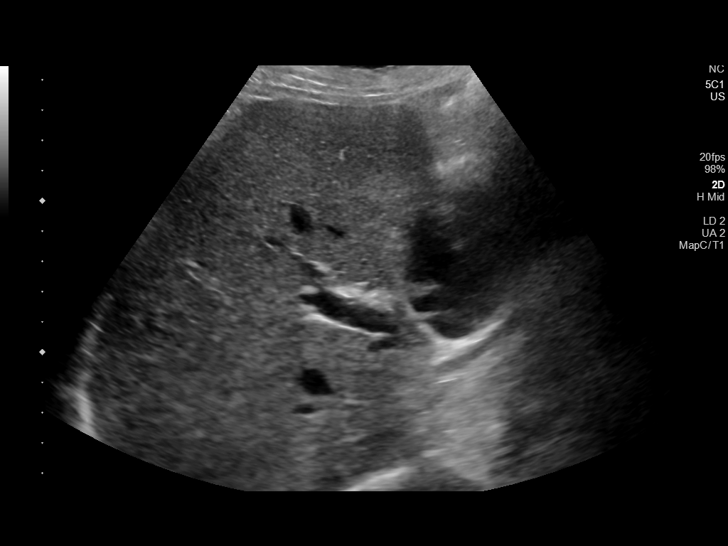
[im 57/105]
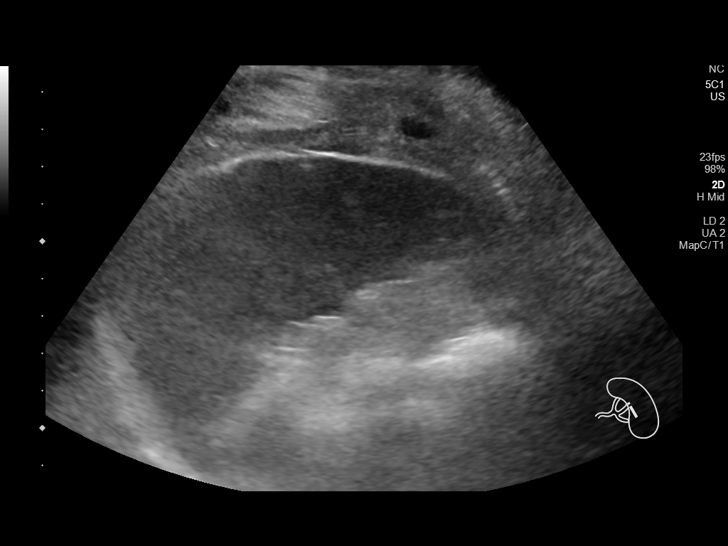
[im 66/105]
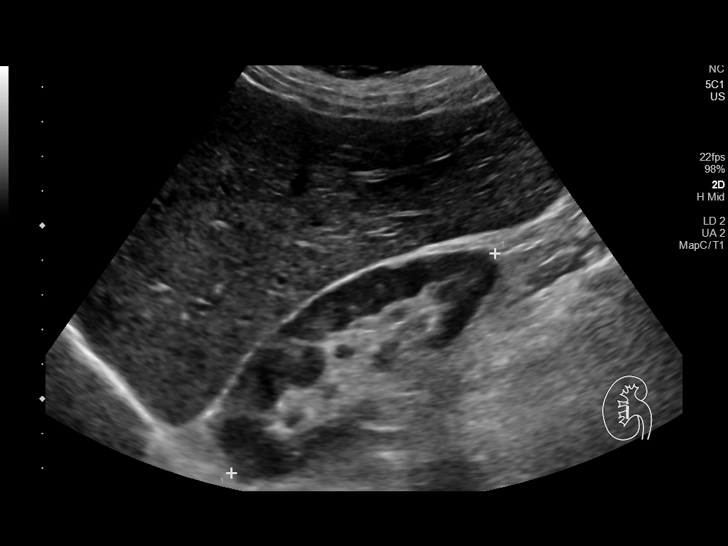
[im 70/105]
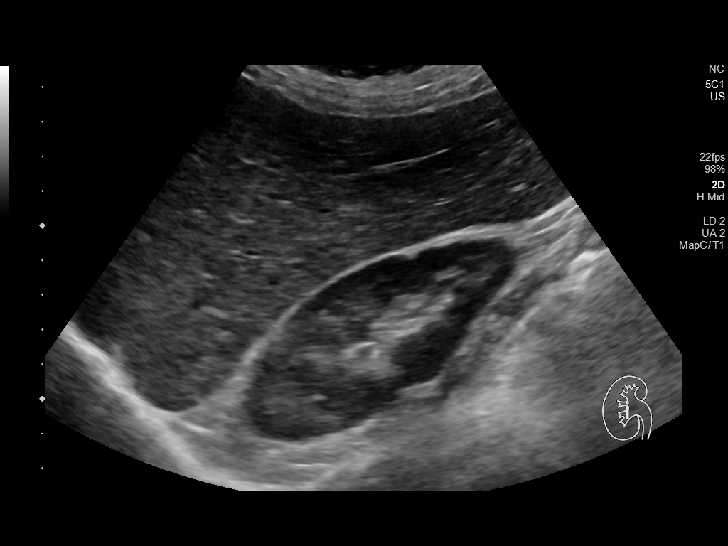
[im 79/105]
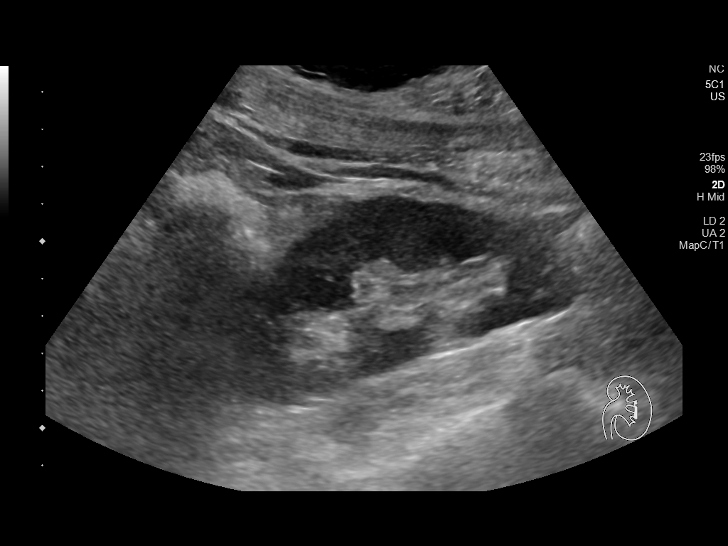
[im 87/105]
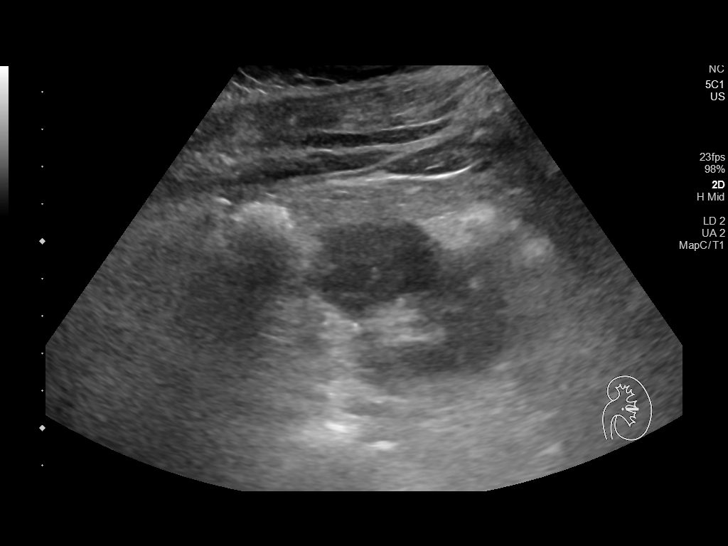
[im 96/105]
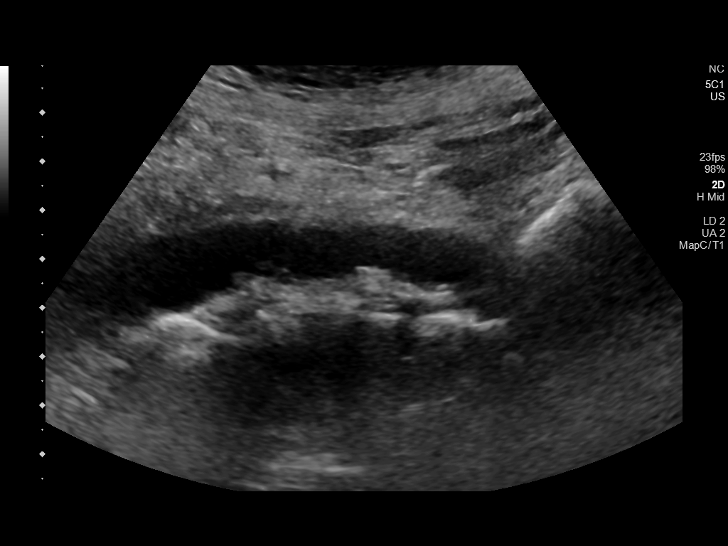
[im 105/105]
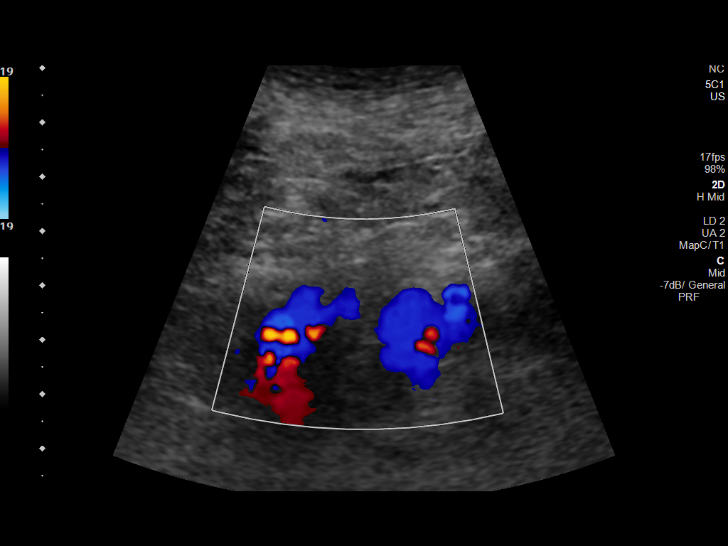

[14 of 25 positions shown; findings below may reference images not displayed]

FINDINGS: Gallbladder: Shadowing stones, largest measuring 2.1 cm. No wall
thickening. No pericholecystic fluid.

Common bile duct: Diameter: 2 mm

Liver: No focal lesion identified. Within normal limits in
parenchymal echogenicity. Portal vein is patent on color Doppler
imaging with normal direction of blood flow towards the liver.

IVC: No abnormality visualized.

Pancreas: Not visualized.

Spleen: Size and appearance within normal limits.

Right Kidney: Length: 9.9 cm. Echogenicity within normal limits. No
mass or hydronephrosis visualized.

Left Kidney: Length: 9.9 cm. Echogenicity within normal limits. No
mass or hydronephrosis visualized.

Abdominal aorta: Diffuse irregular atherosclerosis.  No aneurysm.

Other findings: None.
IMPRESSION: 1. No acute findings.  No sonographic abnormality of the liver.
2. Cholelithiasis with no evidence of acute cholecystitis. No bile
duct dilation.
3. Aortic atherosclerosis.

## 2019-06-17 MED ORDER — SODIUM CHLORIDE 0.9% FLUSH
10.0000 mL | Freq: Once | INTRAVENOUS | Status: AC | PRN
  Administered 2019-06-17: 10 mL
  Filled 2019-06-17: qty 10

## 2019-06-17 MED ORDER — SODIUM CHLORIDE 0.9 % IV SOLN
200.0000 mg | Freq: Once | INTRAVENOUS | Status: AC
  Administered 2019-06-17: 200 mg via INTRAVENOUS
  Filled 2019-06-17: qty 10

## 2019-06-17 MED ORDER — HEPARIN SOD (PORK) LOCK FLUSH 100 UNIT/ML IV SOLN
500.0000 [IU] | Freq: Once | INTRAVENOUS | Status: AC | PRN
  Administered 2019-06-17: 500 [IU]
  Filled 2019-06-17: qty 5

## 2019-06-17 MED ORDER — SODIUM CHLORIDE 0.9 % IV SOLN
Freq: Once | INTRAVENOUS | Status: AC
  Administered 2019-06-17: 09:00:00 via INTRAVENOUS
  Filled 2019-06-17: qty 250

## 2019-06-17 NOTE — Telephone Encounter (Signed)
Oncology Nurse Navigator Documentation  Spoke with pt's partner, [name], who indicated she has not been contacted by [facility] for appt.  She again questioned necessity for referral despite my effort to explain.  She further noted [facility] not necessarily preferred for referral, [facility] an acceptable alternative.  I indicaed I would ask Dr. Erasmo Downer to call her to better explain purpose of referral.  IB sent to Drs. Maylon Peppers and Isidore Moos requesting call to pt/partner.  Gayleen Orem, RN, BSN Head & Neck Oncology Nurse [place] at [place] [phone]

## 2019-06-17 NOTE — Patient Instructions (Signed)

## 2019-06-18 ENCOUNTER — Telehealth: Payer: Self-pay | Admitting: *Deleted

## 2019-06-18 ENCOUNTER — Other Ambulatory Visit: Payer: Self-pay | Admitting: Radiation Oncology

## 2019-06-18 DIAGNOSIS — C321 Malignant neoplasm of supraglottis: Secondary | ICD-10-CM

## 2019-06-18 NOTE — Telephone Encounter (Signed)
TCT pt's caregiver, [name]. Advised her of pt's normal LFT's. Advised to avoid excessive Tylenol d/t liver toxicities with excessive tylenol. [name] voiced understanding and she will relay the information to [place].

## 2019-06-18 NOTE — Telephone Encounter (Signed)
CALLED PATIENT TO INFORM OF APPT. WITH DR. Fredricka Bonine ON 06-23-19 - ARRIVAL TIME- 2:45 PM- ADDRESS- [address] [place], [place] [phone], SPOKE WITH [name] AND SHE IS AWARE OF THIS APPT.

## 2019-06-18 NOTE — Telephone Encounter (Signed)
-----   Message from Tish Men, MD sent at 06/17/2019  5:57 PM EDT ----- Jamas Lav,   Can we let Ms. Paster know that her LFT's were normal, and we will just keep an eye on it for now? She will need to avoid excess Tylenol intake.  Thanks.  [place]  ----- Message ----- From: Interface, Rad Results In Sent: 06/17/2019   4:58 PM EDT To: Tish Men, MD

## 2019-06-23 DIAGNOSIS — R69 Illness, unspecified: Secondary | ICD-10-CM | POA: Diagnosis not present

## 2019-06-23 DIAGNOSIS — Z8521 Personal history of malignant neoplasm of larynx: Secondary | ICD-10-CM | POA: Diagnosis not present

## 2019-06-23 DIAGNOSIS — D380 Neoplasm of uncertain behavior of larynx: Secondary | ICD-10-CM | POA: Diagnosis not present

## 2019-06-23 DIAGNOSIS — Z931 Gastrostomy status: Secondary | ICD-10-CM | POA: Diagnosis not present

## 2019-06-23 DIAGNOSIS — Z930 Tracheostomy status: Secondary | ICD-10-CM | POA: Diagnosis not present

## 2019-06-23 DIAGNOSIS — C329 Malignant neoplasm of larynx, unspecified: Secondary | ICD-10-CM | POA: Diagnosis not present

## 2019-06-23 DIAGNOSIS — Z923 Personal history of irradiation: Secondary | ICD-10-CM | POA: Diagnosis not present

## 2019-06-24 ENCOUNTER — Inpatient Hospital Stay: Payer: Medicare HMO | Admitting: Nutrition

## 2019-06-24 ENCOUNTER — Inpatient Hospital Stay: Payer: Medicare HMO | Attending: Hematology

## 2019-06-24 ENCOUNTER — Other Ambulatory Visit: Payer: Self-pay

## 2019-06-24 ENCOUNTER — Inpatient Hospital Stay (HOSPITAL_BASED_OUTPATIENT_CLINIC_OR_DEPARTMENT_OTHER): Payer: Medicare HMO | Admitting: Medical

## 2019-06-24 VITALS — BP 92/57 | HR 82 | Temp 98.7°F | Resp 24

## 2019-06-24 DIAGNOSIS — E039 Hypothyroidism, unspecified: Secondary | ICD-10-CM | POA: Diagnosis not present

## 2019-06-24 DIAGNOSIS — I959 Hypotension, unspecified: Secondary | ICD-10-CM

## 2019-06-24 DIAGNOSIS — C321 Malignant neoplasm of supraglottis: Secondary | ICD-10-CM | POA: Diagnosis present

## 2019-06-24 DIAGNOSIS — D6481 Anemia due to antineoplastic chemotherapy: Secondary | ICD-10-CM

## 2019-06-24 DIAGNOSIS — Z79899 Other long term (current) drug therapy: Secondary | ICD-10-CM | POA: Diagnosis not present

## 2019-06-24 DIAGNOSIS — E46 Unspecified protein-calorie malnutrition: Secondary | ICD-10-CM | POA: Insufficient documentation

## 2019-06-24 DIAGNOSIS — T451X5A Adverse effect of antineoplastic and immunosuppressive drugs, initial encounter: Secondary | ICD-10-CM | POA: Insufficient documentation

## 2019-06-24 DIAGNOSIS — Z95828 Presence of other vascular implants and grafts: Secondary | ICD-10-CM

## 2019-06-24 DIAGNOSIS — E871 Hypo-osmolality and hyponatremia: Secondary | ICD-10-CM | POA: Diagnosis not present

## 2019-06-24 MED ORDER — SODIUM CHLORIDE 0.9 % IV SOLN
200.0000 mg | Freq: Once | INTRAVENOUS | Status: AC
  Administered 2019-06-24: 200 mg via INTRAVENOUS
  Filled 2019-06-24: qty 10

## 2019-06-24 MED ORDER — SODIUM CHLORIDE 0.9% FLUSH
10.0000 mL | INTRAVENOUS | Status: DC | PRN
  Administered 2019-06-24: 10 mL
  Filled 2019-06-24: qty 10

## 2019-06-24 MED ORDER — SODIUM CHLORIDE 0.9 % IV SOLN
Freq: Once | INTRAVENOUS | Status: AC
  Administered 2019-06-24: 10:00:00 via INTRAVENOUS
  Filled 2019-06-24: qty 250

## 2019-06-24 MED ORDER — HEPARIN SOD (PORK) LOCK FLUSH 100 UNIT/ML IV SOLN
500.0000 [IU] | Freq: Once | INTRAVENOUS | Status: AC | PRN
  Administered 2019-06-24: 500 [IU]
  Filled 2019-06-24: qty 5

## 2019-06-24 NOTE — Progress Notes (Signed)
Patient stated she felt dizzy when standing up and getting out of car this AM. Patient states she felt fine walking from house to the car this AM, but felt dizzy when standing to come into [place] this AM. Patient denies dizziness while sitting. BP 09G systolic upon arrival to unit. Sandi Mealy, PA notified. Fluid orders entered.   1058--Van Tanner, PA at chair side to evaluate patient. Patient states she feels better now that she has received the fluids. Per Sandi Mealy, PA, complete orthostatic BP once the iron infusion is complete.  Will update Sandi Mealy, PA with results of orthostatic BP.   1147--Orthostatic BP completed. Patient denies feelings of dizziness upon standing. Patient states she feels "normal" since getting the fluids. Sandi Mealy, PA notified. Per PA, ok to encourage patient to increase fluid intake and will check BP upon leaving unit.   1212--Van Tanner, PA notified of final BP. Patient denies dizziness. Per Sandi Mealy, PA, ok for patient to be discharged home.  Patient encouraged to increase fluid intake during tube feedings and to call if she starts to feel light-headed again. Patient verbalized understanding and is agreeable to PA advice.

## 2019-06-24 NOTE — Progress Notes (Signed)
Nutrition follow-up completed with patient during iron infusion. Patient is status post treatment for laryngeal cancer. Weight continues to decrease and was documented as 138.7 pounds September 16. Noted labs: Sodium 132, glucose 115, and albumin 2.5. Patient reports she continues to tolerate 3-1/2 bottles of [facility] peptide 1.5 via feeding tube. She is using 16 ounces of Pedialyte in her feeding tube. She is sipping on some water by mouth. She has been resistant to increasing overall nutrition in the past.   Patient has lost 8% body weight over 2-1/2 months which is significant.  Patient has lost 16% body weight over 4 months. Expect severe malnutrition secondary to muscle and fat loss on physical exam and 16% weight loss over 4 months.    Tube feeding goal has been [employer], 4 bottles daily, to provide 2000 cal and 96 g protein.  Nutrition diagnosis: Unintended weight loss continues.  Estimated nutrition needs:  2000-2200 cal, 100-115 g protein, 2.0 L fluid.  Intervention: Educated patient on the importance of increasing [employer] 1.5 peptide to 1 bottle 4 times daily with 60 mL free water before and after each bolus feeding.  Patient educated to continue Pedialyte or chicken broth via feeding tube for low sodium levels. Educated patient on the importance of overall increased nutrition.  Monitoring, evaluation, goals: Patient will tolerate increased calories and protein to minimize weight loss.  Next visit: Continue to follow as needed.  Patient has my contact information.  **Disclaimer: This note was dictated with voice recognition software. Similar sounding words can inadvertently be transcribed and this note may contain transcription errors which may not have been corrected upon publication of note.**

## 2019-06-24 NOTE — Patient Instructions (Addendum)
Dehydration, Adult  Dehydration is when there is not enough fluid or water in your body. This happens when you lose more fluids than you take in. Dehydration can range from mild to very bad. It should be treated right away to keep it from getting very bad. Symptoms of mild dehydration may include:  Thirst.  Dry lips.  Slightly dry mouth.  Dry, warm skin.  Dizziness. Symptoms of moderate dehydration may include:  Very dry mouth.  Muscle cramps.  Dark pee (urine). Pee may be the color of tea.  Your body making less pee.  Your eyes making fewer tears.  Heartbeat that is uneven or faster than normal (palpitations).  Headache.  Light-headedness, especially when you stand up from sitting.  Fainting (syncope). Symptoms of very bad dehydration may include:  Changes in skin, such as: ? Cold and clammy skin. ? Blotchy (mottled) or pale skin. ? Skin that does not quickly return to normal after being lightly pinched and let go (poor skin turgor).  Changes in body fluids, such as: ? Feeling very thirsty. ? Your eyes making fewer tears. ? Not sweating when body temperature is high, such as in hot weather. ? Your body making very little pee.  Changes in vital signs, such as: ? Weak pulse. ? Pulse that is more than 100 beats a minute when you are sitting still. ? Fast breathing. ? Low blood pressure.  Other changes, such as: ? Sunken eyes. ? Cold hands and feet. ? Confusion. ? Lack of energy (lethargy). ? Trouble waking up from sleep. ? Short-term weight loss. ? Unconsciousness. Follow these instructions at home:   If told by your doctor, drink an ORS: ? Make an ORS by using instructions on the package. ? Start by drinking small amounts, about  cup (120 mL) every 5-10 minutes. ? Slowly drink more until you have had the amount that your doctor said to have.  Drink enough clear fluid to keep your pee clear or pale yellow. If you were told to drink an ORS, finish the  ORS first, then start slowly drinking clear fluids. Drink fluids such as: ? Water. Do not drink only water by itself. Doing that can make the salt (sodium) level in your body get too low (hyponatremia). ? Ice chips. ? Fruit juice that you have added water to (diluted). ? Low-calorie sports drinks.  Avoid: ? Alcohol. ? Drinks that have a lot of sugar. These include high-calorie sports drinks, fruit juice that does not have water added, and soda. ? Caffeine. ? Foods that are greasy or have a lot of fat or sugar.  Take over-the-counter and prescription medicines only as told by your doctor.  Do not take salt tablets. Doing that can make the salt level in your body get too high (hypernatremia).  Eat foods that have minerals (electrolytes). Examples include bananas, oranges, potatoes, tomatoes, and spinach.  Keep all follow-up visits as told by your doctor. This is important. Contact a doctor if:  You have belly (abdominal) pain that: ? Gets worse. ? Stays in one area (localizes).  You have a rash.  You have a stiff neck.  You get angry or annoyed more easily than normal (irritability).  You are more sleepy than normal.  You have a harder time waking up than normal.  You feel: ? Weak. ? Dizzy. ? Very thirsty.  You have peed (urinated) only a small amount of very dark pee during 6-8 hours. Get help right away if:  You have  symptoms of very bad dehydration.  You cannot drink fluids without throwing up (vomiting).  Your symptoms get worse with treatment.  You have a fever.  You have a very bad headache.  You are throwing up or having watery poop (diarrhea) and it: ? Gets worse. ? Does not go away.  You have blood or something green (bile) in your throw-up.  You have blood in your poop (stool). This may cause poop to look black and tarry.  You have not peed in 6-8 hours.  You pass out (faint).  Your heart rate when you are sitting still is more than 100 beats a  minute.  You have trouble breathing. This information is not intended to replace advice given to you by your health care provider. Make sure you discuss any questions you have with your health care provider. Document Released: 07/06/2009 Document Revised: 08/22/2017 Document Reviewed: 11/03/2015 Elsevier Patient Education  [place].  Iron Sucrose injection What is this medicine? IRON SUCROSE (AHY ern SOO krohs) is an iron complex. Iron is used to make healthy red blood cells, which carry oxygen and nutrients throughout the body. This medicine is used to treat iron deficiency anemia in people with chronic kidney disease. This medicine may be used for other purposes; ask your health care provider or pharmacist if you have questions. COMMON BRAND NAME(S): Venofer What should I tell my health care provider before I take this medicine? They need to know if you have any of these conditions:  anemia not caused by low iron levels  heart disease  high levels of iron in the blood  kidney disease  liver disease  an unusual or allergic reaction to iron, other medicines, foods, dyes, or preservatives  pregnant or trying to get pregnant  breast-feeding How should I use this medicine? This medicine is for infusion into a vein. It is given by a health care professional in a hospital or clinic setting. Talk to your pediatrician regarding the use of this medicine in children. While this drug may be prescribed for children as young as 2 years for selected conditions, precautions do apply. Overdosage: If you think you have taken too much of this medicine contact a poison control center or emergency room at once. NOTE: This medicine is only for you. Do not share this medicine with others. What if I miss a dose? It is important not to miss your dose. Call your doctor or health care professional if you are unable to keep an appointment. What may interact with this medicine? Do not take this  medicine with any of the following medications:  deferoxamine  dimercaprol  other iron products This medicine may also interact with the following medications:  chloramphenicol  deferasirox This list may not describe all possible interactions. Give your health care provider a list of all the medicines, herbs, non-prescription drugs, or dietary supplements you use. Also tell them if you smoke, drink alcohol, or use illegal drugs. Some items may interact with your medicine. What should I watch for while using this medicine? Visit your doctor or healthcare professional regularly. Tell your doctor or healthcare professional if your symptoms do not start to get better or if they get worse. You may need blood work done while you are taking this medicine. You may need to follow a special diet. Talk to your doctor. Foods that contain iron include: whole grains/cereals, dried fruits, beans, or peas, leafy green vegetables, and organ meats (liver, kidney). What side effects may I  notice from receiving this medicine? Side effects that you should report to your doctor or health care professional as soon as possible:  allergic reactions like skin rash, itching or hives, swelling of the face, lips, or tongue  breathing problems  changes in blood pressure  cough  fast, irregular heartbeat  feeling faint or lightheaded, falls  fever or chills  flushing, sweating, or hot feelings  joint or muscle aches/pains  seizures  swelling of the ankles or feet  unusually weak or tired Side effects that usually do not require medical attention (report to your doctor or health care professional if they continue or are bothersome):  diarrhea  feeling achy  headache  irritation at site where injected  nausea, vomiting  stomach upset  tiredness This list may not describe all possible side effects. Call your doctor for medical advice about side effects. You may report side effects to FDA at  1-800-FDA-1088. Where should I keep my medicine? This drug is given in a hospital or clinic and will not be stored at home. NOTE: This sheet is a summary. It may not cover all possible information. If you have questions about this medicine, talk to your doctor, pharmacist, or health care provider.  2020 Elsevier/Gold Standard (2011-06-20 17:14:35)

## 2019-06-25 ENCOUNTER — Telehealth: Payer: Self-pay | Admitting: *Deleted

## 2019-06-25 NOTE — Progress Notes (Signed)
The patient was seen in the infusion room today while she was receiving IV iron.  She reported that she became dizzy while walking into the clinic this morning.  Her blood pressure was noted to be 87/50.  She has no history of hypertension or heart disease and is on no cardiac medications.  She has a tracheostomy receives her nutrition via a PEG tube.  She has been taking in around three 16 ounce bottles of water daily.  She was given 1 L of normal saline IV x1 prior to receiving her IV iron today.  Her blood pressure increased to 99/55.  She was asymptomatic.  She was told to increase her daily free fluid intake by 1 extra bottle of water.  She expressed understanding and agreement with this plan.  Sandi Mealy, MHS, PA-C Physician Assistant

## 2019-06-25 NOTE — Telephone Encounter (Signed)
Oncology Nurse Navigator Documentation  Returned call to Ms. Rauen's partner [name].  She provided update s/p Wed's appt with Dr. Conley Canal [facility].  Indicated Mathew is scheduled for total laryngectomy 10/22, will be in hospital ca 2 weeks and rehab for 4-6 weeks.  Confirmed Gita plans to arrive for next Thursday's 10/8 8:30 iron infusion, inquired about adjusting 83/72 appt as it conflicts with multiple appts that morning at [facility].  I indicated I will check with Dr. Maylon Peppers. She agreed to provide future updates as   Note routed to Drs. Maylon Peppers and Isidore Moos.Gayleen Orem, RN, BSN Head & Neck Oncology Nurse [place] at [place] [phone]

## 2019-06-26 NOTE — Telephone Encounter (Signed)
She can receive 1 dose of IV iron before the surgery, and we can cancel the second dose if it conflicts with her surgery.  Thanks.  Dr. Maylon Peppers

## 2019-06-29 NOTE — Telephone Encounter (Signed)
10/15 iron infusion rescheduled for 10/14.

## 2019-06-29 NOTE — Telephone Encounter (Signed)
Thank you.  Dr. Maylon Peppers

## 2019-07-01 ENCOUNTER — Other Ambulatory Visit: Payer: Self-pay

## 2019-07-01 ENCOUNTER — Encounter: Payer: Self-pay | Admitting: *Deleted

## 2019-07-01 ENCOUNTER — Inpatient Hospital Stay: Payer: Medicare HMO

## 2019-07-01 VITALS — BP 90/52 | HR 79 | Temp 98.7°F | Resp 18

## 2019-07-01 DIAGNOSIS — E039 Hypothyroidism, unspecified: Secondary | ICD-10-CM | POA: Diagnosis not present

## 2019-07-01 DIAGNOSIS — E871 Hypo-osmolality and hyponatremia: Secondary | ICD-10-CM | POA: Diagnosis not present

## 2019-07-01 DIAGNOSIS — Z79899 Other long term (current) drug therapy: Secondary | ICD-10-CM | POA: Diagnosis not present

## 2019-07-01 DIAGNOSIS — C321 Malignant neoplasm of supraglottis: Secondary | ICD-10-CM

## 2019-07-01 DIAGNOSIS — D6481 Anemia due to antineoplastic chemotherapy: Secondary | ICD-10-CM | POA: Diagnosis not present

## 2019-07-01 DIAGNOSIS — E46 Unspecified protein-calorie malnutrition: Secondary | ICD-10-CM | POA: Diagnosis not present

## 2019-07-01 DIAGNOSIS — I959 Hypotension, unspecified: Secondary | ICD-10-CM | POA: Diagnosis not present

## 2019-07-01 DIAGNOSIS — T451X5A Adverse effect of antineoplastic and immunosuppressive drugs, initial encounter: Secondary | ICD-10-CM | POA: Diagnosis not present

## 2019-07-01 DIAGNOSIS — Z95828 Presence of other vascular implants and grafts: Secondary | ICD-10-CM

## 2019-07-01 MED ORDER — ALTEPLASE 2 MG IJ SOLR
2.0000 mg | Freq: Once | INTRAMUSCULAR | Status: DC | PRN
  Filled 2019-07-01: qty 2

## 2019-07-01 MED ORDER — SODIUM CHLORIDE 0.9 % IV SOLN
200.0000 mg | Freq: Once | INTRAVENOUS | Status: AC
  Administered 2019-07-01: 09:00:00 200 mg via INTRAVENOUS
  Filled 2019-07-01: qty 10

## 2019-07-01 MED ORDER — HEPARIN SOD (PORK) LOCK FLUSH 100 UNIT/ML IV SOLN
500.0000 [IU] | Freq: Once | INTRAVENOUS | Status: AC | PRN
  Administered 2019-07-01: 500 [IU]
  Filled 2019-07-01: qty 5

## 2019-07-01 MED ORDER — SODIUM CHLORIDE 0.9% FLUSH
10.0000 mL | Freq: Once | INTRAVENOUS | Status: AC | PRN
  Administered 2019-07-01: 11:00:00 10 mL
  Filled 2019-07-01: qty 10

## 2019-07-01 MED ORDER — SODIUM CHLORIDE 0.9 % IV SOLN
Freq: Once | INTRAVENOUS | Status: AC
  Administered 2019-07-01: 09:00:00 via INTRAVENOUS
  Filled 2019-07-01: qty 250

## 2019-07-01 NOTE — Patient Instructions (Signed)

## 2019-07-01 NOTE — Progress Notes (Signed)
Oncology Nurse Navigator Documentation  Met with Ms. Gatliff in Infusion to check on her well-being. She expressed concerns re upcoming laryngectomy, recovery, QOL.  To the best of my ability, I explained devices used for speaking, prognosis w/wo laryngectomy, whether or not she would receive continued care at [facility]. Provided emotional support, encouraged her/family to call me as needed.  She agreed to do so.  Gayleen Orem, RN, BSN Head & Neck Oncology Nurse [place] at [place] [phone]

## 2019-07-07 ENCOUNTER — Inpatient Hospital Stay (HOSPITAL_BASED_OUTPATIENT_CLINIC_OR_DEPARTMENT_OTHER): Payer: Medicare HMO | Admitting: Hematology

## 2019-07-07 ENCOUNTER — Inpatient Hospital Stay: Payer: Medicare HMO

## 2019-07-07 ENCOUNTER — Inpatient Hospital Stay (HOSPITAL_BASED_OUTPATIENT_CLINIC_OR_DEPARTMENT_OTHER): Payer: Medicare HMO | Admitting: Medical

## 2019-07-07 ENCOUNTER — Other Ambulatory Visit: Payer: Self-pay

## 2019-07-07 ENCOUNTER — Encounter: Payer: Self-pay | Admitting: Hematology

## 2019-07-07 ENCOUNTER — Inpatient Hospital Stay: Payer: Medicare HMO | Admitting: Nutrition

## 2019-07-07 VITALS — BP 93/60 | HR 74 | Temp 98.2°F | Resp 18

## 2019-07-07 VITALS — BP 105/63 | HR 87 | Temp 98.9°F | Resp 18 | Ht 62.0 in | Wt 130.2 lb

## 2019-07-07 DIAGNOSIS — Z79899 Other long term (current) drug therapy: Secondary | ICD-10-CM | POA: Diagnosis not present

## 2019-07-07 DIAGNOSIS — E871 Hypo-osmolality and hyponatremia: Secondary | ICD-10-CM

## 2019-07-07 DIAGNOSIS — Z95828 Presence of other vascular implants and grafts: Secondary | ICD-10-CM

## 2019-07-07 DIAGNOSIS — C321 Malignant neoplasm of supraglottis: Secondary | ICD-10-CM | POA: Diagnosis not present

## 2019-07-07 DIAGNOSIS — D6481 Anemia due to antineoplastic chemotherapy: Secondary | ICD-10-CM | POA: Diagnosis not present

## 2019-07-07 DIAGNOSIS — I959 Hypotension, unspecified: Secondary | ICD-10-CM

## 2019-07-07 DIAGNOSIS — E46 Unspecified protein-calorie malnutrition: Secondary | ICD-10-CM

## 2019-07-07 DIAGNOSIS — T451X5A Adverse effect of antineoplastic and immunosuppressive drugs, initial encounter: Secondary | ICD-10-CM | POA: Diagnosis not present

## 2019-07-07 DIAGNOSIS — E039 Hypothyroidism, unspecified: Secondary | ICD-10-CM | POA: Diagnosis not present

## 2019-07-07 DIAGNOSIS — Z7189 Other specified counseling: Secondary | ICD-10-CM

## 2019-07-07 DIAGNOSIS — W19XXXA Unspecified fall, initial encounter: Secondary | ICD-10-CM

## 2019-07-07 LAB — CMP (CANCER CENTER ONLY)
ALT: 39 U/L (ref 0–44)
AST: 28 U/L (ref 15–41)
Albumin: 2.6 g/dL — ABNORMAL LOW (ref 3.5–5.0)
Alkaline Phosphatase: 106 U/L (ref 38–126)
Anion gap: 11 (ref 5–15)
BUN: 17 mg/dL (ref 8–23)
CO2: 25 mmol/L (ref 22–32)
Calcium: 9.4 mg/dL (ref 8.9–10.3)
Chloride: 96 mmol/L — ABNORMAL LOW (ref 98–111)
Creatinine: 0.74 mg/dL (ref 0.44–1.00)
GFR, Est AFR Am: >60 mL/min (ref >60)
GFR, Estimated: >60 mL/min (ref >60)
Glucose, Bld: 121 mg/dL — ABNORMAL HIGH (ref 70–99)
Potassium: 4.2 mmol/L (ref 3.5–5.1)
Sodium: 132 mmol/L — ABNORMAL LOW (ref 135–145)
Total Bilirubin: 0.3 mg/dL (ref 0.3–1.2)
Total Protein: 7.4 g/dL (ref 6.5–8.1)

## 2019-07-07 LAB — CBC WITH DIFFERENTIAL (CANCER CENTER ONLY)
Abs Immature Granulocytes: 0.05 10*3/uL (ref 0.00–0.07)
Basophils Absolute: 0 10*3/uL (ref 0.0–0.1)
Basophils Relative: 0 %
Eosinophils Absolute: 0.1 10*3/uL (ref 0.0–0.5)
Eosinophils Relative: 1 %
HCT: 29 % — ABNORMAL LOW (ref 36.0–46.0)
Hemoglobin: 9 g/dL — ABNORMAL LOW (ref 12.0–15.0)
Immature Granulocytes: 1 %
Lymphocytes Relative: 7 %
Lymphs Abs: 0.7 10*3/uL (ref 0.7–4.0)
MCH: 24.7 pg — ABNORMAL LOW (ref 26.0–34.0)
MCHC: 31 g/dL (ref 30.0–36.0)
MCV: 79.5 fL — ABNORMAL LOW (ref 80.0–100.0)
Monocytes Absolute: 1 10*3/uL (ref 0.1–1.0)
Monocytes Relative: 10 %
Neutro Abs: 8.2 10*3/uL — ABNORMAL HIGH (ref 1.7–7.7)
Neutrophils Relative %: 81 %
Platelet Count: 376 10*3/uL (ref 150–400)
RBC: 3.65 MIL/uL — ABNORMAL LOW (ref 3.87–5.11)
RDW: 17.2 % — ABNORMAL HIGH (ref 11.5–15.5)
WBC Count: 10.1 10*3/uL (ref 4.0–10.5)
nRBC: 0 % (ref 0.0–0.2)

## 2019-07-07 LAB — TSH: TSH: 2.125 u[IU]/mL (ref 0.308–3.960)

## 2019-07-07 LAB — T4, FREE: Free T4: 1.44 ng/dL — ABNORMAL HIGH (ref 0.61–1.12)

## 2019-07-07 MED ORDER — SODIUM CHLORIDE 0.9 % IV SOLN
Freq: Once | INTRAVENOUS | Status: AC
  Administered 2019-07-07: 09:00:00 via INTRAVENOUS
  Filled 2019-07-07: qty 250

## 2019-07-07 MED ORDER — SODIUM CHLORIDE 0.9% FLUSH
10.0000 mL | INTRAVENOUS | Status: DC | PRN
  Administered 2019-07-07: 10 mL
  Filled 2019-07-07: qty 10

## 2019-07-07 MED ORDER — SODIUM CHLORIDE 0.9 % IV SOLN
200.0000 mg | Freq: Once | INTRAVENOUS | Status: AC
  Administered 2019-07-07: 200 mg via INTRAVENOUS
  Filled 2019-07-07: qty 10

## 2019-07-07 MED ORDER — HEPARIN SOD (PORK) LOCK FLUSH 100 UNIT/ML IV SOLN
500.0000 [IU] | Freq: Once | INTRAVENOUS | Status: AC | PRN
  Administered 2019-07-07: 500 [IU]
  Filled 2019-07-07: qty 5

## 2019-07-07 NOTE — Progress Notes (Signed)
[place] OFFICE PROGRESS NOTE  Patient Care Team: Philmore Pali, NP as PCP - General (Nurse Practitioner) Eppie Gibson, MD as Attending Physician (Radiation Oncology) Tish Men, MD as Consulting Physician (Hematology) Leota Sauers, RN as Oncology Nurse Navigator Karie Mainland, RD as Dietitian (Nutrition) Sharen Counter, CCC-SLP as Speech Language Pathologist (Speech Pathology)  HEME/ONC OVERVIEW: 1.  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 -11/2018:   3.1 x 2.8cm left supraglottic laryngeal mass extending across the anterior commissure and invading the prelaryngeal fat and suspicious <1cm bilateral Level II cervical LN's on CT  Transferred to [facility] for airway compromise and taken to OR for tracheostomy; multiple bx'es I have -12/2018: repeat DL with bx of the supraglottic mass showed well-differentiated invasive squamous cell carcinoma  -01/2019: PET showed locally advanced disease, including bilateral cervical LN involvement; mildly FDG-avid mediastinal LN's, bx negative for malignancy -Mid-01/2019 - 03/2019: definitive chemoradiation with weekly cisplatin x 7 doses -05/2019:   Laryngoscopy suspicious for locally recurrent/residual disease   Bulky laryngeal tumor difficult to evaluate due to edema from radiation on CT, but overall improving appearance; no definite progressive disease   2. 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 Conley Canal of ENT at [facility] for consideration of salvage  laryngectomy -Patient is currently scheduled for restaging PET on 07/08/2019; if PET does not show any evidence of definite metastatic disease, patient has been scheduled tentatively for salvage laryngectomy at [facility] -She has several questions regarding the benefit of the surgery as well as its potential impact on her quality of life.   -I reviewed the NCCN guideline in detail with the patient -In light of the suspicious residual/recurrent disease, the standard-of-care approach is salvage laryngectomy, which is the only potentially curative treatment.  If she elects to forego surgery, then the goal of systemic therapy would only be for palliative intent, not curative -However, whether the patient thinks is "worth it", that would be a subjective decision, based on her goals of care and the potential impact on her quality of life -Patient expressed understanding, and will continue to follow up with Dr. Conley Canal for further discussion of surgical options -If she elects against salvage surgery, then we can see the patient back in clinic to discuss palliative treatment options  Normocytic anemia -Likely multifactorial, including recent chemotherapy and iron deficiency -S/p 4 doses of IV iron -Hgb 9.0 today, improving  -Clinically, patient denies any symptoms of bleeding -We will monitor it for now  Hyponatremia  -Likely due to dilution and decreased salt intake  -Na 132, stable -I recommend the patient to adhere to nutritional recommendations, including chicken broth via the feeding tube -We will monitor it for now    Protein malnutrition -Secondary to chemoradiation -Weight down by 8 lbs since last visit -Currently taking 3-4 cans of [employer] via the feeding tube as well as water and Pedialyte -I encouraged patient to adhere to nutritional recommendations and continue to increase tube feeding as tolerated to maintain her weight in anticipation of surgery   Hypothyroidism -Currently on  Synthroid 152mg -Continue  follow-up with her endocrinologist   Goals of care discussion -Patient inquires if salvage laryngectomy is "worth it", given the potential impact on her quality of life -I informed the patient that while there is no guarantee, salvage laryngectomy is only potentially curative treatment, and any systemic therapy (chemotherapy or immunotherapy) would only be for palliative intent -Ultimately, it is the patient's decision whether she thinks the potential chance of curing the malignancy is worth the impact on her quality of life -I encouraged patient to continue the discussion with Dr. Conley Canal  -If she elects against salvage laryngectomy, we can see her back in clinic to discuss palliative treatment options  No orders of the defined types were placed in this encounter.  All questions were answered. The patient knows to call the clinic with any problems, questions or concerns. No barriers to learning was detected.  A total of more than 40 minutes were spent face-to-face with the patient during this encounter and over half of that time was spent on counseling and coordination of care as outlined above.   Tentatively return to clinic on 07/21/2019.  If the patient elects to proceed with salvage laryngectomy, then we can delay to clinic appointment as needed.  Tish Men, MD 07/07/2019 1:03 PM  CHIEF COMPLAINT: "I want to know what I should do"  INTERVAL HISTORY: Mr. Pancoast returns to clinic for follow-up of recurrent/residual squamous cell carcinoma of the supraglottic larynx s/p definitive chemoradiation.  Patient reports that she has met with Dr. Conley Canal at [facility] for consideration of salvage laryngectomy, and has PET scheduled on 07/08/2019 for restaging before making a final decision regarding surgery.  She is very concerned about the potential impact on her quality of life, and presents to clinic today to discuss some of the pros and cons of surgery.  She is  currently taking 3-4 bottles of [facility] via her feeding tube, as well as several bottles of Pedialyte and water.  She has lost approximately approximately 8 pounds since last visit.  She denies any other complaint today.  REVIEW OF SYSTEMS:   Constitutional: ( - ) fevers, ( - )  chills , ( - ) night sweats Eyes: ( - ) blurriness of vision, ( - ) double vision, ( - ) watery eyes Ears, nose, mouth, throat, and face: ( - ) mucositis, ( - ) sore throat Respiratory: ( - ) cough, ( - ) dyspnea, ( - ) wheezes Cardiovascular: ( - ) palpitation, ( - ) chest discomfort, ( - ) lower extremity swelling Gastrointestinal:  ( - ) nausea, ( - ) heartburn, ( - ) change in bowel habits Skin: ( - ) abnormal skin rashes Lymphatics: ( - ) new lymphadenopathy, ( - ) easy bruising Neurological: ( - ) numbness, ( - ) tingling, ( - ) new weaknesses Behavioral/Psych: ( - ) mood change, ( - ) new changes  All other systems were reviewed with the patient and are negative.  SUMMARY OF ONCOLOGIC HISTORY: Oncology History  Malignant neoplasm of supraglottis ([place])  12/02/2018 Imaging   CT neck: IMPRESSION: 1. Supraglottic laryngeal carcinoma extending across the anterior commissure and invading the prelaryngeal fat, left aryepiglottic fold and left aspect of the epiglottis. 2. Severe narrowing of the laryngeal airway. 3. Bilateral subcentimeter level 2A and 2A cervical lymph nodes.  Critical Value/emergent results were called by telephone at the time of interpretation on 12/02/2018 at 5:34 pm to Dr. Collier Salina, who verbally acknowledged these results.   12/02/2018 Imaging  CT  Chest:  IMPRESSION: No evidence of metastatic disease in the chest.   12/03/2018 Pathology Results   (Care Everywhere; [facility]) A: Larynx, supraglottis, biopsy - Atypical squamous proliferation with at least low grade dysplasia and abundant keratinization - No definite high grade dysplasia or invasive carcinoma represented (see  comment)   12/18/2018 Pathology Results   (Care Everywhere; [facility]) A:  Neck, left, fine needle aspiration - Rare single atypical cells, no cells diagnostic of malignancy - Lymph node material present  B:  Neck, right, fine needle aspiration - Non-diagnostic specimen - No lymph node material identified - Mixed inflammation and blood   12/31/2018 Pathology Results   (Care Everywhere; [facility])  A: Larynx, left supraglottic mass, biopsy - Atypical squamous proliferation with abundant keratinization, suspicious for well-differentiated invasive squamous cell carcinoma  B: Larynx, left supraglottic mass, biopsy - Well-differentiated invasive squamous cell carcinoma with abundant keratinization (see comment)   01/18/2019 Initial Diagnosis   Laryngeal cancer ([place])   01/26/2019 Imaging   PET: IMPRESSION: 1. Locally advanced hypermetabolic laryngeal neoplasm centered in the left supraglottic space with bilateral glottic and epiglottic involvement and subglottic extension on the left. 2. Hypermetabolic bilateral level 3 neck nodal metastases. 3. Hypermetabolic high mediastinal nodal metastases between the trachea and upper thoracic esophagus. 4. Otherwise no distant hypermetabolic metastatic disease. 5. Nodular 7 mm focus at the left lung base, below PET resolution, recommend attention on follow-up chest CT in 3 months. 6. Chronic findings include: Aortic Atherosclerosis (ICD10-I70.0). Cholelithiasis.   02/08/2019 Cancer Staging   Staging form: Larynx - Supraglottis, AJCC 8th Edition - Clinical stage from 02/08/2019: Stage IVA (cT3, cN2c, cM0) - Signed by Eppie Gibson, MD on 02/08/2019   02/11/2019 - 04/07/2019 Chemotherapy   The patient had palonosetron (ALOXI) injection 0.25 mg, 0.25 mg, Intravenous,  Once, 7 of 7 cycles Administration: 0.25 mg (02/11/2019), 0.25 mg (02/18/2019), 0.25 mg (03/04/2019), 0.25 mg (03/11/2019), 0.25 mg (03/18/2019), 0.25 mg (03/25/2019), 0.25 mg  (04/01/2019) CISplatin (PLATINOL) 72 mg in sodium chloride 0.9 % 250 mL chemo infusion, 40 mg/m2 = 72 mg, Intravenous,  Once, 7 of 7 cycles Administration: 72 mg (02/11/2019), 72 mg (02/18/2019), 72 mg (03/04/2019), 72 mg (03/11/2019), 72 mg (03/18/2019), 72 mg (03/25/2019), 72 mg (04/01/2019) fosaprepitant (EMEND) 150 mg, dexamethasone (DECADRON) 12 mg in sodium chloride 0.9 % 145 mL IVPB, , Intravenous,  Once, 7 of 7 cycles Administration:  (02/11/2019),  (02/18/2019),  (03/04/2019),  (03/11/2019),  (03/18/2019),  (03/25/2019),  (04/01/2019)  for chemotherapy treatment.      I have reviewed the past medical history, past surgical history, social history and family history with the patient and they are unchanged from previous note.  ALLERGIES:  has No Known Allergies.  MEDICATIONS:  Current Outpatient Medications  Medication Sig Dispense Refill  . chlorhexidine (PERIDEX) 0.12 % solution Rinse with 15 mls twice daily for 30 seconds. Use after breakfast and at bedtime. Spit out excess. Do not swallow. (Patient taking differently: Use as directed 15 mLs in the mouth or throat 2 (two) times daily. Rinse with 15 mls twice daily for 30 seconds. Use after breakfast and at bedtime. Spit out excess. Do not swallow.) 480 mL prn  . docusate sodium (COLACE) 100 MG capsule Take 100 mg by mouth 2 (two) times daily.    Marland Kitchen levothyroxine (SYNTHROID) 175 MCG tablet Take 175 mcg by mouth every other day. Alternating with 150 mcg dose    . lidocaine (XYLOCAINE) 2 % solution Patient: Mix 1part 2% viscous  lidocaine, 1part H20. Swallow 18m of diluted mixture, 36m before meals and at bedtime, up to QID prn soreness 100 mL 5  . LORazepam (ATIVAN) 0.5 MG tablet Take 1 tablet (0.5 mg total) by mouth every 6 (six) hours as needed (Nausea or vomiting). 30 tablet 0  . Nutritional Supplements (KATE FARMS PEPTIDE 1.5) LIQD Take 4 Bottles by mouth daily. 325 mL 6  . sodium fluoride (PREVIDENT 5000 PLUS) 1.1 % CREA dental cream Apply cream to  tooth brush. Brush teeth for 2 minutes. Spit out excess. DO NOT rinse afterwards. Repeat nightly. (Patient taking differently: Place 1 application onto teeth 2 (two) times a day. ) 1 Tube prn   No current facility-administered medications for this visit.     PHYSICAL EXAMINATION: ECOG PERFORMANCE STATUS: 1 - Symptomatic but completely ambulatory  Today's Vitals   07/07/19 1226 07/07/19 1236  BP: 105/63   Pulse: 87   Resp: 18   Temp: 98.9 F (37.2 C)   TempSrc: Temporal   SpO2: 99%   Weight: 130 lb 3.2 oz (59.1 kg)   Height: 5' 2"  (1.575 m)   PainSc:  0-No pain   Body mass index is 23.81 kg/m.  Filed Weights   07/07/19 1226  Weight: 130 lb 3.2 oz (59.1 kg)    GENERAL: alert, no distress and comfortable SKIN: skin color, texture, turgor are normal, no rashes or significant lesions EYES: conjunctiva are pink and non-injected, sclera clear OROPHARYNX: no exudate, no erythema; lips, buccal mucosa, and tongue normal  NECK: a firm mass palpated in the anterior neck  LYMPH:  shotty cervical adenopathy, R > L, mildly tendern with palpation  LUNGS: clear to auscultation with normal breathing effort HEART: regular rate & rhythm and no murmurs and no lower extremity edema ABDOMEN: soft, non-tender, non-distended, normal bowel sounds Musculoskeletal: no cyanosis of digits and no clubbing  PSYCH: alert, whispering speech due to tracheostomy   LABORATORY DATA:  I have reviewed the data as listed    Component Value Date/Time   NA 132 (L) 07/07/2019 0825   K 4.2 07/07/2019 0825   CL 96 (L) 07/07/2019 0825   CO2 25 07/07/2019 0825   GLUCOSE 121 (H) 07/07/2019 0825   BUN 17 07/07/2019 0825   CREATININE 0.74 07/07/2019 0825   CALCIUM 9.4 07/07/2019 0825   PROT 7.4 07/07/2019 0825   ALBUMIN 2.6 (L) 07/07/2019 0825   AST 28 07/07/2019 0825   ALT 39 07/07/2019 0825   ALKPHOS 106 07/07/2019 0825   BILITOT 0.3 07/07/2019 0825   GFRNONAA >60 07/07/2019 0825   GFRAA >60 07/07/2019  0825    No results found for: SPEP, UPEP  Lab Results  Component Value Date   WBC 10.1 07/07/2019   NEUTROABS 8.2 (H) 07/07/2019   HGB 9.0 (L) 07/07/2019   HCT 29.0 (L) 07/07/2019   MCV 79.5 (L) 07/07/2019   PLT 376 07/07/2019      Chemistry      Component Value Date/Time   NA 132 (L) 07/07/2019 0825   K 4.2 07/07/2019 0825   CL 96 (L) 07/07/2019 0825   CO2 25 07/07/2019 0825   BUN 17 07/07/2019 0825   CREATININE 0.74 07/07/2019 0825      Component Value Date/Time   CALCIUM 9.4 07/07/2019 0825   ALKPHOS 106 07/07/2019 0825   AST 28 07/07/2019 0825   ALT 39 07/07/2019 0825   BILITOT 0.3 07/07/2019 0825       RADIOGRAPHIC STUDIES: I have personally reviewed the  radiological images as listed below and agreed with the findings in the report. US Abdomen Complete  Result Date: 06/17/2019 CLINICAL DATA:  Transaminitis.  History of laryngeal carcinoma. EXAM: ABDOMEN ULTRASOUND COMPLETE COMPARISON:  None. FINDINGS: Gallbladder: Shadowing stones, largest measuring 2.1 cm. No wall thickening. No pericholecystic fluid. Common bile duct: Diameter: 2 mm Liver: No focal lesion identified. Within normal limits in parenchymal echogenicity. Portal vein is patent on color Doppler imaging with normal direction of blood flow towards the liver. IVC: No abnormality visualized. Pancreas: Not visualized. Spleen: Size and appearance within normal limits. Right Kidney: Length: 9.9 cm. Echogenicity within normal limits. No mass or hydronephrosis visualized. Left Kidney: Length: 9.9 cm. Echogenicity within normal limits. No mass or hydronephrosis visualized. Abdominal aorta: Diffuse irregular atherosclerosis.  No aneurysm. Other findings: None. IMPRESSION: 1. No acute findings.  No sonographic abnormality of the liver. 2. Cholelithiasis with no evidence of acute cholecystitis. No bile duct dilation. 3. Aortic atherosclerosis. Electronically Signed   By: Lajean Manes M.D.   On: 06/17/2019 16:55

## 2019-07-07 NOTE — Progress Notes (Signed)
Nutrition follow-up completed with patient during infusion. Patient is scheduled for a total laryngectomy on October 22 at [facility]. Last weight documented was 138.7 pounds September 16. Educated patient to really work to [place] feedings to goal rate to provide necessary nutrients for healing. I will continue to follow patient as needed.

## 2019-07-07 NOTE — Patient Instructions (Signed)
Iron Sucrose injection What is this medicine? IRON SUCROSE (AHY ern SOO krohs) is an iron complex. Iron is used to make healthy red blood cells, which carry oxygen and nutrients throughout the body. This medicine is used to treat iron deficiency anemia in people with chronic kidney disease. This medicine may be used for other purposes; ask your health care provider or pharmacist if you have questions. COMMON BRAND NAME(S): Venofer What should I tell my health care provider before I take this medicine? They need to know if you have any of these conditions:  anemia not caused by low iron levels  heart disease  high levels of iron in the blood  kidney disease  liver disease  an unusual or allergic reaction to iron, other medicines, foods, dyes, or preservatives  pregnant or trying to get pregnant  breast-feeding How should I use this medicine? This medicine is for infusion into a vein. It is given by a health care professional in a hospital or clinic setting. Talk to your pediatrician regarding the use of this medicine in children. While this drug may be prescribed for children as young as 2 years for selected conditions, precautions do apply. Overdosage: If you think you have taken too much of this medicine contact a poison control center or emergency room at once. NOTE: This medicine is only for you. Do not share this medicine with others. What if I miss a dose? It is important not to miss your dose. Call your doctor or health care professional if you are unable to keep an appointment. What may interact with this medicine? Do not take this medicine with any of the following medications:  deferoxamine  dimercaprol  other iron products This medicine may also interact with the following medications:  chloramphenicol  deferasirox This list may not describe all possible interactions. Give your health care provider a list of all the medicines, herbs, non-prescription drugs, or  dietary supplements you use. Also tell them if you smoke, drink alcohol, or use illegal drugs. Some items may interact with your medicine. What should I watch for while using this medicine? Visit your doctor or healthcare professional regularly. Tell your doctor or healthcare professional if your symptoms do not start to get better or if they get worse. You may need blood work done while you are taking this medicine. You may need to follow a special diet. Talk to your doctor. Foods that contain iron include: whole grains/cereals, dried fruits, beans, or peas, leafy green vegetables, and organ meats (liver, kidney). What side effects may I notice from receiving this medicine? Side effects that you should report to your doctor or health care professional as soon as possible:  allergic reactions like skin rash, itching or hives, swelling of the face, lips, or tongue  breathing problems  changes in blood pressure  cough  fast, irregular heartbeat  feeling faint or lightheaded, falls  fever or chills  flushing, sweating, or hot feelings  joint or muscle aches/pains  seizures  swelling of the ankles or feet  unusually weak or tired Side effects that usually do not require medical attention (report to your doctor or health care professional if they continue or are bothersome):  diarrhea  feeling achy  headache  irritation at site where injected  nausea, vomiting  stomach upset  tiredness This list may not describe all possible side effects. Call your doctor for medical advice about side effects. You may report side effects to FDA at 1-800-FDA-1088. Where should I keep   my medicine? This drug is given in a hospital or clinic and will not be stored at home. NOTE: This sheet is a summary. It may not cover all possible information. If you have questions about this medicine, talk to your doctor, pharmacist, or health care provider.  2020 Elsevier/Gold Standard (2011-06-20  17:14:35) Coronavirus (COVID-19) Are you at risk?  Are you at risk for the Coronavirus (COVID-19)?  To be considered HIGH RISK for Coronavirus (COVID-19), you have to meet the following criteria:  . Traveled to [place], [place], [place], [place] or [place]; or in the [place] to [place], [place], [place], or [place]; and have fever, cough, and shortness of breath within the last 2 weeks of travel OR . Been in close contact with a person diagnosed with COVID-19 within the last 2 weeks and have fever, cough, and shortness of breath . IF YOU DO NOT MEET THESE CRITERIA, YOU ARE CONSIDERED LOW RISK FOR COVID-19.  What to do if you are HIGH RISK for COVID-19?  . If you are having a medical emergency, call 911. . Seek medical care right away. Before you go to a doctor's office, urgent care or emergency department, call ahead and tell them about your recent travel, contact with someone diagnosed with COVID-19, and your symptoms. You should receive instructions from your physician's office regarding next steps of care.  . When you arrive at healthcare provider, tell the healthcare staff immediately you have returned from visiting [place], [place], [place], [place] or [place]; or traveled in the [place] to [place], [place], [place], or [place]; in the last two weeks or you have been in close contact with a person diagnosed with COVID-19 in the last 2 weeks.   . Tell the health care staff about your symptoms: fever, cough and shortness of breath. . After you have been seen by a medical provider, you will be either: o Tested for (COVID-19) and discharged home on quarantine except to seek medical care if symptoms worsen, and asked to  - Stay home and avoid contact with others until you get your results (4-5 days)  - Avoid travel on public transportation if possible (such as bus, train, or airplane) or o Sent to the Emergency Department by EMS for evaluation, COVID-19 testing, and  possible admission depending on your condition and test results.  What to do if you are LOW RISK for COVID-19?  Reduce your risk of any infection by using the same precautions used for avoiding the common cold or flu:  . Wash your hands often with soap and warm water for at least 20 seconds.  If soap and water are not readily available, use an alcohol-based hand sanitizer with at least 60% alcohol.  . If coughing or sneezing, cover your mouth and nose by coughing or sneezing into the elbow areas of your shirt or coat, into a tissue or into your sleeve (not your hands). . Avoid shaking hands with others and consider head nods or verbal greetings only. . Avoid touching your eyes, nose, or mouth with unwashed hands.  . Avoid close contact with people who are sick. . Avoid places or events with large numbers of people in one location, like concerts or sporting events. . Carefully consider travel plans you have or are making. . If you are planning any travel outside or inside the [place], visit the CDC's Travelers' Health webpage for the latest health notices. . If you have some symptoms but not all   symptoms, continue to monitor at home and seek medical attention if your symptoms worsen. . If you are having a medical emergency, call 911.   ADDITIONAL HEALTHCARE OPTIONS FOR PATIENTS  [place] [facility] [facility] / e-Visit: [email or web address]         [facility]: [phone]  [facility]: [phone]                   [facility]: [phone]  

## 2019-07-08 ENCOUNTER — Telehealth: Payer: Self-pay | Admitting: Hematology

## 2019-07-08 ENCOUNTER — Ambulatory Visit: Payer: Medicare HMO

## 2019-07-08 DIAGNOSIS — Z01818 Encounter for other preprocedural examination: Secondary | ICD-10-CM | POA: Diagnosis not present

## 2019-07-08 DIAGNOSIS — Z923 Personal history of irradiation: Secondary | ICD-10-CM | POA: Diagnosis not present

## 2019-07-08 DIAGNOSIS — C329 Malignant neoplasm of larynx, unspecified: Secondary | ICD-10-CM | POA: Diagnosis not present

## 2019-07-08 DIAGNOSIS — Z20828 Contact with and (suspected) exposure to other viral communicable diseases: Secondary | ICD-10-CM | POA: Diagnosis not present

## 2019-07-08 DIAGNOSIS — C328 Malignant neoplasm of overlapping sites of larynx: Secondary | ICD-10-CM | POA: Diagnosis not present

## 2019-07-08 DIAGNOSIS — R590 Localized enlarged lymph nodes: Secondary | ICD-10-CM | POA: Diagnosis not present

## 2019-07-08 DIAGNOSIS — R491 Aphonia: Secondary | ICD-10-CM | POA: Diagnosis not present

## 2019-07-08 DIAGNOSIS — Z01812 Encounter for preprocedural laboratory examination: Secondary | ICD-10-CM | POA: Diagnosis not present

## 2019-07-08 NOTE — Telephone Encounter (Signed)
Per 10/14 los No change in appts

## 2019-07-09 DIAGNOSIS — Z430 Encounter for attention to tracheostomy: Secondary | ICD-10-CM | POA: Diagnosis not present

## 2019-07-09 DIAGNOSIS — J383 Other diseases of vocal cords: Secondary | ICD-10-CM | POA: Diagnosis not present

## 2019-07-09 DIAGNOSIS — J387 Other diseases of larynx: Secondary | ICD-10-CM | POA: Diagnosis not present

## 2019-07-09 NOTE — Progress Notes (Signed)
The patient was seen after she fell out of her wheelchair after completion of her treatment today.  She was in front of the cancer center and bent over to pick something up when she lost her balance and fell forward.  She landed with her hands outstretched but did hit the bridge of her nose which caused a laceration.  She had no loss of consciousness.  Physical exam: General: The patient is an adult female with a tracheotomy.  She appears to be in no acute distress. HEENT: There is a superficial laceration over the bridge of the patient's nose with slight bleeding.  There is no gross deformity in the nose.  There is a raised soft area and the right frontal skull.  Her orbits are stable with no step-offs.  Her TMs are clear bilaterally with no evidence of an effusion or bleeding. Eyes:  The patient's pupils are round and reactive to light.  A funduscopic exam shows the disks that are sharp and flat.  No AV nicking, exudate, or hemorrhaging is noted.  No papilledema is noted. Neuro:  The patient is alert and oriented.  Upper extremity strength is 5/5 bilaterally.  Assessment and plan: Blunt force trauma to the forehead.  The small laceration at the bridge of the patient's nose was cleaned and dressed.  The patient was instructed to call 911 should she develop progressive headaches, changes in levels of consciousness, or note any neurologic changes.  She expresses understanding and agreement with this plan.  This case was discussed with Dr. Maylon Peppers. He expressed agreement with my management of this patient.  Sandi Mealy, MHS, PA-C Physician Assistant

## 2019-07-12 ENCOUNTER — Ambulatory Visit
Admission: RE | Admit: 2019-07-12 | Discharge: 2019-07-12 | Disposition: A | Discharge status: Home / Self Care | Payer: Self-pay | Source: Ambulatory Visit | Attending: Radiation Oncology | Admitting: Radiation Oncology

## 2019-07-12 ENCOUNTER — Other Ambulatory Visit: Payer: Self-pay | Admitting: *Deleted

## 2019-07-12 DIAGNOSIS — C321 Malignant neoplasm of supraglottis: Secondary | ICD-10-CM

## 2019-07-15 DIAGNOSIS — C01 Malignant neoplasm of base of tongue: Secondary | ICD-10-CM | POA: Diagnosis not present

## 2019-07-15 DIAGNOSIS — Z931 Gastrostomy status: Secondary | ICD-10-CM | POA: Diagnosis not present

## 2019-07-15 DIAGNOSIS — R590 Localized enlarged lymph nodes: Secondary | ICD-10-CM | POA: Diagnosis not present

## 2019-07-15 DIAGNOSIS — C109 Malignant neoplasm of oropharynx, unspecified: Secondary | ICD-10-CM | POA: Diagnosis not present

## 2019-07-15 DIAGNOSIS — Z79899 Other long term (current) drug therapy: Secondary | ICD-10-CM | POA: Diagnosis not present

## 2019-07-15 DIAGNOSIS — E039 Hypothyroidism, unspecified: Secondary | ICD-10-CM | POA: Diagnosis not present

## 2019-07-15 DIAGNOSIS — C099 Malignant neoplasm of tonsil, unspecified: Secondary | ICD-10-CM | POA: Diagnosis not present

## 2019-07-15 DIAGNOSIS — C111 Malignant neoplasm of posterior wall of nasopharynx: Secondary | ICD-10-CM | POA: Diagnosis not present

## 2019-07-15 DIAGNOSIS — C329 Malignant neoplasm of larynx, unspecified: Secondary | ICD-10-CM | POA: Diagnosis not present

## 2019-07-15 DIAGNOSIS — D6481 Anemia due to antineoplastic chemotherapy: Secondary | ICD-10-CM | POA: Diagnosis not present

## 2019-07-15 DIAGNOSIS — Z87891 Personal history of nicotine dependence: Secondary | ICD-10-CM | POA: Diagnosis not present

## 2019-07-15 DIAGNOSIS — G620 Drug-induced polyneuropathy: Secondary | ICD-10-CM | POA: Diagnosis not present

## 2019-07-15 DIAGNOSIS — R69 Illness, unspecified: Secondary | ICD-10-CM | POA: Diagnosis not present

## 2019-07-16 DIAGNOSIS — Z9221 Personal history of antineoplastic chemotherapy: Secondary | ICD-10-CM | POA: Diagnosis not present

## 2019-07-16 DIAGNOSIS — I890 Lymphedema, not elsewhere classified: Secondary | ICD-10-CM | POA: Diagnosis not present

## 2019-07-16 DIAGNOSIS — Z87891 Personal history of nicotine dependence: Secondary | ICD-10-CM | POA: Diagnosis not present

## 2019-07-16 DIAGNOSIS — Z923 Personal history of irradiation: Secondary | ICD-10-CM | POA: Diagnosis not present

## 2019-07-16 DIAGNOSIS — Z930 Tracheostomy status: Secondary | ICD-10-CM | POA: Diagnosis not present

## 2019-07-16 DIAGNOSIS — C329 Malignant neoplasm of larynx, unspecified: Secondary | ICD-10-CM | POA: Diagnosis not present

## 2019-07-19 ENCOUNTER — Encounter (HOSPITAL_COMMUNITY): Payer: Medicare HMO

## 2019-07-20 ENCOUNTER — Ambulatory Visit: Admission: RE | Admit: 2019-07-20 | Payer: Medicare HMO | Source: Ambulatory Visit | Admitting: Radiation Oncology

## 2019-07-20 DIAGNOSIS — C770 Secondary and unspecified malignant neoplasm of lymph nodes of head, face and neck: Secondary | ICD-10-CM | POA: Diagnosis not present

## 2019-07-20 DIAGNOSIS — C029 Malignant neoplasm of tongue, unspecified: Secondary | ICD-10-CM | POA: Diagnosis not present

## 2019-07-20 DIAGNOSIS — C153 Malignant neoplasm of upper third of esophagus: Secondary | ICD-10-CM | POA: Diagnosis not present

## 2019-07-20 DIAGNOSIS — C01 Malignant neoplasm of base of tongue: Secondary | ICD-10-CM | POA: Diagnosis not present

## 2019-07-20 DIAGNOSIS — C328 Malignant neoplasm of overlapping sites of larynx: Secondary | ICD-10-CM | POA: Diagnosis not present

## 2019-07-20 DIAGNOSIS — C329 Malignant neoplasm of larynx, unspecified: Secondary | ICD-10-CM | POA: Diagnosis not present

## 2019-07-20 DIAGNOSIS — C7989 Secondary malignant neoplasm of other specified sites: Secondary | ICD-10-CM | POA: Diagnosis not present

## 2019-07-20 DIAGNOSIS — C490 Malignant neoplasm of connective and soft tissue of head, face and neck: Secondary | ICD-10-CM | POA: Diagnosis not present

## 2019-07-20 DIAGNOSIS — C33 Malignant neoplasm of trachea: Secondary | ICD-10-CM | POA: Diagnosis not present

## 2019-07-20 DIAGNOSIS — C140 Malignant neoplasm of pharynx, unspecified: Secondary | ICD-10-CM | POA: Diagnosis not present

## 2019-07-21 ENCOUNTER — Other Ambulatory Visit: Payer: Medicare HMO

## 2019-07-21 ENCOUNTER — Ambulatory Visit: Payer: Medicare HMO | Admitting: Hematology

## 2019-07-21 DIAGNOSIS — R69 Illness, unspecified: Secondary | ICD-10-CM | POA: Diagnosis not present

## 2019-07-21 DIAGNOSIS — C770 Secondary and unspecified malignant neoplasm of lymph nodes of head, face and neck: Secondary | ICD-10-CM | POA: Diagnosis not present

## 2019-07-21 DIAGNOSIS — C328 Malignant neoplasm of overlapping sites of larynx: Secondary | ICD-10-CM | POA: Diagnosis not present

## 2019-07-21 DIAGNOSIS — J387 Other diseases of larynx: Secondary | ICD-10-CM | POA: Diagnosis not present

## 2019-07-21 DIAGNOSIS — T17498A Other foreign object in trachea causing other injury, initial encounter: Secondary | ICD-10-CM | POA: Diagnosis not present

## 2019-07-21 DIAGNOSIS — C329 Malignant neoplasm of larynx, unspecified: Secondary | ICD-10-CM | POA: Diagnosis not present

## 2019-07-21 DIAGNOSIS — R1310 Dysphagia, unspecified: Secondary | ICD-10-CM | POA: Diagnosis not present

## 2019-07-21 DIAGNOSIS — R9431 Abnormal electrocardiogram [ECG] [EKG]: Secondary | ICD-10-CM | POA: Diagnosis not present

## 2019-07-21 DIAGNOSIS — I959 Hypotension, unspecified: Secondary | ICD-10-CM | POA: Diagnosis not present

## 2019-07-21 DIAGNOSIS — E039 Hypothyroidism, unspecified: Secondary | ICD-10-CM | POA: Diagnosis not present

## 2019-07-21 DIAGNOSIS — C139 Malignant neoplasm of hypopharynx, unspecified: Secondary | ICD-10-CM | POA: Diagnosis not present

## 2019-07-21 DIAGNOSIS — Y848 Other medical procedures as the cause of abnormal reaction of the patient, or of later complication, without mention of misadventure at the time of the procedure: Secondary | ICD-10-CM | POA: Diagnosis not present

## 2019-07-21 DIAGNOSIS — I4581 Long QT syndrome: Secondary | ICD-10-CM | POA: Diagnosis not present

## 2019-07-21 DIAGNOSIS — Z9889 Other specified postprocedural states: Secondary | ICD-10-CM | POA: Diagnosis not present

## 2019-07-21 DIAGNOSIS — E8779 Other fluid overload: Secondary | ICD-10-CM | POA: Diagnosis not present

## 2019-07-21 DIAGNOSIS — J383 Other diseases of vocal cords: Secondary | ICD-10-CM | POA: Diagnosis not present

## 2019-07-21 DIAGNOSIS — E890 Postprocedural hypothyroidism: Secondary | ICD-10-CM | POA: Diagnosis not present

## 2019-07-21 DIAGNOSIS — L89301 Pressure ulcer of unspecified buttock, stage 1: Secondary | ICD-10-CM | POA: Diagnosis not present

## 2019-07-21 DIAGNOSIS — C7989 Secondary malignant neoplasm of other specified sites: Secondary | ICD-10-CM | POA: Diagnosis not present

## 2019-07-21 DIAGNOSIS — D62 Acute posthemorrhagic anemia: Secondary | ICD-10-CM | POA: Diagnosis not present

## 2019-07-21 DIAGNOSIS — E208 Other hypoparathyroidism: Secondary | ICD-10-CM | POA: Diagnosis not present

## 2019-07-21 DIAGNOSIS — Z430 Encounter for attention to tracheostomy: Secondary | ICD-10-CM | POA: Diagnosis not present

## 2019-07-21 DIAGNOSIS — E8770 Fluid overload, unspecified: Secondary | ICD-10-CM | POA: Diagnosis not present

## 2019-07-21 DIAGNOSIS — C7889 Secondary malignant neoplasm of other digestive organs: Secondary | ICD-10-CM | POA: Diagnosis not present

## 2019-07-21 DIAGNOSIS — L03221 Cellulitis of neck: Secondary | ICD-10-CM | POA: Diagnosis not present

## 2019-07-22 DIAGNOSIS — C329 Malignant neoplasm of larynx, unspecified: Secondary | ICD-10-CM | POA: Diagnosis not present

## 2019-07-22 DIAGNOSIS — E039 Hypothyroidism, unspecified: Secondary | ICD-10-CM | POA: Diagnosis not present

## 2019-07-22 DIAGNOSIS — R69 Illness, unspecified: Secondary | ICD-10-CM | POA: Diagnosis not present

## 2019-07-22 DIAGNOSIS — R1310 Dysphagia, unspecified: Secondary | ICD-10-CM | POA: Diagnosis not present

## 2019-07-23 DIAGNOSIS — E039 Hypothyroidism, unspecified: Secondary | ICD-10-CM | POA: Diagnosis not present

## 2019-07-23 DIAGNOSIS — C329 Malignant neoplasm of larynx, unspecified: Secondary | ICD-10-CM | POA: Diagnosis not present

## 2019-07-23 DIAGNOSIS — R1310 Dysphagia, unspecified: Secondary | ICD-10-CM | POA: Diagnosis not present

## 2019-07-23 DIAGNOSIS — R69 Illness, unspecified: Secondary | ICD-10-CM | POA: Diagnosis not present

## 2019-07-24 DIAGNOSIS — R1310 Dysphagia, unspecified: Secondary | ICD-10-CM | POA: Diagnosis not present

## 2019-07-24 DIAGNOSIS — E039 Hypothyroidism, unspecified: Secondary | ICD-10-CM | POA: Diagnosis not present

## 2019-07-24 DIAGNOSIS — R69 Illness, unspecified: Secondary | ICD-10-CM | POA: Diagnosis not present

## 2019-07-24 DIAGNOSIS — C329 Malignant neoplasm of larynx, unspecified: Secondary | ICD-10-CM | POA: Diagnosis not present

## 2019-07-25 DIAGNOSIS — C329 Malignant neoplasm of larynx, unspecified: Secondary | ICD-10-CM | POA: Diagnosis not present

## 2019-07-25 DIAGNOSIS — E208 Other hypoparathyroidism: Secondary | ICD-10-CM | POA: Diagnosis not present

## 2019-07-25 DIAGNOSIS — E8779 Other fluid overload: Secondary | ICD-10-CM | POA: Diagnosis not present

## 2019-07-28 DIAGNOSIS — E208 Other hypoparathyroidism: Secondary | ICD-10-CM | POA: Diagnosis not present

## 2019-07-28 DIAGNOSIS — E039 Hypothyroidism, unspecified: Secondary | ICD-10-CM | POA: Diagnosis not present

## 2019-07-28 DIAGNOSIS — C329 Malignant neoplasm of larynx, unspecified: Secondary | ICD-10-CM | POA: Diagnosis not present

## 2019-07-29 DIAGNOSIS — C329 Malignant neoplasm of larynx, unspecified: Secondary | ICD-10-CM | POA: Diagnosis not present

## 2019-07-29 DIAGNOSIS — E208 Other hypoparathyroidism: Secondary | ICD-10-CM | POA: Diagnosis not present

## 2019-07-29 DIAGNOSIS — E8770 Fluid overload, unspecified: Secondary | ICD-10-CM | POA: Diagnosis not present

## 2019-07-29 DIAGNOSIS — E890 Postprocedural hypothyroidism: Secondary | ICD-10-CM | POA: Diagnosis not present

## 2019-07-30 DIAGNOSIS — E208 Other hypoparathyroidism: Secondary | ICD-10-CM | POA: Diagnosis not present

## 2019-07-30 DIAGNOSIS — E890 Postprocedural hypothyroidism: Secondary | ICD-10-CM | POA: Diagnosis not present

## 2019-07-30 DIAGNOSIS — C329 Malignant neoplasm of larynx, unspecified: Secondary | ICD-10-CM | POA: Diagnosis not present

## 2019-07-30 DIAGNOSIS — E039 Hypothyroidism, unspecified: Secondary | ICD-10-CM | POA: Diagnosis not present

## 2019-08-09 DIAGNOSIS — Z430 Encounter for attention to tracheostomy: Secondary | ICD-10-CM | POA: Diagnosis not present

## 2019-08-09 DIAGNOSIS — J383 Other diseases of vocal cords: Secondary | ICD-10-CM | POA: Diagnosis not present

## 2019-08-09 DIAGNOSIS — J387 Other diseases of larynx: Secondary | ICD-10-CM | POA: Diagnosis not present

## 2019-08-09 DIAGNOSIS — I4581 Long QT syndrome: Secondary | ICD-10-CM | POA: Diagnosis not present

## 2019-08-09 DIAGNOSIS — R9431 Abnormal electrocardiogram [ECG] [EKG]: Secondary | ICD-10-CM | POA: Diagnosis not present

## 2019-08-13 DIAGNOSIS — Z931 Gastrostomy status: Secondary | ICD-10-CM | POA: Diagnosis not present

## 2019-08-13 DIAGNOSIS — R1310 Dysphagia, unspecified: Secondary | ICD-10-CM | POA: Diagnosis not present

## 2019-08-13 DIAGNOSIS — Z930 Tracheostomy status: Secondary | ICD-10-CM | POA: Diagnosis not present

## 2019-08-13 DIAGNOSIS — E892 Postprocedural hypoparathyroidism: Secondary | ICD-10-CM | POA: Diagnosis not present

## 2019-08-13 DIAGNOSIS — I959 Hypotension, unspecified: Secondary | ICD-10-CM | POA: Diagnosis not present

## 2019-08-13 DIAGNOSIS — Z483 Aftercare following surgery for neoplasm: Secondary | ICD-10-CM | POA: Diagnosis not present

## 2019-08-13 DIAGNOSIS — C329 Malignant neoplasm of larynx, unspecified: Secondary | ICD-10-CM | POA: Diagnosis not present

## 2019-08-13 DIAGNOSIS — R69 Illness, unspecified: Secondary | ICD-10-CM | POA: Diagnosis not present

## 2019-08-13 DIAGNOSIS — I2510 Atherosclerotic heart disease of native coronary artery without angina pectoris: Secondary | ICD-10-CM | POA: Diagnosis not present

## 2019-08-16 DIAGNOSIS — R490 Dysphonia: Secondary | ICD-10-CM | POA: Diagnosis not present

## 2019-08-16 DIAGNOSIS — C329 Malignant neoplasm of larynx, unspecified: Secondary | ICD-10-CM | POA: Diagnosis not present

## 2019-08-16 DIAGNOSIS — Z9002 Acquired absence of larynx: Secondary | ICD-10-CM | POA: Diagnosis not present

## 2019-08-16 DIAGNOSIS — E892 Postprocedural hypoparathyroidism: Secondary | ICD-10-CM | POA: Diagnosis not present

## 2019-08-16 DIAGNOSIS — Z9049 Acquired absence of other specified parts of digestive tract: Secondary | ICD-10-CM | POA: Diagnosis not present

## 2019-08-16 DIAGNOSIS — E890 Postprocedural hypothyroidism: Secondary | ICD-10-CM | POA: Diagnosis not present

## 2019-08-17 DIAGNOSIS — I959 Hypotension, unspecified: Secondary | ICD-10-CM | POA: Diagnosis not present

## 2019-08-17 DIAGNOSIS — Z930 Tracheostomy status: Secondary | ICD-10-CM | POA: Diagnosis not present

## 2019-08-17 DIAGNOSIS — C329 Malignant neoplasm of larynx, unspecified: Secondary | ICD-10-CM | POA: Diagnosis not present

## 2019-08-17 DIAGNOSIS — R69 Illness, unspecified: Secondary | ICD-10-CM | POA: Diagnosis not present

## 2019-08-17 DIAGNOSIS — I2510 Atherosclerotic heart disease of native coronary artery without angina pectoris: Secondary | ICD-10-CM | POA: Diagnosis not present

## 2019-08-17 DIAGNOSIS — Z483 Aftercare following surgery for neoplasm: Secondary | ICD-10-CM | POA: Diagnosis not present

## 2019-08-17 DIAGNOSIS — E892 Postprocedural hypoparathyroidism: Secondary | ICD-10-CM | POA: Diagnosis not present

## 2019-08-17 DIAGNOSIS — R1310 Dysphagia, unspecified: Secondary | ICD-10-CM | POA: Diagnosis not present

## 2019-08-17 DIAGNOSIS — Z931 Gastrostomy status: Secondary | ICD-10-CM | POA: Diagnosis not present

## 2019-08-18 DIAGNOSIS — Z430 Encounter for attention to tracheostomy: Secondary | ICD-10-CM | POA: Diagnosis not present

## 2019-08-22 DIAGNOSIS — C321 Malignant neoplasm of supraglottis: Secondary | ICD-10-CM | POA: Diagnosis not present

## 2019-08-22 DIAGNOSIS — R633 Feeding difficulties: Secondary | ICD-10-CM | POA: Diagnosis not present

## 2019-08-24 DIAGNOSIS — Z930 Tracheostomy status: Secondary | ICD-10-CM | POA: Diagnosis not present

## 2019-08-24 DIAGNOSIS — R69 Illness, unspecified: Secondary | ICD-10-CM | POA: Diagnosis not present

## 2019-08-24 DIAGNOSIS — Z483 Aftercare following surgery for neoplasm: Secondary | ICD-10-CM | POA: Diagnosis not present

## 2019-08-24 DIAGNOSIS — C329 Malignant neoplasm of larynx, unspecified: Secondary | ICD-10-CM | POA: Diagnosis not present

## 2019-08-24 DIAGNOSIS — R1310 Dysphagia, unspecified: Secondary | ICD-10-CM | POA: Diagnosis not present

## 2019-08-24 DIAGNOSIS — I2510 Atherosclerotic heart disease of native coronary artery without angina pectoris: Secondary | ICD-10-CM | POA: Diagnosis not present

## 2019-08-24 DIAGNOSIS — I959 Hypotension, unspecified: Secondary | ICD-10-CM | POA: Diagnosis not present

## 2019-08-24 DIAGNOSIS — Z931 Gastrostomy status: Secondary | ICD-10-CM | POA: Diagnosis not present

## 2019-08-24 DIAGNOSIS — E892 Postprocedural hypoparathyroidism: Secondary | ICD-10-CM | POA: Diagnosis not present

## 2019-08-25 DIAGNOSIS — I959 Hypotension, unspecified: Secondary | ICD-10-CM | POA: Diagnosis not present

## 2019-08-25 DIAGNOSIS — Z483 Aftercare following surgery for neoplasm: Secondary | ICD-10-CM | POA: Diagnosis not present

## 2019-08-25 DIAGNOSIS — R1310 Dysphagia, unspecified: Secondary | ICD-10-CM | POA: Diagnosis not present

## 2019-08-25 DIAGNOSIS — C329 Malignant neoplasm of larynx, unspecified: Secondary | ICD-10-CM | POA: Diagnosis not present

## 2019-08-25 DIAGNOSIS — I2510 Atherosclerotic heart disease of native coronary artery without angina pectoris: Secondary | ICD-10-CM | POA: Diagnosis not present

## 2019-08-25 DIAGNOSIS — E892 Postprocedural hypoparathyroidism: Secondary | ICD-10-CM | POA: Diagnosis not present

## 2019-08-25 DIAGNOSIS — Z931 Gastrostomy status: Secondary | ICD-10-CM | POA: Diagnosis not present

## 2019-08-25 DIAGNOSIS — Z930 Tracheostomy status: Secondary | ICD-10-CM | POA: Diagnosis not present

## 2019-08-25 DIAGNOSIS — R69 Illness, unspecified: Secondary | ICD-10-CM | POA: Diagnosis not present

## 2019-08-26 DIAGNOSIS — Z931 Gastrostomy status: Secondary | ICD-10-CM | POA: Diagnosis not present

## 2019-08-26 DIAGNOSIS — I2510 Atherosclerotic heart disease of native coronary artery without angina pectoris: Secondary | ICD-10-CM | POA: Diagnosis not present

## 2019-08-26 DIAGNOSIS — E892 Postprocedural hypoparathyroidism: Secondary | ICD-10-CM | POA: Diagnosis not present

## 2019-08-26 DIAGNOSIS — R1310 Dysphagia, unspecified: Secondary | ICD-10-CM | POA: Diagnosis not present

## 2019-08-26 DIAGNOSIS — Z483 Aftercare following surgery for neoplasm: Secondary | ICD-10-CM | POA: Diagnosis not present

## 2019-08-26 DIAGNOSIS — R69 Illness, unspecified: Secondary | ICD-10-CM | POA: Diagnosis not present

## 2019-08-26 DIAGNOSIS — Z930 Tracheostomy status: Secondary | ICD-10-CM | POA: Diagnosis not present

## 2019-08-26 DIAGNOSIS — I959 Hypotension, unspecified: Secondary | ICD-10-CM | POA: Diagnosis not present

## 2019-08-26 DIAGNOSIS — C329 Malignant neoplasm of larynx, unspecified: Secondary | ICD-10-CM | POA: Diagnosis not present

## 2019-08-30 DIAGNOSIS — R490 Dysphonia: Secondary | ICD-10-CM | POA: Diagnosis not present

## 2019-08-30 DIAGNOSIS — Z87891 Personal history of nicotine dependence: Secondary | ICD-10-CM | POA: Diagnosis not present

## 2019-08-30 DIAGNOSIS — Z483 Aftercare following surgery for neoplasm: Secondary | ICD-10-CM | POA: Diagnosis not present

## 2019-08-30 DIAGNOSIS — Z930 Tracheostomy status: Secondary | ICD-10-CM | POA: Diagnosis not present

## 2019-08-30 DIAGNOSIS — I672 Cerebral atherosclerosis: Secondary | ICD-10-CM | POA: Diagnosis not present

## 2019-08-30 DIAGNOSIS — C329 Malignant neoplasm of larynx, unspecified: Secondary | ICD-10-CM | POA: Diagnosis not present

## 2019-08-31 DIAGNOSIS — Z430 Encounter for attention to tracheostomy: Secondary | ICD-10-CM | POA: Diagnosis not present

## 2019-08-31 DIAGNOSIS — R69 Illness, unspecified: Secondary | ICD-10-CM | POA: Diagnosis not present

## 2019-08-31 DIAGNOSIS — C329 Malignant neoplasm of larynx, unspecified: Secondary | ICD-10-CM | POA: Diagnosis not present

## 2019-08-31 DIAGNOSIS — R1310 Dysphagia, unspecified: Secondary | ICD-10-CM | POA: Diagnosis not present

## 2019-08-31 DIAGNOSIS — Z930 Tracheostomy status: Secondary | ICD-10-CM | POA: Diagnosis not present

## 2019-08-31 DIAGNOSIS — E892 Postprocedural hypoparathyroidism: Secondary | ICD-10-CM | POA: Diagnosis not present

## 2019-08-31 DIAGNOSIS — I2510 Atherosclerotic heart disease of native coronary artery without angina pectoris: Secondary | ICD-10-CM | POA: Diagnosis not present

## 2019-08-31 DIAGNOSIS — Z483 Aftercare following surgery for neoplasm: Secondary | ICD-10-CM | POA: Diagnosis not present

## 2019-08-31 DIAGNOSIS — Z931 Gastrostomy status: Secondary | ICD-10-CM | POA: Diagnosis not present

## 2019-08-31 DIAGNOSIS — I959 Hypotension, unspecified: Secondary | ICD-10-CM | POA: Diagnosis not present

## 2019-09-01 DIAGNOSIS — Z483 Aftercare following surgery for neoplasm: Secondary | ICD-10-CM | POA: Diagnosis not present

## 2019-09-01 DIAGNOSIS — C329 Malignant neoplasm of larynx, unspecified: Secondary | ICD-10-CM | POA: Diagnosis not present

## 2019-09-01 DIAGNOSIS — R1310 Dysphagia, unspecified: Secondary | ICD-10-CM | POA: Diagnosis not present

## 2019-09-01 DIAGNOSIS — I2510 Atherosclerotic heart disease of native coronary artery without angina pectoris: Secondary | ICD-10-CM | POA: Diagnosis not present

## 2019-09-01 DIAGNOSIS — I959 Hypotension, unspecified: Secondary | ICD-10-CM | POA: Diagnosis not present

## 2019-09-01 DIAGNOSIS — Z931 Gastrostomy status: Secondary | ICD-10-CM | POA: Diagnosis not present

## 2019-09-01 DIAGNOSIS — R69 Illness, unspecified: Secondary | ICD-10-CM | POA: Diagnosis not present

## 2019-09-01 DIAGNOSIS — Z930 Tracheostomy status: Secondary | ICD-10-CM | POA: Diagnosis not present

## 2019-09-01 DIAGNOSIS — E892 Postprocedural hypoparathyroidism: Secondary | ICD-10-CM | POA: Diagnosis not present

## 2019-09-02 DIAGNOSIS — R1310 Dysphagia, unspecified: Secondary | ICD-10-CM | POA: Diagnosis not present

## 2019-09-02 DIAGNOSIS — R69 Illness, unspecified: Secondary | ICD-10-CM | POA: Diagnosis not present

## 2019-09-02 DIAGNOSIS — C329 Malignant neoplasm of larynx, unspecified: Secondary | ICD-10-CM | POA: Diagnosis not present

## 2019-09-02 DIAGNOSIS — Z930 Tracheostomy status: Secondary | ICD-10-CM | POA: Diagnosis not present

## 2019-09-02 DIAGNOSIS — Z483 Aftercare following surgery for neoplasm: Secondary | ICD-10-CM | POA: Diagnosis not present

## 2019-09-02 DIAGNOSIS — I2510 Atherosclerotic heart disease of native coronary artery without angina pectoris: Secondary | ICD-10-CM | POA: Diagnosis not present

## 2019-09-02 DIAGNOSIS — I959 Hypotension, unspecified: Secondary | ICD-10-CM | POA: Diagnosis not present

## 2019-09-02 DIAGNOSIS — Z931 Gastrostomy status: Secondary | ICD-10-CM | POA: Diagnosis not present

## 2019-09-02 DIAGNOSIS — E892 Postprocedural hypoparathyroidism: Secondary | ICD-10-CM | POA: Diagnosis not present

## 2019-09-08 DIAGNOSIS — I959 Hypotension, unspecified: Secondary | ICD-10-CM | POA: Diagnosis not present

## 2019-09-08 DIAGNOSIS — Z483 Aftercare following surgery for neoplasm: Secondary | ICD-10-CM | POA: Diagnosis not present

## 2019-09-08 DIAGNOSIS — Z931 Gastrostomy status: Secondary | ICD-10-CM | POA: Diagnosis not present

## 2019-09-08 DIAGNOSIS — I2510 Atherosclerotic heart disease of native coronary artery without angina pectoris: Secondary | ICD-10-CM | POA: Diagnosis not present

## 2019-09-08 DIAGNOSIS — Z930 Tracheostomy status: Secondary | ICD-10-CM | POA: Diagnosis not present

## 2019-09-08 DIAGNOSIS — C329 Malignant neoplasm of larynx, unspecified: Secondary | ICD-10-CM | POA: Diagnosis not present

## 2019-09-08 DIAGNOSIS — Z430 Encounter for attention to tracheostomy: Secondary | ICD-10-CM | POA: Diagnosis not present

## 2019-09-08 DIAGNOSIS — R69 Illness, unspecified: Secondary | ICD-10-CM | POA: Diagnosis not present

## 2019-09-08 DIAGNOSIS — R1310 Dysphagia, unspecified: Secondary | ICD-10-CM | POA: Diagnosis not present

## 2019-09-08 DIAGNOSIS — J387 Other diseases of larynx: Secondary | ICD-10-CM | POA: Diagnosis not present

## 2019-09-08 DIAGNOSIS — J383 Other diseases of vocal cords: Secondary | ICD-10-CM | POA: Diagnosis not present

## 2019-09-08 DIAGNOSIS — E892 Postprocedural hypoparathyroidism: Secondary | ICD-10-CM | POA: Diagnosis not present

## 2019-09-09 DIAGNOSIS — Z931 Gastrostomy status: Secondary | ICD-10-CM | POA: Diagnosis not present

## 2019-09-09 DIAGNOSIS — I959 Hypotension, unspecified: Secondary | ICD-10-CM | POA: Diagnosis not present

## 2019-09-09 DIAGNOSIS — Z930 Tracheostomy status: Secondary | ICD-10-CM | POA: Diagnosis not present

## 2019-09-09 DIAGNOSIS — E892 Postprocedural hypoparathyroidism: Secondary | ICD-10-CM | POA: Diagnosis not present

## 2019-09-09 DIAGNOSIS — R69 Illness, unspecified: Secondary | ICD-10-CM | POA: Diagnosis not present

## 2019-09-09 DIAGNOSIS — Z483 Aftercare following surgery for neoplasm: Secondary | ICD-10-CM | POA: Diagnosis not present

## 2019-09-09 DIAGNOSIS — R1310 Dysphagia, unspecified: Secondary | ICD-10-CM | POA: Diagnosis not present

## 2019-09-09 DIAGNOSIS — I2510 Atherosclerotic heart disease of native coronary artery without angina pectoris: Secondary | ICD-10-CM | POA: Diagnosis not present

## 2019-09-09 DIAGNOSIS — C329 Malignant neoplasm of larynx, unspecified: Secondary | ICD-10-CM | POA: Diagnosis not present

## 2019-09-12 DIAGNOSIS — Z930 Tracheostomy status: Secondary | ICD-10-CM | POA: Diagnosis not present

## 2019-09-12 DIAGNOSIS — I959 Hypotension, unspecified: Secondary | ICD-10-CM | POA: Diagnosis not present

## 2019-09-12 DIAGNOSIS — R1310 Dysphagia, unspecified: Secondary | ICD-10-CM | POA: Diagnosis not present

## 2019-09-12 DIAGNOSIS — C329 Malignant neoplasm of larynx, unspecified: Secondary | ICD-10-CM | POA: Diagnosis not present

## 2019-09-12 DIAGNOSIS — Z483 Aftercare following surgery for neoplasm: Secondary | ICD-10-CM | POA: Diagnosis not present

## 2019-09-12 DIAGNOSIS — R69 Illness, unspecified: Secondary | ICD-10-CM | POA: Diagnosis not present

## 2019-09-12 DIAGNOSIS — I2510 Atherosclerotic heart disease of native coronary artery without angina pectoris: Secondary | ICD-10-CM | POA: Diagnosis not present

## 2019-09-12 DIAGNOSIS — E892 Postprocedural hypoparathyroidism: Secondary | ICD-10-CM | POA: Diagnosis not present

## 2019-09-12 DIAGNOSIS — Z931 Gastrostomy status: Secondary | ICD-10-CM | POA: Diagnosis not present

## 2019-09-20 ENCOUNTER — Encounter: Payer: Self-pay | Admitting: Endocrinology

## 2019-09-20 DIAGNOSIS — Z933 Colostomy status: Secondary | ICD-10-CM | POA: Diagnosis not present

## 2019-09-20 DIAGNOSIS — Z9002 Acquired absence of larynx: Secondary | ICD-10-CM | POA: Diagnosis not present

## 2019-09-20 DIAGNOSIS — Z8521 Personal history of malignant neoplasm of larynx: Secondary | ICD-10-CM | POA: Diagnosis not present

## 2019-09-20 DIAGNOSIS — Z79899 Other long term (current) drug therapy: Secondary | ICD-10-CM | POA: Diagnosis not present

## 2019-09-20 DIAGNOSIS — Z87891 Personal history of nicotine dependence: Secondary | ICD-10-CM | POA: Diagnosis not present

## 2019-09-20 DIAGNOSIS — J3489 Other specified disorders of nose and nasal sinuses: Secondary | ICD-10-CM | POA: Diagnosis not present

## 2019-09-20 DIAGNOSIS — Z6823 Body mass index (BMI) 23.0-23.9, adult: Secondary | ICD-10-CM | POA: Diagnosis not present

## 2019-09-20 DIAGNOSIS — Z4889 Encounter for other specified surgical aftercare: Secondary | ICD-10-CM | POA: Diagnosis not present

## 2019-09-20 DIAGNOSIS — E559 Vitamin D deficiency, unspecified: Secondary | ICD-10-CM | POA: Diagnosis not present

## 2019-09-20 DIAGNOSIS — E781 Pure hyperglyceridemia: Secondary | ICD-10-CM | POA: Diagnosis not present

## 2019-09-20 DIAGNOSIS — M549 Dorsalgia, unspecified: Secondary | ICD-10-CM | POA: Diagnosis not present

## 2019-09-20 DIAGNOSIS — Z930 Tracheostomy status: Secondary | ICD-10-CM | POA: Diagnosis not present

## 2019-09-20 DIAGNOSIS — Z430 Encounter for attention to tracheostomy: Secondary | ICD-10-CM | POA: Diagnosis not present

## 2019-09-20 DIAGNOSIS — E039 Hypothyroidism, unspecified: Secondary | ICD-10-CM | POA: Diagnosis not present

## 2019-09-20 DIAGNOSIS — E892 Postprocedural hypoparathyroidism: Secondary | ICD-10-CM | POA: Diagnosis not present

## 2019-09-20 DIAGNOSIS — R0689 Other abnormalities of breathing: Secondary | ICD-10-CM | POA: Diagnosis not present

## 2019-09-29 DIAGNOSIS — C321 Malignant neoplasm of supraglottis: Secondary | ICD-10-CM | POA: Diagnosis not present

## 2019-09-29 DIAGNOSIS — R633 Feeding difficulties: Secondary | ICD-10-CM | POA: Diagnosis not present

## 2019-10-04 DIAGNOSIS — Z931 Gastrostomy status: Secondary | ICD-10-CM | POA: Diagnosis not present

## 2019-10-04 DIAGNOSIS — T8149XA Infection following a procedure, other surgical site, initial encounter: Secondary | ICD-10-CM | POA: Diagnosis not present

## 2019-10-04 DIAGNOSIS — C329 Malignant neoplasm of larynx, unspecified: Secondary | ICD-10-CM | POA: Diagnosis not present

## 2019-10-04 DIAGNOSIS — Z8521 Personal history of malignant neoplasm of larynx: Secondary | ICD-10-CM | POA: Diagnosis not present

## 2019-10-04 DIAGNOSIS — Z87891 Personal history of nicotine dependence: Secondary | ICD-10-CM | POA: Diagnosis not present

## 2019-10-04 DIAGNOSIS — Z430 Encounter for attention to tracheostomy: Secondary | ICD-10-CM | POA: Diagnosis not present

## 2019-10-04 DIAGNOSIS — Z930 Tracheostomy status: Secondary | ICD-10-CM | POA: Diagnosis not present

## 2019-10-04 DIAGNOSIS — R490 Dysphonia: Secondary | ICD-10-CM | POA: Diagnosis not present

## 2019-10-07 DIAGNOSIS — Z430 Encounter for attention to tracheostomy: Secondary | ICD-10-CM | POA: Diagnosis not present

## 2019-10-09 DIAGNOSIS — J383 Other diseases of vocal cords: Secondary | ICD-10-CM | POA: Diagnosis not present

## 2019-10-09 DIAGNOSIS — Z430 Encounter for attention to tracheostomy: Secondary | ICD-10-CM | POA: Diagnosis not present

## 2019-10-09 DIAGNOSIS — J387 Other diseases of larynx: Secondary | ICD-10-CM | POA: Diagnosis not present

## 2019-10-12 DIAGNOSIS — I2510 Atherosclerotic heart disease of native coronary artery without angina pectoris: Secondary | ICD-10-CM | POA: Diagnosis not present

## 2019-10-12 DIAGNOSIS — E892 Postprocedural hypoparathyroidism: Secondary | ICD-10-CM | POA: Diagnosis not present

## 2019-10-12 DIAGNOSIS — R69 Illness, unspecified: Secondary | ICD-10-CM | POA: Diagnosis not present

## 2019-10-12 DIAGNOSIS — C329 Malignant neoplasm of larynx, unspecified: Secondary | ICD-10-CM | POA: Diagnosis not present

## 2019-10-12 DIAGNOSIS — Z483 Aftercare following surgery for neoplasm: Secondary | ICD-10-CM | POA: Diagnosis not present

## 2019-10-12 DIAGNOSIS — Z931 Gastrostomy status: Secondary | ICD-10-CM | POA: Diagnosis not present

## 2019-10-12 DIAGNOSIS — I959 Hypotension, unspecified: Secondary | ICD-10-CM | POA: Diagnosis not present

## 2019-10-12 DIAGNOSIS — R1310 Dysphagia, unspecified: Secondary | ICD-10-CM | POA: Diagnosis not present

## 2019-10-12 DIAGNOSIS — Z930 Tracheostomy status: Secondary | ICD-10-CM | POA: Diagnosis not present

## 2019-10-14 DIAGNOSIS — I2510 Atherosclerotic heart disease of native coronary artery without angina pectoris: Secondary | ICD-10-CM | POA: Diagnosis not present

## 2019-10-14 DIAGNOSIS — R69 Illness, unspecified: Secondary | ICD-10-CM | POA: Diagnosis not present

## 2019-10-14 DIAGNOSIS — Z930 Tracheostomy status: Secondary | ICD-10-CM | POA: Diagnosis not present

## 2019-10-14 DIAGNOSIS — C329 Malignant neoplasm of larynx, unspecified: Secondary | ICD-10-CM | POA: Diagnosis not present

## 2019-10-14 DIAGNOSIS — R1310 Dysphagia, unspecified: Secondary | ICD-10-CM | POA: Diagnosis not present

## 2019-10-14 DIAGNOSIS — Z931 Gastrostomy status: Secondary | ICD-10-CM | POA: Diagnosis not present

## 2019-10-14 DIAGNOSIS — E892 Postprocedural hypoparathyroidism: Secondary | ICD-10-CM | POA: Diagnosis not present

## 2019-10-14 DIAGNOSIS — Z483 Aftercare following surgery for neoplasm: Secondary | ICD-10-CM | POA: Diagnosis not present

## 2019-10-14 DIAGNOSIS — I959 Hypotension, unspecified: Secondary | ICD-10-CM | POA: Diagnosis not present

## 2019-10-15 DIAGNOSIS — Z9189 Other specified personal risk factors, not elsewhere classified: Secondary | ICD-10-CM | POA: Diagnosis not present

## 2019-10-15 DIAGNOSIS — J0410 Acute tracheitis without obstruction: Secondary | ICD-10-CM | POA: Diagnosis not present

## 2019-10-21 DIAGNOSIS — I959 Hypotension, unspecified: Secondary | ICD-10-CM | POA: Diagnosis not present

## 2019-10-21 DIAGNOSIS — E892 Postprocedural hypoparathyroidism: Secondary | ICD-10-CM | POA: Diagnosis not present

## 2019-10-21 DIAGNOSIS — Z483 Aftercare following surgery for neoplasm: Secondary | ICD-10-CM | POA: Diagnosis not present

## 2019-10-21 DIAGNOSIS — Z930 Tracheostomy status: Secondary | ICD-10-CM | POA: Diagnosis not present

## 2019-10-21 DIAGNOSIS — R1310 Dysphagia, unspecified: Secondary | ICD-10-CM | POA: Diagnosis not present

## 2019-10-21 DIAGNOSIS — R69 Illness, unspecified: Secondary | ICD-10-CM | POA: Diagnosis not present

## 2019-10-21 DIAGNOSIS — C329 Malignant neoplasm of larynx, unspecified: Secondary | ICD-10-CM | POA: Diagnosis not present

## 2019-10-21 DIAGNOSIS — I2510 Atherosclerotic heart disease of native coronary artery without angina pectoris: Secondary | ICD-10-CM | POA: Diagnosis not present

## 2019-10-21 DIAGNOSIS — Z931 Gastrostomy status: Secondary | ICD-10-CM | POA: Diagnosis not present

## 2019-10-22 ENCOUNTER — Ambulatory Visit
Admission: RE | Admit: 2019-10-22 | Discharge: 2019-10-22 | Disposition: A | Discharge status: Home / Self Care | Payer: Medicare HMO | Source: Ambulatory Visit | Attending: Radiation Oncology | Admitting: Radiation Oncology

## 2019-10-22 ENCOUNTER — Encounter: Payer: Self-pay | Admitting: Radiation Oncology

## 2019-10-22 DIAGNOSIS — Z08 Encounter for follow-up examination after completed treatment for malignant neoplasm: Secondary | ICD-10-CM | POA: Diagnosis not present

## 2019-10-22 DIAGNOSIS — C321 Malignant neoplasm of supraglottis: Secondary | ICD-10-CM | POA: Diagnosis not present

## 2019-10-22 NOTE — Progress Notes (Signed)
Radiation Oncology         [phone] ________________________________  Name: [name] [name] MRN: [id number]  Date: 10/22/2019  DOB: 1951-02-17  Follow-Up Visit Note by telephone as patient was unable to access MyChart video during pandemic precautions   CC: Philmore Pali, NP  Edgar Frisk, MD  Diagnosis and Prior Radiotherapy:       ICD-10-CM   1. Malignant neoplasm of supraglottis ([place])  C32.1     02/11/2019 through 04/06/2019 Site Technique Total Dose Dose per Fx Completed Fx Beam Energies  Head & neck: HN_larynx; Bilateral neck nodes and upper mediastinum included. IMRT 70/70 2 35/35 6X    CHIEF COMPLAINT:  Here for follow-up and surveillance of laryngeal cancer  Narrative:  The patient returns today for routine follow-up.  The patient and her partner were present on the call.  Her partner provided the verbal history.  Since treatment completion, she saw Dr. Erik Obey for follow up on 05/11/2019, who noted evidence for probable persistent/recurrent cancer on laryngoscopy. Neck CT performed on 06/03/2019 showed: bulky laryngeal tumor difficult to evaluate due to edema from radiation but overall improving appearance; no definite progressive disease.   She was referred to Dr. Conley Canal on 06/23/2019. She first underwent direct laryngoscopy with biopsies on 07/15/2019. Pathology revealed well differentiated, keratinizing, invasive squamous cell carcinoma to all samples, from bilateral tonsils, tongue base, and posterior pharyngeal wall.  She was then taken to salvage total laryngectomy, total glossectomy, total pharyngectomy, total thyroidectomy, cervical esophagectomy, and bilateral neck dissection. Pathology from the procedure revealed: tumor size of 6.9 cm, involving tongue, pharynx, larynx, tracheal cartilage, hyoid bone, and cervical esophagus; 2 of 24 lymph nodes positive for metastasis, without extranodal extension (2/24); perineural invasion identified (stage pT4b, pN2a).  She  last saw Dr. Conley Canal on 10/04/2019 and was noted to have a mild anterior tracheal cartilage infection with erythema extending onto her chest. She was prescribed Augmentin / Cephalexin, she reports. On Feb 11th PET is pending at [facility].  They will f/u with Dr. Conley Canal and SLP at [facility] after the PET. She is working on the swallowing and electrolarynx use. Able to drink water, using PEG. The team at [facility] bumped up her Levothyroxine dose to 200 mcg due to thyroid removal at surgery.  Today, she reports no significant lymphedema in the neck. She has neck stiffness. No dry mouth. She get home visits from PT. She is using a special mouthwash and brushing for dental hygiene. Using Prevident 5000. Not flossing. She hasn't seen a dentist since before her surgery.   ALLERGIES:  has No Known Allergies.  Meds: Current Outpatient Medications  Medication Sig Dispense Refill  . chlorhexidine (PERIDEX) 0.12 % solution Rinse with 15 mls twice daily for 30 seconds. Use after breakfast and at bedtime. Spit out excess. Do not swallow. (Patient taking differently: Use as directed 15 mLs in the mouth or throat 2 (two) times daily. Rinse with 15 mls twice daily for 30 seconds. Use after breakfast and at bedtime. Spit out excess. Do not swallow.) 480 mL prn  . docusate sodium (COLACE) 100 MG capsule Take 100 mg by mouth 2 (two) times daily.    Marland Kitchen levothyroxine (SYNTHROID) 175 MCG tablet Take 175 mcg by mouth every other day. Alternating with 150 mcg dose    . lidocaine (XYLOCAINE) 2 % solution Patient: Mix 1part 2% viscous lidocaine, 1part H20. Swallow 37mL of diluted mixture, 73min before meals and at bedtime, up to QID prn soreness 100  mL 5  . LORazepam (ATIVAN) 0.5 MG tablet Take 1 tablet (0.5 mg total) by mouth every 6 (six) hours as needed (Nausea or vomiting). 30 tablet 0  . Nutritional Supplements (KATE FARMS PEPTIDE 1.5) LIQD Take 4 Bottles by mouth daily. 325 mL 6  . sodium fluoride (PREVIDENT 5000 PLUS)  1.1 % CREA dental cream Apply cream to tooth brush. Brush teeth for 2 minutes. Spit out excess. DO NOT rinse afterwards. Repeat nightly. (Patient taking differently: Place 1 application onto teeth 2 (two) times a day. ) 1 Tube prn   No current facility-administered medications for this encounter.    Physical Findings:  N/A    Lab Findings: Lab Results  Component Value Date   WBC 10.1 07/07/2019   HGB 9.0 (L) 07/07/2019   HCT 29.0 (L) 07/07/2019   MCV 79.5 (L) 07/07/2019   PLT 376 07/07/2019    Lab Results  Component Value Date   TSH 2.125 07/07/2019    Radiographic Findings: No results found.  Impression/Plan:    1) Head and Neck Cancer Status: Status post salvage surgery.  Restaging PET pending for February 11 at [facility]  2) Nutritional Status: Using PEG tube, able to swallow water  3) Risk Factors: The patient has been educated about risk factors including alcohol and tobacco abuse; they understand that avoidance of alcohol and tobacco is important to prevent recurrences as well as other cancers  4) Swallowing: Following with speech-language pathology at [facility]  5) Dental: Encouraged to continue regular followup with dentistry, and dental hygiene including fluoride rinses.  Encouraged to floss when able and to continue using PreviDent 5000.  6) Thyroid function: Her thyroid function is now being followed and supplemented by her surgeons at [facility]. Lab Results  Component Value Date   TSH 2.125 07/07/2019    7) Other: She has not followed up with medical oncology recently.  I will ask for medical oncology to follow-up with her after her PET scan.  She also is a good candidate for following in our survivorship clinic.  The patient and her partner are interested in a survivorship post-treatment document "care plan" being made so that she can share it with her other physicians.  Such an appointment may be appropriate after she follows up with medical oncology  and completes her restaging imaging.  8) Follow-up in radiation oncology in 8 months. The patient was encouraged to call with any issues or questions before then.   This encounter was provided by telemedicine platform by telephone as patient was unable to access MyChart video during pandemic precautions The patient has given verbal consent for this type of encounter and has been advised to only accept a meeting of this type in a secure network environment. The time spent during this encounter in total, on date of service, was 45 minutes. The attendants for this meeting include Eppie Gibson  and Lillia Abed.  During the encounter, Eppie Gibson was located at [facility].  Lillia Abed was located at home.   _____________________________________   Eppie Gibson, MD  This document serves as a record of services personally performed by Eppie Gibson, MD. It was created on her behalf by Wilburn Mylar, a trained medical scribe. The creation of this record is based on the scribe's personal observations and the provider's statements to them. This document has been checked and approved by the attending provider.

## 2019-10-25 ENCOUNTER — Telehealth: Payer: Self-pay | Admitting: *Deleted

## 2019-10-25 NOTE — Telephone Encounter (Signed)
CALLED PATIENT TO INFORM OF FU WITH DR. Isidore Moos ON 06-21-20 @ 11AM, SPOKE WITH [name] AND SHE IS AWARE OF THIS APPT.

## 2019-10-26 DIAGNOSIS — H26493 Other secondary cataract, bilateral: Secondary | ICD-10-CM | POA: Diagnosis not present

## 2019-10-26 DIAGNOSIS — H35342 Macular cyst, hole, or pseudohole, left eye: Secondary | ICD-10-CM | POA: Diagnosis not present

## 2019-10-28 ENCOUNTER — Encounter (INDEPENDENT_AMBULATORY_CARE_PROVIDER_SITE_OTHER): Payer: Medicare HMO | Admitting: Ophthalmology

## 2019-10-28 DIAGNOSIS — H43813 Vitreous degeneration, bilateral: Secondary | ICD-10-CM | POA: Diagnosis not present

## 2019-10-28 DIAGNOSIS — H353131 Nonexudative age-related macular degeneration, bilateral, early dry stage: Secondary | ICD-10-CM | POA: Diagnosis not present

## 2019-10-28 DIAGNOSIS — H35373 Puckering of macula, bilateral: Secondary | ICD-10-CM | POA: Diagnosis not present

## 2019-10-28 DIAGNOSIS — H35342 Macular cyst, hole, or pseudohole, left eye: Secondary | ICD-10-CM

## 2019-10-29 DIAGNOSIS — I2510 Atherosclerotic heart disease of native coronary artery without angina pectoris: Secondary | ICD-10-CM | POA: Diagnosis not present

## 2019-10-29 DIAGNOSIS — R1310 Dysphagia, unspecified: Secondary | ICD-10-CM | POA: Diagnosis not present

## 2019-10-29 DIAGNOSIS — E892 Postprocedural hypoparathyroidism: Secondary | ICD-10-CM | POA: Diagnosis not present

## 2019-10-29 DIAGNOSIS — Z483 Aftercare following surgery for neoplasm: Secondary | ICD-10-CM | POA: Diagnosis not present

## 2019-10-29 DIAGNOSIS — Z930 Tracheostomy status: Secondary | ICD-10-CM | POA: Diagnosis not present

## 2019-10-29 DIAGNOSIS — Z931 Gastrostomy status: Secondary | ICD-10-CM | POA: Diagnosis not present

## 2019-10-29 DIAGNOSIS — I959 Hypotension, unspecified: Secondary | ICD-10-CM | POA: Diagnosis not present

## 2019-10-29 DIAGNOSIS — C329 Malignant neoplasm of larynx, unspecified: Secondary | ICD-10-CM | POA: Diagnosis not present

## 2019-10-29 DIAGNOSIS — R69 Illness, unspecified: Secondary | ICD-10-CM | POA: Diagnosis not present

## 2019-11-02 DIAGNOSIS — Z0100 Encounter for examination of eyes and vision without abnormal findings: Secondary | ICD-10-CM | POA: Diagnosis not present

## 2019-11-03 DIAGNOSIS — E892 Postprocedural hypoparathyroidism: Secondary | ICD-10-CM | POA: Diagnosis not present

## 2019-11-03 DIAGNOSIS — I2510 Atherosclerotic heart disease of native coronary artery without angina pectoris: Secondary | ICD-10-CM | POA: Diagnosis not present

## 2019-11-03 DIAGNOSIS — R1310 Dysphagia, unspecified: Secondary | ICD-10-CM | POA: Diagnosis not present

## 2019-11-03 DIAGNOSIS — Z930 Tracheostomy status: Secondary | ICD-10-CM | POA: Diagnosis not present

## 2019-11-03 DIAGNOSIS — I959 Hypotension, unspecified: Secondary | ICD-10-CM | POA: Diagnosis not present

## 2019-11-03 DIAGNOSIS — R69 Illness, unspecified: Secondary | ICD-10-CM | POA: Diagnosis not present

## 2019-11-03 DIAGNOSIS — Z483 Aftercare following surgery for neoplasm: Secondary | ICD-10-CM | POA: Diagnosis not present

## 2019-11-03 DIAGNOSIS — Z931 Gastrostomy status: Secondary | ICD-10-CM | POA: Diagnosis not present

## 2019-11-03 DIAGNOSIS — C329 Malignant neoplasm of larynx, unspecified: Secondary | ICD-10-CM | POA: Diagnosis not present

## 2019-11-04 ENCOUNTER — Ambulatory Visit
Admission: RE | Admit: 2019-11-04 | Discharge: 2019-11-04 | Disposition: A | Discharge status: Home / Self Care | Payer: Self-pay | Source: Ambulatory Visit | Attending: Radiation Oncology | Admitting: Radiation Oncology

## 2019-11-04 ENCOUNTER — Telehealth: Payer: Self-pay | Admitting: *Deleted

## 2019-11-04 ENCOUNTER — Other Ambulatory Visit: Payer: Self-pay | Admitting: *Deleted

## 2019-11-04 DIAGNOSIS — R930 Abnormal findings on diagnostic imaging of skull and head, not elsewhere classified: Secondary | ICD-10-CM | POA: Diagnosis not present

## 2019-11-04 DIAGNOSIS — I513 Intracardiac thrombosis, not elsewhere classified: Secondary | ICD-10-CM | POA: Diagnosis not present

## 2019-11-04 DIAGNOSIS — R918 Other nonspecific abnormal finding of lung field: Secondary | ICD-10-CM | POA: Diagnosis not present

## 2019-11-04 DIAGNOSIS — C328 Malignant neoplasm of overlapping sites of larynx: Secondary | ICD-10-CM | POA: Diagnosis not present

## 2019-11-04 DIAGNOSIS — C321 Malignant neoplasm of supraglottis: Secondary | ICD-10-CM

## 2019-11-04 DIAGNOSIS — C329 Malignant neoplasm of larynx, unspecified: Secondary | ICD-10-CM | POA: Diagnosis not present

## 2019-11-04 NOTE — Telephone Encounter (Signed)
Oncology Nurse Navigator Documentation  Faxed request to [facility] for the following imaging to be pushed to [employer]:  . 11/04/2019  PET Notification of successful fax transmission received.  Order(s) placed for imaging to be assigned to [facility].  Gayleen Orem, RN, BSN Head & Neck Oncology Nurse [place] [facility] [place] [phone]

## 2019-11-08 DIAGNOSIS — R948 Abnormal results of function studies of other organs and systems: Secondary | ICD-10-CM | POA: Diagnosis not present

## 2019-11-08 DIAGNOSIS — R490 Dysphonia: Secondary | ICD-10-CM | POA: Diagnosis not present

## 2019-11-08 DIAGNOSIS — Z9049 Acquired absence of other specified parts of digestive tract: Secondary | ICD-10-CM | POA: Diagnosis not present

## 2019-11-08 DIAGNOSIS — Z963 Presence of artificial larynx: Secondary | ICD-10-CM | POA: Diagnosis not present

## 2019-11-08 DIAGNOSIS — Z4589 Encounter for adjustment and management of other implanted devices: Secondary | ICD-10-CM | POA: Diagnosis not present

## 2019-11-08 DIAGNOSIS — R1314 Dysphagia, pharyngoesophageal phase: Secondary | ICD-10-CM | POA: Diagnosis not present

## 2019-11-09 DIAGNOSIS — J387 Other diseases of larynx: Secondary | ICD-10-CM | POA: Diagnosis not present

## 2019-11-09 DIAGNOSIS — J383 Other diseases of vocal cords: Secondary | ICD-10-CM | POA: Diagnosis not present

## 2019-11-09 DIAGNOSIS — Z430 Encounter for attention to tracheostomy: Secondary | ICD-10-CM | POA: Diagnosis not present

## 2019-11-11 DIAGNOSIS — Z930 Tracheostomy status: Secondary | ICD-10-CM | POA: Diagnosis not present

## 2019-11-11 DIAGNOSIS — Z931 Gastrostomy status: Secondary | ICD-10-CM | POA: Diagnosis not present

## 2019-11-11 DIAGNOSIS — R69 Illness, unspecified: Secondary | ICD-10-CM | POA: Diagnosis not present

## 2019-11-11 DIAGNOSIS — R1310 Dysphagia, unspecified: Secondary | ICD-10-CM | POA: Diagnosis not present

## 2019-11-11 DIAGNOSIS — Z483 Aftercare following surgery for neoplasm: Secondary | ICD-10-CM | POA: Diagnosis not present

## 2019-11-11 DIAGNOSIS — C329 Malignant neoplasm of larynx, unspecified: Secondary | ICD-10-CM | POA: Diagnosis not present

## 2019-11-11 DIAGNOSIS — E892 Postprocedural hypoparathyroidism: Secondary | ICD-10-CM | POA: Diagnosis not present

## 2019-11-11 DIAGNOSIS — I2510 Atherosclerotic heart disease of native coronary artery without angina pectoris: Secondary | ICD-10-CM | POA: Diagnosis not present

## 2019-11-11 DIAGNOSIS — I959 Hypotension, unspecified: Secondary | ICD-10-CM | POA: Diagnosis not present

## 2019-11-12 ENCOUNTER — Telehealth: Payer: Self-pay | Admitting: *Deleted

## 2019-11-12 DIAGNOSIS — C321 Malignant neoplasm of supraglottis: Secondary | ICD-10-CM | POA: Diagnosis not present

## 2019-11-12 DIAGNOSIS — R633 Feeding difficulties: Secondary | ICD-10-CM | POA: Diagnosis not present

## 2019-11-12 NOTE — Telephone Encounter (Signed)
Oncology Nurse Navigator Documentation  In follow-up to Wednesday's ENT Conference, spoke with Ms. Berenguer's partner, [name], to check on her well-being and to gauge plan/location for future treatments.  She indicated Nicholle has faired pretty well since her 07/20/2019 surgery at [facility] with Dr. Conley Canal.  She noted they had follow-up appointment with Dr. Conley Canal on Monday which included discussion of 2/11 PET that showed active disease.  Dr. Conley Canal indicated he will get back to them with recommendations for further tests/treatment s/p presentation of her case at next [facility] ENT Conference.   [name] stated Aleida does not want any additional surgery, they would prefer to pursue future treatments at [facility] if possible.  She agreed to call me next week with update after speaking with Dr. Conley Canal.  Gayleen Orem, RN, BSN Head & Neck Oncology Nurse [place] at [place] [phone]

## 2019-11-15 DIAGNOSIS — Z931 Gastrostomy status: Secondary | ICD-10-CM | POA: Diagnosis not present

## 2019-11-15 DIAGNOSIS — Z483 Aftercare following surgery for neoplasm: Secondary | ICD-10-CM | POA: Diagnosis not present

## 2019-11-15 DIAGNOSIS — I959 Hypotension, unspecified: Secondary | ICD-10-CM | POA: Diagnosis not present

## 2019-11-15 DIAGNOSIS — C329 Malignant neoplasm of larynx, unspecified: Secondary | ICD-10-CM | POA: Diagnosis not present

## 2019-11-15 DIAGNOSIS — Z930 Tracheostomy status: Secondary | ICD-10-CM | POA: Diagnosis not present

## 2019-11-15 DIAGNOSIS — R69 Illness, unspecified: Secondary | ICD-10-CM | POA: Diagnosis not present

## 2019-11-15 DIAGNOSIS — R1310 Dysphagia, unspecified: Secondary | ICD-10-CM | POA: Diagnosis not present

## 2019-11-15 DIAGNOSIS — I2510 Atherosclerotic heart disease of native coronary artery without angina pectoris: Secondary | ICD-10-CM | POA: Diagnosis not present

## 2019-11-15 DIAGNOSIS — E892 Postprocedural hypoparathyroidism: Secondary | ICD-10-CM | POA: Diagnosis not present

## 2019-11-15 NOTE — Telephone Encounter (Signed)
Please schedule the patient for labs, port flush and clinic appt at [facility] on 11/24/2019.  Thanks.  Dr. Maylon Peppers

## 2019-11-15 NOTE — Telephone Encounter (Signed)
Appts requested.

## 2019-11-15 NOTE — Telephone Encounter (Signed)
Thanks.  Dr. Maylon Peppers

## 2019-11-16 ENCOUNTER — Telehealth: Payer: Self-pay | Admitting: Hematology

## 2019-11-16 NOTE — Telephone Encounter (Signed)
Scheduled appt per 2/22 sch message - pt is aware of appt date and time

## 2019-11-17 DIAGNOSIS — Z430 Encounter for attention to tracheostomy: Secondary | ICD-10-CM | POA: Diagnosis not present

## 2019-11-24 ENCOUNTER — Inpatient Hospital Stay: Payer: Medicare HMO | Admitting: Hematology

## 2019-11-24 ENCOUNTER — Other Ambulatory Visit: Payer: Self-pay

## 2019-11-24 ENCOUNTER — Inpatient Hospital Stay: Payer: Medicare HMO

## 2019-11-24 ENCOUNTER — Inpatient Hospital Stay: Payer: Medicare HMO | Attending: Hematology

## 2019-11-24 ENCOUNTER — Encounter: Payer: Self-pay | Admitting: Hematology

## 2019-11-24 ENCOUNTER — Encounter: Payer: Self-pay | Admitting: *Deleted

## 2019-11-24 VITALS — BP 101/66 | HR 87 | Temp 98.7°F | Resp 17 | Ht 62.0 in | Wt 121.9 lb

## 2019-11-24 DIAGNOSIS — T451X5A Adverse effect of antineoplastic and immunosuppressive drugs, initial encounter: Secondary | ICD-10-CM | POA: Diagnosis not present

## 2019-11-24 DIAGNOSIS — Z483 Aftercare following surgery for neoplasm: Secondary | ICD-10-CM | POA: Diagnosis not present

## 2019-11-24 DIAGNOSIS — Z79899 Other long term (current) drug therapy: Secondary | ICD-10-CM | POA: Insufficient documentation

## 2019-11-24 DIAGNOSIS — G893 Neoplasm related pain (acute) (chronic): Secondary | ICD-10-CM | POA: Diagnosis not present

## 2019-11-24 DIAGNOSIS — E039 Hypothyroidism, unspecified: Secondary | ICD-10-CM

## 2019-11-24 DIAGNOSIS — Z7952 Long term (current) use of systemic steroids: Secondary | ICD-10-CM | POA: Diagnosis not present

## 2019-11-24 DIAGNOSIS — R1310 Dysphagia, unspecified: Secondary | ICD-10-CM | POA: Diagnosis not present

## 2019-11-24 DIAGNOSIS — I959 Hypotension, unspecified: Secondary | ICD-10-CM | POA: Diagnosis not present

## 2019-11-24 DIAGNOSIS — Z95828 Presence of other vascular implants and grafts: Secondary | ICD-10-CM

## 2019-11-24 DIAGNOSIS — C321 Malignant neoplasm of supraglottis: Secondary | ICD-10-CM

## 2019-11-24 DIAGNOSIS — E892 Postprocedural hypoparathyroidism: Secondary | ICD-10-CM | POA: Diagnosis not present

## 2019-11-24 DIAGNOSIS — R69 Illness, unspecified: Secondary | ICD-10-CM | POA: Diagnosis not present

## 2019-11-24 DIAGNOSIS — E46 Unspecified protein-calorie malnutrition: Secondary | ICD-10-CM

## 2019-11-24 DIAGNOSIS — D6481 Anemia due to antineoplastic chemotherapy: Secondary | ICD-10-CM | POA: Insufficient documentation

## 2019-11-24 DIAGNOSIS — M25512 Pain in left shoulder: Secondary | ICD-10-CM

## 2019-11-24 DIAGNOSIS — Z931 Gastrostomy status: Secondary | ICD-10-CM | POA: Diagnosis not present

## 2019-11-24 DIAGNOSIS — Z5112 Encounter for antineoplastic immunotherapy: Secondary | ICD-10-CM | POA: Diagnosis present

## 2019-11-24 DIAGNOSIS — Z930 Tracheostomy status: Secondary | ICD-10-CM | POA: Diagnosis not present

## 2019-11-24 DIAGNOSIS — Z7189 Other specified counseling: Secondary | ICD-10-CM

## 2019-11-24 DIAGNOSIS — C329 Malignant neoplasm of larynx, unspecified: Secondary | ICD-10-CM | POA: Diagnosis not present

## 2019-11-24 DIAGNOSIS — I2510 Atherosclerotic heart disease of native coronary artery without angina pectoris: Secondary | ICD-10-CM | POA: Diagnosis not present

## 2019-11-24 LAB — CMP (CANCER CENTER ONLY)
ALT: 14 U/L (ref 0–44)
AST: 19 U/L (ref 15–41)
Albumin: 3.7 g/dL (ref 3.5–5.0)
Alkaline Phosphatase: 73 U/L (ref 38–126)
Anion gap: 9 (ref 5–15)
BUN: 26 mg/dL — ABNORMAL HIGH (ref 8–23)
CO2: 27 mmol/L (ref 22–32)
Calcium: 9 mg/dL (ref 8.9–10.3)
Chloride: 103 mmol/L (ref 98–111)
Creatinine: 0.71 mg/dL (ref 0.44–1.00)
GFR, Est AFR Am: >60 mL/min (ref >60)
GFR, Estimated: >60 mL/min (ref >60)
Glucose, Bld: 91 mg/dL (ref 70–99)
Potassium: 4.2 mmol/L (ref 3.5–5.1)
Sodium: 139 mmol/L (ref 135–145)
Total Bilirubin: 0.3 mg/dL (ref 0.3–1.2)
Total Protein: 6.9 g/dL (ref 6.5–8.1)

## 2019-11-24 LAB — CBC WITH DIFFERENTIAL (CANCER CENTER ONLY)
Abs Immature Granulocytes: 0.01 10*3/uL (ref 0.00–0.07)
Basophils Absolute: 0 10*3/uL (ref 0.0–0.1)
Basophils Relative: 0 %
Eosinophils Absolute: 0.4 10*3/uL (ref 0.0–0.5)
Eosinophils Relative: 8 %
HCT: 34 % — ABNORMAL LOW (ref 36.0–46.0)
Hemoglobin: 11.2 g/dL — ABNORMAL LOW (ref 12.0–15.0)
Immature Granulocytes: 0 %
Lymphocytes Relative: 19 %
Lymphs Abs: 1.1 10*3/uL (ref 0.7–4.0)
MCH: 28.8 pg (ref 26.0–34.0)
MCHC: 32.9 g/dL (ref 30.0–36.0)
MCV: 87.4 fL (ref 80.0–100.0)
Monocytes Absolute: 0.6 10*3/uL (ref 0.1–1.0)
Monocytes Relative: 11 %
Neutro Abs: 3.5 10*3/uL (ref 1.7–7.7)
Neutrophils Relative %: 62 %
Platelet Count: 179 10*3/uL (ref 150–400)
RBC: 3.89 MIL/uL (ref 3.87–5.11)
RDW: 12.3 % (ref 11.5–15.5)
WBC Count: 5.7 10*3/uL (ref 4.0–10.5)
nRBC: 0 % (ref 0.0–0.2)

## 2019-11-24 MED ORDER — HYDROCODONE-ACETAMINOPHEN 5-325 MG PO TABS: 1.0000 | ORAL_TABLET | Freq: Three times a day (TID) | ORAL | 0 refills | Status: DC | PRN

## 2019-11-24 MED ORDER — HEPARIN SOD (PORK) LOCK FLUSH 100 UNIT/ML IV SOLN
500.0000 [IU] | Freq: Once | INTRAVENOUS | Status: AC | PRN
  Administered 2019-11-24: 500 [IU]
  Filled 2019-11-24: qty 5

## 2019-11-24 MED ORDER — SODIUM CHLORIDE 0.9% FLUSH
10.0000 mL | INTRAVENOUS | Status: DC | PRN
  Administered 2019-11-24: 10 mL
  Filled 2019-11-24: qty 10

## 2019-11-24 NOTE — Progress Notes (Signed)
[place] OFFICE PROGRESS NOTE  Patient Care Team: Philmore Pali, NP as PCP - General (Nurse Practitioner) Eppie Gibson, MD as Attending Physician (Radiation Oncology) Tish Men, MD as Consulting Physician (Hematology) Leota Sauers, RN as Oncology Nurse Navigator Karie Mainland, RD as Dietitian (Nutrition) Sharen Counter, CCC-SLP as Speech Language Pathologist (Speech Pathology)  HEME/ONC OVERVIEW: 1.  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  -11/2018-12/2018:   Large left supraglottic laryngeal mass w/ bilateral Level II cervical LN's (<1 cm) on CT; emergency tracheostomy, multiple bx'es non-diagnostic  Repeat DL with bx of the supraglottic mass, bx'ed; well-differentiated invasive squamous cell carcinoma   Locally advanced disease, including bilateral cervical LN involvement on PET; mildly FDG-avid mediastinal LN's, bx negative for malignancy -Mid-01/2019 - 03/2019: definitive chemoradiation with weekly cisplatin x 7 doses  Laryngoscopy suspicious for locally recurrent/residual disease in 05/2019  -06/2019: total laryngectomy, pharyngectomy, glossectomy, b/l modified neck dissection, tracheostomy and free lap; Dr. Conley Canal and [place]  - 10/2019:   FDG-avid hypodense lesion near the left hemimandible (SUV 10) w/ b/l neck lesions (max SUV 4), subcentimeter pulmonary nodules below PET detection, suspicious for mets; focal FDG uptake in the R ventricular cavity (SUV 8)  An extrinsic mass compressing the RV and RA, measuring 4.3 cm on TTE   2. Port and PEG in 01/2019   TREATMENT REGIMEN:  12/03/2018: tracheostomy, DL and bx at [facility]   02/11/2019 - 04/06/2019: definitive chemoRT with weekly cisplatin x 7 doses   07/20/2019: total laryngectomy, pharyngectomy, glossectomy, b/l modified neck dissection, and free lap  ASSESSMENT & PLAN:   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  with disease recurrence  -S/p definitive chemoRT, followed by salvage laryngectomy w/ bilateral neck dissection -The patient's case was reviewed extensively at the H&N tumor board, and I also independently reviewed the radiologic images of recent PET.  In summary, PET in 10/2019 showed FDG-avid hypodense lesion near the left hemimandible (SUV 10) w/ b/l neck lesions (max SUV 4), subcentimeter pulmonary nodules below PET detection, suspicious for mets.  In addition, there was a focal FDG uptake in the R ventricular cavity (SUV 8), etiology unclear.  -I discussed the imaging results in detail with the patient -Given the suspicious PET findings, we need pathologic confirmation of recurrent disease.  Therefore, I have ordered US-guided cervical LN biopsy.   -If malignancy is confirmed, then we will need to order PD-L1 and molecular studies in order to determine treatment options.  -Finally, I prescribed PRN Norco for left shoulder pain, likely due to nerve damage from recent surgery.  Normocytic anemia -Likely multifactorial, including bone marrow suppression from chemotherapy, blood loss from surgery, and anemia of chronic disease  -Hgb 11.2 today, stable  -Clinically, patient denies any symptoms of bleeding -We will monitor it for now  Protein malnutrition -Secondary to chemoradiation and recent surgery  -Weight stable  -Currently taking 4 cans of [employer] via the feeding tube, as well as 2 Boost and ~60 oz of water by mouth  -I encouraged patient to adhere to nutritional recommendations  Hypothyroidism -Currently on Synthroid 122mg -Continue follow-up with her endocrinologist   Goals of care discussion -I discussed with the patient and her son that if there is confirmation of recurrent disease, then the likelihood of cure after failing chemoradiation and salvage surgery is exceedingly small.  In that instance, the goal of treatment would be for palliative intent, not curative. -Patient  expressed understanding  Orders Placed This Encounter  Procedures  . [place] CORE BIOPSY (LYMPH NODES)    Standing Status:   Future    Standing Expiration Date:   01/23/2021    Order Specific Question:   Lab orders requested (DO NOT place separate lab orders, these will be automatically ordered during procedure specimen collection):    Answer:   Surgical Pathology    Order Specific Question:   Reason for Exam (SYMPTOM  OR DIAGNOSIS REQUIRED)    Answer:   Suspected recurrent laryngeal cancer. Need cervical LN biopsy. PET images available.    Order Specific Question:   Preferred location?    Answer:   [facility] [place]    The total time spent in the encounter was 45 minutes, including face-to-face time with the patient, review of various tests results, order additional studies/medications, documentation, and coordination of care plan.   All questions were answered. The patient knows to call the clinic with any problems, questions or concerns. No barriers to learning was detected.  Return in 2 weeks to follow up LN biopsy results.   Tish Men, MD 3/3/20214:21 PM  CHIEF COMPLAINT: "I am okay"  INTERVAL HISTORY: Ms. Colton returns to clinic for follow-up of recurrent laryngeal cancer.  Patient underwent salvage laryngectomy in 06/2019 after PET showed suspicious residual disease.  Pathology showed residual/recurrent laryngeal cancer.  Since the surgery, she has been administering several cans of [facility] via the feeding tube, as well as some liquid intake by mouth over time, but she is not taking in any solid food.  Due to the nerve damage and free flap, she has limited range of motion in the left shoulder.  Unfortunately, during her most recent follow-up with ENT in 10/2019, PET showed suspicious recurrent disease near the left hemimandible, as well as bilateral cervical LN's.  She has not yet had any biopsy for pathologic confirmation.  She also has chronic left shoulder pain since the surgery,  but only takes PRN ibuprofen with minimal pain relief.  She denies any other complaint today.   REVIEW OF SYSTEMS:   Constitutional: ( - ) fevers, ( - )  chills , ( - ) night sweats Eyes: ( - ) blurriness of vision, ( - ) double vision, ( - ) watery eyes Ears, nose, mouth, throat, and face: ( - ) mucositis, ( - ) sore throat Respiratory: ( - ) cough, ( - ) dyspnea, ( - ) wheezes Cardiovascular: ( - ) palpitation, ( - ) chest discomfort, ( - ) lower extremity swelling Gastrointestinal:  ( - ) nausea, ( - ) heartburn, ( - ) change in bowel habits Skin: ( - ) abnormal skin rashes Lymphatics: ( - ) new lymphadenopathy, ( - ) easy bruising Neurological: ( - ) numbness, ( - ) tingling, ( - ) new weaknesses Behavioral/Psych: ( - ) mood change, ( - ) new changes  All other systems were reviewed with the patient and are negative.  SUMMARY OF ONCOLOGIC HISTORY: Oncology History  Malignant neoplasm of supraglottis ([place])  12/02/2018 Imaging   CT neck: IMPRESSION: 1. Supraglottic laryngeal carcinoma extending across the anterior commissure and invading the prelaryngeal fat, left aryepiglottic fold and left aspect of the epiglottis. 2. Severe narrowing of the laryngeal airway. 3. Bilateral subcentimeter level 2A and 2A cervical lymph nodes.  Critical Value/emergent results were called by telephone at the time of interpretation on 12/02/2018 at 5:34 pm to Dr. Collier Salina, who verbally acknowledged these results.  12/02/2018 Imaging   CT  Chest:  IMPRESSION: No evidence of metastatic disease in the chest.   12/03/2018 Pathology Results   (Care Everywhere; [facility]) A: Larynx, supraglottis, biopsy - Atypical squamous proliferation with at least low grade dysplasia and abundant keratinization - No definite high grade dysplasia or invasive carcinoma represented (see comment)   12/18/2018 Pathology Results   (Care Everywhere; [facility]) A:  Neck, left, fine needle aspiration - Rare  single atypical cells, no cells diagnostic of malignancy - Lymph node material present  B:  Neck, right, fine needle aspiration - Non-diagnostic specimen - No lymph node material identified - Mixed inflammation and blood   12/31/2018 Pathology Results   (Care Everywhere; [facility])  A: Larynx, left supraglottic mass, biopsy - Atypical squamous proliferation with abundant keratinization, suspicious for well-differentiated invasive squamous cell carcinoma  B: Larynx, left supraglottic mass, biopsy - Well-differentiated invasive squamous cell carcinoma with abundant keratinization (see comment)   01/18/2019 Initial Diagnosis   Laryngeal cancer ([place])   01/26/2019 Imaging   PET: IMPRESSION: 1. Locally advanced hypermetabolic laryngeal neoplasm centered in the left supraglottic space with bilateral glottic and epiglottic involvement and subglottic extension on the left. 2. Hypermetabolic bilateral level 3 neck nodal metastases. 3. Hypermetabolic high mediastinal nodal metastases between the trachea and upper thoracic esophagus. 4. Otherwise no distant hypermetabolic metastatic disease. 5. Nodular 7 mm focus at the left lung base, below PET resolution, recommend attention on follow-up chest CT in 3 months. 6. Chronic findings include: Aortic Atherosclerosis (ICD10-I70.0). Cholelithiasis.   02/08/2019 Cancer Staging   Staging form: Larynx - Supraglottis, AJCC 8th Edition - Clinical stage from 02/08/2019: Stage IVA (cT3, cN2c, cM0) - Signed by Eppie Gibson, MD on 02/08/2019   02/11/2019 - 04/07/2019 Chemotherapy   The patient had palonosetron (ALOXI) injection 0.25 mg, 0.25 mg, Intravenous,  Once, 7 of 7 cycles Administration: 0.25 mg (02/11/2019), 0.25 mg (02/18/2019), 0.25 mg (03/04/2019), 0.25 mg (03/11/2019), 0.25 mg (03/18/2019), 0.25 mg (03/25/2019), 0.25 mg (04/01/2019) CISplatin (PLATINOL) 72 mg in sodium chloride 0.9 % 250 mL chemo infusion, 40 mg/m2 = 72 mg, Intravenous,  Once, 7 of 7  cycles Administration: 72 mg (02/11/2019), 72 mg (02/18/2019), 72 mg (03/04/2019), 72 mg (03/11/2019), 72 mg (03/18/2019), 72 mg (03/25/2019), 72 mg (04/01/2019) fosaprepitant (EMEND) 150 mg, dexamethasone (DECADRON) 12 mg in sodium chloride 0.9 % 145 mL IVPB, , Intravenous,  Once, 7 of 7 cycles Administration:  (02/11/2019),  (02/18/2019),  (03/04/2019),  (03/11/2019),  (03/18/2019),  (03/25/2019),  (04/01/2019)  for chemotherapy treatment.    07/20/2019 Pathology Results   PROCEDURE: Total glossectomy, total pharyngectomy, total laryngectomy, total thyroidectomy, and cervical esophagectomy TUMOR SITE: Larynx TUMOR LATERALITY: Not specified TUMOR FOCALITY: Unifocal TUMOR SIZE:    GREATEST DIMENSION: 6.9 cm    ADDITIONAL DIMENSIONS: 5.4 x 2.4 cm HISTOLOGIC TYPE: Squamous cell carcinoma, conventional (keratinizing) HISTOLOGIC GRADE:  G1-G2: Well to moderately differentiated TUMOR EXTENSION: Tumor involves the posterior tongue, pharynx, larynx, tracheal cartilage, hyoid bone, cervical esophagus, vagus nerve, carotid artery, deep cervical fascia, ans surrounding skeletal muscle and soft tissue MARGINS: Uninvolved by invasive tumor; Perineural invasion is less than 18m from the left soft tissue margin LYMPHOVASCULAR INVASION: Not identified PERINEURAL INVASION:  Present REGIONAL LYMPH NODES:     NUMBER OF LYMPH NODES INVOLVED: 2     NUMBER OF LYMPH NODES EXAMINED: 24     LATERALITY OF LYMPH NODES INVOLVED: Cannot determine     SIZE OF LARGEST METASTATIC DEPOSIT: 0.8 cm  EXTRANODAL EXTENSION: Not identified PATHOLOGIC STAGE CLASSIFICATION (pTNM, AJCC 8TH Ed): (r)(y) pT4b pN2a DISTANT METASTASIS (pM): ADDITIONAL PATHOLOGIC FINDINGS: Previous chemoradiation   11/04/2019 Imaging   PET: 1.  Hypermetabolic bilateral neck lesions as detailed above are concerning for disease recurrence. 2.  Compared to 07/08/2019 PET scan, interval development of subcentimeter bilateral upper lobe pulmonary nodules, size  below PET resolution. These are concerning for metastasis. Consider diagnostic quality CT chest exam for further evaluation as per oncology treatment planning. 3.  Focal hypermetabolic uptake in right ventricular cavity of indeterminate significance. Recommend echocardiographic examination to rule out underlying thrombus. This finding was discussed with Dr. Conley Canal by Dr. Clementeen Graham and Dr. Marcello Moores on 11/04/2019 at approximately 3:00 PM via telephone. 4.  Ancillary CT findings as above.   11/05/2019 Imaging   Echocardiogram: Summary   1. The left ventricle is normal in size with normal wall thickness.   2. The left ventricular systolic function is normal, LVEF is visually estimated at 60-65%.   3. The right ventricle is normal in size, with normal systolic function.   4. There is an extrinsic compression of the RV and RA laterally at the AV groove by a mass of 4.3 cm diameter. Unclear if the mass is invasive, although no pericardial effusion seen suggestive no invasion possibly. There is no evidence of hemodynamic compromise secondary to cardiac compression with normal IVC diameter.     I have reviewed the past medical history, past surgical history, social history and family history with the patient and they are unchanged from previous note.  ALLERGIES:  has No Known Allergies.  MEDICATIONS:  Current Outpatient Medications  Medication Sig Dispense Refill  . chlorhexidine (PERIDEX) 0.12 % solution Rinse with 15 mls twice daily for 30 seconds. Use after breakfast and at bedtime. Spit out excess. Do not swallow. (Patient taking differently: Use as directed 15 mLs in the mouth or throat 2 (two) times daily. Rinse with 15 mls twice daily for 30 seconds. Use after breakfast and at bedtime. Spit out excess. Do not swallow.) 480 mL prn  . docusate sodium (COLACE) 100 MG capsule Take 100 mg by mouth 2 (two) times daily.    Marland Kitchen HYDROcodone-acetaminophen (NORCO) 5-325 MG tablet Take 1 tablet by mouth every  8 (eight) hours as needed for moderate pain. 60 tablet 0  . levothyroxine (SYNTHROID) 175 MCG tablet Take 200 mcg by mouth every morning.     . lidocaine (XYLOCAINE) 2 % solution Patient: Mix 1part 2% viscous lidocaine, 1part H20. Swallow 43m of diluted mixture, 393m before meals and at bedtime, up to QID prn soreness 100 mL 5  . LORazepam (ATIVAN) 0.5 MG tablet Take 1 tablet (0.5 mg total) by mouth every 6 (six) hours as needed (Nausea or vomiting). 30 tablet 0  . Nutritional Supplements (KATE FARMS PEPTIDE 1.5) LIQD Take 4 Bottles by mouth daily. 325 mL 6  . sodium fluoride (PREVIDENT 5000 PLUS) 1.1 % CREA dental cream Apply cream to tooth brush. Brush teeth for 2 minutes. Spit out excess. DO NOT rinse afterwards. Repeat nightly. (Patient taking differently: Place 1 application onto teeth 2 (two) times a day. ) 1 Tube prn   No current facility-administered medications for this visit.    PHYSICAL EXAMINATION: ECOG PERFORMANCE STATUS: 1 - Symptomatic but completely ambulatory  Today's Vitals   11/24/19 1517 11/24/19 1526  BP: 101/66   Pulse: 87   Resp: 17   Temp: 98.7 F (37.1 C)   TempSrc: Temporal   SpO2: 99%  Weight: 121 lb 14.4 oz (55.3 kg)   Height: 5' 2"  (1.575 m)   PainSc:  6    Body mass index is 22.3 kg/m.  Filed Weights   11/24/19 1517  Weight: 121 lb 14.4 oz (55.3 kg)    GENERAL: alert, no distress and comfortable, non-verbal due to laryngectomy  SKIN: left latissimus dorsi flap site healing well EYES: conjunctiva are pink and non-injected, sclera clear OROPHARYNX: no exudate, no erythema; lips, buccal mucosa, and tongue normal  NECK: supple, non-tender LYMPH:  no definite palpable lymphadenopathy in the cervical LUNGS: clear to auscultation with normal breathing effort HEART: regular rate & rhythm and no murmurs and no lower extremity edema ABDOMEN: soft, non-tender, non-distended, normal bowel sounds Musculoskeletal: no cyanosis of digits and no clubbing   PSYCH: alert, non-verbal  LABORATORY DATA:  I have reviewed the data as listed    Component Value Date/Time   NA 139 11/24/2019 1509   K 4.2 11/24/2019 1509   CL 103 11/24/2019 1509   CO2 27 11/24/2019 1509   GLUCOSE 91 11/24/2019 1509   BUN 26 (H) 11/24/2019 1509   CREATININE 0.71 11/24/2019 1509   CALCIUM 9.0 11/24/2019 1509   PROT 6.9 11/24/2019 1509   ALBUMIN 3.7 11/24/2019 1509   AST 19 11/24/2019 1509   ALT 14 11/24/2019 1509   ALKPHOS 73 11/24/2019 1509   BILITOT 0.3 11/24/2019 1509   GFRNONAA >60 11/24/2019 1509   GFRAA >60 11/24/2019 1509    No results found for: SPEP, UPEP  Lab Results  Component Value Date   WBC 5.7 11/24/2019   NEUTROABS 3.5 11/24/2019   HGB 11.2 (L) 11/24/2019   HCT 34.0 (L) 11/24/2019   MCV 87.4 11/24/2019   PLT 179 11/24/2019      Chemistry      Component Value Date/Time   NA 139 11/24/2019 1509   K 4.2 11/24/2019 1509   CL 103 11/24/2019 1509   CO2 27 11/24/2019 1509   BUN 26 (H) 11/24/2019 1509   CREATININE 0.71 11/24/2019 1509      Component Value Date/Time   CALCIUM 9.0 11/24/2019 1509   ALKPHOS 73 11/24/2019 1509   AST 19 11/24/2019 1509   ALT 14 11/24/2019 1509   BILITOT 0.3 11/24/2019 1509       RADIOGRAPHIC STUDIES: I have personally reviewed the radiological images as listed below and agreed with the findings in the report. No results found.

## 2019-11-25 ENCOUNTER — Telehealth: Payer: Self-pay | Admitting: Hematology

## 2019-11-25 LAB — TSH: TSH: 0.082 u[IU]/mL — ABNORMAL LOW (ref 0.308–3.960)

## 2019-11-25 NOTE — Telephone Encounter (Signed)
I talk with patient regarding schedule  

## 2019-11-25 NOTE — Progress Notes (Signed)
Oncology Nurse Navigator Documentation  Met with [name] [name] during Est Pt appointment with Dr. Maylon Peppers.  She was accompanied by her son. Assessed PEG per her reporting of "loose" and adapter concern.  Insertion site WNL, retention ring appoximately 2 cm above skin surface.  Tab on adapter plug broken off.  Tubing appeared stained by supplement.  I cinched retention ring closer to skin surface, replaced adapter (20 Fr).  She stated she is flushing PEG with 60 cc water before and after instillation of supplement. I encouraged them to contact me moving forward with needs/concerns.  Gayleen Orem, RN, BSN Head & Neck Oncology Nurse [place] [facility] [place] [phone]

## 2019-11-29 ENCOUNTER — Encounter (HOSPITAL_COMMUNITY): Payer: Self-pay

## 2019-11-29 NOTE — Progress Notes (Signed)
[facility]   [name] [name] Female, 69 y.o., Apr 19, 1951 MRN:  [id number] Phone:  [phone] (H) ... PCP:  Philmore Pali, NP Coverage:  [place] [employer] Medicare Hmo/Ppo Next Appt With Radiology ([facility]-US 2) 12/06/2019 at 1:00 PM  RE: Biopsy Received: 2 days ago Message Contents  [name], MD  [facility]      US guided R Cx LN Bx - outside PET 11/04/19 - image 27, series 3 versus central necrotic left submandibular LAN - image 32, series 3.   Cathren Harsh   Previous Messages  ----- Message -----  From: Lenore Cordia  Sent: 11/25/2019  3:31 PM EST  To: [facility] Procedure Requests  Subject: Biopsy                       Procedure Requested: [place] Core Biopsy (Lymph Nodes)   Reason for Procedure: Suspected recurrent laryngeal cancer    Provider Requesting: Tish Men  Provider Telephone: [phone]    Other Info: Outside films [employer] : Accession Number [id number] [facility]                                  Accession [id number]

## 2019-12-01 DIAGNOSIS — Z931 Gastrostomy status: Secondary | ICD-10-CM | POA: Diagnosis not present

## 2019-12-01 DIAGNOSIS — I2510 Atherosclerotic heart disease of native coronary artery without angina pectoris: Secondary | ICD-10-CM | POA: Diagnosis not present

## 2019-12-01 DIAGNOSIS — R69 Illness, unspecified: Secondary | ICD-10-CM | POA: Diagnosis not present

## 2019-12-01 DIAGNOSIS — E892 Postprocedural hypoparathyroidism: Secondary | ICD-10-CM | POA: Diagnosis not present

## 2019-12-01 DIAGNOSIS — Z930 Tracheostomy status: Secondary | ICD-10-CM | POA: Diagnosis not present

## 2019-12-01 DIAGNOSIS — Z483 Aftercare following surgery for neoplasm: Secondary | ICD-10-CM | POA: Diagnosis not present

## 2019-12-01 DIAGNOSIS — I959 Hypotension, unspecified: Secondary | ICD-10-CM | POA: Diagnosis not present

## 2019-12-01 DIAGNOSIS — C329 Malignant neoplasm of larynx, unspecified: Secondary | ICD-10-CM | POA: Diagnosis not present

## 2019-12-01 DIAGNOSIS — R1310 Dysphagia, unspecified: Secondary | ICD-10-CM | POA: Diagnosis not present

## 2019-12-03 ENCOUNTER — Other Ambulatory Visit: Payer: Self-pay | Admitting: Radiology

## 2019-12-06 ENCOUNTER — Encounter (HOSPITAL_COMMUNITY): Payer: Self-pay

## 2019-12-06 ENCOUNTER — Ambulatory Visit (HOSPITAL_COMMUNITY)
Admission: RE | Admit: 2019-12-06 | Discharge: 2019-12-06 | Disposition: A | Discharge status: Home / Self Care | Payer: Medicare HMO | Source: Ambulatory Visit | Attending: Hematology | Admitting: Hematology

## 2019-12-06 ENCOUNTER — Other Ambulatory Visit: Payer: Self-pay

## 2019-12-06 DIAGNOSIS — R590 Localized enlarged lymph nodes: Secondary | ICD-10-CM | POA: Diagnosis not present

## 2019-12-06 DIAGNOSIS — C321 Malignant neoplasm of supraglottis: Secondary | ICD-10-CM

## 2019-12-06 DIAGNOSIS — C770 Secondary and unspecified malignant neoplasm of lymph nodes of head, face and neck: Secondary | ICD-10-CM | POA: Diagnosis not present

## 2019-12-06 DIAGNOSIS — C069 Malignant neoplasm of mouth, unspecified: Secondary | ICD-10-CM | POA: Insufficient documentation

## 2019-12-06 DIAGNOSIS — Z79899 Other long term (current) drug therapy: Secondary | ICD-10-CM | POA: Insufficient documentation

## 2019-12-06 DIAGNOSIS — Z79890 Hormone replacement therapy (postmenopausal): Secondary | ICD-10-CM | POA: Insufficient documentation

## 2019-12-06 DIAGNOSIS — Z87891 Personal history of nicotine dependence: Secondary | ICD-10-CM | POA: Insufficient documentation

## 2019-12-06 DIAGNOSIS — C329 Malignant neoplasm of larynx, unspecified: Secondary | ICD-10-CM | POA: Diagnosis not present

## 2019-12-06 LAB — CBC WITH DIFFERENTIAL/PLATELET
Abs Immature Granulocytes: 0.01 10*3/uL (ref 0.00–0.07)
Basophils Absolute: 0 10*3/uL (ref 0.0–0.1)
Basophils Relative: 0 %
Eosinophils Absolute: 0.4 10*3/uL (ref 0.0–0.5)
Eosinophils Relative: 7 %
HCT: 36 % (ref 36.0–46.0)
Hemoglobin: 11.3 g/dL — ABNORMAL LOW (ref 12.0–15.0)
Immature Granulocytes: 0 %
Lymphocytes Relative: 18 %
Lymphs Abs: 1.1 10*3/uL (ref 0.7–4.0)
MCH: 28.3 pg (ref 26.0–34.0)
MCHC: 31.4 g/dL (ref 30.0–36.0)
MCV: 90 fL (ref 80.0–100.0)
Monocytes Absolute: 0.7 10*3/uL (ref 0.1–1.0)
Monocytes Relative: 12 %
Neutro Abs: 3.8 10*3/uL (ref 1.7–7.7)
Neutrophils Relative %: 63 %
Platelets: 196 10*3/uL (ref 150–400)
RBC: 4 MIL/uL (ref 3.87–5.11)
RDW: 12 % (ref 11.5–15.5)
WBC: 6.1 10*3/uL (ref 4.0–10.5)
nRBC: 0 % (ref 0.0–0.2)

## 2019-12-06 LAB — PROTIME-INR
INR: 1.1 (ref 0.8–1.2)
Prothrombin Time: 13.7 seconds (ref 11.4–15.2)

## 2019-12-06 IMAGING — US US BIOPSY LYMPH NODE
1 series · 13 of 17 positions shown · non-contrast
Comparison: none

INDICATION: 68-year-old female with a history of laryngeal cancer and recurrent
centrally necrotic but peripherally hypermetabolic cervical
adenopathy concerning for recurrent metastatic disease.

[Series 1: us biopsy lymph node · 13 of 17 slices shown]
[im 1/17]
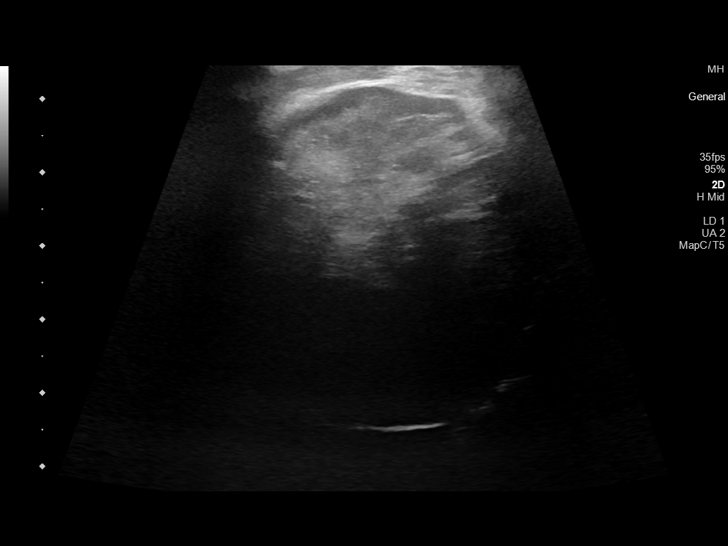
[im 2/17]
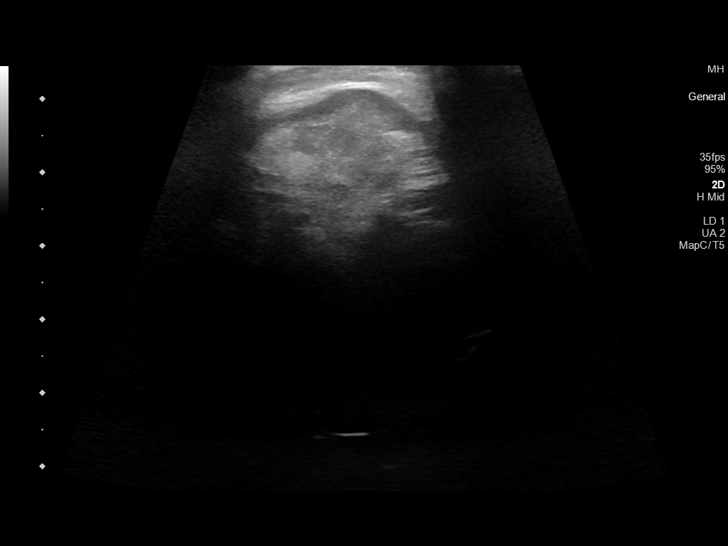
[im 4/17]
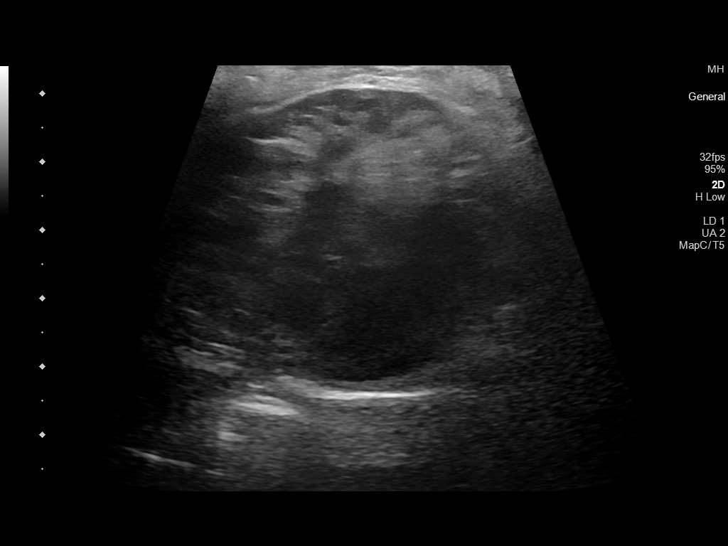
[im 5/17]
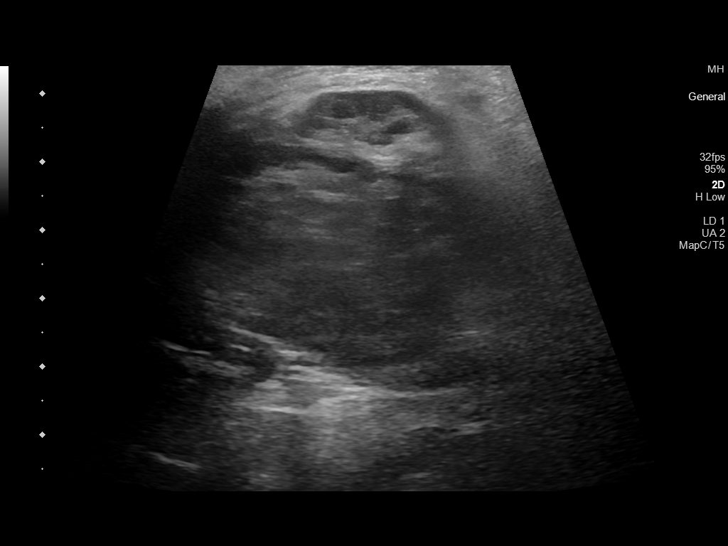
[im 6/17]
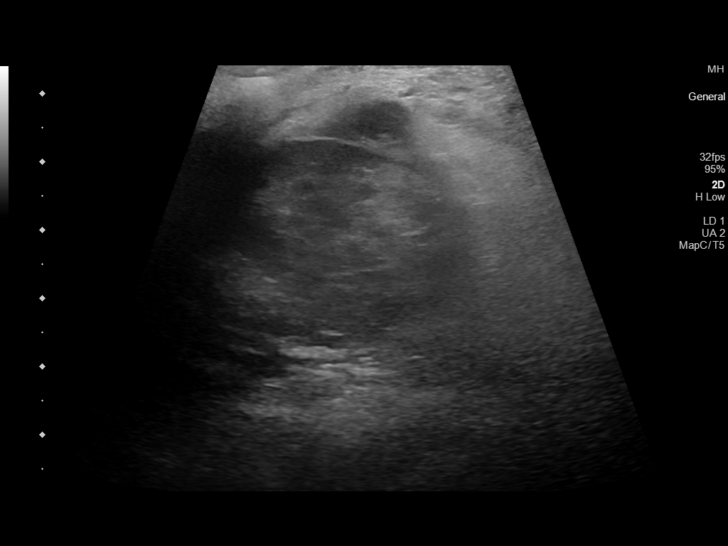
[im 8/17]
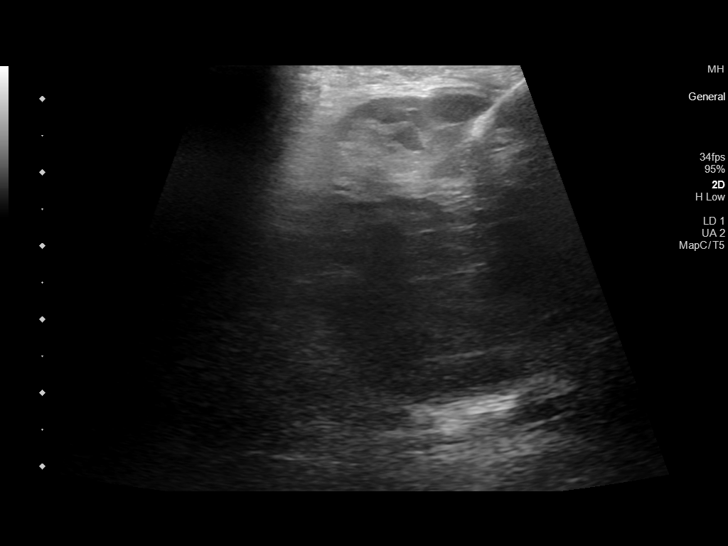
[im 9/17]
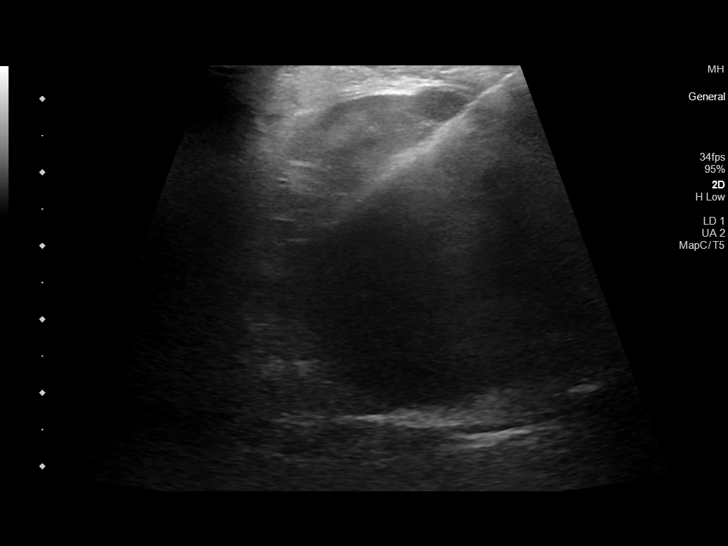
[im 10/17]
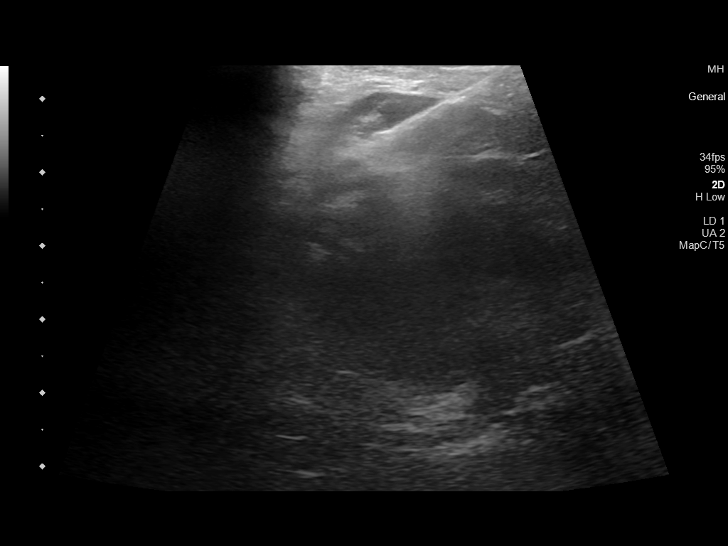
[im 12/17]
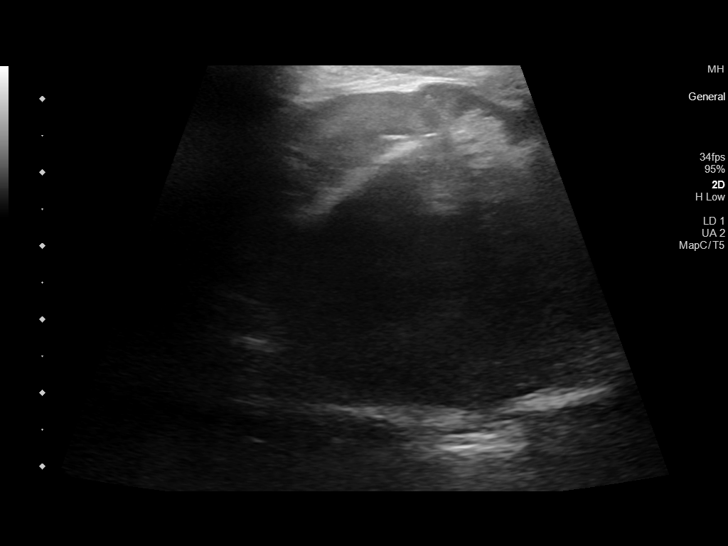
[im 13/17]
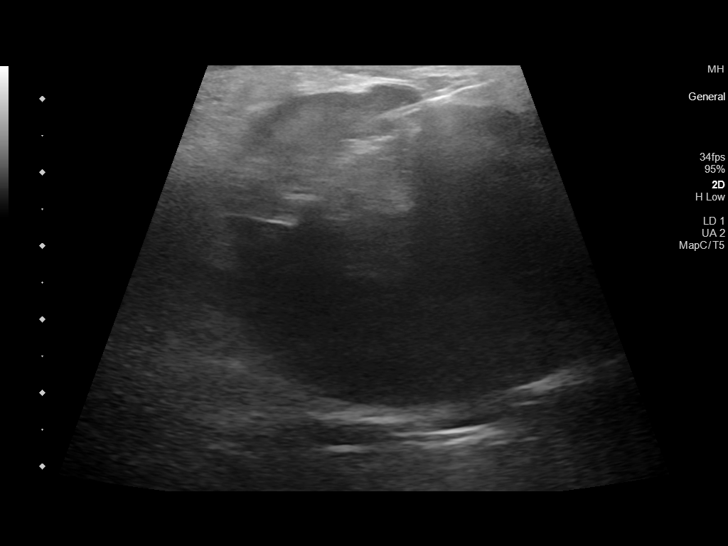
[im 14/17]
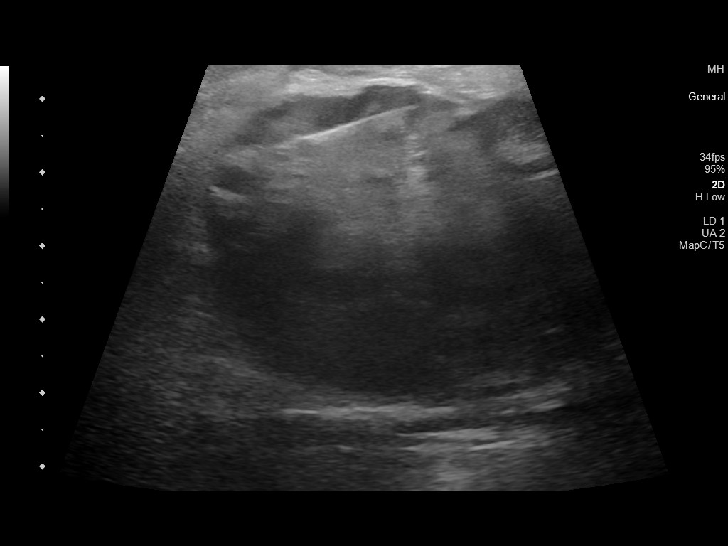
[im 16/17]
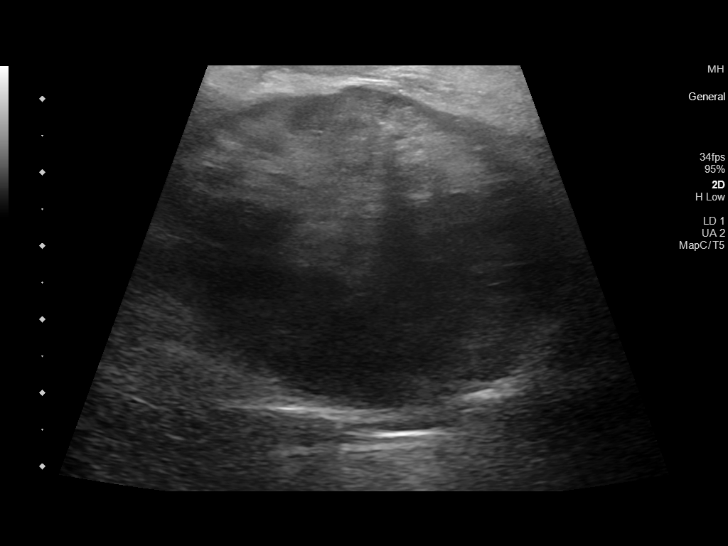
[im 17/17]
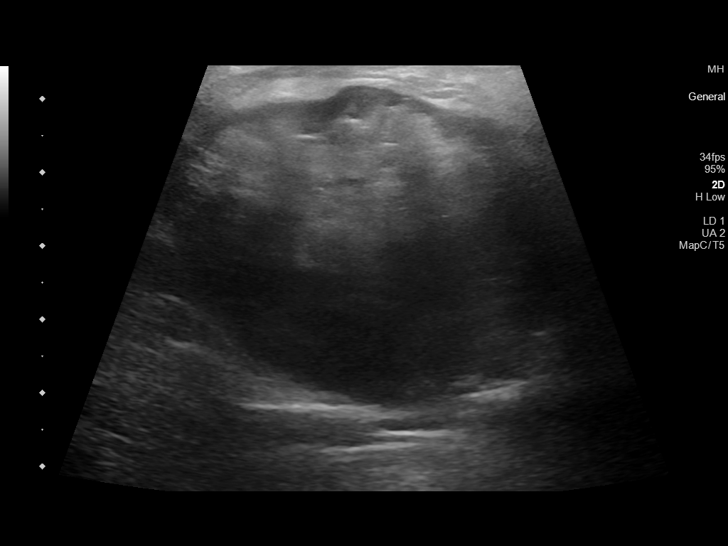

[13 of 17 positions shown; findings below may reference images not displayed]

EXAM:
ULTRASOUND CORE BIOPSY OF THE LYMPH NODES

MEDICATIONS:
None.

ANESTHESIA/SEDATION:
Moderate (conscious) sedation was employed during this procedure. A
total of Versed 3 mg and Fentanyl 75 mcg was administered
intravenously.

Moderate Sedation Time: 17 minutes. The patient's level of
consciousness and vital signs were monitored continuously by
radiology nursing throughout the procedure under my direct
supervision.

FLUOROSCOPY TIME:  None.

COMPLICATIONS:
None immediate.

PROCEDURE:
Informed written consent was obtained from the patient after a
thorough discussion of the procedural risks, benefits and
alternatives. All questions were addressed. Maximal Sterile Barrier
Technique was utilized including caps, mask, sterile gowns, sterile
gloves, sterile drape, hand hygiene and skin antiseptic. A timeout
was performed prior to the initiation of the procedure.

Local anesthesia was attained by infiltration with 1% lidocaine. A
small dermatotomy was made. With some difficulty, multiple 18 gauge
core biopsies were attained using the [employer] automated biopsy
device. The majority the biopsy specimens were fragmented. Two
specimens were fairly intact. Effort was made to target the
periphery of the lymph node given that the central aspect is likely
necrotic. Post biopsy ultrasound imaging demonstrates no evidence of
immediate complication.
IMPRESSION: Successful ultrasound-guided core biopsy of centrally necrotic left
submandibular lymph node. Of note, due to extensive prior
postsurgical and post radiation changes, patient has very limited
mobility in the neck and the biopsy was extremely challenging.

Optimal efforts were made to biopsy any active tissue in the
periphery of this lymph node. If biopsy specimen comes back as
nondiagnostic or necrotic debris, repeat biopsy is highly unlikely
to offer any further information. The smaller and less
hypermetabolic lymph nodes on the right could not be convincingly
identified sonographically due to extensive skin changes and limited
mobility.

## 2019-12-06 MED ORDER — FENTANYL CITRATE (PF) 100 MCG/2ML IJ SOLN
INTRAMUSCULAR | Status: AC
  Filled 2019-12-06: qty 2

## 2019-12-06 MED ORDER — SODIUM CHLORIDE 0.9 % IV SOLN: INTRAVENOUS | Status: DC

## 2019-12-06 MED ORDER — LIDOCAINE HCL 1 % IJ SOLN
INTRAMUSCULAR | Status: AC | PRN
  Administered 2019-12-06: 10 mL via INTRADERMAL

## 2019-12-06 MED ORDER — MIDAZOLAM HCL 2 MG/2ML IJ SOLN
INTRAMUSCULAR | Status: AC | PRN
  Administered 2019-12-06 (×3): 1 mg via INTRAVENOUS

## 2019-12-06 MED ORDER — LIDOCAINE HCL 1 % IJ SOLN
INTRAMUSCULAR | Status: AC
  Filled 2019-12-06: qty 20

## 2019-12-06 MED ORDER — MIDAZOLAM HCL 2 MG/2ML IJ SOLN
INTRAMUSCULAR | Status: AC
  Filled 2019-12-06: qty 2

## 2019-12-06 MED ORDER — FENTANYL CITRATE (PF) 100 MCG/2ML IJ SOLN
INTRAMUSCULAR | Status: AC | PRN
  Administered 2019-12-06: 25 ug via INTRAVENOUS
  Administered 2019-12-06: 50 ug via INTRAVENOUS

## 2019-12-06 MED ORDER — LIDOCAINE HCL (PF) 1 % IJ SOLN: INTRAMUSCULAR | Status: AC | PRN

## 2019-12-06 NOTE — Procedures (Signed)
Interventional Radiology Procedure Note  Procedure: US guided core biopsy of left submandibular LN.   Complications: None  Estimated Blood Loss: None  Recommendations: - DC home  Signed,  Criselda Peaches, MD

## 2019-12-06 NOTE — H&P (Signed)
Referring Physician(s): Zhao,Yan  Supervising Physician: Jacqulynn Cadet  Patient Status:  [facility]  Chief Complaint: "I'm having a biopsy''   Subjective: Patient familiar to IR service from Port-A-Cath placement on 02/02/2019 and gastrostomy tube placement on 02/09/2019.  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  She has also undergone chemoradiation as well as total laryngectomy/pharyngectomy, glossectomy and bilateral modified neck dissection with free lap.  Recent outside PET scan on 11/04/2019 revealed hypermetabolic bilateral neck lesions concerning for disease recurrence.  She presents today for image guided right cervical versus left submandibular lymph node biopsy for further evaluation.  She currently denies fever, headache, chest pain, dyspnea, cough, abdominal/back pain, nausea, vomiting or bleeding.  Past Medical History:  Diagnosis Date  . Cataract   . History of radiation therapy 02/11/19- 04/06/19   bilateral neck nodes and upper mediastinum  . Laryngeal cancer ([place]) 11/2018  . Thyroid disease    Past Surgical History:  Procedure Laterality Date  . CATARACT EXTRACTION, BILATERAL  left 05/20/16, right 06/03/16   Cataract Extraction w/ IOL Implant; Surgeon: Sudie Grumbling, MD; Location: [facility]; Service: Ophthalmology   . CESAREAN SECTION    . ESOPHAGOGASTRODUODENOSCOPY (EGD) WITH PROPOFOL N/A 02/01/2019   Procedure: ESOPHAGOGASTRODUODENOSCOPY (EGD) WITH PROPOFOL;  Surgeon: Milus Banister, MD;  Location: [facility];  Service: Endoscopy;  Laterality: N/A;  . EUS N/A 02/01/2019   Procedure: UPPER ENDOSCOPIC ULTRASOUND (EUS) RADIAL-Will need Linear and Forward Viewing EUS Scope;  Surgeon: Milus Banister, MD;  Location: [facility];  Service: Endoscopy;  Laterality: N/A;  . FINE NEEDLE ASPIRATION N/A 02/01/2019   Procedure: FINE NEEDLE ASPIRATION (FNA) LINEAR;  Surgeon: Milus Banister,  MD;  Location: [facility];  Service: Endoscopy;  Laterality: N/A;  . IR GASTROSTOMY TUBE MOD SED  02/09/2019  . IR IMAGING GUIDED PORT INSERTION  02/02/2019  . KNEE CARTILAGE SURGERY Right 2019  . TRACHEOSTOMY  12/02/2018   [facility]      Allergies: Patient has no known allergies.  Medications: Prior to Admission medications   Medication Sig Start Date End Date Taking? Authorizing Provider  chlorhexidine (PERIDEX) 0.12 % solution Rinse with 15 mls twice daily for 30 seconds. Use after breakfast and at bedtime. Spit out excess. Do not swallow. Patient taking differently: Use as directed 15 mLs in the mouth or throat 2 (two) times daily. Rinse with 15 mls twice daily for 30 seconds. Use after breakfast and at bedtime. Spit out excess. Do not swallow. 01/25/19  Yes Lenn Cal, DDS  HYDROcodone-acetaminophen (NORCO) 5-325 MG tablet Take 1 tablet by mouth every 8 (eight) hours as needed for moderate pain. 11/24/19  Yes Tish Men, MD  levothyroxine (SYNTHROID) 175 MCG tablet Take 200 mcg by mouth every morning.  01/08/19  Yes [provider]  Nutritional Supplements (KATE FARMS PEPTIDE 1.5) LIQD Take 4 Bottles by mouth daily. 03/24/19  Yes Tish Men, MD  sodium fluoride (PREVIDENT 5000 PLUS) 1.1 % CREA dental cream Apply cream to tooth brush. Brush teeth for 2 minutes. Spit out excess. DO NOT rinse afterwards. Repeat nightly. Patient taking differently: Place 1 application onto teeth 2 (two) times a day.  01/25/19  Yes Lenn Cal, DDS  docusate sodium (COLACE) 100 MG capsule Take 100 mg by mouth 2 (two) times daily.    [provider]  lidocaine (XYLOCAINE) 2 % solution Patient: Mix 1part 2% viscous lidocaine, 1part  H20. Swallow 47mL of diluted mixture, 77min before meals and at bedtime, up to QID prn soreness 03/16/19   Gery Pray, MD  LORazepam (ATIVAN) 0.5 MG tablet Take 1 tablet (0.5 mg total) by mouth every 6 (six) hours as needed (Nausea or vomiting). 03/19/19    Tish Men, MD  prochlorperazine (COMPAZINE) 10 MG tablet Take 1 tablet (10 mg total) by mouth every 6 (six) hours as needed (Nausea or vomiting). 02/08/19 04/21/19  Tish Men, MD     Vital Signs: BP 97/69   Pulse 91   Temp (!) 97.4 F (36.3 C) (Axillary)   Resp 18   SpO2 98%   Physical Exam awake, alert.  Right chest wall Port-A-Cath in place, trach in place, chest with clear breath sounds bilaterally.  Heart with regular rate and rhythm.  Abdomen soft, positive bowel sounds, intact gastrostomy tube.  No lower extremity edema.  Imaging: No results found.  Labs:  CBC: Recent Labs    05/12/19 1013 06/09/19 1035 07/07/19 0809 11/24/19 1509  WBC 9.0 9.2 10.1 5.7  HGB 7.6* 7.4* 9.0* 11.2*  HCT 23.4* 24.2* 29.0* 34.0*  PLT 311 368 376 179    COAGS: Recent Labs    02/02/19 1244 02/09/19 1023  INR 1.0 1.0    BMP: Recent Labs    05/12/19 1013 06/09/19 1035 07/07/19 0825 11/24/19 1509  NA 130* 132* 132* 139  K 4.2 4.4 4.2 4.2  CL 96* 96* 96* 103  CO2 23 27 25 27   GLUCOSE 113* 115* 121* 91  BUN 18 17 17  26*  CALCIUM 9.0 9.0 9.4 9.0  CREATININE 0.72 0.72 0.74 0.71  GFRNONAA >60 >60 >60 >60  GFRAA >60 >60 >60 >60    LIVER FUNCTION TESTS: Recent Labs    05/12/19 1013 06/09/19 1035 07/07/19 0825 11/24/19 1509  BILITOT 0.3 0.3 0.3 0.3  AST 27 42* 28 19  ALT 26 61* 39 14  ALKPHOS 109 130* 106 73  PROT 7.2 7.4 7.4 6.9  ALBUMIN 2.6* 2.5* 2.6* 3.7    Assessment and Plan: 69 yo ex-smoker with 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  She has also undergone chemoradiation as well as total laryngectomy/pharyngectomy, glossectomy and bilateral modified neck dissection with free lap.  Recent outside PET scan on 11/04/2019 revealed hypermetabolic bilateral neck lesions concerning for disease recurrence.  She presents today for image guided right cervical versus left submandibular lymph node biopsy for further  evaluation. Risks and benefits of procedure was discussed with the patient  including, but not limited to bleeding, infection, damage to adjacent structures or low yield requiring additional tests.  All of the questions were answered and there is agreement to proceed.  Consent signed and in chart.     Electronically Signed: D. Rowe Robert, PA-C 12/06/2019, 12:28 PM   I spent a total of 25 minutes  at the the patient's bedside AND on the patient's hospital floor or unit, greater than 50% of which was counseling/coordinating care for image guided biopsy of right cervical vs left submandibular lymph node

## 2019-12-06 NOTE — Discharge Instructions (Signed)
Biopsy Discharge Instructions  The procedure you just had is called a biopsy.  You may feel some discomfort after the local anesthetic wears off.  Your discomfort should improve over the next several days.  AFTER YOUR BIOPSY  Rest for the remainder of the day.  Avoid heavy lifting (more than 10 lb/4.5 kg).  If you have been given a general anesthetic or other medications to help you relax, you should not operate machinery, drive or make legal decisions for 24 hours after your procedure.  Additionally, someone must be available to drive you home.  Only take over-the-counter or prescription medicines for pain, discomfort, or fever as directed by your caregiver.  This can make bleeding worse.  You may resume your usual diet after the procedure.  Avoid alcoholic beverages for 24 hours after your procedure.  Keep the skin around your biopsy site clean and dry.  You may shower after 24 hours.  Cleanse and dry the biopsy site completely after you shower.  Avoid baths and swimming for 72 hours.  Complications are very uncommon after this procedure.  Go to the nearest Emergency Department or contact your caregiver if you develop any of the following symptoms:  Worsening pain  Bleeding  Swelling at the biopsy site  Light headedness or dizziness  Shortness of Breath  Fever or chills Redness or increased pain or swelling at the biopsy site   Moderate Conscious Sedation, Adult, Care After These instructions provide you with information about caring for yourself after your procedure. Your health care provider may also give you more specific instructions. Your treatment has been planned according to current medical practices, but problems sometimes occur. Call your health care provider if you have any problems or questions after your procedure. What can I expect after the procedure? After your procedure, it is common:  To feel sleepy for several hours.  To feel clumsy and have poor balance  for several hours.  To have poor judgment for several hours.  To vomit if you eat too soon. Follow these instructions at home: For at least 24 hours after the procedure:   Do not: ? Participate in activities where you could fall or become injured. ? Drive. ? Use heavy machinery. ? Drink alcohol. ? Take sleeping pills or medicines that cause drowsiness. ? Make important decisions or sign legal documents. ? Take care of children on your own.  Rest. Eating and drinking  Follow the diet recommended by your health care provider.  If you vomit: ? Drink water, juice, or soup when you can drink without vomiting. ? Make sure you have little or no nausea before eating solid foods. General instructions  Have a responsible adult stay with you until you are awake and alert.  Take over-the-counter and prescription medicines only as told by your health care provider.  If you smoke, do not smoke without supervision.  Keep all follow-up visits as told by your health care provider. This is important. Contact a health care provider if:  You keep feeling nauseous or you keep vomiting.  You feel light-headed.  You develop a rash.  You have a fever. Get help right away if:  You have trouble breathing. This information is not intended to replace advice given to you by your health care provider. Make sure you discuss any questions you have with your health care provider. Document Revised: 08/22/2017 Document Reviewed: 12/30/2015 Elsevier Patient Education  2020 [employer].

## 2019-12-07 DIAGNOSIS — Z430 Encounter for attention to tracheostomy: Secondary | ICD-10-CM | POA: Diagnosis not present

## 2019-12-07 DIAGNOSIS — J387 Other diseases of larynx: Secondary | ICD-10-CM | POA: Diagnosis not present

## 2019-12-07 DIAGNOSIS — J383 Other diseases of vocal cords: Secondary | ICD-10-CM | POA: Diagnosis not present

## 2019-12-07 LAB — SURGICAL PATHOLOGY

## 2019-12-08 ENCOUNTER — Other Ambulatory Visit: Payer: Self-pay

## 2019-12-08 ENCOUNTER — Telehealth: Payer: Self-pay | Admitting: Hematology

## 2019-12-08 ENCOUNTER — Other Ambulatory Visit: Payer: Self-pay | Admitting: Hematology

## 2019-12-08 ENCOUNTER — Telehealth: Payer: Self-pay | Admitting: *Deleted

## 2019-12-08 ENCOUNTER — Inpatient Hospital Stay: Payer: Medicare HMO | Admitting: Hematology

## 2019-12-08 ENCOUNTER — Encounter: Payer: Self-pay | Admitting: Hematology

## 2019-12-08 VITALS — BP 103/70 | HR 94 | Temp 98.7°F | Resp 18 | Ht 62.0 in | Wt 121.8 lb

## 2019-12-08 DIAGNOSIS — Z930 Tracheostomy status: Secondary | ICD-10-CM | POA: Diagnosis not present

## 2019-12-08 DIAGNOSIS — E892 Postprocedural hypoparathyroidism: Secondary | ICD-10-CM | POA: Diagnosis not present

## 2019-12-08 DIAGNOSIS — G893 Neoplasm related pain (acute) (chronic): Secondary | ICD-10-CM

## 2019-12-08 DIAGNOSIS — C321 Malignant neoplasm of supraglottis: Secondary | ICD-10-CM

## 2019-12-08 DIAGNOSIS — E46 Unspecified protein-calorie malnutrition: Secondary | ICD-10-CM | POA: Diagnosis not present

## 2019-12-08 DIAGNOSIS — Z7189 Other specified counseling: Secondary | ICD-10-CM

## 2019-12-08 DIAGNOSIS — R69 Illness, unspecified: Secondary | ICD-10-CM | POA: Diagnosis not present

## 2019-12-08 DIAGNOSIS — I2510 Atherosclerotic heart disease of native coronary artery without angina pectoris: Secondary | ICD-10-CM | POA: Diagnosis not present

## 2019-12-08 DIAGNOSIS — R1310 Dysphagia, unspecified: Secondary | ICD-10-CM | POA: Diagnosis not present

## 2019-12-08 DIAGNOSIS — C329 Malignant neoplasm of larynx, unspecified: Secondary | ICD-10-CM | POA: Diagnosis not present

## 2019-12-08 DIAGNOSIS — Z931 Gastrostomy status: Secondary | ICD-10-CM | POA: Diagnosis not present

## 2019-12-08 DIAGNOSIS — I959 Hypotension, unspecified: Secondary | ICD-10-CM | POA: Diagnosis not present

## 2019-12-08 DIAGNOSIS — Z483 Aftercare following surgery for neoplasm: Secondary | ICD-10-CM | POA: Diagnosis not present

## 2019-12-08 DIAGNOSIS — Z5112 Encounter for antineoplastic immunotherapy: Secondary | ICD-10-CM | POA: Diagnosis not present

## 2019-12-08 MED ORDER — ONDANSETRON HCL 8 MG PO TABS: 8.0000 mg | ORAL_TABLET | Freq: Two times a day (BID) | ORAL | 1 refills | Status: DC | PRN

## 2019-12-08 MED ORDER — DEXAMETHASONE 4 MG PO TABS: ORAL_TABLET | ORAL | 1 refills | Status: DC

## 2019-12-08 MED ORDER — HYDROCODONE-ACETAMINOPHEN 5-325 MG PO TABS: 1.0000 | ORAL_TABLET | Freq: Three times a day (TID) | ORAL | 0 refills | Status: DC | PRN

## 2019-12-08 MED ORDER — LIDOCAINE-PRILOCAINE 2.5-2.5 % EX CREA: TOPICAL_CREAM | CUTANEOUS | 3 refills | Status: DC

## 2019-12-08 MED ORDER — PROCHLORPERAZINE MALEATE 10 MG PO TABS: 10.0000 mg | ORAL_TABLET | Freq: Four times a day (QID) | ORAL | 1 refills | Status: DC | PRN

## 2019-12-08 MED ORDER — MORPHINE SULFATE ER 15 MG PO TBCR: 15.0000 mg | EXTENDED_RELEASE_TABLET | Freq: Two times a day (BID) | ORAL | 0 refills | Status: DC

## 2019-12-08 NOTE — Telephone Encounter (Signed)
Scheduled per los. Gave avs and calendar  

## 2019-12-08 NOTE — Progress Notes (Addendum)
[facility] OFFICE PROGRESS NOTE  Patient Care Team: Philmore Pali, NP as PCP - General (Nurse Practitioner) Eppie Gibson, MD as Attending Physician (Radiation Oncology) Tish Men, MD as Consulting Physician (Hematology) Leota Sauers, RN as Oncology Nurse Navigator Karie Mainland, RD as Dietitian (Nutrition) Sharen Counter, CCC-SLP as Speech Language Pathologist (Speech Pathology)  HEME/ONC OVERVIEW: 1.  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 Conley Canal and [place]  - 10/2019:   FDG-avid hypodense lesion near the left hemimandible (SUV 10) w/ b/l neck lesions (max SUV 4), subcentimeter pulmonary nodules below PET detection, suspicious for mets; focal FDG uptake in the R ventricular cavity (SUV 8)  An extrinsic mass compressing the RV and RA, measuring 4.3 cm on TTE; no hemodynamic compromise   Left submandibular LN bx, squamous cell carcinoma; CPS 20, molecular studies pending  -11/2019: palliative Keytruda  2. Port and PEG in 01/2019   TREATMENT REGIMEN:  12/03/2018: tracheostomy, DL and bx at [facility]   02/11/2019 - 04/06/2019: definitive chemoRT with weekly cisplatin x 7 doses   07/20/2019: total laryngectomy, pharyngectomy, glossectomy, b/l modified neck dissection, and free lap  12/16/2019 - present: 1st line Keytruda  ASSESSMENT & PLAN:   Stage IVA (cT3N2cM0) squamous cell carcinoma of the supraglottic  larynx with disease recurrence  -Patient underwent left submandibular LN biopsy on 12/06/2019, which showed squamous cell carcinoma -I reviewed the pathology results in detail with the patient, as well as the NCCN guideline  -Given the high PD-L1 score (CPS 20), the patient can start 1st palliative immunotherapy with Keytruda.  Molecular studies pending.  -I reviewed the NCCN guidelines with the patient  -The goal of the treatment is for palliative intent, NOT curative  -I discussed the rationale for and mechanism of immunotherapy -Some of the most common side effects include, but not limited to, fatigue, decreased appetite, dyspnea, cough, nausea, musculoskeletal pain, and constipation. Clinically significant immune-related adverse events for patients receiving immunotherapy included pneumonitis, hepatitis, colitis, and thyroid disease -The recommended dose is 277m q3weeks until disease progression or unacceptable toxicity  -The risks, benefits, side effects of treatment were discussed in detail with the patient, who agreed to proceed with plan of care -After extensive discussion, patient expressed understanding and agreed to proceed with the plan  -We will tentatively start her 1st treatment on 12/16/2019 -I have prescribed PRN anti-emetics, including Zofran and Compazine   Cancer-related pain -Secondary to nerve damage from recent surgery -Currently taking Norco 5/325, 2-3 times a day with some pain improvement -Given suboptimal pain control, I have added MS-Contin 15 mg BID, and refilled PRN Norco for breakthrough pain  Protein malnutrition -Secondary to chemoradiation and recent surgery  -Weight stable  -Currently taking 4 cans of [employer] the feeding tube, as well as 1 Boost and ~48 oz of water by mouth  -I encouraged patient to adhere to nutritional recommendations  Hypothyroidism -Currently on Synthroid 1767m -Continue follow-up with her endocrinologist   Goals of care  discussion -I discussed with the patient and her  son that given the recurrent disease, the goal of treatment is for palliative intent only, not curative   No orders of the defined types were placed in this encounter.  The total time spent in the encounter was 50 minutes, including face-to-face time with the patient, review of various tests results, order additional studies/medications, documentation, and coordination of care plan.   All questions were answered. The patient knows to call the clinic with any problems, questions or concerns. No barriers to learning was detected.  Return in 3 weeks for Cycle 2 of chemotherapy.   Tish Men, MD 3/17/20213:37 PM  CHIEF COMPLAINT: "I am doing okay"  INTERVAL HISTORY: Mr. Manzano returns to clinic for follow-up of recent cervical lymph node biopsy results.  Patient was accompanied by her partner [place].  Patient reports that she has been taking Norco 2-3 times a day for the left shoulder pain from her recent surgery with some pain relief, but it is still moderate in intensity.  She has been doing weekly home PT via home health, which will complete this week.  She is currently taking 4 cans of [facility] via feeding tube as well as 1 bottle of boost by mouth.  In addition, she drinks at least 4-5 bottles of 16 ounce water per day.  Due to trismus, she has not been able to eat any solid food.  She denies any other complaint today.  REVIEW OF SYSTEMS:   Constitutional: ( - ) fevers, ( - )  chills , ( - ) night sweats Eyes: ( - ) blurriness of vision, ( - ) double vision, ( - ) watery eyes Ears, nose, mouth, throat, and face: ( - ) mucositis, ( - ) sore throat Respiratory: ( - ) cough, ( - ) dyspnea, ( - ) wheezes Cardiovascular: ( - ) palpitation, ( - ) chest discomfort, ( - ) lower extremity swelling Gastrointestinal:  ( - ) nausea, ( - ) heartburn, ( - ) change in bowel habits Skin: ( - ) abnormal skin rashes Lymphatics: ( - ) new lymphadenopathy, ( -  ) easy bruising Neurological: ( - ) numbness, ( - ) tingling, ( - ) new weaknesses Behavioral/Psych: ( - ) mood change, ( - ) new changes  All other systems were reviewed with the patient and are negative.  SUMMARY OF ONCOLOGIC HISTORY: Oncology History  Malignant neoplasm of supraglottis ([place])  12/02/2018 Imaging   CT neck: IMPRESSION: 1. Supraglottic laryngeal carcinoma extending across the anterior commissure and invading the prelaryngeal fat, left aryepiglottic fold and left aspect of the epiglottis. 2. Severe narrowing of the laryngeal airway. 3. Bilateral subcentimeter level 2A and 2A cervical lymph nodes.  Critical Value/emergent results were called by telephone at the time of interpretation on 12/02/2018 at 5:34 pm to Dr. Collier Salina, who verbally acknowledged these results.   12/02/2018 Imaging   CT  Chest:  IMPRESSION: No evidence of metastatic disease in the chest.   12/03/2018 Pathology Results   (Care Everywhere; [facility]) A: Larynx, supraglottis, biopsy - Atypical squamous proliferation with at least low grade dysplasia and abundant keratinization - No definite high grade dysplasia or invasive carcinoma represented (see comment)   12/18/2018 Pathology Results   (Care Everywhere; [facility]) A:  Neck, left, fine needle aspiration - Rare single atypical cells, no cells diagnostic of malignancy - Lymph node material present  B:  Neck, right, fine needle aspiration - Non-diagnostic specimen - No lymph node material identified - Mixed inflammation and blood  12/31/2018 Pathology Results   (Care Everywhere; [facility])  A: Larynx, left supraglottic mass, biopsy - Atypical squamous proliferation with abundant keratinization, suspicious for well-differentiated invasive squamous cell carcinoma  B: Larynx, left supraglottic mass, biopsy - Well-differentiated invasive squamous cell carcinoma with abundant keratinization (see comment)   01/18/2019 Initial  Diagnosis   Laryngeal cancer ([place])   01/26/2019 Imaging   PET: IMPRESSION: 1. Locally advanced hypermetabolic laryngeal neoplasm centered in the left supraglottic space with bilateral glottic and epiglottic involvement and subglottic extension on the left. 2. Hypermetabolic bilateral level 3 neck nodal metastases. 3. Hypermetabolic high mediastinal nodal metastases between the trachea and upper thoracic esophagus. 4. Otherwise no distant hypermetabolic metastatic disease. 5. Nodular 7 mm focus at the left lung base, below PET resolution, recommend attention on follow-up chest CT in 3 months. 6. Chronic findings include: Aortic Atherosclerosis (ICD10-I70.0). Cholelithiasis.   02/08/2019 Cancer Staging   Staging form: Larynx - Supraglottis, AJCC 8th Edition - Clinical stage from 02/08/2019: Stage IVA (cT3, cN2c, cM0) - Signed by Eppie Gibson, MD on 02/08/2019   02/11/2019 - 04/07/2019 Chemotherapy   The patient had palonosetron (ALOXI) injection 0.25 mg, 0.25 mg, Intravenous,  Once, 7 of 7 cycles Administration: 0.25 mg (02/11/2019), 0.25 mg (02/18/2019), 0.25 mg (03/04/2019), 0.25 mg (03/11/2019), 0.25 mg (03/18/2019), 0.25 mg (03/25/2019), 0.25 mg (04/01/2019) CISplatin (PLATINOL) 72 mg in sodium chloride 0.9 % 250 mL chemo infusion, 40 mg/m2 = 72 mg, Intravenous,  Once, 7 of 7 cycles Administration: 72 mg (02/11/2019), 72 mg (02/18/2019), 72 mg (03/04/2019), 72 mg (03/11/2019), 72 mg (03/18/2019), 72 mg (03/25/2019), 72 mg (04/01/2019) fosaprepitant (EMEND) 150 mg, dexamethasone (DECADRON) 12 mg in sodium chloride 0.9 % 145 mL IVPB, , Intravenous,  Once, 7 of 7 cycles Administration:  (02/11/2019),  (02/18/2019),  (03/04/2019),  (03/11/2019),  (03/18/2019),  (03/25/2019),  (04/01/2019)  for chemotherapy treatment.    07/20/2019 Pathology Results   PROCEDURE: Total glossectomy, total pharyngectomy, total laryngectomy, total thyroidectomy, and cervical esophagectomy TUMOR SITE: Larynx TUMOR LATERALITY: Not  specified TUMOR FOCALITY: Unifocal TUMOR SIZE:    GREATEST DIMENSION: 6.9 cm    ADDITIONAL DIMENSIONS: 5.4 x 2.4 cm HISTOLOGIC TYPE: Squamous cell carcinoma, conventional (keratinizing) HISTOLOGIC GRADE:  G1-G2: Well to moderately differentiated TUMOR EXTENSION: Tumor involves the posterior tongue, pharynx, larynx, tracheal cartilage, hyoid bone, cervical esophagus, vagus nerve, carotid artery, deep cervical fascia, ans surrounding skeletal muscle and soft tissue MARGINS: Uninvolved by invasive tumor; Perineural invasion is less than 16m from the left soft tissue margin LYMPHOVASCULAR INVASION: Not identified PERINEURAL INVASION:  Present REGIONAL LYMPH NODES:     NUMBER OF LYMPH NODES INVOLVED: 2     NUMBER OF LYMPH NODES EXAMINED: 24     LATERALITY OF LYMPH NODES INVOLVED: Cannot determine     SIZE OF LARGEST METASTATIC DEPOSIT: 0.8 cm     EXTRANODAL EXTENSION: Not identified PATHOLOGIC STAGE CLASSIFICATION (pTNM, AJCC 8TH Ed): (r)(y) pT4b pN2a DISTANT METASTASIS (pM): ADDITIONAL PATHOLOGIC FINDINGS: Previous chemoradiation   11/04/2019 Imaging   PET: 1.  Hypermetabolic bilateral neck lesions as detailed above are concerning for disease recurrence. 2.  Compared to 07/08/2019 PET scan, interval development of subcentimeter bilateral upper lobe pulmonary nodules, size below PET resolution. These are concerning for metastasis. Consider diagnostic quality CT chest exam for further evaluation as per oncology treatment planning. 3.  Focal hypermetabolic uptake in right ventricular cavity of indeterminate significance. Recommend echocardiographic examination to rule out underlying thrombus. This finding was discussed with Dr. SConley Canalby Dr. GClementeen Grahamand Dr.  Thomas on 11/04/2019 at approximately 3:00 PM via telephone. 4.  Ancillary CT findings as above.   11/05/2019 Imaging   Echocardiogram: Summary   1. The left ventricle is normal in size with normal wall thickness.   2. The left  ventricular systolic function is normal, LVEF is visually estimated at 60-65%.   3. The right ventricle is normal in size, with normal systolic function.   4. There is an extrinsic compression of the RV and RA laterally at the AV groove by a mass of 4.3 cm diameter. Unclear if the mass is invasive, although no pericardial effusion seen suggestive no invasion possibly. There is no evidence of hemodynamic compromise secondary to cardiac compression with normal IVC diameter.   12/16/2019 -  Chemotherapy   The patient had palonosetron (ALOXI) injection 0.25 mg, 0.25 mg, Intravenous,  Once, 0 of 6 cycles pegfilgrastim-jmdb (FULPHILA) injection 6 mg, 6 mg, Subcutaneous,  Once, 0 of 6 cycles CARBOplatin (PARAPLATIN) 360 mg in sodium chloride 0.9 % 100 mL chemo infusion, 360 mg (100 % of original dose 357 mg), Intravenous,  Once, 0 of 6 cycles Dose modification: 357 mg (original dose 357 mg, Cycle 1) fosaprepitant (EMEND) 150 mg in sodium chloride 0.9 % 145 mL IVPB, 150 mg, Intravenous,  Once, 0 of 6 cycles pembrolizumab (KEYTRUDA) 200 mg in sodium chloride 0.9 % 50 mL chemo infusion, 200 mg (100 % of original dose 200 mg), Intravenous, Once, 0 of 6 cycles Dose modification: 200 mg (original dose 200 mg, Cycle 1, Reason: Provider Judgment) fluorouracil (ADRUCIL) 6,250 mg in sodium chloride 0.9 % 125 mL chemo infusion, 1,000 mg/m2/day = 6,250 mg, Intravenous, 4D (96 hours ), 0 of 6 cycles  for chemotherapy treatment.      I have reviewed the past medical history, past surgical history, social history and family history with the patient and they are unchanged from previous note.  ALLERGIES:  has No Known Allergies.  MEDICATIONS:  Current Outpatient Medications  Medication Sig Dispense Refill  . chlorhexidine (PERIDEX) 0.12 % solution Rinse with 15 mls twice daily for 30 seconds. Use after breakfast and at bedtime. Spit out excess. Do not swallow. (Patient taking differently: Use as directed 15 mLs  in the mouth or throat 2 (two) times daily. Rinse with 15 mls twice daily for 30 seconds. Use after breakfast and at bedtime. Spit out excess. Do not swallow.) 480 mL prn  . dexamethasone (DECADRON) 4 MG tablet Take 2 tablets by mouth once a day on the day after chemotherapy and then take 2 tablets two times a day for 2 days. Take with food. 30 tablet 1  . docusate sodium (COLACE) 100 MG capsule Take 100 mg by mouth 2 (two) times daily.    Marland Kitchen HYDROcodone-acetaminophen (NORCO) 5-325 MG tablet Take 1 tablet by mouth every 8 (eight) hours as needed for moderate pain. 60 tablet 0  . levothyroxine (SYNTHROID) 175 MCG tablet Take 200 mcg by mouth every morning.     . lidocaine (XYLOCAINE) 2 % solution Patient: Mix 1part 2% viscous lidocaine, 1part H20. Swallow 10m of diluted mixture, 354m before meals and at bedtime, up to QID prn soreness 100 mL 5  . lidocaine-prilocaine (EMLA) cream Apply to affected area once 30 g 3  . morphine (MS CONTIN) 15 MG 12 hr tablet Take 1 tablet (15 mg total) by mouth every 12 (twelve) hours. 60 tablet 0  . Nutritional Supplements (KATE FARMS PEPTIDE 1.5) LIQD Take 4 Bottles by mouth daily. 325 mL  6  . ondansetron (ZOFRAN) 8 MG tablet Take 1 tablet (8 mg total) by mouth 2 (two) times daily as needed. Start on the third day after chemotherapy. 30 tablet 1  . prochlorperazine (COMPAZINE) 10 MG tablet Take 1 tablet (10 mg total) by mouth every 6 (six) hours as needed (Nausea or vomiting). 30 tablet 1  . sodium fluoride (PREVIDENT 5000 PLUS) 1.1 % CREA dental cream Apply cream to tooth brush. Brush teeth for 2 minutes. Spit out excess. DO NOT rinse afterwards. Repeat nightly. (Patient taking differently: Place 1 application onto teeth 2 (two) times a day. ) 1 Tube prn   No current facility-administered medications for this visit.    PHYSICAL EXAMINATION: ECOG PERFORMANCE STATUS: 1 - Symptomatic but completely ambulatory  Today's Vitals   12/08/19 1426 12/08/19 1428 12/08/19  1438  BP: (!) 88/54 103/70   Pulse: 94    Resp: 18    Temp: 98.7 F (37.1 C)    TempSrc: Oral    SpO2: 98%    Weight: 121 lb 12.8 oz (55.2 kg)    Height: _0  (1.575 m)    PainSc:   6    Body mass index is 22.28 kg/m.  Filed Weights   12/08/19 1426  Weight: 121 lb 12.8 oz (55.2 kg)    GENERAL: alert, no distress and comfortable SKIN: skin color, texture, turgor are normal, no rashes or significant lesions EYES: conjunctiva are pink and non-injected, sclera clear OROPHARYNX: moderate to severe trismus (~1cm jaw opening), limited visualization of oral cavity  NECK: thin, non-tender, tracheostomy in place  LYMPH:  no definite lymphadenopathy in the cervical LUNGS: clear to auscultation with normal breathing effort HEART: regular rate & rhythm and no murmurs and no lower extremity edema ABDOMEN: soft, non-tender, non-distended, normal bowel sounds Musculoskeletal: no cyanosis of digits and no clubbing  PSYCH: alert, non-verbal due to tracheostomy  LABORATORY DATA:  I have reviewed the data as listed    Component Value Date/Time   NA 139 11/24/2019 1509   K 4.2 11/24/2019 1509   CL 103 11/24/2019 1509   CO2 27 11/24/2019 1509   GLUCOSE 91 11/24/2019 1509   BUN 26 (H) 11/24/2019 1509   CREATININE 0.71 11/24/2019 1509   CALCIUM 9.0 11/24/2019 1509   PROT 6.9 11/24/2019 1509   ALBUMIN 3.7 11/24/2019 1509   AST 19 11/24/2019 1509   ALT 14 11/24/2019 1509   ALKPHOS 73 11/24/2019 1509   BILITOT 0.3 11/24/2019 1509   GFRNONAA >60 11/24/2019 1509   GFRAA >60 11/24/2019 1509    No results found for: SPEP, UPEP  Lab Results  Component Value Date   WBC 6.1 12/06/2019   NEUTROABS 3.8 12/06/2019   HGB 11.3 (L) 12/06/2019   HCT 36.0 12/06/2019   MCV 90.0 12/06/2019   PLT 196 12/06/2019      Chemistry      Component Value Date/Time   NA 139 11/24/2019 1509   K 4.2 11/24/2019 1509   CL 103 11/24/2019 1509   CO2 27 11/24/2019 1509   BUN 26 (H) 11/24/2019 1509    CREATININE 0.71 11/24/2019 1509      Component Value Date/Time   CALCIUM 9.0 11/24/2019 1509   ALKPHOS 73 11/24/2019 1509   AST 19 11/24/2019 1509   ALT 14 11/24/2019 1509   BILITOT 0.3 11/24/2019 1509       RADIOGRAPHIC STUDIES: I have personally reviewed the radiological images as listed below and agreed with the findings in the  report. [place] CORE BIOPSY (LYMPH NODES)  Result Date: 12/06/2019 INDICATION: 69 year old female with a history of laryngeal cancer and recurrent centrally necrotic but peripherally hypermetabolic cervical adenopathy concerning for recurrent metastatic disease. EXAM: ULTRASOUND CORE BIOPSY OF THE LYMPH NODES MEDICATIONS: None. ANESTHESIA/SEDATION: Moderate (conscious) sedation was employed during this procedure. A total of Versed 3 mg and Fentanyl 75 mcg was administered intravenously. Moderate Sedation Time: 17 minutes. The patient's level of consciousness and vital signs were monitored continuously by radiology nursing throughout the procedure under my direct supervision. FLUOROSCOPY TIME:  None. COMPLICATIONS: None immediate. PROCEDURE: Informed written consent was obtained from the patient after a thorough discussion of the procedural risks, benefits and alternatives. All questions were addressed. Maximal Sterile Barrier Technique was utilized including caps, mask, sterile gowns, sterile gloves, sterile drape, hand hygiene and skin antiseptic. A timeout was performed prior to the initiation of the procedure. Local anesthesia was attained by infiltration with 1% lidocaine. A small dermatotomy was made. With some difficulty, multiple 18 gauge core biopsies were attained using the Bard mission automated biopsy device. The majority the biopsy specimens were fragmented. Two specimens were fairly intact. Effort was made to target the periphery of the lymph node given that the central aspect is likely necrotic. Post biopsy ultrasound imaging demonstrates no evidence of immediate  complication. IMPRESSION: Successful ultrasound-guided core biopsy of centrally necrotic left submandibular lymph node. Of note, due to extensive prior postsurgical and post radiation changes, patient has very limited mobility in the neck and the biopsy was extremely challenging. Optimal efforts were made to biopsy any active tissue in the periphery of this lymph node. If biopsy specimen comes back as nondiagnostic or necrotic debris, repeat biopsy is highly unlikely to offer any further information. The smaller and less hypermetabolic lymph nodes on the right could not be convincingly identified sonographically due to extensive skin changes and limited mobility. Signed, Criselda Peaches, MD, [facility] Radiology Electronically Signed   By: Jacqulynn Cadet M.D.   On: 12/06/2019 14:39

## 2019-12-08 NOTE — Progress Notes (Signed)
DISCONTINUE ON PATHWAY REGIMEN - Head and Neck     A cycle is every 7 days:     Cisplatin   **Always confirm dose/schedule in your pharmacy ordering system**  REASON: Disease Progression PRIOR TREATMENT: [id number]: Cisplatin 40 mg/m2 q7 Days with Concurrent Radiation TREATMENT RESPONSE: Progressive Disease (PD)  START ON PATHWAY REGIMEN - Head and Neck     A cycle is every 21 days:     Carboplatin      Fluorouracil      Pembrolizumab   **Always confirm dose/schedule in your pharmacy ordering system**  Patient Characteristics: Larynx, Metastatic, First Line, No Prior Platinum-Based Chemoradiation within 6 Months, PD-L1 Expression Negative (CPS < 1) / Unknown Disease Classification: Larynx Current Disease Status: Metastatic Disease AJCC T Category: T2 AJCC 8 Stage Grouping: IVA AJCC N Category: cN2c AJCC M Category: M0 Line of Therapy: First Line Prior Platinum Status: No Prior Platinum-Based Chemoradiation within 6 Months PD-L1 Expression Status: Awaiting Test Results Intent of Therapy: Non-Curative / Palliative Intent, Discussed with Patient

## 2019-12-08 NOTE — Telephone Encounter (Signed)
Oncology Nurse Navigator Documentation  Requisition form and support documentation faxed to [employer]  Notification of successful fax transmission received.  Gayleen Orem, RN, BSN Head & Neck Oncology Nurse [place] [facility] [place] [phone]

## 2019-12-09 DIAGNOSIS — Z931 Gastrostomy status: Secondary | ICD-10-CM | POA: Diagnosis not present

## 2019-12-09 DIAGNOSIS — Z930 Tracheostomy status: Secondary | ICD-10-CM | POA: Diagnosis not present

## 2019-12-09 DIAGNOSIS — R69 Illness, unspecified: Secondary | ICD-10-CM | POA: Diagnosis not present

## 2019-12-09 DIAGNOSIS — C329 Malignant neoplasm of larynx, unspecified: Secondary | ICD-10-CM | POA: Diagnosis not present

## 2019-12-09 DIAGNOSIS — E892 Postprocedural hypoparathyroidism: Secondary | ICD-10-CM | POA: Diagnosis not present

## 2019-12-09 DIAGNOSIS — I2510 Atherosclerotic heart disease of native coronary artery without angina pectoris: Secondary | ICD-10-CM | POA: Diagnosis not present

## 2019-12-09 DIAGNOSIS — Z483 Aftercare following surgery for neoplasm: Secondary | ICD-10-CM | POA: Diagnosis not present

## 2019-12-09 DIAGNOSIS — R1310 Dysphagia, unspecified: Secondary | ICD-10-CM | POA: Diagnosis not present

## 2019-12-09 DIAGNOSIS — I959 Hypotension, unspecified: Secondary | ICD-10-CM | POA: Diagnosis not present

## 2019-12-10 DIAGNOSIS — C321 Malignant neoplasm of supraglottis: Secondary | ICD-10-CM | POA: Diagnosis not present

## 2019-12-10 NOTE — Progress Notes (Signed)
Pharmacist Chemotherapy Monitoring - Initial Assessment    Anticipated start date: 12/16/19  Regimen:  . Are orders appropriate based on the patient's diagnosis, regimen, and cycle? Yes . Does the plan date match the patient's scheduled date? Yes . Is the sequencing of drugs appropriate? Yes . Are the premedications appropriate for the patient's regimen? Yes . Prior Authorization for treatment is: Pending o If applicable, is the correct biosimilar selected based on the patient's insurance? yes  Organ Function and Labs: Marland Kitchen Are dose adjustments needed based on the patient's renal function, hepatic function, or hematologic function? Yes . Are appropriate labs ordered prior to the start of patient's treatment? Yes . Other organ system assessment, if indicated: N/A . The following baseline labs, if indicated, have been ordered: N/A  Dose Assessment: . Are the drug doses appropriate? Yes . Are the following correct: o Drug concentrations Yes o IV fluid compatible with drug Yes o Administration routes Yes o Timing of therapy Yes . If applicable, does the patient have documented access for treatment and/or plans for port-a-cath placement? yes . If applicable, have lifetime cumulative doses been properly documented and assessed? yes Lifetime Dose Tracking  No doses have been documented on this patient for the following tracked chemicals: Doxorubicin, Epirubicin, Idarubicin, Daunorubicin, Mitoxantrone, Bleomycin, Oxaliplatin, Carboplatin, Liposomal Doxorubicin  o   Toxicity Monitoring/Prevention: . The patient has the following take home antiemetics prescribed: Dexamethasone compazine zofran . The patient has the following take home medications prescribed: N/A . Medication allergies and previous infusion related reactions, if applicable, have been reviewed and addressed. Yes . The patient's current medication list has been assessed for drug-drug interactions with their chemotherapy regimen. no  significant drug-drug interactions were identified on review.  Order Review: . Are the treatment plan orders signed? Yes . Is the patient scheduled to see a provider prior to their treatment? No  I verify that I have reviewed each item in the above checklist and answered each question accordingly.  [name] [name] Course 12/10/2019 11:14 AM

## 2019-12-13 ENCOUNTER — Inpatient Hospital Stay: Payer: Medicare HMO

## 2019-12-14 ENCOUNTER — Other Ambulatory Visit: Payer: Self-pay

## 2019-12-14 ENCOUNTER — Inpatient Hospital Stay: Payer: Medicare HMO

## 2019-12-14 MED ORDER — LIDOCAINE-PRILOCAINE 2.5-2.5 % EX CREA: TOPICAL_CREAM | CUTANEOUS | 3 refills | Status: DC

## 2019-12-14 MED ORDER — PROCHLORPERAZINE MALEATE 10 MG PO TABS: 10.0000 mg | ORAL_TABLET | Freq: Four times a day (QID) | ORAL | 1 refills | Status: DC | PRN

## 2019-12-14 MED ORDER — ONDANSETRON HCL 8 MG PO TABS: 8.0000 mg | ORAL_TABLET | Freq: Two times a day (BID) | ORAL | 1 refills | Status: DC | PRN

## 2019-12-14 NOTE — Progress Notes (Signed)
DISCONTINUE ON PATHWAY REGIMEN - Head and Neck     A cycle is every 21 days:     Carboplatin      Fluorouracil      Pembrolizumab   **Always confirm dose/schedule in your pharmacy ordering system**  REASON: Other Reason PRIOR TREATMENT: [id number]: Pembrolizumab 200 mg D1 + Carboplatin AUC=5 D1 + Fluorouracil 1,000 mg/m2/day CIV D1-4 q21 Days x 6 Cycles, then Pembrolizumab 200 mg q21 Days for up to a total of 24 Months TREATMENT RESPONSE: Unable to Evaluate  START ON PATHWAY REGIMEN - Head and Neck     A cycle is 21 days:     Pembrolizumab   **Always confirm dose/schedule in your pharmacy ordering system**  Patient Characteristics: Larynx, Metastatic, First Line, No Prior Platinum-Based Chemoradiation within 6 Months, PD-L1 Expression Positive (CPS ? 1) Disease Classification: Larynx Current Disease Status: Metastatic Disease AJCC T Category: T2 AJCC 8 Stage Grouping: IVA AJCC N Category: cN2c AJCC M Category: M0 Line of Therapy: First Line Prior Platinum Status: No Prior Platinum-Based Chemoradiation within 6 Months PD-L1 Expression Status: PD-L1 Positive (CPS ? 1) Intent of Therapy: Non-Curative / Palliative Intent, Discussed with Patient 

## 2019-12-14 NOTE — Addendum Note (Signed)
Addended by: Tish Men on: 12/14/2019 09:02 AM   Modules accepted: Orders

## 2019-12-15 DIAGNOSIS — R490 Dysphonia: Secondary | ICD-10-CM | POA: Insufficient documentation

## 2019-12-16 ENCOUNTER — Other Ambulatory Visit: Payer: Self-pay

## 2019-12-16 ENCOUNTER — Inpatient Hospital Stay: Payer: Medicare HMO

## 2019-12-16 ENCOUNTER — Telehealth: Payer: Self-pay | Admitting: *Deleted

## 2019-12-16 VITALS — BP 87/65 | HR 79 | Temp 98.3°F | Resp 18

## 2019-12-16 DIAGNOSIS — C329 Malignant neoplasm of larynx, unspecified: Secondary | ICD-10-CM

## 2019-12-16 DIAGNOSIS — Z95828 Presence of other vascular implants and grafts: Secondary | ICD-10-CM

## 2019-12-16 DIAGNOSIS — Z5112 Encounter for antineoplastic immunotherapy: Secondary | ICD-10-CM | POA: Diagnosis not present

## 2019-12-16 DIAGNOSIS — C321 Malignant neoplasm of supraglottis: Secondary | ICD-10-CM

## 2019-12-16 LAB — CBC WITH DIFFERENTIAL (CANCER CENTER ONLY)
Abs Immature Granulocytes: 0.03 10*3/uL (ref 0.00–0.07)
Basophils Absolute: 0 10*3/uL (ref 0.0–0.1)
Basophils Relative: 0 %
Eosinophils Absolute: 0.5 10*3/uL (ref 0.0–0.5)
Eosinophils Relative: 7 %
HCT: 31.7 % — ABNORMAL LOW (ref 36.0–46.0)
Hemoglobin: 10.2 g/dL — ABNORMAL LOW (ref 12.0–15.0)
Immature Granulocytes: 1 %
Lymphocytes Relative: 14 %
Lymphs Abs: 0.9 10*3/uL (ref 0.7–4.0)
MCH: 27.3 pg (ref 26.0–34.0)
MCHC: 32.2 g/dL (ref 30.0–36.0)
MCV: 84.8 fL (ref 80.0–100.0)
Monocytes Absolute: 0.7 10*3/uL (ref 0.1–1.0)
Monocytes Relative: 11 %
Neutro Abs: 4.4 10*3/uL (ref 1.7–7.7)
Neutrophils Relative %: 67 %
Platelet Count: 190 10*3/uL (ref 150–400)
RBC: 3.74 MIL/uL — ABNORMAL LOW (ref 3.87–5.11)
RDW: 12.3 % (ref 11.5–15.5)
WBC Count: 6.6 10*3/uL (ref 4.0–10.5)
nRBC: 0 % (ref 0.0–0.2)

## 2019-12-16 LAB — CMP (CANCER CENTER ONLY)
ALT: 13 U/L (ref 0–44)
AST: 16 U/L (ref 15–41)
Albumin: 3.3 g/dL — ABNORMAL LOW (ref 3.5–5.0)
Alkaline Phosphatase: 81 U/L (ref 38–126)
Anion gap: 10 (ref 5–15)
BUN: 20 mg/dL (ref 8–23)
CO2: 28 mmol/L (ref 22–32)
Calcium: 9.1 mg/dL (ref 8.9–10.3)
Chloride: 100 mmol/L (ref 98–111)
Creatinine: 0.67 mg/dL (ref 0.44–1.00)
GFR, Est AFR Am: >60 mL/min (ref >60)
GFR, Estimated: >60 mL/min (ref >60)
Glucose, Bld: 101 mg/dL — ABNORMAL HIGH (ref 70–99)
Potassium: 4.4 mmol/L (ref 3.5–5.1)
Sodium: 138 mmol/L (ref 135–145)
Total Bilirubin: 0.3 mg/dL (ref 0.3–1.2)
Total Protein: 6.7 g/dL (ref 6.5–8.1)

## 2019-12-16 LAB — TSH: TSH: <0.08 u[IU]/mL — ABNORMAL LOW (ref 0.308–3.960)

## 2019-12-16 LAB — MAGNESIUM: Magnesium: 2.1 mg/dL (ref 1.7–2.4)

## 2019-12-16 MED ORDER — SODIUM CHLORIDE 0.9 % IV SOLN
Freq: Once | INTRAVENOUS | Status: AC
  Filled 2019-12-16: qty 250

## 2019-12-16 MED ORDER — HEPARIN SOD (PORK) LOCK FLUSH 100 UNIT/ML IV SOLN
500.0000 [IU] | Freq: Once | INTRAVENOUS | Status: AC | PRN
  Administered 2019-12-16: 500 [IU]
  Filled 2019-12-16: qty 5

## 2019-12-16 MED ORDER — SODIUM CHLORIDE 0.9 % IV SOLN
200.0000 mg | Freq: Once | INTRAVENOUS | Status: AC
  Administered 2019-12-16: 200 mg via INTRAVENOUS
  Filled 2019-12-16: qty 8

## 2019-12-16 MED ORDER — SODIUM CHLORIDE 0.9% FLUSH
10.0000 mL | INTRAVENOUS | Status: DC | PRN
  Administered 2019-12-16: 10 mL
  Filled 2019-12-16: qty 10

## 2019-12-16 NOTE — Patient Instructions (Signed)
Pembrolizumab injection What is this medicine? PEMBROLIZUMAB (pem broe liz ue mab) is a monoclonal antibody. It is used to treat certain types of cancer. This medicine may be used for other purposes; ask your health care provider or pharmacist if you have questions. COMMON BRAND NAME(S): Keytruda What should I tell my health care provider before I take this medicine? They need to know if you have any of these conditions:  diabetes  immune system problems  inflammatory bowel disease  liver disease  lung or breathing disease  lupus  received or scheduled to receive an organ transplant or a stem-cell transplant that uses donor stem cells  an unusual or allergic reaction to pembrolizumab, other medicines, foods, dyes, or preservatives  pregnant or trying to get pregnant  breast-feeding How should I use this medicine? This medicine is for infusion into a vein. It is given by a health care professional in a hospital or clinic setting. A special MedGuide will be given to you before each treatment. Be sure to read this information carefully each time. Talk to your pediatrician regarding the use of this medicine in children. While this drug may be prescribed for children as young as 6 months for selected conditions, precautions do apply. Overdosage: If you think you have taken too much of this medicine contact a poison control center or emergency room at once. NOTE: This medicine is only for you. Do not share this medicine with others. What if I miss a dose? It is important not to miss your dose. Call your doctor or health care professional if you are unable to keep an appointment. What may interact with this medicine? Interactions have not been studied. Give your health care provider a list of all the medicines, herbs, non-prescription drugs, or dietary supplements you use. Also tell them if you smoke, drink alcohol, or use illegal drugs. Some items may interact with your medicine. This  list may not describe all possible interactions. Give your health care provider a list of all the medicines, herbs, non-prescription drugs, or dietary supplements you use. Also tell them if you smoke, drink alcohol, or use illegal drugs. Some items may interact with your medicine. What should I watch for while using this medicine? Your condition will be monitored carefully while you are receiving this medicine. You may need blood work done while you are taking this medicine. Do not become pregnant while taking this medicine or for 4 months after stopping it. Women should inform their doctor if they wish to become pregnant or think they might be pregnant. There is a potential for serious side effects to an unborn child. Talk to your health care professional or pharmacist for more information. Do not breast-feed an infant while taking this medicine or for 4 months after the last dose. What side effects may I notice from receiving this medicine? Side effects that you should report to your doctor or health care professional as soon as possible:  allergic reactions like skin rash, itching or hives, swelling of the face, lips, or tongue  bloody or black, tarry  breathing problems  changes in vision  chest pain  chills  confusion  constipation  cough  diarrhea  dizziness or feeling faint or lightheaded  fast or irregular heartbeat  fever  flushing  joint pain  low blood counts - this medicine may decrease the number of white blood cells, red blood cells and platelets. You may be at increased risk for infections and bleeding.  muscle pain  muscle   weakness  pain, tingling, numbness in the hands or feet  persistent headache  redness, blistering, peeling or loosening of the skin, including inside the mouth  signs and symptoms of high blood sugar such as dizziness; dry mouth; dry skin; fruity breath; nausea; stomach pain; increased hunger or thirst; increased urination  signs  and symptoms of kidney injury like trouble passing urine or change in the amount of urine  signs and symptoms of liver injury like dark urine, light-colored stools, loss of appetite, nausea, right upper belly pain, yellowing of the eyes or skin  sweating  swollen lymph nodes  weight loss Side effects that usually do not require medical attention (report to your doctor or health care professional if they continue or are bothersome):  decreased appetite  hair loss  muscle pain  tiredness This list may not describe all possible side effects. Call your doctor for medical advice about side effects. You may report side effects to FDA at 1-800-FDA-1088. Where should I keep my medicine? This drug is given in a hospital or clinic and will not be stored at home. NOTE: This sheet is a summary. It may not cover all possible information. If you have questions about this medicine, talk to your doctor, pharmacist, or health care provider.  2020 Elsevier/Gold Standard (2019-07-16 18:07:58)  

## 2019-12-16 NOTE — Progress Notes (Signed)
Pt. BP 87/65 is baseline. Okay to treat.

## 2019-12-17 ENCOUNTER — Telehealth: Payer: Self-pay | Admitting: *Deleted

## 2019-12-17 DIAGNOSIS — C321 Malignant neoplasm of supraglottis: Secondary | ICD-10-CM | POA: Diagnosis not present

## 2019-12-17 LAB — T4: T4, Total: 9.6 ug/dL (ref 4.5–12.0)

## 2019-12-17 NOTE — Telephone Encounter (Signed)
Oncology Nurse Navigator Documentation  Following yesterday's Infusion visit with Ms. Heide Spark, called SO Cindy, reiterated my recommendation to schedule appt with Dr. Conley Canal, [facility], to have trach stoma evaluated.  I indicated I shared his guidance with Makaria as well which she acknowledged.  I called her again shortly after this conversation to explain the change of systemic treatment to [facility] only was based on PDL-1 positivity, confirmed Yula does not need to be taking dexamethasone s/p Keytruda infusions.   Gayleen Orem, RN, BSN Head & Neck Oncology Nurse [place] at [place] [phone]

## 2019-12-17 NOTE — Telephone Encounter (Signed)
-----   Message from Tish Men, MD sent at 12/16/2019  5:40 PM EDT ----- Can you please let Ms. Zandi know that her TSH is very low, indicating that she may be taking too much Synthroid? She should contact her endocrinologist ASAP to discuss her Synthroid dose.   Thanks.  [place]  ----- Message ----- From: Buel Ream, [facility] [place] Sent: 12/16/2019  11:47 AM EDT To: Tish Men, MD

## 2019-12-17 NOTE — Telephone Encounter (Signed)
TCT pt's son regarding TSH lab value from 12/16/19.  Spoke with son, [name] [name] and advised of lab results and advised he call his mother's endocrinologist as soon as possible to have her Synthroid dosage adjusted. He voiced understanding. Provided lab results to him so that he can tell the MD. No questions at this time

## 2019-12-17 NOTE — Telephone Encounter (Signed)
Oncology Nurse Navigator Documentation  Received call from [name], Ms. Stonehouse's partner, asking if I would evaluate Garima's trach stoma while she's in Infusion.  She has been having difficulty reinserting after cleaning and there appears to be "skin tags" in the stoma.  I recommended she call Dr. Jenne Campus office since he placed the trach.  She voiced understanding I would see Jalecia in Infusion, explain same.  Romi voiced understanding as well.  Gayleen Orem, RN, BSN Head & Neck Oncology Nurse [place] at [place] [phone]

## 2019-12-20 DIAGNOSIS — E892 Postprocedural hypoparathyroidism: Secondary | ICD-10-CM | POA: Diagnosis not present

## 2019-12-20 DIAGNOSIS — Z978 Presence of other specified devices: Secondary | ICD-10-CM | POA: Diagnosis not present

## 2019-12-20 DIAGNOSIS — Z6822 Body mass index (BMI) 22.0-22.9, adult: Secondary | ICD-10-CM | POA: Diagnosis not present

## 2019-12-20 DIAGNOSIS — I959 Hypotension, unspecified: Secondary | ICD-10-CM | POA: Diagnosis not present

## 2019-12-20 DIAGNOSIS — C329 Malignant neoplasm of larynx, unspecified: Secondary | ICD-10-CM | POA: Diagnosis not present

## 2019-12-20 DIAGNOSIS — E039 Hypothyroidism, unspecified: Secondary | ICD-10-CM | POA: Diagnosis not present

## 2019-12-24 DIAGNOSIS — R633 Feeding difficulties: Secondary | ICD-10-CM | POA: Diagnosis not present

## 2019-12-24 DIAGNOSIS — C321 Malignant neoplasm of supraglottis: Secondary | ICD-10-CM | POA: Diagnosis not present

## 2019-12-27 ENCOUNTER — Other Ambulatory Visit: Payer: Medicare HMO

## 2019-12-27 ENCOUNTER — Ambulatory Visit: Payer: Medicare HMO

## 2019-12-31 NOTE — Progress Notes (Signed)
Pharmacist Chemotherapy Monitoring - Follow Up Assessment    I verify that I have reviewed each item in the below checklist:  . Regimen for the patient is scheduled for the appropriate day and plan matches scheduled date. Marland Kitchen Appropriate non-routine labs are ordered dependent on drug ordered. . If applicable, additional medications reviewed and ordered per protocol based on lifetime cumulative doses and/or treatment regimen.   Plan for follow-up and/or issues identified: No . I-vent associated with next due treatment: No . MD and/or nursing notified: No  Mikaya Bunner, Jacqlyn Larsen 12/31/2019 2:46 PM

## 2020-01-05 ENCOUNTER — Inpatient Hospital Stay: Payer: Medicare HMO

## 2020-01-05 ENCOUNTER — Encounter: Payer: Self-pay | Admitting: Hematology

## 2020-01-05 ENCOUNTER — Telehealth: Payer: Self-pay | Admitting: *Deleted

## 2020-01-05 ENCOUNTER — Inpatient Hospital Stay: Payer: Medicare HMO | Attending: Hematology | Admitting: Hematology

## 2020-01-05 ENCOUNTER — Other Ambulatory Visit: Payer: Self-pay

## 2020-01-05 ENCOUNTER — Ambulatory Visit: Payer: Medicare HMO

## 2020-01-05 VITALS — BP 102/60 | HR 79 | Temp 98.9°F | Resp 18 | Ht 62.0 in | Wt 117.3 lb

## 2020-01-05 DIAGNOSIS — D6481 Anemia due to antineoplastic chemotherapy: Secondary | ICD-10-CM | POA: Insufficient documentation

## 2020-01-05 DIAGNOSIS — D649 Anemia, unspecified: Secondary | ICD-10-CM

## 2020-01-05 DIAGNOSIS — Z5112 Encounter for antineoplastic immunotherapy: Secondary | ICD-10-CM | POA: Diagnosis not present

## 2020-01-05 DIAGNOSIS — M25512 Pain in left shoulder: Secondary | ICD-10-CM | POA: Insufficient documentation

## 2020-01-05 DIAGNOSIS — C321 Malignant neoplasm of supraglottis: Secondary | ICD-10-CM | POA: Diagnosis not present

## 2020-01-05 DIAGNOSIS — E039 Hypothyroidism, unspecified: Secondary | ICD-10-CM | POA: Diagnosis not present

## 2020-01-05 DIAGNOSIS — Z95828 Presence of other vascular implants and grafts: Secondary | ICD-10-CM

## 2020-01-05 DIAGNOSIS — G893 Neoplasm related pain (acute) (chronic): Secondary | ICD-10-CM | POA: Diagnosis not present

## 2020-01-05 DIAGNOSIS — C329 Malignant neoplasm of larynx, unspecified: Secondary | ICD-10-CM | POA: Diagnosis not present

## 2020-01-05 DIAGNOSIS — T451X5D Adverse effect of antineoplastic and immunosuppressive drugs, subsequent encounter: Secondary | ICD-10-CM | POA: Diagnosis not present

## 2020-01-05 DIAGNOSIS — E46 Unspecified protein-calorie malnutrition: Secondary | ICD-10-CM

## 2020-01-05 LAB — CMP (CANCER CENTER ONLY)
ALT: 16 U/L (ref 0–44)
AST: 14 U/L — ABNORMAL LOW (ref 15–41)
Albumin: 3.4 g/dL — ABNORMAL LOW (ref 3.5–5.0)
Alkaline Phosphatase: 80 U/L (ref 38–126)
Anion gap: 9 (ref 5–15)
BUN: 25 mg/dL — ABNORMAL HIGH (ref 8–23)
CO2: 26 mmol/L (ref 22–32)
Calcium: 8.7 mg/dL — ABNORMAL LOW (ref 8.9–10.3)
Chloride: 104 mmol/L (ref 98–111)
Creatinine: 0.68 mg/dL (ref 0.44–1.00)
GFR, Est AFR Am: >60 mL/min (ref >60)
GFR, Estimated: >60 mL/min (ref >60)
Glucose, Bld: 103 mg/dL — ABNORMAL HIGH (ref 70–99)
Potassium: 4.4 mmol/L (ref 3.5–5.1)
Sodium: 139 mmol/L (ref 135–145)
Total Bilirubin: 0.4 mg/dL (ref 0.3–1.2)
Total Protein: 7.1 g/dL (ref 6.5–8.1)

## 2020-01-05 LAB — CBC WITH DIFFERENTIAL (CANCER CENTER ONLY)
Abs Immature Granulocytes: 0.02 10*3/uL (ref 0.00–0.07)
Basophils Absolute: 0 10*3/uL (ref 0.0–0.1)
Basophils Relative: 0 %
Eosinophils Absolute: 0.2 10*3/uL (ref 0.0–0.5)
Eosinophils Relative: 3 %
HCT: 33.7 % — ABNORMAL LOW (ref 36.0–46.0)
Hemoglobin: 10.4 g/dL — ABNORMAL LOW (ref 12.0–15.0)
Immature Granulocytes: 0 %
Lymphocytes Relative: 20 %
Lymphs Abs: 1 10*3/uL (ref 0.7–4.0)
MCH: 26.1 pg (ref 26.0–34.0)
MCHC: 30.9 g/dL (ref 30.0–36.0)
MCV: 84.7 fL (ref 80.0–100.0)
Monocytes Absolute: 0.5 10*3/uL (ref 0.1–1.0)
Monocytes Relative: 11 %
Neutro Abs: 3.2 10*3/uL (ref 1.7–7.7)
Neutrophils Relative %: 66 %
Platelet Count: 271 10*3/uL (ref 150–400)
RBC: 3.98 MIL/uL (ref 3.87–5.11)
RDW: 13.2 % (ref 11.5–15.5)
WBC Count: 4.9 10*3/uL (ref 4.0–10.5)
nRBC: 0 % (ref 0.0–0.2)

## 2020-01-05 LAB — MAGNESIUM: Magnesium: 2.2 mg/dL (ref 1.7–2.4)

## 2020-01-05 LAB — TSH: TSH: <0.08 u[IU]/mL — ABNORMAL LOW (ref 0.308–3.960)

## 2020-01-05 MED ORDER — SODIUM CHLORIDE 0.9% FLUSH
10.0000 mL | INTRAVENOUS | Status: DC | PRN
  Administered 2020-01-05: 10 mL
  Filled 2020-01-05: qty 10

## 2020-01-05 MED ORDER — MORPHINE SULFATE ER 15 MG PO TBCR: 15.0000 mg | EXTENDED_RELEASE_TABLET | Freq: Every evening | ORAL | 0 refills | Status: AC

## 2020-01-05 NOTE — Telephone Encounter (Signed)
Per Dr. Maylon Peppers, called pt friend [name] to make aware that pt's TSH is extremely low. Advised to see endocrinologist as soon as possible. Pt friend verbalized understanding.

## 2020-01-05 NOTE — Progress Notes (Signed)
[place] OFFICE PROGRESS NOTE  Patient Care Team: Hamrick, Lorin Mercy, MD as PCP - General (Family Medicine) Eppie Gibson, MD as Attending Physician (Radiation Oncology) Tish Men, MD as Consulting Physician (Hematology) Leota Sauers, RN (Inactive) as Oncology Nurse Navigator Karie Mainland, RD as Dietitian (Nutrition) Sharen Counter, CCC-SLP as Speech Language Pathologist (Speech Pathology)  HEME/ONC OVERVIEW: 1.  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 Conley Canal and [place]  - 10/2019:   FDG-avid hypodense lesion near the left hemimandible (SUV 10) w/ b/l neck lesions (max SUV 4), subcentimeter pulmonary nodules below PET detection, suspicious for mets; focal FDG uptake in the R ventricular cavity (SUV 8)  An extrinsic mass compressing the RV and RA, measuring 4.3 cm on TTE; no hemodynamic compromise   Left submandibular LN bx, squamous cell carcinoma; CPS 20, TMB 23.2 m/MB, no targetable mutation on molecular studies  -11/2019: palliative Keytruda  2. Port and PEG in 01/2019   TREATMENT REGIMEN:  12/03/2018: tracheostomy, DL and bx at [facility]   02/11/2019 - 04/06/2019: definitive chemoRT with weekly cisplatin x 7 doses   07/20/2019: total laryngectomy, pharyngectomy, glossectomy, b/l modified neck dissection, and free lap  12/16/2019 - present: 1st line Keytruda  ASSESSMENT & PLAN:   Stage IVA (cT3N2cM0)  squamous cell carcinoma of the supraglottic larynx with disease recurrence  -S/p 1 cycle of Keytruda in the 1st line palliative setting -Patient is tolerating treatment well so far -Labs reviewed and adequate, proceed with Cycle 2 of Keytruda -I have ordered CT neck and chest w/ contrast after Cycle 4 of Keytruda to assess disease response (tentatively in early 02/2020) -Pending the disease response, we will determine if she should continue single-agent Keytruda, or if there is disease progression, then we can consider adding cytotoxic chemotherapy (such as carboplatin/Taxol) to the immunotherapy to achieve a more robust response  -PRN anti-emetics: Zofran and Compazine   Cancer-related pain -Secondary to nerve damage from recent surgery, primarily in the left shoulder  -Currently on MS-Contin 14m qhs with PRN Norco for breakthrough pain -Pain reasonably controlled -Continue the current regimen for now; I have refilled MS-Contin today   Normocytic anemia -Likely multifactorial, including anemia of chronic disease and treatment related side effect -Hgb 10.4 today, stable -Patient denies any symptoms of bleeding -Continue treatment as outlined above   Protein malnutrition -Secondary to chemoradiation and recent surgery  -Weight stable  -Currently taking 4 cans of KPepsiCovia the feeding tube, as well as 1 Boost and ~48 oz of water by mouth  -I encouraged patient to adhere to nutritional recommendations -Due to the feeding tubing collapse, I have ordered replacement of the feeding tube by IR   Hypothyroidism -Currently on Synthroid 1540m daily -TSH remains very low  -I recommended the patient to follow up with her endocrinologist ASAP to discuss Synthroid dose adjustment as needed   Orders Placed This Encounter  Procedures  . IR GASTROSTOMY TUBE MOD SED    Standing Status:   Future    Standing Expiration Date:  03/06/2021    Order Specific Question:   Reason for exam:     Answer:   Replace gastrostomy tube. Tubing malfunction.    Order Specific Question:   Preferred Imaging Location?    Answer:   [facility]  . CT SOFT TISSUE NECK W CONTRAST    Standing Status:   Future    Standing Expiration Date:   01/04/2021    Order Specific Question:   ** REASON FOR EXAM (FREE TEXT)    Answer:   Metastatic laryngeal cancer on Keytruda    Order Specific Question:   If indicated for the ordered procedure, I authorize the administration of contrast media per Radiology protocol    Answer:   Yes    Order Specific Question:   Preferred imaging location?    Answer:   [facility]    Order Specific Question:   Radiology Contrast Protocol - do NOT remove file path    Answer:   \\[email or web address].[email or web address]  . CT CHEST W CONTRAST    Standing Status:   Future    Standing Expiration Date:   01/04/2021    Order Specific Question:   If indicated for the ordered procedure, I authorize the administration of contrast media per Radiology protocol    Answer:   Yes    Order Specific Question:   Preferred imaging location?    Answer:   [facility]    Order Specific Question:   Radiology Contrast Protocol - do NOT remove file path    Answer:   \\[email or web address].[email or web address]  . CT ABDOMEN PELVIS W CONTRAST    Standing Status:   Future    Standing Expiration Date:   01/04/2021    Order Specific Question:   If indicated for the ordered procedure, I authorize the administration of contrast media per Radiology protocol    Answer:   Yes    Order Specific Question:   Preferred imaging location?    Answer:   [facility]    Order Specific Question:   Is Oral Contrast requested for this exam?    Answer:   Yes, Per Radiology protocol    Order Specific Question:   Radiology Contrast Protocol - do NOT remove file path    Answer:   \\[email or web address]   All questions were answered. The patient knows to call the  clinic with any problems, questions or concerns. No barriers to learning was detected.  Return in 3 weeks for Keytruda, and 6 weeks for [facility] and clinic follow-up.   Tish Men, MD 4/14/202111:22 AM  CHIEF COMPLAINT: "I am okay"  INTERVAL HISTORY: Ms. Spear returns to clinic for follow-up of metastatic squamous cell carcinoma of the larynx on Keytruda.  Patient was accompanied by her partner.  She reports having some constipation after the first cycle of treatment, for which she took MiraLAX with relief.  She takes MS-Contin at night for the left shoulder pain, as well as PRN Norco for breakthrough pain.  Her pain is reasonably well controlled.  She is primarily relying on the feeding tube for nutrition, as she has not been able to eat or drink significant amount by mouth.  She denies any other complaint today.  REVIEW OF SYSTEMS:   Constitutional: ( - ) fevers, ( - )  chills , ( - ) night sweats Eyes: ( - ) blurriness of vision, ( - ) double vision, ( - ) watery eyes Ears, nose, mouth, throat, and face: ( - ) mucositis, ( - )  sore throat Respiratory: ( - ) cough, ( - ) dyspnea, ( - ) wheezes Cardiovascular: ( - ) palpitation, ( - ) chest discomfort, ( - ) lower extremity swelling Gastrointestinal:  ( - ) nausea, ( - ) heartburn, ( - ) change in bowel habits Skin: ( - ) abnormal skin rashes Lymphatics: ( - ) new lymphadenopathy, ( - ) easy bruising Neurological: ( - ) numbness, ( - ) tingling, ( - ) new weaknesses Behavioral/Psych: ( - ) mood change, ( - ) new changes  All other systems were reviewed with the patient and are negative.  SUMMARY OF ONCOLOGIC HISTORY: Oncology History  Laryngeal squamous cell carcinoma ([place])  12/02/2018 Imaging   CT neck: IMPRESSION: 1. Supraglottic laryngeal carcinoma extending across the anterior commissure and invading the prelaryngeal fat, left aryepiglottic fold and left aspect of the epiglottis. 2. Severe narrowing of the laryngeal airway. 3.  Bilateral subcentimeter level 2A and 2A cervical lymph nodes.  Critical Value/emergent results were called by telephone at the time of interpretation on 12/02/2018 at 5:34 pm to Dr. Collier Salina, who verbally acknowledged these results.   12/02/2018 Imaging   CT  Chest:  IMPRESSION: No evidence of metastatic disease in the chest.   12/03/2018 Pathology Results   (Care Everywhere; [facility]) A: Larynx, supraglottis, biopsy - Atypical squamous proliferation with at least low grade dysplasia and abundant keratinization - No definite high grade dysplasia or invasive carcinoma represented (see comment)   12/18/2018 Pathology Results   (Care Everywhere; [facility]) A:  Neck, left, fine needle aspiration - Rare single atypical cells, no cells diagnostic of malignancy - Lymph node material present  B:  Neck, right, fine needle aspiration - Non-diagnostic specimen - No lymph node material identified - Mixed inflammation and blood   12/31/2018 Pathology Results   (Care Everywhere; [facility])  A: Larynx, left supraglottic mass, biopsy - Atypical squamous proliferation with abundant keratinization, suspicious for well-differentiated invasive squamous cell carcinoma  B: Larynx, left supraglottic mass, biopsy - Well-differentiated invasive squamous cell carcinoma with abundant keratinization (see comment)   01/18/2019 Initial Diagnosis   Laryngeal cancer ([place])   01/26/2019 Imaging   PET: IMPRESSION: 1. Locally advanced hypermetabolic laryngeal neoplasm centered in the left supraglottic space with bilateral glottic and epiglottic involvement and subglottic extension on the left. 2. Hypermetabolic bilateral level 3 neck nodal metastases. 3. Hypermetabolic high mediastinal nodal metastases between the trachea and upper thoracic esophagus. 4. Otherwise no distant hypermetabolic metastatic disease. 5. Nodular 7 mm focus at the left lung base, below PET resolution, recommend attention  on follow-up chest CT in 3 months. 6. Chronic findings include: Aortic Atherosclerosis (ICD10-I70.0). Cholelithiasis.   02/08/2019 Cancer Staging   Staging form: Larynx - Supraglottis, AJCC 8th Edition - Clinical stage from 02/08/2019: Stage IVA (cT3, cN2c, cM0) - Signed by Eppie Gibson, MD on 02/08/2019   02/11/2019 - 04/07/2019 Chemotherapy   The patient had palonosetron (ALOXI) injection 0.25 mg, 0.25 mg, Intravenous,  Once, 7 of 7 cycles Administration: 0.25 mg (02/11/2019), 0.25 mg (02/18/2019), 0.25 mg (03/04/2019), 0.25 mg (03/11/2019), 0.25 mg (03/18/2019), 0.25 mg (03/25/2019), 0.25 mg (04/01/2019) CISplatin (PLATINOL) 72 mg in sodium chloride 0.9 % 250 mL chemo infusion, 40 mg/m2 = 72 mg, Intravenous,  Once, 7 of 7 cycles Administration: 72 mg (02/11/2019), 72 mg (02/18/2019), 72 mg (03/04/2019), 72 mg (03/11/2019), 72 mg (03/18/2019), 72 mg (03/25/2019), 72 mg (04/01/2019) fosaprepitant (EMEND) 150 mg, dexamethasone (DECADRON) 12 mg in sodium chloride 0.9 % 145 mL IVPB, ,  Intravenous,  Once, 7 of 7 cycles Administration:  (02/11/2019),  (02/18/2019),  (03/04/2019),  (03/11/2019),  (03/18/2019),  (03/25/2019),  (04/01/2019)  for chemotherapy treatment.    07/20/2019 Pathology Results   PROCEDURE: Total glossectomy, total pharyngectomy, total laryngectomy, total thyroidectomy, and cervical esophagectomy TUMOR SITE: Larynx TUMOR LATERALITY: Not specified TUMOR FOCALITY: Unifocal TUMOR SIZE:    GREATEST DIMENSION: 6.9 cm    ADDITIONAL DIMENSIONS: 5.4 x 2.4 cm HISTOLOGIC TYPE: Squamous cell carcinoma, conventional (keratinizing) HISTOLOGIC GRADE:  G1-G2: Well to moderately differentiated TUMOR EXTENSION: Tumor involves the posterior tongue, pharynx, larynx, tracheal cartilage, hyoid bone, cervical esophagus, vagus nerve, carotid artery, deep cervical fascia, ans surrounding skeletal muscle and soft tissue MARGINS: Uninvolved by invasive tumor; Perineural invasion is less than 36m from the left soft tissue  margin LYMPHOVASCULAR INVASION: Not identified PERINEURAL INVASION:  Present REGIONAL LYMPH NODES:     NUMBER OF LYMPH NODES INVOLVED: 2     NUMBER OF LYMPH NODES EXAMINED: 24     LATERALITY OF LYMPH NODES INVOLVED: Cannot determine     SIZE OF LARGEST METASTATIC DEPOSIT: 0.8 cm     EXTRANODAL EXTENSION: Not identified PATHOLOGIC STAGE CLASSIFICATION (pTNM, AJCC 8TH Ed): (r)(y) pT4b pN2a DISTANT METASTASIS (pM): ADDITIONAL PATHOLOGIC FINDINGS: Previous chemoradiation   11/04/2019 Imaging   PET: 1.  Hypermetabolic bilateral neck lesions as detailed above are concerning for disease recurrence. 2.  Compared to 07/08/2019 PET scan, interval development of subcentimeter bilateral upper lobe pulmonary nodules, size below PET resolution. These are concerning for metastasis. Consider diagnostic quality CT chest exam for further evaluation as per oncology treatment planning. 3.  Focal hypermetabolic uptake in right ventricular cavity of indeterminate significance. Recommend echocardiographic examination to rule out underlying thrombus. This finding was discussed with Dr. SConley Canalby Dr. GClementeen Grahamand Dr. TMarcello Mooreson 11/04/2019 at approximately 3:00 PM via telephone. 4.  Ancillary CT findings as above.   11/05/2019 Imaging   Echocardiogram: Summary   1. The left ventricle is normal in size with normal wall thickness.   2. The left ventricular systolic function is normal, LVEF is visually estimated at 60-65%.   3. The right ventricle is normal in size, with normal systolic function.   4. There is an extrinsic compression of the RV and RA laterally at the AV groove by a mass of 4.3 cm diameter. Unclear if the mass is invasive, although no pericardial effusion seen suggestive no invasion possibly. There is no evidence of hemodynamic compromise secondary to cardiac compression with normal IVC diameter.   12/16/2019 -  Chemotherapy   The patient had pembrolizumab (KEYTRUDA) 200 mg in sodium chloride 0.9 %  50 mL chemo infusion, 200 mg, Intravenous, Once, 1 of 6 cycles Administration: 200 mg (12/16/2019)  for chemotherapy treatment.      I have reviewed the past medical history, past surgical history, social history and family history with the patient and they are unchanged from previous note.  ALLERGIES:  has No Known Allergies.  MEDICATIONS:  Current Outpatient Medications  Medication Sig Dispense Refill  . chlorhexidine (PERIDEX) 0.12 % solution Rinse with 15 mls twice daily for 30 seconds. Use after breakfast and at bedtime. Spit out excess. Do not swallow. (Patient taking differently: Use as directed 15 mLs in the mouth or throat 2 (two) times daily. Rinse with 15 mls twice daily for 30 seconds. Use after breakfast and at bedtime. Spit out excess. Do not swallow.) 480 mL prn  . docusate sodium (COLACE) 100 MG capsule Take 100 mg by  mouth 2 (two) times daily.    Marland Kitchen HYDROcodone-acetaminophen (NORCO) 5-325 MG tablet Take 1 tablet by mouth every 8 (eight) hours as needed for moderate pain. 60 tablet 0  . levothyroxine (SYNTHROID) 175 MCG tablet Take 200 mcg by mouth every morning.     . lidocaine (XYLOCAINE) 2 % solution Patient: Mix 1part 2% viscous lidocaine, 1part H20. Swallow 69m of diluted mixture, 374m before meals and at bedtime, up to QID prn soreness 100 mL 5  . lidocaine-prilocaine (EMLA) cream Apply to affected area once 30 g 3  . morphine (MS CONTIN) 15 MG 12 hr tablet Take 1 tablet (15 mg total) by mouth at bedtime. 60 tablet 0  . Nutritional Supplements (KATE FARMS PEPTIDE 1.5) LIQD Take 4 Bottles by mouth daily. 325 mL 6  . ondansetron (ZOFRAN) 8 MG tablet Take 1 tablet (8 mg total) by mouth 2 (two) times daily as needed (Nausea or vomiting). 30 tablet 1  . prochlorperazine (COMPAZINE) 10 MG tablet Take 1 tablet (10 mg total) by mouth every 6 (six) hours as needed (Nausea or vomiting). 30 tablet 1  . sodium fluoride (PREVIDENT 5000 PLUS) 1.1 % CREA dental cream Apply cream to  tooth brush. Brush teeth for 2 minutes. Spit out excess. DO NOT rinse afterwards. Repeat nightly. (Patient taking differently: Place 1 application onto teeth 2 (two) times a day. ) 1 Tube prn   No current facility-administered medications for this visit.    PHYSICAL EXAMINATION: ECOG PERFORMANCE STATUS: 1 - Symptomatic but completely ambulatory  Today's Vitals   01/05/20 1027 01/05/20 1034  BP: 102/60   Pulse: 79   Resp: 18   Temp: 98.9 F (37.2 C)   TempSrc: Temporal   SpO2: 100%   Weight: 117 lb 4.8 oz (53.2 kg)   Height: 5' 2"  (1.575 m)   PainSc:  0-No pain   Body mass index is 21.45 kg/m.  Filed Weights   01/05/20 1027  Weight: 117 lb 4.8 oz (53.2 kg)    GENERAL: alert, no distress and comfortable SKIN: skin color, texture, turgor are normal, no rashes or significant lesions EYES: conjunctiva are pink and non-injected, sclera clear OROPHARYNX: moderate trismus, stable; limited visualization of oral cavity   NECK: supple, non-tender LUNGS: clear to auscultation with normal breathing effort HEART: regular rate & rhythm and no murmurs and no lower extremity edema ABDOMEN: soft, non-tender, non-distended, normal bowel sounds Musculoskeletal: no cyanosis of digits and no clubbing  PSYCH: alert, non-verbal due to laryngectomy   LABORATORY DATA:  I have reviewed the data as listed    Component Value Date/Time   NA 139 01/05/2020 1020   K 4.4 01/05/2020 1020   CL 104 01/05/2020 1020   CO2 26 01/05/2020 1020   GLUCOSE 103 (H) 01/05/2020 1020   BUN 25 (H) 01/05/2020 1020   CREATININE 0.68 01/05/2020 1020   CALCIUM 8.7 (L) 01/05/2020 1020   PROT 7.1 01/05/2020 1020   ALBUMIN 3.4 (L) 01/05/2020 1020   AST 14 (L) 01/05/2020 1020   ALT 16 01/05/2020 1020   ALKPHOS 80 01/05/2020 1020   BILITOT 0.4 01/05/2020 1020   GFRNONAA >60 01/05/2020 1020   GFRAA >60 01/05/2020 1020    No results found for: SPEP, UPEP  Lab Results  Component Value Date   WBC 4.9 01/05/2020    NEUTROABS 3.2 01/05/2020   HGB 10.4 (L) 01/05/2020   HCT 33.7 (L) 01/05/2020   MCV 84.7 01/05/2020   PLT 271 01/05/2020  Chemistry      Component Value Date/Time   NA 139 01/05/2020 1020   K 4.4 01/05/2020 1020   CL 104 01/05/2020 1020   CO2 26 01/05/2020 1020   BUN 25 (H) 01/05/2020 1020   CREATININE 0.68 01/05/2020 1020      Component Value Date/Time   CALCIUM 8.7 (L) 01/05/2020 1020   ALKPHOS 80 01/05/2020 1020   AST 14 (L) 01/05/2020 1020   ALT 16 01/05/2020 1020   BILITOT 0.4 01/05/2020 1020       RADIOGRAPHIC STUDIES: I have personally reviewed the radiological images as listed below and agreed with the findings in the report. [place] CORE BIOPSY (LYMPH NODES)  Result Date: 12/06/2019 INDICATION: 69 year old female with a history of laryngeal cancer and recurrent centrally necrotic but peripherally hypermetabolic cervical adenopathy concerning for recurrent metastatic disease. EXAM: ULTRASOUND CORE BIOPSY OF THE LYMPH NODES MEDICATIONS: None. ANESTHESIA/SEDATION: Moderate (conscious) sedation was employed during this procedure. A total of Versed 3 mg and Fentanyl 75 mcg was administered intravenously. Moderate Sedation Time: 17 minutes. The patient's level of consciousness and vital signs were monitored continuously by radiology nursing throughout the procedure under my direct supervision. FLUOROSCOPY TIME:  None. COMPLICATIONS: None immediate. PROCEDURE: Informed written consent was obtained from the patient after a thorough discussion of the procedural risks, benefits and alternatives. All questions were addressed. Maximal Sterile Barrier Technique was utilized including caps, mask, sterile gowns, sterile gloves, sterile drape, hand hygiene and skin antiseptic. A timeout was performed prior to the initiation of the procedure. Local anesthesia was attained by infiltration with 1% lidocaine. A small dermatotomy was made. With some difficulty, multiple 18 gauge core biopsies  were attained using the Bard mission automated biopsy device. The majority the biopsy specimens were fragmented. Two specimens were fairly intact. Effort was made to target the periphery of the lymph node given that the central aspect is likely necrotic. Post biopsy ultrasound imaging demonstrates no evidence of immediate complication. IMPRESSION: Successful ultrasound-guided core biopsy of centrally necrotic left submandibular lymph node. Of note, due to extensive prior postsurgical and post radiation changes, patient has very limited mobility in the neck and the biopsy was extremely challenging. Optimal efforts were made to biopsy any active tissue in the periphery of this lymph node. If biopsy specimen comes back as nondiagnostic or necrotic debris, repeat biopsy is highly unlikely to offer any further information. The smaller and less hypermetabolic lymph nodes on the right could not be convincingly identified sonographically due to extensive skin changes and limited mobility. Signed, Criselda Peaches, MD, [facility] Electronically Signed   By: Jacqulynn Cadet M.D.   On: 12/06/2019 14:39

## 2020-01-06 ENCOUNTER — Other Ambulatory Visit: Payer: Self-pay | Admitting: Hematology

## 2020-01-06 ENCOUNTER — Encounter (HOSPITAL_COMMUNITY): Payer: Self-pay | Admitting: Interventional Radiology

## 2020-01-06 ENCOUNTER — Inpatient Hospital Stay: Payer: Medicare HMO

## 2020-01-06 ENCOUNTER — Ambulatory Visit (HOSPITAL_COMMUNITY)
Admission: RE | Admit: 2020-01-06 | Discharge: 2020-01-06 | Disposition: A | Discharge status: Home / Self Care | Payer: Medicare HMO | Source: Ambulatory Visit | Attending: Hematology | Admitting: Hematology

## 2020-01-06 ENCOUNTER — Other Ambulatory Visit: Payer: Self-pay

## 2020-01-06 VITALS — BP 101/56 | HR 70 | Temp 98.5°F | Resp 18

## 2020-01-06 DIAGNOSIS — C329 Malignant neoplasm of larynx, unspecified: Secondary | ICD-10-CM

## 2020-01-06 DIAGNOSIS — Z5112 Encounter for antineoplastic immunotherapy: Secondary | ICD-10-CM | POA: Diagnosis not present

## 2020-01-06 HISTORY — PX: IR PATIENT EVAL TECH 0-60 MINS: IMG5564

## 2020-01-06 LAB — T4: T4, Total: 8.2 ug/dL (ref 4.5–12.0)

## 2020-01-06 MED ORDER — SODIUM CHLORIDE 0.9% FLUSH
10.0000 mL | INTRAVENOUS | Status: DC | PRN
  Administered 2020-01-06: 10 mL
  Filled 2020-01-06: qty 10

## 2020-01-06 MED ORDER — SODIUM CHLORIDE 0.9 % IV SOLN
Freq: Once | INTRAVENOUS | Status: AC
  Filled 2020-01-06: qty 250

## 2020-01-06 MED ORDER — HEPARIN SOD (PORK) LOCK FLUSH 100 UNIT/ML IV SOLN
500.0000 [IU] | Freq: Once | INTRAVENOUS | Status: AC | PRN
  Administered 2020-01-06: 500 [IU]
  Filled 2020-01-06: qty 5

## 2020-01-06 MED ORDER — SODIUM CHLORIDE 0.9 % IV SOLN
200.0000 mg | Freq: Once | INTRAVENOUS | Status: AC
  Administered 2020-01-06: 200 mg via INTRAVENOUS
  Filled 2020-01-06: qty 8

## 2020-01-06 NOTE — Patient Instructions (Signed)
Pembrolizumab injection What is this medicine? PEMBROLIZUMAB (pem broe liz ue mab) is a monoclonal antibody. It is used to treat certain types of cancer. This medicine may be used for other purposes; ask your health care provider or pharmacist if you have questions. COMMON BRAND NAME(S): Keytruda What should I tell my health care provider before I take this medicine? They need to know if you have any of these conditions:  diabetes  immune system problems  inflammatory bowel disease  liver disease  lung or breathing disease  lupus  received or scheduled to receive an organ transplant or a stem-cell transplant that uses donor stem cells  an unusual or allergic reaction to pembrolizumab, other medicines, foods, dyes, or preservatives  pregnant or trying to get pregnant  breast-feeding How should I use this medicine? This medicine is for infusion into a vein. It is given by a health care professional in a hospital or clinic setting. A special MedGuide will be given to you before each treatment. Be sure to read this information carefully each time. Talk to your pediatrician regarding the use of this medicine in children. While this drug may be prescribed for children as young as 6 months for selected conditions, precautions do apply. Overdosage: If you think you have taken too much of this medicine contact a poison control center or emergency room at once. NOTE: This medicine is only for you. Do not share this medicine with others. What if I miss a dose? It is important not to miss your dose. Call your doctor or health care professional if you are unable to keep an appointment. What may interact with this medicine? Interactions have not been studied. Give your health care provider a list of all the medicines, herbs, non-prescription drugs, or dietary supplements you use. Also tell them if you smoke, drink alcohol, or use illegal drugs. Some items may interact with your medicine. This  list may not describe all possible interactions. Give your health care provider a list of all the medicines, herbs, non-prescription drugs, or dietary supplements you use. Also tell them if you smoke, drink alcohol, or use illegal drugs. Some items may interact with your medicine. What should I watch for while using this medicine? Your condition will be monitored carefully while you are receiving this medicine. You may need blood work done while you are taking this medicine. Do not become pregnant while taking this medicine or for 4 months after stopping it. Women should inform their doctor if they wish to become pregnant or think they might be pregnant. There is a potential for serious side effects to an unborn child. Talk to your health care professional or pharmacist for more information. Do not breast-feed an infant while taking this medicine or for 4 months after the last dose. What side effects may I notice from receiving this medicine? Side effects that you should report to your doctor or health care professional as soon as possible:  allergic reactions like skin rash, itching or hives, swelling of the face, lips, or tongue  bloody or black, tarry  breathing problems  changes in vision  chest pain  chills  confusion  constipation  cough  diarrhea  dizziness or feeling faint or lightheaded  fast or irregular heartbeat  fever  flushing  joint pain  low blood counts - this medicine may decrease the number of white blood cells, red blood cells and platelets. You may be at increased risk for infections and bleeding.  muscle pain  muscle   weakness  pain, tingling, numbness in the hands or feet  persistent headache  redness, blistering, peeling or loosening of the skin, including inside the mouth  signs and symptoms of high blood sugar such as dizziness; dry mouth; dry skin; fruity breath; nausea; stomach pain; increased hunger or thirst; increased urination  signs  and symptoms of kidney injury like trouble passing urine or change in the amount of urine  signs and symptoms of liver injury like dark urine, light-colored stools, loss of appetite, nausea, right upper belly pain, yellowing of the eyes or skin  sweating  swollen lymph nodes  weight loss Side effects that usually do not require medical attention (report to your doctor or health care professional if they continue or are bothersome):  decreased appetite  hair loss  muscle pain  tiredness This list may not describe all possible side effects. Call your doctor for medical advice about side effects. You may report side effects to FDA at 1-800-FDA-1088. Where should I keep my medicine? This drug is given in a hospital or clinic and will not be stored at home. NOTE: This sheet is a summary. It may not cover all possible information. If you have questions about this medicine, talk to your doctor, pharmacist, or health care provider.  2020 Elsevier/Gold Standard (2019-07-16 18:07:58)  

## 2020-01-06 NOTE — Procedures (Signed)
Patient and her son came in today with concerns regarding the appearance of her g tube.  I evaluated the tube.  The distal 6-8  inches of the tube did appear worn and decompressed.  The proximal 6-8 inches appeared unaffected.  The patient states that it does not affect the function. I consulted with Dr Annamaria Boots.  I gave her options to cut the decompressed portion off or replace the entire tube with a replacement balloon or monarch dome tube.  Both of the replacement option are much shorter tube.  The patient requests keeping the current tube since the length helps her bolus feed. I advised her that if the proximal part of the tube starts to look decompressed or if the function of the tube becomes compromised then we should exchange it. Both the patient and her son were satisfied with leaving the current tube in place for now.  They will contact us with any further issue.

## 2020-01-13 ENCOUNTER — Telehealth: Payer: Self-pay | Admitting: Hematology and Oncology

## 2020-01-13 NOTE — Telephone Encounter (Signed)
Scheduled appt per 4/22 staff msg. Pt's partner confirmed appt date and time.

## 2020-01-17 DIAGNOSIS — Z430 Encounter for attention to tracheostomy: Secondary | ICD-10-CM | POA: Diagnosis not present

## 2020-01-21 NOTE — Progress Notes (Signed)
Pharmacist Chemotherapy Monitoring - Follow Up Assessment    I verify that I have reviewed each item in the below checklist:  . Regimen for the patient is scheduled for the appropriate day and plan matches scheduled date. Marland Kitchen Appropriate non-routine labs are ordered dependent on drug ordered. . If applicable, additional medications reviewed and ordered per protocol based on lifetime cumulative doses and/or treatment regimen.   Plan for follow-up and/or issues identified: No . I-vent associated with next due treatment: No . MD and/or nursing notified: No  [name] [name] [facility] 01/21/2020 7:54 AM

## 2020-01-26 ENCOUNTER — Other Ambulatory Visit: Payer: Medicare HMO

## 2020-01-26 ENCOUNTER — Ambulatory Visit: Payer: Medicare HMO | Admitting: Hematology

## 2020-01-26 ENCOUNTER — Ambulatory Visit: Payer: Medicare HMO

## 2020-01-26 ENCOUNTER — Encounter: Payer: Medicare HMO | Admitting: Nutrition

## 2020-01-27 ENCOUNTER — Inpatient Hospital Stay: Payer: Medicare HMO | Admitting: Hematology and Oncology

## 2020-01-27 ENCOUNTER — Telehealth: Payer: Self-pay | Admitting: Hematology and Oncology

## 2020-01-27 ENCOUNTER — Encounter: Payer: Self-pay | Admitting: Hematology and Oncology

## 2020-01-27 ENCOUNTER — Inpatient Hospital Stay: Payer: Medicare HMO | Admitting: Nutrition

## 2020-01-27 ENCOUNTER — Ambulatory Visit: Payer: Medicare HMO

## 2020-01-27 ENCOUNTER — Other Ambulatory Visit: Payer: Self-pay | Admitting: Hematology

## 2020-01-27 ENCOUNTER — Inpatient Hospital Stay: Payer: Medicare HMO

## 2020-01-27 ENCOUNTER — Other Ambulatory Visit: Payer: Self-pay

## 2020-01-27 ENCOUNTER — Inpatient Hospital Stay: Payer: Medicare HMO | Attending: Hematology

## 2020-01-27 VITALS — BP 122/84 | HR 94 | Temp 98.5°F | Resp 18 | Ht 62.0 in | Wt 118.8 lb

## 2020-01-27 DIAGNOSIS — E039 Hypothyroidism, unspecified: Secondary | ICD-10-CM | POA: Insufficient documentation

## 2020-01-27 DIAGNOSIS — Z87891 Personal history of nicotine dependence: Secondary | ICD-10-CM | POA: Diagnosis not present

## 2020-01-27 DIAGNOSIS — C329 Malignant neoplasm of larynx, unspecified: Secondary | ICD-10-CM

## 2020-01-27 DIAGNOSIS — C321 Malignant neoplasm of supraglottis: Secondary | ICD-10-CM | POA: Diagnosis present

## 2020-01-27 DIAGNOSIS — Z9221 Personal history of antineoplastic chemotherapy: Secondary | ICD-10-CM | POA: Diagnosis not present

## 2020-01-27 DIAGNOSIS — Z7189 Other specified counseling: Secondary | ICD-10-CM

## 2020-01-27 DIAGNOSIS — D61818 Other pancytopenia: Secondary | ICD-10-CM | POA: Diagnosis not present

## 2020-01-27 DIAGNOSIS — D539 Nutritional anemia, unspecified: Secondary | ICD-10-CM

## 2020-01-27 DIAGNOSIS — G893 Neoplasm related pain (acute) (chronic): Secondary | ICD-10-CM | POA: Diagnosis not present

## 2020-01-27 DIAGNOSIS — Z95828 Presence of other vascular implants and grafts: Secondary | ICD-10-CM

## 2020-01-27 DIAGNOSIS — Z5112 Encounter for antineoplastic immunotherapy: Secondary | ICD-10-CM | POA: Insufficient documentation

## 2020-01-27 LAB — CBC WITH DIFFERENTIAL (CANCER CENTER ONLY)
Abs Immature Granulocytes: 0.01 10*3/uL (ref 0.00–0.07)
Basophils Absolute: 0 10*3/uL (ref 0.0–0.1)
Basophils Relative: 0 %
Eosinophils Absolute: 0.1 10*3/uL (ref 0.0–0.5)
Eosinophils Relative: 3 %
HCT: 36.3 % (ref 36.0–46.0)
Hemoglobin: 11.3 g/dL — ABNORMAL LOW (ref 12.0–15.0)
Immature Granulocytes: 0 %
Lymphocytes Relative: 30 %
Lymphs Abs: 1.1 10*3/uL (ref 0.7–4.0)
MCH: 26.2 pg (ref 26.0–34.0)
MCHC: 31.1 g/dL (ref 30.0–36.0)
MCV: 84.2 fL (ref 80.0–100.0)
Monocytes Absolute: 0.5 10*3/uL (ref 0.1–1.0)
Monocytes Relative: 14 %
Neutro Abs: 1.9 10*3/uL (ref 1.7–7.7)
Neutrophils Relative %: 53 %
Platelet Count: 227 10*3/uL (ref 150–400)
RBC: 4.31 MIL/uL (ref 3.87–5.11)
RDW: 15.7 % — ABNORMAL HIGH (ref 11.5–15.5)
WBC Count: 3.6 10*3/uL — ABNORMAL LOW (ref 4.0–10.5)
nRBC: 0 % (ref 0.0–0.2)

## 2020-01-27 LAB — CMP (CANCER CENTER ONLY)
ALT: 23 U/L (ref 0–44)
AST: 20 U/L (ref 15–41)
Albumin: 3.9 g/dL (ref 3.5–5.0)
Alkaline Phosphatase: 75 U/L (ref 38–126)
Anion gap: 12 (ref 5–15)
BUN: 27 mg/dL — ABNORMAL HIGH (ref 8–23)
CO2: 26 mmol/L (ref 22–32)
Calcium: 9.1 mg/dL (ref 8.9–10.3)
Chloride: 103 mmol/L (ref 98–111)
Creatinine: 0.7 mg/dL (ref 0.44–1.00)
GFR, Est AFR Am: >60 mL/min (ref >60)
GFR, Estimated: >60 mL/min (ref >60)
Glucose, Bld: 94 mg/dL (ref 70–99)
Potassium: 4 mmol/L (ref 3.5–5.1)
Sodium: 141 mmol/L (ref 135–145)
Total Bilirubin: 0.4 mg/dL (ref 0.3–1.2)
Total Protein: 7.4 g/dL (ref 6.5–8.1)

## 2020-01-27 LAB — MAGNESIUM: Magnesium: 2.1 mg/dL (ref 1.7–2.4)

## 2020-01-27 LAB — TSH: TSH: 0.16 u[IU]/mL — ABNORMAL LOW (ref 0.308–3.960)

## 2020-01-27 MED ORDER — SODIUM CHLORIDE 0.9% FLUSH
10.0000 mL | INTRAVENOUS | Status: DC | PRN
  Administered 2020-01-27: 10 mL
  Filled 2020-01-27: qty 10

## 2020-01-27 MED ORDER — SODIUM CHLORIDE 0.9 % IV SOLN
200.0000 mg | Freq: Once | INTRAVENOUS | Status: AC
  Administered 2020-01-27: 200 mg via INTRAVENOUS
  Filled 2020-01-27: qty 8

## 2020-01-27 MED ORDER — HEPARIN SOD (PORK) LOCK FLUSH 100 UNIT/ML IV SOLN
500.0000 [IU] | Freq: Once | INTRAVENOUS | Status: AC | PRN
  Administered 2020-01-27: 500 [IU]
  Filled 2020-01-27: qty 5

## 2020-01-27 MED ORDER — SODIUM CHLORIDE 0.9 % IV SOLN
Freq: Once | INTRAVENOUS | Status: AC
  Filled 2020-01-27: qty 250

## 2020-01-27 NOTE — Assessment & Plan Note (Signed)
So far, she tolerated treatment well I recommend her to get minimum 4 cycles of treatment before repeat imaging She is in agreement

## 2020-01-27 NOTE — Assessment & Plan Note (Signed)
This is likely due to prior radiation exposure We will continue to check her thyroid level and adjust medicine as needed

## 2020-01-27 NOTE — Assessment & Plan Note (Signed)
She is aware that treatment goal is palliative

## 2020-01-27 NOTE — Assessment & Plan Note (Signed)
This is multifactorial, likely secondary to her prior treatment Due to potential risk of malnutrition, I will check iron studies and B12 in her next visit

## 2020-01-27 NOTE — Progress Notes (Signed)
[place] progress notes  Patient Care Team: Hamrick, Lorin Mercy, MD as PCP - General (Family Medicine) Eppie Gibson, MD as Attending Physician (Radiation Oncology) Tish Men, MD as Consulting Physician (Hematology) Leota Sauers, RN (Inactive) as Oncology Nurse Navigator Karie Mainland, RD as Dietitian (Nutrition) Valentino Saxon Perry Mount, CCC-SLP as Speech Language Pathologist (Speech Pathology) Malmfelt, Stephani Police, RN as Oncology Nurse Navigator (Oncology)  CHIEF COMPLAINTS/PURPOSE OF VISIT:  Recurrent laryngeal cancer, on treatment  HISTORY OF PRESENTING ILLNESS:  [name] [name] 69 y.o. female was transferred to my care after her prior physician is leaving. The patient had tracheostomy and not verbal in communication but able to write down questions and answer questions appropriately  I reviewed the patient's records extensive and collaborated the history with the patient. Summary of her history is as follows: Oncology History  Laryngeal squamous cell carcinoma ([place])  12/02/2018 Imaging   CT neck: IMPRESSION: 1. Supraglottic laryngeal carcinoma extending across the anterior commissure and invading the prelaryngeal fat, left aryepiglottic fold and left aspect of the epiglottis. 2. Severe narrowing of the laryngeal airway. 3. Bilateral subcentimeter level 2A and 2A cervical lymph nodes.     12/02/2018 Imaging   CT  Chest:  IMPRESSION: No evidence of metastatic disease in the chest.   12/03/2018 Pathology Results   (Care Everywhere; [facility]) A: Larynx, supraglottis, biopsy - Atypical squamous proliferation with at least low grade dysplasia and abundant keratinization - No definite high grade dysplasia or invasive carcinoma represented (see comment)   12/18/2018 Pathology Results   (Care Everywhere; [facility]) A:  Neck, left, fine needle aspiration - Rare single atypical cells, no cells diagnostic of malignancy - Lymph node material present  B:   Neck, right, fine needle aspiration - Non-diagnostic specimen - No lymph node material identified - Mixed inflammation and blood   12/31/2018 Pathology Results   (Care Everywhere; [facility])  A: Larynx, left supraglottic mass, biopsy - Atypical squamous proliferation with abundant keratinization, suspicious for well-differentiated invasive squamous cell carcinoma  B: Larynx, left supraglottic mass, biopsy - Well-differentiated invasive squamous cell carcinoma with abundant keratinization (see comment)   01/18/2019 Initial Diagnosis   Laryngeal cancer ([place])   01/26/2019 Imaging   1. Locally advanced hypermetabolic laryngeal neoplasm centered in the left supraglottic space with bilateral glottic and epiglottic involvement and subglottic extension on the left. 2. Hypermetabolic bilateral level 3 neck nodal metastases. 3. Hypermetabolic high mediastinal nodal metastases between the trachea and upper thoracic esophagus. 4. Otherwise no distant hypermetabolic metastatic disease. 5. Nodular 7 mm focus at the left lung base, below PET resolution, recommend attention on follow-up chest CT in 3 months. 6. Chronic findings include: Aortic Atherosclerosis (ICD10-I70.0). Cholelithiasis.   02/08/2019 Cancer Staging   Staging form: Larynx - Supraglottis, AJCC 8th Edition - Clinical stage from 02/08/2019: Stage IVA (cT3, cN2c, cM0) - Signed by Eppie Gibson, MD on 02/08/2019   02/11/2019 - 04/07/2019 Chemotherapy   The patient weekly cisplatin for chemotherapy treatment.     07/20/2019 Pathology Results   PROCEDURE: Total glossectomy, total pharyngectomy, total laryngectomy, total thyroidectomy, and cervical esophagectomy TUMOR SITE: Larynx TUMOR LATERALITY: Not specified TUMOR FOCALITY: Unifocal TUMOR SIZE:    GREATEST DIMENSION: 6.9 cm    ADDITIONAL DIMENSIONS: 5.4 x 2.4 cm HISTOLOGIC TYPE: Squamous cell carcinoma, conventional (keratinizing) HISTOLOGIC GRADE:  G1-G2: Well to moderately  differentiated TUMOR EXTENSION: Tumor involves the posterior tongue, pharynx, larynx, tracheal cartilage, hyoid bone, cervical esophagus, vagus nerve, carotid artery, deep cervical  fascia, ans surrounding skeletal muscle and soft tissue MARGINS: Uninvolved by invasive tumor; Perineural invasion is less than 59mm from the left soft tissue margin LYMPHOVASCULAR INVASION: Not identified PERINEURAL INVASION:  Present REGIONAL LYMPH NODES:     NUMBER OF LYMPH NODES INVOLVED: 2     NUMBER OF LYMPH NODES EXAMINED: 24     LATERALITY OF LYMPH NODES INVOLVED: Cannot determine     SIZE OF LARGEST METASTATIC DEPOSIT: 0.8 cm     EXTRANODAL EXTENSION: Not identified PATHOLOGIC STAGE CLASSIFICATION (pTNM, AJCC 8TH Ed): (r)(y) pT4b pN2a DISTANT METASTASIS (pM): ADDITIONAL PATHOLOGIC FINDINGS: Previous chemoradiation   11/04/2019 Imaging   PET: 1.  Hypermetabolic bilateral neck lesions as detailed above are concerning for disease recurrence. 2.  Compared to 07/08/2019 PET scan, interval development of subcentimeter bilateral upper lobe pulmonary nodules, size below PET resolution. These are concerning for metastasis. Consider diagnostic quality CT chest exam for further evaluation as per oncology treatment planning. 3.  Focal hypermetabolic uptake in right ventricular cavity of indeterminate significance. Recommend echocardiographic examination to rule out underlying thrombus. This finding was discussed with Dr. Conley Canal by Dr. Clementeen Graham and Dr. Marcello Moores on 11/04/2019 at approximately 3:00 PM via telephone. 4.  Ancillary CT findings as above.   11/05/2019 Imaging   Echocardiogram: Summary   1. The left ventricle is normal in size with normal wall thickness.   2. The left ventricular systolic function is normal, LVEF is visually estimated at 60-65%.   3. The right ventricle is normal in size, with normal systolic function.   4. There is an extrinsic compression of the RV and RA laterally at the AV groove by a  mass of 4.3 cm diameter. Unclear if the mass is invasive, although no pericardial effusion seen suggestive no invasion possibly. There is no evidence of hemodynamic compromise secondary to cardiac compression with normal IVC diameter.   12/06/2019 Pathology Results   A. LYMPH NODE, LEFT SUBMANDIBULAR, BIOPSY:  - Squamous cell carcinoma.  - No distinct nodal tissue identified.    12/06/2019 Procedure   Successful ultrasound-guided core biopsy of centrally necrotic left submandibular lymph node. Of note, due to extensive prior postsurgical and post radiation changes, patient has very limited mobility in the neck and the biopsy was extremely challenging    12/16/2019 -  Chemotherapy   The patient had pembrolizumab for chemotherapy treatment.       MEDICAL HISTORY:  Past Medical History:  Diagnosis Date  . Cataract   . History of radiation therapy 02/11/19- 04/06/19   bilateral neck nodes and upper mediastinum  . Laryngeal cancer ([place]) 11/2018  . Thyroid disease     SURGICAL HISTORY: Past Surgical History:  Procedure Laterality Date  . CATARACT EXTRACTION, BILATERAL  left 05/20/16, right 06/03/16   Cataract Extraction w/ IOL Implant; Surgeon: Sudie Grumbling, MD; Location: [facility]; Service: Ophthalmology   . CESAREAN SECTION    . ESOPHAGOGASTRODUODENOSCOPY (EGD) WITH PROPOFOL N/A 02/01/2019   Procedure: ESOPHAGOGASTRODUODENOSCOPY (EGD) WITH PROPOFOL;  Surgeon: Milus Banister, MD;  Location: [facility];  Service: Endoscopy;  Laterality: N/A;  . EUS N/A 02/01/2019   Procedure: UPPER ENDOSCOPIC ULTRASOUND (EUS) RADIAL-Will need Linear and Forward Viewing EUS Scope;  Surgeon: Milus Banister, MD;  Location: [facility];  Service: Endoscopy;  Laterality: N/A;  . FINE NEEDLE ASPIRATION N/A 02/01/2019   Procedure: FINE NEEDLE ASPIRATION (FNA) LINEAR;  Surgeon: Milus Banister, MD;  Location: [facility];  Service: Endoscopy;  Laterality: N/A;  . IR GASTROSTOMY TUBE MOD SED  02/09/2019   . IR IMAGING GUIDED PORT INSERTION  02/02/2019  . IR PATIENT EVAL TECH 0-60 MINS  01/06/2020  . KNEE CARTILAGE SURGERY Right 2019  . TRACHEOSTOMY  12/02/2018   [facility]    SOCIAL HISTORY: Social History   Socioeconomic History  . Marital status: Significant Other    Spouse name: [name]  . Number of children: 1  . Years of education: Not on file  . Highest education level: Not on file  Occupational History  . Not on file  Tobacco Use  . Smoking status: Former Smoker    Types: Cigarettes    Quit date: 12/02/2018    Years since quitting: 1.1  . Smokeless tobacco: Never Used  Substance and Sexual Activity  . Alcohol use: Not Currently  . Drug use: Never  . Sexual activity: Not on file  Other Topics Concern  . Not on file  Social History Narrative  . Not on file   Social Determinants of Health   Financial Resource Strain:   . Difficulty of Paying Living Expenses:   Food Insecurity:   . Worried About Charity fundraiser in the Last Year:   . Arboriculturist in the Last Year:   Transportation Needs:   . Film/video editor (Medical):   Marland Kitchen Lack of Transportation (Non-Medical):   Physical Activity:   . Days of Exercise per Week:   . Minutes of Exercise per Session:   Stress:   . Feeling of Stress :   Social Connections:   . Frequency of Communication with Friends and Family:   . Frequency of Social Gatherings with Friends and Family:   . Attends Religious Services:   . Active Member of Clubs or Organizations:   . Attends Archivist Meetings:   Marland Kitchen Marital Status:   Intimate Partner Violence:   . Fear of Current or Ex-Partner:   . Emotionally Abused:   Marland Kitchen Physically Abused:   . Sexually Abused:     FAMILY HISTORY: Family History  Problem Relation Age of Onset  . Cancer Mother   . Heart attack Father     ALLERGIES:  has No Known Allergies.  MEDICATIONS:  Current Outpatient Medications  Medication Sig Dispense Refill  . chlorhexidine  (PERIDEX) 0.12 % solution Rinse with 15 mls twice daily for 30 seconds. Use after breakfast and at bedtime. Spit out excess. Do not swallow. (Patient taking differently: Use as directed 15 mLs in the mouth or throat 2 (two) times daily. Rinse with 15 mls twice daily for 30 seconds. Use after breakfast and at bedtime. Spit out excess. Do not swallow.) 480 mL prn  . HYDROcodone-acetaminophen (NORCO) 5-325 MG tablet Take 1 tablet by mouth every 8 (eight) hours as needed for moderate pain. 60 tablet 0  . levothyroxine (SYNTHROID) 175 MCG tablet Take 200 mcg by mouth every morning.     . lidocaine-prilocaine (EMLA) cream Apply to affected area once 30 g 3  . morphine (MS CONTIN) 15 MG 12 hr tablet Take 1 tablet (15 mg total) by mouth at bedtime. 60 tablet 0  . Nutritional Supplements (KATE FARMS PEPTIDE 1.5) LIQD Take 4 Bottles by mouth daily. 325 mL 6  . ondansetron (ZOFRAN) 8 MG tablet Take 1 tablet (8 mg total) by mouth 2 (two) times daily as needed (Nausea or vomiting). 30 tablet 1  . prochlorperazine (COMPAZINE) 10 MG tablet Take 1 tablet (10 mg total) by mouth every 6 (six) hours as needed (Nausea  or vomiting). 30 tablet 1  . sodium fluoride (PREVIDENT 5000 PLUS) 1.1 % CREA dental cream Apply cream to tooth brush. Brush teeth for 2 minutes. Spit out excess. DO NOT rinse afterwards. Repeat nightly. (Patient taking differently: Place 1 application onto teeth 2 (two) times a day. ) 1 Tube prn   No current facility-administered medications for this visit.   Facility-Administered Medications Ordered in Other Visits  Medication Dose Route Frequency Provider Last Rate Last Admin  . sodium chloride flush (NS) 0.9 % injection 10 mL  10 mL Intracatheter PRN Tish Men, MD   10 mL at 01/27/20 1255    REVIEW OF SYSTEMS: Her pain is well controlled.  She is dependent on feeding tube for nutritional needs and is doing well without recent weight loss Constitutional: Denies fevers, chills or abnormal night  sweats Eyes: Denies blurriness of vision, double vision or watery eyes Ears, nose, mouth, throat, and face: Denies mucositis or sore throat Respiratory: Denies cough, dyspnea or wheezes Cardiovascular: Denies palpitation, chest discomfort or lower extremity swelling Gastrointestinal:  Denies nausea, heartburn or change in bowel habits Skin: Denies abnormal skin rashes Lymphatics: Denies new lymphadenopathy or easy bruising Neurological:Denies numbness, tingling or new weaknesses Behavioral/Psych: Mood is stable, no new changes  All other systems were reviewed with the patient and are negative.  PHYSICAL EXAMINATION: ECOG PERFORMANCE STATUS: 1 - Symptomatic but completely ambulatory  Vitals:   01/27/20 1057  BP: 122/84  Pulse: 94  Resp: 18  Temp: 98.5 F (36.9 C)  SpO2: 100%   Filed Weights   01/27/20 1057  Weight: 118 lb 12.8 oz (53.9 kg)    GENERAL:alert, no distress and comfortable SKIN: skin color, texture, turgor are normal, no rashes or significant lesions EYES: normal, conjunctiva are pink and non-injected, sclera clear OROPHARYNX:no exudate, normal lips, buccal mucosa, and tongue  NECK: Tracheostomy site looks okay.  Neck is stiff and fibrosed from prior radiation LYMPH:  no palpable lymphadenopathy in the cervical, axillary or inguinal LUNGS: clear to auscultation and percussion with normal breathing effort HEART: regular rate & rhythm and no murmurs without lower extremity edema ABDOMEN:abdomen soft, non-tender and normal bowel sounds.  Feeding tube site looks okay Musculoskeletal:no cyanosis of digits and no clubbing.  Port site looks okay PSYCH: alert & oriented x 3 with fluent speech NEURO: no focal motor/sensory deficits  LABORATORY DATA:  I have reviewed the data as listed Lab Results  Component Value Date   WBC 3.6 (L) 01/27/2020   HGB 11.3 (L) 01/27/2020   HCT 36.3 01/27/2020   MCV 84.2 01/27/2020   PLT 227 01/27/2020   Recent Labs     12/16/19 1133 01/05/20 1020 01/27/20 1041  NA 138 139 141  K 4.4 4.4 4.0  CL 100 104 103  CO2 28 26 26   GLUCOSE 101* 103* 94  BUN 20 25* 27*  CREATININE 0.67 0.68 0.70  CALCIUM 9.1 8.7* 9.1  GFRNONAA >60 >60 >60  GFRAA >60 >60 >60  PROT 6.7 7.1 7.4  ALBUMIN 3.3* 3.4* 3.9  AST 16 14* 20  ALT 13 16 23   ALKPHOS 81 80 75  BILITOT 0.3 0.4 0.4    RADIOGRAPHIC STUDIES: I have personally reviewed the radiological images as listed and agreed with the findings in the report. IR PATIENT EVAL TECH 0-60 MINS  Result Date: 01/06/2020 Chipper O[place]     01/06/2020 11:59 AM Patient and her son came in today with concerns regarding the appearance of her g tube.  I evaluated the tube.  The distal 6-8  inches of the tube did appear worn and decompressed.  The proximal 6-8 inches appeared unaffected.  The patient states that it does not affect the function. I consulted with Dr Annamaria Boots.  I gave her options to cut the decompressed portion off or replace the entire tube with a replacement balloon or monarch dome tube.  Both of the replacement option are much shorter tube.  The patient requests keeping the current tube since the length helps her bolus feed. I advised her that if the proximal part of the tube starts to look decompressed or if the function of the tube becomes compromised then we should exchange it. Both the patient and her son were satisfied with leaving the current tube in place for now.  They will contact us with any further issue.   ASSESSMENT & PLAN:  Laryngeal squamous cell carcinoma (H[facility]) So far, she tolerated treatment well I recommend her to get minimum 4 cycles of treatment before repeat imaging She is in agreement  Pancytopenia, acquired ([place]) This is multifactorial, likely secondary to her prior treatment Due to potential risk of malnutrition, I will check iron studies and B12 in her next visit  Acquired hypothyroidism This is likely due to prior radiation exposure We will  continue to check her thyroid level and adjust medicine as needed  Goals of care, counseling/discussion She is aware that treatment goal is palliative  Cancer associated pain We discussed narcotic refill policy   Orders Placed This Encounter  Procedures  . CBC with Differential/Platelet    Standing Status:   Standing    Number of Occurrences:   22    Standing Expiration Date:   01/26/2021  . Comprehensive metabolic panel    Standing Status:   Standing    Number of Occurrences:   22    Standing Expiration Date:   01/26/2021  . TSH    Standing Status:   Standing    Number of Occurrences:   11    Standing Expiration Date:   01/26/2021  . T4, free    Standing Status:   Standing    Number of Occurrences:   11    Standing Expiration Date:   01/26/2021  . Iron and TIBC    Standing Status:   Future    Standing Expiration Date:   03/02/2021  . Vitamin B12    Standing Status:   Future    Standing Expiration Date:   03/02/2021  . Ferritin    Standing Status:   Future    Standing Expiration Date:   03/02/2021    All questions were answered. The patient knows to call the clinic with any problems, questions or concerns. The total time spent in the appointment was 30 minutes encounter with patients including review of chart and various tests results, discussions about plan of care and coordination of care plan   Heath Lark, MD 01/27/2020 2:56 PM

## 2020-01-27 NOTE — Patient Instructions (Signed)
[facility] Discharge Instructions for Patients Receiving Chemotherapy  Today you received the following chemotherapy agents: pembrolizumab.  To help prevent nausea and vomiting after your treatment, we encourage you to take your nausea medication as directed.   If you develop nausea and vomiting that is not controlled by your nausea medication, call the clinic.   BELOW ARE SYMPTOMS THAT SHOULD BE REPORTED IMMEDIATELY:  *FEVER GREATER THAN 100.5 F  *CHILLS WITH OR WITHOUT FEVER  NAUSEA AND VOMITING THAT IS NOT CONTROLLED WITH YOUR NAUSEA MEDICATION  *UNUSUAL SHORTNESS OF BREATH  *UNUSUAL BRUISING OR BLEEDING  TENDERNESS IN MOUTH AND THROAT WITH OR WITHOUT PRESENCE OF ULCERS  *URINARY PROBLEMS  *BOWEL PROBLEMS  UNUSUAL RASH Items with * indicate a potential emergency and should be followed up as soon as possible.  Feel free to call the clinic should you have any questions or concerns. The clinic phone number is [phone].  Please show the CHEMO ALERT CARD at check-in to the Emergency Department and triage nurse.   

## 2020-01-27 NOTE — Assessment & Plan Note (Signed)
We discussed narcotic refill policy

## 2020-01-27 NOTE — Telephone Encounter (Signed)
Scheduled appts per 5/6 sch msg. Left voicemail with appt dates and times.

## 2020-01-28 LAB — T4: T4, Total: 8.5 ug/dL (ref 4.5–12.0)

## 2020-01-31 DIAGNOSIS — R1314 Dysphagia, pharyngoesophageal phase: Secondary | ICD-10-CM | POA: Diagnosis not present

## 2020-02-02 DIAGNOSIS — C321 Malignant neoplasm of supraglottis: Secondary | ICD-10-CM | POA: Diagnosis not present

## 2020-02-02 DIAGNOSIS — R633 Feeding difficulties: Secondary | ICD-10-CM | POA: Diagnosis not present

## 2020-02-03 ENCOUNTER — Encounter: Payer: Self-pay | Admitting: Endocrinology

## 2020-02-03 DIAGNOSIS — Z930 Tracheostomy status: Secondary | ICD-10-CM | POA: Diagnosis not present

## 2020-02-03 DIAGNOSIS — E892 Postprocedural hypoparathyroidism: Secondary | ICD-10-CM | POA: Diagnosis not present

## 2020-02-03 DIAGNOSIS — E559 Vitamin D deficiency, unspecified: Secondary | ICD-10-CM | POA: Diagnosis not present

## 2020-02-03 DIAGNOSIS — C329 Malignant neoplasm of larynx, unspecified: Secondary | ICD-10-CM | POA: Diagnosis not present

## 2020-02-03 DIAGNOSIS — E039 Hypothyroidism, unspecified: Secondary | ICD-10-CM | POA: Diagnosis not present

## 2020-02-03 DIAGNOSIS — Z6822 Body mass index (BMI) 22.0-22.9, adult: Secondary | ICD-10-CM | POA: Diagnosis not present

## 2020-02-03 DIAGNOSIS — Z978 Presence of other specified devices: Secondary | ICD-10-CM | POA: Diagnosis not present

## 2020-02-07 ENCOUNTER — Ambulatory Visit: Payer: Medicare HMO | Admitting: Endocrinology

## 2020-02-09 ENCOUNTER — Ambulatory Visit: Payer: Medicare HMO | Admitting: Endocrinology

## 2020-02-09 ENCOUNTER — Encounter: Payer: Self-pay | Admitting: Endocrinology

## 2020-02-09 ENCOUNTER — Other Ambulatory Visit: Payer: Self-pay

## 2020-02-09 VITALS — BP 100/60 | HR 85 | Ht 62.0 in | Wt 123.0 lb

## 2020-02-09 DIAGNOSIS — E209 Hypoparathyroidism, unspecified: Secondary | ICD-10-CM

## 2020-02-09 DIAGNOSIS — E039 Hypothyroidism, unspecified: Secondary | ICD-10-CM | POA: Diagnosis not present

## 2020-02-09 NOTE — Patient Instructions (Addendum)
Please continue the same medications for now.   Please redo the blood tests in approx 1 month.  If they cannot do at the cancer center, please have drawn here.   If the vitamin-D is low, you should change to liquid.   Please come back for a follow-up appointment in 3-4 months.

## 2020-02-09 NOTE — Progress Notes (Signed)
Subjective:    Patient ID: [name] [name], female    DOB: April 18, 1951, 69 y.o.   MRN: [id number]  HPI hix is from partner [name].  Pt is referred by Dr Lisbeth Ply, for hypothyroidism.  Hypothyroidism was dx'ed 2006.  She has been on synthroid since then.  In 2020, she had laryngectomy with thyroidectomy, for cancer. she has never taken kelp or any other type of non-prescribed thyroid product.  she has never had thyroid surgery, or XRT to the neck.  she has never been on amiodarone or lithium.  Synthroid was reduced from 200 to 137 mcg/d, 5 days ago.   Also, low PTH was recently noted.  She takes Vit-D, 5000 units/day, since 10/21 (she crushes a capsule, then gives via feeding tube).  She reports fatigue and lightheadedness.  Past Medical History:  Diagnosis Date  . Cataract   . History of radiation therapy 02/11/19- 04/06/19   bilateral neck nodes and upper mediastinum  . Laryngeal cancer ([place]) 11/2018  . Thyroid disease     Past Surgical History:  Procedure Laterality Date  . CATARACT EXTRACTION, BILATERAL  left 05/20/16, right 06/03/16   Cataract Extraction w/ IOL Implant; Surgeon: Sudie Grumbling, MD; Location: [facility]; Service: Ophthalmology   . CESAREAN SECTION    . ESOPHAGOGASTRODUODENOSCOPY (EGD) WITH PROPOFOL N/A 02/01/2019   Procedure: ESOPHAGOGASTRODUODENOSCOPY (EGD) WITH PROPOFOL;  Surgeon: Milus Banister, MD;  Location: [facility];  Service: Endoscopy;  Laterality: N/A;  . EUS N/A 02/01/2019   Procedure: UPPER ENDOSCOPIC ULTRASOUND (EUS) RADIAL-Will need Linear and Forward Viewing EUS Scope;  Surgeon: Milus Banister, MD;  Location: [facility];  Service: Endoscopy;  Laterality: N/A;  . FINE NEEDLE ASPIRATION N/A 02/01/2019   Procedure: FINE NEEDLE ASPIRATION (FNA) LINEAR;  Surgeon: Milus Banister, MD;  Location: [facility];  Service: Endoscopy;  Laterality: N/A;  . IR GASTROSTOMY TUBE MOD SED  02/09/2019  . IR IMAGING GUIDED PORT INSERTION  02/02/2019  . IR PATIENT EVAL  TECH 0-60 MINS  01/06/2020  . KNEE CARTILAGE SURGERY Right 2019  . TRACHEOSTOMY  12/02/2018   [facility]    Social History   Socioeconomic History  . Marital status: Significant Other    Spouse name: [name]  . Number of children: 1  . Years of education: Not on file  . Highest education level: Not on file  Occupational History  . Not on file  Tobacco Use  . Smoking status: Former Smoker    Types: Cigarettes    Quit date: 12/02/2018    Years since quitting: 1.1  . Smokeless tobacco: Never Used  Substance and Sexual Activity  . Alcohol use: Not Currently  . Drug use: Never  . Sexual activity: Not on file  Other Topics Concern  . Not on file  Social History Narrative  . Not on file   Social Determinants of Health   Financial Resource Strain:   . Difficulty of Paying Living Expenses:   Food Insecurity:   . Worried About Charity fundraiser in the Last Year:   . Arboriculturist in the Last Year:   Transportation Needs:   . Film/video editor (Medical):   Marland Kitchen Lack of Transportation (Non-Medical):   Physical Activity:   . Days of Exercise per Week:   . Minutes of Exercise per Session:   Stress:   . Feeling of Stress :   Social Connections:   . Frequency of Communication with Friends and Family:   .  Frequency of Social Gatherings with Friends and Family:   . Attends Religious Services:   . Active Member of Clubs or Organizations:   . Attends Archivist Meetings:   Marland Kitchen Marital Status:   Intimate Partner Violence:   . Fear of Current or Ex-Partner:   . Emotionally Abused:   Marland Kitchen Physically Abused:   . Sexually Abused:     Current Outpatient Medications on File Prior to Visit  Medication Sig Dispense Refill  . chlorhexidine (PERIDEX) 0.12 % solution Rinse with 15 mls twice daily for 30 seconds. Use after breakfast and at bedtime. Spit out excess. Do not swallow. (Patient taking differently: Use as directed 15 mLs in the mouth or throat 2 (two) times  daily. Rinse with 15 mls twice daily for 30 seconds. Use after breakfast and at bedtime. Spit out excess. Do not swallow.) 480 mL prn  . HYDROcodone-acetaminophen (NORCO) 5-325 MG tablet Take 1 tablet by mouth every 8 (eight) hours as needed for moderate pain. 60 tablet 0  . levothyroxine (SYNTHROID) 137 MCG tablet Take 137 mcg by mouth daily before breakfast.    . lidocaine-prilocaine (EMLA) cream Apply to affected area once 30 g 3  . morphine (MSIR) 15 MG tablet Take 15 mg by mouth at bedtime.    . Nutritional Supplements (KATE FARMS PEPTIDE 1.5) LIQD Take 4 Bottles by mouth daily. 325 mL 6  . ondansetron (ZOFRAN) 8 MG tablet Take 1 tablet (8 mg total) by mouth 2 (two) times daily as needed (Nausea or vomiting). 30 tablet 1  . prochlorperazine (COMPAZINE) 10 MG tablet Take 1 tablet (10 mg total) by mouth every 6 (six) hours as needed (Nausea or vomiting). 30 tablet 1  . sodium fluoride (PREVIDENT 5000 PLUS) 1.1 % CREA dental cream Apply cream to tooth brush. Brush teeth for 2 minutes. Spit out excess. DO NOT rinse afterwards. Repeat nightly. (Patient taking differently: Place 1 application onto teeth 2 (two) times a day. ) 1 Tube prn   No current facility-administered medications on file prior to visit.    No Known Allergies  Family History  Problem Relation Age of Onset  . Cancer Mother   . Heart attack Father     BP 100/60   Pulse 85   Ht 5\' 2"  (1.575 m)   Wt 123 lb (55.8 kg)   SpO2 99%   BMI 22.50 kg/m   Review of Systems denies depression, muscle cramps, memory loss, constipation, numbness, and cold intolerance.  She has dry skin.  She has lost 100 lbs x 1 year.       Objective:   Physical Exam VS: see vs page GEN: no distress HEAD: head: no deformity eyes: no periorbital swelling, no proptosis external nose and ears are normal NECK: tracheostomy.   CHEST WALL: no deformity LUNGS: clear to auscultation CV: reg rate and rhythm, no murmur.  MUSCULOSKELETAL: muscle bulk  and strength are grossly normal.  no obvious joint swelling.  gait is normal and steady EXTEMITIES: no deformity.  no leg edema NEURO:  cn 2-12 grossly intact.   readily moves all 4's.  sensation is intact to touch on all 4's.  No tremor SKIN:  Normal texture and temperature.  No rash or suspicious lesion is visible.  Not diaphoretic.   PSYCH: alert, well-oriented.  Does not appear anxious nor depressed.    outside test results are reviewed: (09/21/19) PTH=10 TSH=0.21  (02/03/20): TSH=0.37 25-OH Vit-D=50 Ca++=9.4  I have reviewed outside records, and summarized:  Pt was noted to have elevated a1c, and referred here. synthroid was reduced, due to low TSH.  Weight loss was rx'ed with supplement.       Assessment & Plan:  Chronic primary hypothyroidism: s/p recent med adjustment.  Postsurgical hypoparathyroidism, new to me.   Vit-D def: well-replaced.   Patient Instructions  Please continue the same medications for now.   Please redo the blood tests in approx 1 month.  If they cannot do at the cancer center, please have drawn here.   If the vitamin-D is low, you should change to liquid.   Please come back for a follow-up appointment in 3-4 months.

## 2020-02-17 ENCOUNTER — Inpatient Hospital Stay: Payer: Medicare HMO | Admitting: Hematology and Oncology

## 2020-02-17 ENCOUNTER — Inpatient Hospital Stay: Payer: Medicare HMO | Admitting: Nutrition

## 2020-02-17 ENCOUNTER — Inpatient Hospital Stay: Payer: Medicare HMO

## 2020-02-17 ENCOUNTER — Encounter: Payer: Self-pay | Admitting: Hematology and Oncology

## 2020-02-17 ENCOUNTER — Other Ambulatory Visit: Payer: Self-pay

## 2020-02-17 VITALS — BP 105/66 | HR 74 | Temp 98.2°F | Resp 17 | Ht 62.0 in | Wt 125.9 lb

## 2020-02-17 DIAGNOSIS — C321 Malignant neoplasm of supraglottis: Secondary | ICD-10-CM | POA: Diagnosis not present

## 2020-02-17 DIAGNOSIS — D61818 Other pancytopenia: Secondary | ICD-10-CM | POA: Diagnosis not present

## 2020-02-17 DIAGNOSIS — Z95828 Presence of other vascular implants and grafts: Secondary | ICD-10-CM

## 2020-02-17 DIAGNOSIS — E039 Hypothyroidism, unspecified: Secondary | ICD-10-CM

## 2020-02-17 DIAGNOSIS — D539 Nutritional anemia, unspecified: Secondary | ICD-10-CM

## 2020-02-17 DIAGNOSIS — Z5112 Encounter for antineoplastic immunotherapy: Secondary | ICD-10-CM | POA: Diagnosis not present

## 2020-02-17 DIAGNOSIS — C329 Malignant neoplasm of larynx, unspecified: Secondary | ICD-10-CM | POA: Diagnosis not present

## 2020-02-17 DIAGNOSIS — G893 Neoplasm related pain (acute) (chronic): Secondary | ICD-10-CM

## 2020-02-17 LAB — CBC WITH DIFFERENTIAL/PLATELET
Abs Immature Granulocytes: 0.02 10*3/uL (ref 0.00–0.07)
Basophils Absolute: 0 10*3/uL (ref 0.0–0.1)
Basophils Relative: 0 %
Eosinophils Absolute: 0.2 10*3/uL (ref 0.0–0.5)
Eosinophils Relative: 2 %
HCT: 36.7 % (ref 36.0–46.0)
Hemoglobin: 11.6 g/dL — ABNORMAL LOW (ref 12.0–15.0)
Immature Granulocytes: 0 %
Lymphocytes Relative: 13 %
Lymphs Abs: 1 10*3/uL (ref 0.7–4.0)
MCH: 27 pg (ref 26.0–34.0)
MCHC: 31.6 g/dL (ref 30.0–36.0)
MCV: 85.3 fL (ref 80.0–100.0)
Monocytes Absolute: 0.7 10*3/uL (ref 0.1–1.0)
Monocytes Relative: 8 %
Neutro Abs: 5.9 10*3/uL (ref 1.7–7.7)
Neutrophils Relative %: 77 %
Platelets: 226 10*3/uL (ref 150–400)
RBC: 4.3 MIL/uL (ref 3.87–5.11)
RDW: 17.4 % — ABNORMAL HIGH (ref 11.5–15.5)
WBC: 7.8 10*3/uL (ref 4.0–10.5)
nRBC: 0 % (ref 0.0–0.2)

## 2020-02-17 LAB — CMP (CANCER CENTER ONLY)
ALT: 24 U/L (ref 0–44)
AST: 22 U/L (ref 15–41)
Albumin: 3.9 g/dL (ref 3.5–5.0)
Alkaline Phosphatase: 75 U/L (ref 38–126)
Anion gap: 10 (ref 5–15)
BUN: 26 mg/dL — ABNORMAL HIGH (ref 8–23)
CO2: 28 mmol/L (ref 22–32)
Calcium: 8.7 mg/dL — ABNORMAL LOW (ref 8.9–10.3)
Chloride: 99 mmol/L (ref 98–111)
Creatinine: 0.6 mg/dL (ref 0.44–1.00)
GFR, Est AFR Am: >60 mL/min (ref >60)
GFR, Estimated: >60 mL/min (ref >60)
Glucose, Bld: 107 mg/dL — ABNORMAL HIGH (ref 70–99)
Potassium: 4.2 mmol/L (ref 3.5–5.1)
Sodium: 137 mmol/L (ref 135–145)
Total Bilirubin: 0.5 mg/dL (ref 0.3–1.2)
Total Protein: 7.2 g/dL (ref 6.5–8.1)

## 2020-02-17 LAB — T4, FREE: Free T4: 0.97 ng/dL (ref 0.61–1.12)

## 2020-02-17 LAB — VITAMIN B12: Vitamin B-12: 465 pg/mL (ref 180–914)

## 2020-02-17 MED ORDER — SODIUM CHLORIDE 0.9 % IV SOLN
Freq: Once | INTRAVENOUS | Status: AC
  Filled 2020-02-17: qty 250

## 2020-02-17 MED ORDER — SODIUM CHLORIDE 0.9 % IV SOLN
200.0000 mg | Freq: Once | INTRAVENOUS | Status: AC
  Administered 2020-02-17: 200 mg via INTRAVENOUS
  Filled 2020-02-17: qty 8

## 2020-02-17 MED ORDER — CHLORHEXIDINE GLUCONATE 0.12 % MT SOLN: OROMUCOSAL | 3 refills | Status: DC

## 2020-02-17 MED ORDER — HYDROCODONE-ACETAMINOPHEN 5-325 MG PO TABS: 1.0000 | ORAL_TABLET | Freq: Three times a day (TID) | ORAL | 0 refills | Status: DC | PRN

## 2020-02-17 MED ORDER — HEPARIN SOD (PORK) LOCK FLUSH 100 UNIT/ML IV SOLN
500.0000 [IU] | Freq: Once | INTRAVENOUS | Status: AC | PRN
  Administered 2020-02-17: 500 [IU]
  Filled 2020-02-17: qty 5

## 2020-02-17 MED ORDER — SODIUM CHLORIDE 0.9% FLUSH
10.0000 mL | INTRAVENOUS | Status: DC | PRN
  Administered 2020-02-17: 10 mL via INTRAVENOUS
  Filled 2020-02-17: qty 10

## 2020-02-17 MED ORDER — SODIUM CHLORIDE 0.9% FLUSH
10.0000 mL | INTRAVENOUS | Status: DC | PRN
  Administered 2020-02-17: 10 mL
  Filled 2020-02-17: qty 10

## 2020-02-17 NOTE — Assessment & Plan Note (Signed)
This is likely due to prior radiation exposure We will continue to check her thyroid level and adjust medicine as needed

## 2020-02-17 NOTE — Assessment & Plan Note (Signed)
This is multifactorial, likely secondary to her prior treatment Her serum vitamin B12 is adequate

## 2020-02-17 NOTE — Assessment & Plan Note (Signed)
So far, she tolerated treatment well I plan to repeat PET CT scan before her next visit She is in agreement

## 2020-02-17 NOTE — Patient Instructions (Signed)
[facility] Discharge Instructions for Patients Receiving Chemotherapy  Today you received the following chemotherapy agents:  Keytruda.  To help prevent nausea and vomiting after your treatment, we encourage you to take your nausea medication as directed.   If you develop nausea and vomiting that is not controlled by your nausea medication, call the clinic.   BELOW ARE SYMPTOMS THAT SHOULD BE REPORTED IMMEDIATELY:  *FEVER GREATER THAN 100.5 F  *CHILLS WITH OR WITHOUT FEVER  NAUSEA AND VOMITING THAT IS NOT CONTROLLED WITH YOUR NAUSEA MEDICATION  *UNUSUAL SHORTNESS OF BREATH  *UNUSUAL BRUISING OR BLEEDING  TENDERNESS IN MOUTH AND THROAT WITH OR WITHOUT PRESENCE OF ULCERS  *URINARY PROBLEMS  *BOWEL PROBLEMS  UNUSUAL RASH Items with * indicate a potential emergency and should be followed up as soon as possible.  Feel free to call the clinic should you have any questions or concerns. The clinic phone number is [phone].  Please show the CHEMO ALERT CARD at check-in to the Emergency Department and triage nurse.    

## 2020-02-17 NOTE — Progress Notes (Signed)
Nutrition Follow-up:  Met with patient during infusion for recurrent laryngeal cancer. Patient reports that she is infusing 4 bottles of [employer] 1.5 daily with 60 ml flushes before and after feedings. Patient reports 3 Boost daily, requesting ordering information for [employer]. [employer] supplements are not available in stores, provided patient with web site address and contact number. Patient denies nausea, vomiting, diarrhea. Reports that her son has been getting her out of the house more, reports increased energy and activity.   Medications: Zofran, Compazine  Labs: BG 107, BUN 26, Ca 8.7  Anthropometrics:   Weight today 125 lb 14.4 oz, increased from 123 lb on 5/19, increased from 117 lb 4.8 oz on 4/14   NUTRITION DIAGNOSIS: Unintended weight loss improving   INTERVENTION:  Patient will continue infusing [employer] peptide 1.5 via feeding tube Encouraged continuing Boost supplements, coupons provided [employer] samples given and contact information for ordering supplements on-line    MONITORING, EVALUATION, GOAL: Patient will continue to tolerate increased calories and protein to minimize weight loss   NEXT VISIT: June 17 during infusion  Lajuan Lines, RD, LDN Clinical Nutrition After Hours/Weekend Pager # in [place]

## 2020-02-17 NOTE — Progress Notes (Signed)
[facility] OFFICE PROGRESS NOTE  Patient Care Team: Hamrick, Lorin Mercy, MD as PCP - General (Family Medicine) Eppie Gibson, MD as Attending Physician (Radiation Oncology) Tish Men, MD as Consulting Physician (Hematology) Leota Sauers, RN (Inactive) as Oncology Nurse Navigator Karie Mainland, RD as Dietitian (Nutrition) Sharen Counter, CCC-SLP as Speech Language Pathologist (Speech Pathology) Malmfelt, Stephani Police, RN as Oncology Nurse Navigator (Oncology)  ASSESSMENT & PLAN:  Laryngeal squamous cell carcinoma [facility]) So far, she tolerated treatment well I plan to repeat PET CT scan before her next visit She is in agreement  Cancer associated pain Her pain is well controlled I refill her prescription pain medicine today We discussed narcotic refill policy  Hypothyroidism This is likely due to prior radiation exposure We will continue to check her thyroid level and adjust medicine as needed  Pancytopenia, acquired [facility]) This is multifactorial, likely secondary to her prior treatment Her serum vitamin B12 is adequate   Orders Placed This Encounter  Procedures  . NM PET Image Restag (PS) Skull Base To Thigh    Standing Status:   Future    Standing Expiration Date:   02/16/2021    Order Specific Question:   If indicated for the ordered procedure, I authorize the administration of a radiopharmaceutical per Radiology protocol    Answer:   Yes    Order Specific Question:   Preferred imaging location?    Answer:   [facility]    Order Specific Question:   Radiology Contrast Protocol - do NOT remove file path    Answer:   [email or web address]    All questions were answered. The patient knows to call the clinic with any problems, questions or concerns. The total time spent in the appointment was 20 minutes encounter with patients including review of chart and various tests results, discussions about plan of care and coordination of  care plan   Heath Lark, MD 02/17/2020 4:25 PM  INTERVAL HISTORY: Please see below for problem oriented charting. She returns for further follow-up We communicate through gesture and written language She is doing well Her pain is well controlled She denies nausea or constipation She is using 6 to 7 cans of nutritional supplement and is gaining healthy weight  SUMMARY OF ONCOLOGIC HISTORY: Oncology History  Laryngeal squamous cell carcinoma ([place])  12/02/2018 Imaging   CT neck: IMPRESSION: 1. Supraglottic laryngeal carcinoma extending across the anterior commissure and invading the prelaryngeal fat, left aryepiglottic fold and left aspect of the epiglottis. 2. Severe narrowing of the laryngeal airway. 3. Bilateral subcentimeter level 2A and 2A cervical lymph nodes.     12/02/2018 Imaging   CT  Chest:  IMPRESSION: No evidence of metastatic disease in the chest.   12/03/2018 Pathology Results   (Care Everywhere; [facility]) A: Larynx, supraglottis, biopsy - Atypical squamous proliferation with at least low grade dysplasia and abundant keratinization - No definite high grade dysplasia or invasive carcinoma represented (see comment)   12/18/2018 Pathology Results   (Care Everywhere; [facility] [place]) A:  Neck, left, fine needle aspiration - Rare single atypical cells, no cells diagnostic of malignancy - Lymph node material present  B:  Neck, right, fine needle aspiration - Non-diagnostic specimen - No lymph node material identified - Mixed inflammation and blood   12/31/2018 Pathology Results   (Care Everywhere; [facility])  A: Larynx, left supraglottic mass, biopsy - Atypical squamous proliferation with abundant keratinization, suspicious for well-differentiated invasive squamous cell carcinoma  B: Larynx, left  supraglottic mass, biopsy - Well-differentiated invasive squamous cell carcinoma with abundant keratinization (see comment)   01/18/2019 Initial Diagnosis   Laryngeal  cancer ([place])   01/26/2019 Imaging   1. Locally advanced hypermetabolic laryngeal neoplasm centered in the left supraglottic space with bilateral glottic and epiglottic involvement and subglottic extension on the left. 2. Hypermetabolic bilateral level 3 neck nodal metastases. 3. Hypermetabolic high mediastinal nodal metastases between the trachea and upper thoracic esophagus. 4. Otherwise no distant hypermetabolic metastatic disease. 5. Nodular 7 mm focus at the left lung base, below PET resolution, recommend attention on follow-up chest CT in 3 months. 6. Chronic findings include: Aortic Atherosclerosis (ICD10-I70.0). Cholelithiasis.   02/08/2019 Cancer Staging   Staging form: Larynx - Supraglottis, AJCC 8th Edition - Clinical stage from 02/08/2019: Stage IVA (cT3, cN2c, cM0) - Signed by Eppie Gibson, MD on 02/08/2019   02/11/2019 - 04/07/2019 Chemotherapy   The patient weekly cisplatin for chemotherapy treatment.     07/20/2019 Pathology Results   PROCEDURE: Total glossectomy, total pharyngectomy, total laryngectomy, total thyroidectomy, and cervical esophagectomy TUMOR SITE: Larynx TUMOR LATERALITY: Not specified TUMOR FOCALITY: Unifocal TUMOR SIZE:    GREATEST DIMENSION: 6.9 cm    ADDITIONAL DIMENSIONS: 5.4 x 2.4 cm HISTOLOGIC TYPE: Squamous cell carcinoma, conventional (keratinizing) HISTOLOGIC GRADE:  G1-G2: Well to moderately differentiated TUMOR EXTENSION: Tumor involves the posterior tongue, pharynx, larynx, tracheal cartilage, hyoid bone, cervical esophagus, vagus nerve, carotid artery, deep cervical fascia, ans surrounding skeletal muscle and soft tissue MARGINS: Uninvolved by invasive tumor; Perineural invasion is less than 22mm from the left soft tissue margin LYMPHOVASCULAR INVASION: Not identified PERINEURAL INVASION:  Present REGIONAL LYMPH NODES:     NUMBER OF LYMPH NODES INVOLVED: 2     NUMBER OF LYMPH NODES EXAMINED: 24     LATERALITY OF LYMPH NODES INVOLVED:  Cannot determine     SIZE OF LARGEST METASTATIC DEPOSIT: 0.8 cm     EXTRANODAL EXTENSION: Not identified PATHOLOGIC STAGE CLASSIFICATION (pTNM, AJCC 8TH Ed): (r)(y) pT4b pN2a DISTANT METASTASIS (pM): ADDITIONAL PATHOLOGIC FINDINGS: Previous chemoradiation   11/04/2019 Imaging   PET: 1.  Hypermetabolic bilateral neck lesions as detailed above are concerning for disease recurrence. 2.  Compared to 07/08/2019 PET scan, interval development of subcentimeter bilateral upper lobe pulmonary nodules, size below PET resolution. These are concerning for metastasis. Consider diagnostic quality CT chest exam for further evaluation as per oncology treatment planning. 3.  Focal hypermetabolic uptake in right ventricular cavity of indeterminate significance. Recommend echocardiographic examination to rule out underlying thrombus. This finding was discussed with Dr. Conley Canal by Dr. Clementeen Graham and Dr. Marcello Moores on 11/04/2019 at approximately 3:00 PM via telephone. 4.  Ancillary CT findings as above.   11/05/2019 Imaging   Echocardiogram: Summary   1. The left ventricle is normal in size with normal wall thickness.   2. The left ventricular systolic function is normal, LVEF is visually estimated at 60-65%.   3. The right ventricle is normal in size, with normal systolic function.   4. There is an extrinsic compression of the RV and RA laterally at the AV groove by a mass of 4.3 cm diameter. Unclear if the mass is invasive, although no pericardial effusion seen suggestive no invasion possibly. There is no evidence of hemodynamic compromise secondary to cardiac compression with normal IVC diameter.   12/06/2019 Pathology Results   A. LYMPH NODE, LEFT SUBMANDIBULAR, BIOPSY:  - Squamous cell carcinoma.  - No distinct nodal tissue identified.    12/06/2019 Procedure   Successful ultrasound-guided  core biopsy of centrally necrotic left submandibular lymph node. Of note, due to extensive prior postsurgical and post  radiation changes, patient has very limited mobility in the neck and the biopsy was extremely challenging    12/16/2019 -  Chemotherapy   The patient had pembrolizumab for chemotherapy treatment.       REVIEW OF SYSTEMS:   Constitutional: Denies fevers, chills or abnormal weight loss Eyes: Denies blurriness of vision Ears, nose, mouth, throat, and face: Denies mucositis or sore throat Respiratory: Denies cough, dyspnea or wheezes Cardiovascular: Denies palpitation, chest discomfort or lower extremity swelling Gastrointestinal:  Denies nausea, heartburn or change in bowel habits Skin: Denies abnormal skin rashes Lymphatics: Denies new lymphadenopathy or easy bruising Neurological:Denies numbness, tingling or new weaknesses Behavioral/Psych: Mood is stable, no new changes  All other systems were reviewed with the patient and are negative.  I have reviewed the past medical history, past surgical history, social history and family history with the patient and they are unchanged from previous note.  ALLERGIES:  has No Known Allergies.  MEDICATIONS:  Current Outpatient Medications  Medication Sig Dispense Refill  . chlorhexidine (PERIDEX) 0.12 % solution Rinse with 15 mls twice daily for 30 seconds. Use after breakfast and at bedtime. Spit out excess. Do not swallow. 480 mL 3  . HYDROcodone-acetaminophen (NORCO) 5-325 MG tablet Take 1 tablet by mouth every 8 (eight) hours as needed for moderate pain. 60 tablet 0  . levothyroxine (SYNTHROID) 137 MCG tablet Take 137 mcg by mouth daily before breakfast.    . lidocaine-prilocaine (EMLA) cream Apply to affected area once 30 g 3  . morphine (MSIR) 15 MG tablet Take 15 mg by mouth at bedtime.    . Nutritional Supplements (KATE FARMS PEPTIDE 1.5) LIQD Take 4 Bottles by mouth daily. 325 mL 6  . ondansetron (ZOFRAN) 8 MG tablet Take 1 tablet (8 mg total) by mouth 2 (two) times daily as needed (Nausea or vomiting). 30 tablet 1  . prochlorperazine  (COMPAZINE) 10 MG tablet Take 1 tablet (10 mg total) by mouth every 6 (six) hours as needed (Nausea or vomiting). 30 tablet 1  . sodium fluoride (PREVIDENT 5000 PLUS) 1.1 % CREA dental cream Apply cream to tooth brush. Brush teeth for 2 minutes. Spit out excess. DO NOT rinse afterwards. Repeat nightly. (Patient taking differently: Place 1 application onto teeth 2 (two) times a day. ) 1 Tube prn   No current facility-administered medications for this visit.   Facility-Administered Medications Ordered in Other Visits  Medication Dose Route Frequency Provider Last Rate Last Admin  . heparin lock flush 100 unit/mL  500 Units Intracatheter Once PRN Tish Men, MD      . pembrolizumab [facility]) 200 mg in sodium chloride 0.9 % 50 mL chemo infusion  200 mg Intravenous Once Tish Men, MD 116 mL/hr at 02/17/20 1611 200 mg at 02/17/20 1611  . sodium chloride flush (NS) 0.9 % injection 10 mL  10 mL Intracatheter PRN Tish Men, MD        PHYSICAL EXAMINATION: ECOG PERFORMANCE STATUS: 1 - Symptomatic but completely ambulatory  Vitals:   02/17/20 1142  BP: 105/66  Pulse: 74  Resp: 17  Temp: 98.2 F (36.8 C)  SpO2: 99%   Filed Weights   02/17/20 1142  Weight: 125 lb 14.4 oz (57.1 kg)    GENERAL:alert, no distress and comfortable SKIN: skin color, texture, turgor are normal, no rashes or significant lesions EYES: normal, Conjunctiva are pink and non-injected, sclera clear  OROPHARYNX:no exudate, no erythema and lips, buccal mucosa, and tongue normal  NECK: Tracheostomy in situ LYMPH:  no palpable lymphadenopathy in the cervical, axillary or inguinal LUNGS: clear to auscultation and percussion with normal breathing effort HEART: regular rate & rhythm and no murmurs and no lower extremity edema ABDOMEN:abdomen soft, non-tender and normal bowel sounds Musculoskeletal:no cyanosis of digits and no clubbing  NEURO: alert & oriented x 3, no focal motor/sensory deficits  LABORATORY DATA:  I have  reviewed the data as listed    Component Value Date/Time   NA 137 02/17/2020 1128   K 4.2 02/17/2020 1128   CL 99 02/17/2020 1128   CO2 28 02/17/2020 1128   GLUCOSE 107 (H) 02/17/2020 1128   BUN 26 (H) 02/17/2020 1128   CREATININE 0.60 02/17/2020 1128   CALCIUM 8.7 (L) 02/17/2020 1128   PROT 7.2 02/17/2020 1128   ALBUMIN 3.9 02/17/2020 1128   AST 22 02/17/2020 1128   ALT 24 02/17/2020 1128   ALKPHOS 75 02/17/2020 1128   BILITOT 0.5 02/17/2020 1128   GFRNONAA >60 02/17/2020 1128   GFRAA >60 02/17/2020 1128    No results found for: SPEP, UPEP  Lab Results  Component Value Date   WBC 7.8 02/17/2020   NEUTROABS 5.9 02/17/2020   HGB 11.6 (L) 02/17/2020   HCT 36.7 02/17/2020   MCV 85.3 02/17/2020   PLT 226 02/17/2020      Chemistry      Component Value Date/Time   NA 137 02/17/2020 1128   K 4.2 02/17/2020 1128   CL 99 02/17/2020 1128   CO2 28 02/17/2020 1128   BUN 26 (H) 02/17/2020 1128   CREATININE 0.60 02/17/2020 1128      Component Value Date/Time   CALCIUM 8.7 (L) 02/17/2020 1128   ALKPHOS 75 02/17/2020 1128   AST 22 02/17/2020 1128   ALT 24 02/17/2020 1128   BILITOT 0.5 02/17/2020 1128

## 2020-02-17 NOTE — Assessment & Plan Note (Signed)
Her pain is well controlled I refill her prescription pain medicine today We discussed narcotic refill policy

## 2020-02-18 LAB — FERRITIN: Ferritin: 356 ng/mL — ABNORMAL HIGH (ref 11–307)

## 2020-02-18 LAB — IRON AND TIBC
Iron: 22 ug/dL — ABNORMAL LOW (ref 28–170)
Saturation Ratios: 6 % — ABNORMAL LOW (ref 10.4–31.8)
TIBC: 361 ug/dL (ref 250–450)
UIBC: 339 ug/dL

## 2020-02-18 LAB — TSH: TSH: 1.115 u[IU]/mL (ref 0.350–4.500)

## 2020-02-24 DIAGNOSIS — Z1331 Encounter for screening for depression: Secondary | ICD-10-CM | POA: Diagnosis not present

## 2020-02-24 DIAGNOSIS — E785 Hyperlipidemia, unspecified: Secondary | ICD-10-CM | POA: Diagnosis not present

## 2020-02-24 DIAGNOSIS — Z0000 Encounter for general adult medical examination without abnormal findings: Secondary | ICD-10-CM | POA: Diagnosis not present

## 2020-02-24 DIAGNOSIS — Z9181 History of falling: Secondary | ICD-10-CM | POA: Diagnosis not present

## 2020-03-08 ENCOUNTER — Other Ambulatory Visit: Payer: Self-pay

## 2020-03-08 ENCOUNTER — Ambulatory Visit (HOSPITAL_COMMUNITY)
Admission: RE | Admit: 2020-03-08 | Discharge: 2020-03-08 | Disposition: A | Discharge status: Home / Self Care | Payer: Medicare HMO | Source: Ambulatory Visit | Attending: Hematology and Oncology | Admitting: Hematology and Oncology

## 2020-03-08 DIAGNOSIS — C321 Malignant neoplasm of supraglottis: Secondary | ICD-10-CM

## 2020-03-08 DIAGNOSIS — C329 Malignant neoplasm of larynx, unspecified: Secondary | ICD-10-CM | POA: Insufficient documentation

## 2020-03-08 DIAGNOSIS — C760 Malignant neoplasm of head, face and neck: Secondary | ICD-10-CM | POA: Diagnosis not present

## 2020-03-08 IMAGING — PT NM PET TUM IMG RESTAG (PS) SKULL BASE T - THIGH
1 of 8 series · 2 of 25 positions shown · non-contrast
Comparison: [DATE]

CLINICAL DATA: Subsequent treatment strategy for head neck cancer,
diagnosed in [DATE] with LEFT supraglottic mass post total
glossectomy pharyngectomy and laryngectomy with 2 of 24 nodes
positive., found to have recurrence on [DATE] PET exam
which is available for review, undergoing immunotherapy as of
[DATE].

EXAM:
NUCLEAR MEDICINE PET SKULL BASE TO THIGH
TECHNIQUE: 6.37 mCi F-18 FDG was injected intravenously. Full-ring PET imaging
was performed from the skull base to thigh after the radiotracer. CT
data was obtained and used for attenuation correction and anatomic
localization.
Fasting blood glucose: 88 mg/dl

[Series 4: ct hn_sk_th 5.0 b31f · axial · 5.0mm · 0.98mm/px · z∈[-825,-17]mm · 2 of 203 slices shown]
[im 1/203  brain]
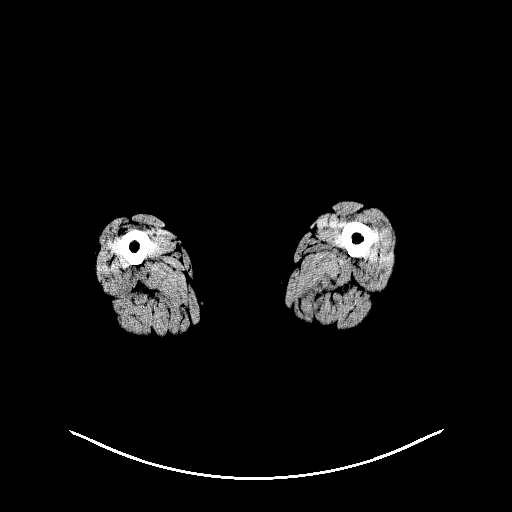
[im 203/203  brain]
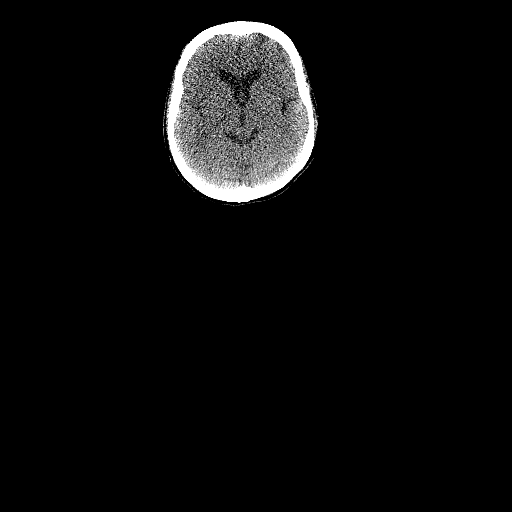

[2 of 25 positions shown; findings below may reference images not displayed]

FINDINGS: Mediastinal blood pool activity: SUV max

Liver activity: SUV max NA

NECK: Postoperative changes in the neck as before with signs of
glossectomy and laryngectomy.

Marked interval decrease in size of the mass at the angle of the
LEFT mandible (image 29 of series 4) this measures approximately
by 1.4 cm and abuts the angle of the LEFT mandible previously
measuring 3.5 x 3.7 cm (SUVmax = 12.0) previously 14.3 small amount
of gas seen in the central portion at the site of prior necrosis
based on the FDG uptake in the periphery on the prior study.

Soft tissue in the region of the deep parotid on the RIGHT also
along the angle of the mandible and adjacent to the carotid artery,
slightly diminished in size compared to the prior study
approximately 14 x 9 mm (image 26, series 4), previously
approximately 18 x 16 mm (SUVmax = 4.3) previously 5.7 no new sites
of FDG uptake in the neck.

Incidental CT findings: Postoperative changes as above.

CHEST: Choose 1 small RIGHT upper lobe pulmonary nodules with new
nodule anteriorly on image 13 of series 8 measuring 4.5 mm. Other
small nodules in the RIGHT upper lobe are unchanged.

Small LEFT upper lobe pulmonary nodule (image 11, series 60)
approximately 6 mm unchanged and without FDG uptake above background
activity.

Juxta cardiac mass measures approximately 5.4 x 4.1 cm (image 82,
series 4) peripheral increased FDG uptake on today's study. Note
that this could be confounded by surrounding cardiac activity
(SUVmax = 8.2) previously

Incidental CT findings: Calcified atheromatous plaque of the
thoracic aorta. No pericardial effusion despite large mass along the
margin of the heart. Areas not well-defined given lack of
intravenous contrast. RIGHT-sided Port-A-Cath terminates at the
caval to atrial junction. Basilar atelectasis in addition to
pulmonary nodules discussed above.

ABDOMEN/PELVIS: No abnormal hypermetabolic activity within the
liver, pancreas, adrenal glands, or spleen. No hypermetabolic lymph
nodes in the abdomen or pelvis.

Incidental CT findings: Cholelithiasis and G-tube.

SKELETON: Focal increased metabolic activity at LEFT
ischium/hamstring insertion mild increased uptake seen on the RIGHT
but not as pronounced. Mild duct takes scattered throughout
degenerative areas of the spine and in the bilateral shoulders no
focal area of metabolic activity to suggest metastasis.

Incidental CT findings: Spinal degenerative changes as above
IMPRESSION: 1. Diminished size of masses in the neck as described. Persistent
increased metabolic activity particularly in the LEFT neck. Some of
this diminished size on the LEFT could be due to decompression of
necrotic material. Correlate with any symptoms of inflammation or
infection in this area as well given the paucity of residual soft
tissue associated with the above abnormality, overall findings are
compatible with residual disease in this location and there is no
significant surrounding stranding to indicate inflammation that
would correlate with the degree of FDG uptake remaining.
2. Diminished activity in the juxta cardiac mass with peripheral
activity suggesting interval necrosis, on the prior study this was
uniformly hypermetabolic. There is still considerable FDG uptake.
3. No signs of disease in the abdomen or pelvis.
4. Presumed hamstring tendinopathy on the LEFT. Correlate with any
new pain or symptoms in this area with further imaging as warranted.
No discrete mass or bone destruction on today's study.

These results will be called to the ordering clinician or
representative by the Radiologist Assistant, and communication
documented in the PACS or [REDACTED].

## 2020-03-08 MED ORDER — FLUDEOXYGLUCOSE F - 18 (FDG) INJECTION: 6037.0000 | Freq: Once | INTRAVENOUS | Status: DC | PRN

## 2020-03-08 MED ORDER — FLUDEOXYGLUCOSE F - 18 (FDG) INJECTION
6.3700 | Freq: Once | INTRAVENOUS | Status: AC | PRN
  Administered 2020-03-08: 6.37 via INTRAVENOUS

## 2020-03-09 ENCOUNTER — Other Ambulatory Visit: Payer: Self-pay

## 2020-03-09 ENCOUNTER — Ambulatory Visit: Payer: Medicare HMO | Admitting: Nutrition

## 2020-03-09 ENCOUNTER — Inpatient Hospital Stay: Payer: Medicare HMO | Attending: Hematology

## 2020-03-09 ENCOUNTER — Encounter: Payer: Self-pay | Admitting: Hematology and Oncology

## 2020-03-09 ENCOUNTER — Other Ambulatory Visit: Payer: Self-pay | Admitting: Hematology and Oncology

## 2020-03-09 ENCOUNTER — Inpatient Hospital Stay: Payer: Medicare HMO | Admitting: Hematology and Oncology

## 2020-03-09 ENCOUNTER — Telehealth: Payer: Self-pay | Admitting: Hematology and Oncology

## 2020-03-09 ENCOUNTER — Inpatient Hospital Stay: Payer: Medicare HMO

## 2020-03-09 DIAGNOSIS — C321 Malignant neoplasm of supraglottis: Secondary | ICD-10-CM | POA: Insufficient documentation

## 2020-03-09 DIAGNOSIS — Z9221 Personal history of antineoplastic chemotherapy: Secondary | ICD-10-CM | POA: Insufficient documentation

## 2020-03-09 DIAGNOSIS — C329 Malignant neoplasm of larynx, unspecified: Secondary | ICD-10-CM

## 2020-03-09 DIAGNOSIS — Z87891 Personal history of nicotine dependence: Secondary | ICD-10-CM | POA: Diagnosis not present

## 2020-03-09 DIAGNOSIS — G893 Neoplasm related pain (acute) (chronic): Secondary | ICD-10-CM | POA: Diagnosis not present

## 2020-03-09 DIAGNOSIS — E039 Hypothyroidism, unspecified: Secondary | ICD-10-CM

## 2020-03-09 DIAGNOSIS — Z5112 Encounter for antineoplastic immunotherapy: Secondary | ICD-10-CM | POA: Diagnosis present

## 2020-03-09 DIAGNOSIS — E209 Hypoparathyroidism, unspecified: Secondary | ICD-10-CM

## 2020-03-09 LAB — CBC WITH DIFFERENTIAL/PLATELET
Abs Immature Granulocytes: 0.02 10*3/uL (ref 0.00–0.07)
Basophils Absolute: 0 10*3/uL (ref 0.0–0.1)
Basophils Relative: 1 %
Eosinophils Absolute: 0.2 10*3/uL (ref 0.0–0.5)
Eosinophils Relative: 4 %
HCT: 38.4 % (ref 36.0–46.0)
Hemoglobin: 12.4 g/dL (ref 12.0–15.0)
Immature Granulocytes: 1 %
Lymphocytes Relative: 22 %
Lymphs Abs: 1 10*3/uL (ref 0.7–4.0)
MCH: 27.4 pg (ref 26.0–34.0)
MCHC: 32.3 g/dL (ref 30.0–36.0)
MCV: 84.8 fL (ref 80.0–100.0)
Monocytes Absolute: 0.5 10*3/uL (ref 0.1–1.0)
Monocytes Relative: 11 %
Neutro Abs: 2.8 10*3/uL (ref 1.7–7.7)
Neutrophils Relative %: 61 %
Platelets: 216 10*3/uL (ref 150–400)
RBC: 4.53 MIL/uL (ref 3.87–5.11)
RDW: 17.2 % — ABNORMAL HIGH (ref 11.5–15.5)
WBC: 4.4 10*3/uL (ref 4.0–10.5)
nRBC: 0 % (ref 0.0–0.2)

## 2020-03-09 LAB — COMPREHENSIVE METABOLIC PANEL
ALT: 23 U/L (ref 0–44)
AST: 19 U/L (ref 15–41)
Albumin: 3.8 g/dL (ref 3.5–5.0)
Alkaline Phosphatase: 86 U/L (ref 38–126)
Anion gap: 9 (ref 5–15)
BUN: 26 mg/dL — ABNORMAL HIGH (ref 8–23)
CO2: 27 mmol/L (ref 22–32)
Calcium: 9 mg/dL (ref 8.9–10.3)
Chloride: 103 mmol/L (ref 98–111)
Creatinine, Ser: 0.74 mg/dL (ref 0.44–1.00)
GFR calc Af Amer: >60 mL/min (ref >60)
GFR calc non Af Amer: >60 mL/min (ref >60)
Glucose, Bld: 97 mg/dL (ref 70–99)
Potassium: 4.5 mmol/L (ref 3.5–5.1)
Sodium: 139 mmol/L (ref 135–145)
Total Bilirubin: 0.3 mg/dL (ref 0.3–1.2)
Total Protein: 7.3 g/dL (ref 6.5–8.1)

## 2020-03-09 LAB — GLUCOSE, CAPILLARY: Glucose-Capillary: 88 mg/dL (ref 70–99)

## 2020-03-09 LAB — T4, FREE: Free T4: 1.18 ng/dL — ABNORMAL HIGH (ref 0.61–1.12)

## 2020-03-09 LAB — TSH: TSH: 0.934 u[IU]/mL (ref 0.308–3.960)

## 2020-03-09 MED ORDER — HEPARIN SOD (PORK) LOCK FLUSH 100 UNIT/ML IV SOLN
500.0000 [IU] | Freq: Once | INTRAVENOUS | Status: AC | PRN
  Administered 2020-03-09: 500 [IU]
  Filled 2020-03-09: qty 5

## 2020-03-09 MED ORDER — SODIUM CHLORIDE 0.9 % IV SOLN
Freq: Once | INTRAVENOUS | Status: AC
  Filled 2020-03-09: qty 250

## 2020-03-09 MED ORDER — SODIUM CHLORIDE 0.9 % IV SOLN
200.0000 mg | Freq: Once | INTRAVENOUS | Status: AC
  Administered 2020-03-09: 200 mg via INTRAVENOUS
  Filled 2020-03-09: qty 8

## 2020-03-09 MED ORDER — SODIUM CHLORIDE 0.9% FLUSH
10.0000 mL | INTRAVENOUS | Status: DC | PRN
  Administered 2020-03-09: 10 mL
  Filled 2020-03-09: qty 10

## 2020-03-09 NOTE — Telephone Encounter (Signed)
Scheduled per 6/17 sch message. Spoke with pt's friend and is aware of appts on 7/8.

## 2020-03-09 NOTE — Assessment & Plan Note (Signed)
This is likely due to prior radiation exposure We will continue to check her thyroid level and adjust medicine as needed

## 2020-03-09 NOTE — Patient Instructions (Signed)
[facility] Discharge Instructions for Patients Receiving Chemotherapy  Today you received the following chemotherapy agents :  Keytruda.  To help prevent nausea and vomiting after your treatment, we encourage you to take your nausea medication as prescribed.   If you develop nausea and vomiting that is not controlled by your nausea medication, call the clinic.   BELOW ARE SYMPTOMS THAT SHOULD BE REPORTED IMMEDIATELY:  *FEVER GREATER THAN 100.5 F  *CHILLS WITH OR WITHOUT FEVER  NAUSEA AND VOMITING THAT IS NOT CONTROLLED WITH YOUR NAUSEA MEDICATION  *UNUSUAL SHORTNESS OF BREATH  *UNUSUAL BRUISING OR BLEEDING  TENDERNESS IN MOUTH AND THROAT WITH OR WITHOUT PRESENCE OF ULCERS  *URINARY PROBLEMS  *BOWEL PROBLEMS  UNUSUAL RASH Items with * indicate a potential emergency and should be followed up as soon as possible.  Feel free to call the clinic should you have any questions or concerns. The clinic phone number is [phone].  Please show the CHEMO ALERT CARD at check-in to the Emergency Department and triage nurse.  

## 2020-03-09 NOTE — Progress Notes (Signed)
Brief follow-up with patient during infusion for recurrent laryngeal cancer. Patient continues Dillard [employer] Peptide 1.5 via feeding tube. She prefers the vanilla flavor and is requesting samples. She also is asking for a 60 cc syringe.  Noted weight has improved and was documented as 127.4 pounds. Labs reviewed.  Nutrition diagnosis: Unintended weight loss is improving.  Intervention: Patient to continue infusing Dillard Essex peptide 1.5 via feeding tube with goal of 4 bottles daily.  She is also using 3 bottles of boost daily. Patient has Dillard Essex ordering information. She also has my contact information for questions.  Monitoring, evaluation, goals: Patient will continue to increase calories and protein to promote weight maintenance/weight gain.  Next visit: To be scheduled as needed.  Please refer back to RD if needs are identified.  **Disclaimer: This note was dictated with voice recognition software. Similar sounding words can inadvertently be transcribed and this note may contain transcription errors which may not have been corrected upon publication of note.**

## 2020-03-09 NOTE — Progress Notes (Signed)
[place] OFFICE PROGRESS NOTE  Patient Care Team: Hamrick, Lorin Mercy, MD as PCP - General (Family Medicine) Eppie Gibson, MD as Attending Physician (Radiation Oncology) Tish Men, MD as Consulting Physician (Hematology) Leota Sauers, RN (Inactive) as Oncology Nurse Navigator Karie Mainland, RD as Dietitian (Nutrition) Sharen Counter, CCC-SLP as Speech Language Pathologist (Speech Pathology) Malmfelt, Stephani Police, RN as Oncology Nurse Navigator (Oncology)  ASSESSMENT & PLAN:  Laryngeal squamous cell carcinoma [facility]) I reviewed multiple PET CT scan with the patient and her son She have excellent response to therapy I recommend we continue single agent pembrolizumab every 3 weeks for 3 more months before repeating another imaging study, probably around September So far, she has no complications or side effects from treatment  Cancer associated pain Her pain is well controlled We discussed narcotic refill policy  Hypothyroidism This is likely due to prior radiation exposure We will continue to check her thyroid level and adjust medicine as needed   No orders of the defined types were placed in this encounter.   All questions were answered. The patient knows to call the clinic with any problems, questions or concerns. The total time spent in the appointment was 20 minutes encounter with patients including review of chart and various tests results, discussions about plan of care and coordination of care plan   Heath Lark, MD 03/09/2020 12:06 PM  INTERVAL HISTORY: Please see below for problem oriented charting. She returns with her son for further follow-up She is doing well and tolerated treatment well so far Her pain is well controlled She needs to see ENT regarding concerns about her trach with some mild discharge No infusion reaction  SUMMARY OF ONCOLOGIC HISTORY: Oncology History  Laryngeal squamous cell carcinoma ([place])  12/02/2018 Imaging   CT  neck: IMPRESSION: 1. Supraglottic laryngeal carcinoma extending across the anterior commissure and invading the prelaryngeal fat, left aryepiglottic fold and left aspect of the epiglottis. 2. Severe narrowing of the laryngeal airway. 3. Bilateral subcentimeter level 2A and 2A cervical lymph nodes.     12/02/2018 Imaging   CT  Chest:  IMPRESSION: No evidence of metastatic disease in the chest.   12/03/2018 Pathology Results   (Care Everywhere; [facility]) A: Larynx, supraglottis, biopsy - Atypical squamous proliferation with at least low grade dysplasia and abundant keratinization - No definite high grade dysplasia or invasive carcinoma represented (see comment)   12/18/2018 Pathology Results   (Care Everywhere; [facility]) A:  Neck, left, fine needle aspiration - Rare single atypical cells, no cells diagnostic of malignancy - Lymph node material present  B:  Neck, right, fine needle aspiration - Non-diagnostic specimen - No lymph node material identified - Mixed inflammation and blood   12/31/2018 Pathology Results   (Care Everywhere; [facility])  A: Larynx, left supraglottic mass, biopsy - Atypical squamous proliferation with abundant keratinization, suspicious for well-differentiated invasive squamous cell carcinoma  B: Larynx, left supraglottic mass, biopsy - Well-differentiated invasive squamous cell carcinoma with abundant keratinization (see comment)   01/18/2019 Initial Diagnosis   Laryngeal cancer ([place])   01/26/2019 Imaging   1. Locally advanced hypermetabolic laryngeal neoplasm centered in the left supraglottic space with bilateral glottic and epiglottic involvement and subglottic extension on the left. 2. Hypermetabolic bilateral level 3 neck nodal metastases. 3. Hypermetabolic high mediastinal nodal metastases between the trachea and upper thoracic esophagus. 4. Otherwise no distant hypermetabolic metastatic disease. 5. Nodular 7 mm focus at the left lung base,  below PET resolution,  recommend attention on follow-up chest CT in 3 months. 6. Chronic findings include: Aortic Atherosclerosis (ICD10-I70.0). Cholelithiasis.   02/08/2019 Cancer Staging   Staging form: Larynx - Supraglottis, AJCC 8th Edition - Clinical stage from 02/08/2019: Stage IVA (cT3, cN2c, cM0) - Signed by Eppie Gibson, MD on 02/08/2019   02/11/2019 - 04/07/2019 Chemotherapy   The patient weekly cisplatin for chemotherapy treatment.     07/20/2019 Pathology Results   PROCEDURE: Total glossectomy, total pharyngectomy, total laryngectomy, total thyroidectomy, and cervical esophagectomy TUMOR SITE: Larynx TUMOR LATERALITY: Not specified TUMOR FOCALITY: Unifocal TUMOR SIZE:    GREATEST DIMENSION: 6.9 cm    ADDITIONAL DIMENSIONS: 5.4 x 2.4 cm HISTOLOGIC TYPE: Squamous cell carcinoma, conventional (keratinizing) HISTOLOGIC GRADE:  G1-G2: Well to moderately differentiated TUMOR EXTENSION: Tumor involves the posterior tongue, pharynx, larynx, tracheal cartilage, hyoid bone, cervical esophagus, vagus nerve, carotid artery, deep cervical fascia, ans surrounding skeletal muscle and soft tissue MARGINS: Uninvolved by invasive tumor; Perineural invasion is less than 61mm from the left soft tissue margin LYMPHOVASCULAR INVASION: Not identified PERINEURAL INVASION:  Present REGIONAL LYMPH NODES:     NUMBER OF LYMPH NODES INVOLVED: 2     NUMBER OF LYMPH NODES EXAMINED: 24     LATERALITY OF LYMPH NODES INVOLVED: Cannot determine     SIZE OF LARGEST METASTATIC DEPOSIT: 0.8 cm     EXTRANODAL EXTENSION: Not identified PATHOLOGIC STAGE CLASSIFICATION (pTNM, AJCC 8TH Ed): (r)(y) pT4b pN2a DISTANT METASTASIS (pM): ADDITIONAL PATHOLOGIC FINDINGS: Previous chemoradiation   11/04/2019 Imaging   PET: 1.  Hypermetabolic bilateral neck lesions as detailed above are concerning for disease recurrence. 2.  Compared to 07/08/2019 PET scan, interval development of subcentimeter bilateral upper lobe  pulmonary nodules, size below PET resolution. These are concerning for metastasis. Consider diagnostic quality CT chest exam for further evaluation as per oncology treatment planning. 3.  Focal hypermetabolic uptake in right ventricular cavity of indeterminate significance. Recommend echocardiographic examination to rule out underlying thrombus. This finding was discussed with Dr. Conley Canal by Dr. Clementeen Graham and Dr. Marcello Moores on 11/04/2019 at approximately 3:00 PM via telephone. 4.  Ancillary CT findings as above.   11/05/2019 Imaging   Echocardiogram: Summary   1. The left ventricle is normal in size with normal wall thickness.   2. The left ventricular systolic function is normal, LVEF is visually estimated at 60-65%.   3. The right ventricle is normal in size, with normal systolic function.   4. There is an extrinsic compression of the RV and RA laterally at the AV groove by a mass of 4.3 cm diameter. Unclear if the mass is invasive, although no pericardial effusion seen suggestive no invasion possibly. There is no evidence of hemodynamic compromise secondary to cardiac compression with normal IVC diameter.   12/06/2019 Pathology Results   A. LYMPH NODE, LEFT SUBMANDIBULAR, BIOPSY:  - Squamous cell carcinoma.  - No distinct nodal tissue identified.    12/06/2019 Procedure   Successful ultrasound-guided core biopsy of centrally necrotic left submandibular lymph node. Of note, due to extensive prior postsurgical and post radiation changes, patient has very limited mobility in the neck and the biopsy was extremely challenging    12/16/2019 -  Chemotherapy   The patient had pembrolizumab for chemotherapy treatment.     03/08/2020 PET scan   1. Diminished size of masses in the neck as described. Persistent increased metabolic activity particularly in the LEFT neck. Some of this diminished size on the LEFT could be due to decompression of necrotic material. Correlate with  any symptoms of inflammation or  infection in this area as well given the paucity of residual soft tissue associated with the above abnormality, overall findings are compatible with residual disease in this location and there is no significant surrounding stranding to indicate inflammation that would correlate with the degree of FDG uptake remaining. 2. Diminished activity in the juxta cardiac mass with peripheral activity suggesting interval necrosis, on the prior study this was uniformly hypermetabolic. There is still considerable FDG uptake. 3. No signs of disease in the abdomen or pelvis. 4. 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 Correlate with any new pain or symptoms in this area with further imaging as warranted. No discrete mass or bone destruction on today's study.     REVIEW OF SYSTEMS:   Constitutional: Denies fevers, chills or abnormal weight loss Eyes: Denies blurriness of vision Ears, nose, mouth, throat, and face: Denies mucositis or sore throat Respiratory: Denies cough, dyspnea or wheezes Cardiovascular: Denies palpitation, chest discomfort or lower extremity swelling Gastrointestinal:  Denies nausea, heartburn or change in bowel habits Skin: Denies abnormal skin rashes Lymphatics: Denies new lymphadenopathy or easy bruising Neurological:Denies numbness, tingling or new weaknesses Behavioral/Psych: Mood is stable, no new changes  All other systems were reviewed with the patient and are negative.  I have reviewed the past medical history, past surgical history, social history and family history with the patient and they are unchanged from previous note.  ALLERGIES:  has No Known Allergies.  MEDICATIONS:  Current Outpatient Medications  Medication Sig Dispense Refill  . chlorhexidine (PERIDEX) 0.12 % solution Rinse with 15 mls twice daily for 30 seconds. Use after breakfast and at bedtime. Spit out excess. Do not swallow. 480 mL 3  . HYDROcodone-acetaminophen (NORCO) 5-325 MG tablet Take 1 tablet by  mouth every 8 (eight) hours as needed for moderate pain. 60 tablet 0  . levothyroxine (SYNTHROID) 137 MCG tablet Take 137 mcg by mouth daily before breakfast.    . lidocaine-prilocaine (EMLA) cream Apply to affected area once 30 g 3  . morphine (MSIR) 15 MG tablet Take 15 mg by mouth at bedtime.    . Nutritional Supplements (KATE FARMS PEPTIDE 1.5) LIQD Take 4 Bottles by mouth daily. 325 mL 6  . ondansetron (ZOFRAN) 8 MG tablet Take 1 tablet (8 mg total) by mouth 2 (two) times daily as needed (Nausea or vomiting). 30 tablet 1  . prochlorperazine (COMPAZINE) 10 MG tablet Take 1 tablet (10 mg total) by mouth every 6 (six) hours as needed (Nausea or vomiting). 30 tablet 1  . sodium fluoride (PREVIDENT 5000 PLUS) 1.1 % CREA dental cream Apply cream to tooth brush. Brush teeth for 2 minutes. Spit out excess. DO NOT rinse afterwards. Repeat nightly. (Patient taking differently: Place 1 application onto teeth 2 (two) times a day. ) 1 Tube prn   No current facility-administered medications for this visit.   Facility-Administered Medications Ordered in Other Visits  Medication Dose Route Frequency Provider Last Rate Last Admin  . heparin lock flush 100 unit/mL  500 Units Intracatheter Once PRN Alvy Bimler, Joslynne Klatt, MD      . pembrolizumab (KEYTRUDA) 200 mg in sodium chloride 0.9 % 50 mL chemo infusion  200 mg Intravenous Once Crissy Mccreadie, MD      . sodium chloride flush (NS) 0.9 % injection 10 mL  10 mL Intracatheter PRN Alvy Bimler, Lelon Ikard, MD        PHYSICAL EXAMINATION: ECOG PERFORMANCE STATUS: 1 - Symptomatic but completely ambulatory  Vitals:   03/09/20  1143  BP: 107/62  Pulse: 78  Resp: 18  Temp: 98.9 F (37.2 C)  SpO2: 99%   Filed Weights   03/09/20 1143  Weight: 127 lb 6.4 oz (57.8 kg)    GENERAL:alert, no distress and comfortable Musculoskeletal:no cyanosis of digits and no clubbing  NEURO: alert & oriented x 3 with fluent speech, no focal motor/sensory deficits  LABORATORY DATA:  I have  reviewed the data as listed    Component Value Date/Time   NA 139 03/09/2020 1112   K 4.5 03/09/2020 1112   CL 103 03/09/2020 1112   CO2 27 03/09/2020 1112   GLUCOSE 97 03/09/2020 1112   BUN 26 (H) 03/09/2020 1112   CREATININE 0.74 03/09/2020 1112   CREATININE 0.60 02/17/2020 1128   CALCIUM 9.0 03/09/2020 1112   PROT 7.3 03/09/2020 1112   ALBUMIN 3.8 03/09/2020 1112   AST 19 03/09/2020 1112   AST 22 02/17/2020 1128   ALT 23 03/09/2020 1112   ALT 24 02/17/2020 1128   ALKPHOS 86 03/09/2020 1112   BILITOT 0.3 03/09/2020 1112   BILITOT 0.5 02/17/2020 1128   GFRNONAA >60 03/09/2020 1112   GFRNONAA >60 02/17/2020 1128   GFRAA >60 03/09/2020 1112   GFRAA >60 02/17/2020 1128    No results found for: SPEP, UPEP  Lab Results  Component Value Date   WBC 4.4 03/09/2020   NEUTROABS 2.8 03/09/2020   HGB 12.4 03/09/2020   HCT 38.4 03/09/2020   MCV 84.8 03/09/2020   PLT 216 03/09/2020      Chemistry      Component Value Date/Time   NA 139 03/09/2020 1112   K 4.5 03/09/2020 1112   CL 103 03/09/2020 1112   CO2 27 03/09/2020 1112   BUN 26 (H) 03/09/2020 1112   CREATININE 0.74 03/09/2020 1112   CREATININE 0.60 02/17/2020 1128      Component Value Date/Time   CALCIUM 9.0 03/09/2020 1112   ALKPHOS 86 03/09/2020 1112   AST 19 03/09/2020 1112   AST 22 02/17/2020 1128   ALT 23 03/09/2020 1112   ALT 24 02/17/2020 1128   BILITOT 0.3 03/09/2020 1112   BILITOT 0.5 02/17/2020 1128       RADIOGRAPHIC STUDIES: I have reviewed multiple imaging studies with the patient I have personally reviewed the radiological images as listed and agreed with the findings in the report. NM PET Image Restag (PS) Skull Base To Thigh  Result Date: 03/08/2020 CLINICAL DATA:  Subsequent treatment strategy for head neck cancer, diagnosed in March of 2020 with LEFT supraglottic mass post total glossectomy pharyngectomy and laryngectomy with 2 of 24 nodes positive., found to have recurrence on February of  2021 PET exam which is available for review, undergoing immunotherapy as of 12/16/2019. EXAM: NUCLEAR MEDICINE PET SKULL BASE TO THIGH TECHNIQUE: 6.37 mCi F-18 FDG was injected intravenously. Full-ring PET imaging was performed from the skull base to thigh after the radiotracer. CT data was obtained and used for attenuation correction and anatomic localization. Fasting blood glucose: 88 mg/dl COMPARISON:  11/04/2019 FINDINGS: Mediastinal blood pool activity: SUV max 2.19 Liver activity: SUV max NA NECK: Postoperative changes in the neck as before with signs of glossectomy and laryngectomy. Marked interval decrease in size of the mass at the angle of the LEFT mandible (image 29 of series 4) this measures approximately 2.0 by 1.4 cm and abuts the angle of the LEFT mandible previously measuring 3.5 x 3.7 cm (SUVmax = 12.0) previously 14.3 small amount of gas  seen in the central portion at the site of prior necrosis based on the FDG uptake in the periphery on the prior study. Soft tissue in the region of the deep parotid on the RIGHT also along the angle of the mandible and adjacent to the carotid artery, slightly diminished in size compared to the prior study approximately 14 x 9 mm (image 26, series 4), previously approximately 18 x 16 mm (SUVmax = 4.3) previously 5.7 no new sites of FDG uptake in the neck. Incidental CT findings: Postoperative changes as above. CHEST: Choose 1 small RIGHT upper lobe pulmonary nodules with new nodule anteriorly on image 13 of series 8 measuring 4.5 mm. Other small nodules in the RIGHT upper lobe are unchanged. Small LEFT upper lobe pulmonary nodule (image 11, series 60) approximately 6 mm unchanged and without FDG uptake above background activity. Juxta cardiac mass measures approximately 5.4 x 4.1 cm (image 82, series 4) peripheral increased FDG uptake on today's study. Note that this could be confounded by surrounding cardiac activity (SUVmax = 8.2) previously 12.2 Incidental CT  findings: Calcified atheromatous plaque of the thoracic aorta. No pericardial effusion despite large mass along the margin of the heart. Areas not well-defined given lack of intravenous contrast. RIGHT-sided Port-A-Cath terminates at the caval to atrial junction. Basilar atelectasis in addition to pulmonary nodules discussed above. ABDOMEN/PELVIS: No abnormal hypermetabolic activity within the liver, pancreas, adrenal glands, or spleen. No hypermetabolic lymph nodes in the abdomen or pelvis. Incidental CT findings: Cholelithiasis and G-tube. SKELETON: Focal increased metabolic activity at LEFT ischium/hamstring insertion mild increased uptake seen on the RIGHT but not as pronounced. Mild duct takes scattered throughout degenerative areas of the spine and in the bilateral shoulders no focal area of metabolic activity to suggest metastasis. Incidental CT findings: Spinal degenerative changes as above IMPRESSION: 1. Diminished size of masses in the neck as described. Persistent increased metabolic activity particularly in the LEFT neck. Some of this diminished size on the LEFT could be due to decompression of necrotic material. Correlate with any symptoms of inflammation or infection in this area as well given the paucity of residual soft tissue associated with the above abnormality, overall findings are compatible with residual disease in this location and there is no significant surrounding stranding to indicate inflammation that would correlate with the degree of FDG uptake remaining. 2. Diminished activity in the juxta cardiac mass with peripheral activity suggesting interval necrosis, on the prior study this was uniformly hypermetabolic. There is still considerable FDG uptake. 3. No signs of disease in the abdomen or pelvis. 4. 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 Correlate with any new pain or symptoms in this area with further imaging as warranted. No discrete mass or bone destruction on today's study.  These results will be called to the ordering clinician or representative by the Radiologist Assistant, and communication documented in the PACS or [employer]. Electronically Signed   By: Zetta Bills M.D.   On: 03/08/2020 13:12

## 2020-03-09 NOTE — Assessment & Plan Note (Signed)
Her pain is well controlled We discussed narcotic refill policy

## 2020-03-09 NOTE — Assessment & Plan Note (Signed)
I reviewed multiple PET CT scan with the patient and her son She have excellent response to therapy I recommend we continue single agent pembrolizumab every 3 weeks for 3 more months before repeating another imaging study, probably around September So far, she has no complications or side effects from treatment

## 2020-03-16 DIAGNOSIS — C321 Malignant neoplasm of supraglottis: Secondary | ICD-10-CM | POA: Diagnosis not present

## 2020-03-16 DIAGNOSIS — R633 Feeding difficulties: Secondary | ICD-10-CM | POA: Diagnosis not present

## 2020-03-20 DIAGNOSIS — Z6823 Body mass index (BMI) 23.0-23.9, adult: Secondary | ICD-10-CM | POA: Diagnosis not present

## 2020-03-20 DIAGNOSIS — E039 Hypothyroidism, unspecified: Secondary | ICD-10-CM | POA: Diagnosis not present

## 2020-03-20 DIAGNOSIS — E559 Vitamin D deficiency, unspecified: Secondary | ICD-10-CM | POA: Diagnosis not present

## 2020-03-20 DIAGNOSIS — E892 Postprocedural hypoparathyroidism: Secondary | ICD-10-CM | POA: Diagnosis not present

## 2020-03-21 DIAGNOSIS — Z430 Encounter for attention to tracheostomy: Secondary | ICD-10-CM | POA: Diagnosis not present

## 2020-03-30 ENCOUNTER — Telehealth: Payer: Self-pay

## 2020-03-30 ENCOUNTER — Encounter: Payer: Self-pay | Admitting: Hematology and Oncology

## 2020-03-30 ENCOUNTER — Inpatient Hospital Stay: Payer: Medicare HMO | Attending: Hematology

## 2020-03-30 ENCOUNTER — Inpatient Hospital Stay: Payer: Medicare HMO

## 2020-03-30 ENCOUNTER — Other Ambulatory Visit: Payer: Self-pay

## 2020-03-30 ENCOUNTER — Inpatient Hospital Stay: Payer: Medicare HMO | Admitting: Hematology and Oncology

## 2020-03-30 DIAGNOSIS — C329 Malignant neoplasm of larynx, unspecified: Secondary | ICD-10-CM

## 2020-03-30 DIAGNOSIS — C321 Malignant neoplasm of supraglottis: Secondary | ICD-10-CM

## 2020-03-30 DIAGNOSIS — G893 Neoplasm related pain (acute) (chronic): Secondary | ICD-10-CM | POA: Insufficient documentation

## 2020-03-30 DIAGNOSIS — E039 Hypothyroidism, unspecified: Secondary | ICD-10-CM | POA: Diagnosis not present

## 2020-03-30 DIAGNOSIS — Z430 Encounter for attention to tracheostomy: Secondary | ICD-10-CM

## 2020-03-30 DIAGNOSIS — Z87891 Personal history of nicotine dependence: Secondary | ICD-10-CM | POA: Diagnosis not present

## 2020-03-30 DIAGNOSIS — Z5112 Encounter for antineoplastic immunotherapy: Secondary | ICD-10-CM | POA: Insufficient documentation

## 2020-03-30 DIAGNOSIS — Z930 Tracheostomy status: Secondary | ICD-10-CM | POA: Diagnosis not present

## 2020-03-30 DIAGNOSIS — Z9221 Personal history of antineoplastic chemotherapy: Secondary | ICD-10-CM | POA: Diagnosis not present

## 2020-03-30 DIAGNOSIS — Z95828 Presence of other vascular implants and grafts: Secondary | ICD-10-CM

## 2020-03-30 LAB — COMPREHENSIVE METABOLIC PANEL
ALT: 27 U/L (ref 0–44)
AST: 21 U/L (ref 15–41)
Albumin: 3.9 g/dL (ref 3.5–5.0)
Alkaline Phosphatase: 86 U/L (ref 38–126)
Anion gap: 12 (ref 5–15)
BUN: 31 mg/dL — ABNORMAL HIGH (ref 8–23)
CO2: 26 mmol/L (ref 22–32)
Calcium: 9 mg/dL (ref 8.9–10.3)
Chloride: 102 mmol/L (ref 98–111)
Creatinine, Ser: 0.78 mg/dL (ref 0.44–1.00)
GFR calc Af Amer: >60 mL/min (ref >60)
GFR calc non Af Amer: >60 mL/min (ref >60)
Glucose, Bld: 102 mg/dL — ABNORMAL HIGH (ref 70–99)
Potassium: 4.5 mmol/L (ref 3.5–5.1)
Sodium: 140 mmol/L (ref 135–145)
Total Bilirubin: <0.2 mg/dL — ABNORMAL LOW (ref 0.3–1.2)
Total Protein: 7.3 g/dL (ref 6.5–8.1)

## 2020-03-30 LAB — CBC WITH DIFFERENTIAL/PLATELET
Abs Immature Granulocytes: 0.01 10*3/uL (ref 0.00–0.07)
Basophils Absolute: 0 10*3/uL (ref 0.0–0.1)
Basophils Relative: 0 %
Eosinophils Absolute: 0.2 10*3/uL (ref 0.0–0.5)
Eosinophils Relative: 5 %
HCT: 38.9 % (ref 36.0–46.0)
Hemoglobin: 12.7 g/dL (ref 12.0–15.0)
Immature Granulocytes: 0 %
Lymphocytes Relative: 23 %
Lymphs Abs: 1.2 10*3/uL (ref 0.7–4.0)
MCH: 27.9 pg (ref 26.0–34.0)
MCHC: 32.6 g/dL (ref 30.0–36.0)
MCV: 85.5 fL (ref 80.0–100.0)
Monocytes Absolute: 0.6 10*3/uL (ref 0.1–1.0)
Monocytes Relative: 11 %
Neutro Abs: 3 10*3/uL (ref 1.7–7.7)
Neutrophils Relative %: 61 %
Platelets: 210 10*3/uL (ref 150–400)
RBC: 4.55 MIL/uL (ref 3.87–5.11)
RDW: 16.3 % — ABNORMAL HIGH (ref 11.5–15.5)
WBC: 4.9 10*3/uL (ref 4.0–10.5)
nRBC: 0 % (ref 0.0–0.2)

## 2020-03-30 LAB — TSH: TSH: 1.547 u[IU]/mL (ref 0.308–3.960)

## 2020-03-30 LAB — T4, FREE: Free T4: 0.96 ng/dL (ref 0.61–1.12)

## 2020-03-30 MED ORDER — SODIUM CHLORIDE 0.9% FLUSH
10.0000 mL | INTRAVENOUS | Status: DC | PRN
  Administered 2020-03-30: 10 mL via INTRAVENOUS
  Filled 2020-03-30: qty 10

## 2020-03-30 MED ORDER — SODIUM CHLORIDE 0.9 % IV SOLN
200.0000 mg | Freq: Once | INTRAVENOUS | Status: AC
  Administered 2020-03-30: 200 mg via INTRAVENOUS
  Filled 2020-03-30: qty 8

## 2020-03-30 MED ORDER — HEPARIN SOD (PORK) LOCK FLUSH 100 UNIT/ML IV SOLN
500.0000 [IU] | Freq: Once | INTRAVENOUS | Status: AC | PRN
  Administered 2020-03-30: 500 [IU]
  Filled 2020-03-30: qty 5

## 2020-03-30 MED ORDER — SODIUM CHLORIDE 0.9 % IV SOLN
Freq: Once | INTRAVENOUS | Status: AC
  Filled 2020-03-30: qty 250

## 2020-03-30 MED ORDER — HYDROCODONE-ACETAMINOPHEN 5-325 MG PO TABS: 1.0000 | ORAL_TABLET | Freq: Three times a day (TID) | ORAL | 0 refills | Status: DC | PRN

## 2020-03-30 MED ORDER — SODIUM CHLORIDE 0.9% FLUSH
10.0000 mL | INTRAVENOUS | Status: DC | PRN
  Administered 2020-03-30: 10 mL
  Filled 2020-03-30: qty 10

## 2020-03-30 NOTE — Patient Instructions (Signed)
[place] Discharge Instructions for Patients Receiving Chemotherapy  Today you received the following immunotherapy agent: Pembrolizumab Beryle Flock)  To help prevent nausea and vomiting after your treatment, we encourage you to take your nausea medication as directed by your MD.   If you develop nausea and vomiting that is not controlled by your nausea medication, call the clinic.   BELOW ARE SYMPTOMS THAT SHOULD BE REPORTED IMMEDIATELY:  *FEVER GREATER THAN 100.5 F  *CHILLS WITH OR WITHOUT FEVER  NAUSEA AND VOMITING THAT IS NOT CONTROLLED WITH YOUR NAUSEA MEDICATION  *UNUSUAL SHORTNESS OF BREATH  *UNUSUAL BRUISING OR BLEEDING  TENDERNESS IN MOUTH AND THROAT WITH OR WITHOUT PRESENCE OF ULCERS  *URINARY PROBLEMS  *BOWEL PROBLEMS  UNUSUAL RASH Items with * indicate a potential emergency and should be followed up as soon as possible.  Feel free to call the clinic should you have any questions or concerns. The clinic phone number is [phone].  Please show the [place] at check-in to the Emergency Department and triage nurse.

## 2020-03-30 NOTE — Assessment & Plan Note (Signed)
She request local care I will refer her to see a local ENT physician for tracheostomy site management

## 2020-03-30 NOTE — Assessment & Plan Note (Signed)
Her pain is well controlled We discussed narcotic refill policy I refill her prescription today

## 2020-03-30 NOTE — Patient Instructions (Signed)

## 2020-03-30 NOTE — Assessment & Plan Note (Signed)
This is likely due to prior radiation exposure We will continue to check her thyroid level and adjust medicine as needed

## 2020-03-30 NOTE — Assessment & Plan Note (Signed)
She have excellent response to therapy I recommend we continue single agent pembrolizumab every 3 weeks for a few more months before repeating another imaging study, probably around September So far, she has no complications or side effects from treatment

## 2020-03-30 NOTE — Telephone Encounter (Signed)
Called regarding ENT referral to Dr. Lucia Gaskins. They can see the referral in Epic and will call patient.

## 2020-03-30 NOTE — Progress Notes (Signed)
[place] OFFICE PROGRESS NOTE  Patient Care Team: Hamrick, Lorin Mercy, MD as PCP - General (Family Medicine) Eppie Gibson, MD as Attending Physician (Radiation Oncology) Tish Men, MD (Inactive) as Consulting Physician (Hematology) Leota Sauers, RN (Inactive) as Oncology Nurse Navigator Karie Mainland, RD as Dietitian (Nutrition) Sharen Counter, CCC-SLP as Speech Language Pathologist (Speech Pathology) Malmfelt, Stephani Police, RN as Oncology Nurse Navigator (Oncology)  ASSESSMENT & PLAN:  Laryngeal squamous cell carcinoma [facility]) She have excellent response to therapy I recommend we continue single agent pembrolizumab every 3 weeks for a few more months before repeating another imaging study, probably around September So far, she has no complications or side effects from treatment  Cancer associated pain Her pain is well controlled We discussed narcotic refill policy I refill her prescription today  Hypothyroidism This is likely due to prior radiation exposure We will continue to check her thyroid level and adjust medicine as needed  Tracheostomy care [facility]) She request local care I will refer her to see a local ENT physician for tracheostomy site management   Orders Placed This Encounter  Procedures  . Ambulatory referral to ENT    Referral Priority:   Routine    Referral Type:   Consultation    Referral Reason:   Specialty Services Required    Requested Specialty:   Otolaryngology    Number of Visits Requested:   1    All questions were answered. The patient knows to call the clinic with any problems, questions or concerns. The total time spent in the appointment was 20 minutes encounter with patients including review of chart and various tests results, discussions about plan of care and coordination of care plan   Heath Lark, MD 03/30/2020 2:00 PM  INTERVAL HISTORY: Please see below for problem oriented charting. She returns with her son for further  follow-up and treatment  since the last time I saw her, she is doing well Her chronic pain is stable She is requesting local ENT physician to manage her tracheostomy site She has not lost any weight No side effects of treatment  SUMMARY OF ONCOLOGIC HISTORY: Oncology History  Laryngeal squamous cell carcinoma ([place])  12/02/2018 Imaging   CT neck: IMPRESSION: 1. Supraglottic laryngeal carcinoma extending across the anterior commissure and invading the prelaryngeal fat, left aryepiglottic fold and left aspect of the epiglottis. 2. Severe narrowing of the laryngeal airway. 3. Bilateral subcentimeter level 2A and 2A cervical lymph nodes.     12/02/2018 Imaging   CT  Chest:  IMPRESSION: No evidence of metastatic disease in the chest.   12/03/2018 Pathology Results   (Care Everywhere; [facility]) A: Larynx, supraglottis, biopsy - Atypical squamous proliferation with at least low grade dysplasia and abundant keratinization - No definite high grade dysplasia or invasive carcinoma represented (see comment)   12/18/2018 Pathology Results   (Care Everywhere; [facility]) A:  Neck, left, fine needle aspiration - Rare single atypical cells, no cells diagnostic of malignancy - Lymph node material present  B:  Neck, right, fine needle aspiration - Non-diagnostic specimen - No lymph node material identified - Mixed inflammation and blood   12/31/2018 Pathology Results   (Care Everywhere; [facility])  A: Larynx, left supraglottic mass, biopsy - Atypical squamous proliferation with abundant keratinization, suspicious for well-differentiated invasive squamous cell carcinoma  B: Larynx, left supraglottic mass, biopsy - Well-differentiated invasive squamous cell carcinoma with abundant keratinization (see comment)   01/18/2019 Initial Diagnosis   Laryngeal cancer (HCC)  01/26/2019 Imaging   1. Locally advanced hypermetabolic laryngeal neoplasm centered in the left supraglottic space with  bilateral glottic and epiglottic involvement and subglottic extension on the left. 2. Hypermetabolic bilateral level 3 neck nodal metastases. 3. Hypermetabolic high mediastinal nodal metastases between the trachea and upper thoracic esophagus. 4. Otherwise no distant hypermetabolic metastatic disease. 5. Nodular 7 mm focus at the left lung base, below PET resolution, recommend attention on follow-up chest CT in 3 months. 6. Chronic findings include: Aortic Atherosclerosis (ICD10-I70.0). Cholelithiasis.   02/08/2019 Cancer Staging   Staging form: Larynx - Supraglottis, AJCC 8th Edition - Clinical stage from 02/08/2019: Stage IVA (cT3, cN2c, cM0) - Signed by Eppie Gibson, MD on 02/08/2019   02/11/2019 - 04/07/2019 Chemotherapy   The patient weekly cisplatin for chemotherapy treatment.     07/20/2019 Pathology Results   PROCEDURE: Total glossectomy, total pharyngectomy, total laryngectomy, total thyroidectomy, and cervical esophagectomy TUMOR SITE: Larynx TUMOR LATERALITY: Not specified TUMOR FOCALITY: Unifocal TUMOR SIZE:    GREATEST DIMENSION: 6.9 cm    ADDITIONAL DIMENSIONS: 5.4 x 2.4 cm HISTOLOGIC TYPE: Squamous cell carcinoma, conventional (keratinizing) HISTOLOGIC GRADE:  G1-G2: Well to moderately differentiated TUMOR EXTENSION: Tumor involves the posterior tongue, pharynx, larynx, tracheal cartilage, hyoid bone, cervical esophagus, vagus nerve, carotid artery, deep cervical fascia, ans surrounding skeletal muscle and soft tissue MARGINS: Uninvolved by invasive tumor; Perineural invasion is less than 74mm from the left soft tissue margin LYMPHOVASCULAR INVASION: Not identified PERINEURAL INVASION:  Present REGIONAL LYMPH NODES:     NUMBER OF LYMPH NODES INVOLVED: 2     NUMBER OF LYMPH NODES EXAMINED: 24     LATERALITY OF LYMPH NODES INVOLVED: Cannot determine     SIZE OF LARGEST METASTATIC DEPOSIT: 0.8 cm     EXTRANODAL EXTENSION: Not identified PATHOLOGIC STAGE CLASSIFICATION  (pTNM, AJCC 8TH Ed): (r)(y) pT4b pN2a DISTANT METASTASIS (pM): ADDITIONAL PATHOLOGIC FINDINGS: Previous chemoradiation   11/04/2019 Imaging   PET: 1.  Hypermetabolic bilateral neck lesions as detailed above are concerning for disease recurrence. 2.  Compared to 07/08/2019 PET scan, interval development of subcentimeter bilateral upper lobe pulmonary nodules, size below PET resolution. These are concerning for metastasis. Consider diagnostic quality CT chest exam for further evaluation as per oncology treatment planning. 3.  Focal hypermetabolic uptake in right ventricular cavity of indeterminate significance. Recommend echocardiographic examination to rule out underlying thrombus. This finding was discussed with Dr. Conley Canal by Dr. Clementeen Graham and Dr. Marcello Moores on 11/04/2019 at approximately 3:00 PM via telephone. 4.  Ancillary CT findings as above.   11/05/2019 Imaging   Echocardiogram: Summary   1. The left ventricle is normal in size with normal wall thickness.   2. The left ventricular systolic function is normal, LVEF is visually estimated at 60-65%.   3. The right ventricle is normal in size, with normal systolic function.   4. There is an extrinsic compression of the RV and RA laterally at the AV groove by a mass of 4.3 cm diameter. Unclear if the mass is invasive, although no pericardial effusion seen suggestive no invasion possibly. There is no evidence of hemodynamic compromise secondary to cardiac compression with normal IVC diameter.   12/06/2019 Pathology Results   A. LYMPH NODE, LEFT SUBMANDIBULAR, BIOPSY:  - Squamous cell carcinoma.  - No distinct nodal tissue identified.    12/06/2019 Procedure   Successful ultrasound-guided core biopsy of centrally necrotic left submandibular lymph node. Of note, due to extensive prior postsurgical and post radiation changes, patient has very limited mobility in  the neck and the biopsy was extremely challenging    12/16/2019 -  Chemotherapy   The  patient had pembrolizumab for chemotherapy treatment.     03/08/2020 PET scan   1. Diminished size of masses in the neck as described. Persistent increased metabolic activity particularly in the LEFT neck. Some of this diminished size on the LEFT could be due to decompression of necrotic material. Correlate with any symptoms of inflammation or infection in this area as well given the paucity of residual soft tissue associated with the above abnormality, overall findings are compatible with residual disease in this location and there is no significant surrounding stranding to indicate inflammation that would correlate with the degree of FDG uptake remaining. 2. Diminished activity in the juxta cardiac mass with peripheral activity suggesting interval necrosis, on the prior study this was uniformly hypermetabolic. There is still considerable FDG uptake. 3. No signs of disease in the abdomen or pelvis. 4. 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 Correlate with any new pain or symptoms in this area with further imaging as warranted. No discrete mass or bone destruction on today's study.     REVIEW OF SYSTEMS:   Constitutional: Denies fevers, chills or abnormal weight loss Eyes: Denies blurriness of vision Respiratory: Denies cough, dyspnea or wheezes Cardiovascular: Denies palpitation, chest discomfort or lower extremity swelling Gastrointestinal:  Denies nausea, heartburn or change in bowel habits Skin: Denies abnormal skin rashes Lymphatics: Denies new lymphadenopathy or easy bruising Neurological:Denies numbness, tingling or new weaknesses Behavioral/Psych: Mood is stable, no new changes  All other systems were reviewed with the patient and are negative.  I have reviewed the past medical history, past surgical history, social history and family history with the patient and they are unchanged from previous note.  ALLERGIES:  has No Known Allergies.  MEDICATIONS:  Current Outpatient  Medications  Medication Sig Dispense Refill  . chlorhexidine (PERIDEX) 0.12 % solution Rinse with 15 mls twice daily for 30 seconds. Use after breakfast and at bedtime. Spit out excess. Do not swallow. 480 mL 3  . HYDROcodone-acetaminophen (NORCO) 5-325 MG tablet Take 1 tablet by mouth every 8 (eight) hours as needed for moderate pain. 60 tablet 0  . levothyroxine (SYNTHROID) 137 MCG tablet Take 137 mcg by mouth daily before breakfast.    . lidocaine-prilocaine (EMLA) cream Apply to affected area once 30 g 3  . Nutritional Supplements (KATE FARMS PEPTIDE 1.5) LIQD Take 4 Bottles by mouth daily. 325 mL 6  . ondansetron (ZOFRAN) 8 MG tablet Take 1 tablet (8 mg total) by mouth 2 (two) times daily as needed (Nausea or vomiting). 30 tablet 1  . prochlorperazine (COMPAZINE) 10 MG tablet Take 1 tablet (10 mg total) by mouth every 6 (six) hours as needed (Nausea or vomiting). 30 tablet 1  . sodium fluoride (PREVIDENT 5000 PLUS) 1.1 % CREA dental cream Apply cream to tooth brush. Brush teeth for 2 minutes. Spit out excess. DO NOT rinse afterwards. Repeat nightly. (Patient taking differently: Place 1 application onto teeth 2 (two) times a day. ) 1 Tube prn   No current facility-administered medications for this visit.    PHYSICAL EXAMINATION: ECOG PERFORMANCE STATUS: 1 - Symptomatic but completely ambulatory  Vitals:   03/30/20 1304 03/30/20 1306  BP: (!) 94/59 94/72  Pulse: 81   Resp: 18   Temp: 98.5 F (36.9 C)   SpO2: 100%    Filed Weights   03/30/20 1304  Weight: 131 lb (59.4 kg)    GENERAL:alert,  no distress and comfortable SKIN: skin color, texture, turgor are normal, no rashes or significant lesions EYES: normal, Conjunctiva are pink and non-injected, sclera clear OROPHARYNX:no exudate, no erythema and lips, buccal mucosa, and tongue normal  NECK: Tracheostomy site looks okay  LYMPH:  no palpable lymphadenopathy in the cervical, axillary or inguinal LUNGS: clear to auscultation and  percussion with normal breathing effort HEART: regular rate & rhythm and no murmurs and no lower extremity edema ABDOMEN:abdomen soft, non-tender and normal bowel sounds.  Feeding tube site looks okay Musculoskeletal:no cyanosis of digits and no clubbing  NEURO: alert & oriented x 3 with fluent speech, no focal motor/sensory deficits  LABORATORY DATA:  I have reviewed the data as listed    Component Value Date/Time   NA 140 03/30/2020 1249   K 4.5 03/30/2020 1249   CL 102 03/30/2020 1249   CO2 26 03/30/2020 1249   GLUCOSE 102 (H) 03/30/2020 1249   BUN 31 (H) 03/30/2020 1249   CREATININE 0.78 03/30/2020 1249   CREATININE 0.60 02/17/2020 1128   CALCIUM 9.0 03/30/2020 1249   PROT 7.3 03/30/2020 1249   ALBUMIN 3.9 03/30/2020 1249   AST 21 03/30/2020 1249   AST 22 02/17/2020 1128   ALT 27 03/30/2020 1249   ALT 24 02/17/2020 1128   ALKPHOS 86 03/30/2020 1249   BILITOT <0.2 (L) 03/30/2020 1249   BILITOT 0.5 02/17/2020 1128   GFRNONAA >60 03/30/2020 1249   GFRNONAA >60 02/17/2020 1128   GFRAA >60 03/30/2020 1249   GFRAA >60 02/17/2020 1128    No results found for: SPEP, UPEP  Lab Results  Component Value Date   WBC 4.9 03/30/2020   NEUTROABS 3.0 03/30/2020   HGB 12.7 03/30/2020   HCT 38.9 03/30/2020   MCV 85.5 03/30/2020   PLT 210 03/30/2020      Chemistry      Component Value Date/Time   NA 140 03/30/2020 1249   K 4.5 03/30/2020 1249   CL 102 03/30/2020 1249   CO2 26 03/30/2020 1249   BUN 31 (H) 03/30/2020 1249   CREATININE 0.78 03/30/2020 1249   CREATININE 0.60 02/17/2020 1128      Component Value Date/Time   CALCIUM 9.0 03/30/2020 1249   ALKPHOS 86 03/30/2020 1249   AST 21 03/30/2020 1249   AST 22 02/17/2020 1128   ALT 27 03/30/2020 1249   ALT 24 02/17/2020 1128   BILITOT <0.2 (L) 03/30/2020 1249   BILITOT 0.5 02/17/2020 1128

## 2020-04-07 ENCOUNTER — Other Ambulatory Visit: Payer: Self-pay | Admitting: Hematology and Oncology

## 2020-04-07 ENCOUNTER — Telehealth: Payer: Self-pay

## 2020-04-07 DIAGNOSIS — C329 Malignant neoplasm of larynx, unspecified: Secondary | ICD-10-CM

## 2020-04-07 NOTE — Telephone Encounter (Signed)
Placed call to [facility] for referral to Dr Constance Holster. Faxed referral to Dr Constance Holster office and fax confirmation received. Pt is scheduled w/ Dr Constance Holster 04/12/20 at 1500. Attempted to call pt's son, Rada Hay [phone], LVM to return call to office to inform him of appt.

## 2020-04-07 NOTE — Telephone Encounter (Signed)
Pt son called back and I explained to [name] [name] that his mother, the pt will see Dr Constance Holster at [facility] on 04/12/20 at 1500. Provided [name] [name] with address and telephone number to office. Verbalized thanks and understanding.

## 2020-04-10 ENCOUNTER — Other Ambulatory Visit: Payer: Self-pay

## 2020-04-10 ENCOUNTER — Ambulatory Visit (HOSPITAL_COMMUNITY)
Admission: RE | Admit: 2020-04-10 | Discharge: 2020-04-10 | Disposition: A | Discharge status: Home / Self Care | Payer: Medicare HMO | Source: Ambulatory Visit | Attending: Hematology and Oncology | Admitting: Hematology and Oncology

## 2020-04-10 ENCOUNTER — Ambulatory Visit (INDEPENDENT_AMBULATORY_CARE_PROVIDER_SITE_OTHER): Payer: Medicare HMO | Admitting: Otolaryngology

## 2020-04-10 DIAGNOSIS — C329 Malignant neoplasm of larynx, unspecified: Secondary | ICD-10-CM

## 2020-04-10 HISTORY — PX: IR RADIOLOGIST EVAL & MGMT: IMG5224

## 2020-04-10 NOTE — Progress Notes (Signed)
Oncology Nurse Navigator Documentation  I received a phone call from [name], Ms. Lemmerman's friend. She reported that Ms. Poland's PEG tube "has split" and they are unable to use it properly. I called Tiffany in IR and they have scheduled her to come in today at 12:00 for evaluation. I placed an order as requested by Tiffany. I called back and spoke to [facility] and she agreed to today's appointment time. She knows to call me if she has any further needs or concerns.   Harlow Asa RN, BSN, OCN Head & Neck Oncology Nurse [place] at [facility] Phone # [phone]  Fax # [phone]

## 2020-04-12 DIAGNOSIS — C329 Malignant neoplasm of larynx, unspecified: Secondary | ICD-10-CM | POA: Diagnosis not present

## 2020-04-20 DIAGNOSIS — Z430 Encounter for attention to tracheostomy: Secondary | ICD-10-CM | POA: Diagnosis not present

## 2020-04-27 ENCOUNTER — Encounter: Payer: Self-pay | Admitting: Hematology and Oncology

## 2020-04-27 ENCOUNTER — Encounter: Payer: Self-pay | Admitting: General Practice

## 2020-04-27 ENCOUNTER — Inpatient Hospital Stay: Payer: Medicare HMO

## 2020-04-27 ENCOUNTER — Other Ambulatory Visit: Payer: Self-pay

## 2020-04-27 ENCOUNTER — Ambulatory Visit: Payer: Medicare HMO | Admitting: Hematology and Oncology

## 2020-04-27 ENCOUNTER — Inpatient Hospital Stay (HOSPITAL_BASED_OUTPATIENT_CLINIC_OR_DEPARTMENT_OTHER): Payer: Medicare HMO | Admitting: Hematology and Oncology

## 2020-04-27 ENCOUNTER — Inpatient Hospital Stay: Payer: Medicare HMO | Attending: Hematology

## 2020-04-27 ENCOUNTER — Other Ambulatory Visit: Payer: Medicare HMO

## 2020-04-27 DIAGNOSIS — Z930 Tracheostomy status: Secondary | ICD-10-CM | POA: Insufficient documentation

## 2020-04-27 DIAGNOSIS — Z9221 Personal history of antineoplastic chemotherapy: Secondary | ICD-10-CM | POA: Diagnosis not present

## 2020-04-27 DIAGNOSIS — R633 Feeding difficulties: Secondary | ICD-10-CM | POA: Diagnosis not present

## 2020-04-27 DIAGNOSIS — C329 Malignant neoplasm of larynx, unspecified: Secondary | ICD-10-CM

## 2020-04-27 DIAGNOSIS — E039 Hypothyroidism, unspecified: Secondary | ICD-10-CM

## 2020-04-27 DIAGNOSIS — Z87891 Personal history of nicotine dependence: Secondary | ICD-10-CM | POA: Insufficient documentation

## 2020-04-27 DIAGNOSIS — C321 Malignant neoplasm of supraglottis: Secondary | ICD-10-CM | POA: Diagnosis not present

## 2020-04-27 DIAGNOSIS — G893 Neoplasm related pain (acute) (chronic): Secondary | ICD-10-CM | POA: Diagnosis not present

## 2020-04-27 DIAGNOSIS — Z95828 Presence of other vascular implants and grafts: Secondary | ICD-10-CM

## 2020-04-27 DIAGNOSIS — Z5112 Encounter for antineoplastic immunotherapy: Secondary | ICD-10-CM | POA: Insufficient documentation

## 2020-04-27 DIAGNOSIS — Z430 Encounter for attention to tracheostomy: Secondary | ICD-10-CM | POA: Diagnosis not present

## 2020-04-27 LAB — COMPREHENSIVE METABOLIC PANEL
ALT: 33 U/L (ref 0–44)
AST: 29 U/L (ref 15–41)
Albumin: 4 g/dL (ref 3.5–5.0)
Alkaline Phosphatase: 73 U/L (ref 38–126)
Anion gap: 9 (ref 5–15)
BUN: 26 mg/dL — ABNORMAL HIGH (ref 8–23)
CO2: 26 mmol/L (ref 22–32)
Calcium: 9.2 mg/dL (ref 8.9–10.3)
Chloride: 102 mmol/L (ref 98–111)
Creatinine, Ser: 0.73 mg/dL (ref 0.44–1.00)
GFR calc Af Amer: >60 mL/min (ref >60)
GFR calc non Af Amer: >60 mL/min (ref >60)
Glucose, Bld: 97 mg/dL (ref 70–99)
Potassium: 4.4 mmol/L (ref 3.5–5.1)
Sodium: 137 mmol/L (ref 135–145)
Total Bilirubin: 0.3 mg/dL (ref 0.3–1.2)
Total Protein: 7.3 g/dL (ref 6.5–8.1)

## 2020-04-27 LAB — CBC WITH DIFFERENTIAL/PLATELET
Abs Immature Granulocytes: 0.01 10*3/uL (ref 0.00–0.07)
Basophils Absolute: 0 10*3/uL (ref 0.0–0.1)
Basophils Relative: 0 %
Eosinophils Absolute: 0.2 10*3/uL (ref 0.0–0.5)
Eosinophils Relative: 4 %
HCT: 39.9 % (ref 36.0–46.0)
Hemoglobin: 13.2 g/dL (ref 12.0–15.0)
Immature Granulocytes: 0 %
Lymphocytes Relative: 22 %
Lymphs Abs: 1 10*3/uL (ref 0.7–4.0)
MCH: 28.4 pg (ref 26.0–34.0)
MCHC: 33.1 g/dL (ref 30.0–36.0)
MCV: 85.8 fL (ref 80.0–100.0)
Monocytes Absolute: 0.5 10*3/uL (ref 0.1–1.0)
Monocytes Relative: 11 %
Neutro Abs: 2.7 10*3/uL (ref 1.7–7.7)
Neutrophils Relative %: 63 %
Platelets: 172 10*3/uL (ref 150–400)
RBC: 4.65 MIL/uL (ref 3.87–5.11)
RDW: 14.7 % (ref 11.5–15.5)
WBC: 4.3 10*3/uL (ref 4.0–10.5)
nRBC: 0 % (ref 0.0–0.2)

## 2020-04-27 LAB — T4, FREE: Free T4: 1.05 ng/dL (ref 0.61–1.12)

## 2020-04-27 LAB — TSH: TSH: 1.269 u[IU]/mL (ref 0.308–3.960)

## 2020-04-27 MED ORDER — KATE FARMS PEPTIDE 1.5 PO LIQD: 4.0000 | Freq: Every day | ORAL | 11 refills | Status: DC

## 2020-04-27 MED ORDER — HEPARIN SOD (PORK) LOCK FLUSH 100 UNIT/ML IV SOLN
500.0000 [IU] | Freq: Once | INTRAVENOUS | Status: AC | PRN
  Administered 2020-04-27: 500 [IU]
  Filled 2020-04-27: qty 5

## 2020-04-27 MED ORDER — SODIUM CHLORIDE 0.9% FLUSH
10.0000 mL | Freq: Once | INTRAVENOUS | Status: DC
  Filled 2020-04-27: qty 10

## 2020-04-27 MED ORDER — SODIUM CHLORIDE 0.9 % IV SOLN
Freq: Once | INTRAVENOUS | Status: AC
  Filled 2020-04-27: qty 250

## 2020-04-27 MED ORDER — SODIUM CHLORIDE 0.9 % IV SOLN
200.0000 mg | Freq: Once | INTRAVENOUS | Status: AC
  Administered 2020-04-27: 200 mg via INTRAVENOUS
  Filled 2020-04-27: qty 8

## 2020-04-27 MED ORDER — HYDROCODONE-ACETAMINOPHEN 5-325 MG PO TABS: 1.0000 | ORAL_TABLET | Freq: Three times a day (TID) | ORAL | 0 refills | Status: DC | PRN

## 2020-04-27 MED ORDER — SODIUM CHLORIDE 0.9% FLUSH
10.0000 mL | INTRAVENOUS | Status: DC | PRN
  Administered 2020-04-27: 10 mL
  Filled 2020-04-27: qty 10

## 2020-04-27 NOTE — Progress Notes (Signed)
[facility] OFFICE PROGRESS NOTE  Patient Care Team: Hamrick, Lorin Mercy, MD as PCP - General (Family Medicine) Eppie Gibson, MD as Attending Physician (Radiation Oncology) Tish Men, MD (Inactive) as Consulting Physician (Hematology) Leota Sauers, RN (Inactive) as Oncology Nurse Navigator Karie Mainland, RD as Dietitian (Nutrition) Sharen Counter, CCC-SLP as Speech Language Pathologist (Speech Pathology) Malmfelt, Stephani Police, RN as Oncology Nurse Navigator (Oncology)  ASSESSMENT & PLAN:  Laryngeal squamous cell carcinoma [facility]) She have excellent response to therapy I recommend we continue single agent pembrolizumab every 3 weeks for a few more months before repeating another imaging study, probably around September So far, she has no complications or side effects from treatment  Hypothyroidism This is likely due to prior radiation exposure We will continue to check her thyroid level and adjust medicine as needed  Cancer associated pain Her pain is well controlled We discussed narcotic refill policy I refill her prescription today   No orders of the defined types were placed in this encounter.   All questions were answered. The patient knows to call the clinic with any problems, questions or concerns. The total time spent in the appointment was 20 minutes encounter with patients including review of chart and various tests results, discussions about plan of care and coordination of care plan   Heath Lark, MD 04/27/2020 10:39 AM  INTERVAL HISTORY: Please see below for problem oriented charting. She returns with her son today for treatment and follow-up She is doing well Her feeding tube is working well now Her pain is well controlled Her nutritional supplements and pain medicine needs refill which I had accomplished No side effects from treatment so far No recent infection, fever or chills No infusion reactions  SUMMARY OF ONCOLOGIC HISTORY: Oncology  History  Laryngeal squamous cell carcinoma ([place])  12/02/2018 Imaging   CT neck: IMPRESSION: 1. Supraglottic laryngeal carcinoma extending across the anterior commissure and invading the prelaryngeal fat, left aryepiglottic fold and left aspect of the epiglottis. 2. Severe narrowing of the laryngeal airway. 3. Bilateral subcentimeter level 2A and 2A cervical lymph nodes.     12/02/2018 Imaging   CT  Chest:  IMPRESSION: No evidence of metastatic disease in the chest.   12/03/2018 Pathology Results   (Care Everywhere; [facility]) A: Larynx, supraglottis, biopsy - Atypical squamous proliferation with at least low grade dysplasia and abundant keratinization - No definite high grade dysplasia or invasive carcinoma represented (see comment)   12/18/2018 Pathology Results   (Care Everywhere; [facility] A:  Neck, left, fine needle aspiration - Rare single atypical cells, no cells diagnostic of malignancy - Lymph node material present  B:  Neck, right, fine needle aspiration - Non-diagnostic specimen - No lymph node material identified - Mixed inflammation and blood   12/31/2018 Pathology Results   (Care Everywhere; [facility])  A: Larynx, left supraglottic mass, biopsy - Atypical squamous proliferation with abundant keratinization, suspicious for well-differentiated invasive squamous cell carcinoma  B: Larynx, left supraglottic mass, biopsy - Well-differentiated invasive squamous cell carcinoma with abundant keratinization (see comment)   01/18/2019 Initial Diagnosis   Laryngeal cancer ([place])   01/26/2019 Imaging   1. Locally advanced hypermetabolic laryngeal neoplasm centered in the left supraglottic space with bilateral glottic and epiglottic involvement and subglottic extension on the left. 2. Hypermetabolic bilateral level 3 neck nodal metastases. 3. Hypermetabolic high mediastinal nodal metastases between the trachea and upper thoracic esophagus. 4. Otherwise no distant  hypermetabolic metastatic disease. 5. Nodular 7 mm  focus at the left lung base, below PET resolution, recommend attention on follow-up chest CT in 3 months. 6. Chronic findings include: Aortic Atherosclerosis (ICD10-I70.0). Cholelithiasis.   02/08/2019 Cancer Staging   Staging form: Larynx - Supraglottis, AJCC 8th Edition - Clinical stage from 02/08/2019: Stage IVA (cT3, cN2c, cM0) - Signed by Eppie Gibson, MD on 02/08/2019   02/11/2019 - 04/07/2019 Chemotherapy   The patient weekly cisplatin for chemotherapy treatment.     07/20/2019 Pathology Results   PROCEDURE: Total glossectomy, total pharyngectomy, total laryngectomy, total thyroidectomy, and cervical esophagectomy TUMOR SITE: Larynx TUMOR LATERALITY: Not specified TUMOR FOCALITY: Unifocal TUMOR SIZE:    GREATEST DIMENSION: 6.9 cm    ADDITIONAL DIMENSIONS: 5.4 x 2.4 cm HISTOLOGIC TYPE: Squamous cell carcinoma, conventional (keratinizing) HISTOLOGIC GRADE:  G1-G2: Well to moderately differentiated TUMOR EXTENSION: Tumor involves the posterior tongue, pharynx, larynx, tracheal cartilage, hyoid bone, cervical esophagus, vagus nerve, carotid artery, deep cervical fascia, ans surrounding skeletal muscle and soft tissue MARGINS: Uninvolved by invasive tumor; Perineural invasion is less than 100mm from the left soft tissue margin LYMPHOVASCULAR INVASION: Not identified PERINEURAL INVASION:  Present REGIONAL LYMPH NODES:     NUMBER OF LYMPH NODES INVOLVED: 2     NUMBER OF LYMPH NODES EXAMINED: 24     LATERALITY OF LYMPH NODES INVOLVED: Cannot determine     SIZE OF LARGEST METASTATIC DEPOSIT: 0.8 cm     EXTRANODAL EXTENSION: Not identified PATHOLOGIC STAGE CLASSIFICATION (pTNM, AJCC 8TH Ed): (r)(y) pT4b pN2a DISTANT METASTASIS (pM): ADDITIONAL PATHOLOGIC FINDINGS: Previous chemoradiation   11/04/2019 Imaging   PET: 1.  Hypermetabolic bilateral neck lesions as detailed above are concerning for disease recurrence. 2.  Compared to  07/08/2019 PET scan, interval development of subcentimeter bilateral upper lobe pulmonary nodules, size below PET resolution. These are concerning for metastasis. Consider diagnostic quality CT chest exam for further evaluation as per oncology treatment planning. 3.  Focal hypermetabolic uptake in right ventricular cavity of indeterminate significance. Recommend echocardiographic examination to rule out underlying thrombus. This finding was discussed with Dr. Conley Canal by Dr. Clementeen Graham and Dr. Marcello Moores on 11/04/2019 at approximately 3:00 PM via telephone. 4.  Ancillary CT findings as above.   11/05/2019 Imaging   Echocardiogram: Summary   1. The left ventricle is normal in size with normal wall thickness.   2. The left ventricular systolic function is normal, LVEF is visually estimated at 60-65%.   3. The right ventricle is normal in size, with normal systolic function.   4. There is an extrinsic compression of the RV and RA laterally at the AV groove by a mass of 4.3 cm diameter. Unclear if the mass is invasive, although no pericardial effusion seen suggestive no invasion possibly. There is no evidence of hemodynamic compromise secondary to cardiac compression with normal IVC diameter.   12/06/2019 Pathology Results   A. LYMPH NODE, LEFT SUBMANDIBULAR, BIOPSY:  - Squamous cell carcinoma.  - No distinct nodal tissue identified.    12/06/2019 Procedure   Successful ultrasound-guided core biopsy of centrally necrotic left submandibular lymph node. Of note, due to extensive prior postsurgical and post radiation changes, patient has very limited mobility in the neck and the biopsy was extremely challenging    12/16/2019 -  Chemotherapy   The patient had pembrolizumab for chemotherapy treatment.     03/08/2020 PET scan   1. Diminished size of masses in the neck as described. Persistent increased metabolic activity particularly in the LEFT neck. Some of this diminished size on the LEFT could  be due to  decompression of necrotic material. Correlate with any symptoms of inflammation or infection in this area as well given the paucity of residual soft tissue associated with the above abnormality, overall findings are compatible with residual disease in this location and there is no significant surrounding stranding to indicate inflammation that would correlate with the degree of FDG uptake remaining. 2. Diminished activity in the juxta cardiac mass with peripheral activity suggesting interval necrosis, on the prior study this was uniformly hypermetabolic. There is still considerable FDG uptake. 3. No signs of disease in the abdomen or pelvis. 4. 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 Correlate with any new pain or symptoms in this area with further imaging as warranted. No discrete mass or bone destruction on today's study.     REVIEW OF SYSTEMS:   Constitutional: Denies fevers, chills or abnormal weight loss Eyes: Denies blurriness of vision Ears, nose, mouth, throat, and face: Denies mucositis or sore throat Respiratory: Denies cough, dyspnea or wheezes Cardiovascular: Denies palpitation, chest discomfort or lower extremity swelling Gastrointestinal:  Denies nausea, heartburn or change in bowel habits Skin: Denies abnormal skin rashes Lymphatics: Denies new lymphadenopathy or easy bruising Neurological:Denies numbness, tingling or new weaknesses Behavioral/Psych: Mood is stable, no new changes  All other systems were reviewed with the patient and are negative.  I have reviewed the past medical history, past surgical history, social history and family history with the patient and they are unchanged from previous note.  ALLERGIES:  has No Known Allergies.  MEDICATIONS:  Current Outpatient Medications  Medication Sig Dispense Refill  . chlorhexidine (PERIDEX) 0.12 % solution Rinse with 15 mls twice daily for 30 seconds. Use after breakfast and at bedtime. Spit out excess. Do not  swallow. 480 mL 3  . HYDROcodone-acetaminophen (NORCO) 5-325 MG tablet Take 1 tablet by mouth every 8 (eight) hours as needed for moderate pain. 60 tablet 0  . levothyroxine (SYNTHROID) 137 MCG tablet Take 137 mcg by mouth daily before breakfast.    . lidocaine-prilocaine (EMLA) cream Apply to affected area once 30 g 3  . Nutritional Supplements (KATE FARMS PEPTIDE 1.5) LIQD Take 4 Bottles by mouth daily. 325 mL 11  . ondansetron (ZOFRAN) 8 MG tablet Take 1 tablet (8 mg total) by mouth 2 (two) times daily as needed (Nausea or vomiting). 30 tablet 1  . prochlorperazine (COMPAZINE) 10 MG tablet Take 1 tablet (10 mg total) by mouth every 6 (six) hours as needed (Nausea or vomiting). 30 tablet 1  . sodium fluoride (PREVIDENT 5000 PLUS) 1.1 % CREA dental cream Apply cream to tooth brush. Brush teeth for 2 minutes. Spit out excess. DO NOT rinse afterwards. Repeat nightly. (Patient taking differently: Place 1 application onto teeth 2 (two) times a day. ) 1 Tube prn   No current facility-administered medications for this visit.    PHYSICAL EXAMINATION: ECOG PERFORMANCE STATUS: 1 - Symptomatic but completely ambulatory  Vitals:   04/27/20 1029  BP: (!) 103/56  Pulse: 77  Resp: 14  Temp: (!) 97.5 F (36.4 C)  SpO2: 100%   Filed Weights   04/27/20 1029  Weight: 133 lb 9.6 oz (60.6 kg)    GENERAL:alert, no distress and comfortable SKIN: skin color, texture, turgor are normal, no rashes or significant lesions EYES: normal, Conjunctiva are pink and non-injected, sclera clear OROPHARYNX:no exudate, no erythema and lips, buccal mucosa, and tongue normal  NECK: Tracheostomy site looks okay LYMPH:  no palpable lymphadenopathy in the cervical, axillary or inguinal LUNGS:  clear to auscultation and percussion with normal breathing effort HEART: regular rate & rhythm and no murmurs and no lower extremity edema ABDOMEN:abdomen soft, non-tender and normal bowel sounds.  Feeding tube site looks  okay Musculoskeletal:no cyanosis of digits and no clubbing   LABORATORY DATA:  I have reviewed the data as listed    Component Value Date/Time   NA 140 03/30/2020 1249   K 4.5 03/30/2020 1249   CL 102 03/30/2020 1249   CO2 26 03/30/2020 1249   GLUCOSE 102 (H) 03/30/2020 1249   BUN 31 (H) 03/30/2020 1249   CREATININE 0.78 03/30/2020 1249   CREATININE 0.60 02/17/2020 1128   CALCIUM 9.0 03/30/2020 1249   PROT 7.3 03/30/2020 1249   ALBUMIN 3.9 03/30/2020 1249   AST 21 03/30/2020 1249   AST 22 02/17/2020 1128   ALT 27 03/30/2020 1249   ALT 24 02/17/2020 1128   ALKPHOS 86 03/30/2020 1249   BILITOT <0.2 (L) 03/30/2020 1249   BILITOT 0.5 02/17/2020 1128   GFRNONAA >60 03/30/2020 1249   GFRNONAA >60 02/17/2020 1128   GFRAA >60 03/30/2020 1249   GFRAA >60 02/17/2020 1128    No results found for: SPEP, UPEP  Lab Results  Component Value Date   WBC 4.3 04/27/2020   NEUTROABS 2.7 04/27/2020   HGB 13.2 04/27/2020   HCT 39.9 04/27/2020   MCV 85.8 04/27/2020   PLT 172 04/27/2020      Chemistry      Component Value Date/Time   NA 140 03/30/2020 1249   K 4.5 03/30/2020 1249   CL 102 03/30/2020 1249   CO2 26 03/30/2020 1249   BUN 31 (H) 03/30/2020 1249   CREATININE 0.78 03/30/2020 1249   CREATININE 0.60 02/17/2020 1128      Component Value Date/Time   CALCIUM 9.0 03/30/2020 1249   ALKPHOS 86 03/30/2020 1249   AST 21 03/30/2020 1249   AST 22 02/17/2020 1128   ALT 27 03/30/2020 1249   ALT 24 02/17/2020 1128   BILITOT <0.2 (L) 03/30/2020 1249   BILITOT 0.5 02/17/2020 1128

## 2020-04-27 NOTE — Assessment & Plan Note (Signed)
Her pain is well controlled We discussed narcotic refill policy I refill her prescription today

## 2020-04-27 NOTE — Patient Instructions (Signed)

## 2020-04-27 NOTE — Progress Notes (Signed)
[facility] CSW Progress Notes  Enrolled in [place] for help w food/gas, provided first of 4 disbursements.  Edwyna Shell, LCSW Clinical Social Worker Phone:  [phone]

## 2020-04-27 NOTE — Assessment & Plan Note (Addendum)
This is likely due to prior radiation exposure We will continue to check her thyroid level and adjust medicine as needed

## 2020-04-27 NOTE — Patient Instructions (Signed)
[facility] Discharge Instructions for Patients Receiving Chemotherapy  Today you received the following chemotherapy agents :  Keytruda.  To help prevent nausea and vomiting after your treatment, we encourage you to take your nausea medication as prescribed.   If you develop nausea and vomiting that is not controlled by your nausea medication, call the clinic.   BELOW ARE SYMPTOMS THAT SHOULD BE REPORTED IMMEDIATELY:  *FEVER GREATER THAN 100.5 F  *CHILLS WITH OR WITHOUT FEVER  NAUSEA AND VOMITING THAT IS NOT CONTROLLED WITH YOUR NAUSEA MEDICATION  *UNUSUAL SHORTNESS OF BREATH  *UNUSUAL BRUISING OR BLEEDING  TENDERNESS IN MOUTH AND THROAT WITH OR WITHOUT PRESENCE OF ULCERS  *URINARY PROBLEMS  *BOWEL PROBLEMS  UNUSUAL RASH Items with * indicate a potential emergency and should be followed up as soon as possible.  Feel free to call the clinic should you have any questions or concerns. The clinic phone number is [phone].  Please show the CHEMO ALERT CARD at check-in to the Emergency Department and triage nurse.  

## 2020-04-27 NOTE — Assessment & Plan Note (Signed)
She have excellent response to therapy I recommend we continue single agent pembrolizumab every 3 weeks for a few more months before repeating another imaging study, probably around September So far, she has no complications or side effects from treatment

## 2020-04-28 ENCOUNTER — Telehealth: Payer: Self-pay | Admitting: Hematology and Oncology

## 2020-04-28 ENCOUNTER — Telehealth: Payer: Self-pay

## 2020-04-28 NOTE — Telephone Encounter (Signed)
No 8/5 los. No changes made to pt's schedule

## 2020-04-28 NOTE — Telephone Encounter (Signed)
Faxed Nutritional Supplement Rx to [facility] at [phone]. Received confirmation.

## 2020-05-03 DIAGNOSIS — C321 Malignant neoplasm of supraglottis: Secondary | ICD-10-CM | POA: Diagnosis not present

## 2020-05-03 DIAGNOSIS — R633 Feeding difficulties: Secondary | ICD-10-CM | POA: Diagnosis not present

## 2020-05-05 DIAGNOSIS — E039 Hypothyroidism, unspecified: Secondary | ICD-10-CM | POA: Diagnosis not present

## 2020-05-05 DIAGNOSIS — C329 Malignant neoplasm of larynx, unspecified: Secondary | ICD-10-CM | POA: Diagnosis not present

## 2020-05-05 DIAGNOSIS — E892 Postprocedural hypoparathyroidism: Secondary | ICD-10-CM | POA: Diagnosis not present

## 2020-05-05 DIAGNOSIS — Z930 Tracheostomy status: Secondary | ICD-10-CM | POA: Diagnosis not present

## 2020-05-05 DIAGNOSIS — Z6824 Body mass index (BMI) 24.0-24.9, adult: Secondary | ICD-10-CM | POA: Diagnosis not present

## 2020-05-05 DIAGNOSIS — Z978 Presence of other specified devices: Secondary | ICD-10-CM | POA: Diagnosis not present

## 2020-05-18 ENCOUNTER — Encounter: Payer: Self-pay | Admitting: Hematology and Oncology

## 2020-05-18 ENCOUNTER — Inpatient Hospital Stay: Payer: Medicare HMO | Admitting: Nutrition

## 2020-05-18 ENCOUNTER — Other Ambulatory Visit: Payer: Self-pay

## 2020-05-18 ENCOUNTER — Inpatient Hospital Stay: Payer: Medicare HMO

## 2020-05-18 ENCOUNTER — Inpatient Hospital Stay (HOSPITAL_BASED_OUTPATIENT_CLINIC_OR_DEPARTMENT_OTHER): Payer: Medicare HMO | Admitting: Hematology and Oncology

## 2020-05-18 VITALS — BP 104/66 | HR 79 | Temp 97.3°F | Resp 18 | Ht 62.0 in | Wt 135.4 lb

## 2020-05-18 DIAGNOSIS — K089 Disorder of teeth and supporting structures, unspecified: Secondary | ICD-10-CM | POA: Diagnosis not present

## 2020-05-18 DIAGNOSIS — C321 Malignant neoplasm of supraglottis: Secondary | ICD-10-CM

## 2020-05-18 DIAGNOSIS — B370 Candidal stomatitis: Secondary | ICD-10-CM | POA: Diagnosis not present

## 2020-05-18 DIAGNOSIS — Z95828 Presence of other vascular implants and grafts: Secondary | ICD-10-CM

## 2020-05-18 DIAGNOSIS — L598 Other specified disorders of the skin and subcutaneous tissue related to radiation: Secondary | ICD-10-CM

## 2020-05-18 DIAGNOSIS — C329 Malignant neoplasm of larynx, unspecified: Secondary | ICD-10-CM

## 2020-05-18 DIAGNOSIS — G893 Neoplasm related pain (acute) (chronic): Secondary | ICD-10-CM | POA: Diagnosis not present

## 2020-05-18 DIAGNOSIS — Z5112 Encounter for antineoplastic immunotherapy: Secondary | ICD-10-CM | POA: Diagnosis not present

## 2020-05-18 DIAGNOSIS — Z299 Encounter for prophylactic measures, unspecified: Secondary | ICD-10-CM

## 2020-05-18 DIAGNOSIS — E039 Hypothyroidism, unspecified: Secondary | ICD-10-CM

## 2020-05-18 LAB — CBC WITH DIFFERENTIAL/PLATELET
Abs Immature Granulocytes: 0.03 10*3/uL (ref 0.00–0.07)
Basophils Absolute: 0 10*3/uL (ref 0.0–0.1)
Basophils Relative: 0 %
Eosinophils Absolute: 0.2 10*3/uL (ref 0.0–0.5)
Eosinophils Relative: 4 %
HCT: 39.5 % (ref 36.0–46.0)
Hemoglobin: 13 g/dL (ref 12.0–15.0)
Immature Granulocytes: 1 %
Lymphocytes Relative: 25 %
Lymphs Abs: 1.2 10*3/uL (ref 0.7–4.0)
MCH: 28.1 pg (ref 26.0–34.0)
MCHC: 32.9 g/dL (ref 30.0–36.0)
MCV: 85.3 fL (ref 80.0–100.0)
Monocytes Absolute: 0.6 10*3/uL (ref 0.1–1.0)
Monocytes Relative: 12 %
Neutro Abs: 2.8 10*3/uL (ref 1.7–7.7)
Neutrophils Relative %: 58 %
Platelets: 205 10*3/uL (ref 150–400)
RBC: 4.63 MIL/uL (ref 3.87–5.11)
RDW: 14.4 % (ref 11.5–15.5)
WBC: 4.8 10*3/uL (ref 4.0–10.5)
nRBC: 0 % (ref 0.0–0.2)

## 2020-05-18 LAB — COMPREHENSIVE METABOLIC PANEL
ALT: 31 U/L (ref 0–44)
AST: 25 U/L (ref 15–41)
Albumin: 3.9 g/dL (ref 3.5–5.0)
Alkaline Phosphatase: 79 U/L (ref 38–126)
Anion gap: 8 (ref 5–15)
BUN: 27 mg/dL — ABNORMAL HIGH (ref 8–23)
CO2: 29 mmol/L (ref 22–32)
Calcium: 9.6 mg/dL (ref 8.9–10.3)
Chloride: 101 mmol/L (ref 98–111)
Creatinine, Ser: 0.75 mg/dL (ref 0.44–1.00)
GFR calc Af Amer: >60 mL/min (ref >60)
GFR calc non Af Amer: >60 mL/min (ref >60)
Glucose, Bld: 97 mg/dL (ref 70–99)
Potassium: 4.4 mmol/L (ref 3.5–5.1)
Sodium: 138 mmol/L (ref 135–145)
Total Bilirubin: 0.3 mg/dL (ref 0.3–1.2)
Total Protein: 7.4 g/dL (ref 6.5–8.1)

## 2020-05-18 LAB — T4, FREE: Free T4: 1.02 ng/dL (ref 0.61–1.12)

## 2020-05-18 LAB — TSH: TSH: 2.345 u[IU]/mL (ref 0.308–3.960)

## 2020-05-18 MED ORDER — SODIUM CHLORIDE 0.9% FLUSH
10.0000 mL | Freq: Once | INTRAVENOUS | Status: AC
  Administered 2020-05-18: 10 mL
  Filled 2020-05-18: qty 10

## 2020-05-18 MED ORDER — HEPARIN SOD (PORK) LOCK FLUSH 100 UNIT/ML IV SOLN
500.0000 [IU] | Freq: Once | INTRAVENOUS | Status: AC | PRN
  Administered 2020-05-18: 500 [IU]
  Filled 2020-05-18: qty 5

## 2020-05-18 MED ORDER — SODIUM CHLORIDE 0.9 % IV SOLN
200.0000 mg | Freq: Once | INTRAVENOUS | Status: AC
  Administered 2020-05-18: 200 mg via INTRAVENOUS
  Filled 2020-05-18: qty 8

## 2020-05-18 MED ORDER — SODIUM CHLORIDE 0.9 % IV SOLN
Freq: Once | INTRAVENOUS | Status: AC
  Filled 2020-05-18: qty 250

## 2020-05-18 MED ORDER — SODIUM CHLORIDE 0.9% FLUSH
10.0000 mL | INTRAVENOUS | Status: DC | PRN
  Administered 2020-05-18: 10 mL
  Filled 2020-05-18: qty 10

## 2020-05-18 MED ORDER — HYDROCODONE-ACETAMINOPHEN 5-325 MG PO TABS: 1.0000 | ORAL_TABLET | Freq: Three times a day (TID) | ORAL | 0 refills | Status: DC | PRN

## 2020-05-18 MED ORDER — FLUCONAZOLE 100 MG PO TABS: 100.0000 mg | ORAL_TABLET | Freq: Every day | ORAL | 0 refills | Status: DC

## 2020-05-18 NOTE — Assessment & Plan Note (Signed)
She has multiple complications from recent treatment She have oral thrush She has dental pain She have significant signs of neck fibrosis from prior radiation therapy We will proceed with treatment as scheduled I plan to order repeat imaging study before her next treatment We will continue aggressive supportive care

## 2020-05-18 NOTE — Assessment & Plan Note (Signed)
We discussed the importance of preventive care and reviewed the vaccination programs. She does not have any prior allergic reactions to Covid-19 vaccination.  She has received the Moderna vaccines several months ago I recommend she proceed to get booster vaccine in her nearby pharmacy

## 2020-05-18 NOTE — Patient Instructions (Signed)
[facility] Discharge Instructions for Patients Receiving Chemotherapy  Today you received the following chemotherapy agents:  Keytruda.  To help prevent nausea and vomiting after your treatment, we encourage you to take your nausea medication as directed.   If you develop nausea and vomiting that is not controlled by your nausea medication, call the clinic.   BELOW ARE SYMPTOMS THAT SHOULD BE REPORTED IMMEDIATELY:  *FEVER GREATER THAN 100.5 F  *CHILLS WITH OR WITHOUT FEVER  NAUSEA AND VOMITING THAT IS NOT CONTROLLED WITH YOUR NAUSEA MEDICATION  *UNUSUAL SHORTNESS OF BREATH  *UNUSUAL BRUISING OR BLEEDING  TENDERNESS IN MOUTH AND THROAT WITH OR WITHOUT PRESENCE OF ULCERS  *URINARY PROBLEMS  *BOWEL PROBLEMS  UNUSUAL RASH Items with * indicate a potential emergency and should be followed up as soon as possible.  Feel free to call the clinic should you have any questions or concerns. The clinic phone number is [phone].  Please show the CHEMO ALERT CARD at check-in to the Emergency Department and triage nurse.    

## 2020-05-18 NOTE — Patient Instructions (Signed)

## 2020-05-18 NOTE — Assessment & Plan Note (Signed)
Her pain is well controlled We discussed narcotic refill policy I refill her prescription today

## 2020-05-18 NOTE — Assessment & Plan Note (Signed)
She has very poor dentition, likely exacerbated from prior radiation therapy I will refer her back to dental medicine again for further follow-up and treatment as necessary

## 2020-05-18 NOTE — Progress Notes (Signed)
[place] OFFICE PROGRESS NOTE  Patient Care Team: Hamrick, Lorin Mercy, MD as PCP - General (Family Medicine) Eppie Gibson, MD as Attending Physician (Radiation Oncology) Leota Sauers, RN (Inactive) as Oncology Nurse Navigator Karie Mainland, RD as Dietitian (Nutrition) Sharen Counter, CCC-SLP as Speech Language Pathologist (Speech Pathology) Malmfelt, Stephani Police, RN as Oncology Nurse Navigator (Oncology)  ASSESSMENT & PLAN:  Laryngeal squamous cell carcinoma [facility]) She has multiple complications from recent treatment She have oral thrush She has dental pain She have significant signs of neck fibrosis from prior radiation therapy We will proceed with treatment as scheduled I plan to order repeat imaging study before her next treatment We will continue aggressive supportive care  Oral thrush She has new oral thrush which is not unexpected from side effects of recent treatment I recommend a course of fluconazole  Poor dentition She has very poor dentition, likely exacerbated from prior radiation therapy I will refer her back to dental medicine again for further follow-up and treatment as necessary  Cancer associated pain Her pain is well controlled We discussed narcotic refill policy I refill her prescription today  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 She does not have any prior allergic reactions to Covid-19 vaccination.  She has received the Moderna vaccines several months ago I recommend she proceed to get booster vaccine in her nearby pharmacy   Orders Placed This Encounter  Procedures  . NM PET Image Restage (PS) Skull Base to Thigh     Standing Status:   Future    Standing Expiration Date:   05/18/2021    Order Specific Question:   If indicated for the ordered procedure, I authorize the administration of a radiopharmaceutical per Radiology protocol    Answer:   Yes    Order Specific Question:   Preferred imaging location?    Answer:   [facility]    Order Specific Question:   Radiology Contrast Protocol - do NOT remove file path    Answer:   [email or web address]  . Ambulatory referral to Physical Therapy    Referral Priority:   Routine    Referral Type:   Physical Medicine    Referral Reason:   Specialty Services Required    Requested Specialty:   Physical Therapy    Number of Visits Requested:   1    All questions were answered. The patient knows to call the clinic with any problems, questions or concerns. The total time spent in the appointment was 40 minutes encounter with patients including review of chart and various tests results, discussions about plan of care and coordination of care plan   Heath Lark, MD 05/18/2020 9:19 AM  INTERVAL HISTORY: Please see below for problem oriented charting. She returns with her son for further follow-up Since last time I saw her, she has not been feeling well She had a skin rash on the left shoulder but that has almost completely resolved She has significant neck pain and left shoulder pain She also complained of significant pain inside her mouth Denies recent nausea vomiting No recent fever or chills Her nutritional intake through her feeding tube is adequate Her pain is well controlled with current prescribed pain medicine  SUMMARY OF ONCOLOGIC HISTORY: Oncology History  Laryngeal squamous cell carcinoma ([place])  12/02/2018 Imaging   CT neck: IMPRESSION: 1. Supraglottic laryngeal carcinoma extending across the anterior commissure and invading the prelaryngeal fat, left aryepiglottic fold and left aspect of the epiglottis. 2. Severe  narrowing of the laryngeal airway. 3. Bilateral subcentimeter level 2A and 2A cervical lymph nodes.     12/02/2018 Imaging   CT  Chest:  IMPRESSION: No evidence of metastatic disease in the chest.   12/03/2018 Pathology Results   (Care Everywhere; [facility]) A: Larynx, supraglottis, biopsy - Atypical squamous proliferation with at least low grade dysplasia and abundant keratinization - No definite high grade dysplasia or invasive carcinoma represented (see comment)   12/18/2018 Pathology Results   (Care Everywhere; [facility]) A:  Neck, left, fine needle aspiration - Rare single atypical cells, no cells diagnostic of malignancy - Lymph node material present  B:  Neck, right, fine needle aspiration - Non-diagnostic specimen - No lymph node material identified - Mixed inflammation and blood   12/31/2018 Pathology Results   (Care Everywhere; [facility])  A: Larynx, left supraglottic mass, biopsy - Atypical squamous proliferation with abundant keratinization, suspicious for well-differentiated invasive squamous cell carcinoma  B: Larynx, left supraglottic mass, biopsy - Well-differentiated invasive squamous cell carcinoma with abundant keratinization (see comment)   01/18/2019 Initial Diagnosis   Laryngeal cancer ([place])   01/26/2019 Imaging   1. Locally advanced hypermetabolic laryngeal neoplasm centered in the left supraglottic space with bilateral glottic and epiglottic involvement and subglottic extension on the left. 2. Hypermetabolic bilateral level 3 neck nodal metastases. 3. Hypermetabolic high mediastinal nodal metastases between the trachea and upper thoracic esophagus. 4. Otherwise no distant hypermetabolic metastatic disease. 5. Nodular 7 mm focus at the left lung base, below PET resolution, recommend attention on follow-up chest CT in 3 months. 6. Chronic findings include: Aortic Atherosclerosis (ICD10-I70.0). Cholelithiasis.   02/08/2019 Cancer Staging   Staging  form: Larynx - Supraglottis, AJCC 8th Edition - Clinical stage from 02/08/2019: Stage IVA (cT3, cN2c, cM0) - Signed by Eppie Gibson, MD on 02/08/2019   02/11/2019 - 04/07/2019 Chemotherapy   The patient weekly cisplatin for chemotherapy treatment.     07/20/2019 Pathology Results   PROCEDURE: Total glossectomy, total pharyngectomy, total laryngectomy, total thyroidectomy, and cervical esophagectomy TUMOR SITE: Larynx TUMOR LATERALITY: Not specified TUMOR FOCALITY: Unifocal TUMOR SIZE:    GREATEST DIMENSION: 6.9 cm    ADDITIONAL DIMENSIONS: 5.4 x 2.4 cm HISTOLOGIC TYPE: Squamous cell carcinoma, conventional (keratinizing) HISTOLOGIC GRADE:  G1-G2: Well to moderately differentiated TUMOR EXTENSION: Tumor involves the posterior tongue, pharynx, larynx, tracheal cartilage, hyoid bone, cervical esophagus, vagus nerve, carotid artery, deep cervical fascia, ans surrounding skeletal muscle and soft tissue MARGINS: Uninvolved by invasive tumor; Perineural invasion is less than 21mm from the left soft tissue margin LYMPHOVASCULAR INVASION: Not identified PERINEURAL INVASION:  Present REGIONAL LYMPH NODES:     NUMBER OF LYMPH NODES INVOLVED: 2     NUMBER OF LYMPH NODES EXAMINED: 24     LATERALITY OF LYMPH NODES INVOLVED: Cannot determine     SIZE OF LARGEST METASTATIC DEPOSIT: 0.8 cm     EXTRANODAL EXTENSION: Not identified PATHOLOGIC STAGE CLASSIFICATION (pTNM, AJCC 8TH Ed): (r)(y) pT4b pN2a DISTANT METASTASIS (pM): ADDITIONAL PATHOLOGIC FINDINGS: Previous chemoradiation   11/04/2019 Imaging   PET: 1.  Hypermetabolic bilateral neck lesions as detailed above are concerning for disease recurrence. 2.  Compared to 07/08/2019 PET scan, interval development of subcentimeter bilateral upper lobe pulmonary nodules,  size below PET resolution. These are concerning for metastasis. Consider diagnostic quality CT chest exam for further evaluation as per oncology treatment planning. 3.  Focal  hypermetabolic uptake in right ventricular cavity of indeterminate significance. Recommend echocardiographic examination to rule out underlying thrombus. This finding was discussed with Dr. Conley Canal by Dr. Clementeen Graham and Dr. Marcello Moores on 11/04/2019 at approximately 3:00 PM via telephone. 4.  Ancillary CT findings as above.   11/05/2019 Imaging   Echocardiogram: Summary   1. The left ventricle is normal in size with normal wall thickness.   2. The left ventricular systolic function is normal, LVEF is visually estimated at 60-65%.   3. The right ventricle is normal in size, with normal systolic function.   4. There is an extrinsic compression of the RV and RA laterally at the AV groove by a mass of 4.3 cm diameter. Unclear if the mass is invasive, although no pericardial effusion seen suggestive no invasion possibly. There is no evidence of hemodynamic compromise secondary to cardiac compression with normal IVC diameter.   12/06/2019 Pathology Results   A. LYMPH NODE, LEFT SUBMANDIBULAR, BIOPSY:  - Squamous cell carcinoma.  - No distinct nodal tissue identified.    12/06/2019 Procedure   Successful ultrasound-guided core biopsy of centrally necrotic left submandibular lymph node. Of note, due to extensive prior postsurgical and post radiation changes, patient has very limited mobility in the neck and the biopsy was extremely challenging    12/16/2019 -  Chemotherapy   The patient had pembrolizumab for chemotherapy treatment.     03/08/2020 PET scan   1. Diminished size of masses in the neck as described. Persistent increased metabolic activity particularly in the LEFT neck. Some of this diminished size on the LEFT could be due to decompression of necrotic material. Correlate with any symptoms of inflammation or infection in this area as well given the paucity of residual soft tissue associated with the above abnormality, overall findings are compatible with residual disease in this location and there is  no significant surrounding stranding to indicate inflammation that would correlate with the degree of FDG uptake remaining. 2. Diminished activity in the juxta cardiac mass with peripheral activity suggesting interval necrosis, on the prior study this was uniformly hypermetabolic. There is still considerable FDG uptake. 3. No signs of disease in the abdomen or pelvis. 4. 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 Correlate with any new pain or symptoms in this area with further imaging as warranted. No discrete mass or bone destruction on today's study.     REVIEW OF SYSTEMS:   Constitutional: Denies fevers, chills or abnormal weight loss Eyes: Denies blurriness of vision Respiratory: Denies cough, dyspnea or wheezes Cardiovascular: Denies palpitation, chest discomfort or lower extremity swelling Gastrointestinal:  Denies nausea, heartburn or change in bowel habits Lymphatics: Denies new lymphadenopathy or easy bruising Behavioral/Psych: Mood is stable, no new changes  All other systems were reviewed with the patient and are negative.  I have reviewed the past medical history, past surgical history, social history and family history with the patient and they are unchanged from previous note.  ALLERGIES:  has No Known Allergies.  MEDICATIONS:  Current Outpatient Medications  Medication Sig Dispense Refill  . chlorhexidine (PERIDEX) 0.12 % solution Rinse with 15 mls twice daily for 30 seconds. Use after breakfast and at bedtime. Spit out excess. Do not swallow. 480 mL 3  . fluconazole (DIFLUCAN) 100 MG tablet Take 1 tablet (100 mg total) by mouth daily. 7 tablet 0  .  HYDROcodone-acetaminophen (NORCO) 5-325 MG tablet Take 1 tablet by mouth every 8 (eight) hours as needed for moderate pain. 60 tablet 0  . levothyroxine (SYNTHROID) 137 MCG tablet Take 137 mcg by mouth daily before breakfast.    . lidocaine-prilocaine (EMLA) cream Apply to affected area once 30 g 3  . Nutritional  Supplements (KATE FARMS PEPTIDE 1.5) LIQD Take 4 Bottles by mouth daily. 325 mL 11  . ondansetron (ZOFRAN) 8 MG tablet Take 1 tablet (8 mg total) by mouth 2 (two) times daily as needed (Nausea or vomiting). 30 tablet 1  . prochlorperazine (COMPAZINE) 10 MG tablet Take 1 tablet (10 mg total) by mouth every 6 (six) hours as needed (Nausea or vomiting). 30 tablet 1  . sodium fluoride (PREVIDENT 5000 PLUS) 1.1 % CREA dental cream Apply cream to tooth brush. Brush teeth for 2 minutes. Spit out excess. DO NOT rinse afterwards. Repeat nightly. (Patient taking differently: Place 1 application onto teeth 2 (two) times a day. ) 1 Tube prn   No current facility-administered medications for this visit.   Facility-Administered Medications Ordered in Other Visits  Medication Dose Route Frequency Provider Last Rate Last Admin  . heparin lock flush 100 unit/mL  500 Units Intracatheter Once PRN Alvy Bimler, Ranya Fiddler, MD      . pembrolizumab (KEYTRUDA) 200 mg in sodium chloride 0.9 % 50 mL chemo infusion  200 mg Intravenous Once Bralee Feldt, MD      . sodium chloride flush (NS) 0.9 % injection 10 mL  10 mL Intracatheter PRN Alvy Bimler, Harjit Leider, MD        PHYSICAL EXAMINATION: ECOG PERFORMANCE STATUS: 1 - Symptomatic but completely ambulatory  Vitals:   05/18/20 0832  BP: 104/66  Pulse: 79  Resp: 18  Temp: (!) 97.3 F (36.3 C)  SpO2: 98%   Filed Weights   05/18/20 0832  Weight: 135 lb 6.4 oz (61.4 kg)    GENERAL:alert, no distress and comfortable SKIN: Noted faint rash on the left shoulder EYES: normal, Conjunctiva are pink and non-injected, sclera clear OROPHARYNX: Poor dentition is noted.  Noted oral thrush NECK: She have tracheostomy in situ.  Noted abnormal baseline neck position  LYMPH:  no palpable lymphadenopathy in the cervical, axillary or inguinal LUNGS: clear to auscultation and percussion with normal breathing effort HEART: regular rate & rhythm and no murmurs and no lower extremity  edema ABDOMEN:abdomen soft, non-tender and normal bowel sounds.  Feeding tube site looks okay Musculoskeletal:no cyanosis of digits and no clubbing  NEURO: alert & oriented x 3 with fluent speech, no focal motor/sensory deficits  LABORATORY DATA:  I have reviewed the data as listed    Component Value Date/Time   NA 138 05/18/2020 0818   K 4.4 05/18/2020 0818   CL 101 05/18/2020 0818   CO2 29 05/18/2020 0818   GLUCOSE 97 05/18/2020 0818   BUN 27 (H) 05/18/2020 0818   CREATININE 0.75 05/18/2020 0818   CREATININE 0.60 02/17/2020 1128   CALCIUM 9.6 05/18/2020 0818   PROT 7.4 05/18/2020 0818   ALBUMIN 3.9 05/18/2020 0818   AST 25 05/18/2020 0818   AST 22 02/17/2020 1128   ALT 31 05/18/2020 0818   ALT 24 02/17/2020 1128   ALKPHOS 79 05/18/2020 0818   BILITOT 0.3 05/18/2020 0818   BILITOT 0.5 02/17/2020 1128   GFRNONAA >60 05/18/2020 0818   GFRNONAA >60 02/17/2020 1128   GFRAA >60 05/18/2020 0818   GFRAA >60 02/17/2020 1128    No results found for: SPEP, UPEP  Lab Results  Component Value Date   WBC 4.8 05/18/2020   NEUTROABS 2.8 05/18/2020   HGB 13.0 05/18/2020   HCT 39.5 05/18/2020   MCV 85.3 05/18/2020   PLT 205 05/18/2020      Chemistry      Component Value Date/Time   NA 138 05/18/2020 0818   K 4.4 05/18/2020 0818   CL 101 05/18/2020 0818   CO2 29 05/18/2020 0818   BUN 27 (H) 05/18/2020 0818   CREATININE 0.75 05/18/2020 0818   CREATININE 0.60 02/17/2020 1128      Component Value Date/Time   CALCIUM 9.6 05/18/2020 0818   ALKPHOS 79 05/18/2020 0818   AST 25 05/18/2020 0818   AST 22 02/17/2020 1128   ALT 31 05/18/2020 0818   ALT 24 02/17/2020 1128   BILITOT 0.3 05/18/2020 0818   BILITOT 0.5 02/17/2020 1128

## 2020-05-18 NOTE — Assessment & Plan Note (Signed)
She has limited neck mobility secondary to radiation fibrosis She is having some left shoulder pain I recommend referring her to cancer rehab facility for therapy and she is in agreement

## 2020-05-18 NOTE — Progress Notes (Signed)
Nutrition follow-up completed with patient during infusion for recurrent laryngeal cancer. Weight improved and documented as 135.4 pounds on August 26.  This is increased from 131 pounds on July 8.  Weight is trending up. Patient denies nausea, vomiting, constipation, and diarrhea. She is using 4-5 cartons of [employer] peptide 1.5 via feeding tube on a daily basis. New orders were written and sent to home health agency [facility]) on August 5.  There is no nutrition diagnosis at this time.  I will continue to monitor patient as needed.  She has my contact information for questions or concerns.  **Disclaimer: This note was dictated with voice recognition software. Similar sounding words can inadvertently be transcribed and this note may contain transcription errors which may not have been corrected upon publication of note.**

## 2020-05-18 NOTE — Assessment & Plan Note (Signed)
She has new oral thrush which is not unexpected from side effects of recent treatment I recommend a course of fluconazole

## 2020-05-31 DIAGNOSIS — Z430 Encounter for attention to tracheostomy: Secondary | ICD-10-CM | POA: Diagnosis not present

## 2020-06-06 ENCOUNTER — Other Ambulatory Visit: Payer: Self-pay | Admitting: Hematology and Oncology

## 2020-06-06 ENCOUNTER — Telehealth: Payer: Self-pay

## 2020-06-06 DIAGNOSIS — C329 Malignant neoplasm of larynx, unspecified: Secondary | ICD-10-CM

## 2020-06-06 NOTE — Telephone Encounter (Signed)
Called and left a message asking for a call back to the office regarding appt changes.

## 2020-06-06 NOTE — Telephone Encounter (Signed)
Called and spoke with [name], friend. Unable to get son. Given new appts for 9/16. PET scan appt canceled for tomorrow due to not having insurance approval. Given appt time to see Dr. Alvy Bimler on 9/20 at 9 am with Infusion appt to follow. Cindy verbalized understanding and will tell Joe.

## 2020-06-07 ENCOUNTER — Ambulatory Visit (HOSPITAL_COMMUNITY): Payer: Medicare HMO

## 2020-06-08 ENCOUNTER — Inpatient Hospital Stay: Payer: Medicare HMO | Attending: Hematology

## 2020-06-08 ENCOUNTER — Other Ambulatory Visit: Payer: Self-pay

## 2020-06-08 ENCOUNTER — Ambulatory Visit (HOSPITAL_COMMUNITY)
Admission: RE | Admit: 2020-06-08 | Discharge: 2020-06-08 | Disposition: A | Discharge status: Home / Self Care | Payer: Medicare HMO | Source: Ambulatory Visit | Attending: Hematology and Oncology | Admitting: Hematology and Oncology

## 2020-06-08 ENCOUNTER — Encounter (HOSPITAL_COMMUNITY): Payer: Self-pay

## 2020-06-08 ENCOUNTER — Other Ambulatory Visit: Payer: Medicare HMO

## 2020-06-08 ENCOUNTER — Ambulatory Visit: Payer: Medicare HMO | Admitting: Hematology and Oncology

## 2020-06-08 ENCOUNTER — Ambulatory Visit: Payer: Medicare HMO

## 2020-06-08 ENCOUNTER — Inpatient Hospital Stay: Payer: Medicare HMO

## 2020-06-08 DIAGNOSIS — I6522 Occlusion and stenosis of left carotid artery: Secondary | ICD-10-CM | POA: Diagnosis not present

## 2020-06-08 DIAGNOSIS — Z9221 Personal history of antineoplastic chemotherapy: Secondary | ICD-10-CM | POA: Diagnosis not present

## 2020-06-08 DIAGNOSIS — E039 Hypothyroidism, unspecified: Secondary | ICD-10-CM | POA: Diagnosis not present

## 2020-06-08 DIAGNOSIS — C321 Malignant neoplasm of supraglottis: Secondary | ICD-10-CM | POA: Diagnosis present

## 2020-06-08 DIAGNOSIS — Z87891 Personal history of nicotine dependence: Secondary | ICD-10-CM | POA: Diagnosis not present

## 2020-06-08 DIAGNOSIS — M542 Cervicalgia: Secondary | ICD-10-CM | POA: Insufficient documentation

## 2020-06-08 DIAGNOSIS — G893 Neoplasm related pain (acute) (chronic): Secondary | ICD-10-CM | POA: Diagnosis not present

## 2020-06-08 DIAGNOSIS — Z930 Tracheostomy status: Secondary | ICD-10-CM | POA: Insufficient documentation

## 2020-06-08 DIAGNOSIS — C760 Malignant neoplasm of head, face and neck: Secondary | ICD-10-CM | POA: Diagnosis not present

## 2020-06-08 DIAGNOSIS — Z5112 Encounter for antineoplastic immunotherapy: Secondary | ICD-10-CM | POA: Diagnosis present

## 2020-06-08 DIAGNOSIS — Z923 Personal history of irradiation: Secondary | ICD-10-CM | POA: Diagnosis not present

## 2020-06-08 DIAGNOSIS — Z95828 Presence of other vascular implants and grafts: Secondary | ICD-10-CM

## 2020-06-08 DIAGNOSIS — I700 Atherosclerosis of aorta: Secondary | ICD-10-CM | POA: Diagnosis not present

## 2020-06-08 DIAGNOSIS — C329 Malignant neoplasm of larynx, unspecified: Secondary | ICD-10-CM | POA: Diagnosis present

## 2020-06-08 DIAGNOSIS — R918 Other nonspecific abnormal finding of lung field: Secondary | ICD-10-CM | POA: Diagnosis not present

## 2020-06-08 DIAGNOSIS — R911 Solitary pulmonary nodule: Secondary | ICD-10-CM | POA: Diagnosis not present

## 2020-06-08 LAB — COMPREHENSIVE METABOLIC PANEL
ALT: 27 U/L (ref 0–44)
AST: 24 U/L (ref 15–41)
Albumin: 3.9 g/dL (ref 3.5–5.0)
Alkaline Phosphatase: 73 U/L (ref 38–126)
Anion gap: 8 (ref 5–15)
BUN: 25 mg/dL — ABNORMAL HIGH (ref 8–23)
CO2: 27 mmol/L (ref 22–32)
Calcium: 9 mg/dL (ref 8.9–10.3)
Chloride: 103 mmol/L (ref 98–111)
Creatinine, Ser: 0.68 mg/dL (ref 0.44–1.00)
GFR calc Af Amer: >60 mL/min (ref >60)
GFR calc non Af Amer: >60 mL/min (ref >60)
Glucose, Bld: 93 mg/dL (ref 70–99)
Potassium: 4 mmol/L (ref 3.5–5.1)
Sodium: 138 mmol/L (ref 135–145)
Total Bilirubin: 0.3 mg/dL (ref 0.3–1.2)
Total Protein: 7.2 g/dL (ref 6.5–8.1)

## 2020-06-08 LAB — CBC WITH DIFFERENTIAL/PLATELET
Abs Immature Granulocytes: 0.01 10*3/uL (ref 0.00–0.07)
Basophils Absolute: 0 10*3/uL (ref 0.0–0.1)
Basophils Relative: 0 %
Eosinophils Absolute: 0.2 10*3/uL (ref 0.0–0.5)
Eosinophils Relative: 5 %
HCT: 38.9 % (ref 36.0–46.0)
Hemoglobin: 12.9 g/dL (ref 12.0–15.0)
Immature Granulocytes: 0 %
Lymphocytes Relative: 26 %
Lymphs Abs: 1.2 10*3/uL (ref 0.7–4.0)
MCH: 29.1 pg (ref 26.0–34.0)
MCHC: 33.2 g/dL (ref 30.0–36.0)
MCV: 87.8 fL (ref 80.0–100.0)
Monocytes Absolute: 0.5 10*3/uL (ref 0.1–1.0)
Monocytes Relative: 11 %
Neutro Abs: 2.7 10*3/uL (ref 1.7–7.7)
Neutrophils Relative %: 58 %
Platelets: 207 10*3/uL (ref 150–400)
RBC: 4.43 MIL/uL (ref 3.87–5.11)
RDW: 14.3 % (ref 11.5–15.5)
WBC: 4.6 10*3/uL (ref 4.0–10.5)
nRBC: 0 % (ref 0.0–0.2)

## 2020-06-08 LAB — TSH: TSH: 1.803 u[IU]/mL (ref 0.308–3.960)

## 2020-06-08 LAB — T4, FREE: Free T4: 1.13 ng/dL — ABNORMAL HIGH (ref 0.61–1.12)

## 2020-06-08 IMAGING — CT CT NECK W/ CM
4 of 5 series · 14 of 33 positions shown, 16 images · IV contrast (omnipaque)
Comparison: CT neck [DATE]

CLINICAL DATA: The original carcinoma status post chemotherapy and
radiation.

EXAM:
CT NECK WITH CONTRAST
TECHNIQUE: Multidetector CT imaging of the neck was performed using the
standard protocol following the bolus administration of intravenous
contrast.
CONTRAST:  75mL OMNIPAQUE IOHEXOL 300 MG/ML  SOLN

[Series 4: axial bone · axial · 0.54mm/px · z∈[-182,-68]mm · 3 of 115 slices shown, 4 images]
[im 29/115  soft-tissue]
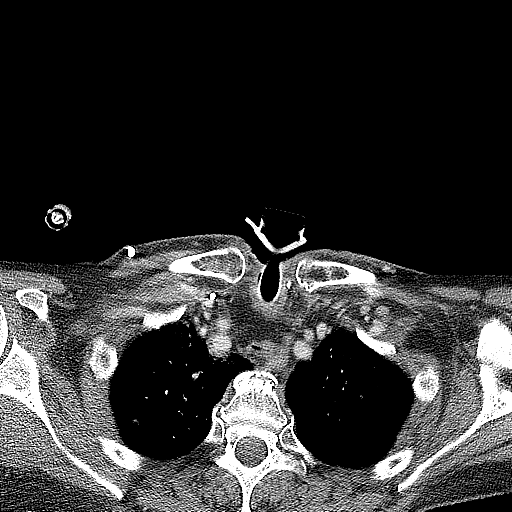
[im 29/115  bone]
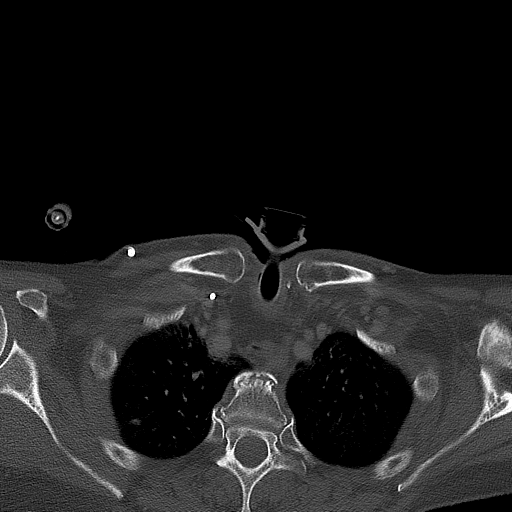
[im 58/115  bone]
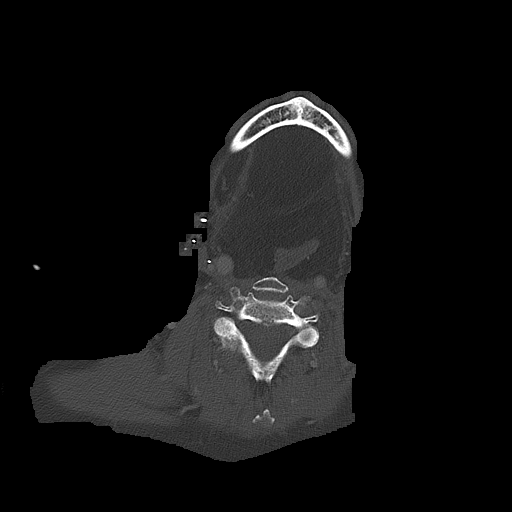
[im 86/115  bone]
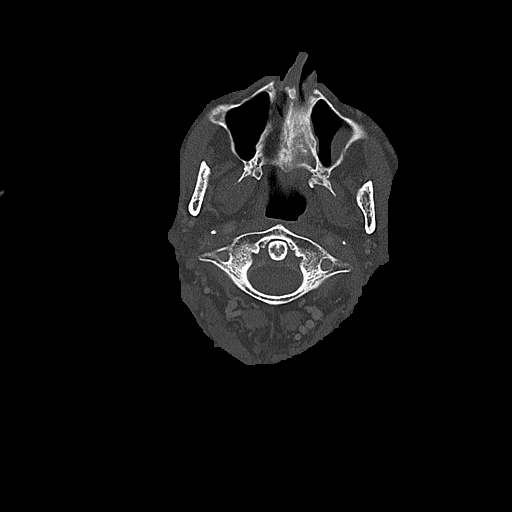

[Series 5: orthogonal (person_name) · axial · 0.39mm/px · z∈[-180,-66]mm · 3 of 113 slices shown]
[im 29/113  bone]
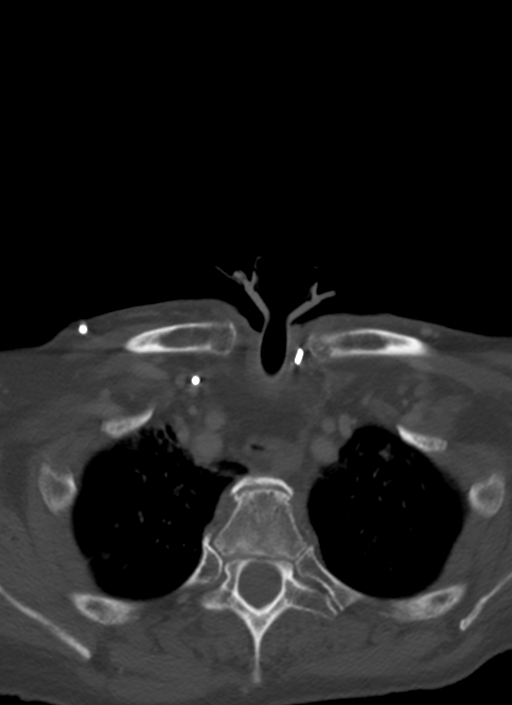
[im 57/113  bone]
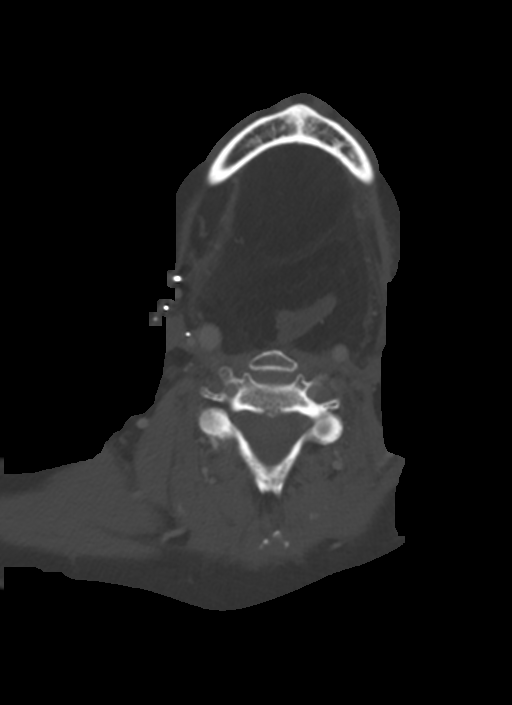
[im 85/113  bone]
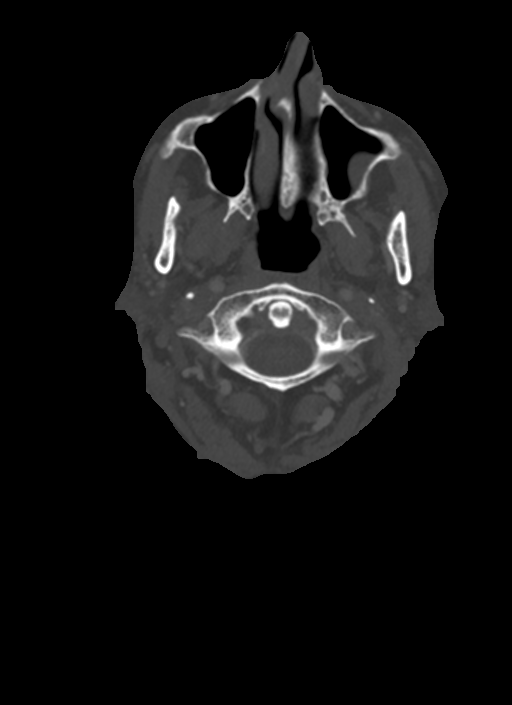

[Series 6: cor neck · coronal · 0.45mm/px · 3 of 126 slices shown]
[im 26/126  bone]
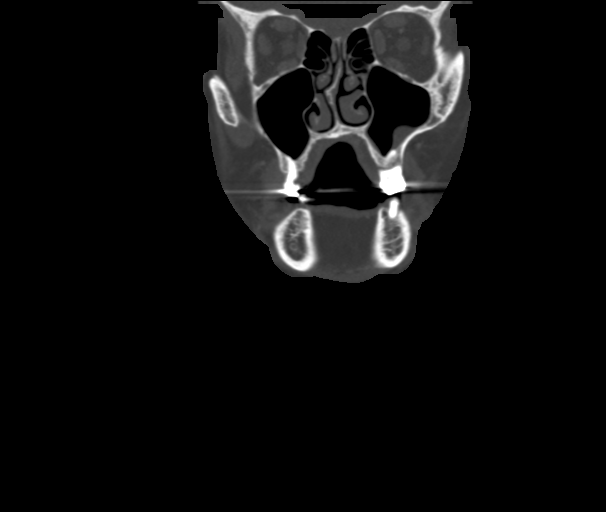
[im 51/126  bone]
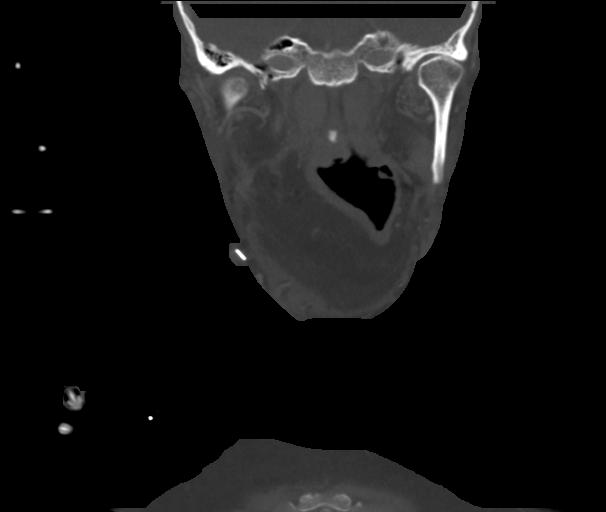
[im 76/126  bone]
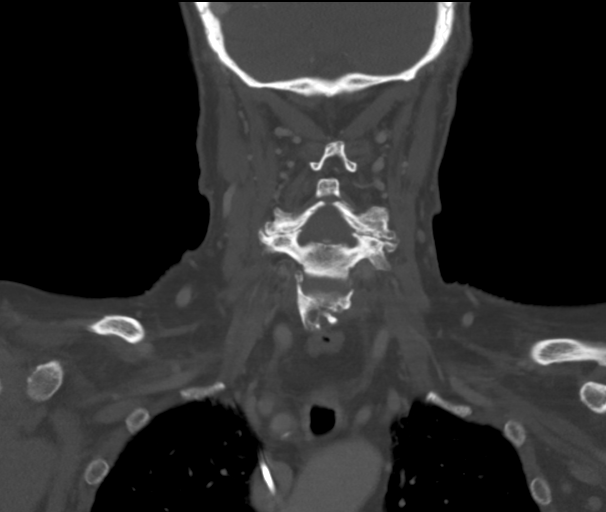

[Series 7: sag neck · sagittal · 0.45mm/px · 5 of 110 slices shown, 6 images]
[im 37/110  bone]
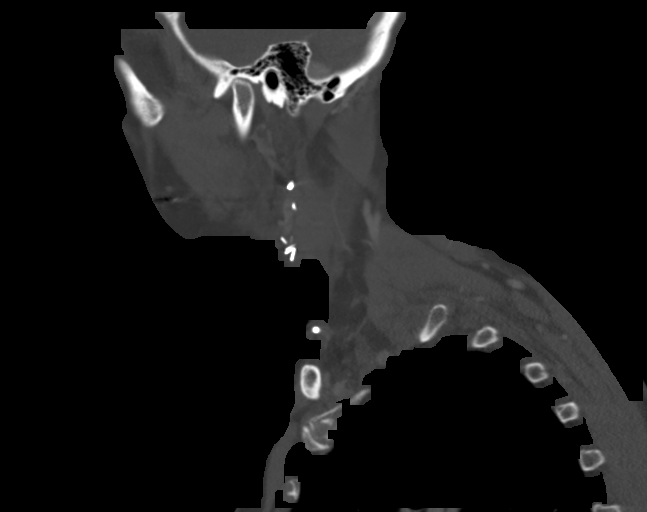
[im 46/110  bone]
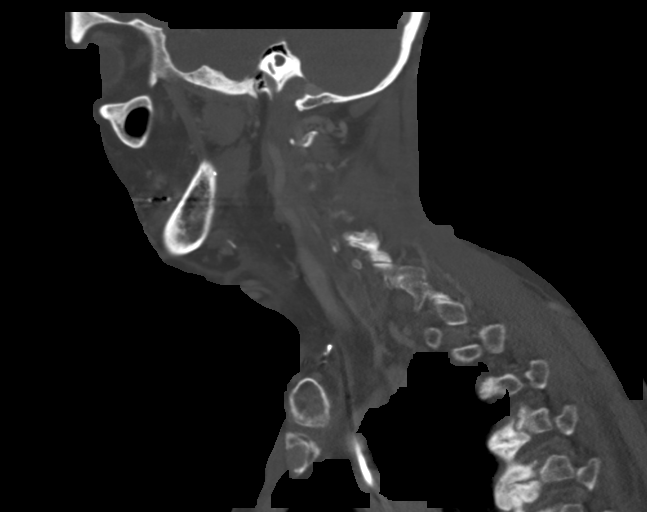
[im 55/110  soft-tissue]
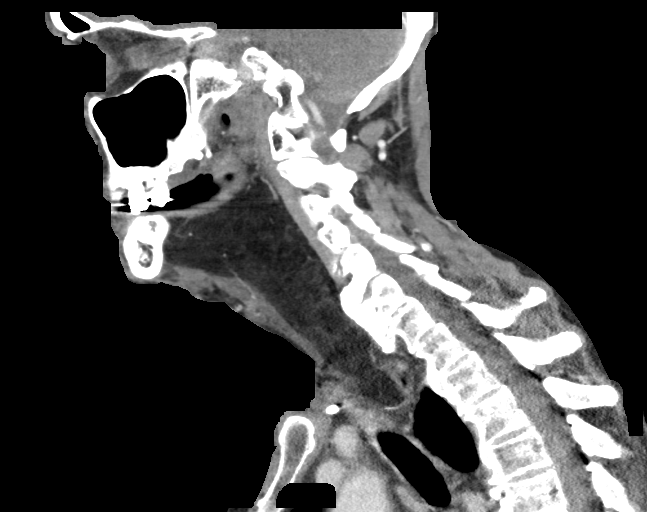
[im 55/110  bone]
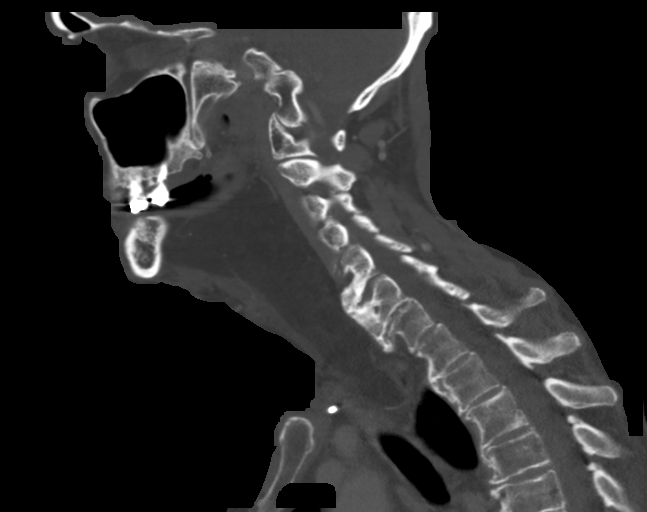
[im 64/110  bone]
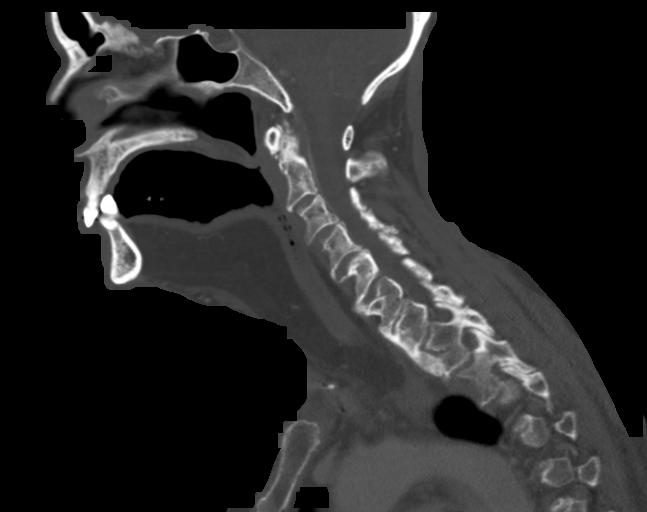
[im 73/110  bone]
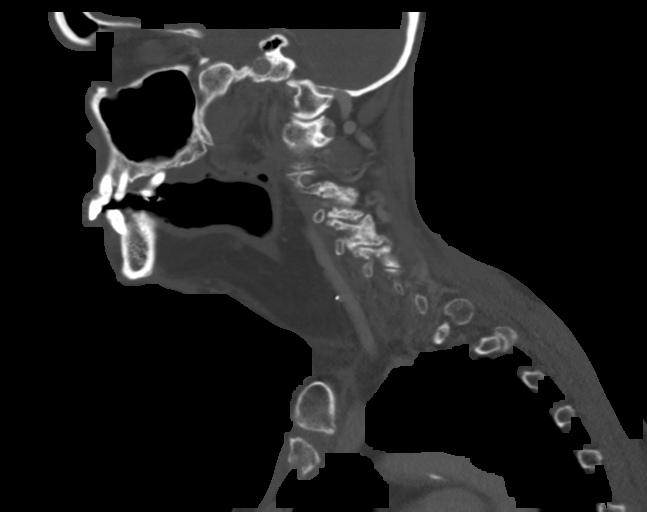

[14 of 33 positions shown; findings below may reference images not displayed]

FINDINGS: Pharynx and larynx: Status post laryngectomy and neck dissection.
There is a latissimus dorsi flap.

Salivary glands: Parotid glands are normal. Absent submandibular
glands.

Thyroid: Surgically absent

Lymph nodes: Bilateral neck dissections.  No lymphadenopathy.

Vascular: Short segment severe stenosis of the left internal carotid
artery adjacent to a surgical clip (series 2, image 36)

Limited intracranial: Normal

Visualized orbits: Bilateral ocular lens replacements.

Mastoids and visualized paranasal sinuses: Clear

Skeleton: No acute or aggressive process.

Upper chest: Please refer to dedicated report for CT of the chest.

Other: None
IMPRESSION: 1. Status post laryngectomy and neck dissection without
lymphadenopathy or mass lesion.
2. Short segment severe stenosis of the left internal carotid artery
adjacent to a surgical clip.

## 2020-06-08 IMAGING — CT CT CHEST W/ CM
2 of 4 series · 15 of 36 positions shown, 18 images · IV contrast (omnipaque)
Comparison: PET-CT, [DATE], CT chest, [DATE]

CLINICAL DATA: Head and neck cancer, assess treatment response

EXAM:
CT CHEST WITH CONTRAST
TECHNIQUE: Multidetector CT imaging of the chest was performed during
intravenous contrast administration.
CONTRAST:  75mL OMNIPAQUE IOHEXOL 300 MG/ML  SOLN

[Series 4: coronal · coronal · 0.60mm/px · 3 of 127 slices shown]
[im 26/127  lung]
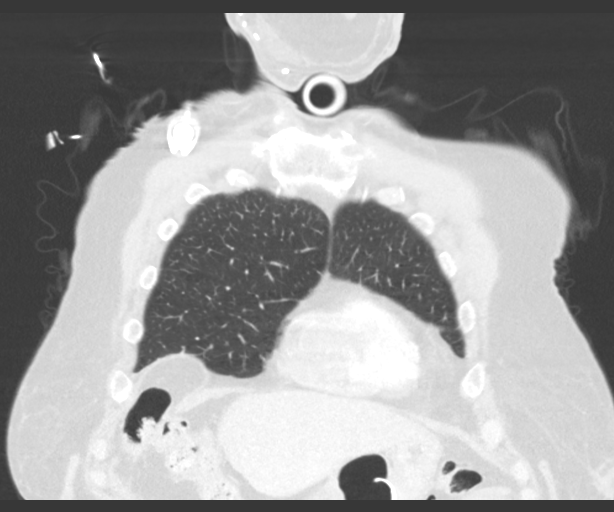
[im 51/127  lung]
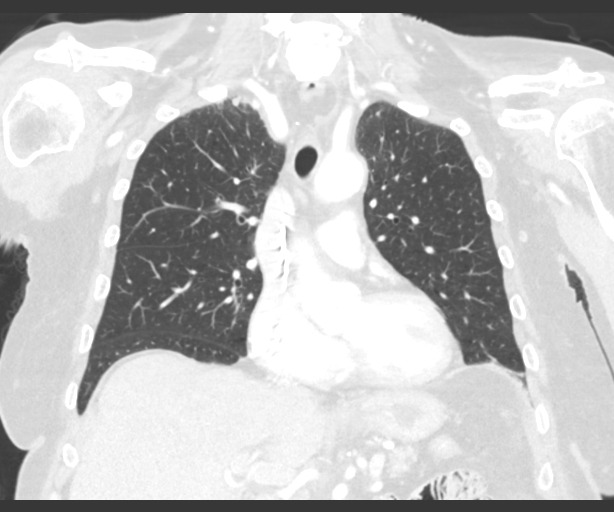
[im 76/127  lung]
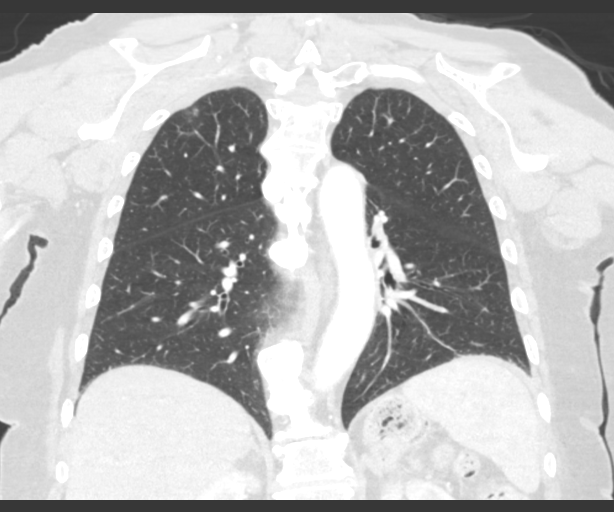

[Series 6: lungs · axial · 0.78mm/px · z∈[-380,-140]mm · 12 of 136 slices shown, 15 images]
[im 8/136  mediastinal]
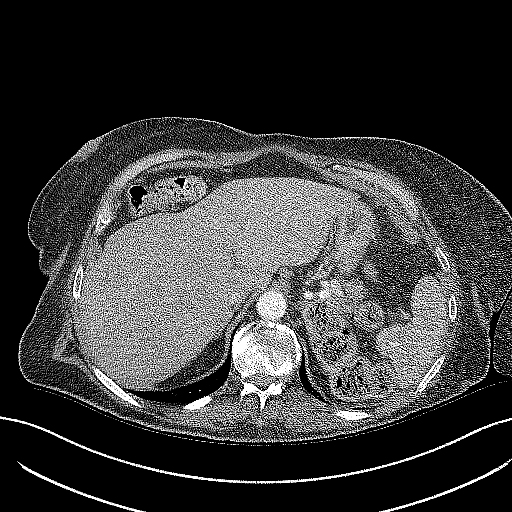
[im 8/136  lung]
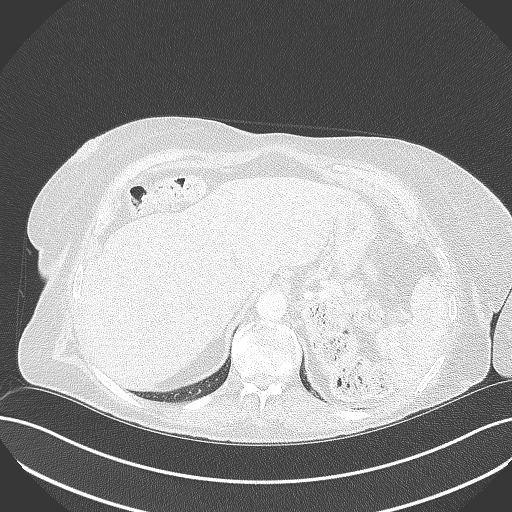
[im 22/136  lung]
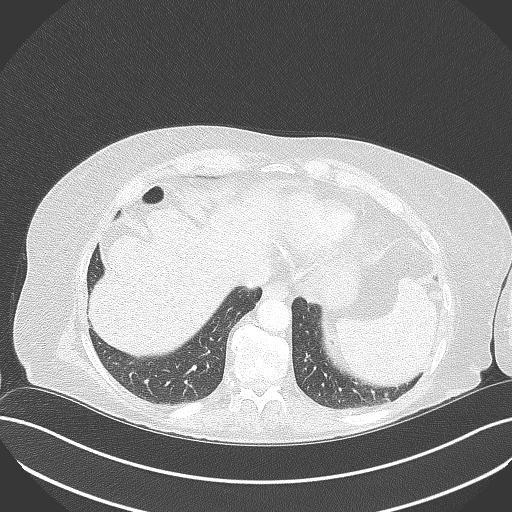
[im 29/136  lung]
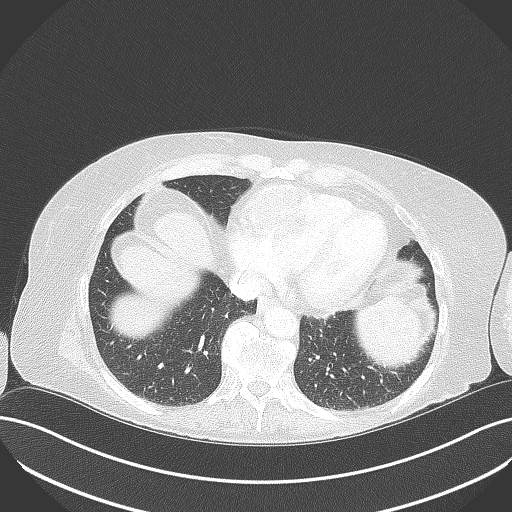
[im 43/136  lung]
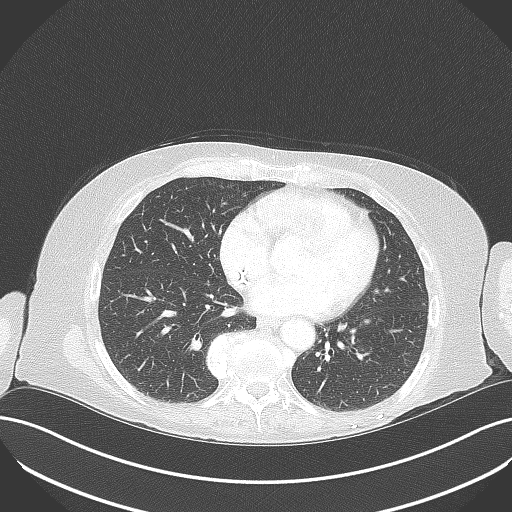
[im 50/136  mediastinal]
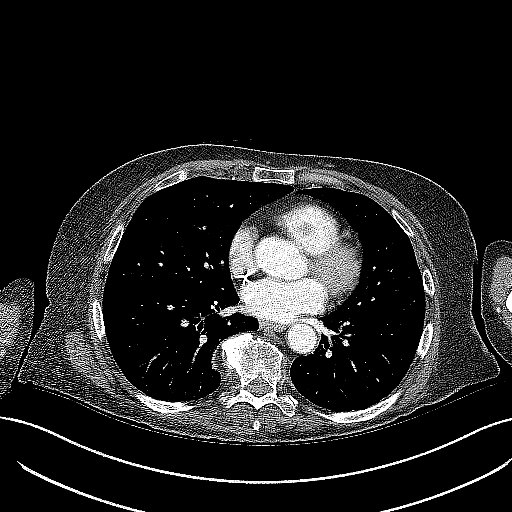
[im 50/136  lung]
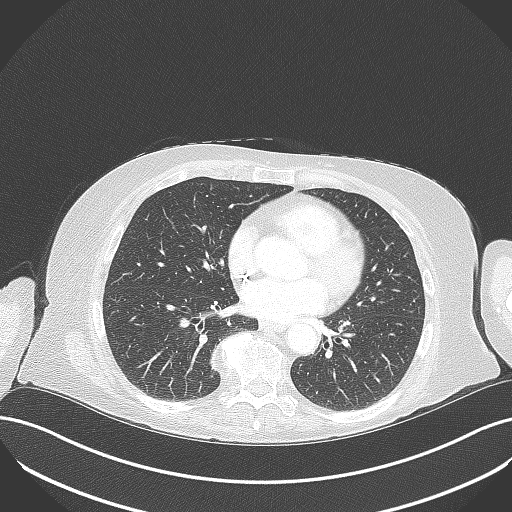
[im 64/136  lung]
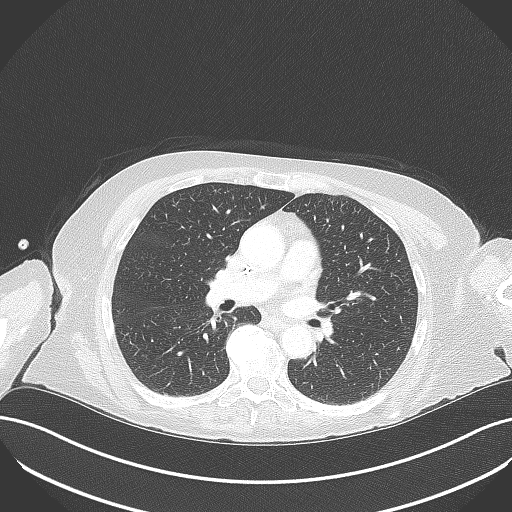
[im 72/136  lung]
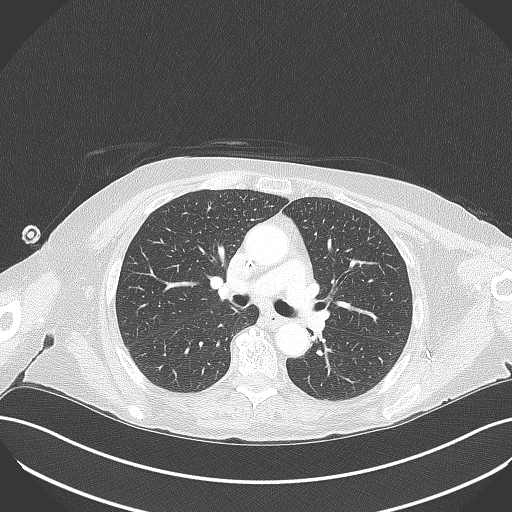
[im 86/136  lung]
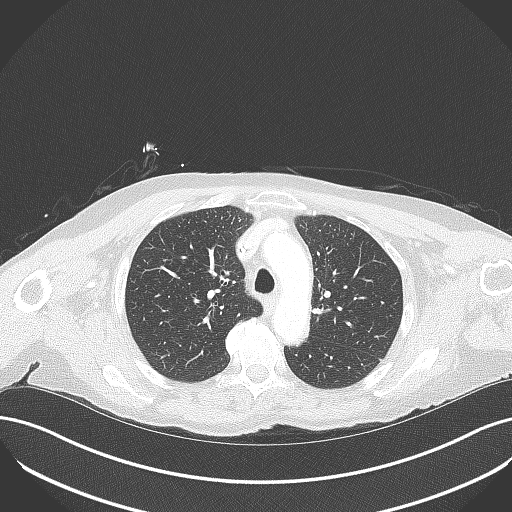
[im 93/136  mediastinal]
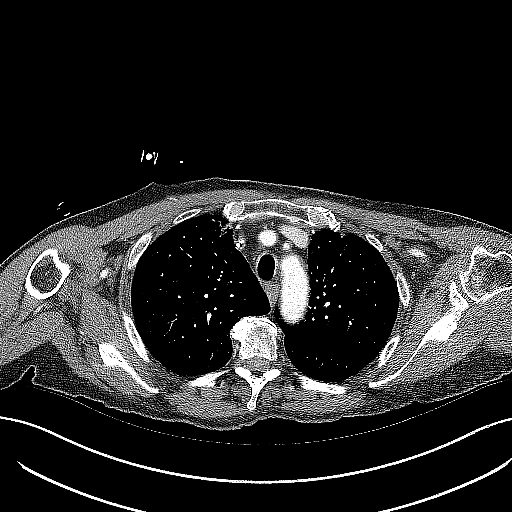
[im 93/136  lung]
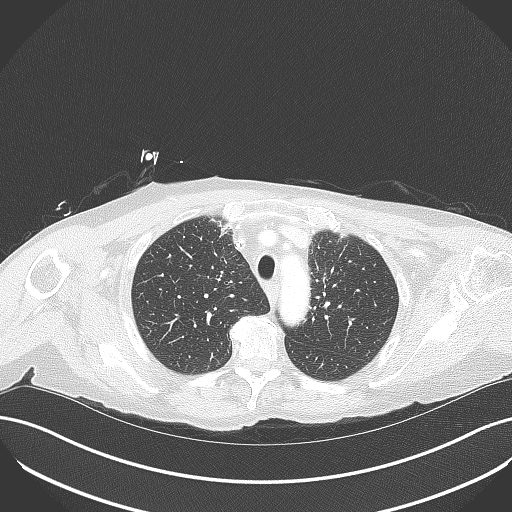
[im 107/136  lung]
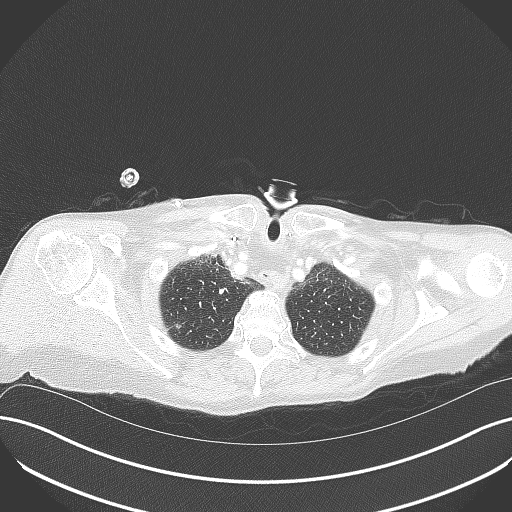
[im 114/136  lung]
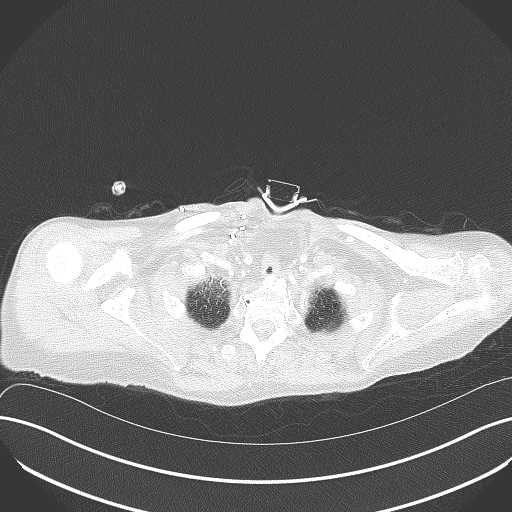
[im 128/136  lung]
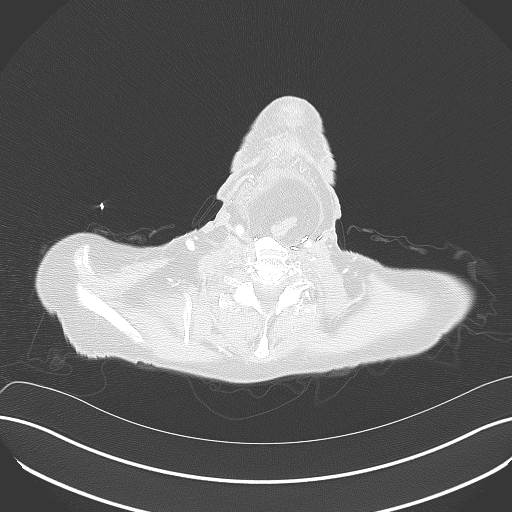

[15 of 36 positions shown; findings below may reference images not displayed]

FINDINGS: Cardiovascular: Right chest port catheter. Normal heart size. There
is a large, hypodense within, or very closely abutting and likely
intruding upon the right ventricle as seen on prior examination and
approximately 4.6 x 4.3 cm, poorly evaluated due to cardiac motion
(series 3, image 42). No pericardial effusion. Aortic
atherosclerosis.

Mediastinum/Nodes: No enlarged mediastinal, hilar, or axillary lymph
nodes. Tracheostomy. Thyroid gland, trachea, and esophagus
demonstrate no other significant findings.

Lungs/Pleura: Multiple small pulmonary nodules in the bilateral lung
apices are unchanged, the largest a 6 mm nodule of the anterior left
apex (series 6, image 28). No new nodules. No pleural effusion or
pneumothorax.

Upper Abdomen: No acute abnormality. Partially imaged percutaneous
gastrostomy.

Musculoskeletal: No chest wall mass or suspicious bone lesions
identified.
IMPRESSION: 1. There is a large, hypodense lesion within, or very closely
abutting and likely intruding upon the right ventricle, as seen on
prior examination and approximately 4.6 x 4.3 cm, poorly evaluated
due to cardiac motion. Findings are consistent with metastatic
lesion and/or thrombus and poorly assessed by ungated CT. Consider
echocardiography and cardiac MRI for further assessment.

2. Multiple small pulmonary nodules in the bilateral lung apices are
unchanged. Attention on follow-up.

3.  No evidence of new metastatic disease in the chest.

4.  Aortic Atherosclerosis ([KP]-[KP]).

## 2020-06-08 MED ORDER — IOHEXOL 300 MG/ML  SOLN
75.0000 mL | Freq: Once | INTRAMUSCULAR | Status: AC | PRN
  Administered 2020-06-08: 75 mL via INTRAVENOUS

## 2020-06-08 MED ORDER — SODIUM CHLORIDE 0.9% FLUSH
10.0000 mL | Freq: Once | INTRAVENOUS | Status: AC
  Administered 2020-06-08: 10 mL
  Filled 2020-06-08: qty 10

## 2020-06-08 MED ORDER — HEPARIN SOD (PORK) LOCK FLUSH 100 UNIT/ML IV SOLN
500.0000 [IU] | Freq: Once | INTRAVENOUS | Status: AC
  Administered 2020-06-08: 500 [IU] via INTRAVENOUS

## 2020-06-08 MED ORDER — HEPARIN SOD (PORK) LOCK FLUSH 100 UNIT/ML IV SOLN
INTRAVENOUS | Status: AC
  Filled 2020-06-08: qty 5

## 2020-06-12 ENCOUNTER — Telehealth: Payer: Self-pay | Admitting: Hematology and Oncology

## 2020-06-12 ENCOUNTER — Inpatient Hospital Stay: Payer: Medicare HMO

## 2020-06-12 ENCOUNTER — Encounter: Payer: Self-pay | Admitting: Hematology and Oncology

## 2020-06-12 ENCOUNTER — Inpatient Hospital Stay (HOSPITAL_BASED_OUTPATIENT_CLINIC_OR_DEPARTMENT_OTHER): Payer: Medicare HMO | Admitting: Hematology and Oncology

## 2020-06-12 ENCOUNTER — Other Ambulatory Visit: Payer: Self-pay

## 2020-06-12 DIAGNOSIS — C329 Malignant neoplasm of larynx, unspecified: Secondary | ICD-10-CM

## 2020-06-12 DIAGNOSIS — E039 Hypothyroidism, unspecified: Secondary | ICD-10-CM | POA: Diagnosis not present

## 2020-06-12 DIAGNOSIS — L598 Other specified disorders of the skin and subcutaneous tissue related to radiation: Secondary | ICD-10-CM

## 2020-06-12 DIAGNOSIS — Z5112 Encounter for antineoplastic immunotherapy: Secondary | ICD-10-CM | POA: Diagnosis not present

## 2020-06-12 DIAGNOSIS — G893 Neoplasm related pain (acute) (chronic): Secondary | ICD-10-CM | POA: Diagnosis not present

## 2020-06-12 DIAGNOSIS — C321 Malignant neoplasm of supraglottis: Secondary | ICD-10-CM

## 2020-06-12 DIAGNOSIS — R633 Feeding difficulties: Secondary | ICD-10-CM | POA: Diagnosis not present

## 2020-06-12 MED ORDER — SODIUM CHLORIDE 0.9 % IV SOLN
200.0000 mg | Freq: Once | INTRAVENOUS | Status: AC
  Administered 2020-06-12: 200 mg via INTRAVENOUS
  Filled 2020-06-12: qty 8

## 2020-06-12 MED ORDER — HEPARIN SOD (PORK) LOCK FLUSH 100 UNIT/ML IV SOLN
500.0000 [IU] | Freq: Once | INTRAVENOUS | Status: AC | PRN
  Administered 2020-06-12: 500 [IU]
  Filled 2020-06-12: qty 5

## 2020-06-12 MED ORDER — SODIUM CHLORIDE 0.9% FLUSH
10.0000 mL | INTRAVENOUS | Status: DC | PRN
  Administered 2020-06-12: 10 mL
  Filled 2020-06-12: qty 10

## 2020-06-12 MED ORDER — HYDROCODONE-ACETAMINOPHEN 5-325 MG PO TABS: 1.0000 | ORAL_TABLET | Freq: Three times a day (TID) | ORAL | 0 refills | Status: DC | PRN

## 2020-06-12 MED ORDER — SODIUM CHLORIDE 0.9 % IV SOLN
Freq: Once | INTRAVENOUS | Status: AC
  Filled 2020-06-12: qty 250

## 2020-06-12 NOTE — Assessment & Plan Note (Signed)
She continues to have significant neck discomfort secondary to radiation induced fibrosis I recommend physical therapy and rehab

## 2020-06-12 NOTE — Patient Instructions (Signed)
[facility] Discharge Instructions for Patients Receiving Chemotherapy  Today you received the following chemotherapy agents: pembrolizumab.  To help prevent nausea and vomiting after your treatment, we encourage you to take your nausea medication as directed.   If you develop nausea and vomiting that is not controlled by your nausea medication, call the clinic.   BELOW ARE SYMPTOMS THAT SHOULD BE REPORTED IMMEDIATELY:  *FEVER GREATER THAN 100.5 F  *CHILLS WITH OR WITHOUT FEVER  NAUSEA AND VOMITING THAT IS NOT CONTROLLED WITH YOUR NAUSEA MEDICATION  *UNUSUAL SHORTNESS OF BREATH  *UNUSUAL BRUISING OR BLEEDING  TENDERNESS IN MOUTH AND THROAT WITH OR WITHOUT PRESENCE OF ULCERS  *URINARY PROBLEMS  *BOWEL PROBLEMS  UNUSUAL RASH Items with * indicate a potential emergency and should be followed up as soon as possible.  Feel free to call the clinic should you have any questions or concerns. The clinic phone number is [phone].  Please show the CHEMO ALERT CARD at check-in to the Emergency Department and triage nurse.   

## 2020-06-12 NOTE — Assessment & Plan Note (Signed)
Her pain is well controlled We discussed narcotic refill policy I refill her prescription today

## 2020-06-12 NOTE — Assessment & Plan Note (Signed)
This is likely due to prior radiation exposure We will continue to check her thyroid level and adjust medicine as needed

## 2020-06-12 NOTE — Telephone Encounter (Signed)
Scheduled appts per 9/20 los. Gave pt a print out of AVS.

## 2020-06-12 NOTE — Progress Notes (Signed)
[place] OFFICE PROGRESS NOTE  Patient Care Team: Hamrick, Lorin Mercy, MD as PCP - General (Family Medicine) Eppie Gibson, MD as Attending Physician (Radiation Oncology) Leota Sauers, RN (Inactive) as Oncology Nurse Navigator Karie Mainland, RD as Dietitian (Nutrition) Sharen Counter, CCC-SLP as Speech Language Pathologist (Speech Pathology) Malmfelt, Stephani Police, RN as Oncology Nurse Navigator (Oncology)  ASSESSMENT & PLAN:  Laryngeal squamous cell carcinoma ([place]) I have reviewed multiple imaging studies with the patient and her son Overall, she has stable disease control with single agent pembrolizumab The plan would be to continue treatment for 3 more months before repeating another CT imaging  Cancer associated pain Her pain is well controlled We discussed narcotic refill policy I refill her prescription today  Hypothyroidism This is likely due to prior radiation exposure We will continue to check her thyroid level and adjust medicine as needed  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   All questions were answered. The patient knows to call the clinic with any problems, questions or concerns. The total time spent in the appointment was 30 minutes encounter with patients including review of chart and various tests results, discussions about plan of care and coordination of care plan   Heath Lark, MD 06/12/2020 3:29 PM  INTERVAL HISTORY: Please see below for problem oriented charting. She returns for treatment and follow-up She is bothered by neck discomfort with mobility and neck pain Denies recent mouth pain Her oral thrush has resolved Her chronic pain is stable No recent infection, fever or chills Denies infusion reaction  SUMMARY  OF ONCOLOGIC HISTORY: Oncology History  Laryngeal squamous cell carcinoma ([place])  12/02/2018 Imaging   CT neck: IMPRESSION: 1. Supraglottic laryngeal carcinoma extending across the anterior commissure and invading the prelaryngeal fat, left aryepiglottic fold and left aspect of the epiglottis. 2. Severe narrowing of the laryngeal airway. 3. Bilateral subcentimeter level 2A and 2A cervical lymph nodes.     12/02/2018 Imaging   CT  Chest:  IMPRESSION: No evidence of metastatic disease in the chest.   12/03/2018 Pathology Results   (Care Everywhere; [facility]) A: Larynx, supraglottis, biopsy - Atypical squamous proliferation with at least low grade dysplasia and abundant keratinization - No definite high grade dysplasia or invasive carcinoma represented (see comment)   12/18/2018 Pathology Results   (Care Everywhere; [name]) A:  Neck, left, fine needle aspiration - Rare single atypical cells, no cells diagnostic of malignancy - Lymph node material present  B:  Neck, right, fine needle aspiration - Non-diagnostic specimen - No lymph node material identified - Mixed inflammation and blood   12/31/2018 Pathology Results   (Care Everywhere; [facility])  A: Larynx, left supraglottic mass, biopsy - Atypical squamous proliferation with abundant keratinization, suspicious for well-differentiated invasive squamous cell carcinoma  B: Larynx, left supraglottic mass, biopsy - Well-differentiated invasive squamous cell carcinoma with abundant keratinization (see comment)   01/18/2019 Initial Diagnosis   Laryngeal cancer ([place])   01/26/2019 Imaging   1. Locally advanced hypermetabolic laryngeal neoplasm centered in the left supraglottic space with bilateral glottic and epiglottic involvement and subglottic extension on the left. 2. Hypermetabolic bilateral level 3 neck nodal metastases. 3. Hypermetabolic high mediastinal nodal metastases between the trachea and upper thoracic  esophagus. 4. Otherwise no distant hypermetabolic metastatic disease. 5. Nodular  7 mm focus at the left lung base, below PET resolution, recommend attention on follow-up chest CT in 3 months. 6. Chronic findings include: Aortic Atherosclerosis (ICD10-I70.0). Cholelithiasis.   02/08/2019 Cancer Staging   Staging form: Larynx - Supraglottis, AJCC 8th Edition - Clinical stage from 02/08/2019: Stage IVA (cT3, cN2c, cM0) - Signed by Eppie Gibson, MD on 02/08/2019   02/11/2019 - 04/07/2019 Chemotherapy   The patient weekly cisplatin for chemotherapy treatment.     07/20/2019 Pathology Results   PROCEDURE: Total glossectomy, total pharyngectomy, total laryngectomy, total thyroidectomy, and cervical esophagectomy TUMOR SITE: Larynx TUMOR LATERALITY: Not specified TUMOR FOCALITY: Unifocal TUMOR SIZE:    GREATEST DIMENSION: 6.9 cm    ADDITIONAL DIMENSIONS: 5.4 x 2.4 cm HISTOLOGIC TYPE: Squamous cell carcinoma, conventional (keratinizing) HISTOLOGIC GRADE:  G1-G2: Well to moderately differentiated TUMOR EXTENSION: Tumor involves the posterior tongue, pharynx, larynx, tracheal cartilage, hyoid bone, cervical esophagus, vagus nerve, carotid artery, deep cervical fascia, ans surrounding skeletal muscle and soft tissue MARGINS: Uninvolved by invasive tumor; Perineural invasion is less than 30mm from the left soft tissue margin LYMPHOVASCULAR INVASION: Not identified PERINEURAL INVASION:  Present REGIONAL LYMPH NODES:     NUMBER OF LYMPH NODES INVOLVED: 2     NUMBER OF LYMPH NODES EXAMINED: 24     LATERALITY OF LYMPH NODES INVOLVED: Cannot determine     SIZE OF LARGEST METASTATIC DEPOSIT: 0.8 cm     EXTRANODAL EXTENSION: Not identified PATHOLOGIC STAGE CLASSIFICATION (pTNM, AJCC 8TH Ed): (r)(y) pT4b pN2a DISTANT METASTASIS (pM): ADDITIONAL PATHOLOGIC FINDINGS: Previous chemoradiation   11/04/2019 Imaging   PET: 1.  Hypermetabolic bilateral neck lesions as detailed above are concerning for  disease recurrence. 2.  Compared to 07/08/2019 PET scan, interval development of subcentimeter bilateral upper lobe pulmonary nodules, size below PET resolution. These are concerning for metastasis. Consider diagnostic quality CT chest exam for further evaluation as per oncology treatment planning. 3.  Focal hypermetabolic uptake in right ventricular cavity of indeterminate significance. Recommend echocardiographic examination to rule out underlying thrombus. This finding was discussed with Dr. Conley Canal by Dr. Clementeen Graham and Dr. Marcello Moores on 11/04/2019 at approximately 3:00 PM via telephone. 4.  Ancillary CT findings as above.   11/05/2019 Imaging   Echocardiogram: Summary   1. The left ventricle is normal in size with normal wall thickness.   2. The left ventricular systolic function is normal, LVEF is visually estimated at 60-65%.   3. The right ventricle is normal in size, with normal systolic function.   4. There is an extrinsic compression of the RV and RA laterally at the AV groove by a mass of 4.3 cm diameter. Unclear if the mass is invasive, although no pericardial effusion seen suggestive no invasion possibly. There is no evidence of hemodynamic compromise secondary to cardiac compression with normal IVC diameter.   12/06/2019 Pathology Results   A. LYMPH NODE, LEFT SUBMANDIBULAR, BIOPSY:  - Squamous cell carcinoma.  - No distinct nodal tissue identified.    12/06/2019 Procedure   Successful ultrasound-guided core biopsy of centrally necrotic left submandibular lymph node. Of note, due to extensive prior postsurgical and post radiation changes, patient has very limited mobility in the neck and the biopsy was extremely challenging    12/16/2019 -  Chemotherapy   The patient had pembrolizumab for chemotherapy treatment.     03/08/2020 PET scan   1. Diminished size of masses in the neck as described. Persistent increased metabolic activity particularly in the LEFT neck. Some of this diminished  size on  the LEFT could be due to decompression of necrotic material. Correlate with any symptoms of inflammation or infection in this area as well given the paucity of residual soft tissue associated with the above abnormality, overall findings are compatible with residual disease in this location and there is no significant surrounding stranding to indicate inflammation that would correlate with the degree of FDG uptake remaining. 2. Diminished activity in the juxta cardiac mass with peripheral activity suggesting interval necrosis, on the prior study this was uniformly hypermetabolic. There is still considerable FDG uptake. 3. No signs of disease in the abdomen or pelvis. 4. 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 Correlate with any new pain or symptoms in this area with further imaging as warranted. No discrete mass or bone destruction on today's study.   06/08/2020 Imaging   CT neck 1. Status post laryngectomy and neck dissection without lymphadenopathy or mass lesion. 2. Short segment severe stenosis of the left internal carotid artery adjacent to a surgical clip.     06/08/2020 Imaging   CT chest 1. There is a large, hypodense lesion within, or very closely abutting and likely intruding upon the right ventricle, as seen on prior examination and approximately 4.6 x 4.3 cm, poorly evaluated due to cardiac motion. Findings are consistent with metastatic lesion and/or thrombus and poorly assessed by ungated CT. Consider echocardiography and cardiac MRI for further assessment.   2. Multiple small pulmonary nodules in the bilateral lung apices are unchanged. Attention on follow-up.   3.  No evidence of new metastatic disease in the chest.   4.  Aortic Atherosclerosis (ICD10-I70.0).       REVIEW OF SYSTEMS:   Constitutional: Denies fevers, chills or abnormal weight loss Eyes: Denies blurriness of vision Ears, nose, mouth, throat, and face: Denies mucositis or sore throat Respiratory:  Denies cough, dyspnea or wheezes Cardiovascular: Denies palpitation, chest discomfort or lower extremity swelling Gastrointestinal:  Denies nausea, heartburn or change in bowel habits Skin: Denies abnormal skin rashes Lymphatics: Denies new lymphadenopathy or easy bruising Neurological:Denies numbness, tingling or new weaknesses Behavioral/Psych: Mood is stable, no new changes  All other systems were reviewed with the patient and are negative.  I have reviewed the past medical history, past surgical history, social history and family history with the patient and they are unchanged from previous note.  ALLERGIES:  has No Known Allergies.  MEDICATIONS:  Current Outpatient Medications  Medication Sig Dispense Refill  . chlorhexidine (PERIDEX) 0.12 % solution Rinse with 15 mls twice daily for 30 seconds. Use after breakfast and at bedtime. Spit out excess. Do not swallow. 480 mL 3  . HYDROcodone-acetaminophen (NORCO) 5-325 MG tablet Take 1 tablet by mouth every 8 (eight) hours as needed for moderate pain. 60 tablet 0  . levothyroxine (SYNTHROID) 137 MCG tablet Take 137 mcg by mouth daily before breakfast.    . lidocaine-prilocaine (EMLA) cream Apply to affected area once 30 g 3  . Nutritional Supplements (KATE FARMS PEPTIDE 1.5) LIQD Take 4 Bottles by mouth daily. 325 mL 11  . ondansetron (ZOFRAN) 8 MG tablet Take 1 tablet (8 mg total) by mouth 2 (two) times daily as needed (Nausea or vomiting). 30 tablet 1  . prochlorperazine (COMPAZINE) 10 MG tablet Take 1 tablet (10 mg total) by mouth every 6 (six) hours as needed (Nausea or vomiting). 30 tablet 1  . sodium fluoride (PREVIDENT 5000 PLUS) 1.1 % CREA dental cream Apply cream to tooth brush. Brush teeth for 2 minutes. Spit out excess. DO  NOT rinse afterwards. Repeat nightly. (Patient taking differently: Place 1 application onto teeth 2 (two) times a day. ) 1 Tube prn   No current facility-administered medications for this visit.    Facility-Administered Medications Ordered in Other Visits  Medication Dose Route Frequency Provider Last Rate Last Admin  . sodium chloride flush (NS) 0.9 % injection 10 mL  10 mL Intracatheter PRN Alvy Bimler, Eleanor Dimichele, MD   10 mL at 06/12/20 1033    PHYSICAL EXAMINATION: ECOG PERFORMANCE STATUS: 1 - Symptomatic but completely ambulatory  Vitals:   06/12/20 0833  BP: 113/61  Pulse: 84  Resp: 18  Temp: (!) 97.4 F (36.3 C)  SpO2: 100%   Filed Weights   06/12/20 0833  Weight: 137 lb 6.4 oz (62.3 kg)    GENERAL:alert, no distress and comfortable SKIN: skin color, texture, turgor are normal, no rashes or significant lesions EYES: normal, Conjunctiva are pink and non-injected, sclera clear OROPHARYNX:no exudate, no erythema and lips, buccal mucosa, and tongue normal  NECK: Tracheostomy site looks okay NEURO: alert & oriented x 3, no focal motor/sensory deficits  LABORATORY DATA:  I have reviewed the data as listed    Component Value Date/Time   NA 138 06/08/2020 1303   K 4.0 06/08/2020 1303   CL 103 06/08/2020 1303   CO2 27 06/08/2020 1303   GLUCOSE 93 06/08/2020 1303   BUN 25 (H) 06/08/2020 1303   CREATININE 0.68 06/08/2020 1303   CREATININE 0.60 02/17/2020 1128   CALCIUM 9.0 06/08/2020 1303   PROT 7.2 06/08/2020 1303   ALBUMIN 3.9 06/08/2020 1303   AST 24 06/08/2020 1303   AST 22 02/17/2020 1128   ALT 27 06/08/2020 1303   ALT 24 02/17/2020 1128   ALKPHOS 73 06/08/2020 1303   BILITOT 0.3 06/08/2020 1303   BILITOT 0.5 02/17/2020 1128   GFRNONAA >60 06/08/2020 1303   GFRNONAA >60 02/17/2020 1128   GFRAA >60 06/08/2020 1303   GFRAA >60 02/17/2020 1128    No results found for: SPEP, UPEP  Lab Results  Component Value Date   WBC 4.6 06/08/2020   NEUTROABS 2.7 06/08/2020   HGB 12.9 06/08/2020   HCT 38.9 06/08/2020   MCV 87.8 06/08/2020   PLT 207 06/08/2020      Chemistry      Component Value Date/Time   NA 138 06/08/2020 1303   K 4.0 06/08/2020 1303   CL 103  06/08/2020 1303   CO2 27 06/08/2020 1303   BUN 25 (H) 06/08/2020 1303   CREATININE 0.68 06/08/2020 1303   CREATININE 0.60 02/17/2020 1128      Component Value Date/Time   CALCIUM 9.0 06/08/2020 1303   ALKPHOS 73 06/08/2020 1303   AST 24 06/08/2020 1303   AST 22 02/17/2020 1128   ALT 27 06/08/2020 1303   ALT 24 02/17/2020 1128   BILITOT 0.3 06/08/2020 1303   BILITOT 0.5 02/17/2020 1128       RADIOGRAPHIC STUDIES: I have reviewed multiple CT imaging and PET CT scan with the patient and her son I have personally reviewed the radiological images as listed and agreed with the findings in the report. CT SOFT TISSUE NECK W CONTRAST  Result Date: 06/09/2020 CLINICAL DATA:  The original carcinoma status post chemotherapy and radiation. EXAM: CT NECK WITH CONTRAST TECHNIQUE: Multidetector CT imaging of the neck was performed using the standard protocol following the bolus administration of intravenous contrast. CONTRAST:  58mL OMNIPAQUE IOHEXOL 300 MG/ML  SOLN COMPARISON:  CT neck 06/03/2019 FINDINGS: Pharynx  and larynx: Status post laryngectomy and neck dissection. There is a latissimus dorsi flap. Salivary glands: Parotid glands are normal. Absent submandibular glands. Thyroid: Surgically absent Lymph nodes: Bilateral neck dissections.  No lymphadenopathy. Vascular: Short segment severe stenosis of the left internal carotid artery adjacent to a surgical clip (series 2, image 36) Limited intracranial: Normal Visualized orbits: Bilateral ocular lens replacements. Mastoids and visualized paranasal sinuses: Clear Skeleton: No acute or aggressive process. Upper chest: Please refer to dedicated report for CT of the chest. Other: None IMPRESSION: 1. Status post laryngectomy and neck dissection without lymphadenopathy or mass lesion. 2. Short segment severe stenosis of the left internal carotid artery adjacent to a surgical clip. Electronically Signed   By: Ulyses Jarred M.D.   On: 06/09/2020 00:03   CT  CHEST W CONTRAST  Result Date: 06/09/2020 CLINICAL DATA:  Head and neck cancer, assess treatment response EXAM: CT CHEST WITH CONTRAST TECHNIQUE: Multidetector CT imaging of the chest was performed during intravenous contrast administration. CONTRAST:  75mL OMNIPAQUE IOHEXOL 300 MG/ML  SOLN COMPARISON:  PET-CT, 03/08/2020, CT chest, 06/03/2019 FINDINGS: Cardiovascular: Right chest port catheter. Normal heart size. There is a large, hypodense within, or very closely abutting and likely intruding upon the right ventricle as seen on prior examination and approximately 4.6 x 4.3 cm, poorly evaluated due to cardiac motion (series 3, image 42). No pericardial effusion. Aortic atherosclerosis. Mediastinum/Nodes: No enlarged mediastinal, hilar, or axillary lymph nodes. Tracheostomy. Thyroid gland, trachea, and esophagus demonstrate no other significant findings. Lungs/Pleura: Multiple small pulmonary nodules in the bilateral lung apices are unchanged, the largest a 6 mm nodule of the anterior left apex (series 6, image 28). No new nodules. No pleural effusion or pneumothorax. Upper Abdomen: No acute abnormality. Partially imaged percutaneous gastrostomy. Musculoskeletal: No chest wall mass or suspicious bone lesions identified. IMPRESSION: 1. There is a large, hypodense lesion within, or very closely abutting and likely intruding upon the right ventricle, as seen on prior examination and approximately 4.6 x 4.3 cm, poorly evaluated due to cardiac motion. Findings are consistent with metastatic lesion and/or thrombus and poorly assessed by ungated CT. Consider echocardiography and cardiac MRI for further assessment. 2. Multiple small pulmonary nodules in the bilateral lung apices are unchanged. Attention on follow-up. 3.  No evidence of new metastatic disease in the chest. 4.  Aortic Atherosclerosis (ICD10-I70.0). Electronically Signed   By: Eddie Candle M.D.   On: 06/09/2020 10:05

## 2020-06-12 NOTE — Assessment & Plan Note (Signed)
I have reviewed multiple imaging studies with the patient and her son Overall, she has stable disease control with single agent pembrolizumab The plan would be to continue treatment for 3 more months before repeating another CT imaging

## 2020-06-13 ENCOUNTER — Ambulatory Visit: Payer: Medicare HMO | Admitting: Rehabilitation

## 2020-06-14 ENCOUNTER — Ambulatory Visit: Payer: Medicare HMO | Admitting: Endocrinology

## 2020-06-19 DIAGNOSIS — Z23 Encounter for immunization: Secondary | ICD-10-CM | POA: Diagnosis not present

## 2020-06-21 ENCOUNTER — Ambulatory Visit: Payer: Medicare HMO | Admitting: Radiation Oncology

## 2020-06-23 DIAGNOSIS — R633 Feeding difficulties, unspecified: Secondary | ICD-10-CM | POA: Diagnosis not present

## 2020-06-23 DIAGNOSIS — C321 Malignant neoplasm of supraglottis: Secondary | ICD-10-CM | POA: Diagnosis not present

## 2020-06-26 ENCOUNTER — Ambulatory Visit: Payer: Medicare HMO | Admitting: Physical Therapy

## 2020-07-03 ENCOUNTER — Encounter: Payer: Self-pay | Admitting: Physical Therapy

## 2020-07-03 ENCOUNTER — Other Ambulatory Visit: Payer: Self-pay

## 2020-07-03 ENCOUNTER — Ambulatory Visit: Payer: Medicare HMO | Attending: Hematology and Oncology | Admitting: Physical Therapy

## 2020-07-03 DIAGNOSIS — R293 Abnormal posture: Secondary | ICD-10-CM | POA: Insufficient documentation

## 2020-07-03 DIAGNOSIS — R262 Difficulty in walking, not elsewhere classified: Secondary | ICD-10-CM | POA: Insufficient documentation

## 2020-07-03 DIAGNOSIS — C321 Malignant neoplasm of supraglottis: Secondary | ICD-10-CM | POA: Insufficient documentation

## 2020-07-03 DIAGNOSIS — M25612 Stiffness of left shoulder, not elsewhere classified: Secondary | ICD-10-CM | POA: Diagnosis not present

## 2020-07-03 DIAGNOSIS — G8929 Other chronic pain: Secondary | ICD-10-CM | POA: Diagnosis not present

## 2020-07-03 DIAGNOSIS — M25512 Pain in left shoulder: Secondary | ICD-10-CM | POA: Diagnosis not present

## 2020-07-03 DIAGNOSIS — C329 Malignant neoplasm of larynx, unspecified: Secondary | ICD-10-CM | POA: Insufficient documentation

## 2020-07-03 DIAGNOSIS — L599 Disorder of the skin and subcutaneous tissue related to radiation, unspecified: Secondary | ICD-10-CM | POA: Insufficient documentation

## 2020-07-03 NOTE — Therapy (Signed)
[place], [place], [address] Phone: [phone]   Fax:  [phone]  Physical Therapy Evaluation  Patient Details  Name: [name] [name] MRN: [id number] Date of Birth: August 26, 1951 Referring Provider (PT): Alvy Bimler   Encounter Date: 07/03/2020   PT End of Session - 07/03/20 1053    Visit Number 1    Number of Visits 9    Date for PT Re-Evaluation 07/31/20    PT Start Time 1005    PT Stop Time 1048    PT Time Calculation (min) 43 min    Activity Tolerance Patient tolerated treatment well    Behavior During Therapy [facility] for tasks assessed/performed           Past Medical History:  Diagnosis Date  . Cataract   . History of radiation therapy 02/11/19- 04/06/19   bilateral neck nodes and upper mediastinum  . Laryngeal cancer ([place]) 11/2018  . Thyroid disease     Past Surgical History:  Procedure Laterality Date  . CATARACT EXTRACTION, BILATERAL  left 05/20/16, right 06/03/16   Cataract Extraction w/ IOL Implant; Surgeon: Sudie Grumbling, MD; Location: [facility]; Service: Ophthalmology   . CESAREAN SECTION    . ESOPHAGOGASTRODUODENOSCOPY (EGD) WITH PROPOFOL N/A 02/01/2019   Procedure: ESOPHAGOGASTRODUODENOSCOPY (EGD) WITH PROPOFOL;  Surgeon: Milus Banister, MD;  Location: [facility];  Service: Endoscopy;  Laterality: N/A;  . EUS N/A 02/01/2019   Procedure: UPPER ENDOSCOPIC ULTRASOUND (EUS) RADIAL-Will need Linear and Forward Viewing EUS Scope;  Surgeon: Milus Banister, MD;  Location: [facility];  Service: Endoscopy;  Laterality: N/A;  . FINE NEEDLE ASPIRATION N/A 02/01/2019   Procedure: FINE NEEDLE ASPIRATION (FNA) LINEAR;  Surgeon: Milus Banister, MD;  Location: [facility];  Service: Endoscopy;  Laterality: N/A;  . IR GASTROSTOMY TUBE MOD SED  02/09/2019  . IR IMAGING GUIDED PORT INSERTION  02/02/2019  . IR PATIENT EVAL TECH 0-60 MINS  01/06/2020  . KNEE CARTILAGE SURGERY Right 2019  .  TRACHEOSTOMY  12/02/2018   [facility]    There were no vitals filed for this visit.    Subjective Assessment - 07/03/20 1008    Subjective Pt does not talk but [name] ([place] partner) states pt has had decreaed left shoulder ROM and stabbing shoulder pain.    Patient is accompained by: Family member    Pertinent History cancer of larynx and supraglottis; 12/03/18- larynx, supraglottis biopsy, chemo from May 20- July 20, 10/27-Total glossectomy, total pharyngectomy, totallaryngectomy, total thyroidectomy, and cervical esophagectomy,TUMOR EXTENSION: Tumor involves the posterior tongue, pharynx,larynx, tracheal cartilage, hyoid bone, cervical esophagus, vagusnerve, carotid artery, deep cervical fascia, ans surroundingskeletal muscle and soft tissue. Pt completed radiaiton.    Patient Stated Goals to be able to move shoulder better and decrease pain    Currently in Pain? Yes    Pain Score 6     Pain Location Shoulder    Pain Orientation Left    Pain Descriptors / Indicators Stabbing    Pain Type Surgical pain    Pain Onset Other (comment)   more than a year   Pain Frequency Constant    Aggravating Factors  cooking    Pain Relieving Factors heating pad, keeping it still    Effect of Pain on Daily Activities has difficulty sleeping due to pain, hard to reach things              [facility] PT Assessment - 07/03/20 0001      Assessment  Medical Diagnosis laryngeal cancer    Referring Provider (PT) Alvy Bimler    Onset Date/Surgical Date 09/19/19    Hand Dominance Right    Prior Therapy was received home health PT earlier this eyar      Precautions   Precautions Other (comment)    Precaution Comments stoma      Restrictions   Weight Bearing Restrictions No      Balance Screen   Has the patient fallen in the past 6 months Yes    How many times? 1   when she turned she lost her balance   Has the patient had a decrease in activity level because of a fear of falling?  No    Is  the patient reluctant to leave their home because of a fear of falling?  No      Home Ecologist residence    Living Arrangements Spouse/significant other    Available Help at Discharge Family    Type of [place] One level      Prior Function   Level of [place] Retired    Leisure walks daily for about 1/8 mile      Cognition   Overall Cognitive Status Within Functional Limits for tasks assessed      Observation/Other Assessments   Observations pt has stoma and is unable to talk, left shoulder is lower than right and sits more forward      Posture/Postural Control   Posture/Postural Control Postural limitations    Postural Limitations Rounded Shoulders;Forward head      ROM / Strength   AROM / PROM / Strength AROM      AROM   AROM Assessment Site Cervical;Shoulder    Right/Left Shoulder Right;Left    Right Shoulder Flexion 150 Degrees    Right Shoulder ABduction 142 Degrees    Right Shoulder Internal Rotation 54 Degrees    Right Shoulder External Rotation 71 Degrees    Left Shoulder Flexion 61 Degrees    Left Shoulder ABduction 56 Degrees    Left Shoulder Internal Rotation --   not measured secondary to pain   Left Shoulder External Rotation --   not measured secondary to pain   Cervical Flexion 75% limited   due to stoma   Cervical Extension 75% limited due to pain in stoma    Cervical - Right Side Bend 90% limited due to stoma and pain in neck    Cervical - Left Side Bend 90% limited due to stoma and pain in neck    Cervical - Right Rotation 80% limited    Cervical - Left Rotation 85% limited       Palpation   Palpation comment L upper traps and levator extremely tight to palpation and very tender, rhomboids extrememly tight, thick scar tissue present along pack and shoulder from surgery                       Objective measurements completed on  examination: See above findings.               PT Education - 07/03/20 1104    Education Details anatomy and physiology of lymphatic system and signs and symptoms of lymphedema    Person(s) Educated Patient;Spouse    Methods Explanation    Comprehension Verbalized understanding  PT Long Term Goals - 07/03/20 1101      PT LONG TERM GOAL #1   Title Pt will report she is able to sleep through the night and not wake up due to pain in L shoulder    Baseline has difficulty sleeping due to pain    Time 4    Period Weeks    Status New    Target Date 07/31/20      PT LONG TERM GOAL #2   Title Pt will be able to prepare meals without being limited by her left shoulder ROM or pain    Time 4    Period Weeks    Status New    Target Date 07/31/20      PT LONG TERM GOAL #3   Title Pt will be independent in a home exercise program for all over strengthening and stretching for entire body.    Time 4    Period Weeks    Status New    Target Date 07/31/20      PT LONG TERM GOAL #4   Title Pt will demonstrate 90 degrees of left shoulder flexion and abduction to allow her to complete ADLs more easily.    Baseline flex: 61; abd: 56    Time 4    Period Weeks    Status New    Target Date 07/31/20                  Plan - 07/03/20 1053    Clinical Impression Statement Pt presents to PT with left shoulder pain and decreased ROM following extensive treatments for laryngeal and supraglottis cancer. Pt had extensive surgery that removed muscle/tissue from her back to help with reconstruction in Oct 2020. Pt received home health PT for some time but that has been discontinued. Pt has difficulty cooking due to pain and limited shoulder ROM and she also has trouble sleeping. She has less than 90 degrees of left shoulder flexion and abduction. She has stabbing pain with movement. Her muscles in L upper shoulder and scapular area are extrememly tight on palpation with pt  having increased pain. Pt has some mild edema in her neck which she reports overall has been improving. Pt also has had a fall in the past 6 months and her endurance has been limited. Pt would benefit from skilled PT services to increase L shoulder ROM, decrease L shoulder pain, and improve overall mobility.    Personal Factors and Comorbidities Time since onset of injury/illness/exacerbation;Fitness    Examination-Activity Limitations Lift;Reach Overhead;Carry;Sleep    Examination-Participation Restrictions Yard Work;Driving;Community Activity;Meal Prep;Cleaning;Laundry    Stability/Clinical Decision Making Evolving/Moderate complexity    Clinical Decision Making Moderate    Rehab Potential Good    PT Frequency 2x / week    PT Duration 4 weeks    PT Treatment/Interventions ADLs/Self Care Home Management;Iontophoresis 4mg /ml Dexamethasone;Therapeutic activities;Therapeutic exercise;Neuromuscular re-education;Patient/family education;Manual techniques;Scar mobilization;Passive range of motion;Taping    PT Next Visit Plan begin gentle soft tissue work to entire left posterior shoulder and scapular muscles, do scapular mobilization, PROM L shoulder; do 30 sec sit to stand and add goal for this to work on overall strength since pt had recent fall    Consulted and Agree with Plan of Care Patient           Patient will benefit from skilled therapeutic intervention in order to improve the following deficits and impairments:  Impaired flexibility, Decreased balance, Decreased activity tolerance, Decreased endurance, Decreased range of  motion, Decreased strength, Impaired UE functional use, Postural dysfunction, Decreased scar mobility, Increased fascial restricitons, Pain  Visit Diagnosis: Chronic left shoulder pain  Stiffness of left shoulder, not elsewhere classified  Abnormal posture  Disorder of the skin and subcutaneous tissue related to radiation, unspecified  Difficulty in walking, not  elsewhere classified  Malignant neoplasm of supraglottis ([facility])  Carcinoma larynx ([place])     Problem List Patient Active Problem List   Diagnosis Date Noted  . Oral thrush 05/18/2020  . Poor dentition 05/18/2020  . Radiation-induced fibrosis of skin from therapeutic procedure 05/18/2020  . Preventive measure 05/18/2020  . Tracheostomy care ([place]) 03/30/2020  . Hypoparathyroidism ([place]) 02/09/2020  . Pancytopenia, acquired ([place]) 01/27/2020  . Deficiency anemia 01/27/2020  . Cancer associated pain 01/27/2020  . Alaryngeal voice 12/15/2019  . Goals of care, counseling/discussion 07/07/2019  . Tracheostomy dependence ([place]) 04/05/2019  . Hyponatremia 03/31/2019  . Anemia due to antineoplastic chemotherapy 03/03/2019  . Protein malnutrition ([place]) 03/03/2019  . Port-A-Cath in place 02/10/2019  . 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 Acute respiratory failure with hypoxia and hypercapnia ([place]) 12/03/2018  . Hypothyroidism 12/03/2018  . Laryngeal mass 12/03/2018  . Type 2 MI (myocardial infarction) ([place]) 12/03/2018    [facility] 07/03/2020, 11:05 AM  [place], [place], [address] Phone: [phone]   Fax:  [phone]  Name: [name] [name] MRN: [id number] Date of Birth: 02/02/1951  Manus Gunning, PT 07/03/20 11:05 AM

## 2020-07-05 ENCOUNTER — Telehealth: Payer: Self-pay

## 2020-07-05 NOTE — Telephone Encounter (Signed)
I called the patient today about their upcoming follow-up appointment in radiation oncology. Patient's partner [name] answered and gave update on patient's behalf.   Given the state of the  COVID-19 pandemic, concerning case numbers in our community, and guidance from [facility], I offered a phone assessment to determine if coming to the clinic was necessary.   I let the patient know that I had spoken with Dr. Isidore Moos, and she wanted them to know the importance of washing their hands for at least 20 seconds at a time, especially after going out in public, and before they eat. Limit going out in public whenever possible. Do not touch your face, unless your hands are clean, such as when bathing. Get plenty of rest, eat well, and stay hydrated. Cindy verbalized understanding and agreement.  Symptomatically, the patient is doing relatively well. [name] reports Jhane is tolerating her chemotherapy treatments well, and has no current complaints besides some lingering issues with her stoma. [name] reports that Shadi has an appointment with Dr. Conley Canal at [facility] on 07/12/2020 to address issue. Otherwise [name] reports Nafisah is doing well and will be pleased to not have to come into the clinic for follow-up.  All questions were answered to the patient's satisfaction.  I encouraged the patient to call with any further questions. Otherwise, the plan is continue treatment under guidance of Dr. Alvy Bimler and reach out to radiation department as needed.    [name] reports patient will be pleased with this plan, and we will cancel their upcoming follow-up to reduce the risk of COVID-19 transmission.

## 2020-07-07 ENCOUNTER — Ambulatory Visit: Payer: Medicare HMO | Admitting: Radiation Oncology

## 2020-07-07 ENCOUNTER — Encounter: Payer: Self-pay | Admitting: Hematology and Oncology

## 2020-07-07 ENCOUNTER — Inpatient Hospital Stay: Payer: Medicare HMO | Attending: Hematology

## 2020-07-07 ENCOUNTER — Inpatient Hospital Stay: Payer: Medicare HMO

## 2020-07-07 ENCOUNTER — Inpatient Hospital Stay (HOSPITAL_BASED_OUTPATIENT_CLINIC_OR_DEPARTMENT_OTHER): Payer: Medicare HMO | Admitting: Hematology and Oncology

## 2020-07-07 ENCOUNTER — Other Ambulatory Visit: Payer: Self-pay

## 2020-07-07 DIAGNOSIS — C329 Malignant neoplasm of larynx, unspecified: Secondary | ICD-10-CM

## 2020-07-07 DIAGNOSIS — M25519 Pain in unspecified shoulder: Secondary | ICD-10-CM | POA: Insufficient documentation

## 2020-07-07 DIAGNOSIS — K089 Disorder of teeth and supporting structures, unspecified: Secondary | ICD-10-CM | POA: Diagnosis not present

## 2020-07-07 DIAGNOSIS — Z923 Personal history of irradiation: Secondary | ICD-10-CM | POA: Diagnosis not present

## 2020-07-07 DIAGNOSIS — L598 Other specified disorders of the skin and subcutaneous tissue related to radiation: Secondary | ICD-10-CM | POA: Diagnosis not present

## 2020-07-07 DIAGNOSIS — G893 Neoplasm related pain (acute) (chronic): Secondary | ICD-10-CM

## 2020-07-07 DIAGNOSIS — C321 Malignant neoplasm of supraglottis: Secondary | ICD-10-CM | POA: Diagnosis present

## 2020-07-07 DIAGNOSIS — Y842 Radiological procedure and radiotherapy as the cause of abnormal reaction of the patient, or of later complication, without mention of misadventure at the time of the procedure: Secondary | ICD-10-CM | POA: Diagnosis not present

## 2020-07-07 DIAGNOSIS — Z9221 Personal history of antineoplastic chemotherapy: Secondary | ICD-10-CM | POA: Insufficient documentation

## 2020-07-07 DIAGNOSIS — Z5112 Encounter for antineoplastic immunotherapy: Secondary | ICD-10-CM | POA: Insufficient documentation

## 2020-07-07 DIAGNOSIS — B370 Candidal stomatitis: Secondary | ICD-10-CM | POA: Diagnosis not present

## 2020-07-07 DIAGNOSIS — Z930 Tracheostomy status: Secondary | ICD-10-CM | POA: Diagnosis not present

## 2020-07-07 DIAGNOSIS — Z79899 Other long term (current) drug therapy: Secondary | ICD-10-CM | POA: Insufficient documentation

## 2020-07-07 DIAGNOSIS — Z87891 Personal history of nicotine dependence: Secondary | ICD-10-CM | POA: Insufficient documentation

## 2020-07-07 DIAGNOSIS — E039 Hypothyroidism, unspecified: Secondary | ICD-10-CM

## 2020-07-07 DIAGNOSIS — Z95828 Presence of other vascular implants and grafts: Secondary | ICD-10-CM

## 2020-07-07 LAB — COMPREHENSIVE METABOLIC PANEL
ALT: 24 U/L (ref 0–44)
AST: 22 U/L (ref 15–41)
Albumin: 3.9 g/dL (ref 3.5–5.0)
Alkaline Phosphatase: 76 U/L (ref 38–126)
Anion gap: 6 (ref 5–15)
BUN: 23 mg/dL (ref 8–23)
CO2: 31 mmol/L (ref 22–32)
Calcium: 9.1 mg/dL (ref 8.9–10.3)
Chloride: 103 mmol/L (ref 98–111)
Creatinine, Ser: 0.73 mg/dL (ref 0.44–1.00)
GFR, Estimated: >60 mL/min (ref >60)
Glucose, Bld: 98 mg/dL (ref 70–99)
Potassium: 4.3 mmol/L (ref 3.5–5.1)
Sodium: 140 mmol/L (ref 135–145)
Total Bilirubin: 0.3 mg/dL (ref 0.3–1.2)
Total Protein: 7.3 g/dL (ref 6.5–8.1)

## 2020-07-07 LAB — CBC WITH DIFFERENTIAL/PLATELET
Abs Immature Granulocytes: 0.02 10*3/uL (ref 0.00–0.07)
Basophils Absolute: 0 10*3/uL (ref 0.0–0.1)
Basophils Relative: 0 %
Eosinophils Absolute: 0.3 10*3/uL (ref 0.0–0.5)
Eosinophils Relative: 6 %
HCT: 39.2 % (ref 36.0–46.0)
Hemoglobin: 13 g/dL (ref 12.0–15.0)
Immature Granulocytes: 0 %
Lymphocytes Relative: 23 %
Lymphs Abs: 1.1 10*3/uL (ref 0.7–4.0)
MCH: 29.1 pg (ref 26.0–34.0)
MCHC: 33.2 g/dL (ref 30.0–36.0)
MCV: 87.9 fL (ref 80.0–100.0)
Monocytes Absolute: 0.6 10*3/uL (ref 0.1–1.0)
Monocytes Relative: 12 %
Neutro Abs: 2.8 10*3/uL (ref 1.7–7.7)
Neutrophils Relative %: 59 %
Platelets: 204 10*3/uL (ref 150–400)
RBC: 4.46 MIL/uL (ref 3.87–5.11)
RDW: 13.4 % (ref 11.5–15.5)
WBC: 4.8 10*3/uL (ref 4.0–10.5)
nRBC: 0 % (ref 0.0–0.2)

## 2020-07-07 LAB — TSH: TSH: 1.528 u[IU]/mL (ref 0.308–3.960)

## 2020-07-07 LAB — T4, FREE: Free T4: 0.99 ng/dL (ref 0.61–1.12)

## 2020-07-07 MED ORDER — SODIUM CHLORIDE 0.9% FLUSH
10.0000 mL | Freq: Once | INTRAVENOUS | Status: AC
  Administered 2020-07-07: 10 mL
  Filled 2020-07-07: qty 10

## 2020-07-07 MED ORDER — SODIUM CHLORIDE 0.9% FLUSH
10.0000 mL | INTRAVENOUS | Status: DC | PRN
  Administered 2020-07-07: 10 mL
  Filled 2020-07-07: qty 10

## 2020-07-07 MED ORDER — SODIUM CHLORIDE 0.9 % IV SOLN
Freq: Once | INTRAVENOUS | Status: AC
  Filled 2020-07-07: qty 250

## 2020-07-07 MED ORDER — SODIUM CHLORIDE 0.9 % IV SOLN
200.0000 mg | Freq: Once | INTRAVENOUS | Status: AC
  Administered 2020-07-07: 200 mg via INTRAVENOUS
  Filled 2020-07-07: qty 8

## 2020-07-07 MED ORDER — HEPARIN SOD (PORK) LOCK FLUSH 100 UNIT/ML IV SOLN
500.0000 [IU] | Freq: Once | INTRAVENOUS | Status: AC | PRN
  Administered 2020-07-07: 500 [IU]
  Filled 2020-07-07: qty 5

## 2020-07-07 NOTE — Assessment & Plan Note (Signed)
She has very poor dentition, likely exacerbated from prior radiation therapy Unfortunately, given her medical illness and situation, she is not a candidate right now for complete dental extraction

## 2020-07-07 NOTE — Assessment & Plan Note (Signed)
Her recent imaging studies showed stable disease control with single agent pembrolizumab The plan would be to continue treatment for 3 more months before repeating another CT imaging due at the end of the year

## 2020-07-07 NOTE — Progress Notes (Signed)
[facility] OFFICE PROGRESS NOTE  Patient Care Team: Hamrick, Lorin Mercy, MD as PCP - General (Family Medicine) Eppie Gibson, MD as Attending Physician (Radiation Oncology) Leota Sauers, RN (Inactive) as Oncology Nurse Navigator Karie Mainland, RD as Dietitian (Nutrition) Sharen Counter, CCC-SLP as Speech Language Pathologist (Speech Pathology) Malmfelt, Stephani Police, RN as Oncology Nurse Navigator (Oncology)  ASSESSMENT & PLAN:  Laryngeal squamous cell carcinoma [facility]) Her recent imaging studies showed stable disease control with single agent pembrolizumab The plan would be to continue treatment for 3 more months before repeating another CT imaging due at the end of the year  Cancer associated pain Her pain is well controlled We discussed narcotic refill policy    Oral thrush She has recurrent oral thrush I recommend a course of fluconazole  Poor dentition She has very poor dentition, likely exacerbated from prior radiation therapy Unfortunately, given her medical illness and situation, she is not a candidate right now for complete dental extraction  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   All questions were answered. The patient knows to call the clinic with any problems, questions or concerns. The total time spent in the appointment was 30 minutes encounter with patients including review of chart and various tests results, discussions about plan of care and coordination of care plan   Heath Lark, MD 07/07/2020 10:41 AM  INTERVAL HISTORY: Please see below for problem oriented charting. She returns for treatment and follow-up Her son is not available We communicate through gestures and writing She denies mouth  pain or dysphagia No recent fever or chills Her chronic pain is stable and she is attempting to cut some of her pain medicine by half No recent nausea or changes in bowel habits She has been evaluated by physical therapy and rehab with plan to undergo intensive therapy to help with her shoulder mobility and fibrosis from radiation  SUMMARY OF ONCOLOGIC HISTORY: Oncology History  Laryngeal squamous cell carcinoma ([place])  12/02/2018 Imaging   CT neck: IMPRESSION: 1. Supraglottic laryngeal carcinoma extending across the anterior commissure and invading the prelaryngeal fat, left aryepiglottic fold and left aspect of the epiglottis. 2. Severe narrowing of the laryngeal airway. 3. Bilateral subcentimeter level 2A and 2A cervical lymph nodes.     12/02/2018 Imaging   CT  Chest:  IMPRESSION: No evidence of metastatic disease in the chest.   12/03/2018 Pathology Results   (Care Everywhere; [facility]) A: Larynx, supraglottis, biopsy - Atypical squamous proliferation with at least low grade dysplasia and abundant keratinization - No definite high grade dysplasia or invasive carcinoma represented (see comment)   12/18/2018 Pathology Results   (Care Everywhere; [facility]) A:  Neck, left, fine needle aspiration - Rare single atypical cells, no cells diagnostic of malignancy - Lymph node material present  B:  Neck, right, fine needle aspiration - Non-diagnostic specimen - No lymph node material identified - Mixed inflammation and blood   12/31/2018 Pathology Results   (Care Everywhere; [facility])  A: Larynx, left supraglottic mass, biopsy - Atypical squamous proliferation with abundant keratinization, suspicious for well-differentiated invasive squamous cell carcinoma  B: Larynx, left supraglottic mass, biopsy - Well-differentiated invasive squamous cell carcinoma with abundant keratinization (see comment)   01/18/2019 Initial Diagnosis  Laryngeal cancer ([place])   01/26/2019 Imaging    1. Locally advanced hypermetabolic laryngeal neoplasm centered in the left supraglottic space with bilateral glottic and epiglottic involvement and subglottic extension on the left. 2. Hypermetabolic bilateral level 3 neck nodal metastases. 3. Hypermetabolic high mediastinal nodal metastases between the trachea and upper thoracic esophagus. 4. Otherwise no distant hypermetabolic metastatic disease. 5. Nodular 7 mm focus at the left lung base, below PET resolution, recommend attention on follow-up chest CT in 3 months. 6. Chronic findings include: Aortic Atherosclerosis (ICD10-I70.0). Cholelithiasis.   02/08/2019 Cancer Staging   Staging form: Larynx - Supraglottis, AJCC 8th Edition - Clinical stage from 02/08/2019: Stage IVA (cT3, cN2c, cM0) - Signed by Eppie Gibson, MD on 02/08/2019   02/11/2019 - 04/07/2019 Chemotherapy   The patient weekly cisplatin for chemotherapy treatment.     07/20/2019 Pathology Results   PROCEDURE: Total glossectomy, total pharyngectomy, total laryngectomy, total thyroidectomy, and cervical esophagectomy TUMOR SITE: Larynx TUMOR LATERALITY: Not specified TUMOR FOCALITY: Unifocal TUMOR SIZE:    GREATEST DIMENSION: 6.9 cm    ADDITIONAL DIMENSIONS: 5.4 x 2.4 cm HISTOLOGIC TYPE: Squamous cell carcinoma, conventional (keratinizing) HISTOLOGIC GRADE:  G1-G2: Well to moderately differentiated TUMOR EXTENSION: Tumor involves the posterior tongue, pharynx, larynx, tracheal cartilage, hyoid bone, cervical esophagus, vagus nerve, carotid artery, deep cervical fascia, ans surrounding skeletal muscle and soft tissue MARGINS: Uninvolved by invasive tumor; Perineural invasion is less than 25mm from the left soft tissue margin LYMPHOVASCULAR INVASION: Not identified PERINEURAL INVASION:  Present REGIONAL LYMPH NODES:     NUMBER OF LYMPH NODES INVOLVED: 2     NUMBER OF LYMPH NODES EXAMINED: 24     LATERALITY OF LYMPH NODES INVOLVED: Cannot determine     SIZE OF LARGEST  METASTATIC DEPOSIT: 0.8 cm     EXTRANODAL EXTENSION: Not identified PATHOLOGIC STAGE CLASSIFICATION (pTNM, AJCC 8TH Ed): (r)(y) pT4b pN2a DISTANT METASTASIS (pM): ADDITIONAL PATHOLOGIC FINDINGS: Previous chemoradiation   11/04/2019 Imaging   PET: 1.  Hypermetabolic bilateral neck lesions as detailed above are concerning for disease recurrence. 2.  Compared to 07/08/2019 PET scan, interval development of subcentimeter bilateral upper lobe pulmonary nodules, size below PET resolution. These are concerning for metastasis. Consider diagnostic quality CT chest exam for further evaluation as per oncology treatment planning. 3.  Focal hypermetabolic uptake in right ventricular cavity of indeterminate significance. Recommend echocardiographic examination to rule out underlying thrombus. This finding was discussed with Dr. Conley Canal by Dr. Clementeen Graham and Dr. Marcello Moores on 11/04/2019 at approximately 3:00 PM via telephone. 4.  Ancillary CT findings as above.   11/05/2019 Imaging   Echocardiogram: Summary   1. The left ventricle is normal in size with normal wall thickness.   2. The left ventricular systolic function is normal, LVEF is visually estimated at 60-65%.   3. The right ventricle is normal in size, with normal systolic function.   4. There is an extrinsic compression of the RV and RA laterally at the AV groove by a mass of 4.3 cm diameter. Unclear if the mass is invasive, although no pericardial effusion seen suggestive no invasion possibly. There is no evidence of hemodynamic compromise secondary to cardiac compression with normal IVC diameter.   12/06/2019 Pathology Results   A. LYMPH NODE, LEFT SUBMANDIBULAR, BIOPSY:  - Squamous cell carcinoma.  - No distinct nodal tissue identified.    12/06/2019 Procedure   Successful ultrasound-guided core biopsy of centrally necrotic left submandibular lymph node. Of note, due to extensive prior postsurgical and post radiation changes, patient  has very  limited mobility in the neck and the biopsy was extremely challenging    12/16/2019 -  Chemotherapy   The patient had pembrolizumab for chemotherapy treatment.     03/08/2020 PET scan   1. Diminished size of masses in the neck as described. Persistent increased metabolic activity particularly in the LEFT neck. Some of this diminished size on the LEFT could be due to decompression of necrotic material. Correlate with any symptoms of inflammation or infection in this area as well given the paucity of residual soft tissue associated with the above abnormality, overall findings are compatible with residual disease in this location and there is no significant surrounding stranding to indicate inflammation that would correlate with the degree of FDG uptake remaining. 2. Diminished activity in the juxta cardiac mass with peripheral activity suggesting interval necrosis, on the prior study this was uniformly hypermetabolic. There is still considerable FDG uptake. 3. No signs of disease in the abdomen or pelvis. 4. 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 Correlate with any new pain or symptoms in this area with further imaging as warranted. No discrete mass or bone destruction on today's study.   06/08/2020 Imaging   CT neck 1. Status post laryngectomy and neck dissection without lymphadenopathy or mass lesion. 2. Short segment severe stenosis of the left internal carotid artery adjacent to a surgical clip.     06/08/2020 Imaging   CT chest 1. There is a large, hypodense lesion within, or very closely abutting and likely intruding upon the right ventricle, as seen on prior examination and approximately 4.6 x 4.3 cm, poorly evaluated due to cardiac motion. Findings are consistent with metastatic lesion and/or thrombus and poorly assessed by ungated CT. Consider echocardiography and cardiac MRI for further assessment.   2. Multiple small pulmonary nodules in the bilateral lung apices are unchanged.  Attention on follow-up.   3.  No evidence of new metastatic disease in the chest.   4.  Aortic Atherosclerosis (ICD10-I70.0).       REVIEW OF SYSTEMS:   Constitutional: Denies fevers, chills or abnormal weight loss Eyes: Denies blurriness of vision Respiratory: Denies cough, dyspnea or wheezes Cardiovascular: Denies palpitation, chest discomfort or lower extremity swelling Gastrointestinal:  Denies nausea, heartburn or change in bowel habits Skin: Denies abnormal skin rashes Lymphatics: Denies new lymphadenopathy or easy bruising Neurological:Denies numbness, tingling or new weaknesses Behavioral/Psych: Mood is stable, no new changes  All other systems were reviewed with the patient and are negative.  I have reviewed the past medical history, past surgical history, social history and family history with the patient and they are unchanged from previous note.  ALLERGIES:  has No Known Allergies.  MEDICATIONS:  Current Outpatient Medications  Medication Sig Dispense Refill  . chlorhexidine (PERIDEX) 0.12 % solution Rinse with 15 mls twice daily for 30 seconds. Use after breakfast and at bedtime. Spit out excess. Do not swallow. 480 mL 3  . HYDROcodone-acetaminophen (NORCO) 5-325 MG tablet Take 1 tablet by mouth every 8 (eight) hours as needed for moderate pain. 60 tablet 0  . levothyroxine (SYNTHROID) 137 MCG tablet Take 137 mcg by mouth daily before breakfast.    . lidocaine-prilocaine (EMLA) cream Apply to affected area once 30 g 3  . Nutritional Supplements (KATE FARMS PEPTIDE 1.5) LIQD Take 4 Bottles by mouth daily. 325 mL 11  . ondansetron (ZOFRAN) 8 MG tablet Take 1 tablet (8 mg total) by mouth 2 (two) times daily as needed (Nausea or vomiting). 30 tablet 1  .  prochlorperazine (COMPAZINE) 10 MG tablet Take 1 tablet (10 mg total) by mouth every 6 (six) hours as needed (Nausea or vomiting). 30 tablet 1  . sodium fluoride (PREVIDENT 5000 PLUS) 1.1 % CREA dental cream Apply cream to  tooth brush. Brush teeth for 2 minutes. Spit out excess. DO NOT rinse afterwards. Repeat nightly. (Patient taking differently: Place 1 application onto teeth 2 (two) times a day. ) 1 Tube prn   No current facility-administered medications for this visit.    PHYSICAL EXAMINATION: ECOG PERFORMANCE STATUS: 1 - Symptomatic but completely ambulatory  Vitals:   07/07/20 1033  BP: 105/65  Pulse: 77  Resp: 18  Temp: 97.7 F (36.5 C)  SpO2: 98%   Filed Weights   07/07/20 1033  Weight: 137 lb (62.1 kg)    GENERAL:alert, no distress and comfortable SKIN: skin color, texture, turgor are normal, no rashes or significant lesions EYES: normal, Conjunctiva are pink and non-injected, sclera clear OROPHARYNX: Noted very poor dentition.  Oral thrush NECK: Tracheostomy looks okay LYMPH:  no palpable lymphadenopathy in the cervical, axillary or inguinal LUNGS: clear to auscultation and percussion with normal breathing effort HEART: regular rate & rhythm and no murmurs and no lower extremity edema ABDOMEN:abdomen soft, non-tender and normal bowel sounds.  Feeding tube site looks okay Musculoskeletal:no cyanosis of digits and no clubbing  NEURO: alert   LABORATORY DATA:  I have reviewed the data as listed    Component Value Date/Time   NA 138 06/08/2020 1303   K 4.0 06/08/2020 1303   CL 103 06/08/2020 1303   CO2 27 06/08/2020 1303   GLUCOSE 93 06/08/2020 1303   BUN 25 (H) 06/08/2020 1303   CREATININE 0.68 06/08/2020 1303   CREATININE 0.60 02/17/2020 1128   CALCIUM 9.0 06/08/2020 1303   PROT 7.2 06/08/2020 1303   ALBUMIN 3.9 06/08/2020 1303   AST 24 06/08/2020 1303   AST 22 02/17/2020 1128   ALT 27 06/08/2020 1303   ALT 24 02/17/2020 1128   ALKPHOS 73 06/08/2020 1303   BILITOT 0.3 06/08/2020 1303   BILITOT 0.5 02/17/2020 1128   GFRNONAA >60 06/08/2020 1303   GFRNONAA >60 02/17/2020 1128   GFRAA >60 06/08/2020 1303   GFRAA >60 02/17/2020 1128    No results found for: SPEP,  UPEP  Lab Results  Component Value Date   WBC 4.8 07/07/2020   NEUTROABS 2.8 07/07/2020   HGB 13.0 07/07/2020   HCT 39.2 07/07/2020   MCV 87.9 07/07/2020   PLT 204 07/07/2020      Chemistry      Component Value Date/Time   NA 138 06/08/2020 1303   K 4.0 06/08/2020 1303   CL 103 06/08/2020 1303   CO2 27 06/08/2020 1303   BUN 25 (H) 06/08/2020 1303   CREATININE 0.68 06/08/2020 1303   CREATININE 0.60 02/17/2020 1128      Component Value Date/Time   CALCIUM 9.0 06/08/2020 1303   ALKPHOS 73 06/08/2020 1303   AST 24 06/08/2020 1303   AST 22 02/17/2020 1128   ALT 27 06/08/2020 1303   ALT 24 02/17/2020 1128   BILITOT 0.3 06/08/2020 1303   BILITOT 0.5 02/17/2020 1128       RADIOGRAPHIC STUDIES: I have personally reviewed the radiological images as listed and agreed with the findings in the report. CT SOFT TISSUE NECK W CONTRAST  Result Date: 06/09/2020 CLINICAL DATA:  The original carcinoma status post chemotherapy and radiation. EXAM: CT NECK WITH CONTRAST TECHNIQUE: Multidetector CT imaging of  the neck was performed using the standard protocol following the bolus administration of intravenous contrast. CONTRAST:  63mL OMNIPAQUE IOHEXOL 300 MG/ML  SOLN COMPARISON:  CT neck 06/03/2019 FINDINGS: Pharynx and larynx: Status post laryngectomy and neck dissection. There is a latissimus dorsi flap. Salivary glands: Parotid glands are normal. Absent submandibular glands. Thyroid: Surgically absent Lymph nodes: Bilateral neck dissections.  No lymphadenopathy. Vascular: Short segment severe stenosis of the left internal carotid artery adjacent to a surgical clip (series 2, image 36) Limited intracranial: Normal Visualized orbits: Bilateral ocular lens replacements. Mastoids and visualized paranasal sinuses: Clear Skeleton: No acute or aggressive process. Upper chest: Please refer to dedicated report for CT of the chest. Other: None IMPRESSION: 1. Status post laryngectomy and neck dissection  without lymphadenopathy or mass lesion. 2. Short segment severe stenosis of the left internal carotid artery adjacent to a surgical clip. Electronically Signed   By: Ulyses Jarred M.D.   On: 06/09/2020 00:03   CT CHEST W CONTRAST  Result Date: 06/09/2020 CLINICAL DATA:  Head and neck cancer, assess treatment response EXAM: CT CHEST WITH CONTRAST TECHNIQUE: Multidetector CT imaging of the chest was performed during intravenous contrast administration. CONTRAST:  68mL OMNIPAQUE IOHEXOL 300 MG/ML  SOLN COMPARISON:  PET-CT, 03/08/2020, CT chest, 06/03/2019 FINDINGS: Cardiovascular: Right chest port catheter. Normal heart size. There is a large, hypodense within, or very closely abutting and likely intruding upon the right ventricle as seen on prior examination and approximately 4.6 x 4.3 cm, poorly evaluated due to cardiac motion (series 3, image 42). No pericardial effusion. Aortic atherosclerosis. Mediastinum/Nodes: No enlarged mediastinal, hilar, or axillary lymph nodes. Tracheostomy. Thyroid gland, trachea, and esophagus demonstrate no other significant findings. Lungs/Pleura: Multiple small pulmonary nodules in the bilateral lung apices are unchanged, the largest a 6 mm nodule of the anterior left apex (series 6, image 28). No new nodules. No pleural effusion or pneumothorax. Upper Abdomen: No acute abnormality. Partially imaged percutaneous gastrostomy. Musculoskeletal: No chest wall mass or suspicious bone lesions identified. IMPRESSION: 1. There is a large, hypodense lesion within, or very closely abutting and likely intruding upon the right ventricle, as seen on prior examination and approximately 4.6 x 4.3 cm, poorly evaluated due to cardiac motion. Findings are consistent with metastatic lesion and/or thrombus and poorly assessed by ungated CT. Consider echocardiography and cardiac MRI for further assessment. 2. Multiple small pulmonary nodules in the bilateral lung apices are unchanged. Attention on  follow-up. 3.  No evidence of new metastatic disease in the chest. 4.  Aortic Atherosclerosis (ICD10-I70.0). Electronically Signed   By: Eddie Candle M.D.   On: 06/09/2020 10:05

## 2020-07-07 NOTE — Assessment & Plan Note (Signed)
Her pain is well controlled We discussed narcotic refill policy

## 2020-07-07 NOTE — Assessment & Plan Note (Signed)
She has recurrent oral thrush I recommend a course of fluconazole

## 2020-07-07 NOTE — Patient Instructions (Signed)
[facility] Discharge Instructions for Patients Receiving Chemotherapy  Today you received the following chemotherapy agents: pembrolizumab.  To help prevent nausea and vomiting after your treatment, we encourage you to take your nausea medication as directed.   If you develop nausea and vomiting that is not controlled by your nausea medication, call the clinic.   BELOW ARE SYMPTOMS THAT SHOULD BE REPORTED IMMEDIATELY:  *FEVER GREATER THAN 100.5 F  *CHILLS WITH OR WITHOUT FEVER  NAUSEA AND VOMITING THAT IS NOT CONTROLLED WITH YOUR NAUSEA MEDICATION  *UNUSUAL SHORTNESS OF BREATH  *UNUSUAL BRUISING OR BLEEDING  TENDERNESS IN MOUTH AND THROAT WITH OR WITHOUT PRESENCE OF ULCERS  *URINARY PROBLEMS  *BOWEL PROBLEMS  UNUSUAL RASH Items with * indicate a potential emergency and should be followed up as soon as possible.  Feel free to call the clinic should you have any questions or concerns. The clinic phone number is [phone].  Please show the CHEMO ALERT CARD at check-in to the Emergency Department and triage nurse.   

## 2020-07-07 NOTE — Patient Instructions (Signed)

## 2020-07-07 NOTE — Assessment & Plan Note (Signed)
She has significant shoulder pain and limited mobility due to prior radiation I have referred her to get physical therapy and rehab She will continue the same

## 2020-07-10 ENCOUNTER — Ambulatory Visit: Payer: Medicare HMO

## 2020-07-10 ENCOUNTER — Other Ambulatory Visit: Payer: Self-pay

## 2020-07-10 ENCOUNTER — Other Ambulatory Visit: Payer: Self-pay | Admitting: Hematology and Oncology

## 2020-07-10 DIAGNOSIS — C329 Malignant neoplasm of larynx, unspecified: Secondary | ICD-10-CM | POA: Diagnosis not present

## 2020-07-10 DIAGNOSIS — C321 Malignant neoplasm of supraglottis: Secondary | ICD-10-CM

## 2020-07-10 DIAGNOSIS — L599 Disorder of the skin and subcutaneous tissue related to radiation, unspecified: Secondary | ICD-10-CM | POA: Diagnosis not present

## 2020-07-10 DIAGNOSIS — R293 Abnormal posture: Secondary | ICD-10-CM | POA: Diagnosis not present

## 2020-07-10 DIAGNOSIS — G8929 Other chronic pain: Secondary | ICD-10-CM

## 2020-07-10 DIAGNOSIS — M25612 Stiffness of left shoulder, not elsewhere classified: Secondary | ICD-10-CM

## 2020-07-10 DIAGNOSIS — M25512 Pain in left shoulder: Secondary | ICD-10-CM | POA: Diagnosis not present

## 2020-07-10 DIAGNOSIS — R262 Difficulty in walking, not elsewhere classified: Secondary | ICD-10-CM | POA: Diagnosis not present

## 2020-07-10 MED ORDER — HYDROCODONE-ACETAMINOPHEN 5-325 MG PO TABS
1.0000 | ORAL_TABLET | Freq: Three times a day (TID) | ORAL | 0 refills | Status: DC | PRN
Start: 2020-07-10 — End: 2020-07-28

## 2020-07-10 NOTE — Patient Instructions (Signed)
Scapular Retraction: Bilateral    Facing anchor, pull arms back, bringing shoulder blades together. Repeat __5-10__ times per set. Do __2__ sets per session. Hold __5__ seconds.  Then close one knot in door and holding other knot in hand have arm bent at side and slowly step away from door keeping arm in same position.  Repeat 5 times.  DO NOT PUSH PAIN WITH EITHER!  Cancer Rehab [phone]

## 2020-07-10 NOTE — Therapy (Signed)
[place], [place], [address] Phone: [phone]   Fax:  [phone]  Physical Therapy Treatment  Patient Details  Name: [name] [name] MRN: [id number] Date of Birth: 1951-03-07 Referring Provider (PT): Alvy Bimler   Encounter Date: 07/10/2020   PT End of Session - 07/10/20 1629    Visit Number 2    Number of Visits 9    Date for PT Re-Evaluation 07/31/20    PT Start Time 1506    PT Stop Time 1611    PT Time Calculation (min) 65 min    Activity Tolerance Patient tolerated treatment well    Behavior During Therapy [facility] for tasks assessed/performed           Past Medical History:  Diagnosis Date  . Cataract   . History of radiation therapy 02/11/19- 04/06/19   bilateral neck nodes and upper mediastinum  . Laryngeal cancer ([place]) 11/2018  . Thyroid disease     Past Surgical History:  Procedure Laterality Date  . CATARACT EXTRACTION, BILATERAL  left 05/20/16, right 06/03/16   Cataract Extraction w/ IOL Implant; Surgeon: Sudie Grumbling, MD; Location: [facility]; Service: Ophthalmology   . CESAREAN SECTION    . ESOPHAGOGASTRODUODENOSCOPY (EGD) WITH PROPOFOL N/A 02/01/2019   Procedure: ESOPHAGOGASTRODUODENOSCOPY (EGD) WITH PROPOFOL;  Surgeon: Milus Banister, MD;  [facility];  Service: Endoscopy;  Laterality: N/A;  . EUS N/A 02/01/2019   Procedure: UPPER ENDOSCOPIC ULTRASOUND (EUS) RADIAL-Will need Linear and Forward Viewing EUS Scope;  Surgeon: Milus Banister, MD;  [facility];  Service: Endoscopy;  Laterality: N/A;  . FINE NEEDLE ASPIRATION N/A 02/01/2019   Procedure: FINE NEEDLE ASPIRATION (FNA) LINEAR;  Surgeon: Milus Banister, MD;  [facility];  Service: Endoscopy;  Laterality: N/A;  . IR GASTROSTOMY TUBE MOD SED  02/09/2019  . IR IMAGING GUIDED PORT INSERTION  02/02/2019  . IR PATIENT EVAL TECH 0-60 MINS  01/06/2020  . IR RADIOLOGIST EVAL & MGMT  04/10/2020  . KNEE  CARTILAGE SURGERY Right 2019  . TRACHEOSTOMY  12/02/2018   [facility]    There were no vitals filed for this visit.   Subjective Assessment - 07/10/20 1508    Subjective Pt indicates Lt shoulder stabbing feeling today.    Pertinent History cancer of larynx and supraglottis; 12/03/18- larynx, supraglottis biopsy, chemo from May 20- July 20, 10/27-Total glossectomy, total pharyngectomy, totallaryngectomy, total thyroidectomy, and cervical esophagectomy,TUMOR EXTENSION: Tumor involves the posterior tongue, pharynx,larynx, tracheal cartilage, hyoid bone, cervical esophagus, vagusnerve, carotid artery, deep cervical fascia, ans surroundingskeletal muscle and soft tissue. Pt completed radiaiton.    Currently in Pain? Yes    Pain Score 6     Pain Location Shoulder    Pain Orientation Left    Pain Descriptors / Indicators Stabbing    Pain Type Surgical pain    Pain Onset Other (comment)   more than a year   Pain Frequency Intermittent   only aches when at rest   Aggravating Factors  washing dishes    Pain Relieving Factors heating pad, rest                             [facility] Adult PT Treatment/Exercise - 07/10/20 0001      Shoulder Exercises: Standing   External Rotation Strengthening;Left;5 reps;Theraband   holding theraband and walk away from door   External Rotation Limitations pt with pain at end of walk  outs so instructed to stop before pain, nodded head for understanding    Retraction Strengthening;Both;10 reps;Theraband    Retraction Limitations Pt returned therapist demo and tactile cues for correct motion and muscle engagement, also to decrease bil shoulder hiking      Shoulder Exercises: Pulleys   Flexion 2 minutes    Flexion Limitations Pt returned therapist demo and then tactile cues for correct scapular rhythm    Scaption 1 minute    Scaption Limitations Pt returned therapist demo and tactile cues to stop AA/ROM when unable to relax muscles. Pt able to  return good demo of this      Manual Therapy   Manual Therapy Soft tissue mobilization;Scapular mobilization;Passive ROM    Soft tissue mobilization With coconut oil: initally in supine for STM to anterior shoulder and upper arm where multiplt trigger points palpable, then in Rt S/L for STM to Lt upper trap, medial and lateral scapular border trigger point release; also gentle scar tissue massage over entire scar at lower Lt posterior trunk    Scapular Mobilization When in Rt S/L to Lt scapula into retraction and protraction gently and to pts tolerance; very tight initally with limited mobility and increased TTP but mobility improved some by end of session as did tenderness    Passive ROM Briefly in supine into flexion and abduction but pt with difficulty relaxing so stopped to continue focusing on STM                  PT Education - 07/10/20 1619    Education Details Scapular retraction and walk outs for Lt shoulder er isometric strengthening with yellow theraband    Person(s) Educated Patient    Methods Explanation;Demonstration;Handout    Comprehension Verbalized understanding;Returned demonstration;Need further instruction;Tactile cues required               PT Long Term Goals - 07/03/20 1101      PT LONG TERM GOAL #1   Title Pt will report she is able to sleep through the night and not wake up due to pain in L shoulder    Baseline has difficulty sleeping due to pain    Time 4    Period Weeks    Status New    Target Date 07/31/20      PT LONG TERM GOAL #2   Title Pt will be able to prepare meals without being limited by her left shoulder ROM or pain    Time 4    Period Weeks    Status New    Target Date 07/31/20      PT LONG TERM GOAL #3   Title Pt will be independent in a home exercise program for all over strengthening and stretching for entire body.    Time 4    Period Weeks    Status New    Target Date 07/31/20      PT LONG TERM GOAL #4   Title Pt will  demonstrate 90 degrees of left shoulder flexion and abduction to allow her to complete ADLs more easily.    Baseline flex: 61; abd: 56    Time 4    Period Weeks    Status New    Target Date 07/31/20                 Plan - 07/10/20 1629    Clinical Impression Statement Focsued most of session oon manual therapy working to decrease tenderness, improve scapular mobility and decrease overall  muscle tightness. Some trigger point release was noted at medial scapular border but pt very tight and tender here. After manual therapy pt was able to demonstrate improved A/ROM of Lt shoulder flex and abd without having to push her arm up with Rt UE. She was able to report less pain. Also began pulleys instructing pt in proper technique as she reports having these at home and using them daily. After demo and tactile and VCs she was able to return correct demo and encouraged her to cont using pulleys in this manner at home. Also progressed HEP to include gentle scapular strength with yellow theraband scapular retraction and er walk outs. She had some pain at end motions of both so instructed her to stop before pain increases and she was able to return demo and of this and nod understanding. Pt used note pad and paper to answer questions throughout session.    Personal Factors and Comorbidities Time since onset of injury/illness/exacerbation;Fitness    Examination-Activity Limitations Lift;Reach Overhead;Carry;Sleep    Examination-Participation Restrictions Yard Work;Driving;Community Activity;Meal Prep;Cleaning;Laundry    Stability/Clinical Decision Making Evolving/Moderate complexity    Rehab Potential Good    PT Frequency 2x / week    PT Duration 4 weeks    PT Treatment/Interventions ADLs/Self Care Home Management;Iontophoresis 4mg /ml Dexamethasone;Therapeutic activities;Therapeutic exercise;Neuromuscular re-education;Patient/family education;Manual techniques;Scar mobilization;Passive range of  motion;Taping    PT Next Visit Plan Cont gentle soft tissue work to entire left posterior shoulder and scapular muscles, do scapular mobilization, PROM L shoulder; do 30 sec sit to stand and add goal for this to work on overall strength since pt had recent fall    PT Home Exercise Plan Cont with her pulleys at home; yellow theraband for scap retract and er walk outs    Consulted and Agree with Plan of Care Patient           Patient will benefit from skilled therapeutic intervention in order to improve the following deficits and impairments:  Impaired flexibility, Decreased balance, Decreased activity tolerance, Decreased endurance, Decreased range of motion, Decreased strength, Impaired UE functional use, Postural dysfunction, Decreased scar mobility, Increased fascial restricitons, Pain  Visit Diagnosis: Chronic left shoulder pain  Stiffness of left shoulder, not elsewhere classified  Abnormal posture  Disorder of the skin and subcutaneous tissue related to radiation, unspecified  Difficulty in walking, not elsewhere classified  Malignant neoplasm of supraglottis ([facility])  Carcinoma larynx ([place])     Problem List Patient Active Problem List   Diagnosis Date Noted  . Oral thrush 05/18/2020  . Poor dentition 05/18/2020  . Radiation-induced fibrosis of skin from therapeutic procedure 05/18/2020  . Preventive measure 05/18/2020  . Tracheostomy care ([place]) 03/30/2020  . Hypoparathyroidism ([place]) 02/09/2020  . Pancytopenia, acquired ([place]) 01/27/2020  . Deficiency anemia 01/27/2020  . Cancer associated pain 01/27/2020  . Alaryngeal voice 12/15/2019  . Goals of care, counseling/discussion 07/07/2019  . Tracheostomy dependence ([place]) 04/05/2019  . Hyponatremia 03/31/2019  . Anemia due to antineoplastic chemotherapy 03/03/2019  . Protein malnutrition ([place]) 03/03/2019  . Port-A-Cath in place 02/10/2019  . Laryngeal squamous cell carcinoma ([place]) 01/18/2019  . Acute respiratory failure  with hypoxia and hypercapnia ([place]) 12/03/2018  . Hypothyroidism 12/03/2018  . Laryngeal mass 12/03/2018  . Type 2 MI (myocardial infarction) ([place]) 12/03/2018    Otelia Limes, PTA 07/10/2020, 4:35 PM  [place], [place], [address] Phone: [phone]   Fax:  [phone]  Name: AYDIN CAVALIERI MRN: [id number]  Date of Birth: [birth year]

## 2020-07-12 DIAGNOSIS — C329 Malignant neoplasm of larynx, unspecified: Secondary | ICD-10-CM | POA: Diagnosis not present

## 2020-07-12 DIAGNOSIS — R1314 Dysphagia, pharyngoesophageal phase: Secondary | ICD-10-CM | POA: Diagnosis not present

## 2020-07-13 DIAGNOSIS — Z430 Encounter for attention to tracheostomy: Secondary | ICD-10-CM | POA: Diagnosis not present

## 2020-07-17 ENCOUNTER — Ambulatory Visit: Payer: Medicare HMO

## 2020-07-17 ENCOUNTER — Other Ambulatory Visit: Payer: Self-pay

## 2020-07-17 DIAGNOSIS — G8929 Other chronic pain: Secondary | ICD-10-CM | POA: Diagnosis not present

## 2020-07-17 DIAGNOSIS — L599 Disorder of the skin and subcutaneous tissue related to radiation, unspecified: Secondary | ICD-10-CM

## 2020-07-17 DIAGNOSIS — R293 Abnormal posture: Secondary | ICD-10-CM

## 2020-07-17 DIAGNOSIS — M25612 Stiffness of left shoulder, not elsewhere classified: Secondary | ICD-10-CM

## 2020-07-17 DIAGNOSIS — C329 Malignant neoplasm of larynx, unspecified: Secondary | ICD-10-CM | POA: Diagnosis not present

## 2020-07-17 DIAGNOSIS — C321 Malignant neoplasm of supraglottis: Secondary | ICD-10-CM | POA: Diagnosis not present

## 2020-07-17 DIAGNOSIS — M25512 Pain in left shoulder: Secondary | ICD-10-CM | POA: Diagnosis not present

## 2020-07-17 DIAGNOSIS — R262 Difficulty in walking, not elsewhere classified: Secondary | ICD-10-CM | POA: Diagnosis not present

## 2020-07-17 NOTE — Therapy (Addendum)
[place], [place], [address] Phone: [phone]   Fax:  [phone]  Physical Therapy Treatment  Patient Details  Name: [name] [name] MRN: [id number] Date of Birth: [birth year] Referring Provider (PT): Alvy Bimler   Encounter Date: 07/17/2020   PT End of Session - 07/17/20 1228    Visit Number 3    Number of Visits 9    Date for PT Re-Evaluation 07/31/20    PT Start Time 1107    PT Stop Time 1210    PT Time Calculation (min) 63 min    Activity Tolerance Patient tolerated treatment well    Behavior During Therapy [facility] for tasks assessed/performed           Past Medical History:  Diagnosis Date  . Cataract   . History of radiation therapy 02/11/19- 04/06/19   bilateral neck nodes and upper mediastinum  . Laryngeal cancer ([place]) 11/2018  . Thyroid disease     Past Surgical History:  Procedure Laterality Date  . CATARACT EXTRACTION, BILATERAL  left 05/20/16, right 06/03/16   Cataract Extraction w/ IOL Implant; Surgeon: Sudie Grumbling, MD; Location: [facility]; Service: Ophthalmology   . CESAREAN SECTION    . ESOPHAGOGASTRODUODENOSCOPY (EGD) WITH PROPOFOL N/A 02/01/2019   Procedure: ESOPHAGOGASTRODUODENOSCOPY (EGD) WITH PROPOFOL;  Surgeon: Milus Banister, MD;  [facility];  Service: Endoscopy;  Laterality: N/A;  . EUS N/A 02/01/2019   Procedure: UPPER ENDOSCOPIC ULTRASOUND (EUS) RADIAL-Will need Linear and Forward Viewing EUS Scope;  Surgeon: Milus Banister, MD;  [facility];  Service: Endoscopy;  Laterality: N/A;  . FINE NEEDLE ASPIRATION N/A 02/01/2019   Procedure: FINE NEEDLE ASPIRATION (FNA) LINEAR;  Surgeon: Milus Banister, MD;  [facility];  Service: Endoscopy;  Laterality: N/A;  . IR GASTROSTOMY TUBE MOD SED  02/09/2019  . IR IMAGING GUIDED PORT INSERTION  02/02/2019  . IR PATIENT EVAL TECH 0-60 MINS  01/06/2020  . IR RADIOLOGIST EVAL & MGMT  04/10/2020  . KNEE  CARTILAGE SURGERY Right 2019  . TRACHEOSTOMY  12/02/2018   [facility]    There were no vitals filed for this visit.   Subjective Assessment - 07/17/20 1111    Subjective I felt okay after last session, just a little sore. I've been doing my pulleys at home but fixed how I was doing it like you showed me. Also have been doing the theraband exercise you gave me last time and I can move my arm a little better already.    Pertinent History cancer of larynx and supraglottis; 12/03/18- larynx, supraglottis biopsy, chemo from May 20- July 20, 10/27-Total glossectomy, total pharyngectomy, totallaryngectomy, total thyroidectomy, and cervical esophagectomy,TUMOR EXTENSION: Tumor involves the posterior tongue, pharynx,larynx, tracheal cartilage, hyoid bone, cervical esophagus, vagusnerve, carotid artery, deep cervical fascia, ans surroundingskeletal muscle and soft tissue. Pt completed radiaiton.    Patient Stated Goals to be able to move shoulder better and decrease pain    Currently in Pain? Yes    Pain Score 6    5-6   Pain Location Scapula    Pain Orientation Left    Pain Descriptors / Indicators Other (Comment)   grabbing   Pain Type Surgical pain    Pain Onset --   more than a year   Pain Frequency Intermittent   only aches when at rest   Aggravating Factors  washing dishes    Pain Relieving Factors heating pad, rest  [facility] PT Assessment - 07/17/20 0001      AROM   Left Shoulder Flexion 109 Degrees    Left Shoulder ABduction 89 Degrees    Left Shoulder External Rotation 49 Degrees   arm at side; in 90 degrees scaption 60 deg er   Cervical Flexion 50% limited   limited due to stoma   Cervical - Right Side Bend 90% limited due to tightness    Cervical - Left Side Bend 90% limited due to tightness    Cervical - Right Rotation 75% limited     Cervical - Left Rotation 75% limited                         [facility] Adult PT Treatment/Exercise - 07/17/20 0001        Manual Therapy   Manual Therapy Soft tissue mobilization;Scapular mobilization;Passive ROM;Neural Stretch    Soft tissue mobilization With coconut oil: initally in supine for STM to anterior shoulder and upper arm where multiplt trigger points palpable, then in Rt S/L for STM to Lt upper trap, medial and lateral scapular border trigger point release; also gentle scar tissue massage over superior aspect of scar, pt with increased tenderness at all posterior trunk today initially but this did improve by end of session tolerating mod pressure    Scapular Mobilization When in Rt S/L to Lt scapula into retraction and protraction gently and to pts tolerance; very tight initally with limited mobility and increased TTP but mobility improved some by end of session as did tenderness    Passive ROM Pt able to tolerate this well today and was able to relax better today though with muliple VCs to do so, in supine to Lt shoulder into flexion and abduction to tolerance    Neural Stretch Pt was indicating tingling in fingers so added neural tension stretch intermittently during P/ROM which she felt up majoriy of medial arm                       PT Long Term Goals - 07/03/20 1101      PT LONG TERM GOAL #1   Title Pt will report she is able to sleep through the night and not wake up due to pain in L shoulder    Baseline has difficulty sleeping due to pain    Time 4    Period Weeks    Status New    Target Date 07/31/20      PT LONG TERM GOAL #2   Title Pt will be able to prepare meals without being limited by her left shoulder ROM or pain    Time 4    Period Weeks    Status New    Target Date 07/31/20      PT LONG TERM GOAL #3   Title Pt will be independent in a home exercise program for all over strengthening and stretching for entire body.    Time 4    Period Weeks    Status New    Target Date 07/31/20      PT LONG TERM GOAL #4   Title Pt will demonstrate 90 degrees of left shoulder  flexion and abduction to allow her to complete ADLs more easily.    Baseline flex: 61; abd: 56    Time 4    Period Weeks    Status New    Target Date 07/31/20  Plan - 07/17/20 1231    Clinical Impression Statement Continued with focus on manual therapy. Pt with much improved tolerance to presure at anterior shoulder and upper arm, though still with multiple palpable trigger points. When in Rt S/L her tenderness at posterior trunk was increased only tolerating min pressure but after spending time here and multiple VCs to pt to relax guarded muscles she was able to tolerate mod pressure without wincing as well. She did well tolerating P/ROM today, though still requiring mutliple VCs to relax involuntary muscle guarding. Good improvement was noted with being able to do this today and her A/ROM has already imrpoved well since evaluation. Encouraged pt to cont use of her pulleys at home and scapular strength with theraband. Also, in answer ot her question, okay to progress to wall climbs as well if she is aware of not hiking shoulder. Pt verbalized understanding and continued with use of pen and paper for communication due to her being non verbal.    Personal Factors and Comorbidities Time since onset of injury/illness/exacerbation;Fitness    Examination-Activity Limitations Lift;Reach Overhead;Carry;Sleep    Examination-Participation Restrictions Yard Work;Driving;Community Activity;Meal Prep;Cleaning;Laundry    Stability/Clinical Decision Making Evolving/Moderate complexity    Rehab Potential Good    PT Frequency 2x / week    PT Duration 4 weeks    PT Treatment/Interventions ADLs/Self Care Home Management;Iontophoresis 4mg /ml Dexamethasone;Therapeutic activities;Therapeutic exercise;Neuromuscular re-education;Patient/family education;Manual techniques;Scar mobilization;Passive range of motion;Taping    PT Next Visit Plan Try Meeks decompression exs? Cont gentle soft tissue work  to entire left posterior shoulder and scapular muscles, do scapular mobilization, PROM L shoulder; do 30 sec sit to stand and add goal for this to work on overall strength since pt had recent fall    PT Home Exercise Plan Cont with her pulleys at home; yellow theraband for scap retract and er walk outs, wall climbs into flex    Consulted and Agree with Plan of Care Patient           Patient will benefit from skilled therapeutic intervention in order to improve the following deficits and impairments:  Impaired flexibility, Decreased balance, Decreased activity tolerance, Decreased endurance, Decreased range of motion, Decreased strength, Impaired UE functional use, Postural dysfunction, Decreased scar mobility, Increased fascial restricitons, Pain  Visit Diagnosis: Chronic left shoulder pain  Stiffness of left shoulder, not elsewhere classified  Abnormal posture  Disorder of the skin and subcutaneous tissue related to radiation, unspecified  Difficulty in walking, not elsewhere classified  Malignant neoplasm of supraglottis (HCC)  Carcinoma larynx ([place])     Problem List Patient Active Problem List   Diagnosis Date Noted  . Oral thrush 05/18/2020  . Poor dentition 05/18/2020  . Radiation-induced fibrosis of skin from therapeutic procedure 05/18/2020  . Preventive measure 05/18/2020  . Tracheostomy care ([place]) 03/30/2020  . Hypoparathyroidism ([place]) 02/09/2020  . Pancytopenia, acquired ([place]) 01/27/2020  . Deficiency anemia 01/27/2020  . Cancer associated pain 01/27/2020  . Alaryngeal voice 12/15/2019  . Goals of care, counseling/discussion 07/07/2019  . Tracheostomy dependence ([place]) 04/05/2019  . Hyponatremia 03/31/2019  . Anemia due to antineoplastic chemotherapy 03/03/2019  . Protein malnutrition ([place]) 03/03/2019  . Port-A-Cath in place 02/10/2019  . Laryngeal squamous cell carcinoma ([place]) 01/18/2019  . Acute respiratory failure with hypoxia and hypercapnia ([place])  12/03/2018  . Hypothyroidism 12/03/2018  . Laryngeal mass 12/03/2018  . Type 2 MI (myocardial infarction) ([place]) 12/03/2018    Otelia Limes, PTA 07/17/2020, 5:04 PM  [facility]  [place], [place], [address] Phone: [phone]   Fax:  [phone]  Name: [name] [name] MRN: [id number] Date of Birth: [birth year]

## 2020-07-19 ENCOUNTER — Encounter: Payer: Medicare HMO | Admitting: Physical Therapy

## 2020-07-24 ENCOUNTER — Encounter: Payer: Self-pay | Admitting: Physical Therapy

## 2020-07-24 ENCOUNTER — Other Ambulatory Visit: Payer: Self-pay

## 2020-07-24 ENCOUNTER — Ambulatory Visit: Payer: Medicare HMO | Attending: Hematology and Oncology | Admitting: Physical Therapy

## 2020-07-24 DIAGNOSIS — L599 Disorder of the skin and subcutaneous tissue related to radiation, unspecified: Secondary | ICD-10-CM

## 2020-07-24 DIAGNOSIS — R293 Abnormal posture: Secondary | ICD-10-CM | POA: Diagnosis not present

## 2020-07-24 DIAGNOSIS — M25612 Stiffness of left shoulder, not elsewhere classified: Secondary | ICD-10-CM | POA: Diagnosis not present

## 2020-07-24 DIAGNOSIS — M25512 Pain in left shoulder: Secondary | ICD-10-CM | POA: Insufficient documentation

## 2020-07-24 DIAGNOSIS — C329 Malignant neoplasm of larynx, unspecified: Secondary | ICD-10-CM | POA: Insufficient documentation

## 2020-07-24 DIAGNOSIS — C321 Malignant neoplasm of supraglottis: Secondary | ICD-10-CM | POA: Diagnosis not present

## 2020-07-24 DIAGNOSIS — G8929 Other chronic pain: Secondary | ICD-10-CM

## 2020-07-24 NOTE — Therapy (Signed)
[place], [place], [address] Phone: [phone]   Fax:  [phone]  Physical Therapy Treatment  Patient Details  Name: [name] [name] MRN: [id number] Date of Birth: 1951-06-02 Referring Provider (PT): Alvy Bimler   Encounter Date: 07/24/2020   PT End of Session - 07/24/20 1500    Visit Number 4    Number of Visits 9    Date for PT Re-Evaluation 07/31/20    PT Start Time 1404    PT Stop Time 1455    PT Time Calculation (min) 51 min    Activity Tolerance Patient tolerated treatment well    Behavior During Therapy [facility] for tasks assessed/performed           Past Medical History:  Diagnosis Date  . Cataract   . History of radiation therapy 02/11/19- 04/06/19   bilateral neck nodes and upper mediastinum  . Laryngeal cancer ([place]) 11/2018  . Thyroid disease     Past Surgical History:  Procedure Laterality Date  . CATARACT EXTRACTION, BILATERAL  left 05/20/16, right 06/03/16   Cataract Extraction w/ IOL Implant; Surgeon: Sudie Grumbling, MD; Location: [facility]; Service: Ophthalmology   . CESAREAN SECTION    . ESOPHAGOGASTRODUODENOSCOPY (EGD) WITH PROPOFOL N/A 02/01/2019   Procedure: ESOPHAGOGASTRODUODENOSCOPY (EGD) WITH PROPOFOL;  Surgeon: Milus Banister, MD;  Location: [facility];  Service: Endoscopy;  Laterality: N/A;  . EUS N/A 02/01/2019   Procedure: UPPER ENDOSCOPIC ULTRASOUND (EUS) RADIAL-Will need Linear and Forward Viewing EUS Scope;  Surgeon: Milus Banister, MD;  Location: [facility];  Service: Endoscopy;  Laterality: N/A;  . FINE NEEDLE ASPIRATION N/A 02/01/2019   Procedure: FINE NEEDLE ASPIRATION (FNA) LINEAR;  Surgeon: Milus Banister, MD;  Location: [facility];  Service: Endoscopy;  Laterality: N/A;  . IR GASTROSTOMY TUBE MOD SED  02/09/2019  . IR IMAGING GUIDED PORT INSERTION  02/02/2019  . IR PATIENT EVAL TECH 0-60 MINS  01/06/2020  . IR RADIOLOGIST EVAL & MGMT  04/10/2020  . KNEE  CARTILAGE SURGERY Right 2019  . TRACHEOSTOMY  12/02/2018   [facility]    There were no vitals filed for this visit.   Subjective Assessment - 07/24/20 1407    Subjective My arm is doing good. I still have some stabbing pain when I move it backwards.    Pertinent History cancer of larynx and supraglottis; 12/03/18- larynx, supraglottis biopsy, chemo from May 20- July 20, 10/27-Total glossectomy, total pharyngectomy, totallaryngectomy, total thyroidectomy, and cervical esophagectomy,TUMOR EXTENSION: Tumor involves the posterior tongue, pharynx,larynx, tracheal cartilage, hyoid bone, cervical esophagus, vagusnerve, carotid artery, deep cervical fascia, ans surroundingskeletal muscle and soft tissue. Pt completed radiaiton.    Patient Stated Goals to be able to move shoulder better and decrease pain    Currently in Pain? Yes    Pain Score 5     Pain Location Scapula    Pain Orientation Left    Pain Descriptors / Indicators Cramping    Pain Type Chronic pain                             [facility] Adult PT Treatment/Exercise - 07/24/20 0001      Manual Therapy   Soft tissue mobilization With coconut oil: initally in supine for STM to anterior shoulder and upper arm where multiplt trigger points palpable, then in Rt S/L for STM to Lt upper trap, medial and lateral scapular border trigger point release; also gentle  scar tissue massage over superior aspect of scar, pt with increased tenderness at all posterior trunk today initially but this did improve by end of session tolerating mod pressure    Scapular Mobilization When in Rt S/L to Lt scapula into retraction and protraction gently and to pts tolerance    Passive ROM in supine in direction of flexion and abduction to pts tolerance, she did not require any v/c to relax today- scar limited ROM near end range                       PT Long Term Goals - 07/03/20 1101      PT LONG TERM GOAL #1   Title Pt will report  she is able to sleep through the night and not wake up due to pain in L shoulder    Baseline has difficulty sleeping due to pain    Time 4    Period Weeks    Status New    Target Date 07/31/20      PT LONG TERM GOAL #2   Title Pt will be able to prepare meals without being limited by her left shoulder ROM or pain    Time 4    Period Weeks    Status New    Target Date 07/31/20      PT LONG TERM GOAL #3   Title Pt will be independent in a home exercise program for all over strengthening and stretching for entire body.    Time 4    Period Weeks    Status New    Target Date 07/31/20      PT LONG TERM GOAL #4   Title Pt will demonstrate 90 degrees of left shoulder flexion and abduction to allow her to complete ADLs more easily.    Baseline flex: 61; abd: 56    Time 4    Period Weeks    Status New    Target Date 07/31/20                 Plan - 07/24/20 1500    Clinical Impression Statement Pt demonstrating good improvement in ROM. She has been doing her stretches 3x daily at home including wall walking but being careful not to hike her shoulder. She did not require any verbal cues to relax today during PROM and was mainly limited by tightness at posterior scar. Pt tolerated moderate pressure during soft tissue moblization of anterior and posterior shoulder joint. Lateral wing of lats was very tight with trigger points noted throughout. Focused on releasing this area.    PT Frequency 2x / week    PT Duration 4 weeks    PT Treatment/Interventions ADLs/Self Care Home Management;Iontophoresis 4mg /ml Dexamethasone;Therapeutic activities;Therapeutic exercise;Neuromuscular re-education;Patient/family education;Manual techniques;Scar mobilization;Passive range of motion;Taping    PT Next Visit Plan Try Meeks decompression exs? Cont gentle soft tissue work to entire left posterior shoulder and scapular muscles, do scapular mobilization, PROM L shoulder; do 30 sec sit to stand and add goal  for this to work on overall strength since pt had recent fall    PT [place] with her pulleys at home; yellow theraband for scap retract and er walk outs, wall climbs into flex    Consulted and Agree with Plan of Care Patient           Patient will benefit from skilled therapeutic intervention in order to improve the following deficits and impairments:  Impaired flexibility, Decreased balance,  Decreased activity tolerance, Decreased endurance, Decreased range of motion, Decreased strength, Impaired UE functional use, Postural dysfunction, Decreased scar mobility, Increased fascial restricitons, Pain  Visit Diagnosis: Chronic left shoulder pain  Stiffness of left shoulder, not elsewhere classified  Abnormal posture  Disorder of the skin and subcutaneous tissue related to radiation, unspecified     Problem List Patient Active Problem List   Diagnosis Date Noted  . Oral thrush 05/18/2020  . Poor dentition 05/18/2020  . Radiation-induced fibrosis of skin from therapeutic procedure 05/18/2020  . Preventive measure 05/18/2020  . Tracheostomy care ([place]) 03/30/2020  . Hypoparathyroidism ([place]) 02/09/2020  . Pancytopenia, acquired ([place]) 01/27/2020  . Deficiency anemia 01/27/2020  . Cancer associated pain 01/27/2020  . Alaryngeal voice 12/15/2019  . Goals of care, counseling/discussion 07/07/2019  . Tracheostomy dependence ([place]) 04/05/2019  . Hyponatremia 03/31/2019  . Anemia due to antineoplastic chemotherapy 03/03/2019  . Protein malnutrition ([place]) 03/03/2019  . Port-A-Cath in place 02/10/2019  . Laryngeal squamous cell carcinoma ([place]) 01/18/2019  . Acute respiratory failure with hypoxia and hypercapnia ([place]) 12/03/2018  . Hypothyroidism 12/03/2018  . Laryngeal mass 12/03/2018  . Type 2 MI (myocardial infarction) ([place]) 12/03/2018    Allyson Sabal [facility] 07/24/2020, 3:03 PM  [place], [place], [address] Phone: [phone]   Fax:  [phone]  Name: ELLAROSE BRANDI MRN: [id number] Date of Birth: 1951/05/07  Manus Gunning, PT 07/24/20 3:03 PM

## 2020-07-26 ENCOUNTER — Encounter: Payer: Medicare HMO | Admitting: Physical Therapy

## 2020-07-28 ENCOUNTER — Inpatient Hospital Stay: Payer: Medicare HMO

## 2020-07-28 ENCOUNTER — Encounter: Payer: Self-pay | Admitting: Hematology and Oncology

## 2020-07-28 ENCOUNTER — Other Ambulatory Visit: Payer: Self-pay

## 2020-07-28 ENCOUNTER — Inpatient Hospital Stay: Payer: Medicare HMO | Admitting: Nutrition

## 2020-07-28 ENCOUNTER — Inpatient Hospital Stay (HOSPITAL_BASED_OUTPATIENT_CLINIC_OR_DEPARTMENT_OTHER): Payer: Medicare HMO | Admitting: Hematology and Oncology

## 2020-07-28 ENCOUNTER — Inpatient Hospital Stay: Payer: Medicare HMO | Attending: Hematology

## 2020-07-28 VITALS — BP 107/67 | HR 73 | Temp 96.6°F | Resp 18 | Ht 62.0 in | Wt 136.4 lb

## 2020-07-28 DIAGNOSIS — Z87891 Personal history of nicotine dependence: Secondary | ICD-10-CM | POA: Insufficient documentation

## 2020-07-28 DIAGNOSIS — Z930 Tracheostomy status: Secondary | ICD-10-CM | POA: Diagnosis not present

## 2020-07-28 DIAGNOSIS — Z79899 Other long term (current) drug therapy: Secondary | ICD-10-CM | POA: Diagnosis not present

## 2020-07-28 DIAGNOSIS — E039 Hypothyroidism, unspecified: Secondary | ICD-10-CM

## 2020-07-28 DIAGNOSIS — G893 Neoplasm related pain (acute) (chronic): Secondary | ICD-10-CM | POA: Diagnosis not present

## 2020-07-28 DIAGNOSIS — C329 Malignant neoplasm of larynx, unspecified: Secondary | ICD-10-CM

## 2020-07-28 DIAGNOSIS — Z5112 Encounter for antineoplastic immunotherapy: Secondary | ICD-10-CM | POA: Insufficient documentation

## 2020-07-28 DIAGNOSIS — C321 Malignant neoplasm of supraglottis: Secondary | ICD-10-CM | POA: Diagnosis not present

## 2020-07-28 DIAGNOSIS — Z95828 Presence of other vascular implants and grafts: Secondary | ICD-10-CM

## 2020-07-28 DIAGNOSIS — K089 Disorder of teeth and supporting structures, unspecified: Secondary | ICD-10-CM | POA: Insufficient documentation

## 2020-07-28 DIAGNOSIS — Z9221 Personal history of antineoplastic chemotherapy: Secondary | ICD-10-CM | POA: Insufficient documentation

## 2020-07-28 DIAGNOSIS — B370 Candidal stomatitis: Secondary | ICD-10-CM | POA: Insufficient documentation

## 2020-07-28 DIAGNOSIS — Z923 Personal history of irradiation: Secondary | ICD-10-CM | POA: Diagnosis not present

## 2020-07-28 LAB — CBC WITH DIFFERENTIAL/PLATELET
Abs Immature Granulocytes: 0.02 10*3/uL (ref 0.00–0.07)
Basophils Absolute: 0 10*3/uL (ref 0.0–0.1)
Basophils Relative: 1 %
Eosinophils Absolute: 0.2 10*3/uL (ref 0.0–0.5)
Eosinophils Relative: 3 %
HCT: 38.8 % (ref 36.0–46.0)
Hemoglobin: 12.9 g/dL (ref 12.0–15.0)
Immature Granulocytes: 1 %
Lymphocytes Relative: 25 %
Lymphs Abs: 1.1 10*3/uL (ref 0.7–4.0)
MCH: 29.1 pg (ref 26.0–34.0)
MCHC: 33.2 g/dL (ref 30.0–36.0)
MCV: 87.6 fL (ref 80.0–100.0)
Monocytes Absolute: 0.5 10*3/uL (ref 0.1–1.0)
Monocytes Relative: 10 %
Neutro Abs: 2.7 10*3/uL (ref 1.7–7.7)
Neutrophils Relative %: 60 %
Platelets: 200 10*3/uL (ref 150–400)
RBC: 4.43 MIL/uL (ref 3.87–5.11)
RDW: 12.9 % (ref 11.5–15.5)
WBC: 4.4 10*3/uL (ref 4.0–10.5)
nRBC: 0 % (ref 0.0–0.2)

## 2020-07-28 LAB — COMPREHENSIVE METABOLIC PANEL
ALT: 24 U/L (ref 0–44)
AST: 21 U/L (ref 15–41)
Albumin: 3.9 g/dL (ref 3.5–5.0)
Alkaline Phosphatase: 70 U/L (ref 38–126)
Anion gap: 8 (ref 5–15)
BUN: 21 mg/dL (ref 8–23)
CO2: 28 mmol/L (ref 22–32)
Calcium: 8.8 mg/dL — ABNORMAL LOW (ref 8.9–10.3)
Chloride: 102 mmol/L (ref 98–111)
Creatinine, Ser: 0.71 mg/dL (ref 0.44–1.00)
GFR, Estimated: >60 mL/min (ref >60)
Glucose, Bld: 99 mg/dL (ref 70–99)
Potassium: 3.9 mmol/L (ref 3.5–5.1)
Sodium: 138 mmol/L (ref 135–145)
Total Bilirubin: 0.3 mg/dL (ref 0.3–1.2)
Total Protein: 7.1 g/dL (ref 6.5–8.1)

## 2020-07-28 LAB — TSH: TSH: 1.182 u[IU]/mL (ref 0.308–3.960)

## 2020-07-28 LAB — T4, FREE: Free T4: 1.07 ng/dL (ref 0.61–1.12)

## 2020-07-28 MED ORDER — HEPARIN SOD (PORK) LOCK FLUSH 100 UNIT/ML IV SOLN
500.0000 [IU] | Freq: Once | INTRAVENOUS | Status: AC | PRN
  Administered 2020-07-28: 500 [IU]
  Filled 2020-07-28: qty 5

## 2020-07-28 MED ORDER — SODIUM CHLORIDE 0.9% FLUSH
10.0000 mL | Freq: Once | INTRAVENOUS | Status: AC
  Administered 2020-07-28: 10 mL
  Filled 2020-07-28: qty 10

## 2020-07-28 MED ORDER — HYDROCODONE-ACETAMINOPHEN 5-325 MG PO TABS: 1.0000 | ORAL_TABLET | Freq: Three times a day (TID) | ORAL | 0 refills | Status: DC | PRN

## 2020-07-28 MED ORDER — SODIUM CHLORIDE 0.9 % IV SOLN
Freq: Once | INTRAVENOUS | Status: AC
  Filled 2020-07-28: qty 250

## 2020-07-28 MED ORDER — CHLORHEXIDINE GLUCONATE 0.12 % MT SOLN
OROMUCOSAL | 3 refills | Status: DC
Start: 2020-07-28 — End: 2020-11-10

## 2020-07-28 MED ORDER — SODIUM CHLORIDE 0.9 % IV SOLN
200.0000 mg | Freq: Once | INTRAVENOUS | Status: AC
  Administered 2020-07-28: 200 mg via INTRAVENOUS
  Filled 2020-07-28: qty 8

## 2020-07-28 MED ORDER — FLUCONAZOLE 100 MG PO TABS: 100.0000 mg | ORAL_TABLET | Freq: Every day | ORAL | 0 refills | Status: DC

## 2020-07-28 MED ORDER — SODIUM CHLORIDE 0.9% FLUSH
10.0000 mL | INTRAVENOUS | Status: DC | PRN
  Administered 2020-07-28: 10 mL
  Filled 2020-07-28: qty 10

## 2020-07-28 NOTE — Assessment & Plan Note (Signed)
Her pain is well controlled We discussed narcotic refill policy

## 2020-07-28 NOTE — Progress Notes (Signed)
Nutrition follow up completed with patient during infusion for recurrent laryngeal cancer. Patient reports good tolerance of [place] Peptide 1.5 via PEG. She denies nausea, vomiting, diarrhea and constipation. Reports increased hunger and wants to increase TF. She received formula through [facility]. New TF orders written on August 5. Weight stable at 136.4 pounds Nov 5.   Continue kate farms 1.5 peptide via feeding tube.  Add Boost as needed for hunger.  Will follow as needed. Patient is stable on Tube Feeding.

## 2020-07-28 NOTE — Progress Notes (Signed)
[facility] OFFICE PROGRESS NOTE  Patient Care Team: Hamrick, Lorin Mercy, MD as PCP - General (Family Medicine) Eppie Gibson, MD as Attending Physician (Radiation Oncology) Leota Sauers, RN (Inactive) as Oncology Nurse Navigator Karie Mainland, RD as Dietitian (Nutrition) Sharen Counter, CCC-SLP as Speech Language Pathologist (Speech Pathology) Malmfelt, Stephani Police, RN as Oncology Nurse Navigator (Oncology)  ASSESSMENT & PLAN:  Laryngeal squamous cell carcinoma [facility]) Her recent imaging studies showed stable disease control with single agent pembrolizumab I plan to repeat CT imaging in December  Oral thrush She has persistent recurrent oral thrush I recommend another course of fluconazole We discussed the importance of aggressive dental hygiene and to brush her tongue and the roof of the mouth with a soft toothbrush if possible  Cancer associated pain Her pain is well controlled We discussed narcotic refill policy     Orders Placed This Encounter  Procedures  . CT CHEST ABDOMEN PELVIS W CONTRAST    Standing Status:   Future    Standing Expiration Date:   07/28/2021    Order Specific Question:   Preferred imaging location?    Answer:   [facility]    Order Specific Question:   Radiology Contrast Protocol - do NOT remove file path    Answer:   [email or web address]    All questions were answered. The patient knows to call the clinic with any problems, questions or concerns. The total time spent in the appointment was 20 minutes encounter with patients including review of chart and various tests results, discussions about plan of care and coordination of care plan   Heath Lark, MD 07/28/2020 10:54 AM  INTERVAL HISTORY: Please see below for problem oriented charting. She returns with her son for further follow-up Her chronic pain is stable No recent infection, fever or chills Her appetite is fair She has not  lost any weight  SUMMARY OF ONCOLOGIC HISTORY: Oncology History  Laryngeal squamous cell carcinoma (HCC)  12/02/2018 Imaging   CT neck: IMPRESSION: 1. Supraglottic laryngeal carcinoma extending across the anterior commissure and invading the prelaryngeal fat, left aryepiglottic fold and left aspect of the epiglottis. 2. Severe narrowing of the laryngeal airway. 3. Bilateral subcentimeter level 2A and 2A cervical lymph nodes.     12/02/2018 Imaging   CT  Chest:  IMPRESSION: No evidence of metastatic disease in the chest.   12/03/2018 Pathology Results   (Care Everywhere; [facility]) A: Larynx, supraglottis, biopsy - Atypical squamous proliferation with at least low grade dysplasia and abundant keratinization - No definite high grade dysplasia or invasive carcinoma represented (see comment)   12/18/2018 Pathology Results   (Care Everywhere; [facility]) A:  Neck, left, fine needle aspiration - Rare single atypical cells, no cells diagnostic of malignancy - Lymph node material present  B:  Neck, right, fine needle aspiration - Non-diagnostic specimen - No lymph node material identified - Mixed inflammation and blood   12/31/2018 Pathology Results   (Care Everywhere; [facility])  A: Larynx, left supraglottic mass, biopsy - Atypical squamous proliferation with abundant keratinization, suspicious for well-differentiated invasive squamous cell carcinoma  B: Larynx, left supraglottic mass, biopsy - Well-differentiated invasive squamous cell carcinoma with abundant keratinization (see comment)   01/18/2019 Initial Diagnosis   Laryngeal cancer ([place])   01/26/2019 Imaging   1. Locally advanced hypermetabolic laryngeal neoplasm centered in the left supraglottic space with bilateral glottic and epiglottic involvement and subglottic extension on the left. 2. Hypermetabolic bilateral level 3 neck nodal  metastases. 3. Hypermetabolic high mediastinal nodal metastases between the trachea  and upper thoracic esophagus. 4. Otherwise no distant hypermetabolic metastatic disease. 5. Nodular 7 mm focus at the left lung base, below PET resolution, recommend attention on follow-up chest CT in 3 months. 6. Chronic findings include: Aortic Atherosclerosis (ICD10-I70.0). Cholelithiasis.   02/08/2019 Cancer Staging   Staging form: Larynx - Supraglottis, AJCC 8th Edition - Clinical stage from 02/08/2019: Stage IVA (cT3, cN2c, cM0) - Signed by Eppie Gibson, MD on 02/08/2019   02/11/2019 - 04/07/2019 Chemotherapy   The patient weekly cisplatin for chemotherapy treatment.     07/20/2019 Pathology Results   PROCEDURE: Total glossectomy, total pharyngectomy, total laryngectomy, total thyroidectomy, and cervical esophagectomy TUMOR SITE: Larynx TUMOR LATERALITY: Not specified TUMOR FOCALITY: Unifocal TUMOR SIZE:    GREATEST DIMENSION: 6.9 cm    ADDITIONAL DIMENSIONS: 5.4 x 2.4 cm HISTOLOGIC TYPE: Squamous cell carcinoma, conventional (keratinizing) HISTOLOGIC GRADE:  G1-G2: Well to moderately differentiated TUMOR EXTENSION: Tumor involves the posterior tongue, pharynx, larynx, tracheal cartilage, hyoid bone, cervical esophagus, vagus nerve, carotid artery, deep cervical fascia, ans surrounding skeletal muscle and soft tissue MARGINS: Uninvolved by invasive tumor; Perineural invasion is less than 13mm from the left soft tissue margin LYMPHOVASCULAR INVASION: Not identified PERINEURAL INVASION:  Present REGIONAL LYMPH NODES:     NUMBER OF LYMPH NODES INVOLVED: 2     NUMBER OF LYMPH NODES EXAMINED: 24     LATERALITY OF LYMPH NODES INVOLVED: Cannot determine     SIZE OF LARGEST METASTATIC DEPOSIT: 0.8 cm     EXTRANODAL EXTENSION: Not identified PATHOLOGIC STAGE CLASSIFICATION (pTNM, AJCC 8TH Ed): (r)(y) pT4b pN2a DISTANT METASTASIS (pM): ADDITIONAL PATHOLOGIC FINDINGS: Previous chemoradiation   11/04/2019 Imaging   PET: 1.  Hypermetabolic bilateral neck lesions as detailed above are  concerning for disease recurrence. 2.  Compared to 07/08/2019 PET scan, interval development of subcentimeter bilateral upper lobe pulmonary nodules, size below PET resolution. These are concerning for metastasis. Consider diagnostic quality CT chest exam for further evaluation as per oncology treatment planning. 3.  Focal hypermetabolic uptake in right ventricular cavity of indeterminate significance. Recommend echocardiographic examination to rule out underlying thrombus. This finding was discussed with Dr. Conley Canal by Dr. Clementeen Graham and Dr. Marcello Moores on 11/04/2019 at approximately 3:00 PM via telephone. 4.  Ancillary CT findings as above.   11/05/2019 Imaging   Echocardiogram: Summary   1. The left ventricle is normal in size with normal wall thickness.   2. The left ventricular systolic function is normal, LVEF is visually estimated at 60-65%.   3. The right ventricle is normal in size, with normal systolic function.   4. There is an extrinsic compression of the RV and RA laterally at the AV groove by a mass of 4.3 cm diameter. Unclear if the mass is invasive, although no pericardial effusion seen suggestive no invasion possibly. There is no evidence of hemodynamic compromise secondary to cardiac compression with normal IVC diameter.   12/06/2019 Pathology Results   A. LYMPH NODE, LEFT SUBMANDIBULAR, BIOPSY:  - Squamous cell carcinoma.  - No distinct nodal tissue identified.    12/06/2019 Procedure   Successful ultrasound-guided core biopsy of centrally necrotic left submandibular lymph node. Of note, due to extensive prior postsurgical and post radiation changes, patient has very limited mobility in the neck and the biopsy was extremely challenging    12/16/2019 -  Chemotherapy   The patient had pembrolizumab for chemotherapy treatment.     03/08/2020 PET scan   1. Diminished  size of masses in the neck as described. Persistent increased metabolic activity particularly in the LEFT neck. Some of  this diminished size on the LEFT could be due to decompression of necrotic material. Correlate with any symptoms of inflammation or infection in this area as well given the paucity of residual soft tissue associated with the above abnormality, overall findings are compatible with residual disease in this location and there is no significant surrounding stranding to indicate inflammation that would correlate with the degree of FDG uptake remaining. 2. Diminished activity in the juxta cardiac mass with peripheral activity suggesting interval necrosis, on the prior study this was uniformly hypermetabolic. There is still considerable FDG uptake. 3. No signs of disease in the abdomen or pelvis. 4. 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 Correlate with any new pain or symptoms in this area with further imaging as warranted. No discrete mass or bone destruction on today's study.   06/08/2020 Imaging   CT neck 1. Status post laryngectomy and neck dissection without lymphadenopathy or mass lesion. 2. Short segment severe stenosis of the left internal carotid artery adjacent to a surgical clip.     06/08/2020 Imaging   CT chest 1. There is a large, hypodense lesion within, or very closely abutting and likely intruding upon the right ventricle, as seen on prior examination and approximately 4.6 x 4.3 cm, poorly evaluated due to cardiac motion. Findings are consistent with metastatic lesion and/or thrombus and poorly assessed by ungated CT. Consider echocardiography and cardiac MRI for further assessment.   2. Multiple small pulmonary nodules in the bilateral lung apices are unchanged. Attention on follow-up.   3.  No evidence of new metastatic disease in the chest.   4.  Aortic Atherosclerosis (ICD10-I70.0).       REVIEW OF SYSTEMS:   Constitutional: Denies fevers, chills or abnormal weight loss Eyes: Denies blurriness of vision Ears, nose, mouth, throat, and face: Denies mucositis or sore  throat Respiratory: Denies cough, dyspnea or wheezes Cardiovascular: Denies palpitation, chest discomfort or lower extremity swelling Gastrointestinal:  Denies nausea, heartburn or change in bowel habits Skin: Denies abnormal skin rashes Lymphatics: Denies new lymphadenopathy or easy bruising Neurological:Denies numbness, tingling or new weaknesses Behavioral/Psych: Mood is stable, no new changes  All other systems were reviewed with the patient and are negative.  I have reviewed the past medical history, past surgical history, social history and family history with the patient and they are unchanged from previous note.  ALLERGIES:  has No Known Allergies.  MEDICATIONS:  Current Outpatient Medications  Medication Sig Dispense Refill  . chlorhexidine (PERIDEX) 0.12 % solution Rinse with 15 mls twice daily for 30 seconds. Use after breakfast and at bedtime. Spit out excess. Do not swallow. 480 mL 3  . fluconazole (DIFLUCAN) 100 MG tablet Take 1 tablet (100 mg total) by mouth daily. 7 tablet 0  . HYDROcodone-acetaminophen (NORCO) 5-325 MG tablet Take 1 tablet by mouth every 8 (eight) hours as needed for moderate pain. 60 tablet 0  . levothyroxine (SYNTHROID) 137 MCG tablet Take 137 mcg by mouth daily before breakfast.    . lidocaine-prilocaine (EMLA) cream Apply to affected area once 30 g 3  . Nutritional Supplements (K[place] PEPTIDE 1.5) LIQD Take 4 Bottles by mouth daily. 325 mL 11  . ondansetron (ZOFRAN) 8 MG tablet Take 1 tablet (8 mg total) by mouth 2 (two) times daily as needed (Nausea or vomiting). 30 tablet 1  . prochlorperazine (COMPAZINE) 10 MG tablet Take 1 tablet (  10 mg total) by mouth every 6 (six) hours as needed (Nausea or vomiting). 30 tablet 1  . sodium fluoride (PREVIDENT 5000 PLUS) 1.1 % CREA dental cream Apply cream to tooth brush. Brush teeth for 2 minutes. Spit out excess. DO NOT rinse afterwards. Repeat nightly. (Patient taking differently: Place 1 application onto  teeth 2 (two) times a day. ) 1 Tube prn   No current facility-administered medications for this visit.    PHYSICAL EXAMINATION: ECOG PERFORMANCE STATUS: 1 - Symptomatic but completely ambulatory  Vitals:   07/28/20 1040  BP: 107/67  Pulse: 73  Resp: 18  Temp: (!) 96.6 F (35.9 C)  SpO2: 99%   Filed Weights   07/28/20 1040  Weight: 136 lb 6.4 oz (61.9 kg)    GENERAL:alert, no distress and comfortable SKIN: skin color, texture, turgor are normal, no rashes or significant lesions EYES: normal, Conjunctiva are pink and non-injected, sclera clear OROPHARYNX noted very poor dentition.  Oral thrush is present NECK: Tracheostomy in situ LYMPH:  no palpable lymphadenopathy in the cervical, axillary or inguinal LUNGS: clear to auscultation and percussion with normal breathing effort HEART: regular rate & rhythm and no murmurs and no lower extremity edema ABDOMEN:abdomen soft, non-tender and normal bowel sounds Musculoskeletal:no cyanosis of digits and no clubbing  NEURO: alert & oriented x 3, no focal motor/sensory deficits  LABORATORY DATA:  I have reviewed the data as listed    Component Value Date/Time   NA 140 07/07/2020 1023   K 4.3 07/07/2020 1023   CL 103 07/07/2020 1023   CO2 31 07/07/2020 1023   GLUCOSE 98 07/07/2020 1023   BUN 23 07/07/2020 1023   CREATININE 0.73 07/07/2020 1023   CREATININE 0.60 02/17/2020 1128   CALCIUM 9.1 07/07/2020 1023   PROT 7.3 07/07/2020 1023   ALBUMIN 3.9 07/07/2020 1023   AST 22 07/07/2020 1023   AST 22 02/17/2020 1128   ALT 24 07/07/2020 1023   ALT 24 02/17/2020 1128   ALKPHOS 76 07/07/2020 1023   BILITOT 0.3 07/07/2020 1023   BILITOT 0.5 02/17/2020 1128   GFRNONAA >60 07/07/2020 1023   GFRNONAA >60 02/17/2020 1128   GFRAA >60 06/08/2020 1303   GFRAA >60 02/17/2020 1128    No results found for: SPEP, UPEP  Lab Results  Component Value Date   WBC 4.4 07/28/2020   NEUTROABS 2.7 07/28/2020   HGB 12.9 07/28/2020   HCT 38.8  07/28/2020   MCV 87.6 07/28/2020   PLT 200 07/28/2020      Chemistry      Component Value Date/Time   NA 140 07/07/2020 1023   K 4.3 07/07/2020 1023   CL 103 07/07/2020 1023   CO2 31 07/07/2020 1023   BUN 23 07/07/2020 1023   CREATININE 0.73 07/07/2020 1023   CREATININE 0.60 02/17/2020 1128      Component Value Date/Time   CALCIUM 9.1 07/07/2020 1023   ALKPHOS 76 07/07/2020 1023   AST 22 07/07/2020 1023   AST 22 02/17/2020 1128   ALT 24 07/07/2020 1023   ALT 24 02/17/2020 1128   BILITOT 0.3 07/07/2020 1023   BILITOT 0.5 02/17/2020 1128

## 2020-07-28 NOTE — Patient Instructions (Signed)
[facility] Discharge Instructions for Patients Receiving Chemotherapy  Today you received the following chemotherapy agent: Pembrolizumab.  To help prevent nausea and vomiting after your treatment, we encourage you to take your nausea medication as directed.   If you develop nausea and vomiting that is not controlled by your nausea medication, call the clinic.   BELOW ARE SYMPTOMS THAT SHOULD BE REPORTED IMMEDIATELY:  *FEVER GREATER THAN 100.5 F  *CHILLS WITH OR WITHOUT FEVER  NAUSEA AND VOMITING THAT IS NOT CONTROLLED WITH YOUR NAUSEA MEDICATION  *UNUSUAL SHORTNESS OF BREATH  *UNUSUAL BRUISING OR BLEEDING  TENDERNESS IN MOUTH AND THROAT WITH OR WITHOUT PRESENCE OF ULCERS  *URINARY PROBLEMS  *BOWEL PROBLEMS  UNUSUAL RASH Items with * indicate a potential emergency and should be followed up as soon as possible.  Feel free to call the clinic should you have any questions or concerns. The clinic phone number is [phone].  Please show the [facility] at check-in to the Emergency Department and triage nurse.

## 2020-07-28 NOTE — Assessment & Plan Note (Signed)
She has persistent recurrent oral thrush I recommend another course of fluconazole We discussed the importance of aggressive dental hygiene and to brush her tongue and the roof of the mouth with a soft toothbrush if possible

## 2020-07-28 NOTE — Assessment & Plan Note (Signed)
Her recent imaging studies showed stable disease control with single agent pembrolizumab I plan to repeat CT imaging in December

## 2020-07-31 ENCOUNTER — Ambulatory Visit: Payer: Medicare HMO

## 2020-07-31 ENCOUNTER — Other Ambulatory Visit: Payer: Self-pay

## 2020-07-31 DIAGNOSIS — C329 Malignant neoplasm of larynx, unspecified: Secondary | ICD-10-CM | POA: Diagnosis not present

## 2020-07-31 DIAGNOSIS — M25612 Stiffness of left shoulder, not elsewhere classified: Secondary | ICD-10-CM

## 2020-07-31 DIAGNOSIS — R293 Abnormal posture: Secondary | ICD-10-CM

## 2020-07-31 DIAGNOSIS — M25512 Pain in left shoulder: Secondary | ICD-10-CM | POA: Diagnosis not present

## 2020-07-31 DIAGNOSIS — C321 Malignant neoplasm of supraglottis: Secondary | ICD-10-CM | POA: Diagnosis not present

## 2020-07-31 DIAGNOSIS — L599 Disorder of the skin and subcutaneous tissue related to radiation, unspecified: Secondary | ICD-10-CM | POA: Diagnosis not present

## 2020-07-31 DIAGNOSIS — R633 Feeding difficulties, unspecified: Secondary | ICD-10-CM | POA: Diagnosis not present

## 2020-07-31 DIAGNOSIS — G8929 Other chronic pain: Secondary | ICD-10-CM

## 2020-07-31 NOTE — Therapy (Signed)
[place], [place], [address] Phone: [phone]   Fax:  [phone]  Physical Therapy Treatment  Patient Details  Name: [name] [name] MRN: [id number] Date of Birth: Oct 21, 1950 Referring Provider (PT): Alvy Bimler   Encounter Date: 07/31/2020   PT End of Session - 07/31/20 1552    Visit Number 5    Number of Visits 17    Date for PT Re-Evaluation 08/28/20    PT Start Time 2229    PT Stop Time 1455    PT Time Calculation (min) 51 min    Activity Tolerance Patient tolerated treatment well    Behavior During Therapy [facility] for tasks assessed/performed           Past Medical History:  Diagnosis Date   Cataract    History of radiation therapy 02/11/19- 04/06/19   bilateral neck nodes and upper mediastinum   Laryngeal cancer ([place]) 11/2018   Thyroid disease     Past Surgical History:  Procedure Laterality Date   CATARACT EXTRACTION, BILATERAL  left 05/20/16, right 06/03/16   Cataract Extraction w/ IOL Implant; Surgeon: Sudie Grumbling, MD; Location: [facility]; Service: Ophthalmology    CESAREAN SECTION     ESOPHAGOGASTRODUODENOSCOPY (EGD) WITH PROPOFOL N/A 02/01/2019   Procedure: ESOPHAGOGASTRODUODENOSCOPY (EGD) WITH PROPOFOL;  Surgeon: Milus Banister, MD;  [facility];  Service: Endoscopy;  Laterality: N/A;   EUS N/A 02/01/2019   Procedure: UPPER ENDOSCOPIC ULTRASOUND (EUS) RADIAL-Will need Linear and Forward Viewing EUS Scope;  Surgeon: Milus Banister, MD;  [facility];  Service: Endoscopy;  Laterality: N/A;   FINE NEEDLE ASPIRATION N/A 02/01/2019   Procedure: FINE NEEDLE ASPIRATION (FNA) LINEAR;  Surgeon: Milus Banister, MD;  [facility];  Service: Endoscopy;  Laterality: N/A;   IR GASTROSTOMY TUBE MOD SED  02/09/2019   IR IMAGING GUIDED PORT INSERTION  02/02/2019   IR PATIENT EVAL TECH 0-60 MINS  01/06/2020   IR RADIOLOGIST EVAL & MGMT  04/10/2020   KNEE  CARTILAGE SURGERY Right 2019   TRACHEOSTOMY  12/02/2018   [facility]    There were no vitals filed for this visit.   Subjective Assessment - 07/31/20 1401    Subjective Felt a little better after last visit. 5/10 pain in left shoulder ant and posterior today. A drawing/sharp type of pain    Pertinent History cancer of larynx and supraglottis; 12/03/18- larynx, supraglottis biopsy, chemo from May 20- July 20, 10/27-Total glossectomy, total pharyngectomy, totallaryngectomy, total thyroidectomy, and cervical esophagectomy,TUMOR EXTENSION: Tumor involves the posterior tongue, pharynx,larynx, tracheal cartilage, hyoid bone, cervical esophagus, vagusnerve, carotid artery, deep cervical fascia, ans surroundingskeletal muscle and soft tissue. Pt completed radiaiton.    Pain Score 5     Pain Location Shoulder    Pain Orientation Left    Pain Type Chronic pain    Pain Frequency Intermittent                             [facility] Adult PT Treatment/Exercise - 07/31/20 0001      Transfers   Number of Reps Other reps (comment)   (8 reps with 30 second sit to stand test)     Manual Therapy   Soft tissue mobilization With coconut oil: initally in supine for STM to anterior shoulder and upper arm where multiplt trigger points palpable, then in Rt S/L for STM to Lt upper trap, medial and lateral scapular border trigger  point release; also gentle scar tissue massage over superior aspect of scar, pt with increased tenderness at all posterior trunk today initially but this did improve by end of session tolerating mod pressure    Scapular Mobilization When in Rt S/L to Lt scapula into retraction and protraction gently and to pts tolerance    Passive ROM in supine for shoulder flex and abd.  VC's to pt to relax arm.  Empty end feels with ROM and pt experienced several sharp pains in the scapular region with ROM.                       PT Long Term Goals - 07/31/20 1604      PT  LONG TERM GOAL #1   Title Pt will report she is able to sleep through the night and not wake up due to pain in L shoulder    Baseline has difficulty sleeping due to pain    Time 4    Period Weeks    Status On-going    Target Date 08/28/20      PT LONG TERM GOAL #2   Title Pt will be able to prepare meals without being limited by her left shoulder ROM or pain    Time 4    Period Weeks    Status On-going    Target Date 08/28/20      PT LONG TERM GOAL #3   Title Pt will be independent in a home exercise program for all over strengthening and stretching for entire body.    Time 4    Period Weeks    Status Partially Met    Target Date 08/28/20      PT LONG TERM GOAL #4   Title Pt will demonstrate 90 degrees of left shoulder flexion and abduction to allow her to complete ADLs more easily.    Baseline flex: 61; abd: 56    Time 4    Period Weeks    Status On-going    Target Date 08/28/20      PT LONG TERM GOAL #5   Title Pt. will be able to perform 10 sit to stands in 20 seconds to decrease fall risk    Baseline 8    Time 4    Period Weeks    Status New    Target Date 08/28/20                 Plan - 07/31/20 1554    Clinical Impression Statement Pt is compliant with her HEP for using pulleys and stretching 3xs daily.  Pt. continues to have multiple trigger/tender points in scapular area, levator and anterior/lateral upper arm.  She notes intermittent sharp stabbing pains mainly in scapular area and lateral arm with A/PROM of left shoulder.  She is able to tolerate mild progressing to moderate pressure with soft tissue work.  she felt improved after session today.    Personal Factors and Comorbidities Time since onset of injury/illness/exacerbation;Fitness    Examination-Activity Limitations Lift;Reach Overhead;Carry;Sleep    Examination-Participation Restrictions Yard Work;Driving;Community Activity;Meal Prep;Cleaning;Laundry    Stability/Clinical Decision Making  Evolving/Moderate complexity    Rehab Potential Good    PT Frequency 2x / week    PT Duration 4 weeks    PT Treatment/Interventions ADLs/Self Care Home Management;Iontophoresis 7m/ml Dexamethasone;Therapeutic activities;Therapeutic exercise;Neuromuscular re-education;Patient/family education;Manual techniques;Scar mobilization;Passive range of motion;Taping;Dry needling    PT Home Exercise Plan Cont with her pulleys at home; yellow theraband for scap retract  and er walk outs, wall climbs into flex    Consulted and Agree with Plan of Care Patient           Patient will benefit from skilled therapeutic intervention in order to improve the following deficits and impairments:  Impaired flexibility, Decreased balance, Decreased activity tolerance, Decreased endurance, Decreased range of motion, Decreased strength, Impaired UE functional use, Postural dysfunction, Decreased scar mobility, Increased fascial restricitons, Pain  Visit Diagnosis: Chronic left shoulder pain  Stiffness of left shoulder, not elsewhere classified  Abnormal posture  Disorder of the skin and subcutaneous tissue related to radiation, unspecified     Problem List Patient Active Problem List   Diagnosis Date Noted   Oral thrush 05/18/2020   Poor dentition 05/18/2020   Radiation-induced fibrosis of skin from therapeutic procedure 05/18/2020   Preventive measure 05/18/2020   Tracheostomy care ([place]) 03/30/2020   Hypoparathyroidism ([place]) 02/09/2020   Pancytopenia, acquired ([place]) 01/27/2020   Deficiency anemia 01/27/2020   Cancer associated pain 01/27/2020   Alaryngeal voice 12/15/2019   Goals of care, counseling/discussion 07/07/2019   Tracheostomy dependence ([place]) 04/05/2019   Hyponatremia 03/31/2019   Anemia due to antineoplastic chemotherapy 03/03/2019   Protein malnutrition ([place]) 03/03/2019   Port-A-Cath in place 02/10/2019   Laryngeal squamous cell carcinoma ([place]) 01/18/2019   Acute  respiratory failure with hypoxia and hypercapnia ([place]) 12/03/2018   Hypothyroidism 12/03/2018   Laryngeal mass 12/03/2018   Type 2 MI (myocardial infarction) ([place]) 12/03/2018    Elsie Ra Walcott,PT 07/31/2020, 4:10 PM  [place], [place], [address] Phone: [phone]   Fax:  [phone]  Name: [name] [name] MRN: [id number] Date of Birth: 03/05/1951

## 2020-08-02 ENCOUNTER — Encounter: Payer: Medicare HMO | Admitting: Physical Therapy

## 2020-08-07 ENCOUNTER — Ambulatory Visit: Payer: Medicare HMO

## 2020-08-07 ENCOUNTER — Other Ambulatory Visit: Payer: Self-pay

## 2020-08-07 DIAGNOSIS — C329 Malignant neoplasm of larynx, unspecified: Secondary | ICD-10-CM | POA: Diagnosis not present

## 2020-08-07 DIAGNOSIS — R293 Abnormal posture: Secondary | ICD-10-CM

## 2020-08-07 DIAGNOSIS — M25612 Stiffness of left shoulder, not elsewhere classified: Secondary | ICD-10-CM | POA: Diagnosis not present

## 2020-08-07 DIAGNOSIS — G8929 Other chronic pain: Secondary | ICD-10-CM

## 2020-08-07 DIAGNOSIS — L599 Disorder of the skin and subcutaneous tissue related to radiation, unspecified: Secondary | ICD-10-CM | POA: Diagnosis not present

## 2020-08-07 DIAGNOSIS — C321 Malignant neoplasm of supraglottis: Secondary | ICD-10-CM

## 2020-08-07 DIAGNOSIS — M25512 Pain in left shoulder: Secondary | ICD-10-CM

## 2020-08-07 NOTE — Therapy (Signed)
[place], [place], [address] Phone: [phone]   Fax:  [phone]  Physical Therapy Treatment  Patient Details  Name: [name] [name] MRN: [id number] Date of Birth: 10-07-1950 Referring Provider (PT): Alvy Bimler   Encounter Date: 08/07/2020   PT End of Session - 08/07/20 1813    Visit Number 6    Number of Visits 17    Date for PT Re-Evaluation 08/28/20    PT Start Time 1505    PT Stop Time 1550    PT Time Calculation (min) 45 min    Activity Tolerance Patient tolerated treatment well    Behavior During Therapy [facility] for tasks assessed/performed           Past Medical History:  Diagnosis Date   Cataract    History of radiation therapy 02/11/19- 04/06/19   bilateral neck nodes and upper mediastinum   Laryngeal cancer ([place]) 11/2018   Thyroid disease     Past Surgical History:  Procedure Laterality Date   CATARACT EXTRACTION, BILATERAL  left 05/20/16, right 06/03/16   Cataract Extraction w/ IOL Implant; Surgeon: Sudie Grumbling, MD; Location: [facility]; Service: Ophthalmology    CESAREAN SECTION     ESOPHAGOGASTRODUODENOSCOPY (EGD) WITH PROPOFOL N/A 02/01/2019   Procedure: ESOPHAGOGASTRODUODENOSCOPY (EGD) WITH PROPOFOL;  Surgeon: Milus Banister, MD;  [facility];  Service: Endoscopy;  Laterality: N/A;   EUS N/A 02/01/2019   Procedure: UPPER ENDOSCOPIC ULTRASOUND (EUS) RADIAL-Will need Linear and Forward Viewing EUS Scope;  Surgeon: Milus Banister, MD;  [facility];  Service: Endoscopy;  Laterality: N/A;   FINE NEEDLE ASPIRATION N/A 02/01/2019   Procedure: FINE NEEDLE ASPIRATION (FNA) LINEAR;  Surgeon: Milus Banister, MD;  [facility];  Service: Endoscopy;  Laterality: N/A;   IR GASTROSTOMY TUBE MOD SED  02/09/2019   IR IMAGING GUIDED PORT INSERTION  02/02/2019   IR PATIENT EVAL TECH 0-60 MINS  01/06/2020   IR RADIOLOGIST EVAL & MGMT  04/10/2020    KNEE CARTILAGE SURGERY Right 2019   TRACHEOSTOMY  12/02/2018   [facility]    There were no vitals filed for this visit.   Subjective Assessment - 08/07/20 1805    Subjective Feeling much better overall.  My roommate has been doing soft tissue work 3 times/week for almost an hour. Overall pain is less and can raise arm better.  Still get intermittent sharp pains    Pertinent History cancer of larynx and supraglottis; 12/03/18- larynx, supraglottis biopsy, chemo from May 20- July 20, 10/27-Total glossectomy, total pharyngectomy, totallaryngectomy, total thyroidectomy, and cervical esophagectomy,TUMOR EXTENSION: Tumor involves the posterior tongue, pharynx,larynx, tracheal cartilage, hyoid bone, cervical esophagus, vagusnerve, carotid artery, deep cervical fascia, ans surroundingskeletal muscle and soft tissue. Pt completed radiaiton.    Patient Stated Goals to be able to move shoulder better and decrease pain    Currently in Pain? Yes    Pain Score 3     Pain Location Shoulder    Pain Orientation Left    Pain Descriptors / Indicators Spasm;Cramping    Pain Type Chronic pain    Pain Onset More than a month ago    Pain Frequency Intermittent                             [facility] Adult PT Treatment/Exercise - 08/07/20 0001      Manual Therapy   Soft tissue mobilization With coconut oil: initally in  supine for STM to anterior shoulder and upper arm where multiple  trigger points palpable, then in Rt S/L for STM to Lt upper trap, medial and lateral scapular border trigger point release; also gentle scar tissue massage over superior aspect of scar, pt with decreased tenderness throughout arm and scapular area/posterior trunk today    Scapular Mobilization When in Rt S/L to Lt scapula into retraction and protraction gently and to pts tolerance.  Also did gentle scapular D2 extension in SL, but felt non-painful popping in neck so discontinued.    Passive ROM in supine with  occasional VC's to relax done for flex, scaption, IR and ER with excellent improvement in ROM today and minimal discomfort.                       PT Long Term Goals - 07/31/20 1604      PT LONG TERM GOAL #1   Title Pt will report she is able to sleep through the night and not wake up due to pain in L shoulder    Baseline has difficulty sleeping due to pain    Time 4    Period Weeks    Status On-going    Target Date 08/28/20      PT LONG TERM GOAL #2   Title Pt will be able to prepare meals without being limited by her left shoulder ROM or pain    Time 4    Period Weeks    Status On-going    Target Date 08/28/20      PT LONG TERM GOAL #3   Title Pt will be independent in a home exercise program for all over strengthening and stretching for entire body.    Time 4    Period Weeks    Status Partially Met    Target Date 08/28/20      PT LONG TERM GOAL #4   Title Pt will demonstrate 90 degrees of left shoulder flexion and abduction to allow her to complete ADLs more easily.    Baseline flex: 61; abd: 56    Time 4    Period Weeks    Status On-going    Target Date 08/28/20      PT LONG TERM GOAL #5   Title Pt. will be able to perform 10 sit to stands in 20 seconds to decrease fall risk    Baseline 8    Time 4    Period Weeks    Status New    Target Date 08/28/20                 Plan - 08/07/20 1814    Clinical Impression Statement Pt is noticing significant decrease in pain and improvement in shoulder ROM since her room mate is also performing Soft tissue work on her 3xs per week.  There was noticeably less tenderness and spasm throughout upper arm and shoulder blade area.  PROM for flex and scaption was WNL today without increased pain.  Good overall improvement and advised pt to have her roommate continue soft tissue work. She experienced some non-painful  "popping" in her lower C spine with Scapular PNF so it was discontinued    Personal Factors and  Comorbidities Time since onset of injury/illness/exacerbation;Fitness    Examination-Activity Limitations Lift;Reach Overhead;Carry;Sleep    Examination-Participation Restrictions Yard Work;Driving;Community Activity;Meal Prep;Cleaning;Laundry    Stability/Clinical Decision Making Evolving/Moderate complexity    Clinical Decision Making Moderate    Rehab Potential Good  PT Duration 4 weeks           Patient will benefit from skilled therapeutic intervention in order to improve the following deficits and impairments:  Impaired flexibility, Decreased balance, Decreased activity tolerance, Decreased endurance, Decreased range of motion, Decreased strength, Impaired UE functional use, Postural dysfunction, Decreased scar mobility, Increased fascial restricitons, Pain  Visit Diagnosis: Chronic left shoulder pain  Stiffness of left shoulder, not elsewhere classified  Abnormal posture  Disorder of the skin and subcutaneous tissue related to radiation, unspecified  Malignant neoplasm of supraglottis ([facility])  Carcinoma larynx (H[facility])     Problem List Patient Active Problem List   Diagnosis Date Noted   Oral thrush 05/18/2020   Poor dentition 05/18/2020   Radiation-induced fibrosis of skin from therapeutic procedure 05/18/2020   Preventive measure 05/18/2020   Tracheostomy care ([place]) 03/30/2020   Hypoparathyroidism ([place]) 02/09/2020   Pancytopenia, acquired ([place]) 01/27/2020   Deficiency anemia 01/27/2020   Cancer associated pain 01/27/2020   Alaryngeal voice 12/15/2019   Goals of care, counseling/discussion 07/07/2019   Tracheostomy dependence ([place]) 04/05/2019   Hyponatremia 03/31/2019   Anemia due to antineoplastic chemotherapy 03/03/2019   Protein malnutrition ([place]) 03/03/2019   Port-A-Cath in place 02/10/2019   Laryngeal squamous cell carcinoma ([place]) 01/18/2019   Acute respiratory failure with hypoxia and hypercapnia ([place]) 12/03/2018   Hypothyroidism  12/03/2018   Laryngeal mass 12/03/2018   Type 2 MI (myocardial infarction) ([place]) 12/03/2018    Claris Pong, PT 08/07/2020, 6:29 PM  [place], [place], [address] Phone: [phone]   Fax:  [phone]  Name: [name] [name] MRN: [id number] Date of Birth: April 28, 1951

## 2020-08-15 ENCOUNTER — Encounter: Payer: Self-pay | Admitting: Physical Therapy

## 2020-08-15 ENCOUNTER — Other Ambulatory Visit: Payer: Self-pay

## 2020-08-15 ENCOUNTER — Ambulatory Visit: Payer: Medicare HMO | Admitting: Physical Therapy

## 2020-08-15 DIAGNOSIS — G8929 Other chronic pain: Secondary | ICD-10-CM | POA: Diagnosis not present

## 2020-08-15 DIAGNOSIS — M25512 Pain in left shoulder: Secondary | ICD-10-CM | POA: Diagnosis not present

## 2020-08-15 DIAGNOSIS — L599 Disorder of the skin and subcutaneous tissue related to radiation, unspecified: Secondary | ICD-10-CM

## 2020-08-15 DIAGNOSIS — R293 Abnormal posture: Secondary | ICD-10-CM | POA: Diagnosis not present

## 2020-08-15 DIAGNOSIS — M25612 Stiffness of left shoulder, not elsewhere classified: Secondary | ICD-10-CM | POA: Diagnosis not present

## 2020-08-15 DIAGNOSIS — C329 Malignant neoplasm of larynx, unspecified: Secondary | ICD-10-CM | POA: Diagnosis not present

## 2020-08-15 DIAGNOSIS — C321 Malignant neoplasm of supraglottis: Secondary | ICD-10-CM | POA: Diagnosis not present

## 2020-08-15 NOTE — Therapy (Signed)
[place], [place], [address] Phone: [phone]   Fax:  [phone]  Physical Therapy Treatment  Patient Details  Name: [name] [name] MRN: [id number] Date of Birth: 1951/06/10 Referring Provider (PT): Alvy Bimler   Encounter Date: 08/15/2020   PT End of Session - 08/15/20 1456    Visit Number 7    Number of Visits 17    Date for PT Re-Evaluation 08/28/20    PT Start Time 1404    PT Stop Time 1453    PT Time Calculation (min) 49 min    Activity Tolerance Patient tolerated treatment well    Behavior During Therapy [facility] for tasks assessed/performed           Past Medical History:  Diagnosis Date  . Cataract   . History of radiation therapy 02/11/19- 04/06/19   bilateral neck nodes and upper mediastinum  . Laryngeal cancer ([place]) 11/2018  . Thyroid disease     Past Surgical History:  Procedure Laterality Date  . CATARACT EXTRACTION, BILATERAL  left 05/20/16, right 06/03/16   Cataract Extraction w/ IOL Implant; Surgeon: Sudie Grumbling, MD; Location: [facility]; Service: Ophthalmology   . CESAREAN SECTION    . ESOPHAGOGASTRODUODENOSCOPY (EGD) WITH PROPOFOL N/A 02/01/2019   Procedure: ESOPHAGOGASTRODUODENOSCOPY (EGD) WITH PROPOFOL;  Surgeon: Milus Banister, MD;  [facility];  Service: Endoscopy;  Laterality: N/A;  . EUS N/A 02/01/2019   Procedure: UPPER ENDOSCOPIC ULTRASOUND (EUS) RADIAL-Will need Linear and Forward Viewing EUS Scope;  Surgeon: Milus Banister, MD;  [facility];  Service: Endoscopy;  Laterality: N/A;  . FINE NEEDLE ASPIRATION N/A 02/01/2019   Procedure: FINE NEEDLE ASPIRATION (FNA) LINEAR;  Surgeon: Milus Banister, MD;  [facility];  Service: Endoscopy;  Laterality: N/A;  . IR GASTROSTOMY TUBE MOD SED  02/09/2019  . IR IMAGING GUIDED PORT INSERTION  02/02/2019  . IR PATIENT EVAL TECH 0-60 MINS  01/06/2020  . IR RADIOLOGIST EVAL & MGMT  04/10/2020  .  KNEE CARTILAGE SURGERY Right 2019  . TRACHEOSTOMY  12/02/2018   [facility]    There were no vitals filed for this visit.   Subjective Assessment - 08/15/20 1406    Subjective I have been doing the pulleys and walking up the wall 3 times a day.    Pertinent History cancer of larynx and supraglottis; 12/03/18- larynx, supraglottis biopsy, chemo from May 20- July 20, 10/27-Total glossectomy, total pharyngectomy, totallaryngectomy, total thyroidectomy, and cervical esophagectomy,TUMOR EXTENSION: Tumor involves the posterior tongue, pharynx,larynx, tracheal cartilage, hyoid bone, cervical esophagus, vagusnerve, carotid artery, deep cervical fascia, ans surroundingskeletal muscle and soft tissue. Pt completed radiaiton.    Patient Stated Goals to be able to move shoulder better and decrease pain    Currently in Pain? No/denies    Pain Score 0-No pain                             [facility] Adult PT Treatment/Exercise - 08/15/20 0001      Manual Therapy   Soft tissue mobilization with cocoa butter in supine for STM to anterior shoulder and upper arm where multiplt trigger points palpable, then in Rt S/L for STM to Lt upper trap, medial and lateral scapular border trigger point release; also gentle scar tissue massage over superior aspect of scar, pt with increased tenderness at L upper trap but this improved with treatment today    Passive ROM in supine  in direction of flexion, abduction and ER- pt has pain with all motions but reports she always has pain in her shoulder during stretching                       PT Long Term Goals - 07/31/20 1604      PT LONG TERM GOAL #1   Title Pt will report she is able to sleep through the night and not wake up due to pain in L shoulder    Baseline has difficulty sleeping due to pain    Time 4    Period Weeks    Status On-going    Target Date 08/28/20      PT LONG TERM GOAL #2   Title Pt will be able to prepare meals without  being limited by her left shoulder ROM or pain    Time 4    Period Weeks    Status On-going    Target Date 08/28/20      PT LONG TERM GOAL #3   Title Pt will be independent in a home exercise program for all over strengthening and stretching for entire body.    Time 4    Period Weeks    Status Partially Met    Target Date 08/28/20      PT LONG TERM GOAL #4   Title Pt will demonstrate 90 degrees of left shoulder flexion and abduction to allow her to complete ADLs more easily.    Baseline flex: 61; abd: 56    Time 4    Period Weeks    Status On-going    Target Date 08/28/20      PT LONG TERM GOAL #5   Title Pt. will be able to perform 10 sit to stands in 20 seconds to decrease fall risk    Baseline 8    Time 4    Period Weeks    Status New    Target Date 08/28/20                 Plan - 08/15/20 1456    Clinical Impression Statement Pt has been compliant with a home exercise program 3x/day and her partner has been performing soft tissue mobilization. Continued with soft tissue mobilization to L shoulder musculature with increased time spent at upper trap where pt has stabbing pain during LUE motion. Overall pt's ROM is improving and has improved greatly since evaluation.    PT Frequency 2x / week    PT Duration 4 weeks    PT Treatment/Interventions ADLs/Self Care Home Management;Iontophoresis 95m/ml Dexamethasone;Therapeutic activities;Therapeutic exercise;Neuromuscular re-education;Patient/family education;Manual techniques;Scar mobilization;Passive range of motion;Taping;Dry needling    PT Next Visit Plan Cont gentle soft tissue work to entire left posterior shoulder and scapular muscles, do scapular mobilization, PROM L shoulder; do 30 sec sit to stand and add goal for this to work on overall strength since pt had recent fall    PT Home Exercise Plan Cont with her pulleys at home; yellow theraband for scap retract and er walk outs, wall climbs into flex    Consulted and  Agree with Plan of Care Patient           Patient will benefit from skilled therapeutic intervention in order to improve the following deficits and impairments:  Impaired flexibility, Decreased balance, Decreased activity tolerance, Decreased endurance, Decreased range of motion, Decreased strength, Impaired UE functional use, Postural dysfunction, Decreased scar mobility, Increased fascial restricitons, Pain  Visit Diagnosis: Chronic  left shoulder pain  Stiffness of left shoulder, not elsewhere classified  Abnormal posture  Disorder of the skin and subcutaneous tissue related to radiation, unspecified     Problem List Patient Active Problem List   Diagnosis Date Noted  . Oral thrush 05/18/2020  . Poor dentition 05/18/2020  . Radiation-induced fibrosis of skin from therapeutic procedure 05/18/2020  . Preventive measure 05/18/2020  . Tracheostomy care ([place]) 03/30/2020  . Hypoparathyroidism ([place]) 02/09/2020  . Pancytopenia, acquired ([place]) 01/27/2020  . Deficiency anemia 01/27/2020  . Cancer associated pain 01/27/2020  . Alaryngeal voice 12/15/2019  . Goals of care, counseling/discussion 07/07/2019  . Tracheostomy dependence ([place]) 04/05/2019  . Hyponatremia 03/31/2019  . Anemia due to antineoplastic chemotherapy 03/03/2019  . Protein malnutrition ([place]) 03/03/2019  . Port-A-Cath in place 02/10/2019  . Laryngeal squamous cell carcinoma ([place]) 01/18/2019  . Acute respiratory failure with hypoxia and hypercapnia ([place]) 12/03/2018  . Hypothyroidism 12/03/2018  . Laryngeal mass 12/03/2018  . Type 2 MI (myocardial infarction) ([place]) 12/03/2018    Allyson Sabal [facility] 08/15/2020, 2:59 PM  [place], [place], [address] Phone: [phone]   Fax:  [phone]  Name: [name] [name] MRN: [id number] Date of Birth: November 08, 1950  Manus Gunning, PT 08/15/20 2:59 PM

## 2020-08-18 ENCOUNTER — Inpatient Hospital Stay: Payer: Medicare HMO

## 2020-08-18 ENCOUNTER — Encounter: Payer: Self-pay | Admitting: Hematology and Oncology

## 2020-08-18 ENCOUNTER — Other Ambulatory Visit: Payer: Self-pay

## 2020-08-18 ENCOUNTER — Inpatient Hospital Stay (HOSPITAL_BASED_OUTPATIENT_CLINIC_OR_DEPARTMENT_OTHER): Payer: Medicare HMO | Admitting: Hematology and Oncology

## 2020-08-18 VITALS — BP 109/69 | HR 80 | Temp 97.7°F | Resp 17 | Ht 62.0 in | Wt 137.2 lb

## 2020-08-18 DIAGNOSIS — G893 Neoplasm related pain (acute) (chronic): Secondary | ICD-10-CM

## 2020-08-18 DIAGNOSIS — Z5112 Encounter for antineoplastic immunotherapy: Secondary | ICD-10-CM | POA: Diagnosis not present

## 2020-08-18 DIAGNOSIS — C329 Malignant neoplasm of larynx, unspecified: Secondary | ICD-10-CM

## 2020-08-18 DIAGNOSIS — K089 Disorder of teeth and supporting structures, unspecified: Secondary | ICD-10-CM

## 2020-08-18 DIAGNOSIS — Z95828 Presence of other vascular implants and grafts: Secondary | ICD-10-CM

## 2020-08-18 DIAGNOSIS — E039 Hypothyroidism, unspecified: Secondary | ICD-10-CM

## 2020-08-18 DIAGNOSIS — C321 Malignant neoplasm of supraglottis: Secondary | ICD-10-CM | POA: Diagnosis not present

## 2020-08-18 LAB — COMPREHENSIVE METABOLIC PANEL
ALT: 14 U/L (ref 0–44)
AST: 16 U/L (ref 15–41)
Albumin: 3.8 g/dL (ref 3.5–5.0)
Alkaline Phosphatase: 72 U/L (ref 38–126)
Anion gap: 9 (ref 5–15)
BUN: 20 mg/dL (ref 8–23)
CO2: 27 mmol/L (ref 22–32)
Calcium: 9.1 mg/dL (ref 8.9–10.3)
Chloride: 103 mmol/L (ref 98–111)
Creatinine, Ser: 0.77 mg/dL (ref 0.44–1.00)
GFR, Estimated: >60 mL/min (ref >60)
Glucose, Bld: 97 mg/dL (ref 70–99)
Potassium: 4 mmol/L (ref 3.5–5.1)
Sodium: 139 mmol/L (ref 135–145)
Total Bilirubin: 0.4 mg/dL (ref 0.3–1.2)
Total Protein: 7.1 g/dL (ref 6.5–8.1)

## 2020-08-18 LAB — CBC WITH DIFFERENTIAL/PLATELET
Abs Immature Granulocytes: 0.03 10*3/uL (ref 0.00–0.07)
Basophils Absolute: 0 10*3/uL (ref 0.0–0.1)
Basophils Relative: 0 %
Eosinophils Absolute: 0.3 10*3/uL (ref 0.0–0.5)
Eosinophils Relative: 6 %
HCT: 38.7 % (ref 36.0–46.0)
Hemoglobin: 12.8 g/dL (ref 12.0–15.0)
Immature Granulocytes: 1 %
Lymphocytes Relative: 25 %
Lymphs Abs: 1.1 10*3/uL (ref 0.7–4.0)
MCH: 29.5 pg (ref 26.0–34.0)
MCHC: 33.1 g/dL (ref 30.0–36.0)
MCV: 89.2 fL (ref 80.0–100.0)
Monocytes Absolute: 0.5 10*3/uL (ref 0.1–1.0)
Monocytes Relative: 12 %
Neutro Abs: 2.6 10*3/uL (ref 1.7–7.7)
Neutrophils Relative %: 56 %
Platelets: 212 10*3/uL (ref 150–400)
RBC: 4.34 MIL/uL (ref 3.87–5.11)
RDW: 12.9 % (ref 11.5–15.5)
WBC: 4.5 10*3/uL (ref 4.0–10.5)
nRBC: 0 % (ref 0.0–0.2)

## 2020-08-18 LAB — TSH: TSH: 2.019 u[IU]/mL (ref 0.308–3.960)

## 2020-08-18 LAB — T4, FREE: Free T4: 1.11 ng/dL (ref 0.61–1.12)

## 2020-08-18 MED ORDER — SODIUM CHLORIDE 0.9 % IV SOLN
Freq: Once | INTRAVENOUS | Status: AC
  Filled 2020-08-18: qty 250

## 2020-08-18 MED ORDER — SODIUM CHLORIDE 0.9% FLUSH
10.0000 mL | INTRAVENOUS | Status: DC | PRN
  Administered 2020-08-18: 10 mL
  Filled 2020-08-18: qty 10

## 2020-08-18 MED ORDER — SODIUM CHLORIDE 0.9 % IV SOLN
200.0000 mg | Freq: Once | INTRAVENOUS | Status: AC
  Administered 2020-08-18: 200 mg via INTRAVENOUS
  Filled 2020-08-18: qty 8

## 2020-08-18 MED ORDER — HEPARIN SOD (PORK) LOCK FLUSH 100 UNIT/ML IV SOLN
500.0000 [IU] | Freq: Once | INTRAVENOUS | Status: AC | PRN
  Administered 2020-08-18: 500 [IU]
  Filled 2020-08-18: qty 5

## 2020-08-18 MED ORDER — SODIUM CHLORIDE 0.9% FLUSH
10.0000 mL | Freq: Once | INTRAVENOUS | Status: AC
  Administered 2020-08-18: 10 mL
  Filled 2020-08-18: qty 10

## 2020-08-18 MED ORDER — HYDROCODONE-ACETAMINOPHEN 5-325 MG PO TABS: 1.0000 | ORAL_TABLET | Freq: Three times a day (TID) | ORAL | 0 refills | Status: DC | PRN

## 2020-08-18 NOTE — Progress Notes (Signed)
[place] OFFICE PROGRESS NOTE  Patient Care Team: Hamrick, Lorin Mercy, MD as PCP - General (Family Medicine) Eppie Gibson, MD as Attending Physician (Radiation Oncology) Leota Sauers, RN (Inactive) as Oncology Nurse Navigator Karie Mainland, RD as Dietitian (Nutrition) Valentino Saxon [place], CCC-SLP as Speech Language Pathologist (Speech Pathology) Malmfelt, Stephani Police, RN as Oncology Nurse Navigator (Oncology)  ASSESSMENT & PLAN:  Laryngeal squamous cell carcinoma [facility]) Her recent imaging studies showed stable disease control with single agent pembrolizumab I plan to repeat CT imaging in December  Cancer associated pain Her pain is well controlled We discussed narcotic refill policy    Poor dentition She had extreme poor dentition that was not taken care of prior to radiation treatment I will refer her to dental medicine for evaluation and possible extraction   Orders Placed This Encounter  Procedures   Ambulatory referral to Dentistry    Referral Priority:   Routine    Referral Type:   Consultation    Referral Reason:   Specialty Services Required    Requested Specialty:   Dental General Practice    Number of Visits Requested:   1    All questions were answered. The patient knows to call the clinic with any problems, questions or concerns. The total time spent in the appointment was 20 minutes encounter with patients including review of chart and various tests results, discussions about plan of care and coordination of care plan   Heath Lark, MD 08/18/2020 5:47 PM  INTERVAL HISTORY: Please see below for problem oriented charting. She returns with her son for further follow-up For some reason, she was not able to get her pain medicine refilled from her last visit Her oral thrush has resolved She is still very concerned about her dentition and is causing a lot of pain No recent infusion reactions She is doing well with physical therapy  SUMMARY OF  ONCOLOGIC HISTORY: Oncology History  Laryngeal squamous cell carcinoma ([place])  12/02/2018 Imaging   CT neck: IMPRESSION: 1. Supraglottic laryngeal carcinoma extending across the anterior commissure and invading the prelaryngeal fat, left aryepiglottic fold and left aspect of the epiglottis. 2. Severe narrowing of the laryngeal airway. 3. Bilateral subcentimeter level 2A and 2A cervical lymph nodes.     12/02/2018 Imaging   CT  Chest:  IMPRESSION: No evidence of metastatic disease in the chest.   12/03/2018 Pathology Results   (Care Everywhere; [facility]) A: Larynx, supraglottis, biopsy - Atypical squamous proliferation with at least low grade dysplasia and abundant keratinization - No definite high grade dysplasia or invasive carcinoma represented (see comment)   12/18/2018 Pathology Results   (Care Everywhere; [facility]) A:  Neck, left, fine needle aspiration - Rare single atypical cells, no cells diagnostic of malignancy - Lymph node material present  B:  Neck, right, fine needle aspiration - Non-diagnostic specimen - No lymph node material identified - Mixed inflammation and blood   12/31/2018 Pathology Results   (Care Everywhere; [facility])  A: Larynx, left supraglottic mass, biopsy - Atypical squamous proliferation with abundant keratinization, suspicious for well-differentiated invasive squamous cell carcinoma  B: Larynx, left supraglottic mass, biopsy - Well-differentiated invasive squamous cell carcinoma with abundant keratinization (see comment)   01/18/2019 Initial Diagnosis   Laryngeal cancer ([place])   01/26/2019 Imaging   1. Locally advanced hypermetabolic laryngeal neoplasm centered in the left supraglottic space with bilateral glottic and epiglottic involvement and subglottic extension on the left. 2. Hypermetabolic bilateral level 3 neck nodal  metastases. 3. Hypermetabolic high mediastinal nodal metastases between the trachea and upper thoracic  esophagus. 4. Otherwise no distant hypermetabolic metastatic disease. 5. Nodular 7 mm focus at the left lung base, below PET resolution, recommend attention on follow-up chest CT in 3 months. 6. Chronic findings include: Aortic Atherosclerosis (ICD10-I70.0). Cholelithiasis.   02/08/2019 Cancer Staging   Staging form: Larynx - Supraglottis, AJCC 8th Edition - Clinical stage from 02/08/2019: Stage IVA (cT3, cN2c, cM0) - Signed by Eppie Gibson, MD on 02/08/2019   02/11/2019 - 04/07/2019 Chemotherapy   The patient weekly cisplatin for chemotherapy treatment.     07/20/2019 Pathology Results   PROCEDURE: Total glossectomy, total pharyngectomy, total laryngectomy, total thyroidectomy, and cervical esophagectomy TUMOR SITE: Larynx TUMOR LATERALITY: Not specified TUMOR FOCALITY: Unifocal TUMOR SIZE:    GREATEST DIMENSION: 6.9 cm    ADDITIONAL DIMENSIONS: 5.4 x 2.4 cm HISTOLOGIC TYPE: Squamous cell carcinoma, conventional (keratinizing) HISTOLOGIC GRADE:  G1-G2: Well to moderately differentiated TUMOR EXTENSION: Tumor involves the posterior tongue, pharynx, larynx, tracheal cartilage, hyoid bone, cervical esophagus, vagus nerve, carotid artery, deep cervical fascia, ans surrounding skeletal muscle and soft tissue MARGINS: Uninvolved by invasive tumor; Perineural invasion is less than 1mm from the left soft tissue margin LYMPHOVASCULAR INVASION: Not identified PERINEURAL INVASION:  Present REGIONAL LYMPH NODES:     NUMBER OF LYMPH NODES INVOLVED: 2     NUMBER OF LYMPH NODES EXAMINED: 24     LATERALITY OF LYMPH NODES INVOLVED: Cannot determine     SIZE OF LARGEST METASTATIC DEPOSIT: 0.8 cm     EXTRANODAL EXTENSION: Not identified PATHOLOGIC STAGE CLASSIFICATION (pTNM, AJCC 8TH Ed): (r)(y) pT4b pN2a DISTANT METASTASIS (pM): ADDITIONAL PATHOLOGIC FINDINGS: Previous chemoradiation   11/04/2019 Imaging   PET: 1.  Hypermetabolic bilateral neck lesions as detailed above are concerning for  disease recurrence. 2.  Compared to 07/08/2019 PET scan, interval development of subcentimeter bilateral upper lobe pulmonary nodules, size below PET resolution. These are concerning for metastasis. Consider diagnostic quality CT chest exam for further evaluation as per oncology treatment planning. 3.  Focal hypermetabolic uptake in right ventricular cavity of indeterminate significance. Recommend echocardiographic examination to rule out underlying thrombus. This finding was discussed with Dr. Conley Canal by Dr. Clementeen Graham and Dr. Marcello Moores on 11/04/2019 at approximately 3:00 PM via telephone. 4.  Ancillary CT findings as above.   11/05/2019 Imaging   Echocardiogram: Summary   1. The left ventricle is normal in size with normal wall thickness.   2. The left ventricular systolic function is normal, LVEF is visually estimated at 60-65%.   3. The right ventricle is normal in size, with normal systolic function.   4. There is an extrinsic compression of the RV and RA laterally at the AV groove by a mass of 4.3 cm diameter. Unclear if the mass is invasive, although no pericardial effusion seen suggestive no invasion possibly. There is no evidence of hemodynamic compromise secondary to cardiac compression with normal IVC diameter.   12/06/2019 Pathology Results   A. LYMPH NODE, LEFT SUBMANDIBULAR, BIOPSY:  - Squamous cell carcinoma.  - No distinct nodal tissue identified.    12/06/2019 Procedure   Successful ultrasound-guided core biopsy of centrally necrotic left submandibular lymph node. Of note, due to extensive prior postsurgical and post radiation changes, patient has very limited mobility in the neck and the biopsy was extremely challenging    12/16/2019 -  Chemotherapy   The patient had pembrolizumab for chemotherapy treatment.     03/08/2020 PET scan   1. Diminished  size of masses in the neck as described. Persistent increased metabolic activity particularly in the LEFT neck. Some of this diminished  size on the LEFT could be due to decompression of necrotic material. Correlate with any symptoms of inflammation or infection in this area as well given the paucity of residual soft tissue associated with the above abnormality, overall findings are compatible with residual disease in this location and there is no significant surrounding stranding to indicate inflammation that would correlate with the degree of FDG uptake remaining. 2. Diminished activity in the juxta cardiac mass with peripheral activity suggesting interval necrosis, on the prior study this was uniformly hypermetabolic. There is still considerable FDG uptake. 3. No signs of disease in the abdomen or pelvis. 4. 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 Correlate with any new pain or symptoms in this area with further imaging as warranted. No discrete mass or bone destruction on today's study.   06/08/2020 Imaging   CT neck 1. Status post laryngectomy and neck dissection without lymphadenopathy or mass lesion. 2. Short segment severe stenosis of the left internal carotid artery adjacent to a surgical clip.     06/08/2020 Imaging   CT chest 1. There is a large, hypodense lesion within, or very closely abutting and likely intruding upon the right ventricle, as seen on prior examination and approximately 4.6 x 4.3 cm, poorly evaluated due to cardiac motion. Findings are consistent with metastatic lesion and/or thrombus and poorly assessed by ungated CT. Consider echocardiography and cardiac MRI for further assessment.   2. Multiple small pulmonary nodules in the bilateral lung apices are unchanged. Attention on follow-up.   3.  No evidence of new metastatic disease in the chest.   4.  Aortic Atherosclerosis (ICD10-I70.0).       REVIEW OF SYSTEMS:   Constitutional: Denies fevers, chills or abnormal weight loss Eyes: Denies blurriness of vision Ears, nose, mouth, throat, and face: Denies mucositis or sore throat Respiratory:  Denies cough, dyspnea or wheezes Cardiovascular: Denies palpitation, chest discomfort or lower extremity swelling Gastrointestinal:  Denies nausea, heartburn or change in bowel habits Skin: Denies abnormal skin rashes Lymphatics: Denies new lymphadenopathy or easy bruising Neurological:Denies numbness, tingling or new weaknesses Behavioral/Psych: Mood is stable, no new changes  All other systems were reviewed with the patient and are negative.  I have reviewed the past medical history, past surgical history, social history and family history with the patient and they are unchanged from previous note.  ALLERGIES:  has No Known Allergies.  MEDICATIONS:  Current Outpatient Medications  Medication Sig Dispense Refill   chlorhexidine (PERIDEX) 0.12 % solution Rinse with 15 mls twice daily for 30 seconds. Use after breakfast and at bedtime. Spit out excess. Do not swallow. 480 mL 3   HYDROcodone-acetaminophen (NORCO) 5-325 MG tablet Take 1 tablet by mouth every 8 (eight) hours as needed for moderate pain. 60 tablet 0   levothyroxine (SYNTHROID) 137 MCG tablet Take 137 mcg by mouth daily before breakfast.     lidocaine-prilocaine (EMLA) cream Apply to affected area once 30 g 3   morphine (MS CONTIN) 15 MG 12 hr tablet TAKE 1 TABLET BY MOUTH EVERYDAY AT BEDTIME     Nutritional Supplements (KATE FARMS PEPTIDE 1.5) LIQD Take 4 Bottles by mouth daily. 325 mL 11   ondansetron (ZOFRAN) 8 MG tablet Take 1 tablet (8 mg total) by mouth 2 (two) times daily as needed (Nausea or vomiting). 30 tablet 1   prochlorperazine (COMPAZINE) 10 MG tablet Take 1  tablet (10 mg total) by mouth every 6 (six) hours as needed (Nausea or vomiting). 30 tablet 1   sodium fluoride (PREVIDENT 5000 PLUS) 1.1 % CREA dental cream Apply cream to tooth brush. Brush teeth for 2 minutes. Spit out excess. DO NOT rinse afterwards. Repeat nightly. (Patient taking differently: Place 1 application onto teeth 2 (two) times a day. ) 1  Tube prn   No current facility-administered medications for this visit.   Facility-Administered Medications Ordered in Other Visits  Medication Dose Route Frequency Provider Last Rate Last Admin   sodium chloride flush (NS) 0.9 % injection 10 mL  10 mL Intracatheter PRN Alvy Bimler, Kevia Zaucha, MD   10 mL at 08/18/20 1257    PHYSICAL EXAMINATION: ECOG PERFORMANCE STATUS: 1 - Symptomatic but completely ambulatory  Vitals:   08/18/20 1050  BP: 109/69  Pulse: 80  Resp: 17  Temp: 97.7 F (36.5 C)  SpO2: 98%   Filed Weights   08/18/20 1050  Weight: 137 lb 3.2 oz (62.2 kg)    GENERAL:alert, no distress and comfortable SKIN: skin color, texture, turgor are normal, no rashes or significant lesions EYES: normal, Conjunctiva are pink and non-injected, sclera clear OROPHARYNX: Noted extreme poor dentition.  Oral thrush has resolved NECK: Tracheostomy site looks okay   LABORATORY DATA:  I have reviewed the data as listed    Component Value Date/Time   NA 139 08/18/2020 1040   K 4.0 08/18/2020 1040   CL 103 08/18/2020 1040   CO2 27 08/18/2020 1040   GLUCOSE 97 08/18/2020 1040   BUN 20 08/18/2020 1040   CREATININE 0.77 08/18/2020 1040   CREATININE 0.60 02/17/2020 1128   CALCIUM 9.1 08/18/2020 1040   PROT 7.1 08/18/2020 1040   ALBUMIN 3.8 08/18/2020 1040   AST 16 08/18/2020 1040   AST 22 02/17/2020 1128   ALT 14 08/18/2020 1040   ALT 24 02/17/2020 1128   ALKPHOS 72 08/18/2020 1040   BILITOT 0.4 08/18/2020 1040   BILITOT 0.5 02/17/2020 1128   GFRNONAA >60 08/18/2020 1040   GFRNONAA >60 02/17/2020 1128   GFRAA >60 06/08/2020 1303   GFRAA >60 02/17/2020 1128    No results found for: SPEP, UPEP  Lab Results  Component Value Date   WBC 4.5 08/18/2020   NEUTROABS 2.6 08/18/2020   HGB 12.8 08/18/2020   HCT 38.7 08/18/2020   MCV 89.2 08/18/2020   PLT 212 08/18/2020      Chemistry      Component Value Date/Time   NA 139 08/18/2020 1040   K 4.0 08/18/2020 1040   CL 103  08/18/2020 1040   CO2 27 08/18/2020 1040   BUN 20 08/18/2020 1040   CREATININE 0.77 08/18/2020 1040   CREATININE 0.60 02/17/2020 1128      Component Value Date/Time   CALCIUM 9.1 08/18/2020 1040   ALKPHOS 72 08/18/2020 1040   AST 16 08/18/2020 1040   AST 22 02/17/2020 1128   ALT 14 08/18/2020 1040   ALT 24 02/17/2020 1128   BILITOT 0.4 08/18/2020 1040   BILITOT 0.5 02/17/2020 1128

## 2020-08-18 NOTE — Progress Notes (Signed)
[facility] the [facility] in stable condition and with no signs of acute distress.    

## 2020-08-18 NOTE — Assessment & Plan Note (Signed)
She had extreme poor dentition that was not taken care of prior to radiation treatment I will refer her to dental medicine for evaluation and possible extraction

## 2020-08-18 NOTE — Patient Instructions (Signed)
[facility] Discharge Instructions for Patients Receiving Chemotherapy  Today you received the following chemotherapy agents :  Keytruda.  To help prevent nausea and vomiting after your treatment, we encourage you to take your nausea medication as prescribed.   If you develop nausea and vomiting that is not controlled by your nausea medication, call the clinic.   BELOW ARE SYMPTOMS THAT SHOULD BE REPORTED IMMEDIATELY:  *FEVER GREATER THAN 100.5 F  *CHILLS WITH OR WITHOUT FEVER  NAUSEA AND VOMITING THAT IS NOT CONTROLLED WITH YOUR NAUSEA MEDICATION  *UNUSUAL SHORTNESS OF BREATH  *UNUSUAL BRUISING OR BLEEDING  TENDERNESS IN MOUTH AND THROAT WITH OR WITHOUT PRESENCE OF ULCERS  *URINARY PROBLEMS  *BOWEL PROBLEMS  UNUSUAL RASH Items with * indicate a potential emergency and should be followed up as soon as possible.  Feel free to call the clinic should you have any questions or concerns. The clinic phone number is [phone].  Please show the CHEMO ALERT CARD at check-in to the Emergency Department and triage nurse.  

## 2020-08-18 NOTE — Assessment & Plan Note (Signed)
Her pain is well controlled We discussed narcotic refill policy

## 2020-08-18 NOTE — Assessment & Plan Note (Signed)
Her recent imaging studies showed stable disease control with single agent pembrolizumab I plan to repeat CT imaging in December

## 2020-08-22 ENCOUNTER — Ambulatory Visit: Payer: Medicare HMO | Admitting: Physical Therapy

## 2020-08-22 ENCOUNTER — Telehealth: Payer: Self-pay

## 2020-08-22 NOTE — Telephone Encounter (Signed)
RN placed call to check status of referral that was placed to [facility] called [phone].   Voicemail left requesting return call on referral status.

## 2020-08-22 NOTE — Telephone Encounter (Signed)
-----   Message from Heath Lark, MD sent at 08/22/2020  9:32 AM EST ----- Regarding: dentistry referral Can you check on status of dentistry referral?

## 2020-08-28 ENCOUNTER — Ambulatory Visit: Payer: Medicare HMO | Admitting: Physical Therapy

## 2020-08-30 DIAGNOSIS — Z430 Encounter for attention to tracheostomy: Secondary | ICD-10-CM | POA: Diagnosis not present

## 2020-09-05 ENCOUNTER — Telehealth: Payer: Self-pay | Admitting: *Deleted

## 2020-09-05 ENCOUNTER — Other Ambulatory Visit: Payer: Self-pay | Admitting: *Deleted

## 2020-09-05 ENCOUNTER — Other Ambulatory Visit: Payer: Self-pay | Admitting: Hematology and Oncology

## 2020-09-05 DIAGNOSIS — R918 Other nonspecific abnormal finding of lung field: Secondary | ICD-10-CM | POA: Insufficient documentation

## 2020-09-05 DIAGNOSIS — C329 Malignant neoplasm of larynx, unspecified: Secondary | ICD-10-CM

## 2020-09-05 NOTE — Progress Notes (Signed)
Called centralized scheduling to switch previous CT scan that was not approved to approved CT scan. Appt was changed and confirmed.

## 2020-09-05 NOTE — Telephone Encounter (Addendum)
Called pt spouse to make aware, pt does not need to drink contrast. Pt spouse verbalized understanding.

## 2020-09-07 ENCOUNTER — Other Ambulatory Visit: Payer: Self-pay

## 2020-09-07 ENCOUNTER — Ambulatory Visit (HOSPITAL_COMMUNITY)
Admission: RE | Admit: 2020-09-07 | Discharge: 2020-09-07 | Disposition: A | Discharge status: Home / Self Care | Payer: Medicare HMO | Source: Ambulatory Visit | Attending: Hematology and Oncology | Admitting: Hematology and Oncology

## 2020-09-07 ENCOUNTER — Ambulatory Visit (HOSPITAL_COMMUNITY): Payer: Self-pay | Admitting: Dentistry

## 2020-09-07 DIAGNOSIS — K0889 Other specified disorders of teeth and supporting structures: Secondary | ICD-10-CM

## 2020-09-07 DIAGNOSIS — M5134 Other intervertebral disc degeneration, thoracic region: Secondary | ICD-10-CM | POA: Diagnosis not present

## 2020-09-07 DIAGNOSIS — K036 Deposits [accretions] on teeth: Secondary | ICD-10-CM | POA: Diagnosis not present

## 2020-09-07 DIAGNOSIS — K08409 Partial loss of teeth, unspecified cause, unspecified class: Secondary | ICD-10-CM

## 2020-09-07 DIAGNOSIS — C329 Malignant neoplasm of larynx, unspecified: Secondary | ICD-10-CM | POA: Diagnosis not present

## 2020-09-07 DIAGNOSIS — M264 Malocclusion, unspecified: Secondary | ICD-10-CM

## 2020-09-07 DIAGNOSIS — I700 Atherosclerosis of aorta: Secondary | ICD-10-CM | POA: Diagnosis not present

## 2020-09-07 DIAGNOSIS — R918 Other nonspecific abnormal finding of lung field: Secondary | ICD-10-CM | POA: Diagnosis present

## 2020-09-07 DIAGNOSIS — K045 Chronic apical periodontitis: Secondary | ICD-10-CM

## 2020-09-07 DIAGNOSIS — I513 Intracardiac thrombosis, not elsewhere classified: Secondary | ICD-10-CM | POA: Diagnosis not present

## 2020-09-07 DIAGNOSIS — K029 Dental caries, unspecified: Secondary | ICD-10-CM | POA: Diagnosis not present

## 2020-09-07 DIAGNOSIS — K0530 Chronic periodontitis, unspecified: Secondary | ICD-10-CM

## 2020-09-07 DIAGNOSIS — M2632 Excessive spacing of fully erupted teeth: Secondary | ICD-10-CM

## 2020-09-07 DIAGNOSIS — M2630 Unspecified anomaly of tooth position of fully erupted tooth or teeth: Secondary | ICD-10-CM

## 2020-09-07 IMAGING — CT CT CHEST W/ CM
2 of 4 series · 15 of 36 positions shown, 18 images · IV contrast (omnipaque)
Comparison: [DATE]

CLINICAL DATA: Restaging laryngeal carcinoma.

EXAM:
CT CHEST WITH CONTRAST
TECHNIQUE: Multidetector CT imaging of the chest was performed during
intravenous contrast administration.
CONTRAST:  75mL OMNIPAQUE IOHEXOL 300 MG/ML  SOLN

[Series 2: axial st · axial · 0.71mm/px · z∈[+157,+387]mm · 12 of 137 slices shown, 15 images]
[im 11/137  mediastinal]
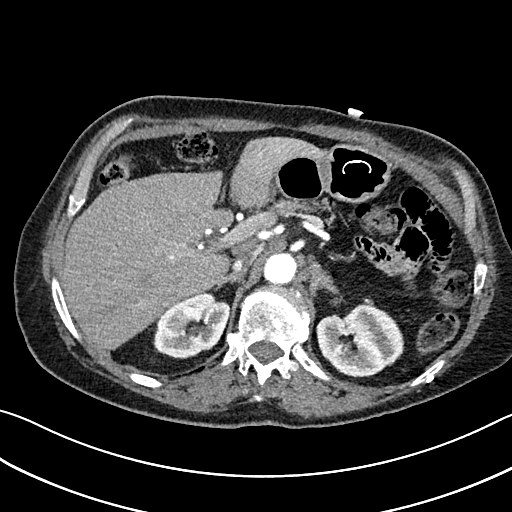
[im 11/137  lung]
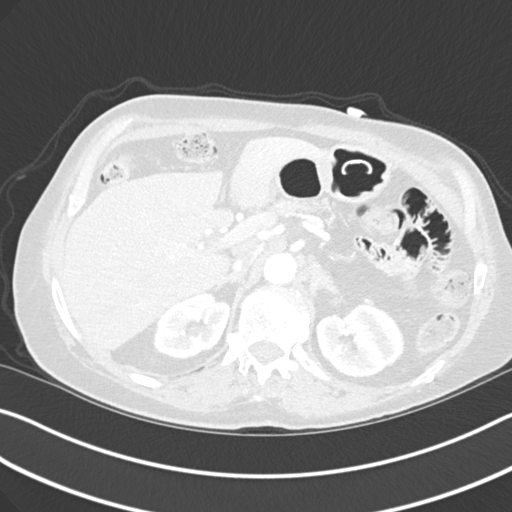
[im 21/137  lung]
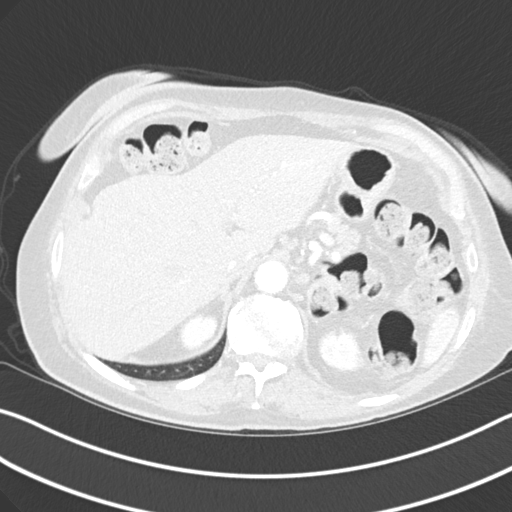
[im 32/137  lung]
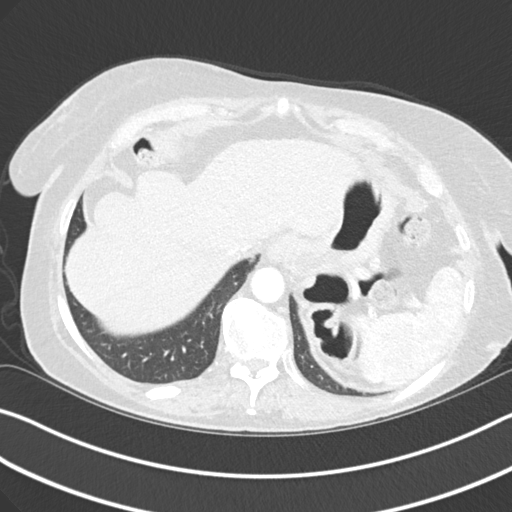
[im 42/137  lung]
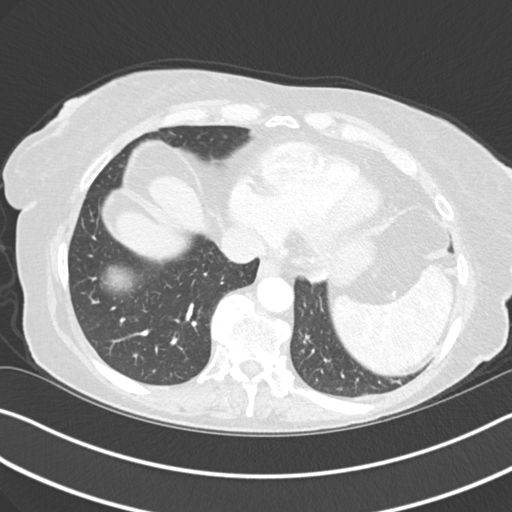
[im 53/137  mediastinal]
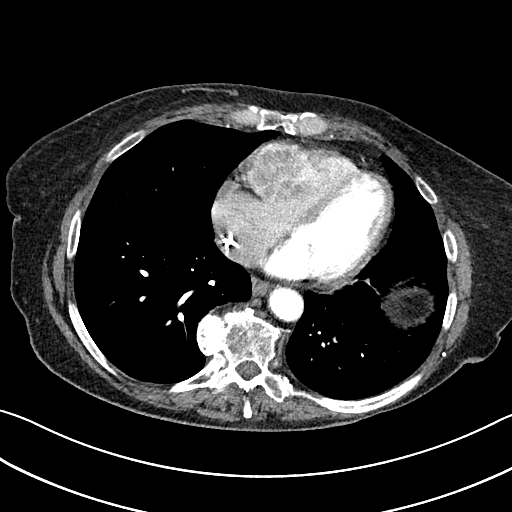
[im 53/137  lung]
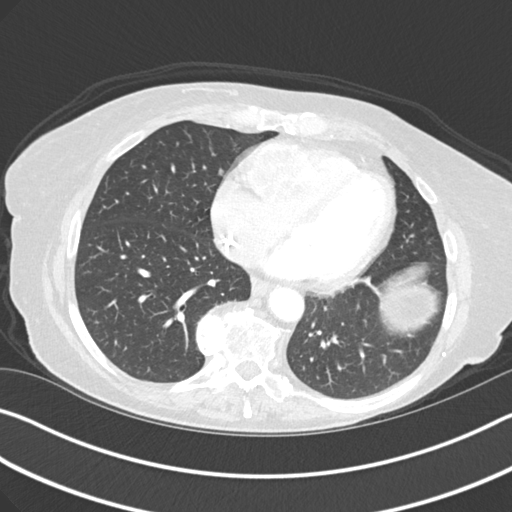
[im 63/137  lung]
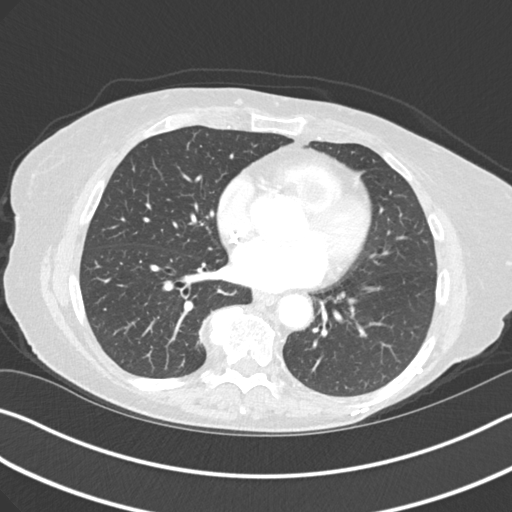
[im 74/137  lung]
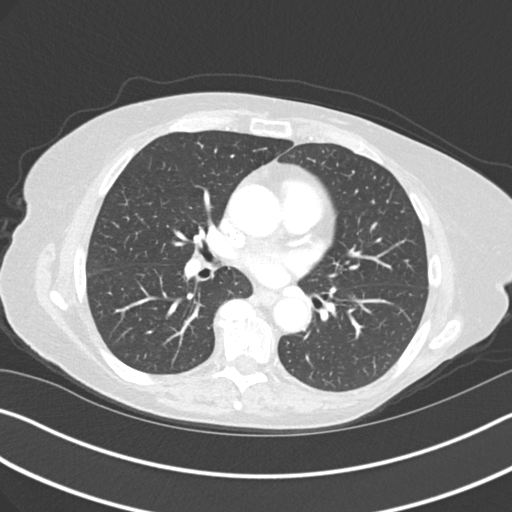
[im 84/137  lung]
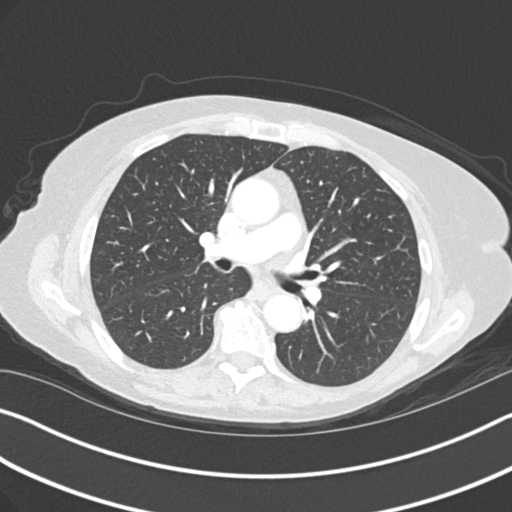
[im 95/137  mediastinal]
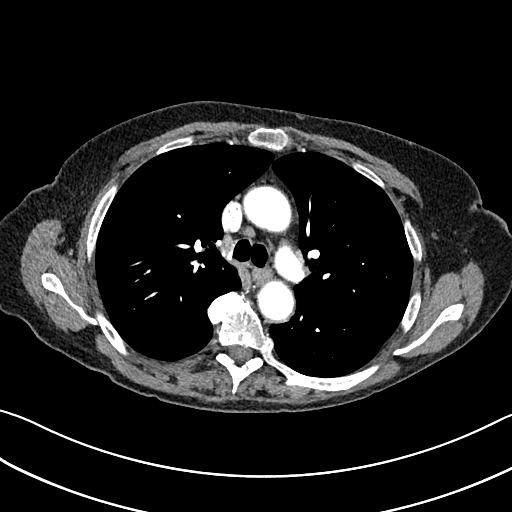
[im 95/137  lung]
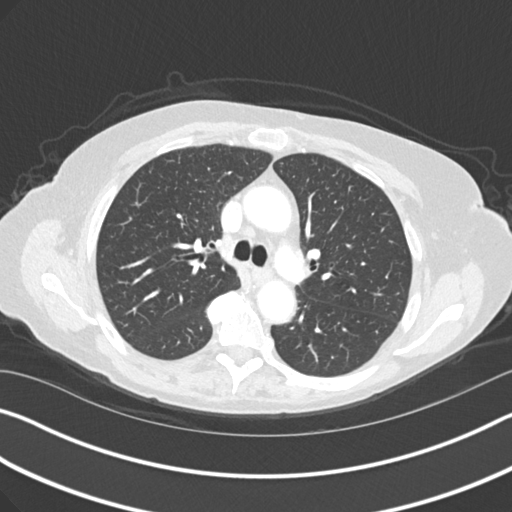
[im 105/137  lung]
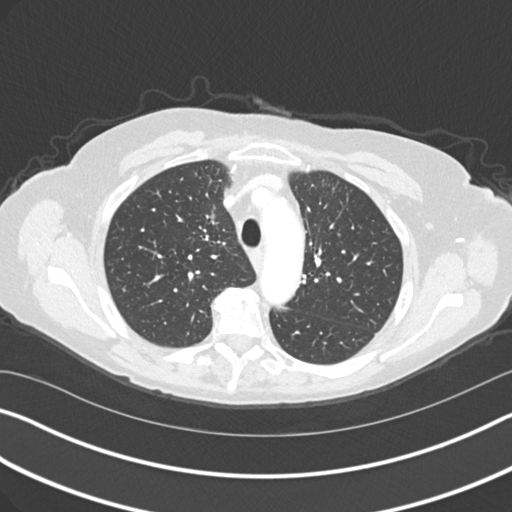
[im 116/137  lung]
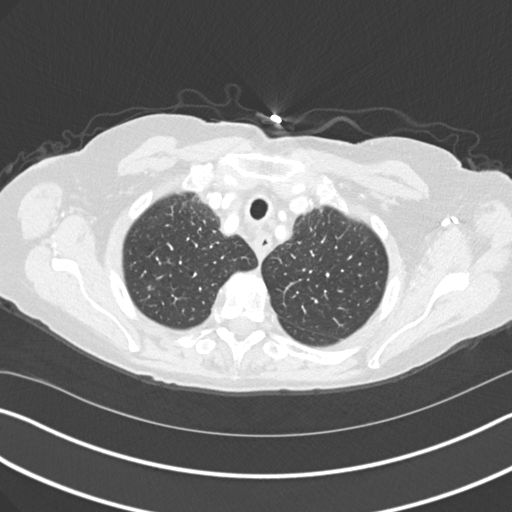
[im 126/137  lung]
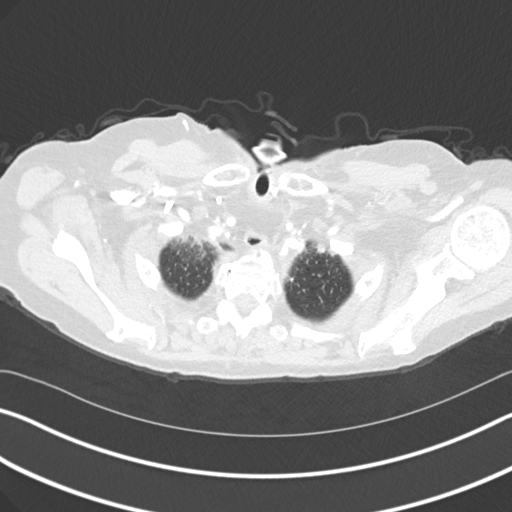

[Series 6: coronal · coronal · 0.59mm/px · 3 of 119 slices shown]
[im 24/119  lung]
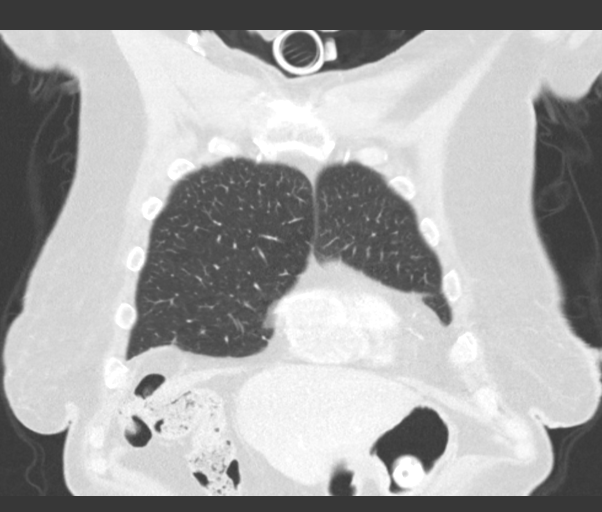
[im 48/119  lung]
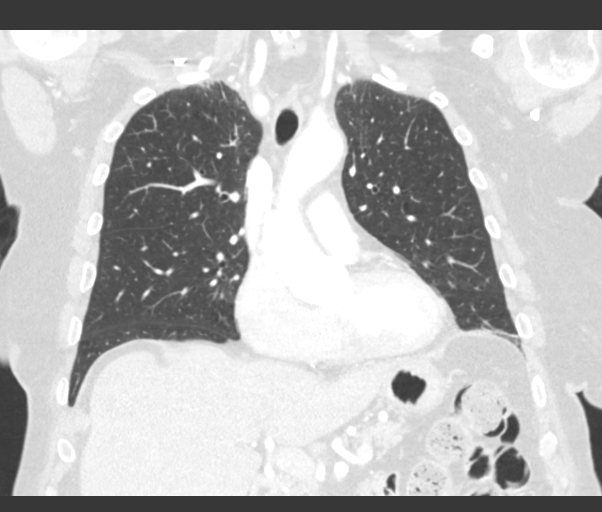
[im 71/119  lung]
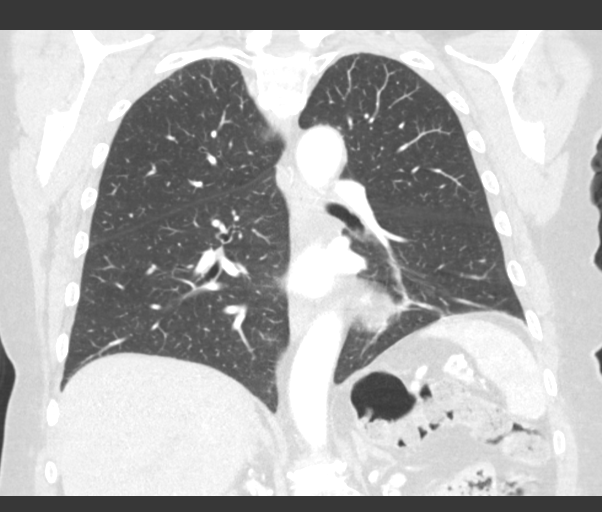

[15 of 36 positions shown; findings below may reference images not displayed]

FINDINGS: Cardiovascular: The heart size appears within normal limits. No
pericardial effusion. Juxta cardiac mass with intrusion upon the
right ventricle is again noted. This measures 4.8 x 3.8 x 4.3 cm
(volume = 41 cm^3), image 93/2. Previously this measured 5.5 by
4.4 by 4.4 cm (volume = 56 cm^3). Tumor/bland thrombus is again
identified extending into the right ventricle and right ventricular
outflow tract, image 36/6. Aortic atherosclerosis.

Mediastinum/Nodes: Tracheostomy tube tip is above the carina. Normal
appearance of the esophagus. The thyroid gland appears surgically
absent. No axillary, supraclavicular, mediastinal, or hilar
adenopathy.

Lungs/Pleura: No pleural effusion, airspace consolidation or
atelectasis. Several small pulmonary nodules are again noted. Index
nodule within the anterior left apex measures 6 mm, image [DATE].
Unchanged. 5 mm right upper lobe lung nodule is unchanged, image
[DATE]. Unchanged lateral right upper lobe lung nodule measures 4 mm,
image [DATE].

Upper Abdomen: No acute findings. Large gallstone is identified
measuring 1.9 cm. Gastrostomy tube is identified

Musculoskeletal: Multilevel degenerative disc disease identified
within the thoracic spine. There is no acute or suspicious osseous
lesions
IMPRESSION: 1. Mild decrease in size of juxta cardiac metastatic involving the
right ventricle.
2. Small pulmonary nodules are unchanged from previous exam.
3. Aortic atherosclerosis.
4. Gallstone.

Aortic Atherosclerosis ([LS]-[LS]).

## 2020-09-07 MED ORDER — IOHEXOL 300 MG/ML  SOLN
75.0000 mL | Freq: Once | INTRAMUSCULAR | Status: AC | PRN
  Administered 2020-09-07: 75 mL via INTRAVENOUS

## 2020-09-07 MED ORDER — SODIUM CHLORIDE (PF) 0.9 % IJ SOLN
INTRAMUSCULAR | Status: AC
  Filled 2020-09-07: qty 50

## 2020-09-07 NOTE — Patient Instructions (Signed)
[facility] [facility] of Dental Medicine Dr. Debe Coder B. Sharline Lehane Phone: [phone] Fax: [phone]    It was a pleasure seeing you today!  Please refer to the information below regarding your dental visit, and call us should you have any questions or concerns that may come up after you leave.    We will plan to schedule you for the operating room to complete all dental treatment under general anesthesia.  It will be scheduled for Thursday, February 3rd, 2022.  We will let you know if anything changes or if we need anything else from you before then.    Thank you for giving [place] the opportunity to provide care for you.  If there is anything we can do for you, please let us know.

## 2020-09-07 NOTE — Progress Notes (Signed)
DENTAL VISIT OUTPATIENT CONSULTATION  Service Date:   09/07/2020 Referring Provider:                  Heath Lark, MD  Patient Name:   [name] [name] Date of Birth:   08/13/1951 Medical Record Number: [id number]    PLAN & RECOMMENDATIONS   >> The patient has multiple infected teeth and severe periodontal disease.  >> Recommend full mouth extractions to prevent pain and systemic infection. >> We plan to schedule the patient for extractions and dental treatment at the beginning of February 2022 at the patient's request. Recommend treatment in the OR due to patient's dental phobia and high aspiration risk.    >> Recommend the patient establish care at a dental office of her choice for routine dental care including replacement of missing teeth and exams after she has healed. >> Discussed in detail all treatment options with the patient and she is agreeable to the plan.   Thank you for consulting with [facility] and for the opportunity to participate in this patient's treatment.  Should you have any questions or concerns, please contact the [facility] at [phone].     Consult Note:  09/07/2020    COVID 19 SCREENING: The patient denies symptoms concerning for COVID-19 infection including fever, chills, cough, or newly developed shortness of breath.   HPI: [name] [name] is a very pleasant 69 y.o. 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  She was referred to our clinic due to very poor dentition and concern for infection.   Dental History: The patient is a patient of record at the [facility].  She was last seen in 01/2019 for a Pre-XRT consult by Dr. Enrique Sack.  At that time, he recommended full mouth extractions, however radiation oncology deferred dental treatment due to the urgency of starting  radiation.  She has not been to a dentist since that time.  She states that several of her teeth have shifted and she is unable to bite down normally.  She currently denies any dental/oral pain or sensitivity. Patient able to manage oral secretions.  Patient denies dysphagia, odynophagia, dysphonia, SOB and neck pain.  Patient denies fever, rigors and malaise.   CHIEF COMPLAINT: "I do not have any pain, but my bottom teeth have moved."   Patient Active Problem List   Diagnosis Date Noted  . Lung mass 09/05/2020  . Oral thrush 05/18/2020  . Poor dentition 05/18/2020  . Radiation-induced fibrosis of skin from therapeutic procedure 05/18/2020  . Preventive measure 05/18/2020  . Tracheostomy care ([place]) 03/30/2020  . Hypoparathyroidism ([place]) 02/09/2020  . Pancytopenia, acquired ([place]) 01/27/2020  . Deficiency anemia 01/27/2020  . Cancer associated pain 01/27/2020  . Alaryngeal voice 12/15/2019  . Goals of care, counseling/discussion 07/07/2019  . Tracheostomy dependence ([place]) 04/05/2019  . Hyponatremia 03/31/2019  . Anemia due to antineoplastic chemotherapy 03/03/2019  . Protein malnutrition ([place]) 03/03/2019  . Port-A-Cath in place 02/10/2019  . Laryngeal squamous cell carcinoma ([place]) 01/18/2019  . Acute respiratory failure with hypoxia and hypercapnia ([place]) 12/03/2018  . Hypothyroidism 12/03/2018  . Laryngeal mass 12/03/2018  . Type 2 MI (myocardial infarction) ([place]) 12/03/2018   Past Medical History:  Diagnosis Date  . Cataract   . History of radiation therapy 02/11/19- 04/06/19   bilateral neck nodes and upper mediastinum  . Laryngeal cancer ([place]) 11/2018  .  Thyroid disease    Past Surgical History:  Procedure Laterality Date  . CATARACT EXTRACTION, BILATERAL  left 05/20/16, right 06/03/16   Cataract Extraction w/ IOL Implant; Surgeon: Sudie Grumbling, MD; Location: [facility]; Service: Ophthalmology   . CESAREAN SECTION    . ESOPHAGOGASTRODUODENOSCOPY (EGD) WITH PROPOFOL N/A  02/01/2019   Procedure: ESOPHAGOGASTRODUODENOSCOPY (EGD) WITH PROPOFOL;  Surgeon: Milus Banister, MD;  Location: [facility];  Service: Endoscopy;  Laterality: N/A;  . EUS N/A 02/01/2019   Procedure: UPPER ENDOSCOPIC ULTRASOUND (EUS) RADIAL-Will need Linear and Forward Viewing EUS Scope;  Surgeon: Milus Banister, MD;  Location: [facility];  Service: Endoscopy;  Laterality: N/A;  . FINE NEEDLE ASPIRATION N/A 02/01/2019   Procedure: FINE NEEDLE ASPIRATION (FNA) LINEAR;  Surgeon: Milus Banister, MD;  Location: [facility];  Service: Endoscopy;  Laterality: N/A;  . IR GASTROSTOMY TUBE MOD SED  02/09/2019  . IR IMAGING GUIDED PORT INSERTION  02/02/2019  . IR PATIENT EVAL TECH 0-60 MINS  01/06/2020  . IR RADIOLOGIST EVAL & MGMT  04/10/2020  . KNEE CARTILAGE SURGERY Right 2019  . TRACHEOSTOMY  12/02/2018   [facility]   No Known Allergies Current Outpatient Medications  Medication Sig Dispense Refill  . chlorhexidine (PERIDEX) 0.12 % solution Rinse with 15 mls twice daily for 30 seconds. Use after breakfast and at bedtime. Spit out excess. Do not swallow. 480 mL 3  . HYDROcodone-acetaminophen (NORCO) 5-325 MG tablet Take 1 tablet by mouth every 8 (eight) hours as needed for moderate pain. 60 tablet 0  . levothyroxine (SYNTHROID) 137 MCG tablet Take 137 mcg by mouth daily before breakfast.    . lidocaine-prilocaine (EMLA) cream Apply to affected area once 30 g 3  . morphine (MS CONTIN) 15 MG 12 hr tablet TAKE 1 TABLET BY MOUTH EVERYDAY AT BEDTIME    . Nutritional Supplements (KATE FARMS PEPTIDE 1.5) LIQD Take 4 Bottles by mouth daily. 325 mL 11  . ondansetron (ZOFRAN) 8 MG tablet Take 1 tablet (8 mg total) by mouth 2 (two) times daily as needed (Nausea or vomiting). 30 tablet 1  . prochlorperazine (COMPAZINE) 10 MG tablet Take 1 tablet (10 mg total) by mouth every 6 (six) hours as needed (Nausea or vomiting). 30 tablet 1  . sodium fluoride (PREVIDENT 5000 PLUS) 1.1 % CREA dental cream Apply  cream to tooth brush. Brush teeth for 2 minutes. Spit out excess. DO NOT rinse afterwards. Repeat nightly. (Patient taking differently: Place 1 application onto teeth 2 (two) times a day. ) 1 Tube prn   No current facility-administered medications for this visit.    LABS: Lab Results  Component Value Date   WBC 4.5 08/18/2020   HGB 12.8 08/18/2020   HCT 38.7 08/18/2020   MCV 89.2 08/18/2020   PLT 212 08/18/2020      Component Value Date/Time   NA 139 08/18/2020 1040   K 4.0 08/18/2020 1040   CL 103 08/18/2020 1040   CO2 27 08/18/2020 1040   GLUCOSE 97 08/18/2020 1040   BUN 20 08/18/2020 1040   CREATININE 0.77 08/18/2020 1040   CREATININE 0.60 02/17/2020 1128   CALCIUM 9.1 08/18/2020 1040   GFRNONAA >60 08/18/2020 1040   GFRNONAA >60 02/17/2020 1128   GFRAA >60 06/08/2020 1303   GFRAA >60 02/17/2020 1128   Lab Results  Component Value Date   INR 1.1 12/06/2019   INR 1.0 02/09/2019   INR 1.0 02/02/2019   No results found for: PTT  Social History  Socioeconomic History  . Marital status: Significant Other    Spouse name: [name]  . Number of children: 1  . Years of education: Not on file  . Highest education level: Not on file  Occupational History  . Not on file  Tobacco Use  . Smoking status: Former Smoker    Types: Cigarettes    Quit date: 12/02/2018    Years since quitting: 1.7  . Smokeless tobacco: Never Used  Vaping Use  . Vaping Use: Never used  Substance and Sexual Activity  . Alcohol use: Not Currently  . Drug use: Never  . Sexual activity: Not on file  Other Topics Concern  . Not on file  Social History Narrative  . Not on file   Social Determinants of Health   Financial Resource Strain: Not on file  Food Insecurity: Not on file  Transportation Needs: Not on file  Physical Activity: Not on file  Stress: Not on file  Social Connections: Not on file  Intimate Partner Violence: Not on file   Family History  Problem Relation Age of  Onset  . Cancer Mother   . Heart attack Father      REVIEW OF SYSTEMS: Reviewed with the patient as per HPI. PSYCH: (+) Dental phobia   VITAL SIGNS: BP 113/71 (BP Location: Right Arm)   Pulse 71   Temp (!) 94.3 F (34.6 C) (Axillary)    PHYSICAL EXAMINATION: GENERAL: Well-developed, comfortable and in no apparent distress. She has a tracheostomy in place. NEUROLOGICAL: Alert and oriented to person, place and time. EXTRAORAL:  Facial symmetry present without any edema or erythema.  No swelling or lymphadenopathy. TMJ asymptomatic without clicks or crepitations.  Tracheostomy in place.  Patient reports no problems with trismus following radiation.  INTRAORAL: Soft tissues appear well-perfused and mucous membranes moist.  FOM and vestibules soft and not raised. Oral cavity without mass or lesion. No signs of infection, parulis, sinus tract, edema or erythema evident upon exam.  DENTAL EXAMINATION: DENTITION: Overall poor remaining dentition.  Missing teeth, few teeth with restorations PERIODONTAL: Very inflamed, erythematous gingival tissue, particularly of the lower anterior teeth.  Class II-III mobility of almost all remaining teeth.  Generalized heavy plaque and calculus accumulation.   DENTAL CARIES: Multiple dental caries unchanged from previous exam. PROSTHODONTIC: Patient denies having partial dentures, but does express interest in having some fabricated in the future. OCCLUSION: Unable to assess occlusion due to the severity of shifting of remaining teeth. Multiple teeth that are supra-erupted and out of their natural position. Slightly collapsed VDO d/t missing posterior teeth. Severe malocclusion.  RADIOGRAPHIC EXAMINATION PAN exposed and interpreted: Condyles seated bilaterally in fossas.  No evidence of abnormal pathology.  All visualized osseous structures appear WNL. Small, radiopaque regions located bilaterally in area of the thyroid consistent with past  surgery. Generalized severe horizontal bone loss consistent with severe periodontitis. Teeth #23 - #29 have severe horizontal bone loss and have all drifted mesially d/t no bone support. Significant radiographic accumulation evident. Missing teeth, caries, existing restorations. #3, #4, #6, #7, #8, #9, #10 and #14 supra-erupted.   ASSESSMENT 1. SCC of the supraglottic larynx s/p chemoradiation 2. Hypothyroidism and Hypoparathyroidism 3. Dental phobia 4. Missing teeth 5. Caries 6. Chronic apical periodontitis 7. Chronic periodontitis 8. Accretions on teeth 9. Loose teeth 10. Anomalies of tooth position 11. Diastemas 12. Malocclusion   PLAN/RECOMMENDATIONS  . I discussed the risks, benefits, and complications (including the very low, but still possible risk for osteoradionecrosis of  the jaw and delayed healing) of various treatment options with the patient in relationship to her medical and dental conditions. I explained to the patient that I do agree with Dr. Ritta Slot previous treatment plan to extract all of her remaining teeth due to the severity of her gum disease and tooth decay.  I explained that the reason her teeth have shifted so much is likely due to her gum disease worsening and the loss of bone surrounding the teeth.  She also has heavy tartar accumulation above and underneath her gums which is causing constant inflammation and subsequent bone loss.  I discussed that although there is still a risk of ORN or bone infection if she has all of her teeth out, she is just as likely if not more likely to get ORN if she leaves her teeth because her periodontal infection is eating away her bone and placing her at risk which will only get worse.  Her radiation was also below her oral cavity and the main field was her larynx, so she will hopefully heal normally without any issues.  She would also be able to go to an outside dental office following healing to have some dentures made. . We  discussed various treatment options to include no treatment, multiple extractions with alveoloplasty, pre-prosthetic surgery as indicated, and replacement of missing teeth as indicated.  The patient verbalized understanding of all options, and currently wishes to proceed with full mouth extractions and alveoloplasty as needed.  Recommend treatment be completed in the OR due to extent of work that needs to be done and patient's dental phobia.  . Discussion of findings with medical team and coordination of future medical and dental care as needed.   Per the patient's request, we will schedule her for the OR in early February of next year.  Patient instructed to call should she have any questions or concerns before then.   The patient tolerated today's visit well.  All questions and concerns were addressed and the patient departed in stable condition.   I spent in excess of 120 minutes during the conduct of this consultation and >50% of this time involved direct face-to-face encounter for counseling and/or coordination of the patient's care.   Scott Benson [place], DMD

## 2020-09-08 ENCOUNTER — Inpatient Hospital Stay: Payer: Medicare HMO

## 2020-09-08 ENCOUNTER — Other Ambulatory Visit: Payer: Self-pay

## 2020-09-08 ENCOUNTER — Encounter: Payer: Self-pay | Admitting: Hematology and Oncology

## 2020-09-08 ENCOUNTER — Inpatient Hospital Stay: Payer: Medicare HMO | Attending: Hematology

## 2020-09-08 ENCOUNTER — Inpatient Hospital Stay (HOSPITAL_BASED_OUTPATIENT_CLINIC_OR_DEPARTMENT_OTHER): Payer: Medicare HMO | Admitting: Hematology and Oncology

## 2020-09-08 VITALS — BP 109/76 | HR 75 | Temp 97.7°F | Resp 18 | Ht 62.0 in | Wt 137.4 lb

## 2020-09-08 DIAGNOSIS — K089 Disorder of teeth and supporting structures, unspecified: Secondary | ICD-10-CM | POA: Insufficient documentation

## 2020-09-08 DIAGNOSIS — Z930 Tracheostomy status: Secondary | ICD-10-CM | POA: Insufficient documentation

## 2020-09-08 DIAGNOSIS — G893 Neoplasm related pain (acute) (chronic): Secondary | ICD-10-CM

## 2020-09-08 DIAGNOSIS — Z79899 Other long term (current) drug therapy: Secondary | ICD-10-CM | POA: Insufficient documentation

## 2020-09-08 DIAGNOSIS — Z95828 Presence of other vascular implants and grafts: Secondary | ICD-10-CM

## 2020-09-08 DIAGNOSIS — C329 Malignant neoplasm of larynx, unspecified: Secondary | ICD-10-CM

## 2020-09-08 DIAGNOSIS — Z9221 Personal history of antineoplastic chemotherapy: Secondary | ICD-10-CM | POA: Diagnosis not present

## 2020-09-08 DIAGNOSIS — C321 Malignant neoplasm of supraglottis: Secondary | ICD-10-CM | POA: Insufficient documentation

## 2020-09-08 DIAGNOSIS — Z87891 Personal history of nicotine dependence: Secondary | ICD-10-CM | POA: Insufficient documentation

## 2020-09-08 DIAGNOSIS — E039 Hypothyroidism, unspecified: Secondary | ICD-10-CM

## 2020-09-08 DIAGNOSIS — Z923 Personal history of irradiation: Secondary | ICD-10-CM | POA: Diagnosis not present

## 2020-09-08 DIAGNOSIS — Z5112 Encounter for antineoplastic immunotherapy: Secondary | ICD-10-CM | POA: Diagnosis present

## 2020-09-08 LAB — TSH: TSH: 1.545 u[IU]/mL (ref 0.308–3.960)

## 2020-09-08 LAB — CBC WITH DIFFERENTIAL/PLATELET
Abs Immature Granulocytes: 0.01 10*3/uL (ref 0.00–0.07)
Basophils Absolute: 0 10*3/uL (ref 0.0–0.1)
Basophils Relative: 0 %
Eosinophils Absolute: 0.2 10*3/uL (ref 0.0–0.5)
Eosinophils Relative: 5 %
HCT: 39.7 % (ref 36.0–46.0)
Hemoglobin: 13.3 g/dL (ref 12.0–15.0)
Immature Granulocytes: 0 %
Lymphocytes Relative: 26 %
Lymphs Abs: 1.3 10*3/uL (ref 0.7–4.0)
MCH: 30 pg (ref 26.0–34.0)
MCHC: 33.5 g/dL (ref 30.0–36.0)
MCV: 89.4 fL (ref 80.0–100.0)
Monocytes Absolute: 0.5 10*3/uL (ref 0.1–1.0)
Monocytes Relative: 11 %
Neutro Abs: 2.8 10*3/uL (ref 1.7–7.7)
Neutrophils Relative %: 58 %
Platelets: 233 10*3/uL (ref 150–400)
RBC: 4.44 MIL/uL (ref 3.87–5.11)
RDW: 13 % (ref 11.5–15.5)
WBC: 4.8 10*3/uL (ref 4.0–10.5)
nRBC: 0 % (ref 0.0–0.2)

## 2020-09-08 LAB — COMPREHENSIVE METABOLIC PANEL
ALT: 15 U/L (ref 0–44)
AST: 17 U/L (ref 15–41)
Albumin: 3.3 g/dL — ABNORMAL LOW (ref 3.5–5.0)
Alkaline Phosphatase: 63 U/L (ref 38–126)
Anion gap: 8 (ref 5–15)
BUN: 22 mg/dL (ref 8–23)
CO2: 23 mmol/L (ref 22–32)
Calcium: 7.7 mg/dL — ABNORMAL LOW (ref 8.9–10.3)
Chloride: 109 mmol/L (ref 98–111)
Creatinine, Ser: 0.67 mg/dL (ref 0.44–1.00)
GFR, Estimated: >60 mL/min (ref >60)
Glucose, Bld: 90 mg/dL (ref 70–99)
Potassium: 3.7 mmol/L (ref 3.5–5.1)
Sodium: 140 mmol/L (ref 135–145)
Total Bilirubin: 0.3 mg/dL (ref 0.3–1.2)
Total Protein: 6.3 g/dL — ABNORMAL LOW (ref 6.5–8.1)

## 2020-09-08 LAB — T4, FREE: Free T4: 1.08 ng/dL (ref 0.61–1.12)

## 2020-09-08 MED ORDER — SODIUM CHLORIDE 0.9% FLUSH
10.0000 mL | Freq: Once | INTRAVENOUS | Status: AC
  Administered 2020-09-08: 10 mL
  Filled 2020-09-08: qty 10

## 2020-09-08 MED ORDER — HEPARIN SOD (PORK) LOCK FLUSH 100 UNIT/ML IV SOLN
500.0000 [IU] | Freq: Once | INTRAVENOUS | Status: AC | PRN
  Administered 2020-09-08: 500 [IU]
  Filled 2020-09-08: qty 5

## 2020-09-08 MED ORDER — SODIUM CHLORIDE 0.9% FLUSH
10.0000 mL | INTRAVENOUS | Status: DC | PRN
Start: 2020-09-08 — End: 2020-09-08
  Administered 2020-09-08: 10 mL
  Filled 2020-09-08: qty 10

## 2020-09-08 MED ORDER — HYDROCODONE-ACETAMINOPHEN 5-325 MG PO TABS: 1.0000 | ORAL_TABLET | Freq: Four times a day (QID) | ORAL | 0 refills | Status: DC | PRN

## 2020-09-08 MED ORDER — SODIUM CHLORIDE 0.9 % IV SOLN
200.0000 mg | Freq: Once | INTRAVENOUS | Status: AC
  Administered 2020-09-08: 200 mg via INTRAVENOUS
  Filled 2020-09-08: qty 8

## 2020-09-08 MED ORDER — SODIUM CHLORIDE 0.9 % IV SOLN
Freq: Once | INTRAVENOUS | Status: AC
Start: 2020-09-08 — End: 2020-09-08
  Filled 2020-09-08: qty 250

## 2020-09-08 NOTE — Assessment & Plan Note (Addendum)
Her pain is well controlled We discussed narcotic refill policy  I refilled her medications today

## 2020-09-08 NOTE — Assessment & Plan Note (Signed)
I have reviewed multiple imaging studies with the patient  CT imaging show improved disease control We will continue immunotherapy indefinitely I plan to repeat imaging study again in about 4 months, due next around April 2022

## 2020-09-08 NOTE — Assessment & Plan Note (Signed)
She had extreme poor dentition that was not taken care of prior to radiation treatment I agree for her to proceed with dental extraction in February as scheduled

## 2020-09-08 NOTE — Progress Notes (Signed)
[place] OFFICE PROGRESS NOTE  Patient Care Team: Hamrick, Lorin Mercy, MD as PCP - General (Family Medicine)  ASSESSMENT & PLAN:  Laryngeal squamous cell carcinoma ([place]) I have reviewed multiple imaging studies with the patient  CT imaging show improved disease control We will continue immunotherapy indefinitely I plan to repeat imaging study again in about 4 months, due next around April 2022  Poor dentition She had extreme poor dentition that was not taken care of prior to radiation treatment I agree for her to proceed with dental extraction in February as scheduled  Cancer associated pain Her pain is well controlled We discussed narcotic refill policy  I refilled her medications today    Orders Placed This Encounter  Procedures  . TSH    Standing Status:   Standing    Number of Occurrences:   22    Standing Expiration Date:   09/08/2021    All questions were answered. The patient knows to call the clinic with any problems, questions or concerns. The total time spent in the appointment was 20 minutes encounter with patients including review of chart and various tests results, discussions about plan of care and coordination of care plan   Heath Lark, MD 09/08/2020 12:06 PM  INTERVAL HISTORY: Please see below for problem oriented charting. She returns with her partner, Cyndy to review test results She is doing well She is pleased that her dental surgeon is able to schedule dental extraction next year Her pain is well controlled No recent infusion reactions Appetite is fair No recent mouth sores or oral thrush  SUMMARY OF ONCOLOGIC HISTORY: Oncology History  Laryngeal squamous cell carcinoma ([place])  12/02/2018 Imaging   CT neck: IMPRESSION: 1. Supraglottic laryngeal carcinoma extending across the anterior commissure and invading the prelaryngeal fat, left aryepiglottic fold and left aspect of the epiglottis. 2. Severe narrowing of the laryngeal  airway. 3. Bilateral subcentimeter level 2A and 2A cervical lymph nodes.     12/02/2018 Imaging   CT  Chest:  IMPRESSION: No evidence of metastatic disease in the chest.   12/03/2018 Pathology Results   (Care Everywhere; [facility]) A: Larynx, supraglottis, biopsy - Atypical squamous proliferation with at least low grade dysplasia and abundant keratinization - No definite high grade dysplasia or invasive carcinoma represented (see comment)   12/18/2018 Pathology Results   (Care Everywhere; [facility]) A:  Neck, left, fine needle aspiration - Rare single atypical cells, no cells diagnostic of malignancy - Lymph node material present  B:  Neck, right, fine needle aspiration - Non-diagnostic specimen - No lymph node material identified - Mixed inflammation and blood   12/31/2018 Pathology Results   (Care Everywhere; [facility])  A: Larynx, left supraglottic mass, biopsy - Atypical squamous proliferation with abundant keratinization, suspicious for well-differentiated invasive squamous cell carcinoma  B: Larynx, left supraglottic mass, biopsy - Well-differentiated invasive squamous cell carcinoma with abundant keratinization (see comment)   01/18/2019 Initial Diagnosis   Laryngeal cancer ([place])   01/26/2019 Imaging   1. Locally advanced hypermetabolic laryngeal neoplasm centered in the left supraglottic space with bilateral glottic and epiglottic involvement and subglottic extension on the left. 2. Hypermetabolic bilateral level 3 neck nodal metastases. 3. Hypermetabolic high mediastinal nodal metastases between the trachea and upper thoracic esophagus. 4. Otherwise no distant hypermetabolic metastatic disease. 5. Nodular 7 mm focus at the left lung base, below PET resolution, recommend attention on follow-up chest CT in 3 months. 6. Chronic findings include: Aortic Atherosclerosis (ICD10-I70.0). Cholelithiasis.  02/08/2019 Cancer Staging   Staging form: Larynx - Supraglottis,  AJCC 8th Edition - Clinical stage from 02/08/2019: Stage IVA (cT3, cN2c, cM0) - Signed by Eppie Gibson, MD on 02/08/2019   02/11/2019 - 04/07/2019 Chemotherapy   The patient weekly cisplatin for chemotherapy treatment.     07/20/2019 Pathology Results   PROCEDURE: Total glossectomy, total pharyngectomy, total laryngectomy, total thyroidectomy, and cervical esophagectomy TUMOR SITE: Larynx TUMOR LATERALITY: Not specified TUMOR FOCALITY: Unifocal TUMOR SIZE:    GREATEST DIMENSION: 6.9 cm    ADDITIONAL DIMENSIONS: 5.4 x 2.4 cm HISTOLOGIC TYPE: Squamous cell carcinoma, conventional (keratinizing) HISTOLOGIC GRADE:  G1-G2: Well to moderately differentiated TUMOR EXTENSION: Tumor involves the posterior tongue, pharynx, larynx, tracheal cartilage, hyoid bone, cervical esophagus, vagus nerve, carotid artery, deep cervical fascia, ans surrounding skeletal muscle and soft tissue MARGINS: Uninvolved by invasive tumor; Perineural invasion is less than 33mm from the left soft tissue margin LYMPHOVASCULAR INVASION: Not identified PERINEURAL INVASION:  Present REGIONAL LYMPH NODES:     NUMBER OF LYMPH NODES INVOLVED: 2     NUMBER OF LYMPH NODES EXAMINED: 24     LATERALITY OF LYMPH NODES INVOLVED: Cannot determine     SIZE OF LARGEST METASTATIC DEPOSIT: 0.8 cm     EXTRANODAL EXTENSION: Not identified PATHOLOGIC STAGE CLASSIFICATION (pTNM, AJCC 8TH Ed): (r)(y) pT4b pN2a DISTANT METASTASIS (pM): ADDITIONAL PATHOLOGIC FINDINGS: Previous chemoradiation   11/04/2019 Imaging   PET: 1.  Hypermetabolic bilateral neck lesions as detailed above are concerning for disease recurrence. 2.  Compared to 07/08/2019 PET scan, interval development of subcentimeter bilateral upper lobe pulmonary nodules, size below PET resolution. These are concerning for metastasis. Consider diagnostic quality CT chest exam for further evaluation as per oncology treatment planning. 3.  Focal hypermetabolic uptake in right  ventricular cavity of indeterminate significance. Recommend echocardiographic examination to rule out underlying thrombus. This finding was discussed with Dr. Conley Canal by Dr. Clementeen Graham and Dr. Marcello Moores on 11/04/2019 at approximately 3:00 PM via telephone. 4.  Ancillary CT findings as above.   11/05/2019 Imaging   Echocardiogram: Summary   1. The left ventricle is normal in size with normal wall thickness.   2. The left ventricular systolic function is normal, LVEF is visually estimated at 60-65%.   3. The right ventricle is normal in size, with normal systolic function.   4. There is an extrinsic compression of the RV and RA laterally at the AV groove by a mass of 4.3 cm diameter. Unclear if the mass is invasive, although no pericardial effusion seen suggestive no invasion possibly. There is no evidence of hemodynamic compromise secondary to cardiac compression with normal IVC diameter.   12/06/2019 Pathology Results   A. LYMPH NODE, LEFT SUBMANDIBULAR, BIOPSY:  - Squamous cell carcinoma.  - No distinct nodal tissue identified.    12/06/2019 Procedure   Successful ultrasound-guided core biopsy of centrally necrotic left submandibular lymph node. Of note, due to extensive prior postsurgical and post radiation changes, patient has very limited mobility in the neck and the biopsy was extremely challenging    12/16/2019 -  Chemotherapy   The patient had pembrolizumab for chemotherapy treatment.     03/08/2020 PET scan   1. Diminished size of masses in the neck as described. Persistent increased metabolic activity particularly in the LEFT neck. Some of this diminished size on the LEFT could be due to decompression of necrotic material. Correlate with any symptoms of inflammation or infection in this area as well given the paucity of residual soft tissue  associated with the above abnormality, overall findings are compatible with residual disease in this location and there is no significant surrounding  stranding to indicate inflammation that would correlate with the degree of FDG uptake remaining. 2. Diminished activity in the juxta cardiac mass with peripheral activity suggesting interval necrosis, on the prior study this was uniformly hypermetabolic. There is still considerable FDG uptake. 3. No signs of disease in the abdomen or pelvis. 4. 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 Correlate with any new pain or symptoms in this area with further imaging as warranted. No discrete mass or bone destruction on today's study.   06/08/2020 Imaging   CT neck 1. Status post laryngectomy and neck dissection without lymphadenopathy or mass lesion. 2. Short segment severe stenosis of the left internal carotid artery adjacent to a surgical clip.     06/08/2020 Imaging   CT chest 1. There is a large, hypodense lesion within, or very closely abutting and likely intruding upon the right ventricle, as seen on prior examination and approximately 4.6 x 4.3 cm, poorly evaluated due to cardiac motion. Findings are consistent with metastatic lesion and/or thrombus and poorly assessed by ungated CT. Consider echocardiography and cardiac MRI for further assessment.   2. Multiple small pulmonary nodules in the bilateral lung apices are unchanged. Attention on follow-up.   3.  No evidence of new metastatic disease in the chest.   4.  Aortic Atherosclerosis (ICD10-I70.0).     09/07/2020 Imaging   1. Mild decrease in size of juxta cardiac metastatic involving the right ventricle. 2. Small pulmonary nodules are unchanged from previous exam. 3. Aortic atherosclerosis. 4. Gallstone.       REVIEW OF SYSTEMS:   Constitutional: Denies fevers, chills or abnormal weight loss Eyes: Denies blurriness of vision Ears, nose, mouth, throat, and face: Denies mucositis or sore throat Respiratory: Denies cough, dyspnea or wheezes Cardiovascular: Denies palpitation, chest discomfort or lower extremity  swelling Gastrointestinal:  Denies nausea, heartburn or change in bowel habits Skin: Denies abnormal skin rashes Lymphatics: Denies new lymphadenopathy or easy bruising Neurological:Denies numbness, tingling or new weaknesses Behavioral/Psych: Mood is stable, no new changes  All other systems were reviewed with the patient and are negative.  I have reviewed the past medical history, past surgical history, social history and family history with the patient and they are unchanged from previous note.  ALLERGIES:  has No Known Allergies.  MEDICATIONS:  Current Outpatient Medications  Medication Sig Dispense Refill  . chlorhexidine (PERIDEX) 0.12 % solution Rinse with 15 mls twice daily for 30 seconds. Use after breakfast and at bedtime. Spit out excess. Do not swallow. 480 mL 3  . HYDROcodone-acetaminophen (NORCO) 5-325 MG tablet Take 1 tablet by mouth every 6 (six) hours as needed for moderate pain. 60 tablet 0  . levothyroxine (SYNTHROID) 137 MCG tablet Take 137 mcg by mouth daily before breakfast.    . lidocaine-prilocaine (EMLA) cream Apply to affected area once 30 g 3  . morphine (MS CONTIN) 15 MG 12 hr tablet TAKE 1 TABLET BY MOUTH EVERYDAY AT BEDTIME    . Nutritional Supplements (KATE FARMS PEPTIDE 1.5) LIQD Take 4 Bottles by mouth daily. 325 mL 11  . ondansetron (ZOFRAN) 8 MG tablet Take 1 tablet (8 mg total) by mouth 2 (two) times daily as needed (Nausea or vomiting). 30 tablet 1  . prochlorperazine (COMPAZINE) 10 MG tablet Take 1 tablet (10 mg total) by mouth every 6 (six) hours as needed (Nausea or vomiting). 30 tablet 1  .  sodium fluoride (PREVIDENT 5000 PLUS) 1.1 % CREA dental cream Apply cream to tooth brush. Brush teeth for 2 minutes. Spit out excess. DO NOT rinse afterwards. Repeat nightly. (Patient taking differently: Place 1 application onto teeth 2 (two) times a day. ) 1 Tube prn   No current facility-administered medications for this visit.   Facility-Administered  Medications Ordered in Other Visits  Medication Dose Route Frequency Provider Last Rate Last Admin  . heparin lock flush 100 unit/mL  500 Units Intracatheter Once PRN Alvy Bimler, Brettany Sydney, MD      . pembrolizumab (KEYTRUDA) 200 mg in sodium chloride 0.9 % 50 mL chemo infusion  200 mg Intravenous Once Jaxin Fulfer, MD      . sodium chloride flush (NS) 0.9 % injection 10 mL  10 mL Intracatheter PRN Alvy Bimler, Alinah Sheard, MD        PHYSICAL EXAMINATION: ECOG PERFORMANCE STATUS: 1 - Symptomatic but completely ambulatory  Vitals:   09/08/20 1108  BP: 109/76  Pulse: 75  Resp: 18  Temp: 97.7 F (36.5 C)  SpO2: 99%   Filed Weights   09/08/20 1108  Weight: 137 lb 6.4 oz (62.3 kg)    GENERAL:alert, no distress and comfortable SKIN: skin color, texture, turgor are normal, no rashes or significant lesions EYES: normal, Conjunctiva are pink and non-injected, sclera clear OROPHARYNX: Poor dentition is noted, no thrush   NECK: Tracheostomy site looks okay LABORATORY DATA:  I have reviewed the data as listed    Component Value Date/Time   NA 140 09/08/2020 1044   K 3.7 09/08/2020 1044   CL 109 09/08/2020 1044   CO2 23 09/08/2020 1044   GLUCOSE 90 09/08/2020 1044   BUN 22 09/08/2020 1044   CREATININE 0.67 09/08/2020 1044   CREATININE 0.60 02/17/2020 1128   CALCIUM 7.7 (L) 09/08/2020 1044   PROT 6.3 (L) 09/08/2020 1044   ALBUMIN 3.3 (L) 09/08/2020 1044   AST 17 09/08/2020 1044   AST 22 02/17/2020 1128   ALT 15 09/08/2020 1044   ALT 24 02/17/2020 1128   ALKPHOS 63 09/08/2020 1044   BILITOT 0.3 09/08/2020 1044   BILITOT 0.5 02/17/2020 1128   GFRNONAA >60 09/08/2020 1044   GFRNONAA >60 02/17/2020 1128   GFRAA >60 06/08/2020 1303   GFRAA >60 02/17/2020 1128    No results found for: SPEP, UPEP  Lab Results  Component Value Date   WBC 4.8 09/08/2020   NEUTROABS 2.8 09/08/2020   HGB 13.3 09/08/2020   HCT 39.7 09/08/2020   MCV 89.4 09/08/2020   PLT 233 09/08/2020      Chemistry       Component Value Date/Time   NA 140 09/08/2020 1044   K 3.7 09/08/2020 1044   CL 109 09/08/2020 1044   CO2 23 09/08/2020 1044   BUN 22 09/08/2020 1044   CREATININE 0.67 09/08/2020 1044   CREATININE 0.60 02/17/2020 1128      Component Value Date/Time   CALCIUM 7.7 (L) 09/08/2020 1044   ALKPHOS 63 09/08/2020 1044   AST 17 09/08/2020 1044   AST 22 02/17/2020 1128   ALT 15 09/08/2020 1044   ALT 24 02/17/2020 1128   BILITOT 0.3 09/08/2020 1044   BILITOT 0.5 02/17/2020 1128       RADIOGRAPHIC STUDIES: I have personally reviewed the radiological images as listed and agreed with the findings in the report. CT Chest W Contrast  Result Date: 09/08/2020 CLINICAL DATA:  Restaging laryngeal carcinoma. EXAM: CT CHEST WITH CONTRAST TECHNIQUE: Multidetector CT imaging  of the chest was performed during intravenous contrast administration. CONTRAST:  32mL OMNIPAQUE IOHEXOL 300 MG/ML  SOLN COMPARISON:  06/08/2020 FINDINGS: Cardiovascular: The heart size appears within normal limits. No pericardial effusion. Juxta cardiac mass with intrusion upon the right ventricle is again noted. This measures 4.8 x 3.8 x 4.3 cm (volume = 41 cm^3), image 93/2. Previously this measured 5.5 by 4.4 by 4.4 cm (volume = 56 cm^3). Tumor/bland thrombus is again identified extending into the right ventricle and right ventricular outflow tract, image 36/6. Aortic atherosclerosis. Mediastinum/Nodes: Tracheostomy tube tip is above the carina. Normal appearance of the esophagus. The thyroid gland appears surgically absent. No axillary, supraclavicular, mediastinal, or hilar adenopathy. Lungs/Pleura: No pleural effusion, airspace consolidation or atelectasis. Several small pulmonary nodules are again noted. Index nodule within the anterior left apex measures 6 mm, image 13/5. Unchanged. 5 mm right upper lobe lung nodule is unchanged, image 18/3. Unchanged lateral right upper lobe lung nodule measures 4 mm, image 19/5. Upper Abdomen: No  acute findings. Large gallstone is identified measuring 1.9 cm. Gastrostomy tube is identified Musculoskeletal: Multilevel degenerative disc disease identified within the thoracic spine. There is no acute or suspicious osseous lesions IMPRESSION: 1. Mild decrease in size of juxta cardiac metastatic involving the right ventricle. 2. Small pulmonary nodules are unchanged from previous exam. 3. Aortic atherosclerosis. 4. Gallstone. Aortic Atherosclerosis (ICD10-I70.0). Electronically Signed   By: Kerby Moors M.D.   On: 09/08/2020 08:00

## 2020-09-08 NOTE — Patient Instructions (Signed)
[facility] Discharge Instructions for Patients Receiving Chemotherapy  Today you received the following chemotherapy agents Keytruda  To help prevent nausea and vomiting after your treatment, we encourage you to take your nausea medication as directed.    If you develop nausea and vomiting that is not controlled by your nausea medication, call the clinic.   BELOW ARE SYMPTOMS THAT SHOULD BE REPORTED IMMEDIATELY:  *FEVER GREATER THAN 100.5 F  *CHILLS WITH OR WITHOUT FEVER  NAUSEA AND VOMITING THAT IS NOT CONTROLLED WITH YOUR NAUSEA MEDICATION  *UNUSUAL SHORTNESS OF BREATH  *UNUSUAL BRUISING OR BLEEDING  TENDERNESS IN MOUTH AND THROAT WITH OR WITHOUT PRESENCE OF ULCERS  *URINARY PROBLEMS  *BOWEL PROBLEMS  UNUSUAL RASH Items with * indicate a potential emergency and should be followed up as soon as possible.  Feel free to call the clinic should you have any questions or concerns. The clinic phone number is [phone].  Please show the [place] at check-in to the Emergency Department and triage nurse.

## 2020-09-12 DIAGNOSIS — C321 Malignant neoplasm of supraglottis: Secondary | ICD-10-CM | POA: Diagnosis not present

## 2020-09-12 DIAGNOSIS — R633 Feeding difficulties, unspecified: Secondary | ICD-10-CM | POA: Diagnosis not present

## 2020-09-19 DIAGNOSIS — Z6825 Body mass index (BMI) 25.0-25.9, adult: Secondary | ICD-10-CM | POA: Diagnosis not present

## 2020-09-19 DIAGNOSIS — E781 Pure hyperglyceridemia: Secondary | ICD-10-CM | POA: Diagnosis not present

## 2020-09-19 DIAGNOSIS — E039 Hypothyroidism, unspecified: Secondary | ICD-10-CM | POA: Diagnosis not present

## 2020-09-19 DIAGNOSIS — R21 Rash and other nonspecific skin eruption: Secondary | ICD-10-CM | POA: Diagnosis not present

## 2020-09-23 DIAGNOSIS — R633 Feeding difficulties, unspecified: Secondary | ICD-10-CM | POA: Diagnosis not present

## 2020-09-23 DIAGNOSIS — C321 Malignant neoplasm of supraglottis: Secondary | ICD-10-CM | POA: Diagnosis not present

## 2020-09-29 ENCOUNTER — Inpatient Hospital Stay: Payer: Medicare HMO

## 2020-09-29 ENCOUNTER — Other Ambulatory Visit: Payer: Self-pay

## 2020-09-29 ENCOUNTER — Encounter: Payer: Self-pay | Admitting: Hematology and Oncology

## 2020-09-29 ENCOUNTER — Inpatient Hospital Stay (HOSPITAL_BASED_OUTPATIENT_CLINIC_OR_DEPARTMENT_OTHER): Payer: Medicare HMO | Admitting: Hematology and Oncology

## 2020-09-29 ENCOUNTER — Inpatient Hospital Stay: Payer: Medicare HMO | Attending: Hematology

## 2020-09-29 DIAGNOSIS — K089 Disorder of teeth and supporting structures, unspecified: Secondary | ICD-10-CM | POA: Diagnosis not present

## 2020-09-29 DIAGNOSIS — C329 Malignant neoplasm of larynx, unspecified: Secondary | ICD-10-CM

## 2020-09-29 DIAGNOSIS — E039 Hypothyroidism, unspecified: Secondary | ICD-10-CM | POA: Diagnosis not present

## 2020-09-29 DIAGNOSIS — Z930 Tracheostomy status: Secondary | ICD-10-CM | POA: Diagnosis not present

## 2020-09-29 DIAGNOSIS — Z87891 Personal history of nicotine dependence: Secondary | ICD-10-CM | POA: Diagnosis not present

## 2020-09-29 DIAGNOSIS — Z79899 Other long term (current) drug therapy: Secondary | ICD-10-CM | POA: Insufficient documentation

## 2020-09-29 DIAGNOSIS — G893 Neoplasm related pain (acute) (chronic): Secondary | ICD-10-CM

## 2020-09-29 DIAGNOSIS — C321 Malignant neoplasm of supraglottis: Secondary | ICD-10-CM | POA: Diagnosis present

## 2020-09-29 DIAGNOSIS — Z5112 Encounter for antineoplastic immunotherapy: Secondary | ICD-10-CM | POA: Insufficient documentation

## 2020-09-29 DIAGNOSIS — Z923 Personal history of irradiation: Secondary | ICD-10-CM | POA: Insufficient documentation

## 2020-09-29 DIAGNOSIS — Z95828 Presence of other vascular implants and grafts: Secondary | ICD-10-CM

## 2020-09-29 LAB — CBC WITH DIFFERENTIAL/PLATELET
Abs Immature Granulocytes: 0.02 10*3/uL (ref 0.00–0.07)
Basophils Absolute: 0 10*3/uL (ref 0.0–0.1)
Basophils Relative: 0 %
Eosinophils Absolute: 0.2 10*3/uL (ref 0.0–0.5)
Eosinophils Relative: 4 %
HCT: 39.8 % (ref 36.0–46.0)
Hemoglobin: 13.2 g/dL (ref 12.0–15.0)
Immature Granulocytes: 0 %
Lymphocytes Relative: 22 %
Lymphs Abs: 1.1 10*3/uL (ref 0.7–4.0)
MCH: 29.5 pg (ref 26.0–34.0)
MCHC: 33.2 g/dL (ref 30.0–36.0)
MCV: 89 fL (ref 80.0–100.0)
Monocytes Absolute: 0.6 10*3/uL (ref 0.1–1.0)
Monocytes Relative: 12 %
Neutro Abs: 3 10*3/uL (ref 1.7–7.7)
Neutrophils Relative %: 62 %
Platelets: 223 10*3/uL (ref 150–400)
RBC: 4.47 MIL/uL (ref 3.87–5.11)
RDW: 13.1 % (ref 11.5–15.5)
WBC: 5 10*3/uL (ref 4.0–10.5)
nRBC: 0 % (ref 0.0–0.2)

## 2020-09-29 LAB — COMPREHENSIVE METABOLIC PANEL
ALT: 18 U/L (ref 0–44)
AST: 20 U/L (ref 15–41)
Albumin: 3.9 g/dL (ref 3.5–5.0)
Alkaline Phosphatase: 71 U/L (ref 38–126)
Anion gap: 8 (ref 5–15)
BUN: 21 mg/dL (ref 8–23)
CO2: 25 mmol/L (ref 22–32)
Calcium: 8.9 mg/dL (ref 8.9–10.3)
Chloride: 103 mmol/L (ref 98–111)
Creatinine, Ser: 0.78 mg/dL (ref 0.44–1.00)
GFR, Estimated: >60 mL/min (ref >60)
Glucose, Bld: 97 mg/dL (ref 70–99)
Potassium: 4.3 mmol/L (ref 3.5–5.1)
Sodium: 136 mmol/L (ref 135–145)
Total Bilirubin: 0.3 mg/dL (ref 0.3–1.2)
Total Protein: 7.3 g/dL (ref 6.5–8.1)

## 2020-09-29 LAB — TSH: TSH: 2.742 u[IU]/mL (ref 0.308–3.960)

## 2020-09-29 MED ORDER — SODIUM CHLORIDE 0.9% FLUSH
10.0000 mL | Freq: Once | INTRAVENOUS | Status: AC
  Administered 2020-09-29: 10 mL
  Filled 2020-09-29: qty 10

## 2020-09-29 MED ORDER — SODIUM CHLORIDE 0.9 % IV SOLN
Freq: Once | INTRAVENOUS | Status: AC
  Filled 2020-09-29: qty 250

## 2020-09-29 MED ORDER — HEPARIN SOD (PORK) LOCK FLUSH 100 UNIT/ML IV SOLN
500.0000 [IU] | Freq: Once | INTRAVENOUS | Status: AC | PRN
  Administered 2020-09-29: 500 [IU]
  Filled 2020-09-29: qty 5

## 2020-09-29 MED ORDER — SODIUM CHLORIDE 0.9 % IV SOLN
200.0000 mg | Freq: Once | INTRAVENOUS | Status: AC
  Administered 2020-09-29: 200 mg via INTRAVENOUS
  Filled 2020-09-29: qty 8

## 2020-09-29 MED ORDER — SODIUM CHLORIDE 0.9% FLUSH
10.0000 mL | INTRAVENOUS | Status: DC | PRN
  Administered 2020-09-29: 10 mL
  Filled 2020-09-29: qty 10

## 2020-09-29 NOTE — Assessment & Plan Note (Signed)
She had extreme poor dentition that was not taken care of prior to radiation treatment I agree for her to proceed with dental extraction in February as scheduled

## 2020-09-29 NOTE — Assessment & Plan Note (Signed)
Her pain is well controlled We discussed narcotic refill policy

## 2020-09-29 NOTE — Progress Notes (Signed)
[place] OFFICE PROGRESS NOTE  Patient Care Team: Hamrick, Lorin Mercy, MD as PCP - General (Family Medicine)  ASSESSMENT & PLAN:  Laryngeal squamous cell carcinoma ([place]) Her last CT imaging show improved disease control We will continue immunotherapy indefinitely I plan to repeat imaging study again in a few months, due next around April 2022  Hypothyroidism This is likely due to prior radiation exposure We will continue to check her thyroid level and adjust medicine as needed  Poor dentition She had extreme poor dentition that was not taken care of prior to radiation treatment I agree for her to proceed with dental extraction in February as scheduled  Cancer associated pain Her pain is well controlled We discussed narcotic refill policy    No orders of the defined types were placed in this encounter.   All questions were answered. The patient knows to call the clinic with any problems, questions or concerns. The total time spent in the appointment was 20 minutes encounter with patients including review of chart and various tests results, discussions about plan of care and coordination of care plan   Heath Lark, MD 09/29/2020 1:16 PM  INTERVAL HISTORY: Please see below for problem oriented charting. She returns with her son for further follow-up She is doing well Pain control is stable No recent recurrence of thrush She tolerated feeding tube well  SUMMARY OF ONCOLOGIC HISTORY: Oncology History  Laryngeal squamous cell carcinoma ([place])  12/02/2018 Imaging   CT neck: IMPRESSION: 1. Supraglottic laryngeal carcinoma extending across the anterior commissure and invading the prelaryngeal fat, left aryepiglottic fold and left aspect of the epiglottis. 2. Severe narrowing of the laryngeal airway. 3. Bilateral subcentimeter level 2A and 2A cervical lymph nodes.     12/02/2018 Imaging   CT  Chest:  IMPRESSION: No evidence of metastatic disease in the chest.    12/03/2018 Pathology Results   (Care Everywhere; [facility]) A: Larynx, supraglottis, biopsy - Atypical squamous proliferation with at least low grade dysplasia and abundant keratinization - No definite high grade dysplasia or invasive carcinoma represented (see comment)   12/18/2018 Pathology Results   (Care Everywhere; [facility]) A:  Neck, left, fine needle aspiration - Rare single atypical cells, no cells diagnostic of malignancy - Lymph node material present  B:  Neck, right, fine needle aspiration - Non-diagnostic specimen - No lymph node material identified - Mixed inflammation and blood   12/31/2018 Pathology Results   (Care Everywhere; [facility])  A: Larynx, left supraglottic mass, biopsy - Atypical squamous proliferation with abundant keratinization, suspicious for well-differentiated invasive squamous cell carcinoma  B: Larynx, left supraglottic mass, biopsy - Well-differentiated invasive squamous cell carcinoma with abundant keratinization (see comment)   01/18/2019 Initial Diagnosis   Laryngeal cancer ([place])   01/26/2019 Imaging   1. Locally advanced hypermetabolic laryngeal neoplasm centered in the left supraglottic space with bilateral glottic and epiglottic involvement and subglottic extension on the left. 2. Hypermetabolic bilateral level 3 neck nodal metastases. 3. Hypermetabolic high mediastinal nodal metastases between the trachea and upper thoracic esophagus. 4. Otherwise no distant hypermetabolic metastatic disease. 5. Nodular 7 mm focus at the left lung base, below PET resolution, recommend attention on follow-up chest CT in 3 months. 6. Chronic findings include: Aortic Atherosclerosis (ICD10-I70.0). Cholelithiasis.   02/08/2019 Cancer Staging   Staging form: Larynx - Supraglottis, AJCC 8th Edition - Clinical stage from 02/08/2019: Stage IVA (cT3, cN2c, cM0) - Signed by Eppie Gibson, MD on 02/08/2019   02/11/2019 - 04/07/2019  Chemotherapy   The patient  weekly cisplatin for chemotherapy treatment.     07/20/2019 Pathology Results   PROCEDURE: Total glossectomy, total pharyngectomy, total laryngectomy, total thyroidectomy, and cervical esophagectomy TUMOR SITE: Larynx TUMOR LATERALITY: Not specified TUMOR FOCALITY: Unifocal TUMOR SIZE:    GREATEST DIMENSION: 6.9 cm    ADDITIONAL DIMENSIONS: 5.4 x 2.4 cm HISTOLOGIC TYPE: Squamous cell carcinoma, conventional (keratinizing) HISTOLOGIC GRADE:  G1-G2: Well to moderately differentiated TUMOR EXTENSION: Tumor involves the posterior tongue, pharynx, larynx, tracheal cartilage, hyoid bone, cervical esophagus, vagus nerve, carotid artery, deep cervical fascia, ans surrounding skeletal muscle and soft tissue MARGINS: Uninvolved by invasive tumor; Perineural invasion is less than 11mm from the left soft tissue margin LYMPHOVASCULAR INVASION: Not identified PERINEURAL INVASION:  Present REGIONAL LYMPH NODES:     NUMBER OF LYMPH NODES INVOLVED: 2     NUMBER OF LYMPH NODES EXAMINED: 24     LATERALITY OF LYMPH NODES INVOLVED: Cannot determine     SIZE OF LARGEST METASTATIC DEPOSIT: 0.8 cm     EXTRANODAL EXTENSION: Not identified PATHOLOGIC STAGE CLASSIFICATION (pTNM, AJCC 8TH Ed): (r)(y) pT4b pN2a DISTANT METASTASIS (pM): ADDITIONAL PATHOLOGIC FINDINGS: Previous chemoradiation   11/04/2019 Imaging   PET: 1.  Hypermetabolic bilateral neck lesions as detailed above are concerning for disease recurrence. 2.  Compared to 07/08/2019 PET scan, interval development of subcentimeter bilateral upper lobe pulmonary nodules, size below PET resolution. These are concerning for metastasis. Consider diagnostic quality CT chest exam for further evaluation as per oncology treatment planning. 3.  Focal hypermetabolic uptake in right ventricular cavity of indeterminate significance. Recommend echocardiographic examination to rule out underlying thrombus. This finding was discussed with Dr. Conley Canal by Dr. Clementeen Graham  and Dr. Marcello Moores on 11/04/2019 at approximately 3:00 PM via telephone. 4.  Ancillary CT findings as above.   11/05/2019 Imaging   Echocardiogram: Summary   1. The left ventricle is normal in size with normal wall thickness.   2. The left ventricular systolic function is normal, LVEF is visually estimated at 60-65%.   3. The right ventricle is normal in size, with normal systolic function.   4. There is an extrinsic compression of the RV and RA laterally at the AV groove by a mass of 4.3 cm diameter. Unclear if the mass is invasive, although no pericardial effusion seen suggestive no invasion possibly. There is no evidence of hemodynamic compromise secondary to cardiac compression with normal IVC diameter.   12/06/2019 Pathology Results   A. LYMPH NODE, LEFT SUBMANDIBULAR, BIOPSY:  - Squamous cell carcinoma.  - No distinct nodal tissue identified.    12/06/2019 Procedure   Successful ultrasound-guided core biopsy of centrally necrotic left submandibular lymph node. Of note, due to extensive prior postsurgical and post radiation changes, patient has very limited mobility in the neck and the biopsy was extremely challenging    12/16/2019 -  Chemotherapy   The patient had pembrolizumab for chemotherapy treatment.     03/08/2020 PET scan   1. Diminished size of masses in the neck as described. Persistent increased metabolic activity particularly in the LEFT neck. Some of this diminished size on the LEFT could be due to decompression of necrotic material. Correlate with any symptoms of inflammation or infection in this area as well given the paucity of residual soft tissue associated with the above abnormality, overall findings are compatible with residual disease in this location and there is no significant surrounding stranding to indicate inflammation that would correlate with the degree of FDG uptake remaining. 2.  Diminished activity in the juxta cardiac mass with peripheral activity suggesting  interval necrosis, on the prior study this was uniformly hypermetabolic. There is still considerable FDG uptake. 3. No signs of disease in the abdomen or pelvis. 4. 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 Correlate with any new pain or symptoms in this area with further imaging as warranted. No discrete mass or bone destruction on today's study.   06/08/2020 Imaging   CT neck 1. Status post laryngectomy and neck dissection without lymphadenopathy or mass lesion. 2. Short segment severe stenosis of the left internal carotid artery adjacent to a surgical clip.     06/08/2020 Imaging   CT chest 1. There is a large, hypodense lesion within, or very closely abutting and likely intruding upon the right ventricle, as seen on prior examination and approximately 4.6 x 4.3 cm, poorly evaluated due to cardiac motion. Findings are consistent with metastatic lesion and/or thrombus and poorly assessed by ungated CT. Consider echocardiography and cardiac MRI for further assessment.   2. Multiple small pulmonary nodules in the bilateral lung apices are unchanged. Attention on follow-up.   3.  No evidence of new metastatic disease in the chest.   4.  Aortic Atherosclerosis (ICD10-I70.0).     09/07/2020 Imaging   1. Mild decrease in size of juxta cardiac metastatic involving the right ventricle. 2. Small pulmonary nodules are unchanged from previous exam. 3. Aortic atherosclerosis. 4. Gallstone.       REVIEW OF SYSTEMS:   Constitutional: Denies fevers, chills or abnormal weight loss Eyes: Denies blurriness of vision Ears, nose, mouth, throat, and face: Denies mucositis or sore throat Respiratory: Denies cough, dyspnea or wheezes Cardiovascular: Denies palpitation, chest discomfort or lower extremity swelling Gastrointestinal:  Denies nausea, heartburn or change in bowel habits Skin: Denies abnormal skin rashes Lymphatics: Denies new lymphadenopathy or easy bruising Neurological:Denies  numbness, tingling or new weaknesses Behavioral/Psych: Mood is stable, no new changes  All other systems were reviewed with the patient and are negative.  I have reviewed the past medical history, past surgical history, social history and family history with the patient and they are unchanged from previous note.  ALLERGIES:  has No Known Allergies.  MEDICATIONS:  Current Outpatient Medications  Medication Sig Dispense Refill  . chlorhexidine (PERIDEX) 0.12 % solution Rinse with 15 mls twice daily for 30 seconds. Use after breakfast and at bedtime. Spit out excess. Do not swallow. 480 mL 3  . HYDROcodone-acetaminophen (NORCO) 5-325 MG tablet Take 1 tablet by mouth every 6 (six) hours as needed for moderate pain. 60 tablet 0  . levothyroxine (SYNTHROID) 137 MCG tablet Take 137 mcg by mouth daily before breakfast.    . lidocaine-prilocaine (EMLA) cream Apply to affected area once 30 g 3  . morphine (MS CONTIN) 15 MG 12 hr tablet TAKE 1 TABLET BY MOUTH EVERYDAY AT BEDTIME    . Nutritional Supplements (KATE FARMS PEPTIDE 1.5) LIQD Take 4 Bottles by mouth daily. 325 mL 11  . ondansetron (ZOFRAN) 8 MG tablet Take 1 tablet (8 mg total) by mouth 2 (two) times daily as needed (Nausea or vomiting). 30 tablet 1  . prochlorperazine (COMPAZINE) 10 MG tablet Take 1 tablet (10 mg total) by mouth every 6 (six) hours as needed (Nausea or vomiting). 30 tablet 1  . sodium fluoride (PREVIDENT 5000 PLUS) 1.1 % CREA dental cream Apply cream to tooth brush. Brush teeth for 2 minutes. Spit out excess. DO NOT rinse afterwards. Repeat nightly. (Patient taking differently: Place 1  application onto teeth 2 (two) times a day. ) 1 Tube prn   No current facility-administered medications for this visit.   Facility-Administered Medications Ordered in Other Visits  Medication Dose Route Frequency Provider Last Rate Last Admin  . heparin lock flush 100 unit/mL  500 Units Intracatheter Once PRN Alvy Bimler, Zymere Patlan, MD      .  pembrolizumab (KEYTRUDA) 200 mg in sodium chloride 0.9 % 50 mL chemo infusion  200 mg Intravenous Once Estus Krakowski, MD      . sodium chloride flush (NS) 0.9 % injection 10 mL  10 mL Intracatheter PRN Alvy Bimler, Rashad Auld, MD        PHYSICAL EXAMINATION: ECOG PERFORMANCE STATUS: 1 - Symptomatic but completely ambulatory  Vitals:   09/29/20 1223  BP: 115/60  Pulse: 83  Resp: 18  Temp: (!) 97.3 F (36.3 C)  SpO2: 97%   Filed Weights   09/29/20 1223  Weight: 138 lb (62.6 kg)    GENERAL:alert, no distress and comfortable SKIN: skin color, texture, turgor are normal, no rashes or significant lesions EYES: normal, Conjunctiva are pink and non-injected, sclera clear OROPHARYNX:no exudate, no erythema and lips, buccal mucosa, and tongue normal .  No thrush, noted poor dentition  NECK: Tracheostomy in situ LYMPH:  no palpable lymphadenopathy in the cervical, axillary or inguinal LUNGS: clear to auscultation and percussion with normal breathing effort HEART: regular rate & rhythm and no murmurs and no lower extremity edema ABDOMEN:abdomen soft, non-tender and normal bowel sounds.  Feeding tube site looks okay Musculoskeletal:no cyanosis of digits and no clubbing    LABORATORY DATA:  I have reviewed the data as listed    Component Value Date/Time   NA 136 09/29/2020 1156   K 4.3 09/29/2020 1156   CL 103 09/29/2020 1156   CO2 25 09/29/2020 1156   GLUCOSE 97 09/29/2020 1156   BUN 21 09/29/2020 1156   CREATININE 0.78 09/29/2020 1156   CREATININE 0.60 02/17/2020 1128   CALCIUM 8.9 09/29/2020 1156   PROT 7.3 09/29/2020 1156   ALBUMIN 3.9 09/29/2020 1156   AST 20 09/29/2020 1156   AST 22 02/17/2020 1128   ALT 18 09/29/2020 1156   ALT 24 02/17/2020 1128   ALKPHOS 71 09/29/2020 1156   BILITOT 0.3 09/29/2020 1156   BILITOT 0.5 02/17/2020 1128   GFRNONAA >60 09/29/2020 1156   GFRNONAA >60 02/17/2020 1128   GFRAA >60 06/08/2020 1303   GFRAA >60 02/17/2020 1128    No results found for:  SPEP, UPEP  Lab Results  Component Value Date   WBC 5.0 09/29/2020   NEUTROABS 3.0 09/29/2020   HGB 13.2 09/29/2020   HCT 39.8 09/29/2020   MCV 89.0 09/29/2020   PLT 223 09/29/2020      Chemistry      Component Value Date/Time   NA 136 09/29/2020 1156   K 4.3 09/29/2020 1156   CL 103 09/29/2020 1156   CO2 25 09/29/2020 1156   BUN 21 09/29/2020 1156   CREATININE 0.78 09/29/2020 1156   CREATININE 0.60 02/17/2020 1128      Component Value Date/Time   CALCIUM 8.9 09/29/2020 1156   ALKPHOS 71 09/29/2020 1156   AST 20 09/29/2020 1156   AST 22 02/17/2020 1128   ALT 18 09/29/2020 1156   ALT 24 02/17/2020 1128   BILITOT 0.3 09/29/2020 1156   BILITOT 0.5 02/17/2020 1128

## 2020-09-29 NOTE — Patient Instructions (Signed)
[facility] Discharge Instructions for Patients Receiving Chemotherapy  Today you received the following chemotherapy agents:  Keytruda.  To help prevent nausea and vomiting after your treatment, we encourage you to take your nausea medication as directed.   If you develop nausea and vomiting that is not controlled by your nausea medication, call the clinic.   BELOW ARE SYMPTOMS THAT SHOULD BE REPORTED IMMEDIATELY:  *FEVER GREATER THAN 100.5 F  *CHILLS WITH OR WITHOUT FEVER  NAUSEA AND VOMITING THAT IS NOT CONTROLLED WITH YOUR NAUSEA MEDICATION  *UNUSUAL SHORTNESS OF BREATH  *UNUSUAL BRUISING OR BLEEDING  TENDERNESS IN MOUTH AND THROAT WITH OR WITHOUT PRESENCE OF ULCERS  *URINARY PROBLEMS  *BOWEL PROBLEMS  UNUSUAL RASH Items with * indicate a potential emergency and should be followed up as soon as possible.  Feel free to call the clinic should you have any questions or concerns. The clinic phone number is [phone].  Please show the CHEMO ALERT CARD at check-in to the Emergency Department and triage nurse.    

## 2020-09-29 NOTE — Assessment & Plan Note (Signed)
Her last CT imaging show improved disease control We will continue immunotherapy indefinitely I plan to repeat imaging study again in a few months, due next around April 2022

## 2020-09-29 NOTE — Assessment & Plan Note (Signed)
This is likely due to prior radiation exposure We will continue to check her thyroid level and adjust medicine as needed

## 2020-10-20 ENCOUNTER — Inpatient Hospital Stay: Payer: Medicare HMO

## 2020-10-20 ENCOUNTER — Encounter: Payer: Self-pay | Admitting: Hematology and Oncology

## 2020-10-20 ENCOUNTER — Other Ambulatory Visit: Payer: Self-pay

## 2020-10-20 ENCOUNTER — Inpatient Hospital Stay: Payer: Medicare HMO | Admitting: Hematology and Oncology

## 2020-10-20 VITALS — BP 104/62 | HR 79 | Temp 97.5°F | Resp 18 | Ht 62.0 in | Wt 141.6 lb

## 2020-10-20 DIAGNOSIS — Z95828 Presence of other vascular implants and grafts: Secondary | ICD-10-CM

## 2020-10-20 DIAGNOSIS — G893 Neoplasm related pain (acute) (chronic): Secondary | ICD-10-CM

## 2020-10-20 DIAGNOSIS — C321 Malignant neoplasm of supraglottis: Secondary | ICD-10-CM

## 2020-10-20 DIAGNOSIS — Z79899 Other long term (current) drug therapy: Secondary | ICD-10-CM | POA: Diagnosis not present

## 2020-10-20 DIAGNOSIS — K089 Disorder of teeth and supporting structures, unspecified: Secondary | ICD-10-CM | POA: Diagnosis not present

## 2020-10-20 DIAGNOSIS — E039 Hypothyroidism, unspecified: Secondary | ICD-10-CM | POA: Diagnosis not present

## 2020-10-20 DIAGNOSIS — Z930 Tracheostomy status: Secondary | ICD-10-CM | POA: Diagnosis not present

## 2020-10-20 DIAGNOSIS — C329 Malignant neoplasm of larynx, unspecified: Secondary | ICD-10-CM

## 2020-10-20 DIAGNOSIS — Z5112 Encounter for antineoplastic immunotherapy: Secondary | ICD-10-CM | POA: Diagnosis not present

## 2020-10-20 DIAGNOSIS — B370 Candidal stomatitis: Secondary | ICD-10-CM | POA: Diagnosis not present

## 2020-10-20 DIAGNOSIS — Z923 Personal history of irradiation: Secondary | ICD-10-CM | POA: Diagnosis not present

## 2020-10-20 DIAGNOSIS — Z87891 Personal history of nicotine dependence: Secondary | ICD-10-CM | POA: Diagnosis not present

## 2020-10-20 LAB — CBC WITH DIFFERENTIAL/PLATELET
Abs Immature Granulocytes: 0.03 10*3/uL (ref 0.00–0.07)
Basophils Absolute: 0 10*3/uL (ref 0.0–0.1)
Basophils Relative: 0 %
Eosinophils Absolute: 0.3 10*3/uL (ref 0.0–0.5)
Eosinophils Relative: 6 %
HCT: 40.9 % (ref 36.0–46.0)
Hemoglobin: 13.4 g/dL (ref 12.0–15.0)
Immature Granulocytes: 1 %
Lymphocytes Relative: 26 %
Lymphs Abs: 1.2 10*3/uL (ref 0.7–4.0)
MCH: 29.3 pg (ref 26.0–34.0)
MCHC: 32.8 g/dL (ref 30.0–36.0)
MCV: 89.3 fL (ref 80.0–100.0)
Monocytes Absolute: 0.6 10*3/uL (ref 0.1–1.0)
Monocytes Relative: 12 %
Neutro Abs: 2.5 10*3/uL (ref 1.7–7.7)
Neutrophils Relative %: 55 %
Platelets: 215 10*3/uL (ref 150–400)
RBC: 4.58 MIL/uL (ref 3.87–5.11)
RDW: 13.1 % (ref 11.5–15.5)
WBC: 4.6 10*3/uL (ref 4.0–10.5)
nRBC: 0 % (ref 0.0–0.2)

## 2020-10-20 LAB — COMPREHENSIVE METABOLIC PANEL
ALT: 16 U/L (ref 0–44)
AST: 19 U/L (ref 15–41)
Albumin: 3.9 g/dL (ref 3.5–5.0)
Alkaline Phosphatase: 69 U/L (ref 38–126)
Anion gap: 8 (ref 5–15)
BUN: 23 mg/dL (ref 8–23)
CO2: 28 mmol/L (ref 22–32)
Calcium: 9 mg/dL (ref 8.9–10.3)
Chloride: 103 mmol/L (ref 98–111)
Creatinine, Ser: 0.77 mg/dL (ref 0.44–1.00)
GFR, Estimated: >60 mL/min (ref >60)
Glucose, Bld: 98 mg/dL (ref 70–99)
Potassium: 4.2 mmol/L (ref 3.5–5.1)
Sodium: 139 mmol/L (ref 135–145)
Total Bilirubin: 0.3 mg/dL (ref 0.3–1.2)
Total Protein: 7.4 g/dL (ref 6.5–8.1)

## 2020-10-20 LAB — TSH: TSH: 3.505 u[IU]/mL (ref 0.308–3.960)

## 2020-10-20 MED ORDER — HYDROCODONE-ACETAMINOPHEN 5-325 MG PO TABS: 1.0000 | ORAL_TABLET | Freq: Four times a day (QID) | ORAL | 0 refills | Status: DC | PRN

## 2020-10-20 MED ORDER — FLUCONAZOLE 100 MG PO TABS: 100.0000 mg | ORAL_TABLET | Freq: Every day | ORAL | 0 refills | Status: DC

## 2020-10-20 MED ORDER — SODIUM CHLORIDE 0.9% FLUSH
10.0000 mL | Freq: Once | INTRAVENOUS | Status: AC
  Administered 2020-10-20: 10 mL
  Filled 2020-10-20: qty 10

## 2020-10-20 MED ORDER — SODIUM CHLORIDE 0.9 % IV SOLN
Freq: Once | INTRAVENOUS | Status: AC
  Filled 2020-10-20: qty 250

## 2020-10-20 MED ORDER — SODIUM CHLORIDE 0.9 % IV SOLN
200.0000 mg | Freq: Once | INTRAVENOUS | Status: AC
  Administered 2020-10-20: 200 mg via INTRAVENOUS
  Filled 2020-10-20: qty 8

## 2020-10-20 NOTE — Assessment & Plan Note (Signed)
She has persistent recurrent oral thrush I recommend another course of fluconazole We discussed the importance of aggressive dental hygiene and to brush her tongue and the roof of the mouth with a soft toothbrush if possible

## 2020-10-20 NOTE — Patient Instructions (Signed)
[facility] Discharge Instructions for Patients Receiving Chemotherapy  Today you received the following chemotherapy agents:  Keytruda.  To help prevent nausea and vomiting after your treatment, we encourage you to take your nausea medication as directed.   If you develop nausea and vomiting that is not controlled by your nausea medication, call the clinic.   BELOW ARE SYMPTOMS THAT SHOULD BE REPORTED IMMEDIATELY:  *FEVER GREATER THAN 100.5 F  *CHILLS WITH OR WITHOUT FEVER  NAUSEA AND VOMITING THAT IS NOT CONTROLLED WITH YOUR NAUSEA MEDICATION  *UNUSUAL SHORTNESS OF BREATH  *UNUSUAL BRUISING OR BLEEDING  TENDERNESS IN MOUTH AND THROAT WITH OR WITHOUT PRESENCE OF ULCERS  *URINARY PROBLEMS  *BOWEL PROBLEMS  UNUSUAL RASH Items with * indicate a potential emergency and should be followed up as soon as possible.  Feel free to call the clinic should you have any questions or concerns. The clinic phone number is [phone].  Please show the CHEMO ALERT CARD at check-in to the Emergency Department and triage nurse.    

## 2020-10-20 NOTE — Assessment & Plan Note (Signed)
She had extreme poor dentition that was not taken care of prior to radiation treatment I agree for her to proceed with dental extraction in February as scheduled

## 2020-10-20 NOTE — Assessment & Plan Note (Signed)
Her pain is well controlled We discussed narcotic refill policy

## 2020-10-20 NOTE — Patient Instructions (Signed)

## 2020-10-20 NOTE — Progress Notes (Signed)
Pt discharged in no apparent distress. Pt left ambulatory without assistance. Pt aware of discharge instructions and verbalized understanding and had no further questions.  

## 2020-10-20 NOTE — Assessment & Plan Note (Signed)
Her last CT imaging show improved disease control We will continue immunotherapy indefinitely I plan to repeat imaging study again in a few months, due next around April 2022

## 2020-10-20 NOTE — Pre-Procedure Instructions (Signed)
[id number] - [place], [address] [place] [address] Phone: [phone] Fax: [phone]      Your procedure is scheduled on Thursday, February 3rd at 7:30 A.M.  Report to [facility] Main Entrance "A" at 5:30 A.M., and check in at the Admitting office.  Call this number if you have problems the morning of surgery:  [phone]  Call [phone] if you have any questions prior to your surgery date Monday-Friday 8am-4pm    Remember:  Do not eat or drink after midnight the night before your surgery    Take these medicines the morning of surgery with A SIP OF WATER  fluconazole (DIFLUCAN)  levothyroxine (SYNTHROID) chlorhexidine (PERIDEX)   HYDROcodone-acetaminophen (NORCO) -use as needed ondansetron (ZOFRAN) -use as needed prochlorperazine (COMPAZINE)-use as needed  As of today, STOP taking any Aspirin (unless otherwise instructed by your surgeon) Aleve, Naproxen, Ibuprofen, Motrin, Advil, Goody's, BC's, all herbal medications, fish oil, and all vitamins.                     Do not wear jewelry, make up, or nail polish.            Do not wear lotions, powders, perfumes, or deodorant.            Do not shave 48 hours prior to surgery.              Do not bring valuables to the hospital.            [facility] is not responsible for any belongings or valuables.  Do NOT Smoke (Tobacco/Vaping) or drink Alcohol 24 hours prior to your procedure If you use a CPAP at night, you may bring all equipment for your overnight stay.   Contacts, glasses, dentures or bridgework may not be worn into surgery.      For patients admitted to the hospital, discharge time will be determined by your treatment team.   Patients discharged the day of surgery will not be allowed to drive home, and someone needs to stay with them for 24 hours.    Special instructions:   [name]- Preparing For Surgery  Before surgery, you can  play an important role. Because skin is not sterile, your skin needs to be as free of germs as possible. You can reduce the number of germs on your skin by washing with CHG (chlorahexidine gluconate) Soap before surgery.  CHG is an antiseptic cleaner which kills germs and bonds with the skin to continue killing germs even after washing.    Oral Hygiene is also important to reduce your risk of infection.  Remember - BRUSH YOUR TEETH THE MORNING OF SURGERY WITH YOUR REGULAR TOOTHPASTE  Please do not use if you have an allergy to CHG or antibacterial soaps. If your skin becomes reddened/irritated stop using the CHG.  Do not shave (including legs and underarms) for at least 48 hours prior to first CHG shower. It is OK to shave your face.  Please follow these instructions carefully.   1. Shower the NIGHT BEFORE SURGERY and the MORNING OF SURGERY with CHG Soap.   2. If you chose to wash your hair, wash your hair first as usual with your normal shampoo.  3. After you shampoo, rinse your hair and body thoroughly to remove the shampoo.  4. Use CHG as you would any other liquid soap. You can apply CHG directly to the skin and  wash gently with a scrungie or a clean washcloth.   5. Apply the CHG Soap to your body ONLY FROM THE NECK DOWN.  Do not use on open wounds or open sores. Avoid contact with your eyes, ears, mouth and genitals (private parts). Wash Face and genitals (private parts)  with your normal soap.   6. Wash thoroughly, paying special attention to the area where your surgery will be performed.  7. Thoroughly rinse your body with warm water from the neck down.  8. DO NOT shower/wash with your normal soap after using and rinsing off the CHG Soap.  9. Pat yourself dry with a CLEAN TOWEL.  10. Wear CLEAN PAJAMAS to bed the night before surgery  11. Place CLEAN SHEETS on your bed the night of your first shower and DO NOT SLEEP WITH PETS.   Day of Surgery: Wear Clean/Comfortable clothing  the morning of surgery Do not apply any deodorants/lotions.   Remember to brush your teeth WITH YOUR REGULAR TOOTHPASTE.   Please read over the following fact sheets that you were given.

## 2020-10-20 NOTE — H&P (View-Only) (Signed)
[place] OFFICE PROGRESS NOTE  Patient Care Team: Hamrick, Lorin Mercy, MD as PCP - General (Family Medicine)  ASSESSMENT & PLAN:  Laryngeal squamous cell carcinoma ([name] [name]) Her last CT imaging show improved disease control We will continue immunotherapy indefinitely I plan to repeat imaging study again in a few months, due next around April 2022  Cancer associated pain Her pain is well controlled We discussed narcotic refill policy   Oral thrush She has persistent recurrent oral thrush I recommend another course of fluconazole We discussed the importance of aggressive dental hygiene and to brush her tongue and the roof of the mouth with a soft toothbrush if possible  Poor dentition She had extreme poor dentition that was not taken care of prior to radiation treatment I agree for her to proceed with dental extraction in February as scheduled   No orders of the defined types were placed in this encounter.   All questions were answered. The patient knows to call the clinic with any problems, questions or concerns. The total time spent in the appointment was 20 minutes encounter with patients including review of chart and various tests results, discussions about plan of care and coordination of care plan   [name] Lark, MD 10/20/2020 9:57 AM  INTERVAL HISTORY: Please see below for problem oriented charting. She returns with her son for further follow-up She tolerated recent treatment well She continues to battle with pain within her mouth Appetite is fair and she is gaining weight  SUMMARY OF ONCOLOGIC HISTORY: Oncology History  Laryngeal squamous cell carcinoma ([name] [name])  12/02/2018 Imaging   CT neck: IMPRESSION: 1. Supraglottic laryngeal carcinoma extending across the anterior commissure and invading the prelaryngeal fat, left aryepiglottic fold and left aspect of the epiglottis. 2. Severe narrowing of the laryngeal airway. 3. Bilateral subcentimeter level 2A and  2A cervical lymph nodes.     12/02/2018 Imaging   CT  Chest:  IMPRESSION: No evidence of metastatic disease in the chest.   12/03/2018 Pathology Results   (Care Everywhere; [facility]) A: Larynx, supraglottis, biopsy - Atypical squamous proliferation with at least low grade dysplasia and abundant keratinization - No definite high grade dysplasia or invasive carcinoma represented (see comment)   12/18/2018 Pathology Results   (Care Everywhere; [facility]) A:  Neck, left, fine needle aspiration - Rare single atypical cells, no cells diagnostic of malignancy - Lymph node material present  B:  Neck, right, fine needle aspiration - Non-diagnostic specimen - No lymph node material identified - Mixed inflammation and blood   12/31/2018 Pathology Results   (Care Everywhere; [facility])  A: Larynx, left supraglottic mass, biopsy - Atypical squamous proliferation with abundant keratinization, suspicious for well-differentiated invasive squamous cell carcinoma  B: Larynx, left supraglottic mass, biopsy - Well-differentiated invasive squamous cell carcinoma with abundant keratinization (see comment)   01/18/2019 Initial Diagnosis   Laryngeal cancer ([name] [name])   01/26/2019 Imaging   1. Locally advanced hypermetabolic laryngeal neoplasm centered in the left supraglottic space with bilateral glottic and epiglottic involvement and subglottic extension on the left. 2. Hypermetabolic bilateral level 3 neck nodal metastases. 3. Hypermetabolic high mediastinal nodal metastases between the trachea and upper thoracic esophagus. 4. Otherwise no distant hypermetabolic metastatic disease. 5. Nodular 7 mm focus at the left lung base, below PET resolution, recommend attention on follow-up chest CT in 3 months. 6. Chronic findings include: Aortic Atherosclerosis (ICD10-I70.0). Cholelithiasis.   02/08/2019 Cancer Staging   Staging form: Larynx - Supraglottis, AJCC 8th Edition - Clinical  stage from 02/08/2019:  Stage IVA (cT3, cN2c, cM0) - Signed by Eppie Gibson, MD on 02/08/2019   02/11/2019 - 04/07/2019 Chemotherapy   The patient weekly cisplatin for chemotherapy treatment.     07/20/2019 Pathology Results   PROCEDURE: Total glossectomy, total pharyngectomy, total laryngectomy, total thyroidectomy, and cervical esophagectomy TUMOR SITE: Larynx TUMOR LATERALITY: Not specified TUMOR FOCALITY: Unifocal TUMOR SIZE:    GREATEST DIMENSION: 6.9 cm    ADDITIONAL DIMENSIONS: 5.4 x 2.4 cm HISTOLOGIC TYPE: Squamous cell carcinoma, conventional (keratinizing) HISTOLOGIC GRADE:  G1-G2: Well to moderately differentiated TUMOR EXTENSION: Tumor involves the posterior tongue, pharynx, larynx, tracheal cartilage, hyoid bone, cervical esophagus, vagus nerve, carotid artery, deep cervical fascia, ans surrounding skeletal muscle and soft tissue MARGINS: Uninvolved by invasive tumor; Perineural invasion is less than 51mm from the left soft tissue margin LYMPHOVASCULAR INVASION: Not identified PERINEURAL INVASION:  Present REGIONAL LYMPH NODES:     NUMBER OF LYMPH NODES INVOLVED: 2     NUMBER OF LYMPH NODES EXAMINED: 24     LATERALITY OF LYMPH NODES INVOLVED: Cannot determine     SIZE OF LARGEST METASTATIC DEPOSIT: 0.8 cm     EXTRANODAL EXTENSION: Not identified PATHOLOGIC STAGE CLASSIFICATION (pTNM, AJCC 8TH Ed): (r)(y) pT4b pN2a DISTANT METASTASIS (pM): ADDITIONAL PATHOLOGIC FINDINGS: Previous chemoradiation   11/04/2019 Imaging   PET: 1.  Hypermetabolic bilateral neck lesions as detailed above are concerning for disease recurrence. 2.  Compared to 07/08/2019 PET scan, interval development of subcentimeter bilateral upper lobe pulmonary nodules, size below PET resolution. These are concerning for metastasis. Consider diagnostic quality CT chest exam for further evaluation as per oncology treatment planning. 3.  Focal hypermetabolic uptake in right ventricular cavity of indeterminate significance.  Recommend echocardiographic examination to rule out underlying thrombus. This finding was discussed with Dr. Conley Canal by Dr. Clementeen Graham and Dr. Marcello Moores on 11/04/2019 at approximately 3:00 PM via telephone. 4.  Ancillary CT findings as above.   11/05/2019 Imaging   Echocardiogram: Summary   1. The left ventricle is normal in size with normal wall thickness.   2. The left ventricular systolic function is normal, LVEF is visually estimated at 60-65%.   3. The right ventricle is normal in size, with normal systolic function.   4. There is an extrinsic compression of the RV and RA laterally at the AV groove by a mass of 4.3 cm diameter. Unclear if the mass is invasive, although no pericardial effusion seen suggestive no invasion possibly. There is no evidence of hemodynamic compromise secondary to cardiac compression with normal IVC diameter.   12/06/2019 Pathology Results   A. LYMPH NODE, LEFT SUBMANDIBULAR, BIOPSY:  - Squamous cell carcinoma.  - No distinct nodal tissue identified.    12/06/2019 Procedure   Successful ultrasound-guided core biopsy of centrally necrotic left submandibular lymph node. Of note, due to extensive prior postsurgical and post radiation changes, patient has very limited mobility in the neck and the biopsy was extremely challenging    12/16/2019 -  Chemotherapy   The patient had pembrolizumab for chemotherapy treatment.     03/08/2020 PET scan   1. Diminished size of masses in the neck as described. Persistent increased metabolic activity particularly in the LEFT neck. Some of this diminished size on the LEFT could be due to decompression of necrotic material. Correlate with any symptoms of inflammation or infection in this area as well given the paucity of residual soft tissue associated with the above abnormality, overall findings are compatible with residual disease in this location  and there is no significant surrounding stranding to indicate inflammation that would  correlate with the degree of FDG uptake remaining. 2. Diminished activity in the juxta cardiac mass with peripheral activity suggesting interval necrosis, on the prior study this was uniformly hypermetabolic. There is still considerable FDG uptake. 3. No signs of disease in the abdomen or pelvis. 4. 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 Correlate with any new pain or symptoms in this area with further imaging as warranted. No discrete mass or bone destruction on today's study.   06/08/2020 Imaging   CT neck 1. Status post laryngectomy and neck dissection without lymphadenopathy or mass lesion. 2. Short segment severe stenosis of the left internal carotid artery adjacent to a surgical clip.     06/08/2020 Imaging   CT chest 1. There is a large, hypodense lesion within, or very closely abutting and likely intruding upon the right ventricle, as seen on prior examination and approximately 4.6 x 4.3 cm, poorly evaluated due to cardiac motion. Findings are consistent with metastatic lesion and/or thrombus and poorly assessed by ungated CT. Consider echocardiography and cardiac MRI for further assessment.   2. Multiple small pulmonary nodules in the bilateral lung apices are unchanged. Attention on follow-up.   3.  No evidence of new metastatic disease in the chest.   4.  Aortic Atherosclerosis (ICD10-I70.0).     09/07/2020 Imaging   1. Mild decrease in size of juxta cardiac metastatic involving the right ventricle. 2. Small pulmonary nodules are unchanged from previous exam. 3. Aortic atherosclerosis. 4. Gallstone.       REVIEW OF SYSTEMS:   Constitutional: Denies fevers, chills or abnormal weight loss Eyes: Denies blurriness of vision Ears, nose, mouth, throat, and face: Denies mucositis or sore throat Respiratory: Denies cough, dyspnea or wheezes Cardiovascular: Denies palpitation, chest discomfort or lower extremity swelling Gastrointestinal:  Denies nausea, heartburn or  change in bowel habits Skin: Denies abnormal skin rashes Lymphatics: Denies new lymphadenopathy or easy bruising Neurological:Denies numbness, tingling or new weaknesses Behavioral/Psych: Mood is stable, no new changes  All other systems were reviewed with the patient and are negative.  I have reviewed the past medical history, past surgical history, social history and family history with the patient and they are unchanged from previous note.  ALLERGIES:  has No Known Allergies.  MEDICATIONS:  Current Outpatient Medications  Medication Sig Dispense Refill  . fluconazole (DIFLUCAN) 100 MG tablet Take 1 tablet (100 mg total) by mouth daily. 7 tablet 0  . chlorhexidine (PERIDEX) 0.12 % solution Rinse with 15 mls twice daily for 30 seconds. Use after breakfast and at bedtime. Spit out excess. Do not swallow. (Patient taking differently: Use as directed 15 mLs in the mouth or throat 2 (two) times daily. Rinse with 15 mls twice daily for 30 seconds. Use after breakfast and at bedtime. Spit out excess. Do not swallow.) 480 mL 3  . Cholecalciferol (VITAMIN D3) 125 MCG (5000 UT) CAPS Take 5,000 Units by mouth daily.    Marland Kitchen HYDROcodone-acetaminophen (NORCO) 5-325 MG tablet Take 1 tablet by mouth every 6 (six) hours as needed for moderate pain. 60 tablet 0  . levothyroxine (SYNTHROID) 137 MCG tablet Take 137 mcg by mouth daily before breakfast.    . lidocaine-prilocaine (EMLA) cream Apply to affected area once (Patient taking differently: Apply 1 application topically daily as needed (port access). Apply to affected area once) 30 g 3  . morphine (MS CONTIN) 15 MG 12 hr tablet Take 15 mg by mouth  at bedtime as needed for pain.    . Nutritional Supplements (KATE FARMS PEPTIDE 1.5) LIQD Take 4 Bottles by mouth daily. 325 mL 11  . ondansetron (ZOFRAN) 8 MG tablet Take 1 tablet (8 mg total) by mouth 2 (two) times daily as needed (Nausea or vomiting). 30 tablet 1  . prochlorperazine (COMPAZINE) 10 MG tablet Take  1 tablet (10 mg total) by mouth every 6 (six) hours as needed (Nausea or vomiting). 30 tablet 1  . sodium fluoride (PREVIDENT 5000 PLUS) 1.1 % CREA dental cream Apply cream to tooth brush. Brush teeth for 2 minutes. Spit out excess. DO NOT rinse afterwards. Repeat nightly. (Patient taking differently: Place 1 application onto teeth 2 (two) times a day.) 1 Tube prn   No current facility-administered medications for this visit.   Facility-Administered Medications Ordered in Other Visits  Medication Dose Route Frequency Provider Last Rate Last Admin  . 0.9 %  sodium chloride infusion   Intravenous Once Alvy Bimler, Kanoelani Dobies, MD      . pembrolizumab (KEYTRUDA) 200 mg in sodium chloride 0.9 % 50 mL chemo infusion  200 mg Intravenous Once Alvy Bimler, Joshua Zeringue, MD        PHYSICAL EXAMINATION: ECOG PERFORMANCE STATUS: 1 - Symptomatic but completely ambulatory  Vitals:   10/20/20 0932  BP: 104/62  Pulse: 79  Resp: 18  Temp: (!) 97.5 F (36.4 C)  SpO2: 97%   Filed Weights   10/20/20 0932  Weight: 141 lb 9.6 oz (64.2 kg)    GENERAL:alert, no distress and comfortable SKIN: skin color, texture, turgor are normal, no rashes or significant lesions EYES: normal, Conjunctiva are pink and non-injected, sclera clear OROPHARYNX: Noted poor dentition. Oral thrush is noted at the roof of the mouth.  NECK: Tracheostomy in situ LYMPH:  no palpable lymphadenopathy in the cervical, axillary or inguinal LUNGS: clear to auscultation and percussion with normal breathing effort HEART: regular rate & rhythm and no murmurs and no lower extremity edema ABDOMEN:abdomen soft, non-tender and normal bowel sounds. Feeding tube site looks okay Musculoskeletal:no cyanosis of digits and no clubbing  NEURO: alert & oriented  LABORATORY DATA:  I have reviewed the data as listed    Component Value Date/Time   NA 139 10/20/2020 0909   K 4.2 10/20/2020 0909   CL 103 10/20/2020 0909   CO2 28 10/20/2020 0909   GLUCOSE 98 10/20/2020  0909   BUN 23 10/20/2020 0909   CREATININE 0.77 10/20/2020 0909   CREATININE 0.60 02/17/2020 1128   CALCIUM 9.0 10/20/2020 0909   PROT 7.4 10/20/2020 0909   ALBUMIN 3.9 10/20/2020 0909   AST 19 10/20/2020 0909   AST 22 02/17/2020 1128   ALT 16 10/20/2020 0909   ALT 24 02/17/2020 1128   ALKPHOS 69 10/20/2020 0909   BILITOT 0.3 10/20/2020 0909   BILITOT 0.5 02/17/2020 1128   GFRNONAA >60 10/20/2020 0909   GFRNONAA >60 02/17/2020 1128   GFRAA >60 06/08/2020 1303   GFRAA >60 02/17/2020 1128    No results found for: SPEP, UPEP  Lab Results  Component Value Date   WBC 4.6 10/20/2020   NEUTROABS 2.5 10/20/2020   HGB 13.4 10/20/2020   HCT 40.9 10/20/2020   MCV 89.3 10/20/2020   PLT 215 10/20/2020      Chemistry      Component Value Date/Time   NA 139 10/20/2020 0909   K 4.2 10/20/2020 0909   CL 103 10/20/2020 0909   CO2 28 10/20/2020 0909   BUN 23  10/20/2020 0909   CREATININE 0.77 10/20/2020 0909   CREATININE 0.60 02/17/2020 1128      Component Value Date/Time   CALCIUM 9.0 10/20/2020 0909   ALKPHOS 69 10/20/2020 0909   AST 19 10/20/2020 0909   AST 22 02/17/2020 1128   ALT 16 10/20/2020 0909   ALT 24 02/17/2020 1128   BILITOT 0.3 10/20/2020 0909   BILITOT 0.5 02/17/2020 1128

## 2020-10-20 NOTE — Progress Notes (Signed)
[facility] OFFICE PROGRESS NOTE  Patient Care Team: Hamrick, Lorin Mercy, MD as PCP - General (Family Medicine)  ASSESSMENT & PLAN:  Laryngeal squamous cell carcinoma ([place]) Her last CT imaging show improved disease control We will continue immunotherapy indefinitely I plan to repeat imaging study again in a few months, due next around April 2022  Cancer associated pain Her pain is well controlled We discussed narcotic refill policy   Oral thrush She has persistent recurrent oral thrush I recommend another course of fluconazole We discussed the importance of aggressive dental hygiene and to brush her tongue and the roof of the mouth with a soft toothbrush if possible  Poor dentition She had extreme poor dentition that was not taken care of prior to radiation treatment I agree for her to proceed with dental extraction in February as scheduled   No orders of the defined types were placed in this encounter.   All questions were answered. The patient knows to call the clinic with any problems, questions or concerns. The total time spent in the appointment was 20 minutes encounter with patients including review of chart and various tests results, discussions about plan of care and coordination of care plan   Heath Lark, MD 10/20/2020 9:57 AM  INTERVAL HISTORY: Please see below for problem oriented charting. She returns with her son for further follow-up She tolerated recent treatment well She continues to battle with pain within her mouth Appetite is fair and she is gaining weight  SUMMARY OF ONCOLOGIC HISTORY: Oncology History  Laryngeal squamous cell carcinoma ([place])  12/02/2018 Imaging   CT neck: IMPRESSION: 1. Supraglottic laryngeal carcinoma extending across the anterior commissure and invading the prelaryngeal fat, left aryepiglottic fold and left aspect of the epiglottis. 2. Severe narrowing of the laryngeal airway. 3. Bilateral subcentimeter level 2A and  2A cervical lymph nodes.     12/02/2018 Imaging   CT  Chest:  IMPRESSION: No evidence of metastatic disease in the chest.   12/03/2018 Pathology Results   (Care Everywhere; [facility]) A: Larynx, supraglottis, biopsy - Atypical squamous proliferation with at least low grade dysplasia and abundant keratinization - No definite high grade dysplasia or invasive carcinoma represented (see comment)   12/18/2018 Pathology Results   (Care Everywhere; [facility]) A:  Neck, left, fine needle aspiration - Rare single atypical cells, no cells diagnostic of malignancy - Lymph node material present  B:  Neck, right, fine needle aspiration - Non-diagnostic specimen - No lymph node material identified - Mixed inflammation and blood   12/31/2018 Pathology Results   (Care Everywhere; [facility])  A: Larynx, left supraglottic mass, biopsy - Atypical squamous proliferation with abundant keratinization, suspicious for well-differentiated invasive squamous cell carcinoma  B: Larynx, left supraglottic mass, biopsy - Well-differentiated invasive squamous cell carcinoma with abundant keratinization (see comment)   01/18/2019 Initial Diagnosis   Laryngeal cancer ([place])   01/26/2019 Imaging   1. Locally advanced hypermetabolic laryngeal neoplasm centered in the left supraglottic space with bilateral glottic and epiglottic involvement and subglottic extension on the left. 2. Hypermetabolic bilateral level 3 neck nodal metastases. 3. Hypermetabolic high mediastinal nodal metastases between the trachea and upper thoracic esophagus. 4. Otherwise no distant hypermetabolic metastatic disease. 5. Nodular 7 mm focus at the left lung base, below PET resolution, recommend attention on follow-up chest CT in 3 months. 6. Chronic findings include: Aortic Atherosclerosis (ICD10-I70.0). Cholelithiasis.   02/08/2019 Cancer Staging   Staging form: Larynx - Supraglottis, AJCC 8th Edition - Clinical  stage from 02/08/2019:  Stage IVA (cT3, cN2c, cM0) - Signed by Eppie Gibson, MD on 02/08/2019   02/11/2019 - 04/07/2019 Chemotherapy   The patient weekly cisplatin for chemotherapy treatment.     07/20/2019 Pathology Results   PROCEDURE: Total glossectomy, total pharyngectomy, total laryngectomy, total thyroidectomy, and cervical esophagectomy TUMOR SITE: Larynx TUMOR LATERALITY: Not specified TUMOR FOCALITY: Unifocal TUMOR SIZE:    GREATEST DIMENSION: 6.9 cm    ADDITIONAL DIMENSIONS: 5.4 x 2.4 cm HISTOLOGIC TYPE: Squamous cell carcinoma, conventional (keratinizing) HISTOLOGIC GRADE:  G1-G2: Well to moderately differentiated TUMOR EXTENSION: Tumor involves the posterior tongue, pharynx, larynx, tracheal cartilage, hyoid bone, cervical esophagus, vagus nerve, carotid artery, deep cervical fascia, ans surrounding skeletal muscle and soft tissue MARGINS: Uninvolved by invasive tumor; Perineural invasion is less than 35mm from the left soft tissue margin LYMPHOVASCULAR INVASION: Not identified PERINEURAL INVASION:  Present REGIONAL LYMPH NODES:     NUMBER OF LYMPH NODES INVOLVED: 2     NUMBER OF LYMPH NODES EXAMINED: 24     LATERALITY OF LYMPH NODES INVOLVED: Cannot determine     SIZE OF LARGEST METASTATIC DEPOSIT: 0.8 cm     EXTRANODAL EXTENSION: Not identified PATHOLOGIC STAGE CLASSIFICATION (pTNM, AJCC 8TH Ed): (r)(y) pT4b pN2a DISTANT METASTASIS (pM): ADDITIONAL PATHOLOGIC FINDINGS: Previous chemoradiation   11/04/2019 Imaging   PET: 1.  Hypermetabolic bilateral neck lesions as detailed above are concerning for disease recurrence. 2.  Compared to 07/08/2019 PET scan, interval development of subcentimeter bilateral upper lobe pulmonary nodules, size below PET resolution. These are concerning for metastasis. Consider diagnostic quality CT chest exam for further evaluation as per oncology treatment planning. 3.  Focal hypermetabolic uptake in right ventricular cavity of indeterminate significance.  Recommend echocardiographic examination to rule out underlying thrombus. This finding was discussed with Dr. Conley Canal by Dr. Clementeen Graham and Dr. Marcello Moores on 11/04/2019 at approximately 3:00 PM via telephone. 4.  Ancillary CT findings as above.   11/05/2019 Imaging   Echocardiogram: Summary   1. The left ventricle is normal in size with normal wall thickness.   2. The left ventricular systolic function is normal, LVEF is visually estimated at 60-65%.   3. The right ventricle is normal in size, with normal systolic function.   4. There is an extrinsic compression of the RV and RA laterally at the AV groove by a mass of 4.3 cm diameter. Unclear if the mass is invasive, although no pericardial effusion seen suggestive no invasion possibly. There is no evidence of hemodynamic compromise secondary to cardiac compression with normal IVC diameter.   12/06/2019 Pathology Results   A. LYMPH NODE, LEFT SUBMANDIBULAR, BIOPSY:  - Squamous cell carcinoma.  - No distinct nodal tissue identified.    12/06/2019 Procedure   Successful ultrasound-guided core biopsy of centrally necrotic left submandibular lymph node. Of note, due to extensive prior postsurgical and post radiation changes, patient has very limited mobility in the neck and the biopsy was extremely challenging    12/16/2019 -  Chemotherapy   The patient had pembrolizumab for chemotherapy treatment.     03/08/2020 PET scan   1. Diminished size of masses in the neck as described. Persistent increased metabolic activity particularly in the LEFT neck. Some of this diminished size on the LEFT could be due to decompression of necrotic material. Correlate with any symptoms of inflammation or infection in this area as well given the paucity of residual soft tissue associated with the above abnormality, overall findings are compatible with residual disease in this location  and there is no significant surrounding stranding to indicate inflammation that would  correlate with the degree of FDG uptake remaining. 2. Diminished activity in the juxta cardiac mass with peripheral activity suggesting interval necrosis, on the prior study this was uniformly hypermetabolic. There is still considerable FDG uptake. 3. No signs of disease in the abdomen or pelvis. 4. 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 Correlate with any new pain or symptoms in this area with further imaging as warranted. No discrete mass or bone destruction on today's study.   06/08/2020 Imaging   CT neck 1. Status post laryngectomy and neck dissection without lymphadenopathy or mass lesion. 2. Short segment severe stenosis of the left internal carotid artery adjacent to a surgical clip.     06/08/2020 Imaging   CT chest 1. There is a large, hypodense lesion within, or very closely abutting and likely intruding upon the right ventricle, as seen on prior examination and approximately 4.6 x 4.3 cm, poorly evaluated due to cardiac motion. Findings are consistent with metastatic lesion and/or thrombus and poorly assessed by ungated CT. Consider echocardiography and cardiac MRI for further assessment.   2. Multiple small pulmonary nodules in the bilateral lung apices are unchanged. Attention on follow-up.   3.  No evidence of new metastatic disease in the chest.   4.  Aortic Atherosclerosis (ICD10-I70.0).     09/07/2020 Imaging   1. Mild decrease in size of juxta cardiac metastatic involving the right ventricle. 2. Small pulmonary nodules are unchanged from previous exam. 3. Aortic atherosclerosis. 4. Gallstone.       REVIEW OF SYSTEMS:   Constitutional: Denies fevers, chills or abnormal weight loss Eyes: Denies blurriness of vision Ears, nose, mouth, throat, and face: Denies mucositis or sore throat Respiratory: Denies cough, dyspnea or wheezes Cardiovascular: Denies palpitation, chest discomfort or lower extremity swelling Gastrointestinal:  Denies nausea, heartburn or  change in bowel habits Skin: Denies abnormal skin rashes Lymphatics: Denies new lymphadenopathy or easy bruising Neurological:Denies numbness, tingling or new weaknesses Behavioral/Psych: Mood is stable, no new changes  All other systems were reviewed with the patient and are negative.  I have reviewed the past medical history, past surgical history, social history and family history with the patient and they are unchanged from previous note.  ALLERGIES:  has No Known Allergies.  MEDICATIONS:  Current Outpatient Medications  Medication Sig Dispense Refill  . fluconazole (DIFLUCAN) 100 MG tablet Take 1 tablet (100 mg total) by mouth daily. 7 tablet 0  . chlorhexidine (PERIDEX) 0.12 % solution Rinse with 15 mls twice daily for 30 seconds. Use after breakfast and at bedtime. Spit out excess. Do not swallow. (Patient taking differently: Use as directed 15 mLs in the mouth or throat 2 (two) times daily. Rinse with 15 mls twice daily for 30 seconds. Use after breakfast and at bedtime. Spit out excess. Do not swallow.) 480 mL 3  . Cholecalciferol (VITAMIN D3) 125 MCG (5000 UT) CAPS Take 5,000 Units by mouth daily.    Marland Kitchen HYDROcodone-acetaminophen (NORCO) 5-325 MG tablet Take 1 tablet by mouth every 6 (six) hours as needed for moderate pain. 60 tablet 0  . levothyroxine (SYNTHROID) 137 MCG tablet Take 137 mcg by mouth daily before breakfast.    . lidocaine-prilocaine (EMLA) cream Apply to affected area once (Patient taking differently: Apply 1 application topically daily as needed (port access). Apply to affected area once) 30 g 3  . morphine (MS CONTIN) 15 MG 12 hr tablet Take 15 mg by mouth  at bedtime as needed for pain.    . Nutritional Supplements (KATE FARMS PEPTIDE 1.5) LIQD Take 4 Bottles by mouth daily. 325 mL 11  . ondansetron (ZOFRAN) 8 MG tablet Take 1 tablet (8 mg total) by mouth 2 (two) times daily as needed (Nausea or vomiting). 30 tablet 1  . prochlorperazine (COMPAZINE) 10 MG tablet Take  1 tablet (10 mg total) by mouth every 6 (six) hours as needed (Nausea or vomiting). 30 tablet 1  . sodium fluoride (PREVIDENT 5000 PLUS) 1.1 % CREA dental cream Apply cream to tooth brush. Brush teeth for 2 minutes. Spit out excess. DO NOT rinse afterwards. Repeat nightly. (Patient taking differently: Place 1 application onto teeth 2 (two) times a day.) 1 Tube prn   No current facility-administered medications for this visit.   Facility-Administered Medications Ordered in Other Visits  Medication Dose Route Frequency Provider Last Rate Last Admin  . 0.9 %  sodium chloride infusion   Intravenous Once Alvy Bimler, Kobee Medlen, MD      . pembrolizumab (KEYTRUDA) 200 mg in sodium chloride 0.9 % 50 mL chemo infusion  200 mg Intravenous Once Alvy Bimler, Graceson Nichelson, MD        PHYSICAL EXAMINATION: ECOG PERFORMANCE STATUS: 1 - Symptomatic but completely ambulatory  Vitals:   10/20/20 0932  BP: 104/62  Pulse: 79  Resp: 18  Temp: (!) 97.5 F (36.4 C)  SpO2: 97%   Filed Weights   10/20/20 0932  Weight: 141 lb 9.6 oz (64.2 kg)    GENERAL:alert, no distress and comfortable SKIN: skin color, texture, turgor are normal, no rashes or significant lesions EYES: normal, Conjunctiva are pink and non-injected, sclera clear OROPHARYNX: Noted poor dentition. Oral thrush is noted at the roof of the mouth.  NECK: Tracheostomy in situ LYMPH:  no palpable lymphadenopathy in the cervical, axillary or inguinal LUNGS: clear to auscultation and percussion with normal breathing effort HEART: regular rate & rhythm and no murmurs and no lower extremity edema ABDOMEN:abdomen soft, non-tender and normal bowel sounds. Feeding tube site looks okay Musculoskeletal:no cyanosis of digits and no clubbing  NEURO: alert & oriented  LABORATORY DATA:  I have reviewed the data as listed    Component Value Date/Time   NA 139 10/20/2020 0909   K 4.2 10/20/2020 0909   CL 103 10/20/2020 0909   CO2 28 10/20/2020 0909   GLUCOSE 98 10/20/2020  0909   BUN 23 10/20/2020 0909   CREATININE 0.77 10/20/2020 0909   CREATININE 0.60 02/17/2020 1128   CALCIUM 9.0 10/20/2020 0909   PROT 7.4 10/20/2020 0909   ALBUMIN 3.9 10/20/2020 0909   AST 19 10/20/2020 0909   AST 22 02/17/2020 1128   ALT 16 10/20/2020 0909   ALT 24 02/17/2020 1128   ALKPHOS 69 10/20/2020 0909   BILITOT 0.3 10/20/2020 0909   BILITOT 0.5 02/17/2020 1128   GFRNONAA >60 10/20/2020 0909   GFRNONAA >60 02/17/2020 1128   GFRAA >60 06/08/2020 1303   GFRAA >60 02/17/2020 1128    No results found for: SPEP, UPEP  Lab Results  Component Value Date   WBC 4.6 10/20/2020   NEUTROABS 2.5 10/20/2020   HGB 13.4 10/20/2020   HCT 40.9 10/20/2020   MCV 89.3 10/20/2020   PLT 215 10/20/2020      Chemistry      Component Value Date/Time   NA 139 10/20/2020 0909   K 4.2 10/20/2020 0909   CL 103 10/20/2020 0909   CO2 28 10/20/2020 0909   BUN 23  10/20/2020 0909   CREATININE 0.77 10/20/2020 0909   CREATININE 0.60 02/17/2020 1128      Component Value Date/Time   CALCIUM 9.0 10/20/2020 0909   ALKPHOS 69 10/20/2020 0909   AST 19 10/20/2020 0909   AST 22 02/17/2020 1128   ALT 16 10/20/2020 0909   ALT 24 02/17/2020 1128   BILITOT 0.3 10/20/2020 0909   BILITOT 0.5 02/17/2020 1128

## 2020-10-23 ENCOUNTER — Other Ambulatory Visit: Payer: Self-pay

## 2020-10-23 ENCOUNTER — Encounter (HOSPITAL_COMMUNITY)
Admission: RE | Admit: 2020-10-23 | Discharge: 2020-10-23 | Disposition: A | Discharge status: Home / Self Care | Payer: Medicare HMO | Source: Ambulatory Visit | Attending: Dentistry | Admitting: Dentistry

## 2020-10-23 ENCOUNTER — Encounter (HOSPITAL_COMMUNITY): Payer: Self-pay

## 2020-10-23 ENCOUNTER — Other Ambulatory Visit (HOSPITAL_COMMUNITY)
Admission: RE | Admit: 2020-10-23 | Discharge: 2020-10-23 | Disposition: A | Discharge status: Home / Self Care | Payer: Medicare HMO | Source: Ambulatory Visit | Attending: Dentistry | Admitting: Dentistry

## 2020-10-23 DIAGNOSIS — Z923 Personal history of irradiation: Secondary | ICD-10-CM | POA: Insufficient documentation

## 2020-10-23 DIAGNOSIS — Z9002 Acquired absence of larynx: Secondary | ICD-10-CM | POA: Insufficient documentation

## 2020-10-23 DIAGNOSIS — Z01812 Encounter for preprocedural laboratory examination: Secondary | ICD-10-CM | POA: Insufficient documentation

## 2020-10-23 DIAGNOSIS — R918 Other nonspecific abnormal finding of lung field: Secondary | ICD-10-CM | POA: Diagnosis not present

## 2020-10-23 DIAGNOSIS — Z930 Tracheostomy status: Secondary | ICD-10-CM | POA: Diagnosis not present

## 2020-10-23 DIAGNOSIS — R590 Localized enlarged lymph nodes: Secondary | ICD-10-CM | POA: Insufficient documentation

## 2020-10-23 DIAGNOSIS — R9389 Abnormal findings on diagnostic imaging of other specified body structures: Secondary | ICD-10-CM | POA: Insufficient documentation

## 2020-10-23 DIAGNOSIS — Z9049 Acquired absence of other specified parts of digestive tract: Secondary | ICD-10-CM | POA: Diagnosis not present

## 2020-10-23 DIAGNOSIS — Z20822 Contact with and (suspected) exposure to covid-19: Secondary | ICD-10-CM | POA: Insufficient documentation

## 2020-10-23 DIAGNOSIS — E890 Postprocedural hypothyroidism: Secondary | ICD-10-CM | POA: Diagnosis not present

## 2020-10-23 LAB — SARS CORONAVIRUS 2 (TAT 6-24 HRS): SARS Coronavirus 2: NEGATIVE

## 2020-10-23 NOTE — Progress Notes (Signed)
PCP - Dr. Daiva Eves Cardiologist - denies  Chest x-ray - n/a EKG - n/a Stress Test -denies  ECHO - 11/05/19 Cardiac Cath - denies  Sleep Study - denies CPAP - denies  Blood Thinner Instructions: n/a Aspirin Instructions: n/a  COVID TEST- 10/23/20-pt aware to quarantine after testing.    Anesthesia review: Yes, pt s/p tracheostomy of 2 years. No current issues and no oxygen requirements. Lurline Idol is cuffless with no inner cannula. Brand is [employer]-ATOS HME Cap System. Pt instructed to bring supplies to surgery, pt agreed. Lab work was not collected in [employer] d/t recent labs done at the [facility] on 10/20/20. Results in Epic. Pt is non-verbal and communicates through partner or writing down what she needs to say. Pt does not know sign language.   Patient denies shortness of breath, fever, cough and chest pain at PAT appointment   All instructions explained to the patient, with a verbal understanding of the material. Patient agrees to go over the instructions while at home for a better understanding. Patient also instructed to self quarantine after being tested for COVID-19. The opportunity to ask questions was provided.

## 2020-10-24 NOTE — Progress Notes (Signed)
Anesthesia Chart Review:  History of laryngeal SCC s/p Salvage total laryngectomy, total glossectomy, total pharyngectomy, total thyroidectomy, and cervical esophagectomy on 07/20/19.  She is followed by ENT at [facility]. At preadmission appointment she denied any issues with her tracheostomy.  She is using an [employer] system. She has a humidification cap that is replaced daily when cleaning her larry tube. She will bring all supplies. She has previously been intubated through her stoma without issue.  Follows with oncologist Dr. Alvy Bimler. She is s/p chemo and radiation therapy. She did have recent local-regional recurrence. Per ENT note 11/08/19, "PET scan unfortunately showed a peripherally FDG avid mass in the left lateral pharynx at the deep side of the reconstruction as well as a right-sided lymph node. There are small nodule in the upper lung fields." She continues on immunotherapy with Keytruda indefinitely due to this recurrence.   Preop labs reviewed, WNL.  CT chest 09/07/2020: IMPRESSION: 1. Mild decrease in size of juxta cardiac metastatic involving the right ventricle. 2. Small pulmonary nodules are unchanged from previous exam. 3. Aortic atherosclerosis. 4. Gallstone.   PET head and neck 11/04/19: 1. Hypermetabolic bilateral neck lesions as detailed above are concerning for disease recurrence.  2. Compared to 07/08/2019 PET scan, interval development of subcentimeter bilateral upper lobe pulmonary nodules, size below PET resolution. These are concerning for metastasis. Consider diagnostic quality CT chest exam for further evaluation as per oncology treatment planning.  3. Focal hypermetabolic uptake in right ventricular cavity of indeterminate significance. Recommend echocardiographic examination to rule out underlying thrombus. This finding was discussed with Dr. Conley Canal by Dr. Clementeen Graham and Dr. Marcello Moores on 11/04/2019 at approximately 3:00 PM via telephone.  4. Ancillary CT  findings as above.  TTE 11/05/19 (care everywhere): Summary  1. The left ventricle is normal in size with normal wall thickness.  2. The left ventricular systolic function is normal, LVEF is visually  estimated at 60-65%.  3. The right ventricle is normal in size, with normal systolic function.  4. There is an extrinsic compression of the RV and RA laterally at the AV  groove by a mass of 4.3 cm diameter. Unclear if the mass is invasive, although  no pericardial effusion seen suggestive no invasion possibly. Thereis no  evidence of hemodynamic compromise secondary to cardiac compression with  normal IVC diameter.     Wynonia Musty [facility] Short Stay Center/Anesthesiology Phone [phone] 10/24/2020 12:22 PM

## 2020-10-24 NOTE — Anesthesia Preprocedure Evaluation (Signed)
Anesthesia Evaluation  Patient identified by MRN, date of birth, ID band Patient awake    Reviewed: Allergy & Precautions, NPO status , Patient's Chart, lab work & pertinent test results  History of Anesthesia Complications Negative for: history of anesthetic complications  Airway       Comment: S/p laryngectomy Dental  (+) Poor Dentition   Pulmonary neg pulmonary ROS, former smoker,    Pulmonary exam normal        Cardiovascular negative cardio ROS Normal cardiovascular exam     Neuro/Psych negative neurological ROS  negative psych ROS   GI/Hepatic negative GI ROS, Neg liver ROS,   Endo/Other  Hypothyroidism   Renal/GU negative Renal ROS  negative genitourinary   Musculoskeletal negative musculoskeletal ROS (+)   Abdominal   Peds  Hematology negative hematology ROS (+)   Anesthesia Other Findings  Laryngeal cancer s/p laryngectomy with local recurrence  Reproductive/Obstetrics                            Anesthesia Physical Anesthesia Plan  ASA: III  Anesthesia Plan: General   Post-op Pain Management:    Induction: Intravenous  PONV Risk Score and Plan: 3 and Ondansetron, Dexamethasone, Treatment may vary due to age or medical condition and Midazolam  Airway Management Planned:   Additional Equipment: None  Intra-op Plan:   Post-operative Plan: Extubation in OR  Informed Consent: I have reviewed the patients History and Physical, chart, labs and discussed the procedure including the risks, benefits and alternatives for the proposed anesthesia with the patient or authorized representative who has indicated his/her understanding and acceptance.       Plan Discussed with:   Anesthesia Plan Comments: (Wire reinforced ETT via laryngectomy stoma  PAT note by Karoline Caldwell, PA-C: History of laryngeal SCC s/p Salvage total laryngectomy, total glossectomy, total pharyngectomy,  total thyroidectomy, and cervical esophagectomy on 07/20/19.  She is followed by ENT at [facility]. At preadmission appointment she denied any issues with her tracheostomy.  She is using an [employer] system. She has a humidification cap that is replaced daily when cleaning her larry tube. She will bring all supplies. She has previously been intubated through her stoma without issue.  Follows with oncologist Dr. Alvy Bimler. She is s/p chemo and radiation therapy. She did have recent local-regional recurrence. Per ENT note 11/08/19, "PET scan unfortunately showed a peripherally FDG avid mass in the left lateral pharynx at the deep side of the reconstruction as well as a right-sided lymph node. There are small nodule in the upper lung fields." She continues on immunotherapy with Keytruda indefinitely due to this recurrence.   Preop labs reviewed, WNL.  CT chest 09/07/2020: IMPRESSION: 1. Mild decrease in size of juxta cardiac metastatic involving the right ventricle. 2. Small pulmonary nodules are unchanged from previous exam. 3. Aortic atherosclerosis. 4. Gallstone.   PET head and neck 11/04/19: 1. Hypermetabolic bilateral neck lesions as detailed above are concerning for disease recurrence.  2. Compared to 07/08/2019 PET scan, interval development of subcentimeter bilateral upper lobe pulmonary nodules, size below PET resolution. These are concerning for metastasis. Consider diagnostic quality CT chest exam for further evaluation as per oncology treatment planning.  3. Focal hypermetabolic uptake in right ventricular cavity of indeterminate significance. Recommend echocardiographic examination to rule out underlying thrombus. This finding was discussed with Dr. Conley Canal by Dr. Clementeen Graham and Dr. Marcello Moores on 11/04/2019 at approximately 3:00 PM via telephone.  4. Ancillary CT  findings as above.  TTE 11/05/19 (care everywhere): Summary  1. The left ventricle is normal in size with normal wall  thickness.  2. The left ventricular systolic function is normal, LVEF is visually  estimated at 60-65%.  3. The right ventricle is normal in size, with normal systolic function.  4. There is an extrinsic compression of the RV and RA laterally at the AV  groove by a mass of 4.3 cm diameter. Unclear if the mass is invasive, although  no pericardial effusion seen suggestive no invasion possibly. Thereis no  evidence of hemodynamic compromise secondary to cardiac compression with  normal IVC diameter.    )       Anesthesia Quick Evaluation

## 2020-10-26 ENCOUNTER — Ambulatory Visit (HOSPITAL_COMMUNITY)
Admission: RE | Admit: 2020-10-26 | Discharge: 2020-10-26 | Disposition: A | Discharge status: Home / Self Care | Payer: Medicare HMO | Attending: Dentistry | Admitting: Dentistry

## 2020-10-26 ENCOUNTER — Ambulatory Visit (HOSPITAL_COMMUNITY): Payer: Medicare HMO | Admitting: Physician Assistant

## 2020-10-26 ENCOUNTER — Other Ambulatory Visit: Payer: Self-pay

## 2020-10-26 ENCOUNTER — Encounter (HOSPITAL_COMMUNITY): Payer: Self-pay | Admitting: Dentistry

## 2020-10-26 ENCOUNTER — Encounter (HOSPITAL_COMMUNITY)
Admission: RE | Disposition: A | Discharge status: Home / Self Care | Payer: Self-pay | Source: Home / Self Care | Attending: Dentistry

## 2020-10-26 DIAGNOSIS — C321 Malignant neoplasm of supraglottis: Secondary | ICD-10-CM | POA: Insufficient documentation

## 2020-10-26 DIAGNOSIS — I252 Old myocardial infarction: Secondary | ICD-10-CM | POA: Insufficient documentation

## 2020-10-26 DIAGNOSIS — K045 Chronic apical periodontitis: Secondary | ICD-10-CM

## 2020-10-26 DIAGNOSIS — E039 Hypothyroidism, unspecified: Secondary | ICD-10-CM | POA: Diagnosis not present

## 2020-10-26 DIAGNOSIS — Z930 Tracheostomy status: Secondary | ICD-10-CM | POA: Insufficient documentation

## 2020-10-26 DIAGNOSIS — K0889 Other specified disorders of teeth and supporting structures: Secondary | ICD-10-CM | POA: Diagnosis not present

## 2020-10-26 DIAGNOSIS — E46 Unspecified protein-calorie malnutrition: Secondary | ICD-10-CM | POA: Diagnosis not present

## 2020-10-26 DIAGNOSIS — K056 Periodontal disease, unspecified: Secondary | ICD-10-CM | POA: Diagnosis not present

## 2020-10-26 DIAGNOSIS — R252 Cramp and spasm: Secondary | ICD-10-CM

## 2020-10-26 DIAGNOSIS — Z9221 Personal history of antineoplastic chemotherapy: Secondary | ICD-10-CM | POA: Diagnosis not present

## 2020-10-26 DIAGNOSIS — K029 Dental caries, unspecified: Secondary | ICD-10-CM | POA: Insufficient documentation

## 2020-10-26 DIAGNOSIS — E890 Postprocedural hypothyroidism: Secondary | ICD-10-CM | POA: Diagnosis not present

## 2020-10-26 DIAGNOSIS — Z923 Personal history of irradiation: Secondary | ICD-10-CM | POA: Insufficient documentation

## 2020-10-26 DIAGNOSIS — G893 Neoplasm related pain (acute) (chronic): Secondary | ICD-10-CM | POA: Insufficient documentation

## 2020-10-26 DIAGNOSIS — E871 Hypo-osmolality and hyponatremia: Secondary | ICD-10-CM | POA: Diagnosis not present

## 2020-10-26 DIAGNOSIS — Z6825 Body mass index (BMI) 25.0-25.9, adult: Secondary | ICD-10-CM | POA: Diagnosis not present

## 2020-10-26 DIAGNOSIS — Z79899 Other long term (current) drug therapy: Secondary | ICD-10-CM | POA: Insufficient documentation

## 2020-10-26 DIAGNOSIS — Z9049 Acquired absence of other specified parts of digestive tract: Secondary | ICD-10-CM | POA: Diagnosis not present

## 2020-10-26 DIAGNOSIS — B370 Candidal stomatitis: Secondary | ICD-10-CM | POA: Insufficient documentation

## 2020-10-26 DIAGNOSIS — Z9889 Other specified postprocedural states: Secondary | ICD-10-CM | POA: Diagnosis not present

## 2020-10-26 DIAGNOSIS — K0530 Chronic periodontitis, unspecified: Secondary | ICD-10-CM

## 2020-10-26 DIAGNOSIS — Z9002 Acquired absence of larynx: Secondary | ICD-10-CM | POA: Insufficient documentation

## 2020-10-26 DIAGNOSIS — K08409 Partial loss of teeth, unspecified cause, unspecified class: Secondary | ICD-10-CM

## 2020-10-26 HISTORY — PX: MULTIPLE EXTRACTIONS WITH ALVEOLOPLASTY: SHX5342

## 2020-10-26 SURGERY — MULTIPLE EXTRACTION WITH ALVEOLOPLASTY
Anesthesia: General | Site: Mouth

## 2020-10-26 MED ORDER — LIDOCAINE 2% (20 MG/ML) 5 ML SYRINGE
INTRAMUSCULAR | Status: DC | PRN
  Administered 2020-10-26: 100 mg via INTRAVENOUS

## 2020-10-26 MED ORDER — BUPIVACAINE-EPINEPHRINE (PF) 0.5% -1:200000 IJ SOLN
INTRAMUSCULAR | Status: AC
  Filled 2020-10-26: qty 3.6

## 2020-10-26 MED ORDER — LIDOCAINE-EPINEPHRINE 2 %-1:100000 IJ SOLN
INTRAMUSCULAR | Status: AC
  Filled 2020-10-26: qty 10.2

## 2020-10-26 MED ORDER — OXYCODONE HCL 5 MG PO TABS: 5.0000 mg | ORAL_TABLET | Freq: Once | ORAL | Status: DC | PRN

## 2020-10-26 MED ORDER — AMOXICILLIN 250 MG/5ML PO SUSR: 250.0000 mg | Freq: Two times a day (BID) | ORAL | 0 refills | Status: AC

## 2020-10-26 MED ORDER — 0.9 % SODIUM CHLORIDE (POUR BTL) OPTIME
TOPICAL | Status: DC | PRN
  Administered 2020-10-26: 1000 mL

## 2020-10-26 MED ORDER — ONDANSETRON HCL 4 MG/2ML IJ SOLN: 4.0000 mg | Freq: Once | INTRAMUSCULAR | Status: DC | PRN

## 2020-10-26 MED ORDER — OXYMETAZOLINE HCL 0.05 % NA SOLN
NASAL | Status: AC
  Filled 2020-10-26: qty 30

## 2020-10-26 MED ORDER — LACTATED RINGERS IV SOLN: INTRAVENOUS | Status: DC | PRN

## 2020-10-26 MED ORDER — MIDAZOLAM HCL 2 MG/2ML IJ SOLN
INTRAMUSCULAR | Status: DC | PRN
  Administered 2020-10-26: 2 mg via INTRAVENOUS

## 2020-10-26 MED ORDER — AMISULPRIDE (ANTIEMETIC) 5 MG/2ML IV SOLN: 10.0000 mg | Freq: Once | INTRAVENOUS | Status: DC | PRN

## 2020-10-26 MED ORDER — OXYCODONE HCL 5 MG/5ML PO SOLN: 5.0000 mg | Freq: Once | ORAL | Status: DC | PRN

## 2020-10-26 MED ORDER — DEXAMETHASONE SODIUM PHOSPHATE 10 MG/ML IJ SOLN
INTRAMUSCULAR | Status: AC
  Filled 2020-10-26: qty 1

## 2020-10-26 MED ORDER — FENTANYL CITRATE (PF) 250 MCG/5ML IJ SOLN
INTRAMUSCULAR | Status: AC
  Filled 2020-10-26: qty 5

## 2020-10-26 MED ORDER — CEFAZOLIN SODIUM-DEXTROSE 2-4 GM/100ML-% IV SOLN
2.0000 g | INTRAVENOUS | Status: AC
  Administered 2020-10-26: 2 g via INTRAVENOUS

## 2020-10-26 MED ORDER — PROPOFOL 10 MG/ML IV BOLUS
INTRAVENOUS | Status: DC | PRN
  Administered 2020-10-26: 20 mg via INTRAVENOUS
  Administered 2020-10-26: 30 mg via INTRAVENOUS
  Administered 2020-10-26: 60 mg via INTRAVENOUS
  Administered 2020-10-26: 30 mg via INTRAVENOUS

## 2020-10-26 MED ORDER — LIDOCAINE-EPINEPHRINE 1 %-1:100000 IJ SOLN
INTRAMUSCULAR | Status: AC
  Filled 2020-10-26: qty 1

## 2020-10-26 MED ORDER — ONDANSETRON HCL 4 MG/2ML IJ SOLN
INTRAMUSCULAR | Status: AC
  Filled 2020-10-26: qty 2

## 2020-10-26 MED ORDER — FENTANYL CITRATE (PF) 100 MCG/2ML IJ SOLN
25.0000 ug | INTRAMUSCULAR | Status: DC | PRN
  Administered 2020-10-26: 25 ug via INTRAVENOUS

## 2020-10-26 MED ORDER — PROPOFOL 10 MG/ML IV BOLUS
INTRAVENOUS | Status: AC
  Filled 2020-10-26: qty 40

## 2020-10-26 MED ORDER — LIDOCAINE-EPINEPHRINE 2 %-1:100000 IJ SOLN
INTRAMUSCULAR | Status: DC | PRN
  Administered 2020-10-26 (×3): 1.7 mL

## 2020-10-26 MED ORDER — CEFAZOLIN SODIUM-DEXTROSE 2-4 GM/100ML-% IV SOLN
INTRAVENOUS | Status: AC
  Filled 2020-10-26: qty 100

## 2020-10-26 MED ORDER — PHENYLEPHRINE 40 MCG/ML (10ML) SYRINGE FOR IV PUSH (FOR BLOOD PRESSURE SUPPORT)
PREFILLED_SYRINGE | INTRAVENOUS | Status: DC | PRN
  Administered 2020-10-26: 120 ug via INTRAVENOUS
  Administered 2020-10-26: 80 ug via INTRAVENOUS
  Administered 2020-10-26 (×3): 120 ug via INTRAVENOUS
  Administered 2020-10-26: 80 ug via INTRAVENOUS
  Administered 2020-10-26 (×2): 120 ug via INTRAVENOUS

## 2020-10-26 MED ORDER — PHENYLEPHRINE 40 MCG/ML (10ML) SYRINGE FOR IV PUSH (FOR BLOOD PRESSURE SUPPORT)
PREFILLED_SYRINGE | INTRAVENOUS | Status: AC
  Filled 2020-10-26: qty 20

## 2020-10-26 MED ORDER — MIDAZOLAM HCL 2 MG/2ML IJ SOLN
INTRAMUSCULAR | Status: AC
  Filled 2020-10-26: qty 2

## 2020-10-26 MED ORDER — BUPIVACAINE-EPINEPHRINE 0.5% -1:200000 IJ SOLN
INTRAMUSCULAR | Status: DC | PRN
  Administered 2020-10-26 (×2): 1.8 mL

## 2020-10-26 MED ORDER — THROMBIN 5000 UNITS EX SOLR
CUTANEOUS | Status: AC
  Filled 2020-10-26: qty 5000

## 2020-10-26 MED ORDER — FENTANYL CITRATE (PF) 250 MCG/5ML IJ SOLN
INTRAMUSCULAR | Status: DC | PRN
  Administered 2020-10-26 (×2): 50 ug via INTRAVENOUS

## 2020-10-26 MED ORDER — ONDANSETRON HCL 4 MG/2ML IJ SOLN
INTRAMUSCULAR | Status: DC | PRN
  Administered 2020-10-26: 4 mg via INTRAVENOUS

## 2020-10-26 MED ORDER — CHLORHEXIDINE GLUCONATE 0.12 % MT SOLN
OROMUCOSAL | Status: AC
  Administered 2020-10-26: 15 mL
  Filled 2020-10-26: qty 15

## 2020-10-26 MED ORDER — FENTANYL CITRATE (PF) 100 MCG/2ML IJ SOLN
INTRAMUSCULAR | Status: AC
  Filled 2020-10-26: qty 2

## 2020-10-26 MED ORDER — DEXAMETHASONE SODIUM PHOSPHATE 10 MG/ML IJ SOLN
INTRAMUSCULAR | Status: DC | PRN
  Administered 2020-10-26: 10 mg via INTRAVENOUS

## 2020-10-26 MED ORDER — LIDOCAINE 2% (20 MG/ML) 5 ML SYRINGE
INTRAMUSCULAR | Status: AC
  Filled 2020-10-26: qty 5

## 2020-10-26 SURGICAL SUPPLY — 32 items
ALCOHOL 70% 16 OZ (MISCELLANEOUS) ×2 IMPLANT
BLADE SURG 15 STRL LF DISP TIS (BLADE) ×1 IMPLANT
BLADE SURG 15 STRL SS (BLADE) ×2
COVER SURGICAL LIGHT HANDLE (MISCELLANEOUS) ×2 IMPLANT
COVER WAND RF STERILE (DRAPES) ×1 IMPLANT
GAUZE 4X4 16PLY RFD (DISPOSABLE) ×1 IMPLANT
GAUZE PACKING FOLDED 2  STR (GAUZE/BANDAGES/DRESSINGS) ×2
GAUZE PACKING FOLDED 2 STR (GAUZE/BANDAGES/DRESSINGS) ×1 IMPLANT
GLOVE BIO SURGEON STRL SZ 6.5 (GLOVE) ×3 IMPLANT
GLOVE SURG SS PI 6.0 STRL IVOR (GLOVE) ×1 IMPLANT
GOWN STRL REUS W/ TWL LRG LVL3 (GOWN DISPOSABLE) ×2 IMPLANT
GOWN STRL REUS W/TWL LRG LVL3 (GOWN DISPOSABLE) ×4
KIT BASIN OR (CUSTOM PROCEDURE TRAY) ×2 IMPLANT
KIT TURNOVER KIT B (KITS) ×2 IMPLANT
MANIFOLD NEPTUNE II (INSTRUMENTS) ×1 IMPLANT
NS IRRIG 1000ML POUR BTL (IV SOLUTION) ×2 IMPLANT
PACK EENT II TURBAN DRAPE (CUSTOM PROCEDURE TRAY) ×2 IMPLANT
PAD ARMBOARD 7.5X6 YLW CONV (MISCELLANEOUS) ×2 IMPLANT
SPONGE SURGIFOAM ABS GEL 100 (HEMOSTASIS) IMPLANT
SPONGE SURGIFOAM ABS GEL 12-7 (HEMOSTASIS) IMPLANT
SPONGE SURGIFOAM ABS GEL SZ50 (HEMOSTASIS) ×1 IMPLANT
SUCTION FRAZIER HANDLE 10FR (MISCELLANEOUS)
SUCTION TUBE FRAZIER 10FR DISP (MISCELLANEOUS) ×1 IMPLANT
SUT CHROMIC 3 0 PS 2 (SUTURE) ×4 IMPLANT
SUT CHROMIC 4 0 P 3 18 (SUTURE) IMPLANT
SYR 50ML SLIP (SYRINGE) ×1 IMPLANT
SYR BULB IRRIG 60ML STRL (SYRINGE) ×2 IMPLANT
TOWEL GREEN STERILE FF (TOWEL DISPOSABLE) ×2 IMPLANT
TUBE CONNECTING 12X1/4 (SUCTIONS) ×1 IMPLANT
WATER STERILE IRR 1000ML POUR (IV SOLUTION) ×2 IMPLANT
WATER TABLETS ICX (MISCELLANEOUS) ×2 IMPLANT
YANKAUER SUCT BULB TIP NO VENT (SUCTIONS) ×2 IMPLANT

## 2020-10-26 NOTE — Transfer of Care (Signed)
Immediate Anesthesia Transfer of Care Note  Patient: [name] [name]  Procedure(s) Performed: MULTIPLE EXTRACTION WITH ALVEOLOPLASTY (N/A Mouth)  Patient Location: PACU  Anesthesia Type:General  Level of Consciousness: awake, alert  and drowsy  Airway & Oxygen Therapy: Patient Spontanous Breathing  Post-op Assessment: Report given to RN, Post -op Vital signs reviewed and stable and Patient moving all extremities  Post vital signs: Reviewed and stable  Last Vitals:  Vitals Value Taken Time  BP 116/66 10/26/20 0907  Temp    Pulse 89 10/26/20 0907  Resp 22 10/26/20 0907  SpO2 94 % 10/26/20 0907  Vitals shown include unvalidated device data.  Last Pain:  Vitals:   10/26/20 9311  TempSrc:   PainSc: 6       Patients Stated Pain Goal: 6 (21/62/44 6950)  Complications: No complications documented.

## 2020-10-26 NOTE — Interval H&P Note (Signed)
History and Physical Interval Note:  10/26/2020 7:18 AM  [name] [name]  has presented today for surgery, with the diagnosis of DENTAL CARIES PERIDONTAL DISEASE.  The various methods of treatment have been discussed with the patient and family. After consideration of risks, benefits and other options for treatment, the patient has consented to  Procedure(s): MULTIPLE EXTRACTION WITH ALVEOLOPLASTY (N/A) as a surgical intervention.  The patient's history has been reviewed, patient examined, no change in status, stable for surgery.  I have reviewed the patient's chart and labs.  Questions were answered to the patient's satisfaction.     Charlaine Dalton

## 2020-10-26 NOTE — Anesthesia Postprocedure Evaluation (Signed)
Anesthesia Post Note  Patient: BOBBE QUILTER  Procedure(s) Performed: MULTIPLE EXTRACTION WITH ALVEOLOPLASTY (N/A Mouth)     Patient location during evaluation: PACU Anesthesia Type: General Level of consciousness: awake and alert Pain management: pain level controlled Vital Signs Assessment: post-procedure vital signs reviewed and stable Respiratory status: spontaneous breathing, nonlabored ventilation and respiratory function stable Cardiovascular status: blood pressure returned to baseline and stable Postop Assessment: no apparent nausea or vomiting Anesthetic complications: no   No complications documented.  Last Vitals:  Vitals:   10/26/20 0922 10/26/20 0932  BP: 118/70 108/60  Pulse: 88 84  Resp: 15 16  Temp:    SpO2: 95% 97%    Last Pain:  Vitals:   10/26/20 0907  TempSrc: Temporal  PainSc:                  Lidia Collum

## 2020-10-26 NOTE — Discharge Instructions (Signed)
[facility] of [facility] Medicine Dr. Debe Coder B. Benson Norway, DMD Phone: [phone] Fax: [phone]    MOUTH CARE AFTER SURGERY   FACTS:  Ice used in ice bag helps keep the swelling down, and can help lessen the pain.  It is easier to treat pain BEFORE it happens.  Spitting disturbs the clot and may cause bleeding to start again, or to get worse.  Smoking delays healing and can cause complications.  Sharing prescriptions can be dangerous.  Do not take medications not recently prescribed for you.  Antibiotics may stop birth control pills from working.  Use other means of birth control while on antibiotics.  Warm salt water rinses after the first 24 hours will help lessen the swelling:  Use 1/2 teaspoonful of table salt per oz.of water.  DO NOT:  Do not spit.    Do not drink through a straw.  Strongly advised not to smoke, dip snuff or chew tobacco at least for 3 days.  Do not eat sharp or crunchy foods.  Avoid the area of surgery when chewing.  Do not stop your antibiotics before your instructions say to do so.  Do not eat hot foods until bleeding has stopped.  If you need to, let your food cool down to room temperature.  EXPECT:  Some swelling, especially first 2-3 days.  Soreness or discomfort in varying degrees.  Follow your dentist's instructions about how to handle pain before it starts.  Pinkish saliva or light blood in saliva, or on your pillow in the morning.  This can last around 24 hours.  Bruising inside or outside the mouth.  This may not show up until 2-3 days after surgery.  Don't worry, it will go away in time.  Pieces of "bone" may work themselves loose.  It's OK.  If they bother you, let us know.  WHAT TO DO IMMEDIATELY AFTER SURGERY:  Bite on gauze with steady pressure for 30-45 minutes at a time.  Switch out the gauze after 30-45 minutes for clean gauze, and continue this for 1-2 hours or until bleeding subsides.  Do not chew on the  gauze.  Do not lie down flat.  Raise your head support especially for the first 24 hours.  Apply ice to your face on the side of the surgery.  You may apply it 20 minutes on and a few minutes off.  Ice for 8-12 hours.  You may use ice up to 24 hours.  Before the numbness wears off, take a pain pill as instructed.  Prescription pain medication is not always required.  SWELLING:  Expect swelling for the first couple of days.  It should get better after that.  If swelling increases 3 days or so after surgery, let us know as soon as possible.  FEVER:  Take Tylenol every 4 hours if needed to lower your temperature, especially if it is at 100F or higher.  Drink lots of fluids.  If the fever does not go away, let us know.  BREATHING TROUBLE:  Any unusual difficulty breathing means you have to have someone bring you to the emergency room ASAP.  BLEEDING:  Light oozing is expected for 24 hours or so.  Prop head up with pillows.  Do not spit.  Do not confuse bright red fresh flowing blood with lots of saliva colored with a little bit of blood.  If you notice some bleeding, place gauze or a tea bag where it is bleeding and apply CONSTANT pressure by biting  down for 1 hour.  Avoid talking during this time.  Do not remove the gauze or tea bag during this hour to "check" the bleeding.  If you notice bright RED bleeding FLOWING out of particular area, and filling the floor of your mouth, put a wad of gauze on that area, bite down firmly and constantly.  Call us immediately.  If we're closed, have someone bring you to the emergency room.  ORAL HYGIENE:  Brush your teeth as usual after meals and before bedtime.  Use a soft toothbrush around the area of surgery.  DO NOT AVOID BRUSHING.  Otherwise bacteria(germs) will grow and may delay healing or encourage infection.  Since you cannot spit, just gently rinse and let the water flow out of your mouth.  DO NOT SWISH  HARD.  EATING:  Cool liquids are a good point to start.  Increase to soft foods as tolerated.  PRESCRIPTIONS:  Follow the directions for your prescriptions exactly as written.  If your doctor gave you a narcotic pain medication, do not drive, operate machinery or drink alcohol when on that medication.  QUESTIONS:  Call our office during office hours [phone] or call the Emergency Room at [phone].

## 2020-10-26 NOTE — Anesthesia Procedure Notes (Signed)
Procedure Name: Intubation Date/Time: 10/26/2020 7:37 AM Performed by: Rande Brunt, CRNA Pre-anesthesia Checklist: Patient identified, Emergency Drugs available, Suction available and Patient being monitored Patient Re-evaluated:Patient Re-evaluated prior to induction Oxygen Delivery Method: Circle System Utilized Preoxygenation: Pre-oxygenation with 100% oxygen Induction Type: IV induction Ventilation: Mask ventilation without difficulty Tube type: Oral Tube size: 6.0 mm Number of attempts: 1 Airway Equipment and Method: Stylet and Oral airway Placement Confirmation: ETT inserted through vocal cords under direct vision,  positive ETCO2 and breath sounds checked- equal and bilateral Tube secured with: Tape Dental Injury: Teeth and Oropharynx as per pre-operative assessment  Comments: Used a 6.0 reinforced tube

## 2020-10-26 NOTE — Op Note (Addendum)
OPERATIVE NOTE  Patient Name:  [name] [name] Date of Birth:   06/01/1951 Medical Record Number: [id number]  DATE OF SURGERY:  10/26/2020  SURGEON: Amylee Lodato B. Benson Norway, DMD  ASSISTANT: [name], DAII  PREOPERATIVE DIAGNOSES: Patient Active Problem List   Diagnosis Date Noted  . Lung mass 09/05/2020  . Oral thrush 05/18/2020  . Poor dentition 05/18/2020  . Radiation-induced fibrosis of skin from therapeutic procedure 05/18/2020  . Preventive measure 05/18/2020  . Tracheostomy care ([place]) 03/30/2020  . Hypoparathyroidism ([place]) 02/09/2020  . Pancytopenia, acquired ([place]) 01/27/2020  . Deficiency anemia 01/27/2020  . Cancer associated pain 01/27/2020  . Alaryngeal voice 12/15/2019  . Goals of care, counseling/discussion 07/07/2019  . Tracheostomy dependence ([place]) 04/05/2019  . Hyponatremia 03/31/2019  . Anemia due to antineoplastic chemotherapy 03/03/2019  . Protein malnutrition ([place]) 03/03/2019  . Port-A-Cath in place 02/10/2019  . Laryngeal squamous cell carcinoma ([place]) 01/18/2019  . Acute respiratory failure with hypoxia and hypercapnia ([place]) 12/03/2018  . Hypothyroidism 12/03/2018  . Laryngeal mass 12/03/2018  . Type 2 MI (myocardial infarction) ([place]) 12/03/2018   Dental caries, severe periodontal disease  POSTOPERATIVE DIAGNOSES: Same  PROCEDURES PERFORMED: 1. Extractions of teeth numbers 3, 4, 6, 7, 8, 9, 10, 11, 12, 14, 20, 21, 22, 23, 24, 25, 26, 27, 28 and 29 2. 4 Quadrants of alveoloplasty (UR, UL, LR, LL)  ANESTHESIA: General anesthesia via tracheal tube.  MEDICATIONS: 1. Ancef 2 g IV prior to invasive dental procedures. 2. Local anesthesia with a total utilization of 3 cartridges of 34 mg of lidocaine with 0.018 mg of epinephrine/ea.  SPECIMENS: 20 teeth that were extracted and discarded.  DRAINS/CULTURES: None  COMPLICATIONS: None  ESTIMATED BLOOD LOSS: 15 mL  INTRAVENOUS FLUIDS: 10 mL of Lactated ringers solution.  INDICATIONS: The patient was  diagnosed with laryngeal cancer s/p chemoradiation.  A medically necessary dental consult was then requested to evaluate the patient for any infection that could compromise the patient's systemic health due to her compromised immune system.  The patient was examined and treatment planned for full mouth extractions with alveoloplasty as needed.  This treatment plan was formulated to decrease the risks and complications associated with dental infection from affecting the patient's systemic health and postoperative infection.  OPERATIVE FINDINGS: The patient was examined in operating room number 8.  The indicated teeth were identified for extraction. The patient was noted be affected by chronic severe periodontitis and severe dental decay.  DESCRIPTION OF PROCEDURE: Patient was identified in the holding area and brought to the main operating room number 8 by the anesthesia team. Patient was then placed in the supine position on the operating table. General anesthesia was then induced per the anesthesia team. The patient was then prepped and draped in the usual sterile fashion for dental medicine procedures. A timeout was performed. The patient was identified and procedures were verified. A throat pack was placed at this time. The oral cavity was then thoroughly examined with the findings noted above. The patient was then ready for dental medicine procedure as follows:  ROUTINE EXTRACTIONS: Local anesthesia was administered sequentially with a total utilization of 3 cartridges each containing 34 mg of lidocaine with 0.018 mg of epinephrine. Location of anesthesia included Maxillary and Mandibular infiltration, palatal, lingual, nasopalatine block and bilateral mental nerve blocks.  The Maxillary left and right quadrants first approached. The teeth were then subluxated with a series of straight elevators. Tooth numbers 3, 4, 6, 7, 8, 9, 10, 11, 12 and 14 were  then removed with a 150 forceps without  complications. Alveoloplasty was then performed utilizing a ronguers and bone file in the [place]. The surgical sites were then irrigated with copious amounts of sterile saline. The tissues were approximated and trimmed appropriately. Surgi Foam was placed in each extraction site. The surgical sites were all closed using 3-0 chromic gut sutures as follows: Continuous interlocking suture from the distal of #3 extraction site to the distal of #14 extraction site, and 1 simple interrupted suture placed over #3 and #14 extraction sites.  The Mandibular left and right quadrants were then approached. The teeth were subluxated with a series of straight elevators. Tooth numbers 20, 21, 22, 23, 24, 25, 26, 27, 28 and 29 were then removed utilizing a 151 forceps. Alveoloplasty was then performed in the LLQ and LRQ utilizing a rongeurs and bone file to help achieve primary closure. The tissues were approximated and trimmed appropriately. The surgical sites were then irrigated with copious amounts of sterile saline. Surgi Foam was placed in #20, #21 and #22 extraction sites. The surgical sites were all closed using 3-0 chromic gut sutures as follows: Continuous interlocking suture spanning from the distal of #20 extraction site to the distal of #29 extraction site.  END OF PROCEDURE: Thorough oral irrigation with sterile saline was performed. The patient was examined for complications, seeing none, the dental medicine procedure was deemed to be complete. The throat pack was removed at this time. An oral airway was then placed at the request of the anesthesia team. A series of 4 x 4 gauze were placed in the mouth to aid hemostasis as needed. The patient was then handed over to the anesthesia team for final disposition. After an appropriate amount of time, the patient was extubated and taken to the postanesthsia care unit in stable condition. All counts were correct for the dental medicine procedure.   [place]  Benson [place], DMD

## 2020-10-27 ENCOUNTER — Telehealth (HOSPITAL_COMMUNITY): Payer: Self-pay | Admitting: Dentistry

## 2020-10-27 ENCOUNTER — Encounter (HOSPITAL_COMMUNITY): Payer: Self-pay | Admitting: Dentistry

## 2020-10-27 NOTE — Telephone Encounter (Signed)
Called patient's home phone number and spoke with her partner [name] to see how Sandralee was doing after her dental surgery yesterday.  [name] said that she has been doing okay and is still a little sore, but her swelling and bleeding have subsided.  She is otherwise without any concerns or issues.    Rescheduled patient's postoperative appointment for 11/06/20, and instructed [name] to call our clinic should she or Krystian have any questions or concerns prior to this appointment.  [name] verbalized understanding and was appreciative of the phone call.  [place] Benson Norway, DMD

## 2020-11-02 ENCOUNTER — Ambulatory Visit (HOSPITAL_COMMUNITY): Payer: Medicare HMO | Admitting: Dentistry

## 2020-11-06 ENCOUNTER — Ambulatory Visit (HOSPITAL_COMMUNITY): Payer: Dental | Admitting: Dentistry

## 2020-11-06 ENCOUNTER — Other Ambulatory Visit: Payer: Self-pay

## 2020-11-06 VITALS — BP 121/68 | HR 74 | Temp 97.4°F

## 2020-11-06 DIAGNOSIS — K08199 Complete loss of teeth due to other specified cause, unspecified class: Secondary | ICD-10-CM

## 2020-11-06 NOTE — Progress Notes (Signed)
DENTAL VISIT  POSTOPERATIVE NOTE   Date:   11/06/2020 Patient Name:  [name] [name] MRN:   [id number]    COVID 19 SCREENING: The patient does not symptoms concerning for COVID-19 infection (Including fever, chills, cough, or new SHORTNESS OF BREATH).    VITALS: BP 121/68 (BP Location: Left Arm)   Pulse 74   Temp (!) 97.4 F (36.3 C) (Axillary)    CHIEF COMPLAINT: Patient reports doing well since her dental surgery on 2/3.  She says that her sutures have been coming out, but that is the only issue that has been bothering her.  . Patient presents today for a post-op visit s/p Full Mouth Extractions in the OR on 10/26/20. . Medical and dental history reviewed with the patient.  No changes reported.  Patient Active Problem List   Diagnosis Date Noted  . Lung mass 09/05/2020  . Oral thrush 05/18/2020  . Poor dentition 05/18/2020  . Radiation-induced fibrosis of skin from therapeutic procedure 05/18/2020  . Preventive measure 05/18/2020  . Tracheostomy care ([place]) 03/30/2020  . Hypoparathyroidism ([place]) 02/09/2020  . Pancytopenia, acquired ([place]) 01/27/2020  . Deficiency anemia 01/27/2020  . Cancer associated pain 01/27/2020  . Alaryngeal voice 12/15/2019  . Goals of care, counseling/discussion 07/07/2019  . Tracheostomy dependence ([place]) 04/05/2019  . Hyponatremia 03/31/2019  . Anemia due to antineoplastic chemotherapy 03/03/2019  . Protein malnutrition ([place]) 03/03/2019  . Port-A-Cath in place 02/10/2019  . Laryngeal squamous cell carcinoma ([place]) 01/18/2019  . Acute respiratory failure with hypoxia and hypercapnia ([place]) 12/03/2018  . Hypothyroidism 12/03/2018  . Laryngeal mass 12/03/2018  . Type 2 MI (myocardial infarction) ([place]) 12/03/2018   Past Medical History:  Diagnosis Date  . Cataract   . History of radiation therapy 02/11/19- 04/06/19   bilateral neck nodes and upper mediastinum  . Laryngeal cancer ([place]) 11/2018  . Thyroid disease    Past Surgical History:   Procedure Laterality Date  . CATARACT EXTRACTION, BILATERAL  left 05/20/16, right 06/03/16   Cataract Extraction w/ IOL Implant; Surgeon: Sudie Grumbling, MD; Location: [facility]; Service: Ophthalmology   . CESAREAN SECTION    . ESOPHAGOGASTRODUODENOSCOPY (EGD) WITH PROPOFOL N/A 02/01/2019   Procedure: ESOPHAGOGASTRODUODENOSCOPY (EGD) WITH PROPOFOL;  Surgeon: Milus Banister, MD;  Location: [facility];  Service: Endoscopy;  Laterality: N/A;  . EUS N/A 02/01/2019   Procedure: UPPER ENDOSCOPIC ULTRASOUND (EUS) RADIAL-Will need Linear and Forward Viewing EUS Scope;  Surgeon: Milus Banister, MD;  Location: [facility];  Service: Endoscopy;  Laterality: N/A;  . FINE NEEDLE ASPIRATION N/A 02/01/2019   Procedure: FINE NEEDLE ASPIRATION (FNA) LINEAR;  Surgeon: Milus Banister, MD;  Location: [facility];  Service: Endoscopy;  Laterality: N/A;  . IR GASTROSTOMY TUBE MOD SED  02/09/2019  . IR IMAGING GUIDED PORT INSERTION  02/02/2019  . IR PATIENT EVAL TECH 0-60 MINS  01/06/2020  . IR RADIOLOGIST EVAL & MGMT  04/10/2020  . KNEE CARTILAGE SURGERY Right 2019  . MULTIPLE EXTRACTIONS WITH ALVEOLOPLASTY N/A 10/26/2020   Procedure: MULTIPLE EXTRACTION WITH ALVEOLOPLASTY;  Surgeon: Charlaine Dalton, DMD;  Location: [facility];  Service: Dentistry;  Laterality: N/A;  . TRACHEOSTOMY  12/02/2018   [facility]   Current Outpatient Medications  Medication Sig Dispense Refill  . chlorhexidine (PERIDEX) 0.12 % solution Rinse with 15 mls twice daily for 30 seconds. Use after breakfast and at bedtime. Spit out excess. Do not swallow. (Patient taking differently: Use as directed 15 mLs in the mouth or throat 2 (two) times  daily. Rinse with 15 mls twice daily for 30 seconds. Use after breakfast and at bedtime. Spit out excess. Do not swallow.) 480 mL 3  . Cholecalciferol (VITAMIN D3) 125 MCG (5000 UT) CAPS Take 5,000 Units by mouth daily.    . fluconazole (DIFLUCAN) 100 MG tablet Take 1 tablet (100 mg total) by mouth  daily. 7 tablet 0  . HYDROcodone-acetaminophen (NORCO) 5-325 MG tablet Take 1 tablet by mouth every 6 (six) hours as needed for moderate pain. 60 tablet 0  . levothyroxine (SYNTHROID) 137 MCG tablet Take 137 mcg by mouth daily before breakfast.    . lidocaine-prilocaine (EMLA) cream Apply to affected area once (Patient taking differently: Apply 1 application topically daily as needed (port access). Apply to affected area once) 30 g 3  . morphine (MS CONTIN) 15 MG 12 hr tablet Take 15 mg by mouth at bedtime as needed for pain.    . Nutritional Supplements (KATE FARMS PEPTIDE 1.5) LIQD Take 4 Bottles by mouth daily. 325 mL 11  . ondansetron (ZOFRAN) 8 MG tablet Take 1 tablet (8 mg total) by mouth 2 (two) times daily as needed (Nausea or vomiting). 30 tablet 1  . prochlorperazine (COMPAZINE) 10 MG tablet Take 1 tablet (10 mg total) by mouth every 6 (six) hours as needed (Nausea or vomiting). 30 tablet 1  . sodium fluoride (PREVIDENT 5000 PLUS) 1.1 % CREA dental cream Apply cream to tooth brush. Brush teeth for 2 minutes. Spit out excess. DO NOT rinse afterwards. Repeat nightly. (Patient taking differently: Place 1 application onto teeth 2 (two) times a day.) 1 Tube prn   No current facility-administered medications for this visit.   No Known Allergies    LABS: Lab Results  Component Value Date   WBC 4.6 10/20/2020   HGB 13.4 10/20/2020   HCT 40.9 10/20/2020   MCV 89.3 10/20/2020   PLT 215 10/20/2020   BMET    Component Value Date/Time   NA 139 10/20/2020 0909   K 4.2 10/20/2020 0909   CL 103 10/20/2020 0909   CO2 28 10/20/2020 0909   GLUCOSE 98 10/20/2020 0909   BUN 23 10/20/2020 0909   CREATININE 0.77 10/20/2020 0909   CREATININE 0.60 02/17/2020 1128   CALCIUM 9.0 10/20/2020 0909   GFRNONAA >60 10/20/2020 0909   GFRNONAA >60 02/17/2020 1128   GFRAA >60 06/08/2020 1303   GFRAA >60 02/17/2020 1128    Lab Results  Component Value Date   INR 1.1 12/06/2019   INR 1.0 02/09/2019    INR 1.0 02/02/2019   No results found for: PTT   EXAM: Extraction sites appear to be healing WNL.  No signs of wound dehiscence or infection evident upon examination.  Few sutures do remain in-tact.   PROCEDURE: The patient was given a chlorhexidine gluconate rinse for 30 seconds. Remaining sutures were then removed without complication.  Extractions sites were irrigated with sterile saline.  Patient tolerated the procedure well.   ASSESSMENT: Postoperative course is consistent with dental procedures performed.   PLAN: 1. Patient is to return to our clinic in about 2 weeks for another follow-up visit.  Patient is to call should any questions or issues arise before then. 2. Patient to return to her regular dentist for routine dental care including periodic exams and replacement of missing teeth.  Recommend that she discuss plans to return to dentist for non-urgent treatment with MD to ensure they are medically optimized and to discuss any potential contraindications.  . All questions  and concerns were addressed, and the patient verbalized understanding of discussion and plan.   . Patient tolerated today's visit well and departed in stable condition.   Tyhee Benson [place], DMD

## 2020-11-07 DIAGNOSIS — C321 Malignant neoplasm of supraglottis: Secondary | ICD-10-CM | POA: Diagnosis not present

## 2020-11-07 DIAGNOSIS — R633 Feeding difficulties, unspecified: Secondary | ICD-10-CM | POA: Diagnosis not present

## 2020-11-09 DIAGNOSIS — Z430 Encounter for attention to tracheostomy: Secondary | ICD-10-CM | POA: Diagnosis not present

## 2020-11-10 ENCOUNTER — Other Ambulatory Visit: Payer: Self-pay

## 2020-11-10 ENCOUNTER — Inpatient Hospital Stay: Payer: Medicare HMO | Admitting: Hematology and Oncology

## 2020-11-10 ENCOUNTER — Inpatient Hospital Stay: Payer: Medicare HMO

## 2020-11-10 ENCOUNTER — Inpatient Hospital Stay: Payer: Medicare HMO | Attending: Hematology

## 2020-11-10 ENCOUNTER — Encounter: Payer: Self-pay | Admitting: Hematology and Oncology

## 2020-11-10 DIAGNOSIS — Z923 Personal history of irradiation: Secondary | ICD-10-CM | POA: Diagnosis not present

## 2020-11-10 DIAGNOSIS — B370 Candidal stomatitis: Secondary | ICD-10-CM | POA: Insufficient documentation

## 2020-11-10 DIAGNOSIS — C329 Malignant neoplasm of larynx, unspecified: Secondary | ICD-10-CM | POA: Diagnosis not present

## 2020-11-10 DIAGNOSIS — Z87891 Personal history of nicotine dependence: Secondary | ICD-10-CM | POA: Diagnosis not present

## 2020-11-10 DIAGNOSIS — Z95828 Presence of other vascular implants and grafts: Secondary | ICD-10-CM

## 2020-11-10 DIAGNOSIS — Z5112 Encounter for antineoplastic immunotherapy: Secondary | ICD-10-CM | POA: Insufficient documentation

## 2020-11-10 DIAGNOSIS — G893 Neoplasm related pain (acute) (chronic): Secondary | ICD-10-CM | POA: Insufficient documentation

## 2020-11-10 DIAGNOSIS — Z930 Tracheostomy status: Secondary | ICD-10-CM | POA: Diagnosis not present

## 2020-11-10 DIAGNOSIS — E039 Hypothyroidism, unspecified: Secondary | ICD-10-CM

## 2020-11-10 DIAGNOSIS — C321 Malignant neoplasm of supraglottis: Secondary | ICD-10-CM

## 2020-11-10 DIAGNOSIS — Z79899 Other long term (current) drug therapy: Secondary | ICD-10-CM | POA: Diagnosis not present

## 2020-11-10 LAB — CBC WITH DIFFERENTIAL/PLATELET
Abs Immature Granulocytes: 0.02 10*3/uL (ref 0.00–0.07)
Basophils Absolute: 0 10*3/uL (ref 0.0–0.1)
Basophils Relative: 0 %
Eosinophils Absolute: 0.2 10*3/uL (ref 0.0–0.5)
Eosinophils Relative: 5 %
HCT: 37.8 % (ref 36.0–46.0)
Hemoglobin: 12.4 g/dL (ref 12.0–15.0)
Immature Granulocytes: 0 %
Lymphocytes Relative: 24 %
Lymphs Abs: 1.2 10*3/uL (ref 0.7–4.0)
MCH: 29.7 pg (ref 26.0–34.0)
MCHC: 32.8 g/dL (ref 30.0–36.0)
MCV: 90.6 fL (ref 80.0–100.0)
Monocytes Absolute: 0.5 10*3/uL (ref 0.1–1.0)
Monocytes Relative: 11 %
Neutro Abs: 2.9 10*3/uL (ref 1.7–7.7)
Neutrophils Relative %: 60 %
Platelets: 243 10*3/uL (ref 150–400)
RBC: 4.17 MIL/uL (ref 3.87–5.11)
RDW: 12.8 % (ref 11.5–15.5)
WBC: 4.9 10*3/uL (ref 4.0–10.5)
nRBC: 0 % (ref 0.0–0.2)

## 2020-11-10 LAB — COMPREHENSIVE METABOLIC PANEL
ALT: 9 U/L (ref 0–44)
AST: 16 U/L (ref 15–41)
Albumin: 3.8 g/dL (ref 3.5–5.0)
Alkaline Phosphatase: 70 U/L (ref 38–126)
Anion gap: 10 (ref 5–15)
BUN: 26 mg/dL — ABNORMAL HIGH (ref 8–23)
CO2: 25 mmol/L (ref 22–32)
Calcium: 8.9 mg/dL (ref 8.9–10.3)
Chloride: 103 mmol/L (ref 98–111)
Creatinine, Ser: 0.8 mg/dL (ref 0.44–1.00)
GFR, Estimated: >60 mL/min (ref >60)
Glucose, Bld: 103 mg/dL — ABNORMAL HIGH (ref 70–99)
Potassium: 4.2 mmol/L (ref 3.5–5.1)
Sodium: 138 mmol/L (ref 135–145)
Total Bilirubin: 0.4 mg/dL (ref 0.3–1.2)
Total Protein: 7.3 g/dL (ref 6.5–8.1)

## 2020-11-10 LAB — TSH: TSH: 1.158 u[IU]/mL (ref 0.308–3.960)

## 2020-11-10 MED ORDER — FLUCONAZOLE 100 MG PO TABS: 100.0000 mg | ORAL_TABLET | Freq: Every day | ORAL | 0 refills | Status: DC

## 2020-11-10 MED ORDER — SODIUM CHLORIDE 0.9 % IV SOLN
Freq: Once | INTRAVENOUS | Status: AC
  Filled 2020-11-10: qty 250

## 2020-11-10 MED ORDER — SODIUM CHLORIDE 0.9 % IV SOLN
200.0000 mg | Freq: Once | INTRAVENOUS | Status: AC
  Administered 2020-11-10: 200 mg via INTRAVENOUS
  Filled 2020-11-10: qty 8

## 2020-11-10 MED ORDER — SODIUM CHLORIDE 0.9% FLUSH
10.0000 mL | INTRAVENOUS | Status: DC | PRN
  Administered 2020-11-10: 10 mL
  Filled 2020-11-10: qty 10

## 2020-11-10 MED ORDER — HYDROCODONE-ACETAMINOPHEN 5-325 MG PO TABS: 1.0000 | ORAL_TABLET | Freq: Four times a day (QID) | ORAL | 0 refills | Status: DC | PRN

## 2020-11-10 MED ORDER — CHLORHEXIDINE GLUCONATE 0.12 % MT SOLN: 15.0000 mL | Freq: Two times a day (BID) | OROMUCOSAL | 2 refills | Status: DC

## 2020-11-10 MED ORDER — HEPARIN SOD (PORK) LOCK FLUSH 100 UNIT/ML IV SOLN
500.0000 [IU] | Freq: Once | INTRAVENOUS | Status: AC | PRN
  Administered 2020-11-10: 500 [IU]
  Filled 2020-11-10: qty 5

## 2020-11-10 MED ORDER — SODIUM CHLORIDE 0.9% FLUSH
10.0000 mL | Freq: Once | INTRAVENOUS | Status: AC
  Administered 2020-11-10: 10 mL
  Filled 2020-11-10: qty 10

## 2020-11-10 NOTE — Patient Instructions (Signed)
[facility] Discharge Instructions for Patients Receiving Chemotherapy  Today you received the following chemotherapy agents: pembrolizumab.  To help prevent nausea and vomiting after your treatment, we encourage you to take your nausea medication as directed.   If you develop nausea and vomiting that is not controlled by your nausea medication, call the clinic.   BELOW ARE SYMPTOMS THAT SHOULD BE REPORTED IMMEDIATELY:  *FEVER GREATER THAN 100.5 F  *CHILLS WITH OR WITHOUT FEVER  NAUSEA AND VOMITING THAT IS NOT CONTROLLED WITH YOUR NAUSEA MEDICATION  *UNUSUAL SHORTNESS OF BREATH  *UNUSUAL BRUISING OR BLEEDING  TENDERNESS IN MOUTH AND THROAT WITH OR WITHOUT PRESENCE OF ULCERS  *URINARY PROBLEMS  *BOWEL PROBLEMS  UNUSUAL RASH Items with * indicate a potential emergency and should be followed up as soon as possible.  Feel free to call the clinic should you have any questions or concerns. The clinic phone number is [phone].  Please show the CHEMO ALERT CARD at check-in to the Emergency Department and triage nurse.   

## 2020-11-10 NOTE — Assessment & Plan Note (Signed)
Her pain is well controlled We discussed narcotic refill policy

## 2020-11-10 NOTE — Assessment & Plan Note (Signed)
Her last CT imaging show improved disease control We will continue immunotherapy indefinitely I plan to repeat imaging study again in a few months, due next around April 2022

## 2020-11-10 NOTE — Assessment & Plan Note (Signed)
The oral thrush has improved since her last visit I recommend 1 more cycle of fluconazole

## 2020-11-10 NOTE — Progress Notes (Signed)
[facility] OFFICE PROGRESS NOTE  Patient Care Team: Hamrick, Lorin Mercy, MD as PCP - General (Family Medicine)  ASSESSMENT & PLAN:  Laryngeal squamous cell carcinoma ([place]) Her last CT imaging show improved disease control We will continue immunotherapy indefinitely I plan to repeat imaging study again in a few months, due next around April 2022  Cancer associated pain Her pain is well controlled We discussed narcotic refill policy   Oral thrush The oral thrush has improved since her last visit I recommend 1 more cycle of fluconazole   No orders of the defined types were placed in this encounter.   All questions were answered. The patient knows to call the clinic with any problems, questions or concerns. The total time spent in the appointment was 20 minutes encounter with patients including review of chart and various tests results, discussions about plan of care and coordination of care plan   Heath Lark, MD 11/10/2020 9:40 AM  INTERVAL HISTORY: Please see below for problem oriented charting. She returns with her son for further follow-up She had complete dental extraction recently complicated by pain and mild weight loss but overall, she has less pain now No recent infection, fever or chills She is pleased with her recent dental extraction  SUMMARY OF ONCOLOGIC HISTORY: Oncology History  Laryngeal squamous cell carcinoma ([place])  12/02/2018 Imaging   CT neck: IMPRESSION: 1. Supraglottic laryngeal carcinoma extending across the anterior commissure and invading the prelaryngeal fat, left aryepiglottic fold and left aspect of the epiglottis. 2. Severe narrowing of the laryngeal airway. 3. Bilateral subcentimeter level 2A and 2A cervical lymph nodes.     12/02/2018 Imaging   CT  Chest:  IMPRESSION: No evidence of metastatic disease in the chest.   12/03/2018 Pathology Results   (Care Everywhere; [facility]) A: Larynx, supraglottis, biopsy - Atypical  squamous proliferation with at least low grade dysplasia and abundant keratinization - No definite high grade dysplasia or invasive carcinoma represented (see comment)   12/18/2018 Pathology Results   (Care Everywhere; [facility]) A:  Neck, left, fine needle aspiration - Rare single atypical cells, no cells diagnostic of malignancy - Lymph node material present  B:  Neck, right, fine needle aspiration - Non-diagnostic specimen - No lymph node material identified - Mixed inflammation and blood   12/31/2018 Pathology Results   (Care Everywhere; [facility])  A: Larynx, left supraglottic mass, biopsy - Atypical squamous proliferation with abundant keratinization, suspicious for well-differentiated invasive squamous cell carcinoma  B: Larynx, left supraglottic mass, biopsy - Well-differentiated invasive squamous cell carcinoma with abundant keratinization (see comment)   01/18/2019 Initial Diagnosis   Laryngeal cancer ([place])   01/26/2019 Imaging   1. Locally advanced hypermetabolic laryngeal neoplasm centered in the left supraglottic space with bilateral glottic and epiglottic involvement and subglottic extension on the left. 2. Hypermetabolic bilateral level 3 neck nodal metastases. 3. Hypermetabolic high mediastinal nodal metastases between the trachea and upper thoracic esophagus. 4. Otherwise no distant hypermetabolic metastatic disease. 5. Nodular 7 mm focus at the left lung base, below PET resolution, recommend attention on follow-up chest CT in 3 months. 6. Chronic findings include: Aortic Atherosclerosis (ICD10-I70.0). Cholelithiasis.   02/08/2019 Cancer Staging   Staging form: Larynx - Supraglottis, AJCC 8th Edition - Clinical stage from 02/08/2019: Stage IVA (cT3, cN2c, cM0) - Signed by Eppie Gibson, MD on 02/08/2019   02/11/2019 - 04/07/2019 Chemotherapy   The patient weekly cisplatin for chemotherapy treatment.     07/20/2019 Pathology Results  PROCEDURE: Total glossectomy,  total pharyngectomy, total laryngectomy, total thyroidectomy, and cervical esophagectomy TUMOR SITE: Larynx TUMOR LATERALITY: Not specified TUMOR FOCALITY: Unifocal TUMOR SIZE:    GREATEST DIMENSION: 6.9 cm    ADDITIONAL DIMENSIONS: 5.4 x 2.4 cm HISTOLOGIC TYPE: Squamous cell carcinoma, conventional (keratinizing) HISTOLOGIC GRADE:  G1-G2: Well to moderately differentiated TUMOR EXTENSION: Tumor involves the posterior tongue, pharynx, larynx, tracheal cartilage, hyoid bone, cervical esophagus, vagus nerve, carotid artery, deep cervical fascia, ans surrounding skeletal muscle and soft tissue MARGINS: Uninvolved by invasive tumor; Perineural invasion is less than 72mm from the left soft tissue margin LYMPHOVASCULAR INVASION: Not identified PERINEURAL INVASION:  Present REGIONAL LYMPH NODES:     NUMBER OF LYMPH NODES INVOLVED: 2     NUMBER OF LYMPH NODES EXAMINED: 24     LATERALITY OF LYMPH NODES INVOLVED: Cannot determine     SIZE OF LARGEST METASTATIC DEPOSIT: 0.8 cm     EXTRANODAL EXTENSION: Not identified PATHOLOGIC STAGE CLASSIFICATION (pTNM, AJCC 8TH Ed): (r)(y) pT4b pN2a DISTANT METASTASIS (pM): ADDITIONAL PATHOLOGIC FINDINGS: Previous chemoradiation   11/04/2019 Imaging   PET: 1.  Hypermetabolic bilateral neck lesions as detailed above are concerning for disease recurrence. 2.  Compared to 07/08/2019 PET scan, interval development of subcentimeter bilateral upper lobe pulmonary nodules, size below PET resolution. These are concerning for metastasis. Consider diagnostic quality CT chest exam for further evaluation as per oncology treatment planning. 3.  Focal hypermetabolic uptake in right ventricular cavity of indeterminate significance. Recommend echocardiographic examination to rule out underlying thrombus. This finding was discussed with Dr. Conley Canal by Dr. Clementeen Graham and Dr. Marcello Moores on 11/04/2019 at approximately 3:00 PM via telephone. 4.  Ancillary CT findings as above.    11/05/2019 Imaging   Echocardiogram: Summary   1. The left ventricle is normal in size with normal wall thickness.   2. The left ventricular systolic function is normal, LVEF is visually estimated at 60-65%.   3. The right ventricle is normal in size, with normal systolic function.   4. There is an extrinsic compression of the RV and RA laterally at the AV groove by a mass of 4.3 cm diameter. Unclear if the mass is invasive, although no pericardial effusion seen suggestive no invasion possibly. There is no evidence of hemodynamic compromise secondary to cardiac compression with normal IVC diameter.   12/06/2019 Pathology Results   A. LYMPH NODE, LEFT SUBMANDIBULAR, BIOPSY:  - Squamous cell carcinoma.  - No distinct nodal tissue identified.    12/06/2019 Procedure   Successful ultrasound-guided core biopsy of centrally necrotic left submandibular lymph node. Of note, due to extensive prior postsurgical and post radiation changes, patient has very limited mobility in the neck and the biopsy was extremely challenging    12/16/2019 -  Chemotherapy   The patient had pembrolizumab for chemotherapy treatment.     03/08/2020 PET scan   1. Diminished size of masses in the neck as described. Persistent increased metabolic activity particularly in the LEFT neck. Some of this diminished size on the LEFT could be due to decompression of necrotic material. Correlate with any symptoms of inflammation or infection in this area as well given the paucity of residual soft tissue associated with the above abnormality, overall findings are compatible with residual disease in this location and there is no significant surrounding stranding to indicate inflammation that would correlate with the degree of FDG uptake remaining. 2. Diminished activity in the juxta cardiac mass with peripheral activity suggesting interval necrosis, on the prior study this was  uniformly hypermetabolic. There is still considerable FDG  uptake. 3. No signs of disease in the abdomen or pelvis. 4. 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 Correlate with any new pain or symptoms in this area with further imaging as warranted. No discrete mass or bone destruction on today's study.   06/08/2020 Imaging   CT neck 1. Status post laryngectomy and neck dissection without lymphadenopathy or mass lesion. 2. Short segment severe stenosis of the left internal carotid artery adjacent to a surgical clip.     06/08/2020 Imaging   CT chest 1. There is a large, hypodense lesion within, or very closely abutting and likely intruding upon the right ventricle, as seen on prior examination and approximately 4.6 x 4.3 cm, poorly evaluated due to cardiac motion. Findings are consistent with metastatic lesion and/or thrombus and poorly assessed by ungated CT. Consider echocardiography and cardiac MRI for further assessment.   2. Multiple small pulmonary nodules in the bilateral lung apices are unchanged. Attention on follow-up.   3.  No evidence of new metastatic disease in the chest.   4.  Aortic Atherosclerosis (ICD10-I70.0).     09/07/2020 Imaging   1. Mild decrease in size of juxta cardiac metastatic involving the right ventricle. 2. Small pulmonary nodules are unchanged from previous exam. 3. Aortic atherosclerosis. 4. Gallstone.       REVIEW OF SYSTEMS:   Constitutional: Denies fevers, chills or abnormal weight loss Eyes: Denies blurriness of vision Ears, nose, mouth, throat, and face: Denies mucositis or sore throat Respiratory: Denies cough, dyspnea or wheezes Cardiovascular: Denies palpitation, chest discomfort or lower extremity swelling Gastrointestinal:  Denies nausea, heartburn or change in bowel habits Skin: Denies abnormal skin rashes Lymphatics: Denies new lymphadenopathy or easy bruising Neurological:Denies numbness, tingling or new weaknesses Behavioral/Psych: Mood is stable, no new changes  All other systems were  reviewed with the patient and are negative.  I have reviewed the past medical history, past surgical history, social history and family history with the patient and they are unchanged from previous note.  ALLERGIES:  has No Known Allergies.  MEDICATIONS:  Current Outpatient Medications  Medication Sig Dispense Refill  . chlorhexidine (PERIDEX) 0.12 % solution Use as directed 15 mLs in the mouth or throat 2 (two) times daily. Rinse with 15 mls twice daily for 30 seconds. Use after breakfast and at bedtime. Spit out excess. Do not swallow. 473 mL 2  . Cholecalciferol (VITAMIN D3) 125 MCG (5000 UT) CAPS Take 5,000 Units by mouth daily.    . fluconazole (DIFLUCAN) 100 MG tablet Take 1 tablet (100 mg total) by mouth daily. 7 tablet 0  . HYDROcodone-acetaminophen (NORCO) 5-325 MG tablet Take 1 tablet by mouth every 6 (six) hours as needed for moderate pain. 60 tablet 0  . levothyroxine (SYNTHROID) 137 MCG tablet Take 137 mcg by mouth daily before breakfast.    . lidocaine-prilocaine (EMLA) cream Apply to affected area once (Patient taking differently: Apply 1 application topically daily as needed (port access). Apply to affected area once) 30 g 3  . morphine (MS CONTIN) 15 MG 12 hr tablet Take 15 mg by mouth at bedtime as needed for pain.    . Nutritional Supplements (KATE FARMS PEPTIDE 1.5) LIQD Take 4 Bottles by mouth daily. 325 mL 11  . ondansetron (ZOFRAN) 8 MG tablet Take 1 tablet (8 mg total) by mouth 2 (two) times daily as needed (Nausea or vomiting). 30 tablet 1  . prochlorperazine (COMPAZINE) 10 MG tablet Take 1 tablet (10  mg total) by mouth every 6 (six) hours as needed (Nausea or vomiting). 30 tablet 1  . sodium fluoride (PREVIDENT 5000 PLUS) 1.1 % CREA dental cream Apply cream to tooth brush. Brush teeth for 2 minutes. Spit out excess. DO NOT rinse afterwards. Repeat nightly. (Patient taking differently: Place 1 application onto teeth 2 (two) times a day.) 1 Tube prn   No current  facility-administered medications for this visit.   Facility-Administered Medications Ordered in Other Visits  Medication Dose Route Frequency Provider Last Rate Last Admin  . heparin lock flush 100 unit/mL  500 Units Intracatheter Once PRN Alvy Bimler, Crist Kruszka, MD      . pembrolizumab (KEYTRUDA) 200 mg in sodium chloride 0.9 % 50 mL chemo infusion  200 mg Intravenous Once Heath Lark, MD 116 mL/hr at 11/10/20 0939 200 mg at 11/10/20 0939  . sodium chloride flush (NS) 0.9 % injection 10 mL  10 mL Intracatheter PRN Alvy Bimler, Stelios Kirby, MD        PHYSICAL EXAMINATION: ECOG PERFORMANCE STATUS: 1 - Symptomatic but completely ambulatory  Vitals:   11/10/20 0855  BP: 92/66  Pulse: 78  Resp: 18  Temp: (!) 97.4 F (36.3 C)  SpO2: 96%   Filed Weights   11/10/20 0855  Weight: 137 lb 6.4 oz (62.3 kg)    GENERAL:alert, no distress and comfortable SKIN: skin color, texture, turgor are normal, no rashes or significant lesions EYES: normal, Conjunctiva are pink and non-injected, sclera clear OROPHARYNX: Noted complete dental extraction.  Persistent pressure at the roof of the mouth but much improved compared to prior visit NECK: Tracheostomy site looks okay LYMPH:  no palpable lymphadenopathy in the cervical, axillary or inguinal LUNGS: clear to auscultation and percussion with normal breathing effort HEART: regular rate & rhythm and no murmurs and no lower extremity edema ABDOMEN:abdomen soft, non-tender and normal bowel sounds Musculoskeletal:no cyanosis of digits and no clubbing  NEURO: alert & oriented , no focal motor/sensory deficits  LABORATORY DATA:  I have reviewed the data as listed    Component Value Date/Time   NA 138 11/10/2020 0826   K 4.2 11/10/2020 0826   CL 103 11/10/2020 0826   CO2 25 11/10/2020 0826   GLUCOSE 103 (H) 11/10/2020 0826   BUN 26 (H) 11/10/2020 0826   CREATININE 0.80 11/10/2020 0826   CREATININE 0.60 02/17/2020 1128   CALCIUM 8.9 11/10/2020 0826   PROT 7.3 11/10/2020  0826   ALBUMIN 3.8 11/10/2020 0826   AST 16 11/10/2020 0826   AST 22 02/17/2020 1128   ALT 9 11/10/2020 0826   ALT 24 02/17/2020 1128   ALKPHOS 70 11/10/2020 0826   BILITOT 0.4 11/10/2020 0826   BILITOT 0.5 02/17/2020 1128   GFRNONAA >60 11/10/2020 0826   GFRNONAA >60 02/17/2020 1128   GFRAA >60 06/08/2020 1303   GFRAA >60 02/17/2020 1128    No results found for: SPEP, UPEP  Lab Results  Component Value Date   WBC 4.9 11/10/2020   NEUTROABS 2.9 11/10/2020   HGB 12.4 11/10/2020   HCT 37.8 11/10/2020   MCV 90.6 11/10/2020   PLT 243 11/10/2020      Chemistry      Component Value Date/Time   NA 138 11/10/2020 0826   K 4.2 11/10/2020 0826   CL 103 11/10/2020 0826   CO2 25 11/10/2020 0826   BUN 26 (H) 11/10/2020 0826   CREATININE 0.80 11/10/2020 0826   CREATININE 0.60 02/17/2020 1128      Component Value Date/Time  CALCIUM 8.9 11/10/2020 0826   ALKPHOS 70 11/10/2020 0826   AST 16 11/10/2020 0826   AST 22 02/17/2020 1128   ALT 9 11/10/2020 0826   ALT 24 02/17/2020 1128   BILITOT 0.4 11/10/2020 0826   BILITOT 0.5 02/17/2020 1128

## 2020-11-21 ENCOUNTER — Other Ambulatory Visit: Payer: Self-pay

## 2020-11-21 ENCOUNTER — Ambulatory Visit (HOSPITAL_COMMUNITY): Payer: Dental | Admitting: Dentistry

## 2020-11-21 DIAGNOSIS — K08199 Complete loss of teeth due to other specified cause, unspecified class: Secondary | ICD-10-CM

## 2020-11-21 DIAGNOSIS — R633 Feeding difficulties, unspecified: Secondary | ICD-10-CM | POA: Diagnosis not present

## 2020-11-21 DIAGNOSIS — C321 Malignant neoplasm of supraglottis: Secondary | ICD-10-CM | POA: Diagnosis not present

## 2020-11-21 MED ORDER — NYSTATIN 100000 UNIT/ML MT SUSP: 5.0000 mL | Freq: Four times a day (QID) | OROMUCOSAL | 0 refills | Status: DC

## 2020-11-21 NOTE — Progress Notes (Signed)
Department of Dental Medicine    POSTOPERATIVE VISIT  Service Date:   11/21/2020 Patient Name:  [name] [name] Date of Birth:   1951-06-16 Medical Record Number: [id number]   PLAN/RECOMMENDATIONS   >>   The patient continues to heal well and consistent with dental procedures performed. >>    Recommend topical solution for oral thrush. Rx written for Nystatin today. >>    Plan is to follow-up as needed.  >>   Recommend the patient establish care at a dental office of their choice for routine dental care including replacement of missing teeth, cleanings/periodontal therapy and periodic exams. >>   Discussed in detail all treatment options with the patient and they are agreeable to the plan.   11/21/2020     CONSULT NOTE   COVID 19 SCREENING: The patient denies symptoms concerning for COVID-19 infection including fever, chills, cough, or newly developed shortness of breath.   HISTORY OF PRESENT ILLNESS: [name] [name] presents today for a 2nd postoperative visit s/p full mouth extractions in the operating room on 10/26/20.   >>   Medical and dental history reviewed with the patient.  No changes reported.   CHIEF COMPLAINT:  Patient with no complaints with extraction sites.  She does report severe itching and some slight burning on her palate, oropharynx region and floor of mouth region.  She does state having oral thrush and this has been occurring intermittently for 2 years now since one of her surgeries.     Patient Active Problem List   Diagnosis Date Noted  . Lung mass 09/05/2020  . Oral thrush 05/18/2020  . Poor dentition 05/18/2020  . Radiation-induced fibrosis of skin from therapeutic procedure 05/18/2020  . Preventive measure 05/18/2020  . Tracheostomy care ([place]) 03/30/2020  . Hypoparathyroidism ([place]) 02/09/2020  . Pancytopenia, acquired ([place]) 01/27/2020  . Deficiency anemia 01/27/2020  . Cancer associated pain 01/27/2020  . Alaryngeal voice 12/15/2019  . Goals of  care, counseling/discussion 07/07/2019  . Tracheostomy dependence ([place]) 04/05/2019  . Hyponatremia 03/31/2019  . Anemia due to antineoplastic chemotherapy 03/03/2019  . Protein malnutrition ([place]) 03/03/2019  . Port-A-Cath in place 02/10/2019  . Laryngeal squamous cell carcinoma ([place]) 01/18/2019  . Acute respiratory failure with hypoxia and hypercapnia ([place]) 12/03/2018  . Hypothyroidism 12/03/2018  . Laryngeal mass 12/03/2018  . Type 2 MI (myocardial infarction) ([place]) 12/03/2018   Past Medical History:  Diagnosis Date  . Cataract   . History of radiation therapy 02/11/19- 04/06/19   bilateral neck nodes and upper mediastinum  . Laryngeal cancer ([place]) 11/2018  . Thyroid disease    Past Surgical History:  Procedure Laterality Date  . CATARACT EXTRACTION, BILATERAL  left 05/20/16, right 06/03/16   Cataract Extraction w/ IOL Implant; Surgeon: Sudie Grumbling, MD; Location: [facility]; Service: Ophthalmology   . CESAREAN SECTION    . ESOPHAGOGASTRODUODENOSCOPY (EGD) WITH PROPOFOL N/A 02/01/2019   Procedure: ESOPHAGOGASTRODUODENOSCOPY (EGD) WITH PROPOFOL;  Surgeon: Milus Banister, MD;  Location: [facility];  Service: Endoscopy;  Laterality: N/A;  . EUS N/A 02/01/2019   Procedure: UPPER ENDOSCOPIC ULTRASOUND (EUS) RADIAL-Will need Linear and Forward Viewing EUS Scope;  Surgeon: Milus Banister, MD;  Location: [facility];  Service: Endoscopy;  Laterality: N/A;  . FINE NEEDLE ASPIRATION N/A 02/01/2019   Procedure: FINE NEEDLE ASPIRATION (FNA) LINEAR;  Surgeon: Milus Banister, MD;  Location: [facility];  Service: Endoscopy;  Laterality: N/A;  . IR GASTROSTOMY TUBE MOD SED  02/09/2019  . IR IMAGING GUIDED PORT INSERTION  02/02/2019  . IR PATIENT EVAL TECH 0-60 MINS  01/06/2020  . IR RADIOLOGIST EVAL & MGMT  04/10/2020  . KNEE CARTILAGE SURGERY Right 2019  . MULTIPLE EXTRACTIONS WITH ALVEOLOPLASTY N/A 10/26/2020   Procedure: MULTIPLE EXTRACTION WITH ALVEOLOPLASTY;  Surgeon: Charlaine Dalton,  DMD;  Location: [facility];  Service: Dentistry;  Laterality: N/A;  . TRACHEOSTOMY  12/02/2018   [facility]   Current Outpatient Medications  Medication Sig Dispense Refill  . nystatin (MYCOSTATIN) 100000 UNIT/ML suspension Use as directed 5 mLs (500,000 Units total) in the mouth or throat 4 (four) times daily for 10 days. SWISH and SPIT in mouth/throat 4 times a day for 10 days.  DO NOT SWALLOW. 60 mL 0  . chlorhexidine (PERIDEX) 0.12 % solution Use as directed 15 mLs in the mouth or throat 2 (two) times daily. Rinse with 15 mls twice daily for 30 seconds. Use after breakfast and at bedtime. Spit out excess. Do not swallow. 473 mL 2  . Cholecalciferol (VITAMIN D3) 125 MCG (5000 UT) CAPS Take 5,000 Units by mouth daily.    . fluconazole (DIFLUCAN) 100 MG tablet Take 1 tablet (100 mg total) by mouth daily. 7 tablet 0  . HYDROcodone-acetaminophen (NORCO) 5-325 MG tablet Take 1 tablet by mouth every 6 (six) hours as needed for moderate pain. 60 tablet 0  . levothyroxine (SYNTHROID) 137 MCG tablet Take 137 mcg by mouth daily before breakfast.    . lidocaine-prilocaine (EMLA) cream Apply to affected area once (Patient taking differently: Apply 1 application topically daily as needed (port access). Apply to affected area once) 30 g 3  . morphine (MS CONTIN) 15 MG 12 hr tablet Take 15 mg by mouth at bedtime as needed for pain.    . Nutritional Supplements (KATE FARMS PEPTIDE 1.5) LIQD Take 4 Bottles by mouth daily. 325 mL 11  . ondansetron (ZOFRAN) 8 MG tablet Take 1 tablet (8 mg total) by mouth 2 (two) times daily as needed (Nausea or vomiting). 30 tablet 1  . prochlorperazine (COMPAZINE) 10 MG tablet Take 1 tablet (10 mg total) by mouth every 6 (six) hours as needed (Nausea or vomiting). 30 tablet 1  . sodium fluoride (PREVIDENT 5000 PLUS) 1.1 % CREA dental cream Apply cream to tooth brush. Brush teeth for 2 minutes. Spit out excess. DO NOT rinse afterwards. Repeat nightly. (Patient taking differently:  Place 1 application onto teeth 2 (two) times a day.) 1 Tube prn   No current facility-administered medications for this visit.   No Known Allergies  LABS: Lab Results  Component Value Date   WBC 4.9 11/10/2020   HGB 12.4 11/10/2020   HCT 37.8 11/10/2020   MCV 90.6 11/10/2020   PLT 243 11/10/2020   BMET    Component Value Date/Time   NA 138 11/10/2020 0826   K 4.2 11/10/2020 0826   CL 103 11/10/2020 0826   CO2 25 11/10/2020 0826   GLUCOSE 103 (H) 11/10/2020 0826   BUN 26 (H) 11/10/2020 0826   CREATININE 0.80 11/10/2020 0826   CREATININE 0.60 02/17/2020 1128   CALCIUM 8.9 11/10/2020 0826   GFRNONAA >60 11/10/2020 0826   GFRNONAA >60 02/17/2020 1128   GFRAA >60 06/08/2020 1303   GFRAA >60 02/17/2020 1128    Lab Results  Component Value Date   INR 1.1 12/06/2019   INR 1.0 02/09/2019   INR 1.0 02/02/2019   No results found for: PTT   VITALS: BP 110/66 (BP Location: Right Arm)   Pulse 68  EXAM: >>  Extraction sites appear to be healing very well.  No signs of wound dehiscence or infection evident upon examination.  No sutures remain in-tact.   ASSESSMENT:  Postoperative course is consistent with dental procedures performed. 1. Loss of teeth due to Extraction 2. Oral thrush   PROCEDURES: 1. The patient was given a chlorhexidine gluconate rinse for 30 seconds prior to exam.   PLAN: >>   Follow-up as needed.  She states that she is not interested in any type of denture or removable protheses at this time and is very happy with her mouth as it is now that she no longer has bleeding, pain or infection. >>   Encouraged the patient to find an outside dental provider of their choice for comprehensive dental care including replacement of missing teeth and periodic exams.  Recommend that they discuss plans to return to the dentist for non-urgent treatment with their medical team to ensure they are medically optimized and there are no contraindications. >>   Patient  instructed to call should any questions or concerns arise.  >>   Rx for Nystatin topical solution (85mL 4 times daily for 10 days) written today and sent to patient's pharmacy for oral thrush.  <>   The patient verbalized understanding of discussion and plan.  All questions and concerns were addressed.   <>    The patient tolerated today's visit well and departed in stable condition.   Qu[place]ado Benson [place], DMD

## 2020-11-28 ENCOUNTER — Other Ambulatory Visit: Payer: Self-pay | Admitting: Hematology and Oncology

## 2020-12-01 ENCOUNTER — Inpatient Hospital Stay: Payer: Medicare HMO | Attending: Hematology

## 2020-12-01 ENCOUNTER — Other Ambulatory Visit: Payer: Self-pay | Admitting: Hematology and Oncology

## 2020-12-01 ENCOUNTER — Inpatient Hospital Stay (HOSPITAL_BASED_OUTPATIENT_CLINIC_OR_DEPARTMENT_OTHER): Payer: Medicare HMO | Admitting: Hematology and Oncology

## 2020-12-01 ENCOUNTER — Encounter: Payer: Self-pay | Admitting: Hematology and Oncology

## 2020-12-01 ENCOUNTER — Inpatient Hospital Stay: Payer: Medicare HMO

## 2020-12-01 ENCOUNTER — Telehealth: Payer: Self-pay | Admitting: Hematology and Oncology

## 2020-12-01 ENCOUNTER — Other Ambulatory Visit: Payer: Self-pay

## 2020-12-01 DIAGNOSIS — B370 Candidal stomatitis: Secondary | ICD-10-CM

## 2020-12-01 DIAGNOSIS — C329 Malignant neoplasm of larynx, unspecified: Secondary | ICD-10-CM

## 2020-12-01 DIAGNOSIS — Z87891 Personal history of nicotine dependence: Secondary | ICD-10-CM | POA: Insufficient documentation

## 2020-12-01 DIAGNOSIS — E039 Hypothyroidism, unspecified: Secondary | ICD-10-CM

## 2020-12-01 DIAGNOSIS — Z5112 Encounter for antineoplastic immunotherapy: Secondary | ICD-10-CM | POA: Diagnosis not present

## 2020-12-01 DIAGNOSIS — Z930 Tracheostomy status: Secondary | ICD-10-CM | POA: Diagnosis not present

## 2020-12-01 DIAGNOSIS — G893 Neoplasm related pain (acute) (chronic): Secondary | ICD-10-CM | POA: Diagnosis not present

## 2020-12-01 DIAGNOSIS — C321 Malignant neoplasm of supraglottis: Secondary | ICD-10-CM

## 2020-12-01 DIAGNOSIS — Z923 Personal history of irradiation: Secondary | ICD-10-CM | POA: Insufficient documentation

## 2020-12-01 DIAGNOSIS — Z95828 Presence of other vascular implants and grafts: Secondary | ICD-10-CM

## 2020-12-01 LAB — TSH: TSH: 7.656 u[IU]/mL — ABNORMAL HIGH (ref 0.308–3.960)

## 2020-12-01 LAB — CBC WITH DIFFERENTIAL/PLATELET
Abs Immature Granulocytes: 0.02 10*3/uL (ref 0.00–0.07)
Basophils Absolute: 0 10*3/uL (ref 0.0–0.1)
Basophils Relative: 0 %
Eosinophils Absolute: 0.3 10*3/uL (ref 0.0–0.5)
Eosinophils Relative: 7 %
HCT: 38.2 % (ref 36.0–46.0)
Hemoglobin: 12.6 g/dL (ref 12.0–15.0)
Immature Granulocytes: 0 %
Lymphocytes Relative: 23 %
Lymphs Abs: 1.1 10*3/uL (ref 0.7–4.0)
MCH: 29.4 pg (ref 26.0–34.0)
MCHC: 33 g/dL (ref 30.0–36.0)
MCV: 89.3 fL (ref 80.0–100.0)
Monocytes Absolute: 0.5 10*3/uL (ref 0.1–1.0)
Monocytes Relative: 11 %
Neutro Abs: 2.7 10*3/uL (ref 1.7–7.7)
Neutrophils Relative %: 59 %
Platelets: 215 10*3/uL (ref 150–400)
RBC: 4.28 MIL/uL (ref 3.87–5.11)
RDW: 13.2 % (ref 11.5–15.5)
WBC: 4.6 10*3/uL (ref 4.0–10.5)
nRBC: 0 % (ref 0.0–0.2)

## 2020-12-01 LAB — COMPREHENSIVE METABOLIC PANEL
ALT: 15 U/L (ref 0–44)
AST: 19 U/L (ref 15–41)
Albumin: 3.8 g/dL (ref 3.5–5.0)
Alkaline Phosphatase: 69 U/L (ref 38–126)
Anion gap: 9 (ref 5–15)
BUN: 23 mg/dL (ref 8–23)
CO2: 26 mmol/L (ref 22–32)
Calcium: 8.8 mg/dL — ABNORMAL LOW (ref 8.9–10.3)
Chloride: 104 mmol/L (ref 98–111)
Creatinine, Ser: 0.72 mg/dL (ref 0.44–1.00)
GFR, Estimated: >60 mL/min (ref >60)
Glucose, Bld: 97 mg/dL (ref 70–99)
Potassium: 4.2 mmol/L (ref 3.5–5.1)
Sodium: 139 mmol/L (ref 135–145)
Total Bilirubin: <0.2 mg/dL — ABNORMAL LOW (ref 0.3–1.2)
Total Protein: 7 g/dL (ref 6.5–8.1)

## 2020-12-01 MED ORDER — SODIUM CHLORIDE 0.9% FLUSH
10.0000 mL | INTRAVENOUS | Status: DC | PRN
  Administered 2020-12-01: 10 mL
  Filled 2020-12-01: qty 10

## 2020-12-01 MED ORDER — SODIUM CHLORIDE 0.9 % IV SOLN
Freq: Once | INTRAVENOUS | Status: AC
  Filled 2020-12-01: qty 250

## 2020-12-01 MED ORDER — SODIUM CHLORIDE 0.9% FLUSH
10.0000 mL | Freq: Once | INTRAVENOUS | Status: AC
  Administered 2020-12-01: 10 mL
  Filled 2020-12-01: qty 10

## 2020-12-01 MED ORDER — SODIUM CHLORIDE 0.9 % IV SOLN
200.0000 mg | Freq: Once | INTRAVENOUS | Status: AC
  Administered 2020-12-01: 200 mg via INTRAVENOUS
  Filled 2020-12-01: qty 8

## 2020-12-01 MED ORDER — NYSTATIN 100000 UNIT/ML MT SUSP: 5.0000 mL | Freq: Four times a day (QID) | OROMUCOSAL | 0 refills | Status: AC

## 2020-12-01 MED ORDER — HEPARIN SOD (PORK) LOCK FLUSH 100 UNIT/ML IV SOLN
500.0000 [IU] | Freq: Once | INTRAVENOUS | Status: AC | PRN
  Administered 2020-12-01: 500 [IU]
  Filled 2020-12-01: qty 5

## 2020-12-01 MED ORDER — HYDROCODONE-ACETAMINOPHEN 5-325 MG PO TABS: 1.0000 | ORAL_TABLET | Freq: Four times a day (QID) | ORAL | 0 refills | Status: DC | PRN

## 2020-12-01 NOTE — Patient Instructions (Signed)
[facility] Discharge Instructions for Patients Receiving Chemotherapy  Today you received the following chemotherapy agents: pembrolizumab.  To help prevent nausea and vomiting after your treatment, we encourage you to take your nausea medication as directed.   If you develop nausea and vomiting that is not controlled by your nausea medication, call the clinic.   BELOW ARE SYMPTOMS THAT SHOULD BE REPORTED IMMEDIATELY:  *FEVER GREATER THAN 100.5 F  *CHILLS WITH OR WITHOUT FEVER  NAUSEA AND VOMITING THAT IS NOT CONTROLLED WITH YOUR NAUSEA MEDICATION  *UNUSUAL SHORTNESS OF BREATH  *UNUSUAL BRUISING OR BLEEDING  TENDERNESS IN MOUTH AND THROAT WITH OR WITHOUT PRESENCE OF ULCERS  *URINARY PROBLEMS  *BOWEL PROBLEMS  UNUSUAL RASH Items with * indicate a potential emergency and should be followed up as soon as possible.  Feel free to call the clinic should you have any questions or concerns. The clinic phone number is [phone].  Please show the CHEMO ALERT CARD at check-in to the Emergency Department and triage nurse.   

## 2020-12-01 NOTE — Progress Notes (Signed)
[facility] OFFICE PROGRESS NOTE  Patient Care Team: Hamrick, Lorin Mercy, MD as PCP - General (Family Medicine)  ASSESSMENT & PLAN:  Laryngeal squamous cell carcinoma ([place]) Her last CT imaging show improved disease control We will continue immunotherapy indefinitely I plan to repeat imaging study again in a few months, due next around April/May 2022  Cancer associated pain Her pain is well controlled We discussed narcotic refill policy   Oral thrush Her oral thrush is improved The high plaque like changes on her oropharynx and not due to thrush, likely due to radiation changes I recommend close follow-up with her ENT surgeon  Hypothyroidism This is likely due to prior radiation exposure Her TSH is slightly elevated today I will observe only for today and plan to adjust the dose next visit if it remains elevated   No orders of the defined types were placed in this encounter.   All questions were answered. The patient knows to call the clinic with any problems, questions or concerns. The total time spent in the appointment was 20 minutes encounter with patients including review of chart and various tests results, discussions about plan of care and coordination of care plan   Heath Lark, MD 12/01/2020 12:45 PM  INTERVAL HISTORY: Please see below for problem oriented charting. She returns with her son for further follow-up She is requesting refill on nystatin Her oral pain is stable  SUMMARY OF ONCOLOGIC HISTORY: Oncology History  Laryngeal squamous cell carcinoma ([place])  12/02/2018 Imaging   CT neck: IMPRESSION: 1. Supraglottic laryngeal carcinoma extending across the anterior commissure and invading the prelaryngeal fat, left aryepiglottic fold and left aspect of the epiglottis. 2. Severe narrowing of the laryngeal airway. 3. Bilateral subcentimeter level 2A and 2A cervical lymph nodes.     12/02/2018 Imaging   CT  Chest:  IMPRESSION: No evidence of  metastatic disease in the chest.   12/03/2018 Pathology Results   (Care Everywhere; [facility]) A: Larynx, supraglottis, biopsy - Atypical squamous proliferation with at least low grade dysplasia and abundant keratinization - No definite high grade dysplasia or invasive carcinoma represented (see comment)   12/18/2018 Pathology Results   (Care Everywhere; [facility]) A:  Neck, left, fine needle aspiration - Rare single atypical cells, no cells diagnostic of malignancy - Lymph node material present  B:  Neck, right, fine needle aspiration - Non-diagnostic specimen - No lymph node material identified - Mixed inflammation and blood   12/31/2018 Pathology Results   (Care Everywhere; [facility])  A: Larynx, left supraglottic mass, biopsy - Atypical squamous proliferation with abundant keratinization, suspicious for well-differentiated invasive squamous cell carcinoma  B: Larynx, left supraglottic mass, biopsy - Well-differentiated invasive squamous cell carcinoma with abundant keratinization (see comment)   01/18/2019 Initial Diagnosis   Laryngeal cancer ([place])   01/26/2019 Imaging   1. Locally advanced hypermetabolic laryngeal neoplasm centered in the left supraglottic space with bilateral glottic and epiglottic involvement and subglottic extension on the left. 2. Hypermetabolic bilateral level 3 neck nodal metastases. 3. Hypermetabolic high mediastinal nodal metastases between the trachea and upper thoracic esophagus. 4. Otherwise no distant hypermetabolic metastatic disease. 5. Nodular 7 mm focus at the left lung base, below PET resolution, recommend attention on follow-up chest CT in 3 months. 6. Chronic findings include: Aortic Atherosclerosis (ICD10-I70.0). Cholelithiasis.   02/08/2019 Cancer Staging   Staging form: Larynx - Supraglottis, AJCC 8th Edition - Clinical stage from 02/08/2019: Stage IVA (cT3, cN2c, cM0) - Signed by Eppie Gibson, MD  on 02/08/2019   02/11/2019 -  04/07/2019 Chemotherapy   The patient weekly cisplatin for chemotherapy treatment.     07/20/2019 Pathology Results   PROCEDURE: Total glossectomy, total pharyngectomy, total laryngectomy, total thyroidectomy, and cervical esophagectomy TUMOR SITE: Larynx TUMOR LATERALITY: Not specified TUMOR FOCALITY: Unifocal TUMOR SIZE:    GREATEST DIMENSION: 6.9 cm    ADDITIONAL DIMENSIONS: 5.4 x 2.4 cm HISTOLOGIC TYPE: Squamous cell carcinoma, conventional (keratinizing) HISTOLOGIC GRADE:  G1-G2: Well to moderately differentiated TUMOR EXTENSION: Tumor involves the posterior tongue, pharynx, larynx, tracheal cartilage, hyoid bone, cervical esophagus, vagus nerve, carotid artery, deep cervical fascia, ans surrounding skeletal muscle and soft tissue MARGINS: Uninvolved by invasive tumor; Perineural invasion is less than 29mm from the left soft tissue margin LYMPHOVASCULAR INVASION: Not identified PERINEURAL INVASION:  Present REGIONAL LYMPH NODES:     NUMBER OF LYMPH NODES INVOLVED: 2     NUMBER OF LYMPH NODES EXAMINED: 24     LATERALITY OF LYMPH NODES INVOLVED: Cannot determine     SIZE OF LARGEST METASTATIC DEPOSIT: 0.8 cm     EXTRANODAL EXTENSION: Not identified PATHOLOGIC STAGE CLASSIFICATION (pTNM, AJCC 8TH Ed): (r)(y) pT4b pN2a DISTANT METASTASIS (pM): ADDITIONAL PATHOLOGIC FINDINGS: Previous chemoradiation   11/04/2019 Imaging   PET: 1.  Hypermetabolic bilateral neck lesions as detailed above are concerning for disease recurrence. 2.  Compared to 07/08/2019 PET scan, interval development of subcentimeter bilateral upper lobe pulmonary nodules, size below PET resolution. These are concerning for metastasis. Consider diagnostic quality CT chest exam for further evaluation as per oncology treatment planning. 3.  Focal hypermetabolic uptake in right ventricular cavity of indeterminate significance. Recommend echocardiographic examination to rule out underlying thrombus. This finding was  discussed with Dr. Conley Canal by Dr. Clementeen Graham and Dr. Marcello Moores on 11/04/2019 at approximately 3:00 PM via telephone. 4.  Ancillary CT findings as above.   11/05/2019 Imaging   Echocardiogram: Summary   1. The left ventricle is normal in size with normal wall thickness.   2. The left ventricular systolic function is normal, LVEF is visually estimated at 60-65%.   3. The right ventricle is normal in size, with normal systolic function.   4. There is an extrinsic compression of the RV and RA laterally at the AV groove by a mass of 4.3 cm diameter. Unclear if the mass is invasive, although no pericardial effusion seen suggestive no invasion possibly. There is no evidence of hemodynamic compromise secondary to cardiac compression with normal IVC diameter.   12/06/2019 Pathology Results   A. LYMPH NODE, LEFT SUBMANDIBULAR, BIOPSY:  - Squamous cell carcinoma.  - No distinct nodal tissue identified.    12/06/2019 Procedure   Successful ultrasound-guided core biopsy of centrally necrotic left submandibular lymph node. Of note, due to extensive prior postsurgical and post radiation changes, patient has very limited mobility in the neck and the biopsy was extremely challenging    12/16/2019 -  Chemotherapy   The patient had pembrolizumab for chemotherapy treatment.     03/08/2020 PET scan   1. Diminished size of masses in the neck as described. Persistent increased metabolic activity particularly in the LEFT neck. Some of this diminished size on the LEFT could be due to decompression of necrotic material. Correlate with any symptoms of inflammation or infection in this area as well given the paucity of residual soft tissue associated with the above abnormality, overall findings are compatible with residual disease in this location and there is no significant surrounding stranding to indicate inflammation that would correlate with  the degree of FDG uptake remaining. 2. Diminished activity in the juxta cardiac  mass with peripheral activity suggesting interval necrosis, on the prior study this was uniformly hypermetabolic. There is still considerable FDG uptake. 3. No signs of disease in the abdomen or pelvis. 4. 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 Correlate with any new pain or symptoms in this area with further imaging as warranted. No discrete mass or bone destruction on today's study.   06/08/2020 Imaging   CT neck 1. Status post laryngectomy and neck dissection without lymphadenopathy or mass lesion. 2. Short segment severe stenosis of the left internal carotid artery adjacent to a surgical clip.     06/08/2020 Imaging   CT chest 1. There is a large, hypodense lesion within, or very closely abutting and likely intruding upon the right ventricle, as seen on prior examination and approximately 4.6 x 4.3 cm, poorly evaluated due to cardiac motion. Findings are consistent with metastatic lesion and/or thrombus and poorly assessed by ungated CT. Consider echocardiography and cardiac MRI for further assessment.   2. Multiple small pulmonary nodules in the bilateral lung apices are unchanged. Attention on follow-up.   3.  No evidence of new metastatic disease in the chest.   4.  Aortic Atherosclerosis (ICD10-I70.0).     09/07/2020 Imaging   1. Mild decrease in size of juxta cardiac metastatic involving the right ventricle. 2. Small pulmonary nodules are unchanged from previous exam. 3. Aortic atherosclerosis. 4. Gallstone.       REVIEW OF SYSTEMS:   Constitutional: Denies fevers, chills or abnormal weight loss Eyes: Denies blurriness of vision Ears, nose, mouth, throat, and face: Denies mucositis or sore throat Respiratory: Denies cough, dyspnea or wheezes Cardiovascular: Denies palpitation, chest discomfort or lower extremity swelling Gastrointestinal:  Denies nausea, heartburn or change in bowel habits Skin: Denies abnormal skin rashes Lymphatics: Denies new lymphadenopathy or  easy bruising Neurological:Denies numbness, tingling or new weaknesses Behavioral/Psych: Mood is stable, no new changes  All other systems were reviewed with the patient and are negative.  I have reviewed the past medical history, past surgical history, social history and family history with the patient and they are unchanged from previous note.  ALLERGIES:  has No Known Allergies.  MEDICATIONS:  Current Outpatient Medications  Medication Sig Dispense Refill  . chlorhexidine (PERIDEX) 0.12 % solution Use as directed 15 mLs in the mouth or throat 2 (two) times daily. Rinse with 15 mls twice daily for 30 seconds. Use after breakfast and at bedtime. Spit out excess. Do not swallow. 473 mL 2  . Cholecalciferol (VITAMIN D3) 125 MCG (5000 UT) CAPS Take 5,000 Units by mouth daily.    Marland Kitchen HYDROcodone-acetaminophen (NORCO) 5-325 MG tablet Take 1 tablet by mouth every 6 (six) hours as needed for moderate pain. 60 tablet 0  . levothyroxine (SYNTHROID) 137 MCG tablet Take 137 mcg by mouth daily before breakfast.    . lidocaine-prilocaine (EMLA) cream Apply to affected area once (Patient taking differently: Apply 1 application topically daily as needed (port access). Apply to affected area once) 30 g 3  . Nutritional Supplements (KATE FARMS PEPTIDE 1.5) LIQD Take 4 Bottles by mouth daily. 325 mL 11  . nystatin (MYCOSTATIN) 100000 UNIT/ML suspension Use as directed 5 mLs (500,000 Units total) in the mouth or throat 4 (four) times daily for 10 days. SWISH and SPIT in mouth/throat 4 times a day for 10 days.  DO NOT SWALLOW. 473 mL 0  . ondansetron (ZOFRAN) 8 MG tablet Take  1 tablet (8 mg total) by mouth 2 (two) times daily as needed (Nausea or vomiting). 30 tablet 1  . prochlorperazine (COMPAZINE) 10 MG tablet Take 1 tablet (10 mg total) by mouth every 6 (six) hours as needed (Nausea or vomiting). 30 tablet 1  . sodium fluoride (PREVIDENT 5000 PLUS) 1.1 % CREA dental cream Apply cream to tooth brush. Brush teeth  for 2 minutes. Spit out excess. DO NOT rinse afterwards. Repeat nightly. (Patient taking differently: Place 1 application onto teeth 2 (two) times a day.) 1 Tube prn   No current facility-administered medications for this visit.   Facility-Administered Medications Ordered in Other Visits  Medication Dose Route Frequency Provider Last Rate Last Admin  . heparin lock flush 100 unit/mL  500 Units Intracatheter Once PRN Alvy Bimler, Ni, MD      . pembrolizumab (KEYTRUDA) 200 mg in sodium chloride 0.9 % 50 mL chemo infusion  200 mg Intravenous Once Gorsuch, Ni, MD      . sodium chloride flush (NS) 0.9 % injection 10 mL  10 mL Intracatheter PRN Alvy Bimler, Ni, MD        PHYSICAL EXAMINATION: ECOG PERFORMANCE STATUS: 1 - Symptomatic but completely ambulatory  Vitals:   12/01/20 1058  BP: 112/69  Pulse: 69  Resp: 18  Temp: 97.6 F (36.4 C)  SpO2: 97%   Filed Weights   12/01/20 1058  Weight: 140 lb 9.6 oz (63.8 kg)    GENERAL:alert, no distress and comfortable SKIN: skin color, texture, turgor are normal, no rashes or significant lesions EYES: normal, Conjunctiva are pink and non-injected, sclera clear OROPHARYNX: No oral thrush at the roof of the mouth has improved.  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 NECK: Tracheostomy in situ LYMPH:  no palpable lymphadenopathy in the cervical, axillary or inguinal LUNGS: clear to auscultation and percussion with normal breathing effort HEART: regular rate & rhythm and no murmurs and no lower extremity edema ABDOMEN:abdomen soft, non-tender and normal bowel sounds Musculoskeletal:no cyanosis of digits and no clubbing  NEURO: alert & oriented, no focal motor/sensory deficits  LABORATORY DATA:  I have reviewed the data as listed    Component Value Date/Time   NA 139 12/01/2020 1033   K 4.2 12/01/2020 1033   CL 104 12/01/2020 1033   CO2 26 12/01/2020 1033   GLUCOSE 97 12/01/2020 1033   BUN 23 12/01/2020 1033   CREATININE 0.72  12/01/2020 1033   CREATININE 0.60 02/17/2020 1128   CALCIUM 8.8 (L) 12/01/2020 1033   PROT 7.0 12/01/2020 1033   ALBUMIN 3.8 12/01/2020 1033   AST 19 12/01/2020 1033   AST 22 02/17/2020 1128   ALT 15 12/01/2020 1033   ALT 24 02/17/2020 1128   ALKPHOS 69 12/01/2020 1033   BILITOT <0.2 (L) 12/01/2020 1033   BILITOT 0.5 02/17/2020 1128   GFRNONAA >60 12/01/2020 1033   GFRNONAA >60 02/17/2020 1128   GFRAA >60 06/08/2020 1303   GFRAA >60 02/17/2020 1128    No results found for: SPEP, UPEP  Lab Results  Component Value Date   WBC 4.6 12/01/2020   NEUTROABS 2.7 12/01/2020   HGB 12.6 12/01/2020   HCT 38.2 12/01/2020   MCV 89.3 12/01/2020   PLT 215 12/01/2020      Chemistry      Component Value Date/Time   NA 139 12/01/2020 1033   K 4.2 12/01/2020 1033   CL 104 12/01/2020 1033   CO2 26 12/01/2020 1033   BUN 23 12/01/2020 1033  CREATININE 0.72 12/01/2020 1033   CREATININE 0.60 02/17/2020 1128      Component Value Date/Time   CALCIUM 8.8 (L) 12/01/2020 1033   ALKPHOS 69 12/01/2020 1033   AST 19 12/01/2020 1033   AST 22 02/17/2020 1128   ALT 15 12/01/2020 1033   ALT 24 02/17/2020 1128   BILITOT <0.2 (L) 12/01/2020 1033   BILITOT 0.5 02/17/2020 1128

## 2020-12-01 NOTE — Patient Instructions (Signed)

## 2020-12-01 NOTE — Assessment & Plan Note (Signed)
Her oral thrush is improved The high plaque like changes on her oropharynx and not due to thrush, likely due to radiation changes I recommend close follow-up with her ENT surgeon

## 2020-12-01 NOTE — Telephone Encounter (Signed)
Scheduled appts per 3/11 sch msg. Gave pt a print out of AVS.

## 2020-12-01 NOTE — Assessment & Plan Note (Signed)
Her last CT imaging show improved disease control We will continue immunotherapy indefinitely I plan to repeat imaging study again in a few months, due next around April/May 2022

## 2020-12-01 NOTE — Assessment & Plan Note (Signed)
Her pain is well controlled We discussed narcotic refill policy

## 2020-12-01 NOTE — Assessment & Plan Note (Signed)
This is likely due to prior radiation exposure Her TSH is slightly elevated today I will observe only for today and plan to adjust the dose next visit if it remains elevated

## 2020-12-05 DIAGNOSIS — R633 Feeding difficulties, unspecified: Secondary | ICD-10-CM | POA: Diagnosis not present

## 2020-12-05 DIAGNOSIS — C321 Malignant neoplasm of supraglottis: Secondary | ICD-10-CM | POA: Diagnosis not present

## 2020-12-06 ENCOUNTER — Other Ambulatory Visit: Payer: Self-pay | Admitting: Hematology and Oncology

## 2020-12-06 DIAGNOSIS — C321 Malignant neoplasm of supraglottis: Secondary | ICD-10-CM

## 2020-12-06 MED ORDER — HYDROCODONE-ACETAMINOPHEN 5-325 MG PO TABS: 1.0000 | ORAL_TABLET | Freq: Four times a day (QID) | ORAL | 0 refills | Status: DC | PRN

## 2020-12-13 DIAGNOSIS — Z09 Encounter for follow-up examination after completed treatment for conditions other than malignant neoplasm: Secondary | ICD-10-CM | POA: Diagnosis not present

## 2020-12-13 DIAGNOSIS — R1314 Dysphagia, pharyngoesophageal phase: Secondary | ICD-10-CM | POA: Diagnosis not present

## 2020-12-14 ENCOUNTER — Other Ambulatory Visit: Payer: Self-pay

## 2020-12-14 DIAGNOSIS — C329 Malignant neoplasm of larynx, unspecified: Secondary | ICD-10-CM

## 2020-12-14 NOTE — Progress Notes (Signed)
Oncology Nurse Navigator Documentation  I received a phone call from [name] [name]'s caregiver [name]. She reports that [name] [name]'s PEG developed a hole in it this morning. These events were reported to Dr. Alvy Bimler and an order was placed for IR evaluation and management. [name] [name] is scheduled to be evaluated in Interventional Radiology tomorrow at 10:00 am. [name] and [name] [name] are aware of the appointment. They know to call me if they have any further questions or needs.   Harlow Asa RN, BSN, OCN Head & Neck Oncology Nurse [place] [facility] Phone # [phone]  Fax # [phone]

## 2020-12-15 ENCOUNTER — Other Ambulatory Visit: Payer: Self-pay | Admitting: Hematology and Oncology

## 2020-12-15 ENCOUNTER — Ambulatory Visit (HOSPITAL_COMMUNITY)
Admission: RE | Admit: 2020-12-15 | Discharge: 2020-12-15 | Disposition: A | Discharge status: Home / Self Care | Payer: Medicare HMO | Source: Ambulatory Visit | Attending: Hematology and Oncology | Admitting: Hematology and Oncology

## 2020-12-15 ENCOUNTER — Other Ambulatory Visit: Payer: Self-pay

## 2020-12-15 DIAGNOSIS — C329 Malignant neoplasm of larynx, unspecified: Secondary | ICD-10-CM | POA: Insufficient documentation

## 2020-12-15 DIAGNOSIS — K9423 Gastrostomy malfunction: Secondary | ICD-10-CM | POA: Diagnosis not present

## 2020-12-15 HISTORY — PX: IR REPLC GASTRO/COLONIC TUBE PERCUT W/FLUORO: IMG2333

## 2020-12-15 IMAGING — XA IR REPLACE G-TUBE/COLONIC TUBE
2 series · 8 of 8 positions shown · non-contrast
Comparison: none

INDICATION: 69-year-old female with history of laryngeal squamous cell carcinoma
and chronic indwelling gastrostomy tube presenting with multiple
holes in the exterior portion of the gastrostomy tube rendering it
useless.

[Series 2: fl - angio · 4 of 25 frames shown]
[frame 4/25]
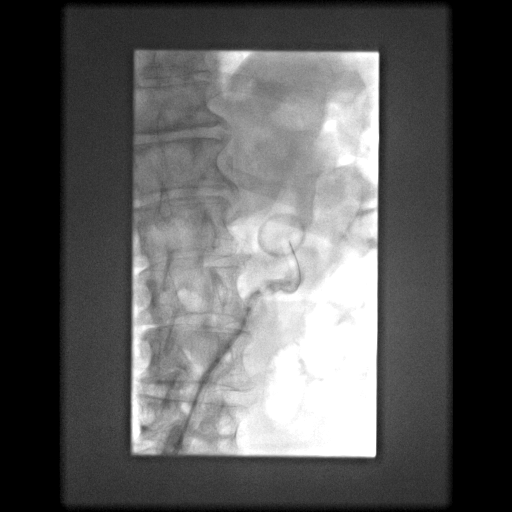
[frame 13/25]
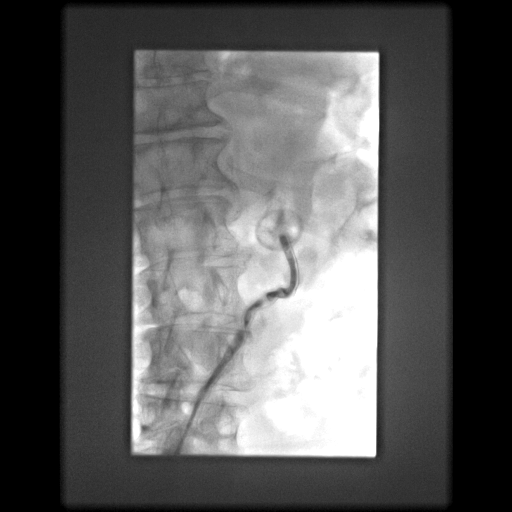
[frame 17/25]
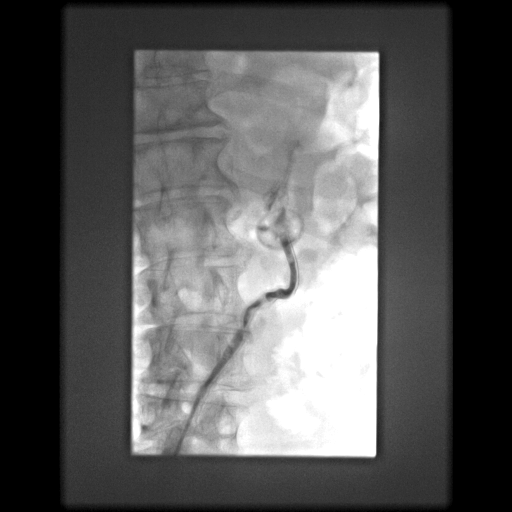
[frame 22/25]
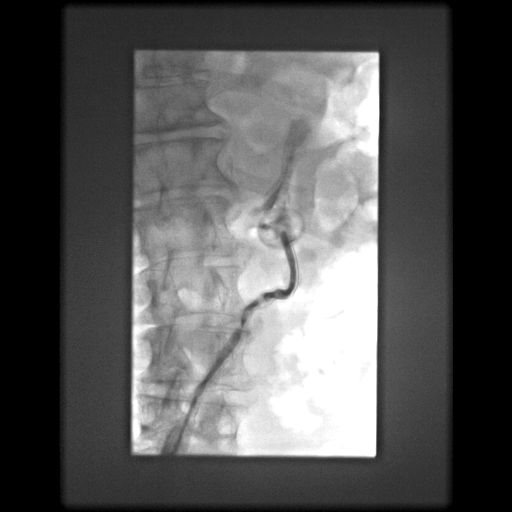

[Series 300: ir radiologist eval & mgmt · 4 of 4 slices shown]
[im 1/4]
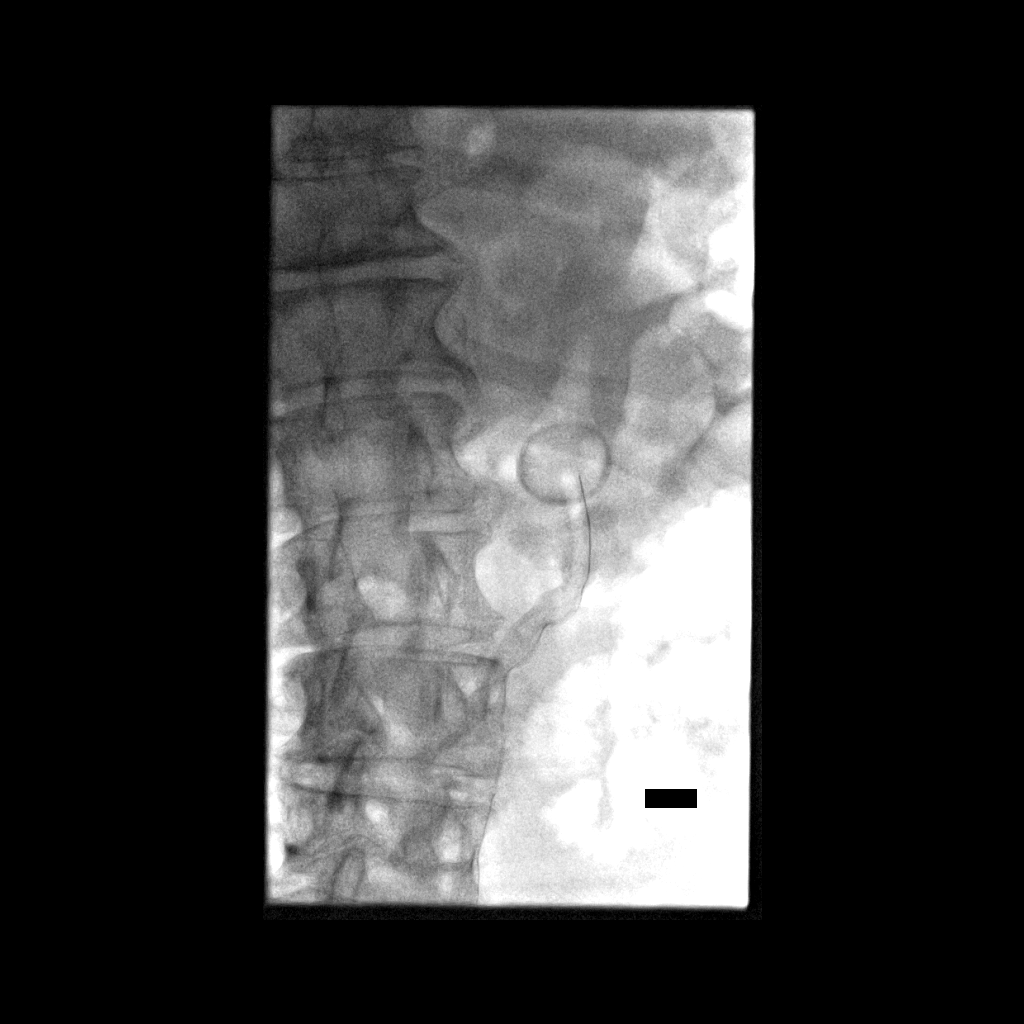
[im 2/4]
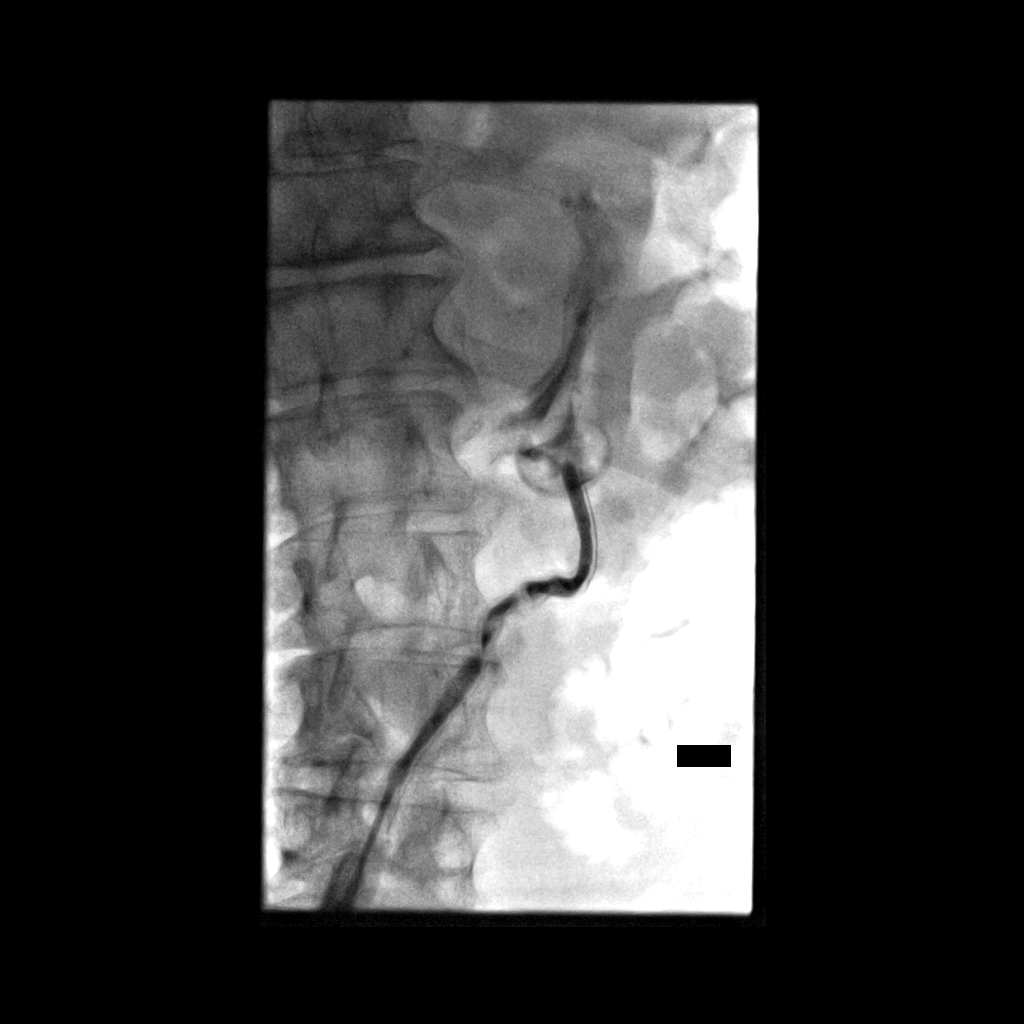
[im 3/4]
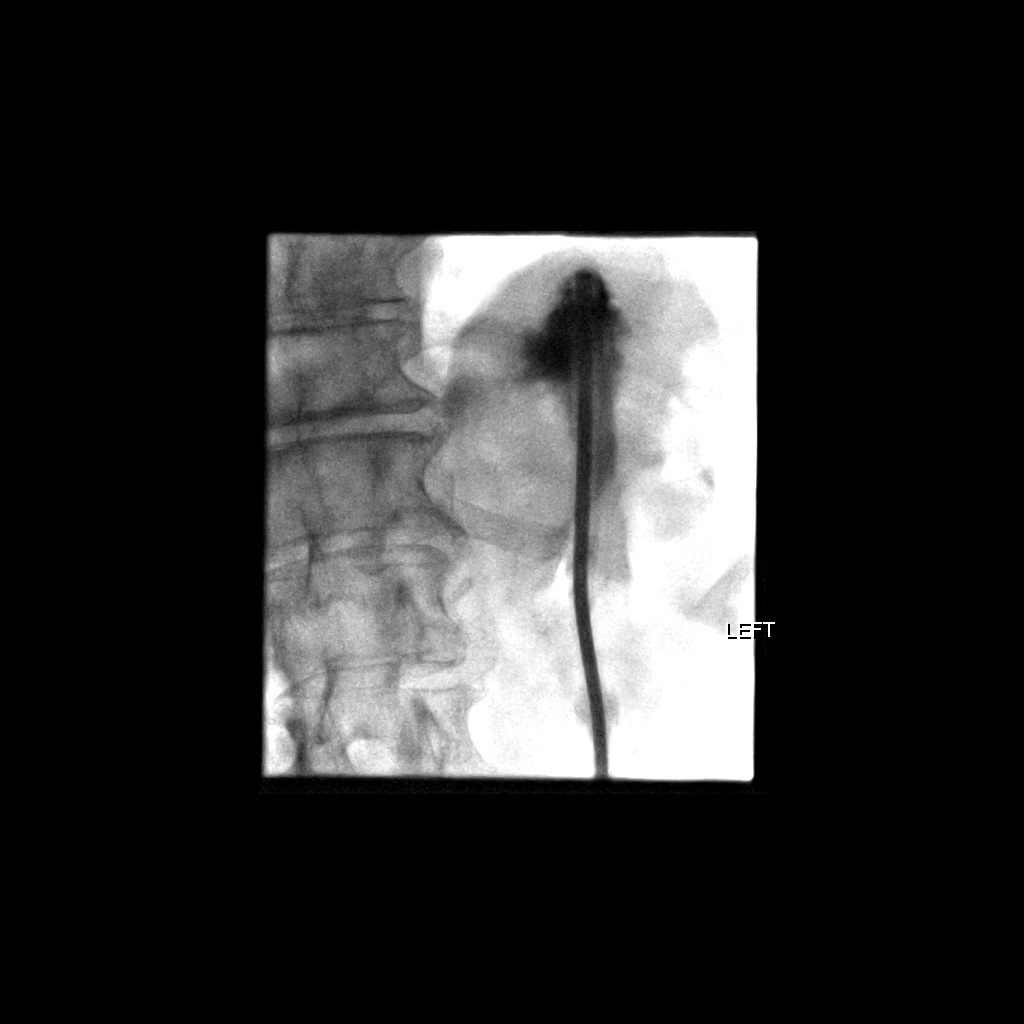
[im 4/4]
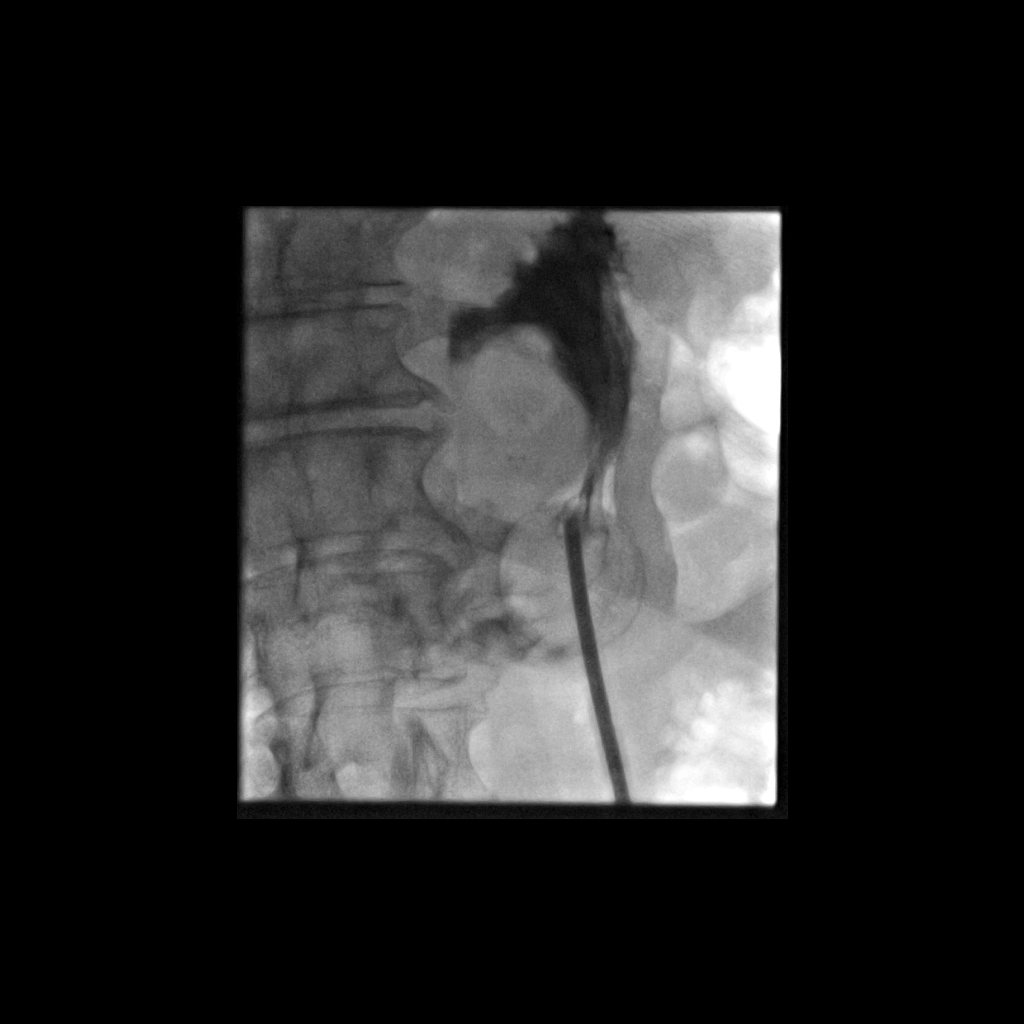

[8 of 8 positions shown; findings below may reference images not displayed]

EXAM:
GASTROSTOMY CATHETER REPLACEMENT

MEDICATIONS:
None.

ANESTHESIA/SEDATION:
None.

CONTRAST:  25mL OMNIPAQUE IOHEXOL 300 MG/ML SOLN - administered into
the gastric lumen.

FLUOROSCOPY TIME:  Fluoroscopy Time: 0 minutes 24 seconds (5 mGy).

COMPLICATIONS:
None immediate.

PROCEDURE:
Informed written consent was obtained from the patient after a
thorough discussion of the procedural risks, benefits and
alternatives. All questions were addressed. Maximal Sterile Barrier
Technique was utilized including caps, mask, sterile gowns, sterile
gloves, sterile drape, hand hygiene and skin antiseptic. A timeout
was performed prior to the initiation of the procedure.

Viscous lidocaine was applied along the indwelling gastrostomy
tract. Injection of contrast via the gastrostomy demonstrated
appropriate positioning within the stomach. An Amplatz wire was
inserted through the gastrostomy and coiled within the stomach. The
gastrostomy tube was then removed over the wire. A new, 22 French
gastrostomy tube was inserted over the wire. Hand injection of
contrast confirmed position within the stomach. The balloon was
insufflated with 20 mL of sterile water and gently retracted to the
anterior wall of the stomach. The bumper was cinched to the level of
the skin. Hand injection of contrast confirmed appropriate position.
The gastrostomy tube was then flushed with water. A bandage was
applied. The patient tolerated the procedure well.
IMPRESSION: Successful exchange and up size from 20 French pull-through
gastrostomy to 22 French balloon retention gastrostomy.

## 2020-12-15 MED ORDER — LIDOCAINE HCL 1 % IJ SOLN
INTRAMUSCULAR | Status: AC
  Administered 2020-12-15: 5 mL
  Filled 2020-12-15: qty 20

## 2020-12-15 MED ORDER — LIDOCAINE VISCOUS HCL 2 % MT SOLN
OROMUCOSAL | Status: AC
  Administered 2020-12-15: 2 mL
  Filled 2020-12-15: qty 15

## 2020-12-15 MED ORDER — IOHEXOL 300 MG/ML  SOLN
50.0000 mL | Freq: Once | INTRAMUSCULAR | Status: AC | PRN
  Administered 2020-12-15: 25 mL

## 2020-12-28 DIAGNOSIS — C321 Malignant neoplasm of supraglottis: Secondary | ICD-10-CM | POA: Diagnosis not present

## 2020-12-28 DIAGNOSIS — R633 Feeding difficulties, unspecified: Secondary | ICD-10-CM | POA: Diagnosis not present

## 2020-12-29 ENCOUNTER — Other Ambulatory Visit: Payer: Self-pay

## 2020-12-29 ENCOUNTER — Inpatient Hospital Stay (HOSPITAL_BASED_OUTPATIENT_CLINIC_OR_DEPARTMENT_OTHER): Payer: Medicare HMO | Admitting: Hematology and Oncology

## 2020-12-29 ENCOUNTER — Inpatient Hospital Stay: Payer: Medicare HMO

## 2020-12-29 ENCOUNTER — Inpatient Hospital Stay: Payer: Medicare HMO | Attending: Hematology

## 2020-12-29 ENCOUNTER — Other Ambulatory Visit (HOSPITAL_COMMUNITY): Payer: Self-pay

## 2020-12-29 ENCOUNTER — Encounter: Payer: Self-pay | Admitting: Hematology and Oncology

## 2020-12-29 DIAGNOSIS — E039 Hypothyroidism, unspecified: Secondary | ICD-10-CM

## 2020-12-29 DIAGNOSIS — E46 Unspecified protein-calorie malnutrition: Secondary | ICD-10-CM | POA: Insufficient documentation

## 2020-12-29 DIAGNOSIS — Z79899 Other long term (current) drug therapy: Secondary | ICD-10-CM | POA: Diagnosis not present

## 2020-12-29 DIAGNOSIS — Z923 Personal history of irradiation: Secondary | ICD-10-CM | POA: Insufficient documentation

## 2020-12-29 DIAGNOSIS — G893 Neoplasm related pain (acute) (chronic): Secondary | ICD-10-CM | POA: Diagnosis not present

## 2020-12-29 DIAGNOSIS — C329 Malignant neoplasm of larynx, unspecified: Secondary | ICD-10-CM

## 2020-12-29 DIAGNOSIS — C321 Malignant neoplasm of supraglottis: Secondary | ICD-10-CM | POA: Diagnosis not present

## 2020-12-29 DIAGNOSIS — Z87891 Personal history of nicotine dependence: Secondary | ICD-10-CM | POA: Diagnosis not present

## 2020-12-29 DIAGNOSIS — Z5112 Encounter for antineoplastic immunotherapy: Secondary | ICD-10-CM | POA: Insufficient documentation

## 2020-12-29 DIAGNOSIS — Z930 Tracheostomy status: Secondary | ICD-10-CM | POA: Insufficient documentation

## 2020-12-29 DIAGNOSIS — Z95828 Presence of other vascular implants and grafts: Secondary | ICD-10-CM

## 2020-12-29 LAB — COMPREHENSIVE METABOLIC PANEL
ALT: 15 U/L (ref 0–44)
AST: 19 U/L (ref 15–41)
Albumin: 4.1 g/dL (ref 3.5–5.0)
Alkaline Phosphatase: 70 U/L (ref 38–126)
Anion gap: 13 (ref 5–15)
BUN: 20 mg/dL (ref 8–23)
CO2: 26 mmol/L (ref 22–32)
Calcium: 8.9 mg/dL (ref 8.9–10.3)
Chloride: 102 mmol/L (ref 98–111)
Creatinine, Ser: 0.79 mg/dL (ref 0.44–1.00)
GFR, Estimated: >60 mL/min (ref >60)
Glucose, Bld: 96 mg/dL (ref 70–99)
Potassium: 4.1 mmol/L (ref 3.5–5.1)
Sodium: 141 mmol/L (ref 135–145)
Total Bilirubin: 0.4 mg/dL (ref 0.3–1.2)
Total Protein: 7.4 g/dL (ref 6.5–8.1)

## 2020-12-29 LAB — CBC WITH DIFFERENTIAL/PLATELET
Abs Immature Granulocytes: 0.01 10*3/uL (ref 0.00–0.07)
Basophils Absolute: 0 10*3/uL (ref 0.0–0.1)
Basophils Relative: 0 %
Eosinophils Absolute: 0.2 10*3/uL (ref 0.0–0.5)
Eosinophils Relative: 4 %
HCT: 40.4 % (ref 36.0–46.0)
Hemoglobin: 13.4 g/dL (ref 12.0–15.0)
Immature Granulocytes: 0 %
Lymphocytes Relative: 26 %
Lymphs Abs: 1.2 10*3/uL (ref 0.7–4.0)
MCH: 29.5 pg (ref 26.0–34.0)
MCHC: 33.2 g/dL (ref 30.0–36.0)
MCV: 88.8 fL (ref 80.0–100.0)
Monocytes Absolute: 0.5 10*3/uL (ref 0.1–1.0)
Monocytes Relative: 12 %
Neutro Abs: 2.6 10*3/uL (ref 1.7–7.7)
Neutrophils Relative %: 58 %
Platelets: 223 10*3/uL (ref 150–400)
RBC: 4.55 MIL/uL (ref 3.87–5.11)
RDW: 12.8 % (ref 11.5–15.5)
WBC: 4.5 10*3/uL (ref 4.0–10.5)
nRBC: 0 % (ref 0.0–0.2)

## 2020-12-29 LAB — TSH: TSH: 5.307 u[IU]/mL — ABNORMAL HIGH (ref 0.308–3.960)

## 2020-12-29 MED ORDER — SODIUM CHLORIDE 0.9 % IV SOLN
200.0000 mg | Freq: Once | INTRAVENOUS | Status: AC
  Administered 2020-12-29: 200 mg via INTRAVENOUS
  Filled 2020-12-29: qty 8

## 2020-12-29 MED ORDER — SODIUM CHLORIDE 0.9 % IV SOLN
Freq: Once | INTRAVENOUS | Status: AC
  Filled 2020-12-29: qty 250

## 2020-12-29 MED ORDER — HEPARIN SOD (PORK) LOCK FLUSH 100 UNIT/ML IV SOLN
500.0000 [IU] | Freq: Once | INTRAVENOUS | Status: AC | PRN
  Administered 2020-12-29: 500 [IU]
  Filled 2020-12-29: qty 5

## 2020-12-29 MED ORDER — SODIUM CHLORIDE 0.9% FLUSH
10.0000 mL | INTRAVENOUS | Status: DC | PRN
  Administered 2020-12-29: 10 mL
  Filled 2020-12-29: qty 10

## 2020-12-29 MED ORDER — SODIUM CHLORIDE 0.9% FLUSH
10.0000 mL | Freq: Once | INTRAVENOUS | Status: AC
  Administered 2020-12-29: 10 mL
  Filled 2020-12-29: qty 10

## 2020-12-29 MED ORDER — HYDROCODONE-ACETAMINOPHEN 5-325 MG PO TABS
1.0000 | ORAL_TABLET | Freq: Four times a day (QID) | ORAL | 0 refills | Status: DC | PRN
  Filled 2020-12-29: qty 60, 15d supply, fill #0

## 2020-12-29 NOTE — Progress Notes (Signed)
[place] OFFICE PROGRESS NOTE  Patient Care Team: Hamrick, Lorin Mercy, MD as PCP - General (Family Medicine) Heath Lark, MD as Consulting Physician (Hematology and Oncology)  ASSESSMENT & PLAN:  Laryngeal squamous cell carcinoma [facility]) She is doing very well I recommend we delay her next CT until May We will continue immunotherapy indefinitely  Cancer associated pain Her pain is less since recent dental extraction I plan to recommend pain medicine taper in the future  Protein malnutrition ([place]) Her feeding tube was recently replaced The patient stated she can swallow liquids I recommend a trial of oral intake of liquid nutrition with the plan to wean her off feeding tube She is interested to try I will consult dietitian for recommendations but I recommend her to start with Ensure and milk shakes and to document her intake   No orders of the defined types were placed in this encounter.   All questions were answered. The patient knows to call the clinic with any problems, questions or concerns. The total time spent in the appointment was 20 minutes encounter with patients including review of chart and various tests results, discussions about plan of care and coordination of care plan   Heath Lark, MD 12/29/2020 9:07 AM  INTERVAL HISTORY: Please see below for problem oriented charting. She returns with her son for further follow-up She is doing well Her pain is less Her feeding tube is working Denies recent side effects from treatment such as nausea  SUMMARY OF ONCOLOGIC HISTORY: Oncology History  Laryngeal squamous cell carcinoma ([place])  12/02/2018 Imaging   CT neck: IMPRESSION: 1. Supraglottic laryngeal carcinoma extending across the anterior commissure and invading the prelaryngeal fat, left aryepiglottic fold and left aspect of the epiglottis. 2. Severe narrowing of the laryngeal airway. 3. Bilateral subcentimeter level 2A and 2A cervical lymph nodes.      12/02/2018 Imaging   CT  Chest:  IMPRESSION: No evidence of metastatic disease in the chest.   12/03/2018 Pathology Results   (Care Everywhere; [facility]) A: Larynx, supraglottis, biopsy - Atypical squamous proliferation with at least low grade dysplasia and abundant keratinization - No definite high grade dysplasia or invasive carcinoma represented (see comment)   12/18/2018 Pathology Results   (Care Everywhere; [facility]) A:  Neck, left, fine needle aspiration - Rare single atypical cells, no cells diagnostic of malignancy - Lymph node material present  B:  Neck, right, fine needle aspiration - Non-diagnostic specimen - No lymph node material identified - Mixed inflammation and blood   12/31/2018 Pathology Results   (Care Everywhere; [facility])  A: Larynx, left supraglottic mass, biopsy - Atypical squamous proliferation with abundant keratinization, suspicious for well-differentiated invasive squamous cell carcinoma  B: Larynx, left supraglottic mass, biopsy - Well-differentiated invasive squamous cell carcinoma with abundant keratinization (see comment)   01/18/2019 Initial Diagnosis   Laryngeal cancer ([place])   01/26/2019 Imaging   1. Locally advanced hypermetabolic laryngeal neoplasm centered in the left supraglottic space with bilateral glottic and epiglottic involvement and subglottic extension on the left. 2. Hypermetabolic bilateral level 3 neck nodal metastases. 3. Hypermetabolic high mediastinal nodal metastases between the trachea and upper thoracic esophagus. 4. Otherwise no distant hypermetabolic metastatic disease. 5. Nodular 7 mm focus at the left lung base, below PET resolution, recommend attention on follow-up chest CT in 3 months. 6. Chronic findings include: Aortic Atherosclerosis (ICD10-I70.0). Cholelithiasis.   02/08/2019 Cancer Staging   Staging form: Larynx - Supraglottis, AJCC 8th Edition - Clinical stage from  02/08/2019: Stage IVA (cT3, cN2c, cM0)  - Signed by Eppie Gibson, MD on 02/08/2019   02/11/2019 - 04/07/2019 Chemotherapy   The patient weekly cisplatin for chemotherapy treatment.     07/20/2019 Pathology Results   PROCEDURE: Total glossectomy, total pharyngectomy, total laryngectomy, total thyroidectomy, and cervical esophagectomy TUMOR SITE: Larynx TUMOR LATERALITY: Not specified TUMOR FOCALITY: Unifocal TUMOR SIZE:    GREATEST DIMENSION: 6.9 cm    ADDITIONAL DIMENSIONS: 5.4 x 2.4 cm HISTOLOGIC TYPE: Squamous cell carcinoma, conventional (keratinizing) HISTOLOGIC GRADE:  G1-G2: Well to moderately differentiated TUMOR EXTENSION: Tumor involves the posterior tongue, pharynx, larynx, tracheal cartilage, hyoid bone, cervical esophagus, vagus nerve, carotid artery, deep cervical fascia, ans surrounding skeletal muscle and soft tissue MARGINS: Uninvolved by invasive tumor; Perineural invasion is less than 38mm from the left soft tissue margin LYMPHOVASCULAR INVASION: Not identified PERINEURAL INVASION:  Present REGIONAL LYMPH NODES:     NUMBER OF LYMPH NODES INVOLVED: 2     NUMBER OF LYMPH NODES EXAMINED: 24     LATERALITY OF LYMPH NODES INVOLVED: Cannot determine     SIZE OF LARGEST METASTATIC DEPOSIT: 0.8 cm     EXTRANODAL EXTENSION: Not identified PATHOLOGIC STAGE CLASSIFICATION (pTNM, AJCC 8TH Ed): (r)(y) pT4b pN2a DISTANT METASTASIS (pM): ADDITIONAL PATHOLOGIC FINDINGS: Previous chemoradiation   11/04/2019 Imaging   PET: 1.  Hypermetabolic bilateral neck lesions as detailed above are concerning for disease recurrence. 2.  Compared to 07/08/2019 PET scan, interval development of subcentimeter bilateral upper lobe pulmonary nodules, size below PET resolution. These are concerning for metastasis. Consider diagnostic quality CT chest exam for further evaluation as per oncology treatment planning. 3.  Focal hypermetabolic uptake in right ventricular cavity of indeterminate significance. Recommend echocardiographic  examination to rule out underlying thrombus. This finding was discussed with Dr. Conley Canal by Dr. Clementeen Graham and Dr. Marcello Moores on 11/04/2019 at approximately 3:00 PM via telephone. 4.  Ancillary CT findings as above.   11/05/2019 Imaging   Echocardiogram: Summary   1. The left ventricle is normal in size with normal wall thickness.   2. The left ventricular systolic function is normal, LVEF is visually estimated at 60-65%.   3. The right ventricle is normal in size, with normal systolic function.   4. There is an extrinsic compression of the RV and RA laterally at the AV groove by a mass of 4.3 cm diameter. Unclear if the mass is invasive, although no pericardial effusion seen suggestive no invasion possibly. There is no evidence of hemodynamic compromise secondary to cardiac compression with normal IVC diameter.   12/06/2019 Pathology Results   A. LYMPH NODE, LEFT SUBMANDIBULAR, BIOPSY:  - Squamous cell carcinoma.  - No distinct nodal tissue identified.    12/06/2019 Procedure   Successful ultrasound-guided core biopsy of centrally necrotic left submandibular lymph node. Of note, due to extensive prior postsurgical and post radiation changes, patient has very limited mobility in the neck and the biopsy was extremely challenging    12/16/2019 -  Chemotherapy   The patient had pembrolizumab for chemotherapy treatment.     03/08/2020 PET scan   1. Diminished size of masses in the neck as described. Persistent increased metabolic activity particularly in the LEFT neck. Some of this diminished size on the LEFT could be due to decompression of necrotic material. Correlate with any symptoms of inflammation or infection in this area as well given the paucity of residual soft tissue associated with the above abnormality, overall findings are compatible with residual disease in this location and there  is no significant surrounding stranding to indicate inflammation that would correlate with the degree of FDG  uptake remaining. 2. Diminished activity in the juxta cardiac mass with peripheral activity suggesting interval necrosis, on the prior study this was uniformly hypermetabolic. There is still considerable FDG uptake. 3. No signs of disease in the abdomen or pelvis. 4. 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 Correlate with any new pain or symptoms in this area with further imaging as warranted. No discrete mass or bone destruction on today's study.   06/08/2020 Imaging   CT neck 1. Status post laryngectomy and neck dissection without lymphadenopathy or mass lesion. 2. Short segment severe stenosis of the left internal carotid artery adjacent to a surgical clip.     06/08/2020 Imaging   CT chest 1. There is a large, hypodense lesion within, or very closely abutting and likely intruding upon the right ventricle, as seen on prior examination and approximately 4.6 x 4.3 cm, poorly evaluated due to cardiac motion. Findings are consistent with metastatic lesion and/or thrombus and poorly assessed by ungated CT. Consider echocardiography and cardiac MRI for further assessment.   2. Multiple small pulmonary nodules in the bilateral lung apices are unchanged. Attention on follow-up.   3.  No evidence of new metastatic disease in the chest.   4.  Aortic Atherosclerosis (ICD10-I70.0).     09/07/2020 Imaging   1. Mild decrease in size of juxta cardiac metastatic involving the right ventricle. 2. Small pulmonary nodules are unchanged from previous exam. 3. Aortic atherosclerosis. 4. Gallstone.     12/14/2020 Procedure   Successful exchange and up size from 20 French pull-through gastrostomy to 22 French balloon retention gastrostomy.     REVIEW OF SYSTEMS:   Constitutional: Denies fevers, chills or abnormal weight loss Eyes: Denies blurriness of vision Ears, nose, mouth, throat, and face: Denies mucositis or sore throat Respiratory: Denies cough, dyspnea or wheezes Cardiovascular: Denies  palpitation, chest discomfort or lower extremity swelling Gastrointestinal:  Denies nausea, heartburn or change in bowel habits Skin: Denies abnormal skin rashes Lymphatics: Denies new lymphadenopathy or easy bruising Neurological:Denies numbness, tingling or new weaknesses Behavioral/Psych: Mood is stable, no new changes  All other systems were reviewed with the patient and are negative.  I have reviewed the past medical history, past surgical history, social history and family history with the patient and they are unchanged from previous note.  ALLERGIES:  has No Known Allergies.  MEDICATIONS:  Current Outpatient Medications  Medication Sig Dispense Refill  . chlorhexidine (PERIDEX) 0.12 % solution Use as directed 15 mLs in the mouth or throat 2 (two) times daily. Rinse with 15 mls twice daily for 30 seconds. Use after breakfast and at bedtime. Spit out excess. Do not swallow. 473 mL 2  . Cholecalciferol (VITAMIN D3) 125 MCG (5000 UT) CAPS Take 5,000 Units by mouth daily.    Marland Kitchen HYDROcodone-acetaminophen (NORCO/VICODIN) 5-325 MG tablet TAKE 1 TABLET BY MOUTH EVERY 6 HOURS AS NEEDED FOR MODERATE PAIN 60 tablet 0  . levothyroxine (SYNTHROID) 137 MCG tablet Take 137 mcg by mouth daily before breakfast.    . lidocaine-prilocaine (EMLA) cream Apply to affected area once (Patient taking differently: Apply 1 application topically daily as needed (port access). Apply to affected area once) 30 g 3  . Nutritional Supplements (KATE [employer]AR[employer] PEPTIDE 1.5) LIQD Take 4 Bottles by mouth daily. 325 mL 11  . ondansetron (ZOFRAN) 8 MG tablet Take 1 tablet (8 mg total) by mouth 2 (two) times daily as needed (  Nausea or vomiting). 30 tablet 1  . prochlorperazine (COMPAZINE) 10 MG tablet Take 1 tablet (10 mg total) by mouth every 6 (six) hours as needed (Nausea or vomiting). 30 tablet 1  . sodium fluoride (PREVIDENT 5000 PLUS) 1.1 % CREA dental cream Apply cream to tooth brush. Brush teeth for 2 minutes. Spit out  excess. DO NOT rinse afterwards. Repeat nightly. (Patient taking differently: Place 1 application onto teeth 2 (two) times a day.) 1 Tube prn   No current facility-administered medications for this visit.    PHYSICAL EXAMINATION: ECOG PERFORMANCE STATUS: 1 - Symptomatic but completely ambulatory  Vitals:   12/29/20 0833  BP: (!) 120/53  Pulse: 79  Resp: 20  Temp: 97.8 F (36.6 C)  SpO2: 97%   Filed Weights   12/29/20 0833  Weight: 138 lb 12.8 oz (63 kg)    GENERAL:alert, no distress and comfortable SKIN: skin color, texture, turgor are normal, no rashes or significant lesions EYES: normal, Conjunctiva are pink and non-injected, sclera clear OROPHARYNX:no exudate, no erythema and lips, buccal mucosa, and tongue normal.  No oral thrush NECK: Tracheostomy in situ LYMPH:  no palpable lymphadenopathy in the cervical, axillary or inguinal LUNGS: clear to auscultation and percussion with normal breathing effort HEART: regular rate & rhythm and no murmurs and no lower extremity edema ABDOMEN:abdomen soft, non-tender and normal bowel sounds.  Feeding tube looks okay Musculoskeletal:no cyanosis of digits and no clubbing  NEURO: alert & oriented x 3, no focal motor/sensory deficits  LABORATORY DATA:  I have reviewed the data as listed    Component Value Date/Time   NA 139 12/01/2020 1033   K 4.2 12/01/2020 1033   CL 104 12/01/2020 1033   CO2 26 12/01/2020 1033   GLUCOSE 97 12/01/2020 1033   BUN 23 12/01/2020 1033   CREATININE 0.72 12/01/2020 1033   CREATININE 0.60 02/17/2020 1128   CALCIUM 8.8 (L) 12/01/2020 1033   PROT 7.0 12/01/2020 1033   ALBUMIN 3.8 12/01/2020 1033   AST 19 12/01/2020 1033   AST 22 02/17/2020 1128   ALT 15 12/01/2020 1033   ALT 24 02/17/2020 1128   ALKPHOS 69 12/01/2020 1033   BILITOT <0.2 (L) 12/01/2020 1033   BILITOT 0.5 02/17/2020 1128   GFRNONAA >60 12/01/2020 1033   GFRNONAA >60 02/17/2020 1128   GFRAA >60 06/08/2020 1303   GFRAA >60  02/17/2020 1128    No results found for: SPEP, UPEP  Lab Results  Component Value Date   WBC 4.5 12/29/2020   NEUTROABS 2.6 12/29/2020   HGB 13.4 12/29/2020   HCT 40.4 12/29/2020   MCV 88.8 12/29/2020   PLT 223 12/29/2020      Chemistry      Component Value Date/Time   NA 139 12/01/2020 1033   K 4.2 12/01/2020 1033   CL 104 12/01/2020 1033   CO2 26 12/01/2020 1033   BUN 23 12/01/2020 1033   CREATININE 0.72 12/01/2020 1033   CREATININE 0.60 02/17/2020 1128      Component Value Date/Time   CALCIUM 8.8 (L) 12/01/2020 1033   ALKPHOS 69 12/01/2020 1033   AST 19 12/01/2020 1033   AST 22 02/17/2020 1128   ALT 15 12/01/2020 1033   ALT 24 02/17/2020 1128   BILITOT <0.2 (L) 12/01/2020 1033   BILITOT 0.5 02/17/2020 1128       RADIOGRAPHIC STUDIES: I have personally reviewed the radiological images as listed and agreed with the findings in the report. IR Replc Gastro/Colonic Tube Percut W/Fluoro  Result Date: 12/15/2020 INDICATION: 70 year old female with history of laryngeal squamous cell carcinoma and chronic indwelling gastrostomy tube presenting with multiple holes in the exterior portion of the gastrostomy tube rendering it useless. EXAM: GASTROSTOMY CATHETER REPLACEMENT MEDICATIONS: None. ANESTHESIA/SEDATION: None. CONTRAST:  24mL OMNIPAQUE IOHEXOL 300 MG/ML SOLN - administered into the gastric lumen. FLUOROSCOPY TIME:  Fluoroscopy Time: 0 minutes 24 seconds (5 mGy). COMPLICATIONS: None immediate. PROCEDURE: Informed written consent was obtained from the patient after a thorough discussion of the procedural risks, benefits and alternatives. All questions were addressed. Maximal Sterile Barrier Technique was utilized including caps, mask, sterile gowns, sterile gloves, sterile drape, hand hygiene and skin antiseptic. A timeout was performed prior to the initiation of the procedure. Viscous lidocaine was applied along the indwelling gastrostomy tract. Injection of contrast via the  gastrostomy demonstrated appropriate positioning within the stomach. An Amplatz wire was inserted through the gastrostomy and coiled within the stomach. The gastrostomy tube was then removed over the wire. A new, 22 French gastrostomy tube was inserted over the wire. Hand injection of contrast confirmed position within the stomach. The balloon was insufflated with 20 mL of sterile water and gently retracted to the anterior wall of the stomach. The bumper was cinched to the level of the skin. Hand injection of contrast confirmed appropriate position. The gastrostomy tube was then flushed with water. A bandage was applied. The patient tolerated the procedure well. IMPRESSION: Successful exchange and up size from 20 French pull-through gastrostomy to 22 French balloon retention gastrostomy. Ruthann Cancer, MD [facility] Radiology Electronically Signed   By: Ruthann Cancer MD   On: 12/15/2020 11:31

## 2020-12-29 NOTE — Assessment & Plan Note (Signed)
Her feeding tube was recently replaced The patient stated she can swallow liquids I recommend a trial of oral intake of liquid nutrition with the plan to wean her off feeding tube She is interested to try I will consult dietitian for recommendations but I recommend her to start with Ensure and milk shakes and to document her intake

## 2020-12-29 NOTE — Patient Instructions (Signed)
[facility] Discharge Instructions for Patients Receiving Chemotherapy  Today you received the following chemotherapy agents: pembrolizumab.  To help prevent nausea and vomiting after your treatment, we encourage you to take your nausea medication as directed.   If you develop nausea and vomiting that is not controlled by your nausea medication, call the clinic.   BELOW ARE SYMPTOMS THAT SHOULD BE REPORTED IMMEDIATELY:  *FEVER GREATER THAN 100.5 F  *CHILLS WITH OR WITHOUT FEVER  NAUSEA AND VOMITING THAT IS NOT CONTROLLED WITH YOUR NAUSEA MEDICATION  *UNUSUAL SHORTNESS OF BREATH  *UNUSUAL BRUISING OR BLEEDING  TENDERNESS IN MOUTH AND THROAT WITH OR WITHOUT PRESENCE OF ULCERS  *URINARY PROBLEMS  *BOWEL PROBLEMS  UNUSUAL RASH Items with * indicate a potential emergency and should be followed up as soon as possible.  Feel free to call the clinic should you have any questions or concerns. The clinic phone number is [phone].  Please show the CHEMO ALERT CARD at check-in to the Emergency Department and triage nurse.   

## 2020-12-29 NOTE — Assessment & Plan Note (Signed)
She is doing very well I recommend we delay her next CT until May We will continue immunotherapy indefinitely

## 2020-12-29 NOTE — Assessment & Plan Note (Signed)
Her pain is less since recent dental extraction I plan to recommend pain medicine taper in the future

## 2021-01-02 ENCOUNTER — Telehealth: Payer: Self-pay | Admitting: Nutrition

## 2021-01-02 NOTE — Telephone Encounter (Signed)
Scheduled appt per 4/8 sch msg. Pt aware.

## 2021-01-04 ENCOUNTER — Inpatient Hospital Stay: Payer: Medicare HMO | Admitting: Nutrition

## 2021-01-04 ENCOUNTER — Other Ambulatory Visit: Payer: Self-pay

## 2021-01-04 DIAGNOSIS — Z430 Encounter for attention to tracheostomy: Secondary | ICD-10-CM | POA: Diagnosis not present

## 2021-01-04 NOTE — Progress Notes (Signed)
Nutrition follow-up completed with patient diagnosed with recurrent laryngeal cancer receiving Keytruda.  MD requesting patient education on full liquid diet by mouth.  If oral intake tolerated, will be able to decrease and hopefully discontinue tube feedings.  She denies nausea, vomiting, constipation, diarrhea. Weight is stable and documented as 138.8 pounds on April 8. Labs were reviewed. Patient had her feeding tube recently replaced. She reports she can tolerate 8 ounces of water by mouth over the course of the day.  She has experimented with chicken broth and boost by mouth with some success.  She denies coughing however reports it has been difficult swallowing. She is using 5 cartons of [employer] 1.5 peptide via feeding tube +2 boost plus via tube daily.  This provides 3220 cal, 146 g protein.  This is 146% maximum estimated calorie needs.  However it has contributed to weight stabilization so will not change overall recommendations for total calories and protein.  Nutrition diagnosis: Food and nutrition related knowledge deficit related to recurrent laryngeal cancer and associated treatments as evidenced by no prior need for nutrition related information.  Intervention: Substitute 1 carton [employer] 1.5 peptide to be given by mouth and continue with 4 cartons [employer] 1.5 and 2 cartons boost plus via feeding tube.  Work to increase free water sips throughout the day.  Educated patient on other liquids that she could experiment with.  Requested she keep a food journal documenting what she is eating and in appropriate amounts and tolerance.  Will request additional [employer] oral nutrition supplements to be delivered to patient's home.  Monitoring, evaluation, goals: Patient will tolerate gradual increase in oral intake while decreasing tube feeding and maintaining current weight.  Monitor ability to swallow.  May need referral to speech therapy for review of swallowing exercises.  Next  visit: Friday, April 29 during infusion.  **Disclaimer: This note was dictated with voice recognition software. Similar sounding words can inadvertently be transcribed and this note may contain transcription errors which may not have been corrected upon publication of note.**

## 2021-01-05 DIAGNOSIS — C321 Malignant neoplasm of supraglottis: Secondary | ICD-10-CM | POA: Diagnosis not present

## 2021-01-05 DIAGNOSIS — R633 Feeding difficulties, unspecified: Secondary | ICD-10-CM | POA: Diagnosis not present

## 2021-01-19 ENCOUNTER — Encounter: Payer: Self-pay | Admitting: Hematology and Oncology

## 2021-01-19 ENCOUNTER — Inpatient Hospital Stay (HOSPITAL_BASED_OUTPATIENT_CLINIC_OR_DEPARTMENT_OTHER): Payer: Medicare HMO | Admitting: Hematology and Oncology

## 2021-01-19 ENCOUNTER — Telehealth: Payer: Self-pay | Admitting: Hematology and Oncology

## 2021-01-19 ENCOUNTER — Inpatient Hospital Stay: Payer: Medicare HMO

## 2021-01-19 ENCOUNTER — Other Ambulatory Visit: Payer: Medicare HMO

## 2021-01-19 ENCOUNTER — Other Ambulatory Visit: Payer: Self-pay

## 2021-01-19 ENCOUNTER — Inpatient Hospital Stay: Payer: Medicare HMO | Admitting: Dietician

## 2021-01-19 DIAGNOSIS — C329 Malignant neoplasm of larynx, unspecified: Secondary | ICD-10-CM

## 2021-01-19 DIAGNOSIS — Z930 Tracheostomy status: Secondary | ICD-10-CM | POA: Diagnosis not present

## 2021-01-19 DIAGNOSIS — Z79899 Other long term (current) drug therapy: Secondary | ICD-10-CM | POA: Diagnosis not present

## 2021-01-19 DIAGNOSIS — G893 Neoplasm related pain (acute) (chronic): Secondary | ICD-10-CM

## 2021-01-19 DIAGNOSIS — R1310 Dysphagia, unspecified: Secondary | ICD-10-CM | POA: Diagnosis not present

## 2021-01-19 DIAGNOSIS — Z923 Personal history of irradiation: Secondary | ICD-10-CM | POA: Diagnosis not present

## 2021-01-19 DIAGNOSIS — E46 Unspecified protein-calorie malnutrition: Secondary | ICD-10-CM | POA: Diagnosis not present

## 2021-01-19 DIAGNOSIS — C321 Malignant neoplasm of supraglottis: Secondary | ICD-10-CM | POA: Diagnosis not present

## 2021-01-19 DIAGNOSIS — Z5112 Encounter for antineoplastic immunotherapy: Secondary | ICD-10-CM | POA: Diagnosis not present

## 2021-01-19 DIAGNOSIS — E039 Hypothyroidism, unspecified: Secondary | ICD-10-CM

## 2021-01-19 DIAGNOSIS — Z87891 Personal history of nicotine dependence: Secondary | ICD-10-CM | POA: Diagnosis not present

## 2021-01-19 LAB — COMPREHENSIVE METABOLIC PANEL
ALT: 17 U/L (ref 0–44)
AST: 21 U/L (ref 15–41)
Albumin: 4 g/dL (ref 3.5–5.0)
Alkaline Phosphatase: 75 U/L (ref 38–126)
Anion gap: 9 (ref 5–15)
BUN: 22 mg/dL (ref 8–23)
CO2: 28 mmol/L (ref 22–32)
Calcium: 9.2 mg/dL (ref 8.9–10.3)
Chloride: 101 mmol/L (ref 98–111)
Creatinine, Ser: 0.74 mg/dL (ref 0.44–1.00)
GFR, Estimated: >60 mL/min (ref >60)
Glucose, Bld: 96 mg/dL (ref 70–99)
Potassium: 4.1 mmol/L (ref 3.5–5.1)
Sodium: 138 mmol/L (ref 135–145)
Total Bilirubin: 0.3 mg/dL (ref 0.3–1.2)
Total Protein: 7.4 g/dL (ref 6.5–8.1)

## 2021-01-19 LAB — CBC WITH DIFFERENTIAL/PLATELET
Abs Immature Granulocytes: 0.02 10*3/uL (ref 0.00–0.07)
Basophils Absolute: 0 10*3/uL (ref 0.0–0.1)
Basophils Relative: 0 %
Eosinophils Absolute: 0.4 10*3/uL (ref 0.0–0.5)
Eosinophils Relative: 7 %
HCT: 41 % (ref 36.0–46.0)
Hemoglobin: 13.3 g/dL (ref 12.0–15.0)
Immature Granulocytes: 0 %
Lymphocytes Relative: 24 %
Lymphs Abs: 1.1 10*3/uL (ref 0.7–4.0)
MCH: 29 pg (ref 26.0–34.0)
MCHC: 32.4 g/dL (ref 30.0–36.0)
MCV: 89.3 fL (ref 80.0–100.0)
Monocytes Absolute: 0.6 10*3/uL (ref 0.1–1.0)
Monocytes Relative: 12 %
Neutro Abs: 2.7 10*3/uL (ref 1.7–7.7)
Neutrophils Relative %: 57 %
Platelets: 237 10*3/uL (ref 150–400)
RBC: 4.59 MIL/uL (ref 3.87–5.11)
RDW: 13.1 % (ref 11.5–15.5)
WBC: 4.7 10*3/uL (ref 4.0–10.5)
nRBC: 0 % (ref 0.0–0.2)

## 2021-01-19 LAB — TSH: TSH: 3.122 u[IU]/mL (ref 0.308–3.960)

## 2021-01-19 MED ORDER — HEPARIN SOD (PORK) LOCK FLUSH 100 UNIT/ML IV SOLN
500.0000 [IU] | Freq: Once | INTRAVENOUS | Status: AC | PRN
  Administered 2021-01-19: 500 [IU]
  Filled 2021-01-19: qty 5

## 2021-01-19 MED ORDER — PEGFILGRASTIM-JMDB 6 MG/0.6ML ~~LOC~~ SOSY
PREFILLED_SYRINGE | SUBCUTANEOUS | Status: AC
  Filled 2021-01-19: qty 0.6

## 2021-01-19 MED ORDER — SODIUM CHLORIDE 0.9 % IV SOLN
200.0000 mg | Freq: Once | INTRAVENOUS | Status: AC
  Administered 2021-01-19: 200 mg via INTRAVENOUS
  Filled 2021-01-19: qty 8

## 2021-01-19 MED ORDER — SODIUM CHLORIDE 0.9 % IV SOLN
Freq: Once | INTRAVENOUS | Status: AC
  Filled 2021-01-19: qty 250

## 2021-01-19 MED ORDER — SODIUM CHLORIDE 0.9% FLUSH
10.0000 mL | INTRAVENOUS | Status: DC | PRN
  Administered 2021-01-19: 10 mL
  Filled 2021-01-19: qty 10

## 2021-01-19 NOTE — Progress Notes (Signed)
Nutrition Follow-up:  Patient with recurrent laryngeal cancer receiving Keytruda.   Met with patient in infusion. She reports doing well today and hopeful of having tube removed at next MD visit. Patient reports drinking 4 [employer] 1.5 and 2 Boost Plus by mouth. She has increased oral intake of water, reports 5 (8oz) bottles daily. She has tried cream of wheat, but did not like it, reports cream of chicken soup was okay. Her daughter mailed her [employer]	[place] from [place], she is excited to try them. Patient continues to give medications via PEG, flushes with 60 ml water before and after medications TID. She is using baking soda/salt water rinse approximately 4 times daily after eating. Patient denies nausea, vomiting, diarrhea, constipation. She reports occasionally phlegm.  Patient is looking forward to cooking and enjoying more foods by mouth.   Medications: reviewed  Labs: reviewed   Anthropometrics: Weight 143 lb 12.8 oz today increased 5 lbs from 138 lb 8 oz on 4/8   NUTRITION DIAGNOSIS: Food an nutrition related knowledge deficit improving   INTERVENTION:  Congratulated patient on increased oral intake  Continue drinking [employer] 1.5 peptide - 4 cartons daily and 2 cartons boost plus by mouth Encouraged working to increase water intake by mouth Encouraged trying other liquids, offered suggestions Patient politely declined Ensure coupons today  MONITORING, EVALUATION, GOAL: weight trends, intake   NEXT VISIT: Friday May 20

## 2021-01-19 NOTE — Assessment & Plan Note (Signed)
She has chronic dysphagia due to radiation induced fibrosis She is able to swallow nutritional supplement but not normal food She is interested to try to swallow or her nutritional supplement and have PEG tube removed in the future She will see dietitian for further follow-up

## 2021-01-19 NOTE — Telephone Encounter (Signed)
Scheduled per sch msg. Gave avs and calendar

## 2021-01-19 NOTE — Assessment & Plan Note (Signed)
She is able to reduce the amount of pain medicine required She will continue pain medicine taper

## 2021-01-19 NOTE — Assessment & Plan Note (Signed)
She is doing very well without major side effects from I recommend we delay her next CT until May We will continue immunotherapy indefinitely

## 2021-01-19 NOTE — Progress Notes (Signed)
[facility] OFFICE PROGRESS NOTE  Patient Care Team: Hamrick, Lorin Mercy, MD as PCP - General (Family Medicine) Heath Lark, MD as Consulting Physician (Hematology and Oncology)  ASSESSMENT & PLAN:  Laryngeal squamous cell carcinoma [facility]) She is doing very well without major side effects from I recommend we delay her next CT until May We will continue immunotherapy indefinitely  Cancer associated pain She is able to reduce the amount of pain medicine required She will continue pain medicine taper  Dysphagia She has chronic dysphagia due to radiation induced fibrosis She is able to swallow nutritional supplement but not normal food She is interested to try to swallow or her nutritional supplement and have PEG tube removed in the future She will see dietitian for further follow-up   No orders of the defined types were placed in this encounter.   All questions were answered. The patient knows to call the clinic with any problems, questions or concerns. The total time spent in the appointment was 20 minutes encounter with patients including review of chart and various tests results, discussions about plan of care and coordination of care plan   Heath Lark, MD 01/19/2021 11:17 AM  INTERVAL HISTORY: Please see below for problem oriented charting. She returns by herself We communicated through handwritten notes She is doing well She has minimum pain She attempted to swallow food but have chronic dysphagia She is able to swallow nutritional supplement and water without difficulties  SUMMARY OF ONCOLOGIC HISTORY: Oncology History  Laryngeal squamous cell carcinoma ([place])  12/02/2018 Imaging   CT neck: IMPRESSION: 1. Supraglottic laryngeal carcinoma extending across the anterior commissure and invading the prelaryngeal fat, left aryepiglottic fold and left aspect of the epiglottis. 2. Severe narrowing of the laryngeal airway. 3. Bilateral subcentimeter level 2A and 2A  cervical lymph nodes.     12/02/2018 Imaging   CT  Chest:  IMPRESSION: No evidence of metastatic disease in the chest.   12/03/2018 Pathology Results   (Care Everywhere; [facility]) A: Larynx, supraglottis, biopsy - Atypical squamous proliferation with at least low grade dysplasia and abundant keratinization - No definite high grade dysplasia or invasive carcinoma represented (see comment)   12/18/2018 Pathology Results   (Care Everywhere; [facility]) A:  Neck, left, fine needle aspiration - Rare single atypical cells, no cells diagnostic of malignancy - Lymph node material present  B:  Neck, right, fine needle aspiration - Non-diagnostic specimen - No lymph node material identified - Mixed inflammation and blood   12/31/2018 Pathology Results   (Care Everywhere; [facility])  A: Larynx, left supraglottic mass, biopsy - Atypical squamous proliferation with abundant keratinization, suspicious for well-differentiated invasive squamous cell carcinoma  B: Larynx, left supraglottic mass, biopsy - Well-differentiated invasive squamous cell carcinoma with abundant keratinization (see comment)   01/18/2019 Initial Diagnosis   Laryngeal cancer ([place])   01/26/2019 Imaging   1. Locally advanced hypermetabolic laryngeal neoplasm centered in the left supraglottic space with bilateral glottic and epiglottic involvement and subglottic extension on the left. 2. Hypermetabolic bilateral level 3 neck nodal metastases. 3. Hypermetabolic high mediastinal nodal metastases between the trachea and upper thoracic esophagus. 4. Otherwise no distant hypermetabolic metastatic disease. 5. Nodular 7 mm focus at the left lung base, below PET resolution, recommend attention on follow-up chest CT in 3 months. 6. Chronic findings include: Aortic Atherosclerosis (ICD10-I70.0). Cholelithiasis.   02/08/2019 Cancer Staging   Staging form: Larynx - Supraglottis, AJCC 8th Edition - Clinical stage from 02/08/2019:  Stage IVA (  cT3, cN2c, cM0) - Signed by Eppie Gibson, MD on 02/08/2019   02/11/2019 - 04/07/2019 Chemotherapy   The patient weekly cisplatin for chemotherapy treatment.     07/20/2019 Pathology Results   PROCEDURE: Total glossectomy, total pharyngectomy, total laryngectomy, total thyroidectomy, and cervical esophagectomy TUMOR SITE: Larynx TUMOR LATERALITY: Not specified TUMOR FOCALITY: Unifocal TUMOR SIZE:    GREATEST DIMENSION: 6.9 cm    ADDITIONAL DIMENSIONS: 5.4 x 2.4 cm HISTOLOGIC TYPE: Squamous cell carcinoma, conventional (keratinizing) HISTOLOGIC GRADE:  G1-G2: Well to moderately differentiated TUMOR EXTENSION: Tumor involves the posterior tongue, pharynx, larynx, tracheal cartilage, hyoid bone, cervical esophagus, vagus nerve, carotid artery, deep cervical fascia, ans surrounding skeletal muscle and soft tissue MARGINS: Uninvolved by invasive tumor; Perineural invasion is less than 75mm from the left soft tissue margin LYMPHOVASCULAR INVASION: Not identified PERINEURAL INVASION:  Present REGIONAL LYMPH NODES:     NUMBER OF LYMPH NODES INVOLVED: 2     NUMBER OF LYMPH NODES EXAMINED: 24     LATERALITY OF LYMPH NODES INVOLVED: Cannot determine     SIZE OF LARGEST METASTATIC DEPOSIT: 0.8 cm     EXTRANODAL EXTENSION: Not identified PATHOLOGIC STAGE CLASSIFICATION (pTNM, AJCC 8TH Ed): (r)(y) pT4b pN2a DISTANT METASTASIS (pM): ADDITIONAL PATHOLOGIC FINDINGS: Previous chemoradiation   11/04/2019 Imaging   PET: 1.  Hypermetabolic bilateral neck lesions as detailed above are concerning for disease recurrence. 2.  Compared to 07/08/2019 PET scan, interval development of subcentimeter bilateral upper lobe pulmonary nodules, size below PET resolution. These are concerning for metastasis. Consider diagnostic quality CT chest exam for further evaluation as per oncology treatment planning. 3.  Focal hypermetabolic uptake in right ventricular cavity of indeterminate significance.  Recommend echocardiographic examination to rule out underlying thrombus. This finding was discussed with Dr. Conley Canal by Dr. Clementeen Graham and Dr. Marcello Moores on 11/04/2019 at approximately 3:00 PM via telephone. 4.  Ancillary CT findings as above.   11/05/2019 Imaging   Echocardiogram: Summary   1. The left ventricle is normal in size with normal wall thickness.   2. The left ventricular systolic function is normal, LVEF is visually estimated at 60-65%.   3. The right ventricle is normal in size, with normal systolic function.   4. There is an extrinsic compression of the RV and RA laterally at the AV groove by a mass of 4.3 cm diameter. Unclear if the mass is invasive, although no pericardial effusion seen suggestive no invasion possibly. There is no evidence of hemodynamic compromise secondary to cardiac compression with normal IVC diameter.   12/06/2019 Pathology Results   A. LYMPH NODE, LEFT SUBMANDIBULAR, BIOPSY:  - Squamous cell carcinoma.  - No distinct nodal tissue identified.    12/06/2019 Procedure   Successful ultrasound-guided core biopsy of centrally necrotic left submandibular lymph node. Of note, due to extensive prior postsurgical and post radiation changes, patient has very limited mobility in the neck and the biopsy was extremely challenging    12/16/2019 -  Chemotherapy   The patient had pembrolizumab for chemotherapy treatment.     03/08/2020 PET scan   1. Diminished size of masses in the neck as described. Persistent increased metabolic activity particularly in the LEFT neck. Some of this diminished size on the LEFT could be due to decompression of necrotic material. Correlate with any symptoms of inflammation or infection in this area as well given the paucity of residual soft tissue associated with the above abnormality, overall findings are compatible with residual disease in this location and there is no significant surrounding  stranding to indicate inflammation that would  correlate with the degree of FDG uptake remaining. 2. Diminished activity in the juxta cardiac mass with peripheral activity suggesting interval necrosis, on the prior study this was uniformly hypermetabolic. There is still considerable FDG uptake. 3. No signs of disease in the abdomen or pelvis. 4. 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 Correlate with any new pain or symptoms in this area with further imaging as warranted. No discrete mass or bone destruction on today's study.   06/08/2020 Imaging   CT neck 1. Status post laryngectomy and neck dissection without lymphadenopathy or mass lesion. 2. Short segment severe stenosis of the left internal carotid artery adjacent to a surgical clip.     06/08/2020 Imaging   CT chest 1. There is a large, hypodense lesion within, or very closely abutting and likely intruding upon the right ventricle, as seen on prior examination and approximately 4.6 x 4.3 cm, poorly evaluated due to cardiac motion. Findings are consistent with metastatic lesion and/or thrombus and poorly assessed by ungated CT. Consider echocardiography and cardiac MRI for further assessment.   2. Multiple small pulmonary nodules in the bilateral lung apices are unchanged. Attention on follow-up.   3.  No evidence of new metastatic disease in the chest.   4.  Aortic Atherosclerosis (ICD10-I70.0).     09/07/2020 Imaging   1. Mild decrease in size of juxta cardiac metastatic involving the right ventricle. 2. Small pulmonary nodules are unchanged from previous exam. 3. Aortic atherosclerosis. 4. Gallstone.     12/14/2020 Procedure   Successful exchange and up size from 20 French pull-through gastrostomy to 22 French balloon retention gastrostomy.     REVIEW OF SYSTEMS:   Constitutional: Denies fevers, chills or abnormal weight loss Eyes: Denies blurriness of vision Ears, nose, mouth, throat, and face: Denies mucositis or sore throat Respiratory: Denies cough, dyspnea or  wheezes Cardiovascular: Denies palpitation, chest discomfort or lower extremity swelling Gastrointestinal:  Denies nausea, heartburn or change in bowel habits Skin: Denies abnormal skin rashes Lymphatics: Denies new lymphadenopathy or easy bruising Neurological:Denies numbness, tingling or new weaknesses Behavioral/Psych: Mood is stable, no new changes  All other systems were reviewed with the patient and are negative.  I have reviewed the past medical history, past surgical history, social history and family history with the patient and they are unchanged from previous note.  ALLERGIES:  has No Known Allergies.  MEDICATIONS:  Current Outpatient Medications  Medication Sig Dispense Refill  . chlorhexidine (PERIDEX) 0.12 % solution Use as directed 15 mLs in the mouth or throat 2 (two) times daily. Rinse with 15 mls twice daily for 30 seconds. Use after breakfast and at bedtime. Spit out excess. Do not swallow. 473 mL 2  . Cholecalciferol (VITAMIN D3) 125 MCG (5000 UT) CAPS Take 5,000 Units by mouth daily.    Marland Kitchen HYDROcodone-acetaminophen (NORCO/VICODIN) 5-325 MG tablet TAKE 1 TABLET BY MOUTH EVERY 6 HOURS AS NEEDED FOR MODERATE PAIN 60 tablet 0  . levothyroxine (SYNTHROID) 137 MCG tablet Take 137 mcg by mouth daily before breakfast.    . lidocaine-prilocaine (EMLA) cream Apply to affected area once (Patient taking differently: Apply 1 application topically daily as needed (port access). Apply to affected area once) 30 g 3  . Nutritional Supplements (KATE FARMS PEPTIDE 1.5) LIQD Take 4 Bottles by mouth daily. 325 mL 11  . ondansetron (ZOFRAN) 8 MG tablet Take 1 tablet (8 mg total) by mouth 2 (two) times daily as needed (Nausea or vomiting). Bud  tablet 1  . prochlorperazine (COMPAZINE) 10 MG tablet Take 1 tablet (10 mg total) by mouth every 6 (six) hours as needed (Nausea or vomiting). 30 tablet 1  . sodium fluoride (PREVIDENT 5000 PLUS) 1.1 % CREA dental cream Apply cream to tooth brush. Brush  teeth for 2 minutes. Spit out excess. DO NOT rinse afterwards. Repeat nightly. (Patient taking differently: Place 1 application onto teeth 2 (two) times a day.) 1 Tube prn   No current facility-administered medications for this visit.   Facility-Administered Medications Ordered in Other Visits  Medication Dose Route Frequency Provider Last Rate Last Admin  . heparin lock flush 100 unit/mL  500 Units Intracatheter Once PRN Alvy Bimler, Macel Yearsley, MD      . pembrolizumab (KEYTRUDA) 200 mg in sodium chloride 0.9 % 50 mL chemo infusion  200 mg Intravenous Once Alvy Bimler, Zaydan Papesh, MD 116 mL/hr at 01/19/21 1052 200 mg at 01/19/21 1052  . sodium chloride flush (NS) 0.9 % injection 10 mL  10 mL Intracatheter PRN Alvy Bimler, Saagar Tortorella, MD        PHYSICAL EXAMINATION: ECOG PERFORMANCE STATUS: 0 - Asymptomatic  Vitals:   01/19/21 0951  BP: 108/73  Pulse: 88  Resp: 16  Temp: 97.6 F (36.4 C)  SpO2: 100%   Filed Weights   01/19/21 0951  Weight: 143 lb 12.8 oz (65.2 kg)    GENERAL:alert, no distress and comfortable SKIN: skin color, texture, turgor are normal, no rashes or significant lesions EYES: normal, Conjunctiva are pink and non-injected, sclera clear OROPHARYNX:no exudate, no erythema and lips, buccal mucosa, and tongue normal.  No oral thrush NECK: Tracheostomy in situ LYMPH:  no palpable lymphadenopathy in the cervical, axillary or inguinal LUNGS: clear to auscultation and percussion with normal breathing effort HEART: regular rate & rhythm and no murmurs and no lower extremity edema ABDOMEN:abdomen soft, non-tender and normal bowel sounds.  Feeding tube site looks okay Musculoskeletal:no cyanosis of digits and no clubbing  NEURO: alert & oriented, no focal motor/sensory deficits  LABORATORY DATA:  I have reviewed the data as listed    Component Value Date/Time   NA 138 01/19/2021 0943   K 4.1 01/19/2021 0943   CL 101 01/19/2021 0943   CO2 28 01/19/2021 0943   GLUCOSE 96 01/19/2021 0943   BUN 22  01/19/2021 0943   CREATININE 0.74 01/19/2021 0943   CREATININE 0.60 02/17/2020 1128   CALCIUM 9.2 01/19/2021 0943   PROT 7.4 01/19/2021 0943   ALBUMIN 4.0 01/19/2021 0943   AST 21 01/19/2021 0943   AST 22 02/17/2020 1128   ALT 17 01/19/2021 0943   ALT 24 02/17/2020 1128   ALKPHOS 75 01/19/2021 0943   BILITOT 0.3 01/19/2021 0943   BILITOT 0.5 02/17/2020 1128   GFRNONAA >60 01/19/2021 0943   GFRNONAA >60 02/17/2020 1128   GFRAA >60 06/08/2020 1303   GFRAA >60 02/17/2020 1128    No results found for: SPEP, UPEP  Lab Results  Component Value Date   WBC 4.7 01/19/2021   NEUTROABS 2.7 01/19/2021   HGB 13.3 01/19/2021   HCT 41.0 01/19/2021   MCV 89.3 01/19/2021   PLT 237 01/19/2021      Chemistry      Component Value Date/Time   NA 138 01/19/2021 0943   K 4.1 01/19/2021 0943   CL 101 01/19/2021 0943   CO2 28 01/19/2021 0943   BUN 22 01/19/2021 0943   CREATININE 0.74 01/19/2021 0943   CREATININE 0.60 02/17/2020 1128      Component  Value Date/Time   CALCIUM 9.2 01/19/2021 0943   ALKPHOS 75 01/19/2021 0943   AST 21 01/19/2021 0943   AST 22 02/17/2020 1128   ALT 17 01/19/2021 0943   ALT 24 02/17/2020 1128   BILITOT 0.3 01/19/2021 0943   BILITOT 0.5 02/17/2020 1128

## 2021-02-09 ENCOUNTER — Inpatient Hospital Stay: Payer: Medicare HMO | Admitting: Dietician

## 2021-02-09 ENCOUNTER — Other Ambulatory Visit: Payer: Self-pay

## 2021-02-09 ENCOUNTER — Inpatient Hospital Stay: Payer: Medicare HMO

## 2021-02-09 ENCOUNTER — Encounter: Payer: Self-pay | Admitting: Hematology and Oncology

## 2021-02-09 ENCOUNTER — Inpatient Hospital Stay: Payer: Medicare HMO | Attending: Hematology | Admitting: Hematology and Oncology

## 2021-02-09 ENCOUNTER — Other Ambulatory Visit: Payer: Self-pay | Admitting: Hematology and Oncology

## 2021-02-09 ENCOUNTER — Other Ambulatory Visit (HOSPITAL_COMMUNITY): Payer: Self-pay

## 2021-02-09 VITALS — BP 109/59 | HR 83 | Temp 97.1°F | Resp 18 | Ht 62.0 in | Wt 141.4 lb

## 2021-02-09 DIAGNOSIS — Z5112 Encounter for antineoplastic immunotherapy: Secondary | ICD-10-CM | POA: Insufficient documentation

## 2021-02-09 DIAGNOSIS — G893 Neoplasm related pain (acute) (chronic): Secondary | ICD-10-CM | POA: Insufficient documentation

## 2021-02-09 DIAGNOSIS — D72819 Decreased white blood cell count, unspecified: Secondary | ICD-10-CM | POA: Insufficient documentation

## 2021-02-09 DIAGNOSIS — C329 Malignant neoplasm of larynx, unspecified: Secondary | ICD-10-CM | POA: Diagnosis not present

## 2021-02-09 DIAGNOSIS — Z930 Tracheostomy status: Secondary | ICD-10-CM | POA: Diagnosis not present

## 2021-02-09 DIAGNOSIS — J040 Acute laryngitis: Secondary | ICD-10-CM | POA: Diagnosis not present

## 2021-02-09 DIAGNOSIS — D72818 Other decreased white blood cell count: Secondary | ICD-10-CM

## 2021-02-09 DIAGNOSIS — E039 Hypothyroidism, unspecified: Secondary | ICD-10-CM

## 2021-02-09 DIAGNOSIS — Z87891 Personal history of nicotine dependence: Secondary | ICD-10-CM | POA: Diagnosis not present

## 2021-02-09 DIAGNOSIS — Z79899 Other long term (current) drug therapy: Secondary | ICD-10-CM | POA: Insufficient documentation

## 2021-02-09 DIAGNOSIS — C321 Malignant neoplasm of supraglottis: Secondary | ICD-10-CM | POA: Insufficient documentation

## 2021-02-09 DIAGNOSIS — Z430 Encounter for attention to tracheostomy: Secondary | ICD-10-CM | POA: Diagnosis not present

## 2021-02-09 DIAGNOSIS — Z923 Personal history of irradiation: Secondary | ICD-10-CM | POA: Insufficient documentation

## 2021-02-09 DIAGNOSIS — E46 Unspecified protein-calorie malnutrition: Secondary | ICD-10-CM | POA: Diagnosis not present

## 2021-02-09 DIAGNOSIS — Z95828 Presence of other vascular implants and grafts: Secondary | ICD-10-CM

## 2021-02-09 LAB — CBC WITH DIFFERENTIAL/PLATELET
Abs Immature Granulocytes: 0.01 10*3/uL (ref 0.00–0.07)
Basophils Absolute: 0 10*3/uL (ref 0.0–0.1)
Basophils Relative: 1 %
Eosinophils Absolute: 0.1 10*3/uL (ref 0.0–0.5)
Eosinophils Relative: 4 %
HCT: 36.6 % (ref 36.0–46.0)
Hemoglobin: 12.5 g/dL (ref 12.0–15.0)
Immature Granulocytes: 1 %
Lymphocytes Relative: 26 %
Lymphs Abs: 0.5 10*3/uL — ABNORMAL LOW (ref 0.7–4.0)
MCH: 29.2 pg (ref 26.0–34.0)
MCHC: 34.2 g/dL (ref 30.0–36.0)
MCV: 85.5 fL (ref 80.0–100.0)
Monocytes Absolute: 0.3 10*3/uL (ref 0.1–1.0)
Monocytes Relative: 15 %
Neutro Abs: 1.1 10*3/uL — ABNORMAL LOW (ref 1.7–7.7)
Neutrophils Relative %: 53 %
Platelets: 138 10*3/uL — ABNORMAL LOW (ref 150–400)
RBC: 4.28 MIL/uL (ref 3.87–5.11)
RDW: 13.2 % (ref 11.5–15.5)
WBC: 2 10*3/uL — ABNORMAL LOW (ref 4.0–10.5)
nRBC: 0 % (ref 0.0–0.2)

## 2021-02-09 LAB — COMPREHENSIVE METABOLIC PANEL
ALT: 21 U/L (ref 0–44)
AST: 32 U/L (ref 15–41)
Albumin: 3.4 g/dL — ABNORMAL LOW (ref 3.5–5.0)
Alkaline Phosphatase: 64 U/L (ref 38–126)
Anion gap: 13 (ref 5–15)
BUN: 20 mg/dL (ref 8–23)
CO2: 24 mmol/L (ref 22–32)
Calcium: 8.5 mg/dL — ABNORMAL LOW (ref 8.9–10.3)
Chloride: 99 mmol/L (ref 98–111)
Creatinine, Ser: 0.77 mg/dL (ref 0.44–1.00)
GFR, Estimated: >60 mL/min (ref >60)
Glucose, Bld: 96 mg/dL (ref 70–99)
Potassium: 4 mmol/L (ref 3.5–5.1)
Sodium: 136 mmol/L (ref 135–145)
Total Bilirubin: 0.4 mg/dL (ref 0.3–1.2)
Total Protein: 7.1 g/dL (ref 6.5–8.1)

## 2021-02-09 LAB — TSH: TSH: 2.219 u[IU]/mL (ref 0.308–3.960)

## 2021-02-09 MED ORDER — AMOXICILLIN 500 MG PO CAPS
500.0000 mg | ORAL_CAPSULE | Freq: Two times a day (BID) | ORAL | 0 refills | Status: DC
  Filled 2021-02-09: qty 14, 7d supply, fill #0

## 2021-02-09 MED ORDER — HYDROCODONE-ACETAMINOPHEN 5-325 MG PO TABS
1.0000 | ORAL_TABLET | Freq: Four times a day (QID) | ORAL | 0 refills | Status: DC | PRN
  Filled 2021-02-09: qty 60, 15d supply, fill #0

## 2021-02-09 MED ORDER — SODIUM CHLORIDE 0.9 % IV SOLN
200.0000 mg | Freq: Once | INTRAVENOUS | Status: AC
  Administered 2021-02-09: 200 mg via INTRAVENOUS
  Filled 2021-02-09: qty 8

## 2021-02-09 MED ORDER — SODIUM CHLORIDE 0.9 % IV SOLN
Freq: Once | INTRAVENOUS | Status: AC
  Filled 2021-02-09: qty 250

## 2021-02-09 MED ORDER — HEPARIN SOD (PORK) LOCK FLUSH 100 UNIT/ML IV SOLN
500.0000 [IU] | Freq: Once | INTRAVENOUS | Status: AC | PRN
  Administered 2021-02-09: 500 [IU]
  Filled 2021-02-09: qty 5

## 2021-02-09 MED ORDER — SODIUM CHLORIDE 0.9% FLUSH
10.0000 mL | INTRAVENOUS | Status: DC | PRN
  Administered 2021-02-09: 10 mL
  Filled 2021-02-09: qty 10

## 2021-02-09 MED ORDER — SODIUM CHLORIDE 0.9% FLUSH
10.0000 mL | Freq: Once | INTRAVENOUS | Status: AC
  Administered 2021-02-09: 10 mL
  Filled 2021-02-09: qty 10

## 2021-02-09 NOTE — Progress Notes (Signed)
ANC 1.1 today, Ok to treat per Dr. Alvy Bimler

## 2021-02-09 NOTE — Progress Notes (Signed)
ANC 1.1 ok to treat per RN

## 2021-02-09 NOTE — Patient Instructions (Addendum)
[facility] ONCOLOGY  Discharge Instructions: Thank you for choosing [facility] to provide your oncology and hematology care.   If you have a lab appointment with the [facility], please go directly to the [facility] and check in at the registration area.   Wear comfortable clothing and clothing appropriate for easy access to any Portacath or PICC line.   We strive to give you quality time with your provider. You may need to reschedule your appointment if you arrive late (15 or more minutes).  Arriving late affects you and other patients whose appointments are after yours.  Also, if you miss three or more appointments without notifying the office, you may be dismissed from the clinic at the provider's discretion.      For prescription refill requests, have your pharmacy contact our office and allow 72 hours for refills to be completed.    Today you received the following chemotherapy and/or immunotherapy agents Keytruda      To help prevent nausea and vomiting after your treatment, we encourage you to take your nausea medication as directed.  BELOW ARE SYMPTOMS THAT SHOULD BE REPORTED IMMEDIATELY: . *FEVER GREATER THAN 100.4 F (38 C) OR HIGHER . *CHILLS OR SWEATING . *NAUSEA AND VOMITING THAT IS NOT CONTROLLED WITH YOUR NAUSEA MEDICATION . *UNUSUAL SHORTNESS OF BREATH . *UNUSUAL BRUISING OR BLEEDING . *URINARY PROBLEMS (pain or burning when urinating, or frequent urination) . *BOWEL PROBLEMS (unusual diarrhea, constipation, pain near the anus) . TENDERNESS IN MOUTH AND THROAT WITH OR WITHOUT PRESENCE OF ULCERS (sore throat, sores in mouth, or a toothache) . UNUSUAL RASH, SWELLING OR PAIN  . UNUSUAL VAGINAL DISCHARGE OR ITCHING   Items with * indicate a potential emergency and should be followed up as soon as possible or go to the Emergency Department if any problems should occur.  Please show the CHEMOTHERAPY ALERT CARD or IMMUNOTHERAPY ALERT  CARD at check-in to the Emergency Department and triage nurse.  Should you have questions after your visit or need to cancel or reschedule your appointment, please contact [facility]  Dept: [phone]  and follow the prompts.  Office hours are 8:00 a.m. to 4:30 p.m. Monday - Friday. Please note that voicemails left after 4:00 p.m. may not be returned until the following business day.  We are closed weekends and major holidays. You have access to a nurse at all times for urgent questions. Please call the main number to the clinic Dept: [phone] and follow the prompts.   For any non-urgent questions, you may also contact your provider using MyChart. We now offer e-Visits for anyone 34 and older to request care online for non-urgent symptoms. For details visit [email or web address].   Also download the MyChart app! Go to the app store, search "MyChart", open the app, select [facility], and log in with your MyChart username and password.  Due to Covid, a mask is required upon entering the hospital/clinic. If you do not have a mask, one will be given to you upon arrival. For doctor visits, patients may have 1 support person aged 55 or older with them. For treatment visits, patients cannot have anyone with them due to current Covid guidelines and our immunocompromised population.   Pembrolizumab injection What is this medicine? PEMBROLIZUMAB (pem broe liz ue mab) is a monoclonal antibody. It is used to treat certain types of cancer. This medicine may be used for other purposes; ask your health care provider or  pharmacist if you have questions. COMMON BRAND NAME(S): Keytruda What should I tell my health care provider before I take this medicine? They need to know if you have any of these conditions:  autoimmune diseases like Crohn's disease, ulcerative colitis, or lupus  have had or planning to have an allogeneic stem cell transplant (uses someone else's stem  cells)  history of organ transplant  history of chest radiation  nervous system problems like myasthenia gravis or Guillain-Barre syndrome  an unusual or allergic reaction to pembrolizumab, other medicines, foods, dyes, or preservatives  pregnant or trying to get pregnant  breast-feeding How should I use this medicine? This medicine is for infusion into a vein. It is given by a health care professional in a hospital or clinic setting. A special MedGuide will be given to you before each treatment. Be sure to read this information carefully each time. Talk to your pediatrician regarding the use of this medicine in children. While this drug may be prescribed for children as young as 6 months for selected conditions, precautions do apply. Overdosage: If you think you have taken too much of this medicine contact a poison control center or emergency room at once. NOTE: This medicine is only for you. Do not share this medicine with others. What if I miss a dose? It is important not to miss your dose. Call your doctor or health care professional if you are unable to keep an appointment. What may interact with this medicine? Interactions have not been studied. This list may not describe all possible interactions. Give your health care provider a list of all the medicines, herbs, non-prescription drugs, or dietary supplements you use. Also tell them if you smoke, drink alcohol, or use illegal drugs. Some items may interact with your medicine. What should I watch for while using this medicine? Your condition will be monitored carefully while you are receiving this medicine. You may need blood work done while you are taking this medicine. Do not become pregnant while taking this medicine or for 4 months after stopping it. Women should inform their doctor if they wish to become pregnant or think they might be pregnant. There is a potential for serious side effects to an unborn child. Talk to your  health care professional or pharmacist for more information. Do not breast-feed an infant while taking this medicine or for 4 months after the last dose. What side effects may I notice from receiving this medicine? Side effects that you should report to your doctor or health care professional as soon as possible:  allergic reactions like skin rash, itching or hives, swelling of the face, lips, or tongue  bloody or black, tarry  breathing problems  changes in vision  chest pain  chills  confusion  constipation  cough  diarrhea  dizziness or feeling faint or lightheaded  fast or irregular heartbeat  fever  flushing  joint pain  low blood counts - this medicine may decrease the number of white blood cells, red blood cells and platelets. You may be at increased risk for infections and bleeding.  muscle pain  muscle weakness  pain, tingling, numbness in the hands or feet  persistent headache  redness, blistering, peeling or loosening of the skin, including inside the mouth  signs and symptoms of high blood sugar such as dizziness; dry mouth; dry skin; fruity breath; nausea; stomach pain; increased hunger or thirst; increased urination  signs and symptoms of kidney injury like trouble passing urine or change in the  amount of urine  signs and symptoms of liver injury like dark urine, light-colored stools, loss of appetite, nausea, right upper belly pain, yellowing of the eyes or skin  sweating  swollen lymph nodes  weight loss Side effects that usually do not require medical attention (report to your doctor or health care professional if they continue or are bothersome):  decreased appetite  hair loss  tiredness This list may not describe all possible side effects. Call your doctor for medical advice about side effects. You may report side effects to FDA at 1-800-FDA-1088. Where should I keep my medicine? This drug is given in a hospital or clinic and will  not be stored at home. NOTE: This sheet is a summary. It may not cover all possible information. If you have questions about this medicine, talk to your doctor, pharmacist, or health care provider.  2021 Elsevier/Gold Standard (2019-08-11 21:44:53)

## 2021-02-09 NOTE — Assessment & Plan Note (Signed)
She will see dietitian today She finds it difficult to swallow her nutritional supplement through the mouth because it is thick I recommend her to add ice to see if she can swallow better She will see dietitian today

## 2021-02-09 NOTE — Progress Notes (Signed)
[place] OFFICE PROGRESS NOTE  Patient Care Team: Hamrick, Lorin Mercy, MD as PCP - General (Family Medicine) Heath Lark, MD as Consulting Physician (Hematology and Oncology)  ASSESSMENT & PLAN:  Laryngeal squamous cell carcinoma [facility]) She is doing very well without major side effects from treatment I recommend we delay her next CT until IV contrast is available We will continue immunotherapy indefinitely  Leukopenia The cause is unknown We will proceed with treatment I recommend a course of antibiotics due to discomfort around the trach site in case she has infection  Tracheostomy care [facility]) She has pain and mild bleeding around the trach site I recommend the son to call ENT service for evaluation  Protein malnutrition [facility]) She will see dietitian today She finds it difficult to swallow her nutritional supplement through the mouth because it is thick I recommend her to add ice to see if she can swallow better She will see dietitian today   No orders of the defined types were placed in this encounter.   All questions were answered. The patient knows to call the clinic with any problems, questions or concerns. The total time spent in the appointment was 20 minutes encounter with patients including review of chart and various tests results, discussions about plan of care and coordination of care plan   Heath Lark, MD 02/09/2021 9:23 AM  INTERVAL HISTORY: Please see below for problem oriented charting. She returns with her son today She has noted some mild bleeding around the trach site intermittently Denies fever or chills but she does not feel well She finds it difficult for her to swallow her nutritional intake and still using her feeding tube for now Her chronic pain is stable  SUMMARY OF ONCOLOGIC HISTORY: Oncology History  Laryngeal squamous cell carcinoma ([place])  12/02/2018 Imaging   CT neck: IMPRESSION: 1. Supraglottic laryngeal carcinoma extending  across the anterior commissure and invading the prelaryngeal fat, left aryepiglottic fold and left aspect of the epiglottis. 2. Severe narrowing of the laryngeal airway. 3. Bilateral subcentimeter level 2A and 2A cervical lymph nodes.     12/02/2018 Imaging   CT  Chest:  IMPRESSION: No evidence of metastatic disease in the chest.   12/03/2018 Pathology Results   (Care Everywhere; [facility]) A: Larynx, supraglottis, biopsy - Atypical squamous proliferation with at least low grade dysplasia and abundant keratinization - No definite high grade dysplasia or invasive carcinoma represented (see comment)   12/18/2018 Pathology Results   (Care Everywhere; [facility] A:  Neck, left, fine needle aspiration - Rare single atypical cells, no cells diagnostic of malignancy - Lymph node material present  B:  Neck, right, fine needle aspiration - Non-diagnostic specimen - No lymph node material identified - Mixed inflammation and blood   12/31/2018 Pathology Results   (Care Everywhere; [facility])  A: Larynx, left supraglottic mass, biopsy - Atypical squamous proliferation with abundant keratinization, suspicious for well-differentiated invasive squamous cell carcinoma  B: Larynx, left supraglottic mass, biopsy - Well-differentiated invasive squamous cell carcinoma with abundant keratinization (see comment)   01/18/2019 Initial Diagnosis   Laryngeal cancer ([place])   01/26/2019 Imaging   1. Locally advanced hypermetabolic laryngeal neoplasm centered in the left supraglottic space with bilateral glottic and epiglottic involvement and subglottic extension on the left. 2. Hypermetabolic bilateral level 3 neck nodal metastases. 3. Hypermetabolic high mediastinal nodal metastases between the trachea and upper thoracic esophagus. 4. Otherwise no distant hypermetabolic metastatic disease. 5. Nodular 7 mm focus at the left  lung base, below PET resolution, recommend attention on follow-up chest CT in  3 months. 6. Chronic findings include: Aortic Atherosclerosis (ICD10-I70.0). Cholelithiasis.   02/08/2019 Cancer Staging   Staging form: Larynx - Supraglottis, AJCC 8th Edition - Clinical stage from 02/08/2019: Stage IVA (cT3, cN2c, cM0) - Signed by Eppie Gibson, MD on 02/08/2019   02/11/2019 - 04/07/2019 Chemotherapy   The patient weekly cisplatin for chemotherapy treatment.     07/20/2019 Pathology Results   PROCEDURE: Total glossectomy, total pharyngectomy, total laryngectomy, total thyroidectomy, and cervical esophagectomy TUMOR SITE: Larynx TUMOR LATERALITY: Not specified TUMOR FOCALITY: Unifocal TUMOR SIZE:    GREATEST DIMENSION: 6.9 cm    ADDITIONAL DIMENSIONS: 5.4 x 2.4 cm HISTOLOGIC TYPE: Squamous cell carcinoma, conventional (keratinizing) HISTOLOGIC GRADE:  G1-G2: Well to moderately differentiated TUMOR EXTENSION: Tumor involves the posterior tongue, pharynx, larynx, tracheal cartilage, hyoid bone, cervical esophagus, vagus nerve, carotid artery, deep cervical fascia, ans surrounding skeletal muscle and soft tissue MARGINS: Uninvolved by invasive tumor; Perineural invasion is less than 59mm from the left soft tissue margin LYMPHOVASCULAR INVASION: Not identified PERINEURAL INVASION:  Present REGIONAL LYMPH NODES:     NUMBER OF LYMPH NODES INVOLVED: 2     NUMBER OF LYMPH NODES EXAMINED: 24     LATERALITY OF LYMPH NODES INVOLVED: Cannot determine     SIZE OF LARGEST METASTATIC DEPOSIT: 0.8 cm     EXTRANODAL EXTENSION: Not identified PATHOLOGIC STAGE CLASSIFICATION (pTNM, AJCC 8TH Ed): (r)(y) pT4b pN2a DISTANT METASTASIS (pM): ADDITIONAL PATHOLOGIC FINDINGS: Previous chemoradiation   11/04/2019 Imaging   PET: 1.  Hypermetabolic bilateral neck lesions as detailed above are concerning for disease recurrence. 2.  Compared to 07/08/2019 PET scan, interval development of subcentimeter bilateral upper lobe pulmonary nodules, size below PET resolution. These are concerning for  metastasis. Consider diagnostic quality CT chest exam for further evaluation as per oncology treatment planning. 3.  Focal hypermetabolic uptake in right ventricular cavity of indeterminate significance. Recommend echocardiographic examination to rule out underlying thrombus. This finding was discussed with Dr. Conley Canal by Dr. Clementeen Graham and Dr. Marcello Moores on 11/04/2019 at approximately 3:00 PM via telephone. 4.  Ancillary CT findings as above.   11/05/2019 Imaging   Echocardiogram: Summary   1. The left ventricle is normal in size with normal wall thickness.   2. The left ventricular systolic function is normal, LVEF is visually estimated at 60-65%.   3. The right ventricle is normal in size, with normal systolic function.   4. There is an extrinsic compression of the RV and RA laterally at the AV groove by a mass of 4.3 cm diameter. Unclear if the mass is invasive, although no pericardial effusion seen suggestive no invasion possibly. There is no evidence of hemodynamic compromise secondary to cardiac compression with normal IVC diameter.   12/06/2019 Pathology Results   A. LYMPH NODE, LEFT SUBMANDIBULAR, BIOPSY:  - Squamous cell carcinoma.  - No distinct nodal tissue identified.    12/06/2019 Procedure   Successful ultrasound-guided core biopsy of centrally necrotic left submandibular lymph node. Of note, due to extensive prior postsurgical and post radiation changes, patient has very limited mobility in the neck and the biopsy was extremely challenging    12/16/2019 -  Chemotherapy   The patient had pembrolizumab for chemotherapy treatment.     03/08/2020 PET scan   1. Diminished size of masses in the neck as described. Persistent increased metabolic activity particularly in the LEFT neck. Some of this diminished size on the LEFT could be due to  decompression of necrotic material. Correlate with any symptoms of inflammation or infection in this area as well given the paucity of residual soft  tissue associated with the above abnormality, overall findings are compatible with residual disease in this location and there is no significant surrounding stranding to indicate inflammation that would correlate with the degree of FDG uptake remaining. 2. Diminished activity in the juxta cardiac mass with peripheral activity suggesting interval necrosis, on the prior study this was uniformly hypermetabolic. There is still considerable FDG uptake. 3. No signs of disease in the abdomen or pelvis. 4. 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 Correlate with any new pain or symptoms in this area with further imaging as warranted. No discrete mass or bone destruction on today's study.   06/08/2020 Imaging   CT neck 1. Status post laryngectomy and neck dissection without lymphadenopathy or mass lesion. 2. Short segment severe stenosis of the left internal carotid artery adjacent to a surgical clip.     06/08/2020 Imaging   CT chest 1. There is a large, hypodense lesion within, or very closely abutting and likely intruding upon the right ventricle, as seen on prior examination and approximately 4.6 x 4.3 cm, poorly evaluated due to cardiac motion. Findings are consistent with metastatic lesion and/or thrombus and poorly assessed by ungated CT. Consider echocardiography and cardiac MRI for further assessment.   2. Multiple small pulmonary nodules in the bilateral lung apices are unchanged. Attention on follow-up.   3.  No evidence of new metastatic disease in the chest.   4.  Aortic Atherosclerosis (ICD10-I70.0).     09/07/2020 Imaging   1. Mild decrease in size of juxta cardiac metastatic involving the right ventricle. 2. Small pulmonary nodules are unchanged from previous exam. 3. Aortic atherosclerosis. 4. Gallstone.     12/14/2020 Procedure   Successful exchange and up size from 20 French pull-through gastrostomy to 22 French balloon retention gastrostomy.     REVIEW OF SYSTEMS:    Constitutional: Denies fevers, chills or abnormal weight loss Eyes: Denies blurriness of vision Ears, nose, mouth, throat, and face: Denies mucositis or sore throat Respiratory: Denies cough, dyspnea or wheezes Cardiovascular: Denies palpitation, chest discomfort or lower extremity swelling Gastrointestinal:  Denies nausea, heartburn or change in bowel habits Skin: Denies abnormal skin rashes Lymphatics: Denies new lymphadenopathy or easy bruising Neurological:Denies numbness, tingling or new weaknesses Behavioral/Psych: Mood is stable, no new changes  All other systems were reviewed with the patient and are negative.  I have reviewed the past medical history, past surgical history, social history and family history with the patient and they are unchanged from previous note.  ALLERGIES:  has No Known Allergies.  MEDICATIONS:  Current Outpatient Medications  Medication Sig Dispense Refill  . amoxicillin (AMOXIL) 500 MG capsule Take 1 capsule (500 mg total) by mouth 2 (two) times daily. 14 capsule 0  . chlorhexidine (PERIDEX) 0.12 % solution Use as directed 15 mLs in the mouth or throat 2 (two) times daily. Rinse with 15 mls twice daily for 30 seconds. Use after breakfast and at bedtime. Spit out excess. Do not swallow. 473 mL 2  . Cholecalciferol (VITAMIN D3) 125 MCG (5000 UT) CAPS Take 5,000 Units by mouth daily.    Marland Kitchen HYDROcodone-acetaminophen (NORCO/VICODIN) 5-325 MG tablet TAKE 1 TABLET BY MOUTH EVERY 6 HOURS AS NEEDED FOR MODERATE PAIN 60 tablet 0  . levothyroxine (SYNTHROID) 137 MCG tablet Take 137 mcg by mouth daily before breakfast.    . lidocaine-prilocaine (EMLA) cream Apply  to affected area once (Patient taking differently: Apply 1 application topically daily as needed (port access). Apply to affected area once) 30 g 3  . Nutritional Supplements (K[employer]AR[employer] PEPTIDE 1.5) LIQD Take 4 Bottles by mouth daily. 325 mL 11  . ondansetron (ZOFRAN) 8 MG tablet Take 1 tablet (8 mg total) by  mouth 2 (two) times daily as needed (Nausea or vomiting). 30 tablet 1  . prochlorperazine (COMPAZINE) 10 MG tablet Take 1 tablet (10 mg total) by mouth every 6 (six) hours as needed (Nausea or vomiting). 30 tablet 1  . sodium fluoride (PREVIDENT 5000 PLUS) 1.1 % CREA dental cream Apply cream to tooth brush. Brush teeth for 2 minutes. Spit out excess. DO NOT rinse afterwards. Repeat nightly. (Patient taking differently: Place 1 application onto teeth 2 (two) times a day.) 1 Tube prn   No current facility-administered medications for this visit.   Facility-Administered Medications Ordered in Other Visits  Medication Dose Route Frequency Provider Last Rate Last Admin  . heparin lock flush 100 unit/mL  500 Units Intracatheter Once PRN Alvy Bimler, Emilya Justen, MD      . pembrolizumab (KEYTRUDA) 200 mg in sodium chloride 0.9 % 50 mL chemo infusion  200 mg Intravenous Once Shauntel Prest, MD      . sodium chloride flush (NS) 0.9 % injection 10 mL  10 mL Intracatheter PRN Alvy Bimler, Jayleon Mcfarlane, MD        PHYSICAL EXAMINATION: ECOG PERFORMANCE STATUS: 1 - Symptomatic but completely ambulatory  Vitals:   02/09/21 0802  BP: (!) 109/59  Pulse: 83  Resp: 18  Temp: (!) 97.1 F (36.2 C)  SpO2: 98%   Filed Weights   02/09/21 0802  Weight: 141 lb 6.4 oz (64.1 kg)    GENERAL:alert, no distress and comfortable SKIN: skin color, texture, turgor are normal, no rashes or significant lesions EYES: normal, Conjunctiva are pink and non-injected, sclera clear OROPHARYNX:no exudate, no erythema and lips, buccal mucosa, and tongue normal  NECK: Tracheostomy looks okay  LYMPH:  no palpable lymphadenopathy in the cervical, axillary or inguinal LUNGS: clear to auscultation and percussion with normal breathing effort HEART: regular rate & rhythm and no murmurs and no lower extremity edema ABDOMEN:abdomen soft, non-tender and normal bowel sounds Musculoskeletal:no cyanosis of digits and no clubbing  NEURO: alert & oriented, no focal  motor/sensory deficits  LABORATORY DATA:  I have reviewed the data as listed    Component Value Date/Time   NA 136 02/09/2021 0748   K 4.0 02/09/2021 0748   CL 99 02/09/2021 0748   CO2 24 02/09/2021 0748   GLUCOSE 96 02/09/2021 0748   BUN 20 02/09/2021 0748   CREATININE 0.77 02/09/2021 0748   CREATININE 0.60 02/17/2020 1128   CALCIUM 8.5 (L) 02/09/2021 0748   PROT 7.1 02/09/2021 0748   ALBUMIN 3.4 (L) 02/09/2021 0748   AST 32 02/09/2021 0748   AST 22 02/17/2020 1128   ALT 21 02/09/2021 0748   ALT 24 02/17/2020 1128   ALKPHOS 64 02/09/2021 0748   BILITOT 0.4 02/09/2021 0748   BILITOT 0.5 02/17/2020 1128   GFRNONAA >60 02/09/2021 0748   GFRNONAA >60 02/17/2020 1128   GFRAA >60 06/08/2020 1303   GFRAA >60 02/17/2020 1128    No results found for: SPEP, UPEP  Lab Results  Component Value Date   WBC 2.0 (L) 02/09/2021   NEUTROABS 1.1 (L) 02/09/2021   HGB 12.5 02/09/2021   HCT 36.6 02/09/2021   MCV 85.5 02/09/2021   PLT 138 (L)  02/09/2021      Chemistry      Component Value Date/Time   NA 136 02/09/2021 0748   K 4.0 02/09/2021 0748   CL 99 02/09/2021 0748   CO2 24 02/09/2021 0748   BUN 20 02/09/2021 0748   CREATININE 0.77 02/09/2021 0748   CREATININE 0.60 02/17/2020 1128      Component Value Date/Time   CALCIUM 8.5 (L) 02/09/2021 0748   ALKPHOS 64 02/09/2021 0748   AST 32 02/09/2021 0748   AST 22 02/17/2020 1128   ALT 21 02/09/2021 0748   ALT 24 02/17/2020 1128   BILITOT 0.4 02/09/2021 0748   BILITOT 0.5 02/17/2020 1128

## 2021-02-09 NOTE — Progress Notes (Signed)
Nutrition Follow-up:  Patient with recurrent laryngeal cancer receiving Keytruda.  Met with patient in infusion. She reports doing well and continues to drink 4-5 [employer] 1.5, reports it is thick. Patient reports 2 cases of [employer] [place] delivered yesterday. Patient continues to drink 2-3 Boost Plus as well as 5 (8oz) bottles of water by mouth. Patient continues to give medications via tube, she flushes tube with 8 oz bottle of water. Patient reports she has not tried other foods, she does like vanilla ice cream. She denies nausea, vomiting, diarrhea, constipation.     Medications: reviewed  Labs: reviewed  Anthropometrics: Weight 141 lb 6.4 oz stable   4/29 - 143 lb 12.8 oz 4/8 - 138 lb 8 oz     NUTRITION DIAGNOSIS: Food and nutrition related knowledge deficit improving  INTERVENTION:  Continue drinking [employer] 1.5 - 4 cartons and 2 cartons boost plus by mouth Discussed cutting [employer] 1.5 with  milk or could pour over ice to thin out supplement Continue drinking 5 (8oz) bottles of water by mouth Encouraged continuing to try other full liquids  May need speech referral to review swallowing exercises     MONITORING, EVALUATION, GOAL: weight trends, oral intake   NEXT VISIT: to be scheduled

## 2021-02-09 NOTE — Assessment & Plan Note (Signed)
She is doing very well without major side effects from treatment I recommend we delay her next CT until IV contrast is available We will continue immunotherapy indefinitely

## 2021-02-09 NOTE — Assessment & Plan Note (Signed)
The cause is unknown We will proceed with treatment I recommend a course of antibiotics due to discomfort around the trach site in case she has infection

## 2021-02-09 NOTE — Assessment & Plan Note (Signed)
She has pain and mild bleeding around the trach site I recommend the son to call ENT service for evaluation

## 2021-02-12 ENCOUNTER — Emergency Department (HOSPITAL_COMMUNITY): Payer: Medicare HMO

## 2021-02-12 ENCOUNTER — Inpatient Hospital Stay (HOSPITAL_COMMUNITY)
Admission: EM | Admit: 2021-02-12 | Discharge: 2021-02-15 | DRG: 204 | Disposition: A | Discharge status: Home / Self Care | Payer: Medicare HMO | Attending: Family Medicine | Admitting: Family Medicine

## 2021-02-12 ENCOUNTER — Other Ambulatory Visit: Payer: Self-pay

## 2021-02-12 ENCOUNTER — Encounter (HOSPITAL_COMMUNITY): Payer: Self-pay | Admitting: Emergency Medicine

## 2021-02-12 DIAGNOSIS — D701 Agranulocytosis secondary to cancer chemotherapy: Secondary | ICD-10-CM | POA: Diagnosis present

## 2021-02-12 DIAGNOSIS — Z87891 Personal history of nicotine dependence: Secondary | ICD-10-CM

## 2021-02-12 DIAGNOSIS — I959 Hypotension, unspecified: Secondary | ICD-10-CM | POA: Diagnosis present

## 2021-02-12 DIAGNOSIS — Z79899 Other long term (current) drug therapy: Secondary | ICD-10-CM

## 2021-02-12 DIAGNOSIS — Z9842 Cataract extraction status, left eye: Secondary | ICD-10-CM

## 2021-02-12 DIAGNOSIS — C3490 Malignant neoplasm of unspecified part of unspecified bronchus or lung: Secondary | ICD-10-CM

## 2021-02-12 DIAGNOSIS — U071 COVID-19: Secondary | ICD-10-CM | POA: Diagnosis present

## 2021-02-12 DIAGNOSIS — R0600 Dyspnea, unspecified: Secondary | ICD-10-CM

## 2021-02-12 DIAGNOSIS — R042 Hemoptysis: Secondary | ICD-10-CM | POA: Diagnosis not present

## 2021-02-12 DIAGNOSIS — Y929 Unspecified place or not applicable: Secondary | ICD-10-CM

## 2021-02-12 DIAGNOSIS — R059 Cough, unspecified: Secondary | ICD-10-CM | POA: Diagnosis not present

## 2021-02-12 DIAGNOSIS — E039 Hypothyroidism, unspecified: Secondary | ICD-10-CM | POA: Diagnosis present

## 2021-02-12 DIAGNOSIS — G893 Neoplasm related pain (acute) (chronic): Secondary | ICD-10-CM | POA: Diagnosis present

## 2021-02-12 DIAGNOSIS — Z9002 Acquired absence of larynx: Secondary | ICD-10-CM

## 2021-02-12 DIAGNOSIS — Z6825 Body mass index (BMI) 25.0-25.9, adult: Secondary | ICD-10-CM

## 2021-02-12 DIAGNOSIS — R1310 Dysphagia, unspecified: Secondary | ICD-10-CM | POA: Diagnosis present

## 2021-02-12 DIAGNOSIS — E890 Postprocedural hypothyroidism: Secondary | ICD-10-CM | POA: Diagnosis present

## 2021-02-12 DIAGNOSIS — Z923 Personal history of irradiation: Secondary | ICD-10-CM

## 2021-02-12 DIAGNOSIS — T451X5A Adverse effect of antineoplastic and immunosuppressive drugs, initial encounter: Secondary | ICD-10-CM | POA: Diagnosis present

## 2021-02-12 DIAGNOSIS — Z743 Need for continuous supervision: Secondary | ICD-10-CM | POA: Diagnosis not present

## 2021-02-12 DIAGNOSIS — Z931 Gastrostomy status: Secondary | ICD-10-CM

## 2021-02-12 DIAGNOSIS — Z95828 Presence of other vascular implants and grafts: Secondary | ICD-10-CM

## 2021-02-12 DIAGNOSIS — H547 Unspecified visual loss: Secondary | ICD-10-CM | POA: Diagnosis not present

## 2021-02-12 DIAGNOSIS — Z8249 Family history of ischemic heart disease and other diseases of the circulatory system: Secondary | ICD-10-CM

## 2021-02-12 DIAGNOSIS — I10 Essential (primary) hypertension: Secondary | ICD-10-CM | POA: Diagnosis not present

## 2021-02-12 DIAGNOSIS — E46 Unspecified protein-calorie malnutrition: Secondary | ICD-10-CM | POA: Diagnosis present

## 2021-02-12 DIAGNOSIS — C329 Malignant neoplasm of larynx, unspecified: Secondary | ICD-10-CM | POA: Diagnosis present

## 2021-02-12 DIAGNOSIS — L8990 Pressure ulcer of unspecified site, unspecified stage: Secondary | ICD-10-CM | POA: Insufficient documentation

## 2021-02-12 DIAGNOSIS — R0602 Shortness of breath: Secondary | ICD-10-CM | POA: Diagnosis not present

## 2021-02-12 DIAGNOSIS — Z9841 Cataract extraction status, right eye: Secondary | ICD-10-CM

## 2021-02-12 LAB — CBC WITH DIFFERENTIAL/PLATELET
Abs Immature Granulocytes: 0.01 10*3/uL (ref 0.00–0.07)
Basophils Absolute: 0 10*3/uL (ref 0.0–0.1)
Basophils Relative: 1 %
Eosinophils Absolute: 0.1 10*3/uL (ref 0.0–0.5)
Eosinophils Relative: 4 %
HCT: 36.6 % (ref 36.0–46.0)
Hemoglobin: 12.3 g/dL (ref 12.0–15.0)
Immature Granulocytes: 1 %
Lymphocytes Relative: 33 %
Lymphs Abs: 0.7 10*3/uL (ref 0.7–4.0)
MCH: 29.2 pg (ref 26.0–34.0)
MCHC: 33.6 g/dL (ref 30.0–36.0)
MCV: 86.9 fL (ref 80.0–100.0)
Monocytes Absolute: 0.4 10*3/uL (ref 0.1–1.0)
Monocytes Relative: 19 %
Neutro Abs: 0.9 10*3/uL — ABNORMAL LOW (ref 1.7–7.7)
Neutrophils Relative %: 42 %
Platelets: 188 10*3/uL (ref 150–400)
RBC: 4.21 MIL/uL (ref 3.87–5.11)
RDW: 13.2 % (ref 11.5–15.5)
WBC: 2 10*3/uL — ABNORMAL LOW (ref 4.0–10.5)
nRBC: 0 % (ref 0.0–0.2)

## 2021-02-12 LAB — COMPREHENSIVE METABOLIC PANEL
ALT: 20 U/L (ref 0–44)
AST: 26 U/L (ref 15–41)
Albumin: 3.3 g/dL — ABNORMAL LOW (ref 3.5–5.0)
Alkaline Phosphatase: 55 U/L (ref 38–126)
Anion gap: 9 (ref 5–15)
BUN: 19 mg/dL (ref 8–23)
CO2: 23 mmol/L (ref 22–32)
Calcium: 8 mg/dL — ABNORMAL LOW (ref 8.9–10.3)
Chloride: 102 mmol/L (ref 98–111)
Creatinine, Ser: 0.65 mg/dL (ref 0.44–1.00)
GFR, Estimated: >60 mL/min (ref >60)
Glucose, Bld: 93 mg/dL (ref 70–99)
Potassium: 3.8 mmol/L (ref 3.5–5.1)
Sodium: 134 mmol/L — ABNORMAL LOW (ref 135–145)
Total Bilirubin: 0.3 mg/dL (ref 0.3–1.2)
Total Protein: 6.7 g/dL (ref 6.5–8.1)

## 2021-02-12 LAB — PROTIME-INR
INR: 1 (ref 0.8–1.2)
Prothrombin Time: 12.7 seconds (ref 11.4–15.2)

## 2021-02-12 LAB — BASIC METABOLIC PANEL
Anion gap: 8 (ref 5–15)
BUN: 21 mg/dL (ref 8–23)
CO2: 24 mmol/L (ref 22–32)
Calcium: 8.1 mg/dL — ABNORMAL LOW (ref 8.9–10.3)
Chloride: 104 mmol/L (ref 98–111)
Creatinine, Ser: 0.74 mg/dL (ref 0.44–1.00)
GFR, Estimated: >60 mL/min (ref >60)
Glucose, Bld: 102 mg/dL — ABNORMAL HIGH (ref 70–99)
Potassium: 3.9 mmol/L (ref 3.5–5.1)
Sodium: 136 mmol/L (ref 135–145)

## 2021-02-12 LAB — RESP PANEL BY RT-PCR (FLU A&B, COVID) ARPGX2
Influenza A by PCR: NEGATIVE
Influenza B by PCR: NEGATIVE
SARS Coronavirus 2 by RT PCR: POSITIVE — AB

## 2021-02-12 LAB — LACTIC ACID, PLASMA: Lactic Acid, Venous: 0.6 mmol/L (ref 0.5–1.9)

## 2021-02-12 LAB — MRSA PCR SCREENING: MRSA by PCR: NEGATIVE

## 2021-02-12 LAB — APTT: aPTT: 29 seconds (ref 24–36)

## 2021-02-12 LAB — C-REACTIVE PROTEIN: CRP: 0.8 mg/dL (ref <1.0)

## 2021-02-12 LAB — PROCALCITONIN: Procalcitonin: <0.1 ng/mL

## 2021-02-12 LAB — URINALYSIS, ROUTINE W REFLEX MICROSCOPIC
Bilirubin Urine: NEGATIVE
Glucose, UA: NEGATIVE mg/dL
Hgb urine dipstick: NEGATIVE
Ketones, ur: 5 mg/dL — AB
Leukocytes,Ua: NEGATIVE
Nitrite: NEGATIVE
Protein, ur: NEGATIVE mg/dL
Specific Gravity, Urine: 1.006 (ref 1.005–1.030)
pH: 8 (ref 5.0–8.0)

## 2021-02-12 IMAGING — DX DG CHEST 1V PORT
1 series · 1 of 1 positions shown · non-contrast
Comparison: [DATE] CT

CLINICAL DATA: Hemoptysis

EXAM:
PORTABLE CHEST 1 VIEW

[chest ap]
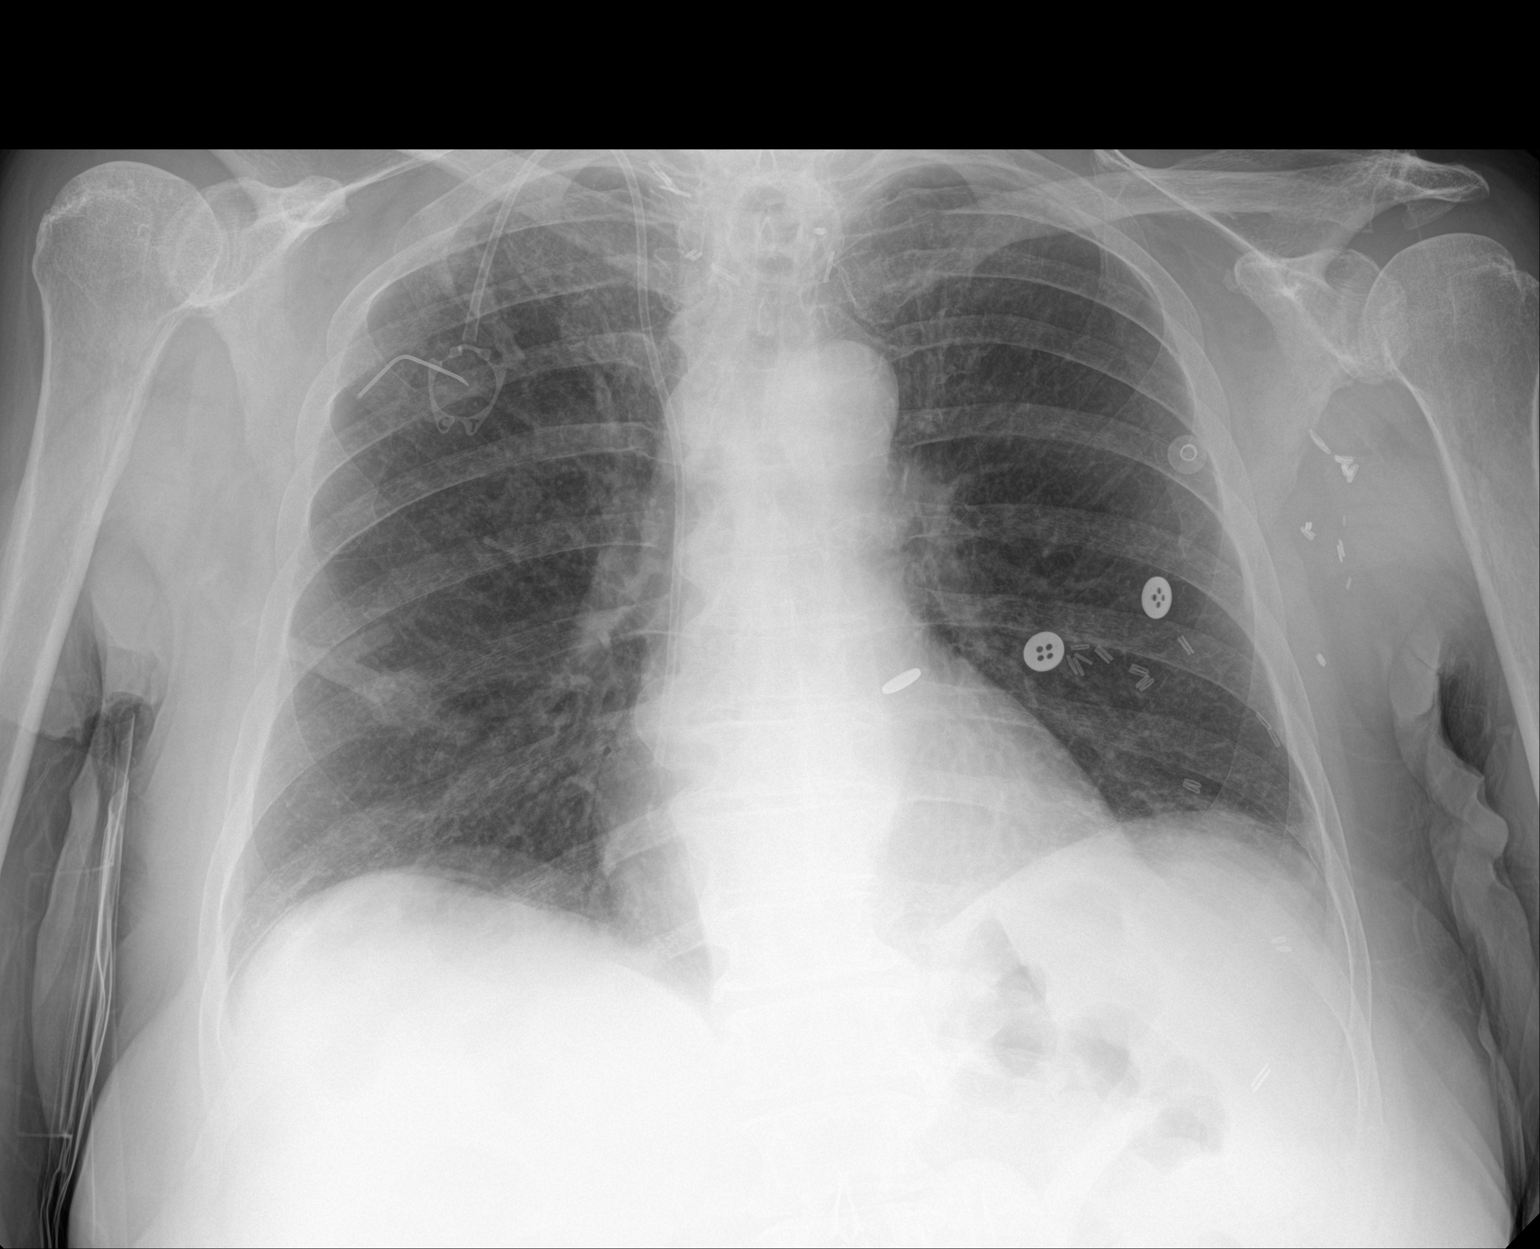

[1 of 1 positions shown; findings below may reference images not displayed]

FINDINGS: Cardiac shadow is within normal limits. Right-sided chest wall port
is noted in satisfactory position. Postsurgical changes are noted in
the neck consistent with the given clinical history of laryngeal
carcinoma. Postsurgical changes in the left axilla are noted as
well. Lungs are clear bilaterally. No focal infiltrate or effusion
is noted. No bony abnormality is seen. Tracheostomy tube is noted in
place.
IMPRESSION: No acute abnormality noted.

## 2021-02-12 MED ORDER — LEVOTHYROXINE SODIUM 25 MCG PO TABS
137.0000 ug | ORAL_TABLET | Freq: Every day | ORAL | Status: DC
  Administered 2021-02-13 – 2021-02-15 (×3): 137 ug via ORAL
  Filled 2021-02-12 (×3): qty 1

## 2021-02-12 MED ORDER — VANCOMYCIN HCL IN DEXTROSE 1-5 GM/200ML-% IV SOLN: 1000.0000 mg | Freq: Once | INTRAVENOUS | Status: DC

## 2021-02-12 MED ORDER — SODIUM CHLORIDE 0.9 % IV SOLN
200.0000 mg | Freq: Once | INTRAVENOUS | Status: AC
  Administered 2021-02-12: 200 mg via INTRAVENOUS
  Filled 2021-02-12: qty 40

## 2021-02-12 MED ORDER — LACTATED RINGERS IV BOLUS (SEPSIS)
1000.0000 mL | Freq: Once | INTRAVENOUS | Status: AC
  Administered 2021-02-12: 1000 mL via INTRAVENOUS

## 2021-02-12 MED ORDER — ZINC SULFATE 220 (50 ZN) MG PO CAPS
220.0000 mg | ORAL_CAPSULE | Freq: Every day | ORAL | Status: DC
  Administered 2021-02-12 – 2021-02-15 (×4): 220 mg via ORAL
  Filled 2021-02-12 (×4): qty 1

## 2021-02-12 MED ORDER — LACTATED RINGERS IV SOLN: INTRAVENOUS | Status: AC

## 2021-02-12 MED ORDER — HYDROCODONE-ACETAMINOPHEN 5-325 MG PO TABS
1.0000 | ORAL_TABLET | Freq: Four times a day (QID) | ORAL | Status: DC | PRN
  Administered 2021-02-12 – 2021-02-15 (×5): 1 via ORAL
  Filled 2021-02-12 (×5): qty 1

## 2021-02-12 MED ORDER — ONDANSETRON HCL 4 MG PO TABS: 8.0000 mg | ORAL_TABLET | Freq: Two times a day (BID) | ORAL | Status: DC | PRN

## 2021-02-12 MED ORDER — SODIUM CHLORIDE 0.9 % IV SOLN
2.0000 g | Freq: Three times a day (TID) | INTRAVENOUS | Status: DC
  Administered 2021-02-12: 2 g via INTRAVENOUS
  Filled 2021-02-12: qty 2

## 2021-02-12 MED ORDER — PROCHLORPERAZINE MALEATE 10 MG PO TABS: 10.0000 mg | ORAL_TABLET | Freq: Four times a day (QID) | ORAL | Status: DC | PRN

## 2021-02-12 MED ORDER — ACETAMINOPHEN 325 MG PO TABS
650.0000 mg | ORAL_TABLET | Freq: Four times a day (QID) | ORAL | Status: DC | PRN
  Administered 2021-02-14: 650 mg via ORAL
  Filled 2021-02-12: qty 2

## 2021-02-12 MED ORDER — SODIUM CHLORIDE 0.9 % IV SOLN
100.0000 mg | Freq: Every day | INTRAVENOUS | Status: DC
  Administered 2021-02-13 – 2021-02-15 (×3): 100 mg via INTRAVENOUS
  Filled 2021-02-12 (×3): qty 20

## 2021-02-12 MED ORDER — GUAIFENESIN-DM 100-10 MG/5ML PO SYRP
10.0000 mL | ORAL_SOLUTION | ORAL | Status: DC | PRN
  Administered 2021-02-14: 10 mL
  Filled 2021-02-12: qty 10

## 2021-02-12 MED ORDER — SODIUM CHLORIDE 0.9 % IV SOLN
500.0000 mg | Freq: Once | INTRAVENOUS | Status: AC
  Administered 2021-02-12: 500 mg via INTRAVENOUS
  Filled 2021-02-12: qty 500

## 2021-02-12 MED ORDER — TBO-FILGRASTIM 480 MCG/0.8ML ~~LOC~~ SOSY
480.0000 ug | PREFILLED_SYRINGE | Freq: Once | SUBCUTANEOUS | Status: AC
  Administered 2021-02-12: 480 ug via SUBCUTANEOUS
  Filled 2021-02-12: qty 0.8

## 2021-02-12 MED ORDER — VANCOMYCIN HCL 1250 MG/250ML IV SOLN
1250.0000 mg | Freq: Once | INTRAVENOUS | Status: AC
  Administered 2021-02-12: 1250 mg via INTRAVENOUS
  Filled 2021-02-12: qty 250

## 2021-02-12 MED ORDER — ASCORBIC ACID 500 MG PO TABS
500.0000 mg | ORAL_TABLET | Freq: Every day | ORAL | Status: DC
  Administered 2021-02-12 – 2021-02-15 (×4): 500 mg via ORAL
  Filled 2021-02-12 (×4): qty 1

## 2021-02-12 MED ORDER — SODIUM CHLORIDE 0.9 % IV SOLN
2.0000 g | Freq: Once | INTRAVENOUS | Status: AC
  Administered 2021-02-12: 2 g via INTRAVENOUS
  Filled 2021-02-12: qty 2

## 2021-02-12 MED ORDER — FENTANYL CITRATE (PF) 100 MCG/2ML IJ SOLN
25.0000 ug | Freq: Once | INTRAMUSCULAR | Status: AC
  Administered 2021-02-12: 25 ug via INTRAVENOUS
  Filled 2021-02-12: qty 2

## 2021-02-12 NOTE — H&P (Signed)
History and Physical    [name] [name]:[id number] DOB: September 29, 1950 DOA: 02/12/2021  PCP: [name] Sake, MD   Patient coming from: Home    Chief Complaint: Hemoptysis  HPI: [name] [name] is a 70 y.o. female with medical history significant of extensive laryngeal cancer(squamous cell carcinoma) diagnosed in 2020 status post radical neck surgery with laryngectomy, currently on immunotherapy with Keytruda every 3 weeks and follows with oncology presents to the emergency department with complaints of 3-day history of hemoptysis.  Patient reported passage of blood clots in her sputum and it happened 3-4 times a day.  She was also having cough.  She was seen by her oncologist, Dr. Alvy [name], as an outpatient, suspected to have respiratory infection that could have contributed to the hemoptysis and was started on amoxicillin on 5/20 but it did not help.  She is a status post trach and PEG. Patient seen and examined the bedside in the emergency department.  Initially she was hypotensive on presentation but blood pressures gradually improved with IV fluids.  Patient's son was at the bedside.  During my evaluation she was comfortable.  Patient denies any history of fever, chills, shortness of breath, chest pain, abdomen pain, dysuria, diarrhea, hematochezia or melena. COVID screen test was positive.  She has been fully vaccinated x4.  ED Course: On presentation she was hypotensive, she responded to the IV fluid.COVID screen test, type positive.  WBC count low at 2 with absolute neutrophil count less than 1000.  Patient was afebrile.  He started on broad-spectrum antibiotics, to cover for neutropenia and possible respiratory tract infection associated hemoptysis.  PCCM consulted for hemoptysis, bedside bronchoscopy done.  Chest x-ray did not show pneumonia.  Started on remdesivir for COVID illness  Review of Systems: As per HPI otherwise 10 point review of systems negative.    Past Medical History:   Diagnosis Date  . Cataract   . History of radiation therapy 02/11/19- 04/06/19   bilateral neck nodes and upper mediastinum  . Laryngeal cancer ([place]) 11/2018  . Thyroid disease     Past Surgical History:  Procedure Laterality Date  . CATARACT EXTRACTION, BILATERAL  left 05/20/16, right 06/03/16   Cataract Extraction w/ IOL Implant; Surgeon: Sudie Grumbling, MD; Location: [facility]; Service: Ophthalmology   . CESAREAN SECTION    . ESOPHAGOGASTRODUODENOSCOPY (EGD) WITH PROPOFOL N/A 02/01/2019   Procedure: ESOPHAGOGASTRODUODENOSCOPY (EGD) WITH PROPOFOL;  Surgeon: Milus Banister, MD;  Location: [facility];  Service: Endoscopy;  Laterality: N/A;  . EUS N/A 02/01/2019   Procedure: UPPER ENDOSCOPIC ULTRASOUND (EUS) RADIAL-Will need Linear and Forward Viewing EUS Scope;  Surgeon: Milus Banister, MD;  Location: [facility];  Service: Endoscopy;  Laterality: N/A;  . FINE NEEDLE ASPIRATION N/A 02/01/2019   Procedure: FINE NEEDLE ASPIRATION (FNA) LINEAR;  Surgeon: Milus Banister, MD;  Location: [facility];  Service: Endoscopy;  Laterality: N/A;  . IR GASTROSTOMY TUBE MOD SED  02/09/2019  . IR IMAGING GUIDED PORT INSERTION  02/02/2019  . IR PATIENT EVAL TECH 0-60 MINS  01/06/2020  . IR RADIOLOGIST EVAL & MGMT  04/10/2020  . IR REPLC GASTRO/COLONIC TUBE PERCUT W/FLUORO  12/15/2020  . KNEE CARTILAGE SURGERY Right 2019  . MULTIPLE EXTRACTIONS WITH ALVEOLOPLASTY N/A 10/26/2020   Procedure: MULTIPLE EXTRACTION WITH ALVEOLOPLASTY;  Surgeon: Charlaine Dalton, DMD;  Location: [facility];  Service: Dentistry;  Laterality: N/A;  . TRACHEOSTOMY  12/02/2018   [facility]     reports that she quit smoking about 2  years ago. Her smoking use included cigarettes. She has never used smokeless tobacco. She reports previous alcohol use. She reports that she does not use drugs.  No Known Allergies  Family History  Problem Relation Age of Onset  . Cancer Mother   . Heart attack Father      Prior to Admission  medications   Medication Sig Start Date End Date Taking? Authorizing Provider  amoxicillin (AMOXIL) 500 MG capsule Take 1 capsule (500 mg total) by mouth 2 (two) times daily. 02/09/21   Heath Lark, MD  chlorhexidine (PERIDEX) 0.12 % solution Use as directed 15 mLs in the mouth or throat 2 (two) times daily. Rinse with 15 mls twice daily for 30 seconds. Use after breakfast and at bedtime. Spit out excess. Do not swallow. 11/10/20   Heath Lark, MD  Cholecalciferol (VITAMIN D3) 125 MCG (5000 UT) CAPS Take 5,000 Units by mouth daily.    [provider]  HYDROcodone-acetaminophen (NORCO/VICODIN) 5-325 MG tablet TAKE 1 TABLET BY MOUTH EVERY 6 HOURS AS NEEDED FOR MODERATE PAIN 02/09/21 08/08/21  Heath Lark, MD  levothyroxine (SYNTHROID) 137 MCG tablet Take 137 mcg by mouth daily before breakfast.    [provider]  lidocaine-prilocaine (EMLA) cream Apply to affected area once Patient taking differently: Apply 1 application topically daily as needed (port access). Apply to affected area once 12/14/19   Tish Men, MD  Nutritional Supplements (KATE FARMS PEPTIDE 1.5) LIQD Take 4 Bottles by mouth daily. 04/27/20   Heath Lark, MD  ondansetron (ZOFRAN) 8 MG tablet Take 1 tablet (8 mg total) by mouth 2 (two) times daily as needed (Nausea or vomiting). 12/14/19   Tish Men, MD  prochlorperazine (COMPAZINE) 10 MG tablet Take 1 tablet (10 mg total) by mouth every 6 (six) hours as needed (Nausea or vomiting). 12/14/19   Tish Men, MD  sodium fluoride (PREVIDENT 5000 PLUS) 1.1 % CREA dental cream Apply cream to tooth brush. Brush teeth for 2 minutes. Spit out excess. DO NOT rinse afterwards. Repeat nightly. Patient taking differently: Place 1 application onto teeth 2 (two) times a day. 01/25/19   Lenn Cal, DDS    Physical Exam: Vitals:   02/12/21 1610 02/12/21 1630 02/12/21 1645 02/12/21 1700  BP:  111/71 108/70 102/70  Pulse: 81 89 84 84  Resp: 19 (!) 21 17 15   Temp:      TempSrc:       SpO2: 100% 98% 98% 97%  Weight:      Height:        Constitutional: NAD, calm, comfortable, pleasant female Vitals:   02/12/21 1610 02/12/21 1630 02/12/21 1645 02/12/21 1700  BP:  111/71 108/70 102/70  Pulse: 81 89 84 84  Resp: 19 (!) 21 17 15   Temp:      TempSrc:      SpO2: 100% 98% 98% 97%  Weight:      Height:       Eyes: PERRL, lids and conjunctivae normal ENMT: Mucous membranes are moist.  Tracheostomy Neck: normal, supple, no masses, no thyromegaly Respiratory: clear to auscultation bilaterally, no wheezing, no crackles. Normal respiratory effort. No accessory muscle use.  Cardiovascular: Regular rate and rhythm, no murmurs / rubs / gallops. No extremity edema.  Abdomen: no tenderness, no masses palpated. No hepatosplenomegaly. Bowel sounds positive.  PEG Musculoskeletal: no clubbing / cyanosis. No joint deformity upper and lower extremities.  Skin: no rashes, lesions, ulcers. No induration Neurologic: CN 2-12 grossly intact.  Strength 5/5 in all 4.  Psychiatric: Normal judgment and insight. Alert and oriented x 3. Normal mood.   Foley Catheter:None  Labs on Admission: I have personally reviewed following labs and imaging studies  CBC: Recent Labs  Lab 02/09/21 0748 02/12/21 1314  WBC 2.0* 2.0*  NEUTROABS 1.1* 0.9*  HGB 12.5 12.3  HCT 36.6 36.6  MCV 85.5 86.9  PLT 138* 025   Basic Metabolic Panel: Recent Labs  Lab 02/09/21 0748 02/12/21 1314 02/12/21 1507  NA 136 136 134*  K 4.0 3.9 3.8  CL 99 104 102  CO2 24 24 23   GLUCOSE 96 102* 93  BUN 20 21 19   CREATININE 0.77 0.74 0.65  CALCIUM 8.5* 8.1* 8.0*   GFR: Estimated Creatinine Clearance: 57.5 mL/min (by C-G formula based on SCr of 0.65 mg/dL). Liver Function Tests: Recent Labs  Lab 02/09/21 0748 02/12/21 1507  AST 32 26  ALT 21 20  ALKPHOS 64 55  BILITOT 0.4 0.3  PROT 7.1 6.7  ALBUMIN 3.4* 3.3*   No results for input(s): LIPASE, AMYLASE in the last 168 hours. No results for input(s):  AMMONIA in the last 168 hours. Coagulation Profile: Recent Labs  Lab 02/12/21 1314  INR 1.0   Cardiac Enzymes: No results for input(s): CKTOTAL, CKMB, CKMBINDEX, TROPONINI in the last 168 hours. BNP (last 3 results) No results for input(s): PROBNP in the last 8760 hours. HbA1C: No results for input(s): HGBA1C in the last 72 hours. CBG: No results for input(s): GLUCAP in the last 168 hours. Lipid Profile: No results for input(s): CHOL, HDL, LDLCALC, TRIG, CHOLHDL, LDLDIRECT in the last 72 hours. Thyroid Function Tests: No results for input(s): TSH, T4TOTAL, FREET4, T3FREE, THYROIDAB in the last 72 hours. Anemia Panel: No results for input(s): VITAMINB12, FOLATE, FERRITIN, TIBC, IRON, RETICCTPCT in the last 72 hours. Urine analysis: No results found for: COLORURINE, APPEARANCEUR, LABSPEC, [place], GLUCOSEU, [place], BILIRUBINUR, KETONESUR, PROTEINUR, UROBILINOGEN, NITRITE, LEUKOCYTESUR  Radiological Exams on Admission: DG Chest Port 1 View  Result Date: 02/12/2021 CLINICAL DATA:  Hemoptysis EXAM: PORTABLE CHEST 1 VIEW COMPARISON:  09/07/2020 CT FINDINGS: Cardiac shadow is within normal limits. Right-sided chest wall port is noted in satisfactory position. Postsurgical changes are noted in the neck consistent with the given clinical history of laryngeal carcinoma. Postsurgical changes in the left axilla are noted as well. Lungs are clear bilaterally. No focal infiltrate or effusion is noted. No bony abnormality is seen. Tracheostomy tube is noted in place. IMPRESSION: No acute abnormality noted. Electronically Signed   By: Inez Catalina M.D.   On: 02/12/2021 14:43     Assessment/Plan Principal Problem:   Hemoptysis Active Problems:   Laryngeal squamous cell carcinoma (HCC)   Port-A-Cath in place   Protein malnutrition ([place])   Hypothyroidism   Cancer associated pain   Hemoptysis: Was seen as an outpatient by her PCP and was started on amoxicillin, due to suspicion for respiratory  tract infection associated hemodialysis.  Hemoptysis did  not improve and she presented to the emergency department today.  PCCM was consulted, underwent bedside bronchoscopy with finding of trace endobronchial secretions, no interventional lesions.  Found to have irritated airway with trace dried blood but no obvious source of patient's hemoptysis. Unclear etiology for hemoptysis.  Chest x-ray does not show any pneumonia. CT neck soft tissue/chest have been ordered.  We will follow.  COVID illness : COVID screening test  positive.  Patient is fully vaccinated , she does not know how she was exposed.  She does not have any signs  of pneumonia.  She is not hypoxic and is maintaining her saturation on room air.  Due to her high risks of decompensation due to her history of malignancy and immunocompromise status, will start on remdesivir 3 days course.  No indication for starting any steroid therapy.  Suspicion for sepsis/hypotension/neutropenia: Initially hypotensive on presentation.  Blood pressure improved with IV fluids.  She was given a dose of broad-spectrum antibiotics  in the emergency department.  Check procalcitonin.  Follow-up blood cultures.  Cortisol level is normal .  She is afebrile Patient is also neutropenic.  Leukocyte counts of 2000.  Absolute neutrophil count less than 1000.  Neutropenia could be secondary to chemotherapy or covid.  Continue cefepime for now, will give a dose of Granix.  Laryngeal cancer: Initially diagnosed in 2020 with extensive laryngeal squamous cell carcinoma.  Status post radical neck surgery with laryngectomy.  Status post tracheostomy.She doesn't need oxygen at baseline.  Currently on immunotherapy with Keytruda every 3 weeks.  Follows with oncology  History of hypothyroidism: Continue Synthyroid  History of dysphagia/protein calorie malnutrition: Status post PEG.  Nutrition consulted for starting on PEG feeding  Chronic pain syndrome: Due to cancer.  Continue  pain management, supportive care    Severity of Illness: The appropriate patient status for this patient is OBSERVATION.  DVT prophylaxis: SCD Code Status: Full Family Communication: Son at the bedside Consults called: PCCM following.     Shelly Coss MD [facility]  02/12/2021, 5:50 PM

## 2021-02-12 NOTE — ED Notes (Signed)
Respiratory called to suction trach, 97% and comfortable on room air.

## 2021-02-12 NOTE — ED Notes (Signed)
RN notified of abnormal lab 

## 2021-02-12 NOTE — Sepsis Progress Note (Signed)
elink is following this code sepsis

## 2021-02-12 NOTE — ED Provider Notes (Signed)
  Physical Exam  BP 111/71   Pulse 89   Temp 97.7 F (36.5 C) (Oral)   Resp (!) 21   Ht 5\' 2"  (1.575 m)   Wt 64 kg   SpO2 98%   BMI 25.81 kg/m   Physical Exam  ED Course/Procedures     Procedures  MDM   Please see Dr. Andree Elk note for prior history, physical and care.  Critical care was consulted and performed a bronchoscopy at bedside.  Her COVID test was positive.  They are recommending admission to the hospitalist service.  Remdesivir ordered.  Admitted for further care.    Gareth Morgan, MD 02/13/21 1241

## 2021-02-12 NOTE — Progress Notes (Signed)
A consult was received from an ED physician for vancomycin & cefepime per pharmacy dosing.  The patient's profile has been reviewed for ht/wt/allergies/indication/available labs.   A one time order has been placed for vancomycin 1250 mg IV x 1 and cefepime 2 gm IV x 1.    Further antibiotics/pharmacy consults should be ordered by admitting physician if indicated.                       Thank you,  Eudelia Bunch, Pharm.D 02/12/2021 3:04 PM

## 2021-02-12 NOTE — Progress Notes (Signed)
Pharmacy Antibiotic Note  [name] [name] is a 70 y.o. female admitted on 02/12/2021 with hemoptysis.  Pharmacy has been consulted for cefepime dosing. Covid +, she was given Vanc 1250 x 1, azithromycin 500 x1 and cefepime 2 gm x 1 in the ED.  WBC 2. AF. SCr 0.65.  CXR negative. Bronch: no source of hemoptysis found  Plan: Cefepime 2 gm q8h   Height: 5\' 2"  (157.5 cm) Weight: 64 kg (141 lb 1.5 oz) IBW/kg (Calculated) : 50.1  Temp (24hrs), Avg:97.7 F (36.5 C), Min:97.7 F (36.5 C), Max:97.7 F (36.5 C)  Recent Labs  Lab 02/09/21 0748 02/12/21 1314 02/12/21 1507  WBC 2.0* 2.0*  --   CREATININE 0.77 0.74 0.65  LATICACIDVEN  --   --  0.6    Estimated Creatinine Clearance: 57.5 mL/min (by C-G formula based on SCr of 0.65 mg/dL).    No Known Allergies  Antimicrobials this admission:  5/23 azith x 1 5/23 vanc x1 5/23 cefepime >> 5/23 Remdesivir>>5/27 Dose adjustments this admission:   Microbiology results:  5/23 COVID+ 5/23 BCx1: sent  Thank you for allowing pharmacy to be a part of this patient's care  Eudelia Bunch, Pharm.D 02/12/2021 5:35 PM

## 2021-02-12 NOTE — Consult Note (Addendum)
NAME:  SHARANDA SHINAULT, MRN:  [id number], DOB:  03-23-1951, LOS: 0 ADMISSION DATE:  02/12/2021, CONSULTATION DATE:  5/23 REFERRING MD:  Billy Fischer, CHIEF COMPLAINT:  Hemoptysis    History of Present Illness:  This is a 70 year old female patient who was initially diagnosed with extensive laryngeal cancer back in 2020 she is status post radical neck surgery back in 2020 with laryngectomy she continues to undergo immunotherapy every 3 weeks by oncology.  Presents to the emergency room on 5/23 with 3-day history of hemoptysis.  Denied sick exposure.  Denied any significant shortness of breath.  Denied fever.  Denied chest pain.  Describes the amount of hemoptysis is approximately dime sized nature, this appears old bloody clots, and occurred his frequency is 3-4 times a day.  She was initially seen by her oncologist who was concerned about infection and started her on amoxicillin.  She has been on amoxicillin since 5/20 with no improvement in the symptoms and therefore reported to the emergency room.Chest x-ray was obtained this was actually clear with no clear infiltrate or consolidation.  White blood cell count 2.0.  Was noted to be slightly hypotensive so IV fluids were administered and broad-spectrum antibiotics started.  Pulmonary asked to see because of hemoptysis.   Pertinent  Medical History  Extensive laryngeal squamous cell carcinoma currently undergoing active immunotherapy with Keytruda, patient is status post total glossectomy total pharyngectomy total laryngectomy total thyroidectomy and cervical esophagectomy.  This was completed October 2020 Protein calorie malnutrition, hypothyroidism, leukopenia Dysphagia has PEG Chronic pain due to cancer  Significant Hospital Events: Including procedures, antibiotic start and stop dates in addition to other pertinent events   . 5/20 as outpatient started to have hemoptysis, amoxicillin by oncology . 5/23 no improvement in hemoptysis presented to the  ER started on broad-spectrum antibiotics, underwent bedside airway evaluation by bronchoscopy, CT of the head neck and chest ordered  Interim History / Subjective:  noDistress but anxious  Objective   Blood pressure 93/67, pulse 74, temperature 97.7 F (36.5 C), temperature source Oral, resp. rate 17, height 5\' 2"  (1.575 m), weight 64 kg, SpO2 97 %.       No intake or output data in the 24 hours ending 02/12/21 1533 Filed Weights   02/12/21 1207  Weight: 64 kg    Examination: General: 70 year old white female resting in bed she is in no acute distress currently on room air HENT: His membranes moist.  He has laryngectomy with Lary tube in place, HME in place.  Stoma site is slightly excoriated and tender to Lungs: Clear bilaterally without accessory use Cardiovascular: Regular rate and rhythm Abdomen: Soft nontender PEG site unremarkable Extremities: Warm dry with brisk capillary refill Neuro: Awake oriented x3 writes communication on chalkboard with all extremities without focal deficits GU: Voids spontaneously  Labs/imaging that I have personally reviewed  (right click and "Reselect all SmartList Selections" daily)  See below  Resolved Hospital Problem list     Assessment & Plan:   Hemoptysis (POA).  Etiology unclear differential diagnosis would include possible infection either tracheostomy site tracheobronchitis or pneumonia although current chest x-ray does not suggest this, or also consider reoccurrence of malignancy. She is Covid positive so although she is not hypoxic it could be from this.  Plan We will proceed with bedside bronchoscopy for airway inspection, this should be easy to facilitate given laryngectomy status while there we will also get bronchial alveolar lavage Agree with broad-spectrum antibiotics Will obtain CT neck and chest looking  for malignancy  COVID -19 infection w/out hypoxia Plan Have contacted pharmacy will see about either paxlovid or  remdesivir. Not sure if she can get the paxlovid w/ PEG  Mild hypotension, does not really meet SIRS criteria but certainly sepsis is a consideration.  Plan Continue IV hydration Antibiotics as above Send cortisol  Extensive head neck cancer (POA), status post radical neck with total laryngectomy back in 2020 currently undergoing immunotherapy with Keytruda every 3 weeks last done 5/20 Plan Follow-up CT imaging Outpatient oncology follow-up  History of hypothyroidism Plan Continue Synthroid  History of protein calorie malnutrition(POA) cancer related Plan Nutritional consult  Chronic cancer-related pain (POA) Plan Supportive care  Best practice (right click and "Reselect all SmartList Selections" daily)  Per primary   Labs   CBC: Recent Labs  Lab 02/09/21 0748 02/12/21 1314  WBC 2.0* 2.0*  NEUTROABS 1.1* 0.9*  HGB 12.5 12.3  HCT 36.6 36.6  MCV 85.5 86.9  PLT 138* 185    Basic Metabolic Panel: Recent Labs  Lab 02/09/21 0748 02/12/21 1314  NA 136 136  K 4.0 3.9  CL 99 104  CO2 24 24  GLUCOSE 96 102*  BUN 20 21  CREATININE 0.77 0.74  CALCIUM 8.5* 8.1*   GFR: Estimated Creatinine Clearance: 57.5 mL/min (by C-G formula based on SCr of 0.74 mg/dL). Recent Labs  Lab 02/09/21 0748 02/12/21 1314  WBC 2.0* 2.0*    Liver Function Tests: Recent Labs  Lab 02/09/21 0748  AST 32  ALT 21  ALKPHOS 64  BILITOT 0.4  PROT 7.1  ALBUMIN 3.4*   No results for input(s): LIPASE, AMYLASE in the last 168 hours. No results for input(s): AMMONIA in the last 168 hours.  ABG No results found for: PHART, PCO2ART, PO2ART, HCO3, TCO2, ACIDBASEDEF, O2SAT   Coagulation Profile: Recent Labs  Lab 02/12/21 1314  INR 1.0    Cardiac Enzymes: No results for input(s): CKTOTAL, CKMB, CKMBINDEX, TROPONINI in the last 168 hours.  HbA1C: No results found for: HGBA1C  CBG: No results for input(s): GLUCAP in the last 168 hours.  Review of Systems:   Review of Systems   Constitutional: Positive for chills, fever and weight loss. Negative for malaise/fatigue.  HENT: Negative.   Eyes: Negative.   Respiratory: Positive for cough and hemoptysis.   Cardiovascular: Negative.   Gastrointestinal: Negative.   Genitourinary: Negative.   Musculoskeletal: Negative.   Skin: Positive for rash.  Neurological: Negative.   Endo/Heme/Allergies: Negative.   Psychiatric/Behavioral: Negative.      Past Medical History:  She,  has a past medical history of Cataract, History of radiation therapy (02/11/19- 04/06/19), Laryngeal cancer ([place]) (11/2018), and Thyroid disease.   Surgical History:   Past Surgical History:  Procedure Laterality Date  . CATARACT EXTRACTION, BILATERAL  left 05/20/16, right 06/03/16   Cataract Extraction w/ IOL Implant; Surgeon: Sudie Grumbling, MD; Location: [facility]; Service: Ophthalmology   . CESAREAN SECTION    . ESOPHAGOGASTRODUODENOSCOPY (EGD) WITH PROPOFOL N/A 02/01/2019   Procedure: ESOPHAGOGASTRODUODENOSCOPY (EGD) WITH PROPOFOL;  Surgeon: Milus Banister, MD;  Location: [facility];  Service: Endoscopy;  Laterality: N/A;  . EUS N/A 02/01/2019   Procedure: UPPER ENDOSCOPIC ULTRASOUND (EUS) RADIAL-Will need Linear and Forward Viewing EUS Scope;  Surgeon: Milus Banister, MD;  Location: [facility];  Service: Endoscopy;  Laterality: N/A;  . FINE NEEDLE ASPIRATION N/A 02/01/2019   Procedure: FINE NEEDLE ASPIRATION (FNA) LINEAR;  Surgeon: Milus Banister, MD;  Location[facility];  Service: Endoscopy;  Laterality: N/A;  . IR GASTROSTOMY TUBE MOD SED  02/09/2019  . IR IMAGING GUIDED PORT INSERTION  02/02/2019  . IR PATIENT EVAL TECH 0-60 MINS  01/06/2020  . IR RADIOLOGIST EVAL & MGMT  04/10/2020  . IR REPLC GASTRO/COLONIC TUBE PERCUT W/FLUORO  12/15/2020  . KNEE CARTILAGE SURGERY Right 2019  . MULTIPLE EXTRACTIONS WITH ALVEOLOPLASTY N/A 10/26/2020   Procedure: MULTIPLE EXTRACTION WITH ALVEOLOPLASTY;  Surgeon: Charlaine Dalton, DMD;   Location: [facility];  Service: Dentistry;  Laterality: N/A;  . TRACHEOSTOMY  12/02/2018   [facility]     Social History:   reports that she quit smoking about 2 years ago. Her smoking use included cigarettes. She has never used smokeless tobacco. She reports previous alcohol use. She reports that she does not use drugs.   Family History:  Her family history includes Cancer in her mother; Heart attack in her father.   Allergies No Known Allergies   Home Medications  Prior to Admission medications   Medication Sig Start Date End Date Taking? Authorizing Provider  amoxicillin (AMOXIL) 500 MG capsule Take 1 capsule (500 mg total) by mouth 2 (two) times daily. 02/09/21   Heath Lark, MD  chlorhexidine (PERIDEX) 0.12 % solution Use as directed 15 mLs in the mouth or throat 2 (two) times daily. Rinse with 15 mls twice daily for 30 seconds. Use after breakfast and at bedtime. Spit out excess. Do not swallow. 11/10/20   Heath Lark, MD  Cholecalciferol (VITAMIN D3) 125 MCG (5000 UT) CAPS Take 5,000 Units by mouth daily.    [provider]  HYDROcodone-acetaminophen (NORCO/VICODIN) 5-325 MG tablet TAKE 1 TABLET BY MOUTH EVERY 6 HOURS AS NEEDED FOR MODERATE PAIN 02/09/21 08/08/21  Heath Lark, MD  levothyroxine (SYNTHROID) 137 MCG tablet Take 137 mcg by mouth daily before breakfast.    [provider]  lidocaine-prilocaine (EMLA) cream Apply to affected area once Patient taking differently: Apply 1 application topically daily as needed (port access). Apply to affected area once 12/14/19   Tish Men, MD  Nutritional Supplements (KATE FARMS PEPTIDE 1.5) LIQD Take 4 Bottles by mouth daily. 04/27/20   Heath Lark, MD  ondansetron (ZOFRAN) 8 MG tablet Take 1 tablet (8 mg total) by mouth 2 (two) times daily as needed (Nausea or vomiting). 12/14/19   Tish Men, MD  prochlorperazine (COMPAZINE) 10 MG tablet Take 1 tablet (10 mg total) by mouth every 6 (six) hours as needed (Nausea or vomiting).  12/14/19   Tish Men, MD  sodium fluoride (PREVIDENT 5000 PLUS) 1.1 % CREA dental cream Apply cream to tooth brush. Brush teeth for 2 minutes. Spit out excess. DO NOT rinse afterwards. Repeat nightly. Patient taking differently: Place 1 application onto teeth 2 (two) times a day. 01/25/19   Lenn Cal, [place] ACNP-BC [place] Pager # [phone] OR # [phone] if no answer

## 2021-02-12 NOTE — ED Provider Notes (Signed)
[facility] DEPT Provider Note   CSN: [id number] Arrival date & time: 02/12/21  1150     History Chief Complaint  Patient presents with  . blood clots from trach  . Shortness of Breath    [name] [name] is a 70 y.o. female.  HPI     70 year old female comes in a chief complaint of blood clot in the trach and shortness of breath.  She has history of laryngeal cancer, status post resection 2 years ago.  She was trached at that time.  Since then she has been getting Keytruda infusion.  Patient was doing well until today when she started coughing up some tiny blood clots from her trach site.  She denies any fevers, chills, shortness of breath or chest pain.  Shortness of breath is only present when she is coughing up blood.  She is not on any blood thinners.  She denies any history of PE, DVT.  Past Medical History:  Diagnosis Date  . Cataract   . History of radiation therapy 02/11/19- 04/06/19   bilateral neck nodes and upper mediastinum  . Laryngeal cancer ([place]) 11/2018  . Thyroid disease     Patient Active Problem List   Diagnosis Date Noted  . Laryngitis 02/09/2021  . Leukopenia 02/09/2021  . Dysphagia 01/19/2021  . Lung mass 09/05/2020  . Radiation-induced fibrosis of skin from therapeutic procedure 05/18/2020  . Preventive measure 05/18/2020  . Tracheostomy care ([place]) 03/30/2020  . Hypoparathyroidism ([place]) 02/09/2020  . Cancer associated pain 01/27/2020  . Alaryngeal voice 12/15/2019  . Goals of care, counseling/discussion 07/07/2019  . Tracheostomy dependence ([place]) 04/05/2019  . Hyponatremia 03/31/2019  . Anemia due to antineoplastic chemotherapy 03/03/2019  . Protein malnutrition ([place]) 03/03/2019  . Port-A-Cath in place 02/10/2019  . Laryngeal squamous cell carcinoma ([place]) 01/18/2019  . Acute respiratory failure with hypoxia and hypercapnia ([place]) 12/03/2018  . Hypothyroidism 12/03/2018  . Laryngeal mass 12/03/2018  . Type 2 MI  (myocardial infarction) ([place]) 12/03/2018    Past Surgical History:  Procedure Laterality Date  . CATARACT EXTRACTION, BILATERAL  left 05/20/16, right 06/03/16   Cataract Extraction w/ IOL Implant; Surgeon: Sudie Grumbling, MD; Location: [facility]; Service: Ophthalmology   . CESAREAN SECTION    . ESOPHAGOGASTRODUODENOSCOPY (EGD) WITH PROPOFOL N/A 02/01/2019   Procedure: ESOPHAGOGASTRODUODENOSCOPY (EGD) WITH PROPOFOL;  Surgeon: Milus Banister, MD;  [facility];  Service: Endoscopy;  Laterality: N/A;  . EUS N/A 02/01/2019   Procedure: UPPER ENDOSCOPIC ULTRASOUND (EUS) RADIAL-Will need Linear and Forward Viewing EUS Scope;  Surgeon: Milus Banister, MD;  [facility];  Service: Endoscopy;  Laterality: N/A;  . FINE NEEDLE ASPIRATION N/A 02/01/2019   Procedure: FINE NEEDLE ASPIRATION (FNA) LINEAR;  Surgeon: Milus Banister, MD;  [facility];  Service: Endoscopy;  Laterality: N/A;  . IR GASTROSTOMY TUBE MOD SED  02/09/2019  . IR IMAGING GUIDED PORT INSERTION  02/02/2019  . IR PATIENT EVAL TECH 0-60 MINS  01/06/2020  . IR RADIOLOGIST EVAL & MGMT  04/10/2020  . IR REPLC GASTRO/COLONIC TUBE PERCUT W/FLUORO  12/15/2020  . KNEE CARTILAGE SURGERY Right 2019  . MULTIPLE EXTRACTIONS WITH ALVEOLOPLASTY N/A 10/26/2020   Procedure: MULTIPLE EXTRACTION WITH ALVEOLOPLASTY;  Surgeon: Charlaine Dalton, DMD;  Location: [facility];  Service: Dentistry;  Laterality: N/A;  . TRACHEOSTOMY  12/02/2018   [facility]     OB History   No obstetric history on file.     Family History  Problem Relation  Age of Onset  . Cancer Mother   . Heart attack Father     Social History   Tobacco Use  . Smoking status: Former Smoker    Types: Cigarettes    Quit date: 12/02/2018    Years since quitting: 2.2  . Smokeless tobacco: Never Used  Vaping Use  . Vaping Use: Never used  Substance Use Topics  . Alcohol use: Not Currently  . Drug use: Never    Home Medications Prior to Admission  medications   Medication Sig Start Date End Date Taking? Authorizing Provider  amoxicillin (AMOXIL) 500 MG capsule Take 1 capsule (500 mg total) by mouth 2 (two) times daily. 02/09/21   Heath Lark, MD  chlorhexidine (PERIDEX) 0.12 % solution Use as directed 15 mLs in the mouth or throat 2 (two) times daily. Rinse with 15 mls twice daily for 30 seconds. Use after breakfast and at bedtime. Spit out excess. Do not swallow. 11/10/20   Heath Lark, MD  Cholecalciferol (VITAMIN D3) 125 MCG (5000 UT) CAPS Take 5,000 Units by mouth daily.    [provider]  HYDROcodone-acetaminophen (NORCO/VICODIN) 5-325 MG tablet TAKE 1 TABLET BY MOUTH EVERY 6 HOURS AS NEEDED FOR MODERATE PAIN 02/09/21 08/08/21  Heath Lark, MD  levothyroxine (SYNTHROID) 137 MCG tablet Take 137 mcg by mouth daily before breakfast.    [provider]  lidocaine-prilocaine (EMLA) cream Apply to affected area once Patient taking differently: Apply 1 application topically daily as needed (port access). Apply to affected area once 12/14/19   Tish Men, MD  Nutritional Supplements (KATE FARMS PEPTIDE 1.5) LIQD Take 4 Bottles by mouth daily. 04/27/20   Heath Lark, MD  ondansetron (ZOFRAN) 8 MG tablet Take 1 tablet (8 mg total) by mouth 2 (two) times daily as needed (Nausea or vomiting). 12/14/19   Tish Men, MD  prochlorperazine (COMPAZINE) 10 MG tablet Take 1 tablet (10 mg total) by mouth every 6 (six) hours as needed (Nausea or vomiting). 12/14/19   Tish Men, MD  sodium fluoride (PREVIDENT 5000 PLUS) 1.1 % CREA dental cream Apply cream to tooth brush. Brush teeth for 2 minutes. Spit out excess. DO NOT rinse afterwards. Repeat nightly. Patient taking differently: Place 1 application onto teeth 2 (two) times a day. 01/25/19   Lenn Cal, DDS    Allergies    Patient has no known allergies.  Review of Systems   Review of Systems  Constitutional: Positive for activity change.  Respiratory: Positive for cough and shortness of  breath.   Cardiovascular: Negative for chest pain.  Gastrointestinal: Negative for nausea and vomiting.  Hematological: Does not bruise/bleed easily.  All other systems reviewed and are negative.   Physical Exam Updated Vital Signs BP 111/72 (BP Location: Right Arm)   Pulse 81   Temp 97.7 F (36.5 C) (Oral)   Resp 19   Ht 5\' 2"  (1.575 m)   Wt 64 kg   SpO2 100%   BMI 25.81 kg/m   Physical Exam Vitals and nursing note reviewed.  Constitutional:      Appearance: She is well-developed.  HENT:     Head: Normocephalic and atraumatic.  Eyes:     Pupils: Pupils are equal, round, and reactive to light.  Cardiovascular:     Rate and Rhythm: Normal rate and regular rhythm.     Heart sounds: Normal heart sounds. No murmur heard.   Pulmonary:     Effort: Pulmonary effort is normal. No respiratory distress.  Breath sounds: Examination of the right-lower field reveals rales. Examination of the left-lower field reveals rales. Rales present. No decreased breath sounds or wheezing.  Abdominal:     General: There is no distension.     Palpations: Abdomen is soft.     Tenderness: There is no abdominal tenderness. There is no guarding or rebound.  Musculoskeletal:     Cervical back: Neck supple.  Skin:    General: Skin is warm and dry.  Neurological:     Mental Status: She is alert and oriented to person, place, and time.     ED Results / Procedures / Treatments   Labs (all labs ordered are listed, but only abnormal results are displayed) Labs Reviewed  CBC WITH DIFFERENTIAL/PLATELET - Abnormal; Notable for the following components:      Result Value   WBC 2.0 (*)    Neutro Abs 0.9 (*)    All other components within normal limits  BASIC METABOLIC PANEL - Abnormal; Notable for the following components:   Glucose, Bld 102 (*)    Calcium 8.1 (*)    All other components within normal limits  COMPREHENSIVE METABOLIC PANEL - Abnormal; Notable for the following components:    Sodium 134 (*)    Calcium 8.0 (*)    Albumin 3.3 (*)    All other components within normal limits  RESP PANEL BY RT-PCR (FLU A&B, COVID) ARPGX2  CULTURE, BLOOD (ROUTINE X 2)  CULTURE, BLOOD (ROUTINE X 2)  URINE CULTURE  PROTIME-INR  LACTIC ACID, PLASMA  APTT  LACTIC ACID, PLASMA  URINALYSIS, ROUTINE W REFLEX MICROSCOPIC    EKG None  Radiology DG Chest Port 1 View  Result Date: 02/12/2021 CLINICAL DATA:  Hemoptysis EXAM: PORTABLE CHEST 1 VIEW COMPARISON:  09/07/2020 CT FINDINGS: Cardiac shadow is within normal limits. Right-sided chest wall port is noted in satisfactory position. Postsurgical changes are noted in the neck consistent with the given clinical history of laryngeal carcinoma. Postsurgical changes in the left axilla are noted as well. Lungs are clear bilaterally. No focal infiltrate or effusion is noted. No bony abnormality is seen. Tracheostomy tube is noted in place. IMPRESSION: No acute abnormality noted. Electronically Signed   By: Inez Catalina M.D.   On: 02/12/2021 14:43    Procedures .Critical Care Performed by: Varney Biles, MD Authorized by: Varney Biles, MD   Critical care provider statement:    Critical care time (minutes):  52   Critical care was necessary to treat or prevent imminent or life-threatening deterioration of the following conditions:  Respiratory failure   Critical care was time spent personally by me on the following activities:  Discussions with consultants, evaluation of patient's response to treatment, examination of patient, ordering and performing treatments and interventions, ordering and review of laboratory studies, ordering and review of radiographic studies, pulse oximetry, re-evaluation of patient's condition, obtaining history from patient or surrogate and review of old charts     Medications Ordered in ED Medications  lactated ringers infusion ( Intravenous New Bag/Given 02/12/21 1511)  azithromycin (ZITHROMAX) 500 mg in  sodium chloride 0.9 % 250 mL IVPB (500 mg Intravenous New Bag/Given 02/12/21 1550)  lactated ringers bolus 1,000 mL (1,000 mLs Intravenous New Bag/Given 02/12/21 1518)    And  lactated ringers bolus 1,000 mL (has no administration in time range)  vancomycin (VANCOREADY) IVPB 1250 mg/250 mL (has no administration in time range)  ceFEPIme (MAXIPIME) 2 g in sodium chloride 0.9 % 100 mL IVPB (0 g Intravenous Stopped  02/12/21 1552)  fentaNYL (SUBLIMAZE) injection 25 mcg (25 mcg Intravenous Given 02/12/21 1609)    ED Course  I have reviewed the triage vital signs and the nursing notes.  Pertinent labs & imaging results that were available during my care of the patient were reviewed by me and considered in my medical decision making (see chart for details).    MDM Rules/Calculators/A&P                          70 year old female comes in a chief complaint of hemoptysis.  She is not on any blood thinners.  History is not clearly indicated of infection.  Called respiratory therapist, who suctioned the patient and was mostly getting clear return.  Differential diagnosis would include recurrence of cancer, bleeding due to erosion from the trach itself, bronchitis/pneumonia, PE.  I discussed the case with pulmonary team who is going to come and see the patient.  On my reassessment however, patient was noted to be hypotensive.  She is also slightly neutropenic.  Given this, I have started antibiotics while her work-up is pending and activated code sepsis.  On reassessment, patient's hemodynamics are improved and her blood pressure is better.  ICU team to bronc at the bedside  Final Clinical Impression(s) / ED Diagnoses Final diagnoses:  Hemoptysis  Dyspnea, unspecified type    Rx / DC Orders ED Discharge Orders    None       Varney Biles, MD 02/12/21 1616

## 2021-02-12 NOTE — Procedures (Signed)
Bronchoscopy Procedure Note  [name] [name]  [id number]  [birth year]  Date:02/12/21  Time:4:51 PM   Provider Performing:Sissi Padia S Shearon Stalls   Procedure(s):  Flexible Bronchoscopy [id number])  Indication(s) hemoptysis  Consent Risks of the procedure as well as the alternatives and risks of each were explained to the patient and/or caregiver.  Consent for the procedure was obtained and is signed in the bedside chart  Anesthesia fentanyl   Time Out Verified patient identification, verified procedure, site/side was marked, verified correct patient position, special equipment/implants available, medications/allergies/relevant history reviewed, required imaging and test results available.   Sterile Technique Usual hand hygiene, masks, gowns, and gloves were used   Procedure Description Bronchoscope advanced through laryngectomy tube and into airway.  Airways were examined down to subsegmental level with findings noted below.   Following diagnostic evaluation, Therapeutic aspiration performed in main airways was sent for culture  Findings: trace endobronchial secretions. No endobronchial lesions up to to the first subsegmental airway. Irritated airway with trace dried blood. No obvious source of patient's hemoptysis.    Complications/Tolerance None; patient tolerated the procedure well. Chest X-ray is not needed post procedure.   EBL Minimal   Specimen(s) Tracheal aspirate sent for culture.   Lenice Llamas, MD Pulmonary and [facility]

## 2021-02-12 NOTE — ED Triage Notes (Signed)
BIB EMS from home, C/C coughing up small blood clots from trach since Friday, feels SOB during these episodes.

## 2021-02-13 DIAGNOSIS — E46 Unspecified protein-calorie malnutrition: Secondary | ICD-10-CM | POA: Diagnosis not present

## 2021-02-13 DIAGNOSIS — Y929 Unspecified place or not applicable: Secondary | ICD-10-CM | POA: Diagnosis not present

## 2021-02-13 DIAGNOSIS — Z9841 Cataract extraction status, right eye: Secondary | ICD-10-CM | POA: Diagnosis not present

## 2021-02-13 DIAGNOSIS — C329 Malignant neoplasm of larynx, unspecified: Secondary | ICD-10-CM | POA: Diagnosis not present

## 2021-02-13 DIAGNOSIS — R1310 Dysphagia, unspecified: Secondary | ICD-10-CM | POA: Diagnosis not present

## 2021-02-13 DIAGNOSIS — D701 Agranulocytosis secondary to cancer chemotherapy: Secondary | ICD-10-CM | POA: Diagnosis not present

## 2021-02-13 DIAGNOSIS — Z923 Personal history of irradiation: Secondary | ICD-10-CM | POA: Diagnosis not present

## 2021-02-13 DIAGNOSIS — Z9842 Cataract extraction status, left eye: Secondary | ICD-10-CM | POA: Diagnosis not present

## 2021-02-13 DIAGNOSIS — G893 Neoplasm related pain (acute) (chronic): Secondary | ICD-10-CM | POA: Diagnosis not present

## 2021-02-13 DIAGNOSIS — R042 Hemoptysis: Secondary | ICD-10-CM | POA: Diagnosis not present

## 2021-02-13 DIAGNOSIS — Z79899 Other long term (current) drug therapy: Secondary | ICD-10-CM | POA: Diagnosis not present

## 2021-02-13 DIAGNOSIS — E890 Postprocedural hypothyroidism: Secondary | ICD-10-CM | POA: Diagnosis not present

## 2021-02-13 DIAGNOSIS — Z8521 Personal history of malignant neoplasm of larynx: Secondary | ICD-10-CM | POA: Diagnosis not present

## 2021-02-13 DIAGNOSIS — I959 Hypotension, unspecified: Secondary | ICD-10-CM | POA: Diagnosis not present

## 2021-02-13 DIAGNOSIS — T451X5A Adverse effect of antineoplastic and immunosuppressive drugs, initial encounter: Secondary | ICD-10-CM | POA: Diagnosis not present

## 2021-02-13 DIAGNOSIS — Z6825 Body mass index (BMI) 25.0-25.9, adult: Secondary | ICD-10-CM | POA: Diagnosis not present

## 2021-02-13 DIAGNOSIS — Z931 Gastrostomy status: Secondary | ICD-10-CM | POA: Diagnosis not present

## 2021-02-13 DIAGNOSIS — U071 COVID-19: Secondary | ICD-10-CM | POA: Diagnosis not present

## 2021-02-13 DIAGNOSIS — J9501 Hemorrhage from tracheostomy stoma: Secondary | ICD-10-CM | POA: Diagnosis not present

## 2021-02-13 DIAGNOSIS — Z8249 Family history of ischemic heart disease and other diseases of the circulatory system: Secondary | ICD-10-CM | POA: Diagnosis not present

## 2021-02-13 DIAGNOSIS — Z87891 Personal history of nicotine dependence: Secondary | ICD-10-CM | POA: Diagnosis not present

## 2021-02-13 LAB — CBC WITH DIFFERENTIAL/PLATELET
Abs Immature Granulocytes: 0.04 10*3/uL (ref 0.00–0.07)
Basophils Absolute: 0 10*3/uL (ref 0.0–0.1)
Basophils Relative: 0 %
Eosinophils Absolute: 0.1 10*3/uL (ref 0.0–0.5)
Eosinophils Relative: 2 %
HCT: 33.8 % — ABNORMAL LOW (ref 36.0–46.0)
Hemoglobin: 11 g/dL — ABNORMAL LOW (ref 12.0–15.0)
Immature Granulocytes: 1 %
Lymphocytes Relative: 12 %
Lymphs Abs: 0.8 10*3/uL (ref 0.7–4.0)
MCH: 28.6 pg (ref 26.0–34.0)
MCHC: 32.5 g/dL (ref 30.0–36.0)
MCV: 88 fL (ref 80.0–100.0)
Monocytes Absolute: 0.5 10*3/uL (ref 0.1–1.0)
Monocytes Relative: 7 %
Neutro Abs: 5.4 10*3/uL (ref 1.7–7.7)
Neutrophils Relative %: 78 %
Platelets: 198 10*3/uL (ref 150–400)
RBC: 3.84 MIL/uL — ABNORMAL LOW (ref 3.87–5.11)
RDW: 13.4 % (ref 11.5–15.5)
WBC: 6.8 10*3/uL (ref 4.0–10.5)
nRBC: 0 % (ref 0.0–0.2)

## 2021-02-13 LAB — COMPREHENSIVE METABOLIC PANEL
ALT: 17 U/L (ref 0–44)
AST: 22 U/L (ref 15–41)
Albumin: 2.9 g/dL — ABNORMAL LOW (ref 3.5–5.0)
Alkaline Phosphatase: 53 U/L (ref 38–126)
Anion gap: 6 (ref 5–15)
BUN: 13 mg/dL (ref 8–23)
CO2: 23 mmol/L (ref 22–32)
Calcium: 7.6 mg/dL — ABNORMAL LOW (ref 8.9–10.3)
Chloride: 109 mmol/L (ref 98–111)
Creatinine, Ser: 0.7 mg/dL (ref 0.44–1.00)
GFR, Estimated: >60 mL/min (ref >60)
Glucose, Bld: 85 mg/dL (ref 70–99)
Potassium: 3.6 mmol/L (ref 3.5–5.1)
Sodium: 138 mmol/L (ref 135–145)
Total Bilirubin: 0.6 mg/dL (ref 0.3–1.2)
Total Protein: 5.8 g/dL — ABNORMAL LOW (ref 6.5–8.1)

## 2021-02-13 LAB — D-DIMER, QUANTITATIVE: D-Dimer, Quant: 0.58 ug/mL-FEU — ABNORMAL HIGH (ref 0.00–0.50)

## 2021-02-13 LAB — HIV ANTIBODY (ROUTINE TESTING W REFLEX): HIV Screen 4th Generation wRfx: NONREACTIVE

## 2021-02-13 LAB — C-REACTIVE PROTEIN: CRP: 0.5 mg/dL (ref <1.0)

## 2021-02-13 LAB — GLUCOSE, CAPILLARY
Glucose-Capillary: 130 mg/dL — ABNORMAL HIGH (ref 70–99)
Glucose-Capillary: 105 mg/dL — ABNORMAL HIGH (ref 70–99)

## 2021-02-13 LAB — MAGNESIUM: Magnesium: 2.1 mg/dL (ref 1.7–2.4)

## 2021-02-13 LAB — CORTISOL: Cortisol, Plasma: 9.6 ug/dL

## 2021-02-13 LAB — FERRITIN: Ferritin: 554 ng/mL — ABNORMAL HIGH (ref 11–307)

## 2021-02-13 LAB — PHOSPHORUS: Phosphorus: 3.8 mg/dL (ref 2.5–4.6)

## 2021-02-13 MED ORDER — SODIUM CHLORIDE 0.9 % IV SOLN
2.0000 g | Freq: Two times a day (BID) | INTRAVENOUS | Status: DC
  Administered 2021-02-13 – 2021-02-15 (×5): 2 g via INTRAVENOUS
  Filled 2021-02-13 (×5): qty 2

## 2021-02-13 MED ORDER — CHLORHEXIDINE GLUCONATE CLOTH 2 % EX PADS
6.0000 | MEDICATED_PAD | Freq: Every day | CUTANEOUS | Status: DC
  Administered 2021-02-13 – 2021-02-15 (×3): 6 via TOPICAL

## 2021-02-13 MED ORDER — KATE FARMS STANDARD 1.4 PO LIQD
325.0000 mL | Freq: Every day | ORAL | Status: DC
  Administered 2021-02-13 – 2021-02-15 (×8): 325 mL
  Filled 2021-02-13 (×10): qty 325

## 2021-02-13 MED ORDER — FREE WATER
100.0000 mL | Freq: Every day | Status: DC
  Administered 2021-02-13 – 2021-02-15 (×9): 100 mL

## 2021-02-13 MED ORDER — OSMOLITE 1.2 CAL PO LIQD: 1000.0000 mL | ORAL | Status: DC

## 2021-02-13 MED ORDER — OXYMETAZOLINE HCL 0.05 % NA SOLN
3.0000 | Freq: Two times a day (BID) | NASAL | Status: DC | PRN
  Filled 2021-02-13: qty 15

## 2021-02-13 MED ORDER — MORPHINE SULFATE (PF) 2 MG/ML IV SOLN
2.0000 mg | INTRAVENOUS | Status: DC | PRN
  Administered 2021-02-13: 2 mg via INTRAVENOUS
  Filled 2021-02-13: qty 1

## 2021-02-13 MED ORDER — ORAL CARE MOUTH RINSE
15.0000 mL | Freq: Two times a day (BID) | OROMUCOSAL | Status: DC
  Administered 2021-02-13 – 2021-02-15 (×4): 15 mL via OROMUCOSAL

## 2021-02-13 NOTE — Progress Notes (Signed)
Initial Nutrition Assessment  RD working remotely.  DOCUMENTATION CODES:   Not applicable  INTERVENTION:  - will order 1 carton (325 ml) [employer] 1.4 x5/day with 50 ml free water before and 50 ml after each TF bolus.  - this regimen will provide 2275 kcal, 100 grams protein, and 1670 ml free water.    NUTRITION DIAGNOSIS:   Increased nutrient needs related to acute illness,catabolic illness,cancer and cancer related treatments,chronic illness as evidenced by estimated needs.  GOAL:   Patient will meet greater than or equal to 90% of their needs  MONITOR:   TF tolerance,Labs,Weight trends,Skin  REASON FOR ASSESSMENT:   Consult Enteral/tube feeding initiation and management  ASSESSMENT:   70 y.o. female with medical history of extensive laryngeal cancer(squamous cell carcinoma) diagnosed in 2020 s/p radical neck surgery with laryngectomy and trach and PEG placement, currently on immunotherapy with Keytruda every 3 weeks. She presented to the ED with 3-4 day hx of hemoptysis and cough. CXR did not show PNA. She was incidentally found to be COVID-19 positive in the ED.  She has been NPO since admission. Trach and PEG in place. Able to talk with RN who reports that patient wrote for her that she uses 5 cartons (325 ml) [employer] x5/day.  Review of notes indicates patient was seen by a [place] RD on 02/09/21 at which time patient reported drinking 4-5 cartons (325 ml) [employer] 1.5/day, drinking 2-3 bottles (237 ml) Boost Plus/day, and 8 oz water x5/day.   She was using PEG for medications, but not for nutrition as recently as 02/09/21. Per MD, plan for her to be NPO and nutrition to be provided via PEG at this time d/t current medical course.   She has only been seen by a [place] RD through [facility]; has not been seen by an inpatient RD through [facility].   Weight today is 143 lb and weight yesterday was 141 lb. Weight has been stable/slightly up over the past 4  months. No information documented in the edema section of flow sheet.     Labs reviewed; Ca: 7.6 mg/dl. Medications reviewed; 500 mg ascorbic acid/day, 137 mcg oral synthroid/day, 200 mg IV remdesivir x1 dose 5/23, 100 mg IV remdesivir x1 dose/day 5/24-5/27, 220 mg zinc sulfate/day.     NUTRITION - FOCUSED PHYSICAL EXAM:  unable to complete at this time.   Diet Order:   Diet Order            Diet NPO time specified  Diet effective now                 EDUCATION NEEDS:   No education needs have been identified at this time  Skin:  Skin Assessment: Skin Integrity Issues: Skin Integrity Issues:: Stage I Stage I: sacrum  Last BM:  5/22  Height:   Ht Readings from Last 1 Encounters:  02/13/21 5\' 2"  (1.575 m)    Weight:   Wt Readings from Last 1 Encounters:  02/13/21 65 kg     Estimated Nutritional Needs:  Kcal:  1950-2200 kcal Protein:  95-110 grams Fluid:  >/= 2 L/day     Jarome Matin, MS, RD, LDN, CNSC Inpatient Clinical Dietitian RD pager # available in [place]  After hours/weekend pager # available in [facility]

## 2021-02-13 NOTE — Consult Note (Signed)
ENT CONSULT:  Reason for Consult: Hemoptysis  Referring Physician:  Dr. Tawanna Solo  HPI: [name] [name] is an 70 y.o. 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  She presented to the ED on 02/12/2021 with reports of hemoptysis and cough.  Chest x-ray performed at that time did not demonstrate pneumonia.  Incidentally, she was found to be COVID-19 positive.  Pulmonology was consulted, and performed bronchoscopy which did not reveal an obvious pulmonary etiology for her hemoptysis.  CAT scan of the neck was ordered, to be performed today, but patient was unable to lay flat.   At time of exam patient is awake alert oriented.  She states that she has not had much hemoptysis today.  She states that all bloody drainage has been from her stoma, she denies any intraoral bleeding.  Patient states that she takes all nutrition by G-tube, and only take small sips of water by mouth.  She does not have a TEP, and communicates with writing and mouthing words.  She denies shortness of breath or difficulty breathing.  Past Medical History:  Diagnosis Date  . Cataract   . History of radiation therapy 02/11/19- 04/06/19   bilateral neck nodes and upper mediastinum  . Laryngeal cancer ([place]) 11/2018  . Thyroid disease     Past Surgical History:  Procedure Laterality Date  . CATARACT EXTRACTION, BILATERAL  left 05/20/16, right 06/03/16   Cataract Extraction w/ IOL Implant; Surgeon: Sudie Grumbling, MD; Location: [facility]; Service: Ophthalmology   . CESAREAN SECTION    . ESOPHAGOGASTRODUODENOSCOPY (EGD) WITH PROPOFOL N/A 02/01/2019   Procedure: ESOPHAGOGASTRODUODENOSCOPY (EGD) WITH PROPOFOL;  Surgeon: Milus Banister, MD;  [facility];   Service: Endoscopy;  Laterality: N/A;  . EUS N/A 02/01/2019   Procedure: UPPER ENDOSCOPIC ULTRASOUND (EUS) RADIAL-Will need Linear and Forward Viewing EUS Scope;  Surgeon: Milus Banister, MD;  [facility];  Service: Endoscopy;  Laterality: N/A;  . FINE NEEDLE ASPIRATION N/A 02/01/2019   Procedure: FINE NEEDLE ASPIRATION (FNA) LINEAR;  Surgeon: Milus Banister, MD;  [facility];  Service: Endoscopy;  Laterality: N/A;  . IR GASTROSTOMY TUBE MOD SED  02/09/2019  . IR IMAGING GUIDED PORT INSERTION  02/02/2019  . IR PATIENT EVAL TECH 0-60 MINS  01/06/2020  . IR RADIOLOGIST EVAL & MGMT  04/10/2020  . IR REPLC GASTRO/COLONIC TUBE PERCUT W/FLUORO  12/15/2020  . KNEE CARTILAGE SURGERY Right 2019  . MULTIPLE EXTRACTIONS WITH ALVEOLOPLASTY N/A 10/26/2020   Procedure: MULTIPLE EXTRACTION WITH ALVEOLOPLASTY;  Surgeon: Charlaine Dalton, DMD;  Location: [place];  Service: Dentistry;  Laterality: N/A;  . TRACHEOSTOMY  12/02/2018   [facility]    Family History  Problem Relation Age of Onset  . Cancer Mother   . Heart attack Father     Social History:  reports that she quit smoking about 2 years ago. Her smoking use included cigarettes. She has never used smokeless tobacco. She reports previous alcohol use. She reports that she does not use drugs.  Allergies: No Known Allergies  Medications: I have reviewed the patient's current medications.  Results for orders placed or performed during the hospital encounter of 02/12/21 (from the past 48 hour(s))  CBC with Differential     Status: Abnormal   Collection Time: 02/12/21  1:14  PM  Result Value Ref Range   WBC 2.0 (L) 4.0 - 10.5 K/uL   RBC 4.21 3.87 - 5.11 MIL/uL   Hemoglobin 12.3 12.0 - 15.0 g/dL   HCT 36.6 36.0 - 46.0 %   MCV 86.9 80.0 - 100.0 fL   MCH 29.2 26.0 - 34.0 pg   MCHC 33.6 30.0 - 36.0 g/dL   RDW 13.2 11.5 - 15.5 %   Platelets 188 150 - 400 K/uL   nRBC 0.0 0.0 - 0.2 %   Neutrophils Relative % 42 %   Neutro Abs  0.9 (L) 1.7 - 7.7 K/uL   Lymphocytes Relative 33 %   Lymphs Abs 0.7 0.7 - 4.0 K/uL   Monocytes Relative 19 %   Monocytes Absolute 0.4 0.1 - 1.0 K/uL   Eosinophils Relative 4 %   Eosinophils Absolute 0.1 0.0 - 0.5 K/uL   Basophils Relative 1 %   Basophils Absolute 0.0 0.0 - 0.1 K/uL   Immature Granulocytes 1 %   Abs Immature Granulocytes 0.01 0.00 - 0.07 K/uL    Comment: Performed at [facility], [place] [address] [place], [place] [address]  Basic metabolic panel     Status: Abnormal   Collection Time: 02/12/21  1:14 PM  Result Value Ref Range   Sodium 136 135 - 145 mmol/L   Potassium 3.9 3.5 - 5.1 mmol/L   Chloride 104 98 - 111 mmol/L   CO2 24 22 - 32 mmol/L   Glucose, Bld 102 (H) 70 - 99 mg/dL    Comment: Glucose reference range applies only to samples taken after fasting for at least 8 hours.   BUN 21 8 - 23 mg/dL   Creatinine, Ser 0.74 0.44 - 1.00 mg/dL   Calcium 8.1 (L) 8.9 - 10.3 mg/dL   GFR, Estimated >60 >60 mL/min    Comment: (NOTE) Calculated using the CKD-EPI Creatinine Equation (2021)    Anion gap 8 5 - 15    Comment: Performed at [facility], [place] [address], [place], [place] [address]  Protime-INR     Status: None   Collection Time: 02/12/21  1:14 PM  Result Value Ref Range   Prothrombin Time 12.7 11.4 - 15.2 seconds   INR 1.0 0.8 - 1.2    Comment: (NOTE) INR goal varies based on device and disease states. Performed at [facility], [place] [address] [place], [place] [address]   Resp Panel by RT-PCR (Flu A&B, Covid) Nasopharyngeal Swab     Status: Abnormal   Collection Time: 02/12/21  3:07 PM   Specimen: Nasopharyngeal Swab; Nasopharyngeal(NP) swabs in vial transport medium  Result Value Ref Range   SARS Coronavirus 2 by RT PCR POSITIVE (A) NEGATIVE    Comment: RESULT CALLED TO, READ BACK BY AND VERIFIED WITH:  S. WEST AT 1623 ON 02/12/2021 BY MOSLEY,J (NOTE) SARS-CoV-2 target nucleic acids are  DETECTED.  The SARS-CoV-2 RNA is generally detectable in upper respiratory specimens during the acute phase of infection. Positive results are indicative of the presence of the identified virus, but do not rule out bacterial infection or co-infection with other pathogens not detected by the test. Clinical correlation with patient history and other diagnostic information is necessary to determine patient infection status. The expected result is Negative.  Fact Sheet for Patients: EntrepreneurPulse.com.au  Fact Sheet for Healthcare Providers: IncredibleEmployment.be  This test is not yet approved or cleared by the [place] FDA and  has been authorized for detection and/or  diagnosis of SARS-CoV-2 by FDA under an Emergency Use Authorization (EUA).  This EUA will remain in effect (meaning this tes t can be used) for the duration of  the COVID-19 declaration under Section 564(b)(1) of the Act, 21 U.S.C. section 360bbb-3(b)(1), unless the authorization is terminated or revoked sooner.     Influenza A by PCR NEGATIVE NEGATIVE   Influenza B by PCR NEGATIVE NEGATIVE    Comment: (NOTE) The Xpert Xpress SARS-CoV-2/FLU/RSV plus assay is intended as an aid in the diagnosis of influenza from Nasopharyngeal swab specimens and should not be used as a sole basis for treatment. Nasal washings and aspirates are unacceptable for Xpert Xpress SARS-CoV-2/FLU/RSV testing.  Fact Sheet for Patients: EntrepreneurPulse.com.au  Fact Sheet for Healthcare Providers: IncredibleEmployment.be  This test is not yet approved or cleared by the [place] FDA and has been authorized for detection and/or diagnosis of SARS-CoV-2 by FDA under an Emergency Use Authorization (EUA). This EUA will remain in effect (meaning this test can be used) for the duration of the COVID-19 declaration under Section 564(b)(1) of the Act, 21  U.S.C. section 360bbb-3(b)(1), unless the authorization is terminated or revoked.  Performed at [facility], [place] [address], [place], [place] [address]   Lactic acid, plasma     Status: None   Collection Time: 02/12/21  3:07 PM  Result Value Ref Range   Lactic Acid, Venous 0.6 0.5 - 1.9 mmol/L    Comment: Performed at [facility], [place] [address] [place], [place] [address]  Comprehensive metabolic panel     Status: Abnormal   Collection Time: 02/12/21  3:07 PM  Result Value Ref Range   Sodium 134 (L) 135 - 145 mmol/L   Potassium 3.8 3.5 - 5.1 mmol/L   Chloride 102 98 - 111 mmol/L   CO2 23 22 - 32 mmol/L   Glucose, Bld 93 70 - 99 mg/dL    Comment: Glucose reference range applies only to samples taken after fasting for at least 8 hours.   BUN 19 8 - 23 mg/dL   Creatinine, Ser 0.65 0.44 - 1.00 mg/dL   Calcium 8.0 (L) 8.9 - 10.3 mg/dL   Total Protein 6.7 6.5 - 8.1 g/dL   Albumin 3.3 (L) 3.5 - 5.0 g/dL   AST 26 15 - 41 U/L   ALT 20 0 - 44 U/L   Alkaline Phosphatase 55 38 - 126 U/L   Total Bilirubin 0.3 0.3 - 1.2 mg/dL   GFR, Estimated >60 >60 mL/min    Comment: (NOTE) Calculated using the CKD-EPI Creatinine Equation (2021)    Anion gap 9 5 - 15    Comment: Performed at [facility], [place] [address], [place], [place] [address]  APTT     Status: None   Collection Time: 02/12/21  3:07 PM  Result Value Ref Range   aPTT 29 24 - 36 seconds    Comment: Performed at [facility], [place] [address] [place], [place] [address]  Blood Culture (routine x 2)     Status: None (Preliminary result)   Collection Time: 02/12/21  3:07 PM   Specimen: BLOOD  Result Value Ref Range   Specimen Description      BLOOD PORTA CATH Performed at [place] [address] [place], [place] [address]    Special Requests      BOTTLES DRAWN AEROBIC AND ANAEROBIC Blood Culture adequate volume Performed at  [place]  [address], [place], [place] [address]    Culture      NO GROWTH < 24 HOURS Performed at [place] [address], [place], [place] [address]    Report Status PENDING   Culture, Respiratory w Gram Stain     Status: None (Preliminary result)   Collection Time: 02/12/21  5:00 PM   Specimen: Tracheal Aspirate; Respiratory  Result Value Ref Range   Specimen Description      TRACHEAL ASPIRATE Performed at [place] [address], [place], [place] [address]    Special Requests      NONE Performed at [facility], [place] [address], [place], [place] [address]    Gram Stain      FEW WBC PRESENT, PREDOMINANTLY PMN RARE GRAM POSITIVE COCCI IN CLUSTERS RARE GRAM NEGATIVE RODS    Culture      TOO YOUNG TO READ Performed at [facility], [place] [address], [place], [place] [address]    Report Status PENDING   MRSA PCR Screening     Status: None   Collection Time: 02/12/21  6:33 PM   Specimen: Nasal Mucosa; Nasopharyngeal  Result Value Ref Range   MRSA by PCR NEGATIVE NEGATIVE    Comment:        The GeneXpert MRSA Assay (FDA approved for NASAL specimens only), is one component of a comprehensive MRSA colonization surveillance program. It is not intended to diagnose MRSA infection nor to guide or monitor treatment for MRSA infections. Performed at [facility], [place] [address] [place], [place] [address]   HIV Antibody (routine testing w rflx)     Status: None   Collection Time: 02/12/21  7:00 PM  Result Value Ref Range   HIV Screen 4th Generation wRfx Non Reactive Non Reactive    Comment: Performed at [facility], [place] [address] [place], [place] [address]  C-reactive protein     Status: None   Collection Time: 02/12/21  7:00 PM  Result Value Ref Range   CRP 0.8 <1.0 mg/dL    Comment: Performed at [facility], [place] [address], [place],  [place] [address]  Cortisol, Random     Status: None   Collection Time: 02/12/21  7:00 PM  Result Value Ref Range   Cortisol, Plasma 9.6 ug/dL    Comment: (NOTE) AM    6.7 - 22.6 ug/dL PM   <10.0       ug/dL Performed at [place] [address] [place], [place] [address]   Procalcitonin - Baseline     Status: None   Collection Time: 02/12/21  7:00 PM  Result Value Ref Range   Procalcitonin <0.10 ng/mL    Comment:        Interpretation: PCT (Procalcitonin) <= 0.5 ng/mL: Systemic infection (sepsis) is not likely. Local bacterial infection is possible. (NOTE)       Sepsis PCT Algorithm           Lower Respiratory Tract                                      Infection PCT Algorithm    ----------------------------     ----------------------------         PCT < 0.25 ng/mL                PCT < 0.10  ng/mL          Strongly encourage             Strongly discourage   discontinuation of antibiotics    initiation of antibiotics    ----------------------------     -----------------------------       PCT 0.25 - 0.50 ng/mL            PCT 0.10 - 0.25 ng/mL               OR       >80% decrease in PCT            Discourage initiation of                                            antibiotics      Encourage discontinuation           of antibiotics    ----------------------------     -----------------------------         PCT >= 0.50 ng/mL              PCT 0.26 - 0.50 ng/mL               AND        <80% decrease in PCT             Encourage initiation of                                             antibiotics       Encourage continuation           of antibiotics    ----------------------------     -----------------------------        PCT >= 0.50 ng/mL                  PCT > 0.50 ng/mL               AND         increase in PCT                  Strongly encourage                                      initiation of antibiotics    Strongly encourage escalation           of antibiotics                                      -----------------------------                                           PCT <= 0.25 ng/mL                                                 OR                                        >  80% decrease in PCT                                      Discontinue / Do not initiate                                             antibiotics  Performed at [facility] [address] [place], [place] [address]   Urinalysis, Routine w reflex microscopic Urine, Clean Catch     Status: Abnormal   Collection Time: 02/12/21 10:37 PM  Result Value Ref Range   Color, Urine COLORLESS (A) YELLOW   APPearance CLEAR CLEAR   Specific Gravity, Urine 1.006 1.005 - 1.030   pH 8.0 5.0 - 8.0   Glucose, UA NEGATIVE NEGATIVE mg/dL   Hgb urine dipstick NEGATIVE NEGATIVE   Bilirubin Urine NEGATIVE NEGATIVE   Ketones, ur 5 (A) NEGATIVE mg/dL   Protein, ur NEGATIVE NEGATIVE mg/dL   Nitrite NEGATIVE NEGATIVE   Leukocytes,Ua NEGATIVE NEGATIVE    Comment: Performed at [facility] [address] [place], [place] [address]  CBC with Differential/Platelet     Status: Abnormal   Collection Time: 02/13/21  4:21 AM  Result Value Ref Range   WBC 6.8 4.0 - 10.5 K/uL   RBC 3.84 (L) 3.87 - 5.11 MIL/uL   Hemoglobin 11.0 (L) 12.0 - 15.0 g/dL   HCT 33.8 (L) 36.0 - 46.0 %   MCV 88.0 80.0 - 100.0 fL   MCH 28.6 26.0 - 34.0 pg   MCHC 32.5 30.0 - 36.0 g/dL   RDW 13.4 11.5 - 15.5 %   Platelets 198 150 - 400 K/uL   nRBC 0.0 0.0 - 0.2 %   Neutrophils Relative % 78 %   Neutro Abs 5.4 1.7 - 7.7 K/uL   Lymphocytes Relative 12 %   Lymphs Abs 0.8 0.7 - 4.0 K/uL   Monocytes Relative 7 %   Monocytes Absolute 0.5 0.1 - 1.0 K/uL   Eosinophils Relative 2 %   Eosinophils Absolute 0.1 0.0 - 0.5 K/uL   Basophils Relative 0 %   Basophils Absolute 0.0 0.0 - 0.1 K/uL   Immature Granulocytes 1 %   Abs Immature Granulocytes 0.04 0.00 - 0.07 K/uL    Comment: Performed at [facility], [place] [address], [place], [place] [address]  Comprehensive metabolic panel     Status: Abnormal   Collection Time: 02/13/21  4:21 AM  Result Value Ref Range   Sodium 138 135 - 145 mmol/L   Potassium 3.6 3.5 - 5.1 mmol/L   Chloride 109 98 - 111 mmol/L   CO2 23 22 - 32 mmol/L   Glucose, Bld 85 70 - 99 mg/dL    Comment: Glucose reference range applies only to samples taken after fasting for at least 8 hours.   BUN 13 8 - 23 mg/dL   Creatinine, Ser 0.70 0.44 - 1.00 mg/dL   Calcium 7.6 (L) 8.9 - 10.3 mg/dL   Total Protein 5.8 (L) 6.5 - 8.1 g/dL   Albumin 2.9 (L) 3.5 - 5.0 g/dL   AST 22 15 - 41 U/L   ALT 17 0 - 44 U/L   Alkaline Phosphatase 53 38 - 126 U/L   Total Bilirubin 0.6 0.3 - 1.2  mg/dL   GFR, Estimated >60 >60 mL/min    Comment: (NOTE) Calculated using the CKD-EPI Creatinine Equation (2021)    Anion gap 6 5 - 15    Comment: Performed at [facility], [place] [address] [place], [place] [address]  D-dimer, quantitative     Status: Abnormal   Collection Time: 02/13/21  4:21 AM  Result Value Ref Range   D-Dimer, Quant 0.58 (H) 0.00 - 0.50 ug/mL-FEU    Comment: (NOTE) At the manufacturer cut-off value of 0.5 g/mL FEU, this assay has a negative predictive value of 95-100%.This assay is intended for use in conjunction with a clinical pretest probability (PTP) assessment model to exclude pulmonary embolism (PE) and deep venous thrombosis (DVT) in outpatients suspected of PE or DVT. Results should be correlated with clinical presentation. Performed at [facility], [place] [address] [place], [place] [address]   Magnesium     Status: None   Collection Time: 02/13/21  4:21 AM  Result Value Ref Range   Magnesium 2.1 1.7 - 2.4 mg/dL    Comment: Performed at [facility], [place] [address], [place], [place] [address]  Phosphorus     Status: None   Collection Time: 02/13/21  4:21 AM  Result Value Ref Range    Phosphorus 3.8 2.5 - 4.6 mg/dL    Comment: Performed at [facility], [place] [address] [place], [place] [address]  C-reactive protein     Status: None   Collection Time: 02/13/21  4:22 AM  Result Value Ref Range   CRP 0.5 <1.0 mg/dL    Comment: Performed at [facility], [place] [address], [place], [place] [address]  Ferritin     Status: Abnormal   Collection Time: 02/13/21  4:22 AM  Result Value Ref Range   Ferritin 554 (H) 11 - 307 ng/mL    Comment: Performed at [facility], [place] [address], [place], [place] [address]    DG Chest Port 1 View  Result Date: 02/12/2021 CLINICAL DATA:  Hemoptysis EXAM: PORTABLE CHEST 1 VIEW COMPARISON:  09/07/2020 CT FINDINGS: Cardiac shadow is within normal limits. Right-sided chest wall port is noted in satisfactory position. Postsurgical changes are noted in the neck consistent with the given clinical history of laryngeal carcinoma. Postsurgical changes in the left axilla are noted as well. Lungs are clear bilaterally. No focal infiltrate or effusion is noted. No bony abnormality is seen. Tracheostomy tube is noted in place. IMPRESSION: No acute abnormality noted. Electronically Signed   By: Inez Catalina M.D.   On: 02/12/2021 14:43    ROS:ROS  Blood pressure 120/81, pulse 89, temperature (!) 97.5 F (36.4 C), temperature source Axillary, resp. rate 20, height 5\' 2"  (1.575 m), weight 65 kg, SpO2 96 %.  PHYSICAL EXAM: CONSTITUTIONAL: well developed, nourished, no distress and alert and oriented x 3 PULMONARY/CHEST WALL: effort normal on room air, HME device in place  HENT: Head : normocephalic and atraumatic Ears: Right ear:   canal normal, external ear normal and hearing normal Left ear:   canal normal, external ear normal and hearing normal Nose: nose normal and no purulence Mouth/Throat:  Mouth: Status post total glossectomy, flap reconstruction, no evidence of intraoral bleeding EYES: conjunctiva  normal, EOM normal and PERRL NECK: Laryngectomy stoma patent, with Lary tube in place.  This was removed, and tracheoscopy was performed.  Fiberoptic inspection demonstrated some dried blood around the periphery of the trachea, no obvious source of  bleeding.  After some time, a small trickle of blood was noted from the superior aspect of the stoma.   PROCEDURE: Chemical cauterization of active bleeding using silver nitrate.  Prior to the procedure, patient was counseled regarding possible risks and agreed to proceed.  Silver nitrate was used to control bleeding visualized at the superior aspect of patient's stoma.  Surgical lube was then used to cover the cauterization site.  Hemostasis was achieved, and the new Lary tube was replaced and resecured using ties.  Patient tolerated the procedure well with no complications.   Studies Reviewed: N/A  Assessment/Plan: [name] [name] is an 70 y.o. female with history of cT3N2cM0 SCCa of supraglottic larynx, status post total laryngopharyngectomy, total glossectomy, bilateral modified neck dissection with latissimus dorsi free flap and PEG tube placement on 07/20/2019, currently on immunotherapy with Keytruda every 3 weeks.  She presented to the ED on 02/12/2021 with reports of hemoptysis and cough.   -Complete examination including tracheoscopy demonstrated a pinpoint area of bleeding at the superior aspect of the laryngectomy stoma.  Tried using silver nitrate with achievement of hemostasis.  Surgical lube placed over cautery site and Lary tube replaced.  -Continue diligent stoma care -If bleeding recurs, recommend racemic epinephrine breathing treatment.  This must be applied directly to the stoma, as there is no communication between the nose and mouth and the patient's trachea. -Can also apply Afrin directly to the laryngectomy stoma as needed for intermittent bleeding. -Recommend follow-up with head and neck surgery in [facility] as scheduled. -Medical  management as per primary team  Thank you for allowing me to participate in the care of this patient. Please do not hesitate to contact me with any questions or concerns.   Jason Coop, [facility] Cell: [phone]   02/13/2021, 4:47 PM

## 2021-02-13 NOTE — Progress Notes (Signed)
Patient seen and examined yesterday in ED for hemoptysis. No obvious pulmonary etiology for her hemoptysis after bronchoscopy.  If CT Neck not revealing for head/neck etiology, would have her follow up with her cancer doctors and ENT at [place]. The amount of hemoptysis is fairly small and mixed with mucus.  Would give her return to care precautions with worsening amount and frequency.  She is currently being treated for covid -incidental infection in the setting of being immune compromised. Without hypoxemia, tachycardia, chest pain PE seems less likely as the etiology of her hemoptysis.     Not much more to add from a pulmonary perspective - if persistent hemoptysis while inpatient, and imaging is not revealing of a cause, could ask if ENT can do an NP scope.    Lenice Llamas, MD Pulmonary and [place]

## 2021-02-13 NOTE — Progress Notes (Signed)
Pharmacy Antibiotic Note  [name] [name] is a 70 y.o. female admitted on 02/12/2021 with hemoptysis.  Pharmacy has been consulted for cefepime dosing. Covid +, she was given Vanc 1250 x 1, azithromycin 500 x1 and cefepime 2 gm x 1 in the ED.  WBC 2. AF. SCr 0.65.  CXR negative. Bronch: no source of hemoptysis found  02/13/21 SCr = 0.7 with CrCl = 57 ml/min  Plan: Change Cefepime to 2 gm q12h   Height: 5\' 2"  (157.5 cm) Weight: 64 kg (141 lb 1.5 oz) IBW/kg (Calculated) : 50.1  Temp (24hrs), Avg:97.7 F (36.5 C), Min:97.7 F (36.5 C), Max:97.7 F (36.5 C)  Recent Labs  Lab 02/09/21 0748 02/12/21 1314 02/12/21 1507 02/13/21 0421  WBC 2.0* 2.0*  --  6.8  CREATININE 0.77 0.74 0.65 0.70  LATICACIDVEN  --   --  0.6  --     Estimated Creatinine Clearance: 57.5 mL/min (by C-G formula based on SCr of 0.7 mg/dL).    No Known Allergies  Antimicrobials this admission:  5/23 azith x 1 5/23 vanc x1 5/23 cefepime >> 5/23 Remdesivir>>5/27 Dose adjustments this admission:   Microbiology results:  5/23 COVID+ 5/23 BCx1: sent  Thank you for allowing pharmacy to be a part of this patient's care  Leone Haven, PharmD 02/13/2021 5:33 AM

## 2021-02-13 NOTE — Progress Notes (Signed)
PROGRESS NOTE    LAVELLE BERLAND  KDT:[id number] DOB: [birth year] DOA: 02/12/2021 PCP: Leonides Sake, MD   Chief Complain: Hemoptysis  Brief Narrative:[name] [name] is a 70 y.o. female with medical history significant of extensive laryngeal cancer(squamous cell carcinoma) diagnosed in 2020 status post radical neck surgery with laryngectomy, currently on immunotherapy with Keytruda every 3 weeks and follows with oncology presents to the emergency department with complaints of 3-day history of hemoptysis.  Patient reported passage of blood clots in her sputum and it happened 3-4 times a day.  She was also having cough.  She was seen by her oncologist, Dr. Alvy Bimler, as an outpatient, suspected to have respiratory infection that could have contributed to the hemoptysis and was started on amoxicillin on 5/20 but it did not help.  She is a status post trach and PEG.He started on broad-spectrum antibiotics, to cover for neutropenia and possible respiratory tract infection associated hemoptysis.  PCCM consulted for hemoptysis, bedside bronchoscopy done.  Chest x-ray did not show pneumonia.  COVID screen test positive. Started on remdesivir for incidental positive COVID.  Plan for CT soft tissue neck/CT chest.  Assessment & Plan:   Principal Problem:   Hemoptysis Active Problems:   Laryngeal squamous cell carcinoma (HCC)   Port-A-Cath in place   Protein malnutrition ([place])   Hypothyroidism   Cancer associated pain   Hemoptysis: Was seen as an outpatient by her PCP and was started on amoxicillin, due to suspicion for respiratory tract infection associated hemodialysis.  Hemoptysis did  not improve and she presented to the emergency department.  PCCM was consulted, underwent bedside bronchoscopy with finding of trace endobronchial secretions, no interventional lesions.  Found to have irritated airway with trace dried blood but no obvious source of patient's hemoptysis. Unclear etiology for hemoptysis.   Chest x-ray does not show any pneumonia. CT neck soft tissue/chest have been ordered.  We will follow. If she has persistent hemoptysis while inpatient and imagings did not reveal a significant finding, we might need to consult ENT. Her hemoglobin has remained stable and she does not have significant hemoptysis after this hospitalization but she has seen some tinge of blood in the tissue  COVID infection: COVID screening test  positive.  Patient is fully vaccinated , she does not know how she was exposed.  She does not have any signs of pneumonia.  She is not hypoxic and is maintaining her saturation on room air.  Due to her high risks of decompensation due to her history of malignancy and immunocompromise status, started on remdesivir 3 days course.  No indication for starting any steroid therapy.  Suspicion for sepsis/hypotension/neutropenia: Initially hypotensive on presentation.  Blood pressure improved with IV fluids.  She was given a dose of broad-spectrum antibiotics  in the emergency department.  negatuve procalcitonin.  Follow-up blood cultures.  Cortisol level is normal .  She is afebrile Patient was also neutropenic on presentation with leukocyte counts of 2000.  Absolute neutrophil count less than 1000.  Neutropenia could be secondary to chemotherapy or covid.  Continue cefepime for now,follow up cultures, hopefully the antibiotics will be discontinued 7.  Status post a dose of Granix on 02/12/21  Laryngeal cancer: Initially diagnosed in 2020 with extensive laryngeal squamous cell carcinoma.  Status post radical neck surgery with laryngectomy.  Status post tracheostomy.She doesn't need oxygen at baseline.  Currently on immunotherapy with Keytruda every 3 weeks.  Follows with oncology  History of hypothyroidism: Continue Synthyroid  History of dysphagia/protein  calorie malnutrition: Status post PEG.  Nutrition consulted for starting on PEG feeding  Chronic pain syndrome: Due to  cancer.  Continue pain management, supportive care          DVT prophylaxis:SCD Code Status: Full Family Communication: Son at bedside on 02/12/21 Status is: Observation   Dispo: The patient is from: Home              Anticipated d/c is to: Home              Patient currently is not medically stable to d/c.   Difficult to place patient No    Consultants: PCCM  Procedures: Bedside bronchoscopy  Antimicrobials:  Anti-infectives (From admission, onward)   Start     Dose/Rate Route Frequency Ordered Stop   02/13/21 1000  remdesivir 100 mg in sodium chloride 0.9 % 100 mL IVPB       "Followed by" Linked Group Details   100 mg 200 mL/hr over 30 Minutes Intravenous Daily 02/12/21 1712 02/17/21 0959   02/13/21 1000  ceFEPIme (MAXIPIME) 2 g in sodium chloride 0.9 % 100 mL IVPB        2 g 200 mL/hr over 30 Minutes Intravenous Every 12 hours 02/13/21 0532     02/12/21 2300  ceFEPIme (MAXIPIME) 2 g in sodium chloride 0.9 % 100 mL IVPB  Status:  Discontinued        2 g 200 mL/hr over 30 Minutes Intravenous Every 8 hours 02/12/21 1733 02/13/21 0532   02/12/21 1715  remdesivir 200 mg in sodium chloride 0.9% 250 mL IVPB       "Followed by" Linked Group Details   200 mg 580 mL/hr over 30 Minutes Intravenous Once 02/12/21 1712 02/12/21 1900   02/12/21 1515  vancomycin (VANCOREADY) IVPB 1250 mg/250 mL        1,250 mg 166.7 mL/hr over 90 Minutes Intravenous  Once 02/12/21 1502 02/12/21 1827   02/12/21 1500  vancomycin (VANCOCIN) IVPB 1000 mg/200 mL premix  Status:  Discontinued        1,000 mg 200 mL/hr over 60 Minutes Intravenous  Once 02/12/21 1457 02/12/21 1502   02/12/21 1500  ceFEPIme (MAXIPIME) 2 g in sodium chloride 0.9 % 100 mL IVPB        2 g 200 mL/hr over 30 Minutes Intravenous  Once 02/12/21 1457 02/12/21 1552   02/12/21 1500  azithromycin (ZITHROMAX) 500 mg in sodium chloride 0.9 % 250 mL IVPB        500 mg 250 mL/hr over 60 Minutes Intravenous  Once 02/12/21 1457  02/12/21 1655      Subjective:  Patient seen and examined at the bedside this morning.  Hemodynamically  stable.  On room air.  Still complaining of seeing some blood in the tissue through her tracheostomy.  No significant bleeding observed.  Hemoglobin has remained stable.  She says she had difficulty for doing CT scan because she could not lie flat, I have encouraged her that these tests are necessary to find out the etiology for hemoptysis.   Objective: Vitals:   02/13/21 0930 02/13/21 1000 02/13/21 1200 02/13/21 1314  BP:  107/63 108/62 120/81  Pulse:  77 78 89  Resp:  14 16 20   Temp:    (!) 97.5 F (36.4 C)  TempSrc:    Oral  SpO2: 99% 97% 98% 96%  Weight:      Height:        Intake/Output Summary (Last 24 hours) at 02/13/2021 1317  Last data filed at 02/12/2021 2257 Gross per 24 hour  Intake 3940.02 ml  Output --  Net 3940.02 ml   Filed Weights   02/12/21 1207  Weight: 64 kg    Examination:  General exam: Overall comfortable, not in distress, pleasant female HEENT: Tracheostomy Respiratory system:  no wheezes or crackles  Cardiovascular system: S1 & S2 heard, RRR.  Gastrointestinal system: Abdomen is nondistended, soft and nontender.  PEG Central nervous system: Alert and oriented Extremities: No edema, no clubbing ,no cyanosis Skin: No rashes, no ulcers,no icterus      Data Reviewed: I have personally reviewed following labs and imaging studies  CBC: Recent Labs  Lab 02/09/21 0748 02/12/21 1314 02/13/21 0421  WBC 2.0* 2.0* 6.8  NEUTROABS 1.1* 0.9* 5.4  HGB 12.5 12.3 11.0*  HCT 36.6 36.6 33.8*  MCV 85.5 86.9 88.0  PLT 138* 188 562   Basic Metabolic Panel: Recent Labs  Lab 02/09/21 0748 02/12/21 1314 02/12/21 1507 02/13/21 0421  NA 136 136 134* 138  K 4.0 3.9 3.8 3.6  CL 99 104 102 109  CO2 24 24 23 23   GLUCOSE 96 102* 93 85  BUN 20 21 19 13   CREATININE 0.77 0.74 0.65 0.70  CALCIUM 8.5* 8.1* 8.0* 7.6*  MG  --   --   --  2.1  PHOS  --    --   --  3.8   GFR: Estimated Creatinine Clearance: 57.5 mL/min (by C-G formula based on SCr of 0.7 mg/dL). Liver Function Tests: Recent Labs  Lab 02/09/21 0748 02/12/21 1507 02/13/21 0421  AST 32 26 22  ALT 21 20 17   ALKPHOS 64 55 53  BILITOT 0.4 0.3 0.6  PROT 7.1 6.7 5.8*  ALBUMIN 3.4* 3.3* 2.9*   No results for input(s): LIPASE, AMYLASE in the last 168 hours. No results for input(s): AMMONIA in the last 168 hours. Coagulation Profile: Recent Labs  Lab 02/12/21 1314  INR 1.0   Cardiac Enzymes: No results for input(s): CKTOTAL, CKMB, CKMBINDEX, TROPONINI in the last 168 hours. BNP (last 3 results) No results for input(s): PROBNP in the last 8760 hours. HbA1C: No results for input(s): HGBA1C in the last 72 hours. CBG: No results for input(s): GLUCAP in the last 168 hours. Lipid Profile: No results for input(s): CHOL, HDL, LDLCALC, TRIG, CHOLHDL, LDLDIRECT in the last 72 hours. Thyroid Function Tests: No results for input(s): TSH, T4TOTAL, FREET4, T3FREE, THYROIDAB in the last 72 hours. Anemia Panel: Recent Labs    02/13/21 0422  FERRITIN 554*   Sepsis Labs: Recent Labs  Lab 02/12/21 1507 02/12/21 1900  PROCALCITON  --  <0.10  LATICACIDVEN 0.6  --     Recent Results (from the past 240 hour(s))  Resp Panel by RT-PCR (Flu A&B, Covid) Nasopharyngeal Swab     Status: Abnormal   Collection Time: 02/12/21  3:07 PM   Specimen: Nasopharyngeal Swab; Nasopharyngeal(NP) swabs in vial transport medium  Result Value Ref Range Status   SARS Coronavirus 2 by RT PCR POSITIVE (A) NEGATIVE Final    Comment: RESULT CALLED TO, READ BACK BY AND VERIFIED WITH:  S. WEST AT 1623 ON 02/12/2021 BY MOSLEY,J (NOTE) SARS-CoV-2 target nucleic acids are DETECTED.  The SARS-CoV-2 RNA is generally detectable in upper respiratory specimens during the acute phase of infection. Positive results are indicative of the presence of the identified virus, but do not rule out bacterial infection  or co-infection with other pathogens not detected by the test. Clinical correlation with  patient history and other diagnostic information is necessary to determine patient infection status. The expected result is Negative.  Fact Sheet for Patients: EntrepreneurPulse.com.au  Fact Sheet for Healthcare Providers: IncredibleEmployment.be  This test is not yet approved or cleared by the [place] FDA and  has been authorized for detection and/or diagnosis of SARS-CoV-2 by FDA under an Emergency Use Authorization (EUA).  This EUA will remain in effect (meaning this tes t can be used) for the duration of  the COVID-19 declaration under Section 564(b)(1) of the Act, 21 U.S.C. section 360bbb-3(b)(1), unless the authorization is terminated or revoked sooner.     Influenza A by PCR NEGATIVE NEGATIVE Final   Influenza B by PCR NEGATIVE NEGATIVE Final    Comment: (NOTE) The Xpert Xpress SARS-CoV-2/FLU/RSV plus assay is intended as an aid in the diagnosis of influenza from Nasopharyngeal swab specimens and should not be used as a sole basis for treatment. Nasal washings and aspirates are unacceptable for Xpert Xpress SARS-CoV-2/FLU/RSV testing.  Fact Sheet for Patients: EntrepreneurPulse.com.au  Fact Sheet for Healthcare Providers: IncredibleEmployment.be  This test is not yet approved or cleared by the [place] FDA and has been authorized for detection and/or diagnosis of SARS-CoV-2 by FDA under an Emergency Use Authorization (EUA). This EUA will remain in effect (meaning this test can be used) for the duration of the COVID-19 declaration under Section 564(b)(1) of the Act, 21 U.S.C. section 360bbb-3(b)(1), unless the authorization is terminated or revoked.  Performed at [facility], [place] [address], [place], [place] [address]   Blood Culture (routine x 2)     Status: None (Preliminary  result)   Collection Time: 02/12/21  3:07 PM   Specimen: BLOOD  Result Value Ref Range Status   Specimen Description   Final    BLOOD PORTA CATH Performed at [facility] [address], [place], [place] [address]    Special Requests   Final    BOTTLES DRAWN AEROBIC AND ANAEROBIC Blood Culture adequate volume Performed at [facility] [address] [place], [place] [address]    Culture   Final    NO GROWTH < 24 HOURS Performed at [facility] [address] [place], [place] [address]    Report Status PENDING  Incomplete  Culture, Respiratory w Gram Stain     Status: None (Preliminary result)   Collection Time: 02/12/21  5:00 PM   Specimen: Tracheal Aspirate; Respiratory  Result Value Ref Range Status   Specimen Description   Final    TRACHEAL ASPIRATE Performed at [facility] [address] [place], [place] [address]    Special Requests   Final    NONE Performed at [facility], [place] [address], [place], [place] [address]    Gram Stain   Final    FEW WBC PRESENT, PREDOMINANTLY PMN RARE GRAM POSITIVE COCCI IN CLUSTERS RARE GRAM NEGATIVE RODS    Culture   Final    TOO YOUNG TO READ Performed at [facility], [place] [address], [place], [place] [address]    Report Status PENDING  Incomplete  MRSA PCR Screening     Status: None   Collection Time: 02/12/21  6:33 PM   Specimen: Nasal Mucosa; Nasopharyngeal  Result Value Ref Range Status   MRSA by PCR NEGATIVE NEGATIVE Final    Comment:        The GeneXpert MRSA Assay (FDA approved for NASAL specimens only), is  one component of a comprehensive MRSA colonization surveillance program. It is not intended to diagnose MRSA infection nor to guide or monitor treatment for MRSA infections. Performed at [facility], [place] [address] [place], [place] [address]          Radiology Studies: [facility] Chest Port 1  View  Result Date: 02/12/2021 CLINICAL DATA:  Hemoptysis EXAM: PORTABLE CHEST 1 VIEW COMPARISON:  09/07/2020 CT FINDINGS: Cardiac shadow is within normal limits. Right-sided chest wall port is noted in satisfactory position. Postsurgical changes are noted in the neck consistent with the given clinical history of laryngeal carcinoma. Postsurgical changes in the left axilla are noted as well. Lungs are clear bilaterally. No focal infiltrate or effusion is noted. No bony abnormality is seen. Tracheostomy tube is noted in place. IMPRESSION: No acute abnormality noted. Electronically Signed   By: Inez Catalina M.D.   On: 02/12/2021 14:43        Scheduled Meds: . vitamin C  500 mg Oral Daily  . levothyroxine  137 mcg Oral QAC breakfast  . zinc sulfate  220 mg Oral Daily   Continuous Infusions: . ceFEPime (MAXIPIME) IV Stopped (02/13/21 1109)  . remdesivir 100 mg in NS 100 mL Stopped (02/13/21 1109)     LOS: 0 days    Time spent: 25 mins.More than 50% of that time was spent in counseling and/or coordination of care.      Shelly Coss, MD [facility] P5/24/2022, 1:17 PM

## 2021-02-13 NOTE — Progress Notes (Signed)
Attempted to scan patient three times.  Pt unable to lie flat each time. 02/12/21 @ 1715 02/13/21 @ 0245 02/13/21 @1145  ER RN made aware

## 2021-02-14 DIAGNOSIS — L8990 Pressure ulcer of unspecified site, unspecified stage: Secondary | ICD-10-CM | POA: Insufficient documentation

## 2021-02-14 DIAGNOSIS — R042 Hemoptysis: Secondary | ICD-10-CM | POA: Diagnosis not present

## 2021-02-14 LAB — CBC WITH DIFFERENTIAL/PLATELET
Abs Immature Granulocytes: 0.2 10*3/uL — ABNORMAL HIGH (ref 0.00–0.07)
Band Neutrophils: 5 %
Basophils Absolute: 0 10*3/uL (ref 0.0–0.1)
Basophils Relative: 0 %
Eosinophils Absolute: 0.4 10*3/uL (ref 0.0–0.5)
Eosinophils Relative: 2 %
HCT: 35.2 % — ABNORMAL LOW (ref 36.0–46.0)
Hemoglobin: 11.3 g/dL — ABNORMAL LOW (ref 12.0–15.0)
Lymphocytes Relative: 4 %
Lymphs Abs: 0.7 10*3/uL (ref 0.7–4.0)
MCH: 28.7 pg (ref 26.0–34.0)
MCHC: 32.1 g/dL (ref 30.0–36.0)
MCV: 89.3 fL (ref 80.0–100.0)
Metamyelocytes Relative: 1 %
Monocytes Absolute: 0.7 10*3/uL (ref 0.1–1.0)
Monocytes Relative: 4 %
Neutro Abs: 15.9 10*3/uL — ABNORMAL HIGH (ref 1.7–7.7)
Neutrophils Relative %: 84 %
Platelets: 200 10*3/uL (ref 150–400)
RBC: 3.94 MIL/uL (ref 3.87–5.11)
RDW: 13.3 % (ref 11.5–15.5)
WBC: 17.9 10*3/uL — ABNORMAL HIGH (ref 4.0–10.5)
nRBC: 0 % (ref 0.0–0.2)

## 2021-02-14 LAB — COMPREHENSIVE METABOLIC PANEL
ALT: 15 U/L (ref 0–44)
AST: 22 U/L (ref 15–41)
Albumin: 3.1 g/dL — ABNORMAL LOW (ref 3.5–5.0)
Alkaline Phosphatase: 63 U/L (ref 38–126)
Anion gap: 9 (ref 5–15)
BUN: 15 mg/dL (ref 8–23)
CO2: 26 mmol/L (ref 22–32)
Calcium: 8.1 mg/dL — ABNORMAL LOW (ref 8.9–10.3)
Chloride: 106 mmol/L (ref 98–111)
Creatinine, Ser: 0.81 mg/dL (ref 0.44–1.00)
GFR, Estimated: >60 mL/min (ref >60)
Glucose, Bld: 90 mg/dL (ref 70–99)
Potassium: 4 mmol/L (ref 3.5–5.1)
Sodium: 141 mmol/L (ref 135–145)
Total Bilirubin: 0.5 mg/dL (ref 0.3–1.2)
Total Protein: 6.2 g/dL — ABNORMAL LOW (ref 6.5–8.1)

## 2021-02-14 LAB — URINE CULTURE

## 2021-02-14 LAB — D-DIMER, QUANTITATIVE: D-Dimer, Quant: 0.56 ug/mL-FEU — ABNORMAL HIGH (ref 0.00–0.50)

## 2021-02-14 LAB — C-REACTIVE PROTEIN: CRP: 1.3 mg/dL — ABNORMAL HIGH (ref <1.0)

## 2021-02-14 LAB — FERRITIN: Ferritin: 507 ng/mL — ABNORMAL HIGH (ref 11–307)

## 2021-02-14 LAB — GLUCOSE, CAPILLARY
Glucose-Capillary: 101 mg/dL — ABNORMAL HIGH (ref 70–99)
Glucose-Capillary: 107 mg/dL — ABNORMAL HIGH (ref 70–99)
Glucose-Capillary: 115 mg/dL — ABNORMAL HIGH (ref 70–99)
Glucose-Capillary: 123 mg/dL — ABNORMAL HIGH (ref 70–99)
Glucose-Capillary: 128 mg/dL — ABNORMAL HIGH (ref 70–99)
Glucose-Capillary: 82 mg/dL (ref 70–99)
Glucose-Capillary: 93 mg/dL (ref 70–99)

## 2021-02-14 LAB — MAGNESIUM: Magnesium: 2.3 mg/dL (ref 1.7–2.4)

## 2021-02-14 LAB — PHOSPHORUS: Phosphorus: 3.9 mg/dL (ref 2.5–4.6)

## 2021-02-14 NOTE — Progress Notes (Signed)
PROGRESS NOTE    [name] [name]  XBD:[id number] DOB: Dec 16, 1950 DOA: 02/12/2021 PCP: Leonides Sake, MD   Brief Narrative:  This 70 y.o.femalewith PMH significant ofextensive laryngeal cancer(squamous cell carcinoma)diagnosed in 2020 status post radical neck surgery with laryngectomy, currently on immunotherapy with Keytruda every 3 weeks and follows with oncology presents to the emergency department with complaints of 3-day history of hemoptysis. Patient reported passage of blood clots in her sputum and it happened 3-4 times a day. She was also having cough. She was seen by her oncologist, Dr. Burna Callas an outpatient, suspected to have respiratory infection that could have contributed to the hemoptysis and was started on amoxicillin on 5/20 but it did not help. She is status post trach and PEG. She was started on broad-spectrum antibiotics,to cover for neutropenia and possible respiratory tract infection associated hemoptysis. PCCM consulted for hemoptysis, bedside bronchoscopy done which did not reveal any obvious pulmonary etiology for hemoptysis.Chest x-ray did not show pneumonia.   ENT was consulted,  patient had tracheoscopy which demonstrated pinpoint area of bleeding at the laryngectomy stoma. COVID screen test positive.Started on remdesivir for incidental positive COVID.    Assessment & Plan:   Principal Problem:   Hemoptysis Active Problems:   Laryngeal squamous cell carcinoma (HCC)   Port-A-Cath in place   Protein malnutrition ([place])   Hypothyroidism   Cancer associated pain   Pressure injury of skin   Hemoptysis: > Improved. She was seen as an outpatient by her PCP and was started on amoxicillin,due to suspicion for respiratory tract infection associated hemoptysis. Itnot improve and she presented to the emergency department. PCCM was consulted,underwent bedside bronchoscopy with finding of trace endobronchial secretions, no interventional lesions. Found to have  irritated airway with trace dried blood but no obvious source of patient's hemoptysis found. Unclearetiology for hemoptysis. Chest x-ray does not show any pneumonia. ENT consulted, patient had tracheoscopy which demonstrated pinpoint area of bleeding at the laryngectomy stoma.  Bleeding was stopped with silver nitrate. If bleeding recurs recommended racemic epinephrine breathing treatment. Her hemoglobin has remained stable and she does not have significant hemoptysis after this hospitalization but she has seen some tinge of blood in the tissue.  COVID infection: COVID screening test positive. Patient is fully vaccinated,she does not know how she was exposed. She does not have any signs of pneumonia. She is not hypoxic and is maintaining her saturation on room air. Due to her high risks of decompensation due to her history of malignancy and immunocompromise status, started on remdesivir 3 days course. No indication for starting any steroid therapy.  Suspicion for sepsis/hypotension/neutropenia: Initially hypotensive on presentation. Blood pressure improved with IV fluids. She was given a dose of broad-spectrum antibiotics in the emergency department. negative procalcitonin.  Blood cultures no growth so far. Cortisol level is normal .She is afebrile Patient was also neutropenic on presentation with leukocyte counts of 2000. Absolute neutrophil count less than 1000. Neutropenia could be secondary to chemotherapy or covid.Continue cefepimefor now. Hopefully the antibiotics will be discontinued.  Status post a dose of Granix on 02/12/21.  Laryngeal cancer: Initially diagnosed in2020 with extensive laryngeal squamous cell carcinoma. Status post radical neck surgery with laryngectomy. Status post tracheostomy.She doesn'tneed oxygen at baseline.Currently on immunotherapy with Keytruda every 3 weeks. Follows with oncology  History of hypothyroidism:Continue  Synthyroid  History of dysphagia/protein calorie malnutrition: Status postPEG.Nutrition consulted for starting on PEG feeding  Chronic pain syndrome: Due to cancer. Continue pain management, supportive care    DVT prophylaxis:SCDs  Code Status: Full code. Family Communication:  No family at bed side. Disposition Plan:   Status is: Inpatient  Remains inpatient appropriate because:Inpatient level of care appropriate due to severity of illness   Dispo: The patient is from: Home              Anticipated d/c is to: Home              Patient currently is not medically stable to d/c.   Difficult to place patient No   Consultants:   Pulmonology  ENT  Procedures: Bedside bronchoscopy Antimicrobials  Anti-infectives (From admission, onward)   Start     Dose/Rate Route Frequency Ordered Stop   02/13/21 1000  remdesivir 100 mg in sodium chloride 0.9 % 100 mL IVPB       "Followed by" Linked Group Details   100 mg 200 mL/hr over 30 Minutes Intravenous Daily 02/12/21 1712 02/17/21 0959   02/13/21 1000  ceFEPIme (MAXIPIME) 2 g in sodium chloride 0.9 % 100 mL IVPB        2 g 200 mL/hr over 30 Minutes Intravenous Every 12 hours 02/13/21 0532     02/12/21 2300  ceFEPIme (MAXIPIME) 2 g in sodium chloride 0.9 % 100 mL IVPB  Status:  Discontinued        2 g 200 mL/hr over 30 Minutes Intravenous Every 8 hours 02/12/21 1733 02/13/21 0532   02/12/21 1715  remdesivir 200 mg in sodium chloride 0.9% 250 mL IVPB       "Followed by" Linked Group Details   200 mg 580 mL/hr over 30 Minutes Intravenous Once 02/12/21 1712 02/12/21 1900   02/12/21 1515  vancomycin (VANCOREADY) IVPB 1250 mg/250 mL        1,250 mg 166.7 mL/hr over 90 Minutes Intravenous  Once 02/12/21 1502 02/12/21 1827   02/12/21 1500  vancomycin (VANCOCIN) IVPB 1000 mg/200 mL premix  Status:  Discontinued        1,000 mg 200 mL/hr over 60 Minutes Intravenous  Once 02/12/21 1457 02/12/21 1502   02/12/21 1500  ceFEPIme  (MAXIPIME) 2 g in sodium chloride 0.9 % 100 mL IVPB        2 g 200 mL/hr over 30 Minutes Intravenous  Once 02/12/21 1457 02/12/21 1552   02/12/21 1500  azithromycin (ZITHROMAX) 500 mg in sodium chloride 0.9 % 250 mL IVPB        500 mg 250 mL/hr over 60 Minutes Intravenous  Once 02/12/21 1457 02/12/21 1655      Subjective: Patient was seen and examined at bedside.  Overnight events noted.  Patient reports feeling better. She denies any further bleeding in the phlegm.  Objective: Vitals:   02/13/21 2100 02/14/21 0003 02/14/21 0508 02/14/21 0837  BP: 102/61 95/66 111/60   Pulse: 88 92 83   Resp: 18 18    Temp: (!) 97.5 F (36.4 C) 98 F (36.7 C) 97.7 F (36.5 C)   TempSrc: Axillary Oral Oral   SpO2: 92% 91% 96% 96%  Weight:   63.6 kg   Height:        Intake/Output Summary (Last 24 hours) at 02/14/2021 1305 Last data filed at 02/14/2021 0458 Gross per 24 hour  Intake 540 ml  Output --  Net 540 ml   Filed Weights   02/12/21 1207 02/13/21 1314 02/14/21 0508  Weight: 64 kg 65 kg 63.6 kg    Examination:  General exam: Appears calm and comfortable, not in any acute distress Respiratory system:  Clear to auscultation. Respiratory effort normal. Cardiovascular system: S1 & S2 heard, RRR. No JVD, murmurs, rubs, gallops or clicks. No pedal edema. Gastrointestinal system: Abdomen is nondistended, soft and nontender. No organomegaly or masses felt. Normal bowel sounds heard. Central nervous system: Alert and oriented. No focal neurological deficits. Extremities: Symmetric 5 x 5 power.  No edema, no cyanosis, no clubbing. Skin: No rashes, lesions or ulcers Psychiatry: Judgement and insight appear normal. Mood & affect appropriate.     Data Reviewed: I have personally reviewed following labs and imaging studies  CBC: Recent Labs  Lab 02/09/21 0748 02/12/21 1314 02/13/21 0421 02/14/21 0334  WBC 2.0* 2.0* 6.8 17.9*  NEUTROABS 1.1* 0.9* 5.4 15.9*  HGB 12.5 12.3 11.0* 11.3*   HCT 36.6 36.6 33.8* 35.2*  MCV 85.5 86.9 88.0 89.3  PLT 138* 188 198 557   Basic Metabolic Panel: Recent Labs  Lab 02/09/21 0748 02/12/21 1314 02/12/21 1507 02/13/21 0421 02/14/21 0334  NA 136 136 134* 138 141  K 4.0 3.9 3.8 3.6 4.0  CL 99 104 102 109 106  CO2 24 24 23 23 26   GLUCOSE 96 102* 93 85 90  BUN 20 21 19 13 15   CREATININE 0.77 0.74 0.65 0.70 0.81  CALCIUM 8.5* 8.1* 8.0* 7.6* 8.1*  MG  --   --   --  2.1 2.3  PHOS  --   --   --  3.8 3.9   GFR: Estimated Creatinine Clearance: 56.6 mL/min (by C-G formula based on SCr of 0.81 mg/dL). Liver Function Tests: Recent Labs  Lab 02/09/21 0748 02/12/21 1507 02/13/21 0421 02/14/21 0334  AST 32 26 22 22   ALT 21 20 17 15   ALKPHOS 64 55 53 63  BILITOT 0.4 0.3 0.6 0.5  PROT 7.1 6.7 5.8* 6.2*  ALBUMIN 3.4* 3.3* 2.9* 3.1*   No results for input(s): LIPASE, AMYLASE in the last 168 hours. No results for input(s): AMMONIA in the last 168 hours. Coagulation Profile: Recent Labs  Lab 02/12/21 1314  INR 1.0   Cardiac Enzymes: No results for input(s): CKTOTAL, CKMB, CKMBINDEX, TROPONINI in the last 168 hours. BNP (last 3 results) No results for input(s): PROBNP in the last 8760 hours. HbA1C: No results for input(s): HGBA1C in the last 72 hours. CBG: Recent Labs  Lab 02/13/21 2111 02/14/21 0001 02/14/21 0506 02/14/21 0751 02/14/21 1104  GLUCAP 105* 123* 82 93 115*   Lipid Profile: No results for input(s): CHOL, HDL, LDLCALC, TRIG, CHOLHDL, LDLDIRECT in the last 72 hours. Thyroid Function Tests: No results for input(s): TSH, T4TOTAL, FREET4, T3FREE, THYROIDAB in the last 72 hours. Anemia Panel: Recent Labs    02/13/21 0422 02/14/21 0334  FERRITIN 554* 507*   Sepsis Labs: Recent Labs  Lab 02/12/21 1507 02/12/21 1900  PROCALCITON  --  <0.10  LATICACIDVEN 0.6  --     Recent Results (from the past 240 hour(s))  Resp Panel by RT-PCR (Flu A&B, Covid) Nasopharyngeal Swab     Status: Abnormal   Collection  Time: 02/12/21  3:07 PM   Specimen: Nasopharyngeal Swab; Nasopharyngeal(NP) swabs in vial transport medium  Result Value Ref Range Status   SARS Coronavirus 2 by RT PCR POSITIVE (A) NEGATIVE Final    Comment: RESULT CALLED TO, READ BACK BY AND VERIFIED WITH:  S. WEST AT 1623 ON 02/12/2021 BY MOSLEY,J (NOTE) SARS-CoV-2 target nucleic acids are DETECTED.  The SARS-CoV-2 RNA is generally detectable in upper respiratory specimens during the acute phase of infection. Positive results are  indicative of the presence of the identified virus, but do not rule out bacterial infection or co-infection with other pathogens not detected by the test. Clinical correlation with patient history and other diagnostic information is necessary to determine patient infection status. The expected result is Negative.  Fact Sheet for Patients: EntrepreneurPulse.com.au  Fact Sheet for Healthcare Providers: IncredibleEmployment.be  This test is not yet approved or cleared by the [place] FDA and  has been authorized for detection and/or diagnosis of SARS-CoV-2 by FDA under an Emergency Use Authorization (EUA).  This EUA will remain in effect (meaning this tes t can be used) for the duration of  the COVID-19 declaration under Section 564(b)(1) of the Act, 21 U.S.C. section 360bbb-3(b)(1), unless the authorization is terminated or revoked sooner.     Influenza A by PCR NEGATIVE NEGATIVE Final   Influenza B by PCR NEGATIVE NEGATIVE Final    Comment: (NOTE) The Xpert Xpress SARS-CoV-2/FLU/RSV plus assay is intended as an aid in the diagnosis of influenza from Nasopharyngeal swab specimens and should not be used as a sole basis for treatment. Nasal washings and aspirates are unacceptable for Xpert Xpress SARS-CoV-2/FLU/RSV testing.  Fact Sheet for Patients: EntrepreneurPulse.com.au  Fact Sheet for Healthcare  Providers: IncredibleEmployment.be  This test is not yet approved or cleared by the [place] FDA and has been authorized for detection and/or diagnosis of SARS-CoV-2 by FDA under an Emergency Use Authorization (EUA). This EUA will remain in effect (meaning this test can be used) for the duration of the COVID-19 declaration under Section 564(b)(1) of the Act, 21 U.S.C. section 360bbb-3(b)(1), unless the authorization is terminated or revoked.  Performed at [facility], [place] [address], [place], [place] [address]   Blood Culture (routine x 2)     Status: None (Preliminary result)   Collection Time: 02/12/21  3:07 PM   Specimen: BLOOD  Result Value Ref Range Status   Specimen Description   Final    BLOOD PORTA CATH Performed at [place] [address] [place], [place] [address]    Special Requests   Final    BOTTLES DRAWN AEROBIC AND ANAEROBIC Blood Culture adequate volume Performed at [place] [address] [place], [place] [address]    Culture   Final    NO GROWTH 2 DAYS Performed at [place] [address] [place], [place] [address]    Report Status PENDING  Incomplete  Culture, Respiratory w Gram Stain     Status: None (Preliminary result)   Collection Time: 02/12/21  5:00 PM   Specimen: Tracheal Aspirate; Respiratory  Result Value Ref Range Status   Specimen Description   Final    TRACHEAL ASPIRATE Performed at [place] [address] [place], [place] [address]    Special Requests   Final    NONE Performed at [facility], [place] [address] [place], [place] [address]    Gram Stain   Final    FEW WBC PRESENT, PREDOMINANTLY PMN RARE GRAM POSITIVE COCCI IN CLUSTERS RARE GRAM NEGATIVE RODS    Culture   Final    FEW STAPHYLOCOCCUS AUREUS CULTURE REINCUBATED FOR BETTER GROWTH SUSCEPTIBILITIES TO FOLLOW Performed at [facility], [place] [address], [place], [place] [address]    Report Status PENDING  Incomplete  MRSA PCR Screening     Status: None   Collection Time: 02/12/21  6:33 PM   Specimen: Nasal Mucosa; Nasopharyngeal  Result Value Ref Range Status   MRSA by PCR NEGATIVE NEGATIVE Final    Comment:        The GeneXpert MRSA Assay (FDA approved for NASAL specimens only), is one component of a comprehensive MRSA colonization surveillance program. It is not intended to diagnose MRSA infection nor to guide or monitor treatment for MRSA infections. Performed at [facility], [place] [address] [place], [place] [address]   Urine culture     Status: Abnormal   Collection Time: 02/12/21 10:37 PM   Specimen: In/Out Cath Urine  Result Value Ref Range Status   Specimen Description   Final    IN/OUT CATH URINE Performed at [place] [address] [place], [place] [address]    Special Requests   Final    NONE Performed at [facility], [place] [address] [place], [place] [address]    Culture MULTIPLE SPECIES PRESENT, SUGGEST RECOLLECTION (A)  Final   Report Status 02/14/2021 FINAL  Final  Blood Culture (routine x 2)     Status: None (Preliminary result)   Collection Time: 02/13/21  4:19 AM   Specimen: BLOOD  Result Value Ref Range Status   Specimen Description   Final    BLOOD LEFT ANTECUBITAL Performed at [place] [address] [place], [place] [address]    Special Requests   Final    BOTTLES DRAWN AEROBIC AND ANAEROBIC Blood Culture results may not be optimal due to an excessive volume of blood received in culture bottles Performed at [place] [address] [place], [place] [address]    Culture   Final    NO GROWTH 1 DAY Performed at [facility], [place] [address] [place], [place] [address]    Report Status PENDING  Incomplete   Radiology Studies: DG Chest Port 1  View  Result Date: 02/12/2021 CLINICAL DATA:  Hemoptysis EXAM: PORTABLE CHEST 1 VIEW COMPARISON:  09/07/2020 CT FINDINGS: Cardiac shadow is within normal limits. Right-sided chest wall port is noted in satisfactory position. Postsurgical changes are noted in the neck consistent with the given clinical history of laryngeal carcinoma. Postsurgical changes in the left axilla are noted as well. Lungs are clear bilaterally. No focal infiltrate or effusion is noted. No bony abnormality is seen. Tracheostomy tube is noted in place. IMPRESSION: No acute abnormality noted. Electronically Signed   By: Inez Catalina M.D.   On: 02/12/2021 14:43   Scheduled Meds: . vitamin C  500 mg Oral Daily  . Chlorhexidine Gluconate Cloth  6 each Topical Daily  . feeding supplement ([employer] STANDARD 1.4)  325 mL Per Tube 5 X Daily  . free water  100 mL Per Tube 5 X Daily  . levothyroxine  137 mcg Oral QAC breakfast  . mouth rinse  15 mL Mouth Rinse BID  . zinc sulfate  220 mg Oral Daily   Continuous Infusions: . ceFEPime (MAXIPIME) IV 2 g (02/14/21 1022)  . remdesivir 100 mg in NS 100 mL 100 mg (02/14/21 1118)     LOS: 1 day    Time spent: 35 mins    Sasuke Yaffe, MD [facility]   If 7PM-7AM, please contact night-coverage

## 2021-02-15 ENCOUNTER — Other Ambulatory Visit (HOSPITAL_COMMUNITY): Payer: Self-pay

## 2021-02-15 DIAGNOSIS — R042 Hemoptysis: Secondary | ICD-10-CM | POA: Diagnosis not present

## 2021-02-15 LAB — CBC WITH DIFFERENTIAL/PLATELET
Abs Immature Granulocytes: 0.29 10*3/uL — ABNORMAL HIGH (ref 0.00–0.07)
Basophils Absolute: 0 10*3/uL (ref 0.0–0.1)
Basophils Relative: 0 %
Eosinophils Absolute: 0.3 10*3/uL (ref 0.0–0.5)
Eosinophils Relative: 3 %
HCT: 34.3 % — ABNORMAL LOW (ref 36.0–46.0)
Hemoglobin: 11.2 g/dL — ABNORMAL LOW (ref 12.0–15.0)
Immature Granulocytes: 2 %
Lymphocytes Relative: 9 %
Lymphs Abs: 1.1 10*3/uL (ref 0.7–4.0)
MCH: 29 pg (ref 26.0–34.0)
MCHC: 32.7 g/dL (ref 30.0–36.0)
MCV: 88.9 fL (ref 80.0–100.0)
Monocytes Absolute: 0.7 10*3/uL (ref 0.1–1.0)
Monocytes Relative: 6 %
Neutro Abs: 10 10*3/uL — ABNORMAL HIGH (ref 1.7–7.7)
Neutrophils Relative %: 80 %
Platelets: 216 10*3/uL (ref 150–400)
RBC: 3.86 MIL/uL — ABNORMAL LOW (ref 3.87–5.11)
RDW: 13.4 % (ref 11.5–15.5)
WBC: 12.4 10*3/uL — ABNORMAL HIGH (ref 4.0–10.5)
nRBC: 0 % (ref 0.0–0.2)

## 2021-02-15 LAB — CULTURE, RESPIRATORY W GRAM STAIN

## 2021-02-15 LAB — C-REACTIVE PROTEIN: CRP: 0.8 mg/dL (ref <1.0)

## 2021-02-15 LAB — COMPREHENSIVE METABOLIC PANEL
ALT: 13 U/L (ref 0–44)
AST: 18 U/L (ref 15–41)
Albumin: 3.2 g/dL — ABNORMAL LOW (ref 3.5–5.0)
Alkaline Phosphatase: 70 U/L (ref 38–126)
Anion gap: 10 (ref 5–15)
BUN: 12 mg/dL (ref 8–23)
CO2: 24 mmol/L (ref 22–32)
Calcium: 8.1 mg/dL — ABNORMAL LOW (ref 8.9–10.3)
Chloride: 106 mmol/L (ref 98–111)
Creatinine, Ser: 0.62 mg/dL (ref 0.44–1.00)
GFR, Estimated: >60 mL/min (ref >60)
Glucose, Bld: 95 mg/dL (ref 70–99)
Potassium: 3.8 mmol/L (ref 3.5–5.1)
Sodium: 140 mmol/L (ref 135–145)
Total Bilirubin: 0.4 mg/dL (ref 0.3–1.2)
Total Protein: 6.1 g/dL — ABNORMAL LOW (ref 6.5–8.1)

## 2021-02-15 LAB — GLUCOSE, CAPILLARY
Glucose-Capillary: 89 mg/dL (ref 70–99)
Glucose-Capillary: 78 mg/dL (ref 70–99)

## 2021-02-15 LAB — FERRITIN: Ferritin: 540 ng/mL — ABNORMAL HIGH (ref 11–307)

## 2021-02-15 LAB — D-DIMER, QUANTITATIVE: D-Dimer, Quant: 0.58 ug/mL-FEU — ABNORMAL HIGH (ref 0.00–0.50)

## 2021-02-15 LAB — PHOSPHORUS: Phosphorus: 3.8 mg/dL (ref 2.5–4.6)

## 2021-02-15 LAB — MAGNESIUM: Magnesium: 2.5 mg/dL — ABNORMAL HIGH (ref 1.7–2.4)

## 2021-02-15 MED ORDER — HEPARIN SOD (PORK) LOCK FLUSH 100 UNIT/ML IV SOLN
500.0000 [IU] | INTRAVENOUS | Status: AC | PRN
  Administered 2021-02-15: 500 [IU]
  Filled 2021-02-15: qty 5

## 2021-02-15 MED ORDER — OXYMETAZOLINE HCL 0.05 % NA SOLN
3.0000 | Freq: Two times a day (BID) | NASAL | 0 refills | Status: DC | PRN
  Filled 2021-02-15: qty 30, 50d supply, fill #0

## 2021-02-15 MED ORDER — SODIUM CHLORIDE 0.9% FLUSH: 10.0000 mL | Freq: Two times a day (BID) | INTRAVENOUS | Status: DC

## 2021-02-15 MED ORDER — SODIUM CHLORIDE 0.9% FLUSH: 10.0000 mL | INTRAVENOUS | Status: DC | PRN

## 2021-02-15 NOTE — Discharge Instructions (Signed)
Advised to follow-up with primary care physician in 1 week. Advised to follow-up with head and neck surgeon in [place] is a scheduled. Advised to take Afrin as needed for intermittent hemiparesis. She was found to have COVID-positive and has completed remdesivir for 3 days.

## 2021-02-15 NOTE — Discharge Summary (Addendum)
Physician Discharge Summary  [name] [name] WCH:[id number] DOB: [birth year] DOA: 02/12/2021  PCP: Leonides Sake, MD  Admit date: 02/12/2021   Discharge date: 02/15/2021  Admitted From: Home.  Disposition:  Home.  Recommendations for Outpatient Follow-up:  1. Follow up with PCP in 1-2 weeks. 2. Please obtain BMP/CBC in one week 3. Advised to follow-up with head and neck surgeon in [place] as scheduled. 4. Advised to take Afrin as needed for intermittent hemoptysis. 5. Please obtain CT chest and CT neck soft tissue as an outpatient.  Home Health: None. Equipment/Devices: None  Discharge Condition: Stable. CODE STATUS:Full code Diet recommendation: Heart Healthy   Brief [facility] Course: This 70 y.o.femalewith PMH significant ofextensive laryngeal cancer(squamous cell carcinoma)diagnosed in 2020 status post radical neck surgery with laryngectomy, currently on immunotherapy with Keytruda every 3 weeks and follows with oncology presents to the emergency department with complaints of 3-day history of hemoptysis. Patient reported passage of blood clots in her sputum and it happened 3-4 times a day. She was also having cough. She was seen by her oncologist, Dr. Paris Callas an outpatient, suspected to have respiratory infection that could have contributed to the hemoptysis and was started on amoxicillin on 5/20 but it did not help. She is status post trach and PEG. She was started on broad-spectrum antibiotics,to cover for neutropenia and possible respiratory tract infection associated hemoptysis.  Patient was admitted for hemoptysis.  Pulmonology was consulted for hemoptysis, bedside bronchoscopy was done which did not reveal any obvious pulmonary etiology for hemoptysis. Chest x-ray did not show pneumonia.ENT was consulted,  patient had tracheoscopy which demonstrated pinpoint area of bleeding at the laryngectomy stoma which was stopped with silver nitrate with good  hemostasis.  Hemoglobin remained stable.[place] KitchenCOVID screen test positive.Started on remdesivir forincidental positive COVID.  Patient has completed 3 days of remdesivir treatment. Patient has received cefepime for 4 days.  Blood cultures were negative. Patient being discharged after last dose of antibiotics.  Patient will follow up with head and neck surgeon in [place].  Patient ambulated well without any shortness of breath or hypoxia.  She was managed for below problems.  Discharge Diagnoses:  Principal Problem:   Hemoptysis Active Problems:   Laryngeal squamous cell carcinoma (HCC)   Port-A-Cath in place   Protein malnutrition ([name])   Hypothyroidism   Cancer associated pain   Pressure injury of skin  Hemoptysis: > Improved. She was seen as an outpatient by her PCP and was started on amoxicillin,due to suspicion for respiratory tract infection associated hemoptysis. Itdid not improve and she presented to the emergency department. PCCM was consulted,underwent bedside bronchoscopy with finding of trace endobronchial secretions, no interventional lesions. Found to have irritated airway with trace dried blood but no obvious source of patient's hemoptysis found. Unclearetiology for hemoptysis. Chest x-ray does not show any pneumonia. ENT consulted, patient had tracheoscopy which demonstrated pinpoint area of bleeding at the laryngectomy stoma.  Bleeding was stopped with silver nitrate. If bleeding recurs recommended racemic epinephrine breathing treatment. Her hemoglobin has remained stable and she does not have significant hemoptysis after this hospitalization but she has seensometinge of blood in the tissue.  COVIDinfection: COVID screening test positive. Patient is fully vaccinated,she does not know how she was exposed. She does not have any signs of pneumonia. She is not hypoxic and is maintaining her saturation on room air. Due to her high risks of decompensation due to  her history of malignancy and immunocompromise status,startedon remdesivir 3 days course. No indication for starting  any steroid therapy.  Suspicion for sepsis/hypotension/neutropenia: Initially hypotensive on presentation. Blood pressure improved with IV fluids. She was given a dose of broad-spectrum antibiotics in the emergency department.negativeprocalcitonin.  Blood cultures no growth so far. Cortisol level is normal .She is afebrile. Patientwasalso neutropenic on presentation with leukocyte counts of 2000. Absolute neutrophil count less than 1000. Neutropenia could be secondary to chemotherapy or covid.Continue cefepimefor now. Hopefully the antibiotics will be discontinued.  Status post adose of [place] 02/12/21.  Laryngeal cancer: Initially diagnosed in2020 with extensive laryngeal squamous cell carcinoma. Status post radical neck surgery with laryngectomy. Status post tracheostomy.She doesn'tneed oxygen at baseline.Currently on immunotherapy with Keytruda every 3 weeks. Follows with oncology  History of hypothyroidism:Continue Synthyroid  History of dysphagia/protein calorie malnutrition: Status postPEG.Nutrition consulted for starting on PEG feeding  Chronic pain syndrome: Due to cancer. Continue pain management, supportive care  Discharge Instructions  Discharge Instructions    Call MD for:  difficulty breathing, headache or visual disturbances   Complete by: As directed    Call MD for:  persistant dizziness or light-headedness   Complete by: As directed    Call MD for:  persistant nausea and vomiting   Complete by: As directed    Diet - low sodium heart healthy   Complete by: As directed    Diet clear liquid   Complete by: As directed    Discharge instructions   Complete by: As directed    Advised to follow-up with primary care physician in 1 week. Advised to follow-up with head and neck surgeon in [place] is a scheduled. Advised  to take Afrin as needed for intermittent hemiparesis.   Discharge wound care:   Complete by: As directed    Follow-up PCP.   Increase activity slowly   Complete by: As directed      Allergies as of 02/15/2021   No Known Allergies     Medication List    STOP taking these medications   amoxicillin 500 MG capsule Commonly known as: AMOXIL   lidocaine-prilocaine cream Commonly known as: EMLA   sodium fluoride 1.1 % Crea dental cream Commonly known as: PreviDent 5000 Plus     TAKE these medications   chlorhexidine 0.12 % solution Commonly known as: PERIDEX Use as directed 15 mLs in the mouth or throat 2 (two) times daily. Rinse with 15 mls twice daily for 30 seconds. Use after breakfast and at bedtime. Spit out excess. Do not swallow.   HYDROcodone-acetaminophen 5-325 MG tablet Commonly known as: NORCO/VICODIN TAKE 1 TABLET BY MOUTH EVERY 6 HOURS AS NEEDED FOR MODERATE PAIN   [employer] Peptide 1.5 Liqd Take 4 Bottles by mouth daily.   levothyroxine 137 MCG tablet Commonly known as: SYNTHROID Take 137 mcg by mouth daily before breakfast.   nystatin 100000 UNIT/ML suspension Commonly known as: MYCOSTATIN Take 5 mLs by mouth 2 (two) times daily.   ondansetron 8 MG tablet Commonly known as: Zofran Take 1 tablet (8 mg total) by mouth 2 (two) times daily as needed (Nausea or vomiting). What changed: reasons to take this   oxymetazoline 0.05 % nasal spray Commonly known as: AFRIN Place 3 sprays into both nostrils 2 (two) times daily as needed (Bleeding from stoma).   prochlorperazine 10 MG tablet Commonly known as: COMPAZINE Take 1 tablet (10 mg total) by mouth every 6 (six) hours as needed (Nausea or vomiting). What changed: reasons to take this   Vitamin D3 125 MCG (5000 UT) Caps Take 5,000 Units by mouth daily.  Discharge Care Instructions  (From admission, onward)         Start     Ordered   02/15/21 0000  Discharge wound care:        Comments: Follow-up PCP.   02/15/21 1105          Follow-up Information    Hamrick, Maura L, MD Follow up in 1 week(s).   Specialty: Family Medicine Contact information: [place] [address] [phone]              No Known Allergies  Consultations:  Pulmonology  ENT   Procedures/Studies: DG Chest Port 1 View  Result Date: 02/12/2021 CLINICAL DATA:  Hemoptysis EXAM: PORTABLE CHEST 1 VIEW COMPARISON:  09/07/2020 CT FINDINGS: Cardiac shadow is within normal limits. Right-sided chest wall port is noted in satisfactory position. Postsurgical changes are noted in the neck consistent with the given clinical history of laryngeal carcinoma. Postsurgical changes in the left axilla are noted as well. Lungs are clear bilaterally. No focal infiltrate or effusion is noted. No bony abnormality is seen. Tracheostomy tube is noted in place. IMPRESSION: No acute abnormality noted. Electronically Signed   By: Inez Catalina M.D.   On: 02/12/2021 14:43      Subjective: Patient was seen and examined at bedside.  Overnight events noted.  Patient reports feeling much improved.   Denies any further bleeding in the in the phlegm,  feels better and wants to be discharged.  Discharge Exam: Vitals:   02/15/21 0429 02/15/21 0812  BP: 112/70   Pulse: 75 62  Resp: 19 15  Temp: (!) 97.4 F (36.3 C)   SpO2: 97% 97%   Vitals:   02/14/21 1957 02/14/21 1959 02/15/21 0429 02/15/21 0812  BP: 110/63  112/70   Pulse: 79 81 75 62  Resp:   19 15  Temp: (!) 97.5 F (36.4 C)  (!) 97.4 F (36.3 C)   TempSrc: Oral  Axillary   SpO2: 93% 96% 97% 97%  Weight:   63.7 kg   Height:        General: Pt is alert, awake, not in acute distress Cardiovascular: RRR, S1/S2 +, no rubs, no gallops Respiratory: CTA bilaterally, no wheezing, no rhonchi Abdominal: Soft, NT, ND, bowel sounds + Extremities: no edema, no cyanosis    The results of significant diagnostics from this  hospitalization (including imaging, microbiology, ancillary and laboratory) are listed below for reference.     Microbiology: Recent Results (from the past 240 hour(s))  Resp Panel by RT-PCR (Flu A&B, Covid) Nasopharyngeal Swab     Status: Abnormal   Collection Time: 02/12/21  3:07 PM   Specimen: Nasopharyngeal Swab; Nasopharyngeal(NP) swabs in vial transport medium  Result Value Ref Range Status   SARS Coronavirus 2 by RT PCR POSITIVE (A) NEGATIVE Final    Comment: RESULT CALLED TO, READ BACK BY AND VERIFIED WITH:  S. WEST AT 1623 ON 02/12/2021 BY MOSLEY,J (NOTE) SARS-CoV-2 target nucleic acids are DETECTED.  The SARS-CoV-2 RNA is generally detectable in upper respiratory specimens during the acute phase of infection. Positive results are indicative of the presence of the identified virus, but do not rule out bacterial infection or co-infection with other pathogens not detected by the test. Clinical correlation with patient history and other diagnostic information is necessary to determine patient infection status. The expected result is Negative.  Fact Sheet for Patients: EntrepreneurPulse.com.au  Fact Sheet for Healthcare Providers: IncredibleEmployment.be  This test is not yet approved or  cleared by the [place] and  has been authorized for detection and/or diagnosis of SARS-CoV-2 by FDA under an Emergency Use Authorization (EUA).  This EUA will remain in effect (meaning this tes t can be used) for the duration of  the COVID-19 declaration under Section 564(b)(1) of the Act, 21 U.S.C. section 360bbb-3(b)(1), unless the authorization is terminated or revoked sooner.     Influenza A by PCR NEGATIVE NEGATIVE Final   Influenza B by PCR NEGATIVE NEGATIVE Final    Comment: (NOTE) The Xpert Xpress SARS-CoV-2/FLU/RSV plus assay is intended as an aid in the diagnosis of influenza from Nasopharyngeal swab specimens and should not be used  as a sole basis for treatment. Nasal washings and aspirates are unacceptable for Xpert Xpress SARS-CoV-2/FLU/RSV testing.  Fact Sheet for Patients: EntrepreneurPulse.com.au  Fact Sheet for Healthcare Providers: IncredibleEmployment.be  This test is not yet approved or cleared by the [place] FDA and has been authorized for detection and/or diagnosis of SARS-CoV-2 by FDA under an Emergency Use Authorization (EUA). This EUA will remain in effect (meaning this test can be used) for the duration of the COVID-19 declaration under Section 564(b)(1) of the Act, 21 U.S.C. section 360bbb-3(b)(1), unless the authorization is terminated or revoked.  Performed at [facility], [place] [address] [place], [place] [address]   Blood Culture (routine x 2)     Status: None (Preliminary result)   Collection Time: 02/12/21  3:07 PM   Specimen: BLOOD  Result Value Ref Range Status   Specimen Description   Final    BLOOD PORTA CATH Performed at [facility] [address], [place], [place] [address]    Special Requests   Final    BOTTLES DRAWN AEROBIC AND ANAEROBIC Blood Culture adequate volume Performed at [facility] [address] [place], [place] [address]    Culture   Final    NO GROWTH 3 DAYS Performed at [facility], [place] [address] [place], [place] [address]    Report Status PENDING  Incomplete  Culture, Respiratory w Gram Stain     Status: None   Collection Time: 02/12/21  5:00 PM   Specimen: Tracheal Aspirate; Respiratory  Result Value Ref Range Status   Specimen Description   Final    TRACHEAL ASPIRATE Performed at [facility] [address] [place], [place] [address]    Special Requests   Final    NONE Performed at [facility], [place] [address] [place], [place] [address]    Gram Stain   Final    FEW WBC PRESENT, PREDOMINANTLY  PMN RARE GRAM POSITIVE COCCI IN CLUSTERS RARE GRAM NEGATIVE RODS Performed at [facility], [place] [address], [place],  [address]    Culture FEW STAPHYLOCOCCUS AUREUS  Final   Report Status 02/15/2021 FINAL  Final   Organism ID, Bacteria STAPHYLOCOCCUS AUREUS  Final      Susceptibility   Staphylococcus aureus - MIC*    CIPROFLOXACIN 1 SENSITIVE Sensitive     ERYTHROMYCIN <=0.25 SENSITIVE Sensitive     GENTAMICIN <=0.5 SENSITIVE Sensitive     OXACILLIN 0.5 SENSITIVE Sensitive     TETRACYCLINE <=1 SENSITIVE Sensitive     VANCOMYCIN <=0.5 SENSITIVE Sensitive     TRIMETH/SULFA <=10 SENSITIVE Sensitive     CLINDAMYCIN <=0.25 SENSITIVE Sensitive     RIFAMPIN <=0.5 SENSITIVE Sensitive     Inducible Clindamycin NEGATIVE Sensitive     * FEW  STAPHYLOCOCCUS AUREUS  MRSA PCR Screening     Status: None   Collection Time: 02/12/21  6:33 PM   Specimen: Nasal Mucosa; Nasopharyngeal  Result Value Ref Range Status   MRSA by PCR NEGATIVE NEGATIVE Final    Comment:        The GeneXpert MRSA Assay (FDA approved for NASAL specimens only), is one component of a comprehensive MRSA colonization surveillance program. It is not intended to diagnose MRSA infection nor to guide or monitor treatment for MRSA infections. Performed at [facility], [place] [address], [place], [place] [address]   Urine culture     Status: Abnormal   Collection Time: 02/12/21 10:37 PM   Specimen: In/Out Cath Urine  Result Value Ref Range Status   Specimen Description   Final    IN/OUT CATH URINE Performed at [facility] [address], [place], [place] [address]    Special Requests   Final    NONE Performed at [facility], [place] [address], [place], [place] [address]    Culture MULTIPLE SPECIES PRESENT, SUGGEST RECOLLECTION (A)  Final   Report Status 02/14/2021 FINAL  Final  Blood Culture (routine x 2)     Status: None (Preliminary result)    Collection Time: 02/13/21  4:19 AM   Specimen: BLOOD  Result Value Ref Range Status   Specimen Description   Final    BLOOD LEFT ANTECUBITAL Performed at [facility] [address] [place], [place] [address]    Special Requests   Final    BOTTLES DRAWN AEROBIC AND ANAEROBIC Blood Culture results may not be optimal due to an excessive volume of blood received in culture bottles Performed at [facility] [address], [place], [place] [address]    Culture   Final    NO GROWTH 2 DAYS Performed at [facility] [address] [place], [place] [address]    Report Status PENDING  Incomplete     Labs: BNP (last 3 results) No results for input(s): BNP in the last 8760 hours. Basic Metabolic Panel: Recent Labs  Lab 02/12/21 1314 02/12/21 1507 02/13/21 0421 02/14/21 0334 02/15/21 0504  NA 136 134* 138 141 140  K 3.9 3.8 3.6 4.0 3.8  CL 104 102 109 106 106  CO2 24 23 23 26 24   GLUCOSE 102* 93 85 90 95  BUN 21 19 13 15 12   CREATININE 0.74 0.65 0.70 0.81 0.62  CALCIUM 8.1* 8.0* 7.6* 8.1* 8.1*  MG  --   --  2.1 2.3 2.5*  PHOS  --   --  3.8 3.9 3.8   Liver Function Tests: Recent Labs  Lab 02/09/21 0748 02/12/21 1507 02/13/21 0421 02/14/21 0334 02/15/21 0504  AST 32 26 22 22 18   ALT 21 20 17 15 13   ALKPHOS 64 55 53 63 70  BILITOT 0.4 0.3 0.6 0.5 0.4  PROT 7.1 6.7 5.8* 6.2* 6.1*  ALBUMIN 3.4* 3.3* 2.9* 3.1* 3.2*   No results for input(s): LIPASE, AMYLASE in the last 168 hours. No results for input(s): AMMONIA in the last 168 hours. CBC: Recent Labs  Lab 02/09/21 0748 02/12/21 1314 02/13/21 0421 02/14/21 0334 02/15/21 0504  WBC 2.0* 2.0* 6.8 17.9* 12.4*  NEUTROABS 1.1* 0.9* 5.4 15.9* 10.0*  HGB 12.5 12.3 11.0* 11.3* 11.2*  HCT 36.6 36.6 33.8* 35.2* 34.3*  MCV 85.5 86.9 88.0 89.3 88.9  PLT 138* 188 198 200 216   Cardiac  Enzymes: No results for input(s): CKTOTAL, CKMB, CKMBINDEX, TROPONINI in the last 168  hours. BNP: Invalid input(s): POCBNP CBG: Recent Labs  Lab 02/14/21 1713 02/14/21 1955 02/14/21 2354 02/15/21 0426 02/15/21 0728  GLUCAP 101* 128* 107* 89 78   D-Dimer Recent Labs    02/14/21 0334 02/15/21 0504  DDIMER 0.56* 0.58*   Hgb A1c No results for input(s): HGBA1C in the last 72 hours. Lipid Profile No results for input(s): CHOL, HDL, LDLCALC, TRIG, CHOLHDL, LDLDIRECT in the last 72 hours. Thyroid function studies No results for input(s): TSH, T4TOTAL, T3FREE, THYROIDAB in the last 72 hours.  Invalid input(s): FREET3 Anemia work up Recent Labs    02/14/21 0334 02/15/21 0504  FERRITIN 507* 540*   Urinalysis    Component Value Date/Time   COLORURINE COLORLESS (A) 02/12/2021 2237   APPEARANCEUR CLEAR 02/12/2021 2237   LABSPEC 1.006 02/12/2021 2237   PHURINE 8.0 02/12/2021 2237   GLUCOSEU NEGATIVE 02/12/2021 2237   HGBUR NEGATIVE 02/12/2021 2237   BILIRUBINUR NEGATIVE 02/12/2021 2237   KETONESUR 5 (A) 02/12/2021 2237   PROTEINUR NEGATIVE 02/12/2021 2237   NITRITE NEGATIVE 02/12/2021 2237   LEUKOCYTESUR NEGATIVE 02/12/2021 2237   Sepsis Labs Invalid input(s): PROCALCITONIN,  WBC,  LACTICIDVEN Microbiology Recent Results (from the past 240 hour(s))  Resp Panel by RT-PCR (Flu A&B, Covid) Nasopharyngeal Swab     Status: Abnormal   Collection Time: 02/12/21  3:07 PM   Specimen: Nasopharyngeal Swab; Nasopharyngeal(NP) swabs in vial transport medium  Result Value Ref Range Status   SARS Coronavirus 2 by RT PCR POSITIVE (A) NEGATIVE Final    Comment: RESULT CALLED TO, READ BACK BY AND VERIFIED WITH:  S. WEST AT 1623 ON 02/12/2021 BY MOSLEY,J (NOTE) SARS-CoV-2 target nucleic acids are DETECTED.  The SARS-CoV-2 RNA is generally detectable in upper respiratory specimens during the acute phase of infection. Positive results are indicative of the presence of the identified virus, but do not rule out bacterial infection or co-infection with other pathogens  not detected by the test. Clinical correlation with patient history and other diagnostic information is necessary to determine patient infection status. The expected result is Negative.  Fact Sheet for Patients: EntrepreneurPulse.com.au  Fact Sheet for Healthcare Providers: IncredibleEmployment.be  This test is not yet approved or cleared by the [place] FDA and  has been authorized for detection and/or diagnosis of SARS-CoV-2 by FDA under an Emergency Use Authorization (EUA).  This EUA will remain in effect (meaning this tes t can be used) for the duration of  the COVID-19 declaration under Section 564(b)(1) of the Act, 21 U.S.C. section 360bbb-3(b)(1), unless the authorization is terminated or revoked sooner.     Influenza A by PCR NEGATIVE NEGATIVE Final   Influenza B by PCR NEGATIVE NEGATIVE Final    Comment: (NOTE) The Xpert Xpress SARS-CoV-2/FLU/RSV plus assay is intended as an aid in the diagnosis of influenza from Nasopharyngeal swab specimens and should not be used as a sole basis for treatment. Nasal washings and aspirates are unacceptable for Xpert Xpress SARS-CoV-2/FLU/RSV testing.  Fact Sheet for Patients: EntrepreneurPulse.com.au  Fact Sheet for Healthcare Providers: IncredibleEmployment.be  This test is not yet approved or cleared by the [place] FDA and has been authorized for detection and/or diagnosis of SARS-CoV-2 by FDA under an Emergency Use Authorization (EUA). This EUA will remain in effect (meaning this test can be used) for the duration of the COVID-19 declaration under Section 564(b)(1) of the Act, 21 U.S.C. section 360bbb-3(b)(1), unless the authorization is  terminated or revoked.  Performed at [facility], [place] [address], [place], [place] [address]   Blood Culture (routine x 2)     Status: None (Preliminary result)   Collection Time: 02/12/21   3:07 PM   Specimen: BLOOD  Result Value Ref Range Status   Specimen Description   Final    BLOOD PORTA CATH Performed at [facility] [address] [place], [place] [address]    Special Requests   Final    BOTTLES DRAWN AEROBIC AND ANAEROBIC Blood Culture adequate volume Performed at [facility] [address], [place], [place] [address]    Culture   Final    NO GROWTH 3 DAYS Performed at [facility], [place] [address], [place], [place] [address]    Report Status PENDING  Incomplete  Culture, Respiratory w Gram Stain     Status: None   Collection Time: 02/12/21  5:00 PM   Specimen: Tracheal Aspirate; Respiratory  Result Value Ref Range Status   Specimen Description   Final    TRACHEAL ASPIRATE Performed at [facility] [address], [place], [place] [address]    Special Requests   Final    NONE Performed at [facility], [place] [address] [place], [place] [address]    Gram Stain   Final    FEW WBC PRESENT, PREDOMINANTLY PMN RARE GRAM POSITIVE COCCI IN CLUSTERS RARE GRAM NEGATIVE RODS Performed at [facility], [place] [address] [place], [place] [address]    Culture FEW STAPHYLOCOCCUS AUREUS  Final   Report Status 02/15/2021 FINAL  Final   Organism ID, Bacteria STAPHYLOCOCCUS AUREUS  Final      Susceptibility   Staphylococcus aureus - MIC*    CIPROFLOXACIN 1 SENSITIVE Sensitive     ERYTHROMYCIN <=0.25 SENSITIVE Sensitive     GENTAMICIN <=0.5 SENSITIVE Sensitive     OXACILLIN 0.5 SENSITIVE Sensitive     TETRACYCLINE <=1 SENSITIVE Sensitive     VANCOMYCIN <=0.5 SENSITIVE Sensitive     TRIMETH/SULFA <=10 SENSITIVE Sensitive     CLINDAMYCIN <=0.25 SENSITIVE Sensitive     RIFAMPIN <=0.5 SENSITIVE Sensitive     Inducible Clindamycin NEGATIVE Sensitive     * FEW STAPHYLOCOCCUS AUREUS  MRSA PCR Screening     Status: None   Collection Time: 02/12/21  6:33 PM   Specimen: Nasal Mucosa;  Nasopharyngeal  Result Value Ref Range Status   MRSA by PCR NEGATIVE NEGATIVE Final    Comment:        The GeneXpert MRSA Assay (FDA approved for NASAL specimens only), is one component of a comprehensive MRSA colonization surveillance program. It is not intended to diagnose MRSA infection nor to guide or monitor treatment for MRSA infections. Performed at [facility], [place] [address], [place], [place] [address]   Urine culture     Status: Abnormal   Collection Time: 02/12/21 10:37 PM   Specimen: In/Out Cath Urine  Result Value Ref Range Status   Specimen Description   Final    IN/OUT CATH URINE Performed at [facility] [address] [place], [place] [address]    Special Requests   Final    NONE Performed at [facility], [place] [address] [place], [place] [address]    Culture MULTIPLE SPECIES PRESENT, SUGGEST RECOLLECTION (A)  Final   Report Status 02/14/2021 FINAL  Final  Blood Culture (routine x 2)  Status: None (Preliminary result)   Collection Time: 02/13/21  4:19 AM   Specimen: BLOOD  Result Value Ref Range Status   Specimen Description   Final    BLOOD LEFT ANTECUBITAL Performed at [facility] [address] [place], [place] [address]    Special Requests   Final    BOTTLES DRAWN AEROBIC AND ANAEROBIC Blood Culture results may not be optimal due to an excessive volume of blood received in culture bottles Performed at [facility] [facility] [address], [place], [place] [address]    Culture   Final    NO GROWTH 2 DAYS Performed at [facility] [address], [place], [place] [address]    Report Status PENDING  Incomplete     Time coordinating discharge: Over 30 minutes  SIGNED:   Shawna Clamp, MD  [facility] 02/15/2021, 1:00 PM Pager   If 7PM-7AM, please contact night-coverage [email or web address]

## 2021-02-15 NOTE — Plan of Care (Signed)
  Problem: Health Behavior/Discharge Planning: Goal: Ability to manage health-related needs will improve Outcome: Completed/Met   Problem: Nutrition: Goal: Adequate nutrition will be maintained Outcome: Completed/Met   Problem: Education: Goal: Knowledge of risk factors and measures for prevention of condition will improve Outcome: Completed/Met

## 2021-02-17 LAB — CULTURE, BLOOD (ROUTINE X 2)
Culture: NO GROWTH
Special Requests: ADEQUATE

## 2021-02-18 LAB — CULTURE, BLOOD (ROUTINE X 2): Culture: NO GROWTH

## 2021-02-26 DIAGNOSIS — Z139 Encounter for screening, unspecified: Secondary | ICD-10-CM | POA: Diagnosis not present

## 2021-02-26 DIAGNOSIS — Z0000 Encounter for general adult medical examination without abnormal findings: Secondary | ICD-10-CM | POA: Diagnosis not present

## 2021-02-26 DIAGNOSIS — E785 Hyperlipidemia, unspecified: Secondary | ICD-10-CM | POA: Diagnosis not present

## 2021-02-26 DIAGNOSIS — Z9181 History of falling: Secondary | ICD-10-CM | POA: Diagnosis not present

## 2021-02-26 DIAGNOSIS — Z1331 Encounter for screening for depression: Secondary | ICD-10-CM | POA: Diagnosis not present

## 2021-03-02 ENCOUNTER — Inpatient Hospital Stay: Payer: Medicare HMO

## 2021-03-02 ENCOUNTER — Inpatient Hospital Stay: Payer: Medicare HMO | Attending: Hematology | Admitting: Hematology and Oncology

## 2021-03-02 ENCOUNTER — Encounter: Payer: Self-pay | Admitting: Hematology and Oncology

## 2021-03-02 ENCOUNTER — Other Ambulatory Visit: Payer: Self-pay

## 2021-03-02 ENCOUNTER — Other Ambulatory Visit (HOSPITAL_COMMUNITY): Payer: Self-pay

## 2021-03-02 DIAGNOSIS — Z95828 Presence of other vascular implants and grafts: Secondary | ICD-10-CM

## 2021-03-02 DIAGNOSIS — Z930 Tracheostomy status: Secondary | ICD-10-CM | POA: Insufficient documentation

## 2021-03-02 DIAGNOSIS — C329 Malignant neoplasm of larynx, unspecified: Secondary | ICD-10-CM

## 2021-03-02 DIAGNOSIS — R1310 Dysphagia, unspecified: Secondary | ICD-10-CM | POA: Diagnosis not present

## 2021-03-02 DIAGNOSIS — E039 Hypothyroidism, unspecified: Secondary | ICD-10-CM

## 2021-03-02 DIAGNOSIS — C321 Malignant neoplasm of supraglottis: Secondary | ICD-10-CM | POA: Diagnosis not present

## 2021-03-02 DIAGNOSIS — Z5112 Encounter for antineoplastic immunotherapy: Secondary | ICD-10-CM | POA: Insufficient documentation

## 2021-03-02 DIAGNOSIS — Z79899 Other long term (current) drug therapy: Secondary | ICD-10-CM | POA: Diagnosis not present

## 2021-03-02 DIAGNOSIS — G893 Neoplasm related pain (acute) (chronic): Secondary | ICD-10-CM | POA: Insufficient documentation

## 2021-03-02 DIAGNOSIS — Z87891 Personal history of nicotine dependence: Secondary | ICD-10-CM | POA: Insufficient documentation

## 2021-03-02 DIAGNOSIS — Z923 Personal history of irradiation: Secondary | ICD-10-CM | POA: Insufficient documentation

## 2021-03-02 DIAGNOSIS — E46 Unspecified protein-calorie malnutrition: Secondary | ICD-10-CM | POA: Insufficient documentation

## 2021-03-02 LAB — CBC WITH DIFFERENTIAL/PLATELET
Abs Immature Granulocytes: 0.01 10*3/uL (ref 0.00–0.07)
Basophils Absolute: 0 10*3/uL (ref 0.0–0.1)
Basophils Relative: 1 %
Eosinophils Absolute: 0.2 10*3/uL (ref 0.0–0.5)
Eosinophils Relative: 4 %
HCT: 36.9 % (ref 36.0–46.0)
Hemoglobin: 12.2 g/dL (ref 12.0–15.0)
Immature Granulocytes: 0 %
Lymphocytes Relative: 33 %
Lymphs Abs: 1.2 10*3/uL (ref 0.7–4.0)
MCH: 28.6 pg (ref 26.0–34.0)
MCHC: 33.1 g/dL (ref 30.0–36.0)
MCV: 86.6 fL (ref 80.0–100.0)
Monocytes Absolute: 0.7 10*3/uL (ref 0.1–1.0)
Monocytes Relative: 18 %
Neutro Abs: 1.6 10*3/uL — ABNORMAL LOW (ref 1.7–7.7)
Neutrophils Relative %: 44 %
Platelets: 211 10*3/uL (ref 150–400)
RBC: 4.26 MIL/uL (ref 3.87–5.11)
RDW: 14 % (ref 11.5–15.5)
WBC: 3.6 10*3/uL — ABNORMAL LOW (ref 4.0–10.5)
nRBC: 0 % (ref 0.0–0.2)

## 2021-03-02 LAB — TSH: TSH: 0.572 u[IU]/mL (ref 0.308–3.960)

## 2021-03-02 LAB — COMPREHENSIVE METABOLIC PANEL
ALT: 13 U/L (ref 0–44)
AST: 18 U/L (ref 15–41)
Albumin: 3.8 g/dL (ref 3.5–5.0)
Alkaline Phosphatase: 69 U/L (ref 38–126)
Anion gap: 11 (ref 5–15)
BUN: 25 mg/dL — ABNORMAL HIGH (ref 8–23)
CO2: 26 mmol/L (ref 22–32)
Calcium: 9.1 mg/dL (ref 8.9–10.3)
Chloride: 103 mmol/L (ref 98–111)
Creatinine, Ser: 0.71 mg/dL (ref 0.44–1.00)
GFR, Estimated: >60 mL/min (ref >60)
Glucose, Bld: 98 mg/dL (ref 70–99)
Potassium: 4 mmol/L (ref 3.5–5.1)
Sodium: 140 mmol/L (ref 135–145)
Total Bilirubin: 0.5 mg/dL (ref 0.3–1.2)
Total Protein: 7.4 g/dL (ref 6.5–8.1)

## 2021-03-02 MED ORDER — SODIUM CHLORIDE 0.9 % IV SOLN
Freq: Once | INTRAVENOUS | Status: AC
  Filled 2021-03-02: qty 250

## 2021-03-02 MED ORDER — SODIUM CHLORIDE 0.9% FLUSH
10.0000 mL | INTRAVENOUS | Status: DC | PRN
  Administered 2021-03-02: 10 mL
  Filled 2021-03-02: qty 10

## 2021-03-02 MED ORDER — SODIUM CHLORIDE 0.9 % IV SOLN
200.0000 mg | Freq: Once | INTRAVENOUS | Status: AC
  Administered 2021-03-02: 200 mg via INTRAVENOUS
  Filled 2021-03-02: qty 8

## 2021-03-02 MED ORDER — HYDROCODONE-ACETAMINOPHEN 5-325 MG PO TABS
1.0000 | ORAL_TABLET | Freq: Four times a day (QID) | ORAL | 0 refills | Status: DC | PRN
  Filled 2021-03-02: qty 60, 15d supply, fill #0

## 2021-03-02 MED ORDER — HEPARIN SOD (PORK) LOCK FLUSH 100 UNIT/ML IV SOLN
500.0000 [IU] | Freq: Once | INTRAVENOUS | Status: AC | PRN
  Administered 2021-03-02: 500 [IU]
  Filled 2021-03-02: qty 5

## 2021-03-02 MED ORDER — SODIUM CHLORIDE 0.9% FLUSH
10.0000 mL | Freq: Once | INTRAVENOUS | Status: AC
Start: 2021-03-02 — End: 2021-03-02
  Administered 2021-03-02: 10 mL
  Filled 2021-03-02: qty 10

## 2021-03-02 NOTE — Assessment & Plan Note (Signed)
She is able to reduce the amount of pain medicine required She will continue pain medicine taper as tolerated I have refilled her prescription pain medicine today

## 2021-03-02 NOTE — Progress Notes (Signed)
[name] OFFICE PROGRESS NOTE  Patient Care Team: Hamrick, Lorin Mercy, MD as PCP - General (Family Medicine) Heath Lark, MD as Consulting Physician (Hematology and Oncology)  ASSESSMENT & PLAN:  Laryngeal squamous cell carcinoma [facility]) Her hemoptysis has resolved She is scheduled for CT imaging at [facility] on July 8 with follow-up to see ENT surgeon then I encouraged her to keep those appointment and follow-up In the meantime, we will continue immunotherapy  Cancer associated pain She is able to reduce the amount of pain medicine required She will continue pain medicine taper as tolerated I have refilled her prescription pain medicine today  Dysphagia Her dysphagia is not better She is able to swallow pills but not food For now, she will continue nutritional intake through feeding tube  No orders of the defined types were placed in this encounter.   All questions were answered. The patient knows to call the clinic with any problems, questions or concerns. The total time spent in the appointment was 20 minutes encounter with patients including review of chart and various tests results, discussions about plan of care and coordination of care plan   Heath Lark, MD 03/02/2021 10:24 AM  INTERVAL HISTORY: Please see below for problem oriented charting. She returns with her son She communicates through writing She was recently hospitalized for hemoptysis She received cauterization procedure by ENT surgeon and has stopped bleeding since then She has appointment for follow-up at [facility] next month Her pain is stable She continues to have dysphagia  SUMMARY OF ONCOLOGIC HISTORY: Oncology History  Laryngeal squamous cell carcinoma ([name])  12/02/2018 Imaging   CT neck: IMPRESSION: 1. Supraglottic laryngeal carcinoma extending across the anterior commissure and invading the prelaryngeal fat, left aryepiglottic fold and left aspect of the epiglottis. 2. Severe  narrowing of the laryngeal airway. 3. Bilateral subcentimeter level 2A and 2A cervical lymph nodes.     12/02/2018 Imaging   CT  Chest:  IMPRESSION: No evidence of metastatic disease in the chest.    12/03/2018 Pathology Results   (Care Everywhere; [facility]) A: Larynx, supraglottis, biopsy - Atypical squamous proliferation with at least low grade dysplasia and abundant keratinization - No definite high grade dysplasia or invasive carcinoma represented (see comment)    12/18/2018 Pathology Results   (Care Everywhere; [facility]) A:  Neck, left, fine needle aspiration - Rare single atypical cells, no cells diagnostic of malignancy - Lymph node material present  B:  Neck, right, fine needle aspiration - Non-diagnostic specimen - No lymph node material identified - Mixed inflammation and blood    12/31/2018 Pathology Results   (Care Everywhere; [facility])  A: Larynx, left supraglottic mass, biopsy - Atypical squamous proliferation with abundant keratinization, suspicious for well-differentiated invasive squamous cell carcinoma  B: Larynx, left supraglottic mass, biopsy - Well-differentiated invasive squamous cell carcinoma with abundant keratinization (see comment)    01/18/2019 Initial Diagnosis   Laryngeal cancer ([place])    01/26/2019 Imaging   1. Locally advanced hypermetabolic laryngeal neoplasm centered in the left supraglottic space with bilateral glottic and epiglottic involvement and subglottic extension on the left. 2. Hypermetabolic bilateral level 3 neck nodal metastases. 3. Hypermetabolic high mediastinal nodal metastases between the trachea and upper thoracic esophagus. 4. Otherwise no distant hypermetabolic metastatic disease. 5. Nodular 7 mm focus at the left lung base, below PET resolution, recommend attention on follow-up chest CT in 3 months. 6. Chronic findings include: Aortic Atherosclerosis (ICD10-I70.0). Cholelithiasis.   02/08/2019 Cancer Staging  Staging form: Larynx - Supraglottis, AJCC 8th Edition - Clinical stage from 02/08/2019: Stage IVA (cT3, cN2c, cM0) - Signed by Eppie Gibson, MD on 02/08/2019    02/11/2019 - 04/07/2019 Chemotherapy   The patient weekly cisplatin for chemotherapy treatment.     07/20/2019 Pathology Results   PROCEDURE: Total glossectomy, total pharyngectomy, total laryngectomy, total thyroidectomy, and cervical esophagectomy TUMOR SITE: Larynx TUMOR LATERALITY: Not specified TUMOR FOCALITY: Unifocal TUMOR SIZE:    GREATEST DIMENSION: 6.9 cm    ADDITIONAL DIMENSIONS: 5.4 x 2.4 cm HISTOLOGIC TYPE: Squamous cell carcinoma, conventional (keratinizing) HISTOLOGIC GRADE:  G1-G2: Well to moderately differentiated TUMOR EXTENSION: Tumor involves the posterior tongue, pharynx, larynx, tracheal cartilage, hyoid bone, cervical esophagus, vagus nerve, carotid artery, deep cervical fascia, ans surrounding skeletal muscle and soft tissue MARGINS: Uninvolved by invasive tumor; Perineural invasion is less than 11mm from the left soft tissue margin LYMPHOVASCULAR INVASION: Not identified PERINEURAL INVASION:  Present REGIONAL LYMPH NODES:     NUMBER OF LYMPH NODES INVOLVED: 2     NUMBER OF LYMPH NODES EXAMINED: 24     LATERALITY OF LYMPH NODES INVOLVED: Cannot determine     SIZE OF LARGEST METASTATIC DEPOSIT: 0.8 cm     EXTRANODAL EXTENSION: Not identified PATHOLOGIC STAGE CLASSIFICATION (pTNM, AJCC 8TH Ed): (r)(y) pT4b pN2a DISTANT METASTASIS (pM): ADDITIONAL PATHOLOGIC FINDINGS: Previous chemoradiation   11/04/2019 Imaging   PET: 1.  Hypermetabolic bilateral neck lesions as detailed above are concerning for disease recurrence. 2.  Compared to 07/08/2019 PET scan, interval development of subcentimeter bilateral upper lobe pulmonary nodules, size below PET resolution. These are concerning for metastasis. Consider diagnostic quality CT chest exam for further evaluation as per oncology treatment planning. 3.  Focal  hypermetabolic uptake in right ventricular cavity of indeterminate significance. Recommend echocardiographic examination to rule out underlying thrombus. This finding was discussed with Dr. Conley Canal by Dr. Clementeen Graham and Dr. Marcello Moores on 11/04/2019 at approximately 3:00 PM via telephone. 4.  Ancillary CT findings as above.   11/05/2019 Imaging   Echocardiogram: Summary   1. The left ventricle is normal in size with normal wall thickness.   2. The left ventricular systolic function is normal, LVEF is visually estimated at 60-65%.   3. The right ventricle is normal in size, with normal systolic function.   4. There is an extrinsic compression of the RV and RA laterally at the AV groove by a mass of 4.3 cm diameter. Unclear if the mass is invasive, although no pericardial effusion seen suggestive no invasion possibly. There is no evidence of hemodynamic compromise secondary to cardiac compression with normal IVC diameter.   12/06/2019 Pathology Results   A. LYMPH NODE, LEFT SUBMANDIBULAR, BIOPSY:  - Squamous cell carcinoma.  - No distinct nodal tissue identified.    12/06/2019 Procedure   Successful ultrasound-guided core biopsy of centrally necrotic left submandibular lymph node. Of note, due to extensive prior postsurgical and post radiation changes, patient has very limited mobility in the neck and the biopsy was extremely challenging    12/16/2019 -  Chemotherapy   The patient had pembrolizumab for chemotherapy treatment.     03/08/2020 PET scan   1. Diminished size of masses in the neck as described. Persistent increased metabolic activity particularly in the LEFT neck. Some of this diminished size on the LEFT could be due to decompression of necrotic material. Correlate with any symptoms of inflammation or infection in this area as well given the paucity of residual soft tissue associated with the above abnormality,  overall findings are compatible with residual disease in this location and there is  no significant surrounding stranding to indicate inflammation that would correlate with the degree of FDG uptake remaining. 2. Diminished activity in the juxta cardiac mass with peripheral activity suggesting interval necrosis, on the prior study this was uniformly hypermetabolic. There is still considerable FDG uptake. 3. No signs of disease in the abdomen or pelvis. 4. 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 Correlate with any new pain or symptoms in this area with further imaging as warranted. No discrete mass or bone destruction on today's study.   06/08/2020 Imaging   CT neck 1. Status post laryngectomy and neck dissection without lymphadenopathy or mass lesion. 2. Short segment severe stenosis of the left internal carotid artery adjacent to a surgical clip.     06/08/2020 Imaging   CT chest 1. There is a large, hypodense lesion within, or very closely abutting and likely intruding upon the right ventricle, as seen on prior examination and approximately 4.6 x 4.3 cm, poorly evaluated due to cardiac motion. Findings are consistent with metastatic lesion and/or thrombus and poorly assessed by ungated CT. Consider echocardiography and cardiac MRI for further assessment.   2. Multiple small pulmonary nodules in the bilateral lung apices are unchanged. Attention on follow-up.   3.  No evidence of new metastatic disease in the chest.   4.  Aortic Atherosclerosis (ICD10-I70.0).     09/07/2020 Imaging   1. Mild decrease in size of juxta cardiac metastatic involving the right ventricle. 2. Small pulmonary nodules are unchanged from previous exam. 3. Aortic atherosclerosis. 4. Gallstone.     12/14/2020 Procedure   Successful exchange and up size from 20 French pull-through gastrostomy to 22 French balloon retention gastrostomy.     REVIEW OF SYSTEMS:   Constitutional: Denies fevers, chills or abnormal weight loss Eyes: Denies blurriness of vision Ears, nose, mouth, throat, and  face: Denies mucositis or sore throat Respiratory: Denies cough, dyspnea or wheezes Cardiovascular: Denies palpitation, chest discomfort or lower extremity swelling Gastrointestinal:  Denies nausea, heartburn or change in bowel habits Skin: Denies abnormal skin rashes Lymphatics: Denies new lymphadenopathy or easy bruising Neurological:Denies numbness, tingling or new weaknesses Behavioral/Psych: Mood is stable, no new changes  All other systems were reviewed with the patient and are negative.  I have reviewed the past medical history, past surgical history, social history and family history with the patient and they are unchanged from previous note.  ALLERGIES:  has No Known Allergies.  MEDICATIONS:  Current Outpatient Medications  Medication Sig Dispense Refill   chlorhexidine (PERIDEX) 0.12 % solution Use as directed 15 mLs in the mouth or throat 2 (two) times daily. Rinse with 15 mls twice daily for 30 seconds. Use after breakfast and at bedtime. Spit out excess. Do not swallow. 473 mL 2   Cholecalciferol (VITAMIN D3) 125 MCG (5000 UT) CAPS Take 5,000 Units by mouth daily.     HYDROcodone-acetaminophen (NORCO/VICODIN) 5-325 MG tablet TAKE 1 TABLET BY MOUTH EVERY 6 HOURS AS NEEDED FOR MODERATE PAIN 60 tablet 0   levothyroxine (SYNTHROID) 137 MCG tablet Take 137 mcg by mouth daily before breakfast.     Nutritional Supplements (KATE FARMS PEPTIDE 1.5) LIQD Take 4 Bottles by mouth daily. 325 mL 11   nystatin (MYCOSTATIN) 100000 UNIT/ML suspension Take 5 mLs by mouth 2 (two) times daily.     ondansetron (ZOFRAN) 8 MG tablet Take 1 tablet (8 mg total) by mouth 2 (two) times daily as needed (  Nausea or vomiting). (Patient taking differently: Take 8 mg by mouth 2 (two) times daily as needed for nausea or vomiting (Nausea or vomiting).) 30 tablet 1   oxymetazoline (AFRIN) 0.05 % nasal spray Place 3 sprays into both nostrils 2 (two) times daily as needed (Bleeding from stoma). 30 mL 0    prochlorperazine (COMPAZINE) 10 MG tablet Take 1 tablet (10 mg total) by mouth every 6 (six) hours as needed (Nausea or vomiting). (Patient taking differently: Take 10 mg by mouth every 6 (six) hours as needed for nausea or vomiting (Nausea or vomiting).) 30 tablet 1   No current facility-administered medications for this visit.   Facility-Administered Medications Ordered in Other Visits  Medication Dose Route Frequency Provider Last Rate Last Admin   heparin lock flush 100 unit/mL  500 Units Intracatheter Once PRN Alvy Bimler, Courtland Coppa, MD       pembrolizumab (KEYTRUDA) 200 mg in sodium chloride 0.9 % 50 mL chemo infusion  200 mg Intravenous Once Alvy Bimler, Riordan Walle, MD       sodium chloride flush (NS) 0.9 % injection 10 mL  10 mL Intracatheter PRN Alvy Bimler, Jaionna Weisse, MD        PHYSICAL EXAMINATION: ECOG PERFORMANCE STATUS: 1 - Symptomatic but completely ambulatory  Vitals:   03/02/21 0904  BP: 115/70  Pulse: 92  Resp: 18  Temp: (!) 97.4 F (36.3 C)  SpO2: 97%   Filed Weights   03/02/21 0904  Weight: 139 lb 12.8 oz (63.4 kg)    GENERAL:alert, no distress and comfortable SKIN: skin color, texture, turgor are normal, no rashes or significant lesions EYES: normal, Conjunctiva are pink and non-injected, sclera clear OROPHARYNX:no exudate, no erythema and lips, buccal mucosa, and tongue normal  NECK: Tracheostomy in situ  LYMPH:  no palpable lymphadenopathy in the cervical, axillary or inguinal LUNGS: clear to auscultation and percussion with normal breathing effort HEART: regular rate & rhythm and no murmurs and no lower extremity edema ABDOMEN:abdomen soft, non-tender and normal bowel sounds Musculoskeletal:no cyanosis of digits and no clubbing  NEURO: alert & oriented , no focal motor/sensory deficits  LABORATORY DATA:  I have reviewed the data as listed    Component Value Date/Time   NA 140 03/02/2021 0843   K 4.0 03/02/2021 0843   CL 103 03/02/2021 0843   CO2 26 03/02/2021 0843   GLUCOSE 98  03/02/2021 0843   BUN 25 (H) 03/02/2021 0843   CREATININE 0.71 03/02/2021 0843   CREATININE 0.60 02/17/2020 1128   CALCIUM 9.1 03/02/2021 0843   PROT 7.4 03/02/2021 0843   ALBUMIN 3.8 03/02/2021 0843   AST 18 03/02/2021 0843   AST 22 02/17/2020 1128   ALT 13 03/02/2021 0843   ALT 24 02/17/2020 1128   ALKPHOS 69 03/02/2021 0843   BILITOT 0.5 03/02/2021 0843   BILITOT 0.5 02/17/2020 1128   GFRNONAA >60 03/02/2021 0843   GFRNONAA >60 02/17/2020 1128   GFRAA >60 06/08/2020 1303   GFRAA >60 02/17/2020 1128    No results found for: SPEP, UPEP  Lab Results  Component Value Date   WBC 3.6 (L) 03/02/2021   NEUTROABS 1.6 (L) 03/02/2021   HGB 12.2 03/02/2021   HCT 36.9 03/02/2021   MCV 86.6 03/02/2021   PLT 211 03/02/2021      Chemistry      Component Value Date/Time   NA 140 03/02/2021 0843   K 4.0 03/02/2021 0843   CL 103 03/02/2021 0843   CO2 26 03/02/2021 0843   BUN 25 (H)  03/02/2021 0843   CREATININE 0.71 03/02/2021 0843   CREATININE 0.60 02/17/2020 1128      Component Value Date/Time   CALCIUM 9.1 03/02/2021 0843   ALKPHOS 69 03/02/2021 0843   AST 18 03/02/2021 0843   AST 22 02/17/2020 1128   ALT 13 03/02/2021 0843   ALT 24 02/17/2020 1128   BILITOT 0.5 03/02/2021 0843   BILITOT 0.5 02/17/2020 1128       RADIOGRAPHIC STUDIES: I have personally reviewed the radiological images as listed and agreed with the findings in the report. DG Chest Port 1 View  Result Date: 02/12/2021 CLINICAL DATA:  Hemoptysis EXAM: PORTABLE CHEST 1 VIEW COMPARISON:  09/07/2020 CT FINDINGS: Cardiac shadow is within normal limits. Right-sided chest wall port is noted in satisfactory position. Postsurgical changes are noted in the neck consistent with the given clinical history of laryngeal carcinoma. Postsurgical changes in the left axilla are noted as well. Lungs are clear bilaterally. No focal infiltrate or effusion is noted. No bony abnormality is seen. Tracheostomy tube is noted in  place. IMPRESSION: No acute abnormality noted. Electronically Signed   By: Inez Catalina M.D.   On: 02/12/2021 14:43

## 2021-03-02 NOTE — Assessment & Plan Note (Signed)
Her dysphagia is not better She is able to swallow pills but not food For now, she will continue nutritional intake through feeding tube

## 2021-03-02 NOTE — Patient Instructions (Signed)
[facility] ONCOLOGY  Discharge Instructions: Thank you for choosing [facility] to provide your oncology and hematology care.   If you have a lab appointment with the [facility], please go directly to the [facility] and check in at the registration area.   Wear comfortable clothing and clothing appropriate for easy access to any Portacath or PICC line.   We strive to give you quality time with your provider. You may need to reschedule your appointment if you arrive late (15 or more minutes).  Arriving late affects you and other patients whose appointments are after yours.  Also, if you miss three or more appointments without notifying the office, you may be dismissed from the clinic at the provider's discretion.      For prescription refill requests, have your pharmacy contact our office and allow 72 hours for refills to be completed.    Today you received the following chemotherapy and/or immunotherapy agents [name]      To help prevent nausea and vomiting after your treatment, we encourage you to take your nausea medication as directed.  BELOW ARE SYMPTOMS THAT SHOULD BE REPORTED IMMEDIATELY: *FEVER GREATER THAN 100.4 F (38 C) OR HIGHER *CHILLS OR SWEATING *NAUSEA AND VOMITING THAT IS NOT CONTROLLED WITH YOUR NAUSEA MEDICATION *UNUSUAL SHORTNESS OF BREATH *UNUSUAL BRUISING OR BLEEDING *URINARY PROBLEMS (pain or burning when urinating, or frequent urination) *BOWEL PROBLEMS (unusual diarrhea, constipation, pain near the anus) TENDERNESS IN MOUTH AND THROAT WITH OR WITHOUT PRESENCE OF ULCERS (sore throat, sores in mouth, or a toothache) UNUSUAL RASH, SWELLING OR PAIN  UNUSUAL VAGINAL DISCHARGE OR ITCHING   Items with * indicate a potential emergency and should be followed up as soon as possible or go to the Emergency Department if any problems should occur.  Please show the CHEMOTHERAPY ALERT CARD or IMMUNOTHERAPY ALERT CARD at check-in to  the Emergency Department and triage nurse.  Should you have questions after your visit or need to cancel or reschedule your appointment, please contact [facility]  Dept: [phone]  and follow the prompts.  Office hours are 8:00 a.m. to 4:30 p.m. Monday - Friday. Please note that voicemails left after 4:00 p.m. may not be returned until the following business day.  We are closed weekends and major holidays. You have access to a nurse at all times for urgent questions. Please call the main number to the clinic Dept: [phone] and follow the prompts.   For any non-urgent questions, you may also contact your provider using MyChart. We now offer e-Visits for anyone 101 and older to request care online for non-urgent symptoms. For details visit [email or web address].   Also download the MyChart app! Go to the app store, search "MyChart", open the app, select [facility], and log in with your MyChart username and password.  Due to Covid, a mask is required upon entering the hospital/clinic. If you do not have a mask, one will be given to you upon arrival. For doctor visits, patients may have 1 support person aged 68 or older with them. For treatment visits, patients cannot have anyone with them due to current Covid guidelines and our immunocompromised population.

## 2021-03-02 NOTE — Assessment & Plan Note (Signed)
Her hemoptysis has resolved She is scheduled for CT imaging at [facility] on July 8 with follow-up to see ENT surgeon then I encouraged her to keep those appointment and follow-up In the meantime, we will continue immunotherapy

## 2021-03-05 DIAGNOSIS — C321 Malignant neoplasm of supraglottis: Secondary | ICD-10-CM | POA: Diagnosis not present

## 2021-03-05 DIAGNOSIS — R633 Feeding difficulties, unspecified: Secondary | ICD-10-CM | POA: Diagnosis not present

## 2021-03-20 DIAGNOSIS — E781 Pure hyperglyceridemia: Secondary | ICD-10-CM | POA: Diagnosis not present

## 2021-03-20 DIAGNOSIS — E039 Hypothyroidism, unspecified: Secondary | ICD-10-CM | POA: Diagnosis not present

## 2021-03-20 DIAGNOSIS — Z6825 Body mass index (BMI) 25.0-25.9, adult: Secondary | ICD-10-CM | POA: Diagnosis not present

## 2021-03-23 ENCOUNTER — Inpatient Hospital Stay: Payer: Medicare HMO | Admitting: Dietician

## 2021-03-23 ENCOUNTER — Inpatient Hospital Stay: Payer: Medicare HMO

## 2021-03-23 ENCOUNTER — Inpatient Hospital Stay (HOSPITAL_BASED_OUTPATIENT_CLINIC_OR_DEPARTMENT_OTHER): Payer: Medicare HMO | Admitting: Hematology and Oncology

## 2021-03-23 ENCOUNTER — Encounter: Payer: Self-pay | Admitting: Hematology and Oncology

## 2021-03-23 ENCOUNTER — Other Ambulatory Visit: Payer: Self-pay

## 2021-03-23 ENCOUNTER — Other Ambulatory Visit (HOSPITAL_COMMUNITY): Payer: Self-pay

## 2021-03-23 ENCOUNTER — Inpatient Hospital Stay: Payer: Medicare HMO | Attending: Hematology

## 2021-03-23 DIAGNOSIS — Z79899 Other long term (current) drug therapy: Secondary | ICD-10-CM | POA: Diagnosis not present

## 2021-03-23 DIAGNOSIS — C321 Malignant neoplasm of supraglottis: Secondary | ICD-10-CM

## 2021-03-23 DIAGNOSIS — Z923 Personal history of irradiation: Secondary | ICD-10-CM | POA: Insufficient documentation

## 2021-03-23 DIAGNOSIS — Z930 Tracheostomy status: Secondary | ICD-10-CM | POA: Diagnosis not present

## 2021-03-23 DIAGNOSIS — E039 Hypothyroidism, unspecified: Secondary | ICD-10-CM

## 2021-03-23 DIAGNOSIS — G893 Neoplasm related pain (acute) (chronic): Secondary | ICD-10-CM | POA: Insufficient documentation

## 2021-03-23 DIAGNOSIS — Z95828 Presence of other vascular implants and grafts: Secondary | ICD-10-CM

## 2021-03-23 DIAGNOSIS — R1310 Dysphagia, unspecified: Secondary | ICD-10-CM

## 2021-03-23 DIAGNOSIS — Z5112 Encounter for antineoplastic immunotherapy: Secondary | ICD-10-CM | POA: Insufficient documentation

## 2021-03-23 DIAGNOSIS — Z87891 Personal history of nicotine dependence: Secondary | ICD-10-CM | POA: Diagnosis not present

## 2021-03-23 DIAGNOSIS — C329 Malignant neoplasm of larynx, unspecified: Secondary | ICD-10-CM

## 2021-03-23 LAB — CBC WITH DIFFERENTIAL/PLATELET
Abs Immature Granulocytes: 0.01 10*3/uL (ref 0.00–0.07)
Basophils Absolute: 0 10*3/uL (ref 0.0–0.1)
Basophils Relative: 0 %
Eosinophils Absolute: 0.4 10*3/uL (ref 0.0–0.5)
Eosinophils Relative: 8 %
HCT: 39.7 % (ref 36.0–46.0)
Hemoglobin: 13.4 g/dL (ref 12.0–15.0)
Immature Granulocytes: 0 %
Lymphocytes Relative: 27 %
Lymphs Abs: 1.2 10*3/uL (ref 0.7–4.0)
MCH: 29.7 pg (ref 26.0–34.0)
MCHC: 33.8 g/dL (ref 30.0–36.0)
MCV: 88 fL (ref 80.0–100.0)
Monocytes Absolute: 0.5 10*3/uL (ref 0.1–1.0)
Monocytes Relative: 12 %
Neutro Abs: 2.4 10*3/uL (ref 1.7–7.7)
Neutrophils Relative %: 53 %
Platelets: 216 10*3/uL (ref 150–400)
RBC: 4.51 MIL/uL (ref 3.87–5.11)
RDW: 14.2 % (ref 11.5–15.5)
WBC: 4.6 10*3/uL (ref 4.0–10.5)
nRBC: 0 % (ref 0.0–0.2)

## 2021-03-23 LAB — COMPREHENSIVE METABOLIC PANEL
ALT: 19 U/L (ref 0–44)
AST: 22 U/L (ref 15–41)
Albumin: 4.4 g/dL (ref 3.5–5.0)
Alkaline Phosphatase: 59 U/L (ref 38–126)
Anion gap: 9 (ref 5–15)
BUN: 22 mg/dL (ref 8–23)
CO2: 28 mmol/L (ref 22–32)
Calcium: 9.3 mg/dL (ref 8.9–10.3)
Chloride: 102 mmol/L (ref 98–111)
Creatinine, Ser: 0.63 mg/dL (ref 0.44–1.00)
GFR, Estimated: >60 mL/min (ref >60)
Glucose, Bld: 100 mg/dL — ABNORMAL HIGH (ref 70–99)
Potassium: 3.9 mmol/L (ref 3.5–5.1)
Sodium: 139 mmol/L (ref 135–145)
Total Bilirubin: 0.4 mg/dL (ref 0.3–1.2)
Total Protein: 7.7 g/dL (ref 6.5–8.1)

## 2021-03-23 LAB — TSH: TSH: 1.695 u[IU]/mL (ref 0.308–3.960)

## 2021-03-23 MED ORDER — SODIUM CHLORIDE 0.9 % IV SOLN
Freq: Once | INTRAVENOUS | Status: AC
  Filled 2021-03-23: qty 250

## 2021-03-23 MED ORDER — HEPARIN SOD (PORK) LOCK FLUSH 100 UNIT/ML IV SOLN
500.0000 [IU] | Freq: Once | INTRAVENOUS | Status: AC | PRN
  Administered 2021-03-23: 500 [IU]
  Filled 2021-03-23: qty 5

## 2021-03-23 MED ORDER — HYDROCODONE-ACETAMINOPHEN 5-325 MG PO TABS
1.0000 | ORAL_TABLET | Freq: Four times a day (QID) | ORAL | 0 refills | Status: DC | PRN
  Filled 2021-03-23: qty 60, 15d supply, fill #0

## 2021-03-23 MED ORDER — SODIUM CHLORIDE 0.9 % IV SOLN
200.0000 mg | Freq: Once | INTRAVENOUS | Status: AC
  Administered 2021-03-23: 200 mg via INTRAVENOUS
  Filled 2021-03-23: qty 8

## 2021-03-23 MED ORDER — SODIUM CHLORIDE 0.9% FLUSH
10.0000 mL | Freq: Once | INTRAVENOUS | Status: AC
  Administered 2021-03-23: 10 mL
  Filled 2021-03-23: qty 10

## 2021-03-23 MED ORDER — SODIUM CHLORIDE 0.9% FLUSH
10.0000 mL | INTRAVENOUS | Status: DC | PRN
  Administered 2021-03-23: 10 mL
  Filled 2021-03-23: qty 10

## 2021-03-23 NOTE — Assessment & Plan Note (Signed)
Her dysphagia is not better She is able to swallow pills but not food For now, she will continue nutritional intake through feeding tube

## 2021-03-23 NOTE — Progress Notes (Signed)
[place] OFFICE PROGRESS NOTE  Patient Care Team: Hamrick, Lorin Mercy, MD as PCP - General (Family Medicine) Heath Lark, MD as Consulting Physician (Hematology and Oncology)  ASSESSMENT & PLAN:  Laryngeal squamous cell carcinoma [facility]) Her hemoptysis has resolved She is scheduled for CT imaging at [facility] on July 8 with follow-up to see ENT surgeon then I encouraged her to keep those appointment and follow-up In the meantime, we will continue immunotherapy  Cancer associated pain She is able to reduce the amount of pain medicine required She will continue pain medicine taper as tolerated I have refilled her prescription pain medicine today  Hypothyroidism We monitor her thyroid function closely We will adjust the dose of Synthroid as needed  Dysphagia Her dysphagia is not better She is able to swallow pills but not food For now, she will continue nutritional intake through feeding tube  No orders of the defined types were placed in this encounter.   All questions were answered. The patient knows to call the clinic with any problems, questions or concerns. The total time spent in the appointment was 20 minutes encounter with patients including review of chart and various tests results, discussions about plan of care and coordination of care plan   Heath Lark, MD 03/23/2021 9:39 AM  INTERVAL HISTORY: Please see below for problem oriented charting. She returns with her son for further follow-up We communicated through writing Her pain is well controlled She continues to have dysphagia  SUMMARY OF ONCOLOGIC HISTORY: Oncology History  Laryngeal squamous cell carcinoma ([place])  12/02/2018 Imaging   CT neck: IMPRESSION: 1. Supraglottic laryngeal carcinoma extending across the anterior commissure and invading the prelaryngeal fat, left aryepiglottic fold and left aspect of the epiglottis. 2. Severe narrowing of the laryngeal airway. 3. Bilateral subcentimeter  level 2A and 2A cervical lymph nodes.     12/02/2018 Imaging   CT  Chest:  IMPRESSION: No evidence of metastatic disease in the chest.    12/03/2018 Pathology Results   (Care Everywhere; [facility]) A: Larynx, supraglottis, biopsy - Atypical squamous proliferation with at least low grade dysplasia and abundant keratinization - No definite high grade dysplasia or invasive carcinoma represented (see comment)    12/18/2018 Pathology Results   (Care Everywhere; [facility]) A:  Neck, left, fine needle aspiration - Rare single atypical cells, no cells diagnostic of malignancy - Lymph node material present  B:  Neck, right, fine needle aspiration - Non-diagnostic specimen - No lymph node material identified - Mixed inflammation and blood    12/31/2018 Pathology Results   (Care Everywhere; [facility])  A: Larynx, left supraglottic mass, biopsy - Atypical squamous proliferation with abundant keratinization, suspicious for well-differentiated invasive squamous cell carcinoma  B: Larynx, left supraglottic mass, biopsy - Well-differentiated invasive squamous cell carcinoma with abundant keratinization (see comment)    01/18/2019 Initial Diagnosis   Laryngeal cancer ([place])    01/26/2019 Imaging   1. Locally advanced hypermetabolic laryngeal neoplasm centered in the left supraglottic space with bilateral glottic and epiglottic involvement and subglottic extension on the left. 2. Hypermetabolic bilateral level 3 neck nodal metastases. 3. Hypermetabolic high mediastinal nodal metastases between the trachea and upper thoracic esophagus. 4. Otherwise no distant hypermetabolic metastatic disease. 5. Nodular 7 mm focus at the left lung base, below PET resolution, recommend attention on follow-up chest CT in 3 months. 6. Chronic findings include: Aortic Atherosclerosis (ICD10-I70.0). Cholelithiasis.   02/08/2019 Cancer Staging   Staging form: Larynx - Supraglottis, AJCC 8th  Edition -  Clinical stage from 02/08/2019: Stage IVA (cT3, cN2c, cM0) - Signed by Eppie Gibson, MD on 02/08/2019    02/11/2019 - 04/07/2019 Chemotherapy   The patient weekly cisplatin for chemotherapy treatment.     07/20/2019 Pathology Results   PROCEDURE: Total glossectomy, total pharyngectomy, total laryngectomy, total thyroidectomy, and cervical esophagectomy TUMOR SITE: Larynx TUMOR LATERALITY: Not specified TUMOR FOCALITY: Unifocal TUMOR SIZE:    GREATEST DIMENSION: 6.9 cm    ADDITIONAL DIMENSIONS: 5.4 x 2.4 cm HISTOLOGIC TYPE: Squamous cell carcinoma, conventional (keratinizing) HISTOLOGIC GRADE:  G1-G2: Well to moderately differentiated TUMOR EXTENSION: Tumor involves the posterior tongue, pharynx, larynx, tracheal cartilage, hyoid bone, cervical esophagus, vagus nerve, carotid artery, deep cervical fascia, ans surrounding skeletal muscle and soft tissue MARGINS: Uninvolved by invasive tumor; Perineural invasion is less than 38mm from the left soft tissue margin LYMPHOVASCULAR INVASION: Not identified PERINEURAL INVASION:  Present REGIONAL LYMPH NODES:     NUMBER OF LYMPH NODES INVOLVED: 2     NUMBER OF LYMPH NODES EXAMINED: 24     LATERALITY OF LYMPH NODES INVOLVED: Cannot determine     SIZE OF LARGEST METASTATIC DEPOSIT: 0.8 cm     EXTRANODAL EXTENSION: Not identified PATHOLOGIC STAGE CLASSIFICATION (pTNM, AJCC 8TH Ed): (r)(y) pT4b pN2a DISTANT METASTASIS (pM): ADDITIONAL PATHOLOGIC FINDINGS: Previous chemoradiation   11/04/2019 Imaging   PET: 1.  Hypermetabolic bilateral neck lesions as detailed above are concerning for disease recurrence. 2.  Compared to 07/08/2019 PET scan, interval development of subcentimeter bilateral upper lobe pulmonary nodules, size below PET resolution. These are concerning for metastasis. Consider diagnostic quality CT chest exam for further evaluation as per oncology treatment planning. 3.  Focal hypermetabolic uptake in right ventricular cavity of  indeterminate significance. Recommend echocardiographic examination to rule out underlying thrombus. This finding was discussed with Dr. Conley Canal by Dr. Clementeen Graham and Dr. Marcello Moores on 11/04/2019 at approximately 3:00 PM via telephone. 4.  Ancillary CT findings as above.   11/05/2019 Imaging   Echocardiogram: Summary   1. The left ventricle is normal in size with normal wall thickness.   2. The left ventricular systolic function is normal, LVEF is visually estimated at 60-65%.   3. The right ventricle is normal in size, with normal systolic function.   4. There is an extrinsic compression of the RV and RA laterally at the AV groove by a mass of 4.3 cm diameter. Unclear if the mass is invasive, although no pericardial effusion seen suggestive no invasion possibly. There is no evidence of hemodynamic compromise secondary to cardiac compression with normal IVC diameter.   12/06/2019 Pathology Results   A. LYMPH NODE, LEFT SUBMANDIBULAR, BIOPSY:  - Squamous cell carcinoma.  - No distinct nodal tissue identified.    12/06/2019 Procedure   Successful ultrasound-guided core biopsy of centrally necrotic left submandibular lymph node. Of note, due to extensive prior postsurgical and post radiation changes, patient has very limited mobility in the neck and the biopsy was extremely challenging    12/16/2019 -  Chemotherapy   The patient had pembrolizumab for chemotherapy treatment.     03/08/2020 PET scan   1. Diminished size of masses in the neck as described. Persistent increased metabolic activity particularly in the LEFT neck. Some of this diminished size on the LEFT could be due to decompression of necrotic material. Correlate with any symptoms of inflammation or infection in this area as well given the paucity of residual soft tissue associated with the above abnormality, overall findings are compatible with residual  disease in this location and there is no significant surrounding stranding to indicate  inflammation that would correlate with the degree of FDG uptake remaining. 2. Diminished activity in the juxta cardiac mass with peripheral activity suggesting interval necrosis, on the prior study this was uniformly hypermetabolic. There is still considerable FDG uptake. 3. No signs of disease in the abdomen or pelvis. 4. 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 Correlate with any new pain or symptoms in this area with further imaging as warranted. No discrete mass or bone destruction on today's study.   06/08/2020 Imaging   CT neck 1. Status post laryngectomy and neck dissection without lymphadenopathy or mass lesion. 2. Short segment severe stenosis of the left internal carotid artery adjacent to a surgical clip.     06/08/2020 Imaging   CT chest 1. There is a large, hypodense lesion within, or very closely abutting and likely intruding upon the right ventricle, as seen on prior examination and approximately 4.6 x 4.3 cm, poorly evaluated due to cardiac motion. Findings are consistent with metastatic lesion and/or thrombus and poorly assessed by ungated CT. Consider echocardiography and cardiac MRI for further assessment.   2. Multiple small pulmonary nodules in the bilateral lung apices are unchanged. Attention on follow-up.   3.  No evidence of new metastatic disease in the chest.   4.  Aortic Atherosclerosis (ICD10-I70.0).     09/07/2020 Imaging   1. Mild decrease in size of juxta cardiac metastatic involving the right ventricle. 2. Small pulmonary nodules are unchanged from previous exam. 3. Aortic atherosclerosis. 4. Gallstone.     12/14/2020 Procedure   Successful exchange and up size from 20 French pull-through gastrostomy to 22 French balloon retention gastrostomy.     REVIEW OF SYSTEMS:   Constitutional: Denies fevers, chills or abnormal weight loss Eyes: Denies blurriness of vision Ears, nose, mouth, throat, and face: Denies mucositis or sore throat Respiratory:  Denies cough, dyspnea or wheezes Cardiovascular: Denies palpitation, chest discomfort or lower extremity swelling Gastrointestinal:  Denies nausea, heartburn or change in bowel habits Skin: Denies abnormal skin rashes Lymphatics: Denies new lymphadenopathy or easy bruising Neurological:Denies numbness, tingling or new weaknesses Behavioral/Psych: Mood is stable, no new changes  All other systems were reviewed with the patient and are negative.  I have reviewed the past medical history, past surgical history, social history and family history with the patient and they are unchanged from previous note.  ALLERGIES:  has No Known Allergies.  MEDICATIONS:  Current Outpatient Medications  Medication Sig Dispense Refill   chlorhexidine (PERIDEX) 0.12 % solution Use as directed 15 mLs in the mouth or throat 2 (two) times daily. Rinse with 15 mls twice daily for 30 seconds. Use after breakfast and at bedtime. Spit out excess. Do not swallow. 473 mL 2   Cholecalciferol (VITAMIN D3) 125 MCG (5000 UT) CAPS Take 5,000 Units by mouth daily.     HYDROcodone-acetaminophen (NORCO/VICODIN) 5-325 MG tablet TAKE 1 TABLET BY MOUTH EVERY 6 HOURS AS NEEDED FOR MODERATE PAIN 60 tablet 0   levothyroxine (SYNTHROID) 137 MCG tablet Take 137 mcg by mouth daily before breakfast.     Nutritional Supplements (KATE FARMS PEPTIDE 1.5) LIQD Take 4 Bottles by mouth daily. 325 mL 11   nystatin (MYCOSTATIN) 100000 UNIT/ML suspension Take 5 mLs by mouth 2 (two) times daily.     ondansetron (ZOFRAN) 8 MG tablet Take 1 tablet (8 mg total) by mouth 2 (two) times daily as needed (Nausea or vomiting). (Patient taking differently:  Take 8 mg by mouth 2 (two) times daily as needed for nausea or vomiting (Nausea or vomiting).) 30 tablet 1   oxymetazoline (AFRIN) 0.05 % nasal spray Place 3 sprays into both nostrils 2 (two) times daily as needed (Bleeding from stoma). 30 mL 0   prochlorperazine (COMPAZINE) 10 MG tablet Take 1 tablet (10 mg  total) by mouth every 6 (six) hours as needed (Nausea or vomiting). (Patient taking differently: Take 10 mg by mouth every 6 (six) hours as needed for nausea or vomiting (Nausea or vomiting).) 30 tablet 1   No current facility-administered medications for this visit.    PHYSICAL EXAMINATION: ECOG PERFORMANCE STATUS: 0 - Asymptomatic  Vitals:   03/23/21 0853  BP: 117/71  Pulse: 86  Resp: 18  Temp: 98 F (36.7 C)  SpO2: 98%   Filed Weights   03/23/21 0853  Weight: 140 lb 3.2 oz (63.6 kg)    GENERAL:alert, no distress and comfortable SKIN: skin color, texture, turgor are normal, no rashes or significant lesions EYES: normal, Conjunctiva are pink and non-injected, sclera clear OROPHARYNX:no exudate, no erythema and lips, buccal mucosa, and tongue normal.  No oral thrush NECK: Tracheostomy in situ LYMPH:  no palpable lymphadenopathy in the cervical, axillary or inguinal LUNGS: clear to auscultation and percussion with normal breathing effort HEART: regular rate & rhythm and no murmurs and no lower extremity edema ABDOMEN:abdomen soft, non-tender and normal bowel sounds Musculoskeletal:no cyanosis of digits and no clubbing  NEURO: alert & oriented   LABORATORY DATA:  I have reviewed the data as listed    Component Value Date/Time   NA 140 03/02/2021 0843   K 4.0 03/02/2021 0843   CL 103 03/02/2021 0843   CO2 26 03/02/2021 0843   GLUCOSE 98 03/02/2021 0843   BUN 25 (H) 03/02/2021 0843   CREATININE 0.71 03/02/2021 0843   CREATININE 0.60 02/17/2020 1128   CALCIUM 9.1 03/02/2021 0843   PROT 7.4 03/02/2021 0843   ALBUMIN 3.8 03/02/2021 0843   AST 18 03/02/2021 0843   AST 22 02/17/2020 1128   ALT 13 03/02/2021 0843   ALT 24 02/17/2020 1128   ALKPHOS 69 03/02/2021 0843   BILITOT 0.5 03/02/2021 0843   BILITOT 0.5 02/17/2020 1128   GFRNONAA >60 03/02/2021 0843   GFRNONAA >60 02/17/2020 1128   GFRAA >60 06/08/2020 1303   GFRAA >60 02/17/2020 1128    No results found for:  SPEP, UPEP  Lab Results  Component Value Date   WBC 4.6 03/23/2021   NEUTROABS 2.4 03/23/2021   HGB 13.4 03/23/2021   HCT 39.7 03/23/2021   MCV 88.0 03/23/2021   PLT 216 03/23/2021      Chemistry      Component Value Date/Time   NA 140 03/02/2021 0843   K 4.0 03/02/2021 0843   CL 103 03/02/2021 0843   CO2 26 03/02/2021 0843   BUN 25 (H) 03/02/2021 0843   CREATININE 0.71 03/02/2021 0843   CREATININE 0.60 02/17/2020 1128      Component Value Date/Time   CALCIUM 9.1 03/02/2021 0843   ALKPHOS 69 03/02/2021 0843   AST 18 03/02/2021 0843   AST 22 02/17/2020 1128   ALT 13 03/02/2021 0843   ALT 24 02/17/2020 1128   BILITOT 0.5 03/02/2021 0843   BILITOT 0.5 02/17/2020 1128

## 2021-03-23 NOTE — Patient Instructions (Signed)

## 2021-03-23 NOTE — Assessment & Plan Note (Signed)
She is able to reduce the amount of pain medicine required She will continue pain medicine taper as tolerated I have refilled her prescription pain medicine today

## 2021-03-23 NOTE — Assessment & Plan Note (Signed)
We monitor her thyroid function closely We will adjust the dose of Synthroid as needed

## 2021-03-23 NOTE — Assessment & Plan Note (Signed)
Her hemoptysis has resolved She is scheduled for CT imaging at [facility] on July 8 with follow-up to see ENT surgeon then I encouraged her to keep those appointment and follow-up In the meantime, we will continue immunotherapy

## 2021-03-23 NOTE — Patient Instructions (Signed)
[facility] MEDICAL [facility]COLOG[facility]  Discharge Instructions: ?Thank you for choosing [facility] to provide your oncology and hematology care.  ? ?If you have a lab appointment with the Cancer Center, please go directly to the Cancer Center and check in at the registration area. ?  ?Wear comfortable clothing and clothing appropriate for easy access to any Portacath or PICC line.  ? ?We strive to give you quality time with your provider. You may need to reschedule your appointment if you arrive late (15 or more minutes).  Arriving late affects you and other patients whose appointments are after yours.  Also, if you miss three or more appointments without notifying the office, you may be dismissed from the clinic at the provider?s discretion.    ?  ?For prescription refill requests, have your pharmacy contact our office and allow 72 hours for refills to be completed.   ? ?Today you received the following chemotherapy and/or immunotherapy agents: Keytruda ?  ?To help prevent nausea and vomiting after your treatment, we encourage you to take your nausea medication as directed. ? ?BELOW ARE SYMPTOMS THAT SHOULD BE REPORTED IMMEDIATELY: ?*FEVER GREATER THAN 100.4 F (38 ?C) OR HIGHER ?*CHILLS OR SWEATING ?*NAUSEA AND VOMITING THAT IS NOT CONTROLLED WITH YOUR NAUSEA MEDICATION ?*UNUSUAL SHORTNESS OF BREATH ?*UNUSUAL BRUISING OR BLEEDING ?*URINARY PROBLEMS (pain or burning when urinating, or frequent urination) ?*BOWEL PROBLEMS (unusual diarrhea, constipation, pain near the anus) ?TENDERNESS IN MOUTH AND THROAT WITH OR WITHOUT PRESENCE OF ULCERS (sore throat, sores in mouth, or a toothache) ?UNUSUAL RASH, SWELLING OR PAIN  ?UNUSUAL VAGINAL DISCHARGE OR ITCHING  ? ?Items with * indicate a potential emergency and should be followed up as soon as possible or go to the Emergency Department if any problems should occur. ? ?Please show the CHEMOTHERAPY ALERT CARD or IMMUNOTHERAPY ALERT CARD at check-in to the  Emergency Department and triage nurse. ? ?Should you have questions after your visit or need to cancel or reschedule your appointment, please contact [facility]  Dept: [phone]  and follow the prompts.  Office hours are 8:00 a.m. to 4:30 p.m. Monday - Friday. Please note that voicemails left after 4:00 p.m. may not be returned until the following business day.  We are closed weekends and major holidays. You have access to a nurse at all times for urgent questions. Please call the main number to the clinic Dept: [phone] and follow the prompts. ? ? ?For any non-urgent questions, you may also contact your provider using MyChart. We now offer e-Visits for anyone 18 and older to request care online for non-urgent symptoms. For details visit [email or web address]. ?  ?Also download the MyChart app! Go to the app store, search "MyChart", open the app, select , and log in with your MyChart username and password. ? ?Due to Covid, a mask is required upon entering the hospital/clinic. If you do not have a mask, one will be given to you upon arrival. For doctor visits, patients may have 1 support person aged 18 or older with them. For treatment visits, patients cannot have anyone with them due to current Covid guidelines and our immunocompromised population.  ? ?

## 2021-03-30 DIAGNOSIS — C329 Malignant neoplasm of larynx, unspecified: Secondary | ICD-10-CM | POA: Diagnosis not present

## 2021-04-13 ENCOUNTER — Other Ambulatory Visit (HOSPITAL_COMMUNITY): Payer: Self-pay

## 2021-04-13 ENCOUNTER — Encounter: Payer: Self-pay | Admitting: Hematology and Oncology

## 2021-04-13 ENCOUNTER — Inpatient Hospital Stay: Payer: Medicare HMO

## 2021-04-13 ENCOUNTER — Other Ambulatory Visit: Payer: Self-pay

## 2021-04-13 ENCOUNTER — Inpatient Hospital Stay (HOSPITAL_BASED_OUTPATIENT_CLINIC_OR_DEPARTMENT_OTHER): Payer: Medicare HMO | Admitting: Hematology and Oncology

## 2021-04-13 ENCOUNTER — Inpatient Hospital Stay: Payer: Medicare HMO | Admitting: Dietician

## 2021-04-13 VITALS — BP 113/74 | HR 82 | Temp 97.6°F | Resp 18 | Ht 62.0 in | Wt 143.0 lb

## 2021-04-13 DIAGNOSIS — R1310 Dysphagia, unspecified: Secondary | ICD-10-CM | POA: Diagnosis not present

## 2021-04-13 DIAGNOSIS — Z923 Personal history of irradiation: Secondary | ICD-10-CM | POA: Diagnosis not present

## 2021-04-13 DIAGNOSIS — E039 Hypothyroidism, unspecified: Secondary | ICD-10-CM

## 2021-04-13 DIAGNOSIS — G893 Neoplasm related pain (acute) (chronic): Secondary | ICD-10-CM

## 2021-04-13 DIAGNOSIS — Z930 Tracheostomy status: Secondary | ICD-10-CM | POA: Diagnosis not present

## 2021-04-13 DIAGNOSIS — C321 Malignant neoplasm of supraglottis: Secondary | ICD-10-CM

## 2021-04-13 DIAGNOSIS — C329 Malignant neoplasm of larynx, unspecified: Secondary | ICD-10-CM | POA: Diagnosis not present

## 2021-04-13 DIAGNOSIS — Z79899 Other long term (current) drug therapy: Secondary | ICD-10-CM | POA: Diagnosis not present

## 2021-04-13 DIAGNOSIS — Z87891 Personal history of nicotine dependence: Secondary | ICD-10-CM | POA: Diagnosis not present

## 2021-04-13 DIAGNOSIS — C7800 Secondary malignant neoplasm of unspecified lung: Secondary | ICD-10-CM

## 2021-04-13 DIAGNOSIS — R918 Other nonspecific abnormal finding of lung field: Secondary | ICD-10-CM | POA: Diagnosis not present

## 2021-04-13 DIAGNOSIS — Z5112 Encounter for antineoplastic immunotherapy: Secondary | ICD-10-CM | POA: Diagnosis not present

## 2021-04-13 DIAGNOSIS — Z95828 Presence of other vascular implants and grafts: Secondary | ICD-10-CM

## 2021-04-13 LAB — CBC WITH DIFFERENTIAL/PLATELET
Abs Immature Granulocytes: 0.01 10*3/uL (ref 0.00–0.07)
Basophils Absolute: 0 10*3/uL (ref 0.0–0.1)
Basophils Relative: 0 %
Eosinophils Absolute: 0.4 10*3/uL (ref 0.0–0.5)
Eosinophils Relative: 10 %
HCT: 39.4 % (ref 36.0–46.0)
Hemoglobin: 13.2 g/dL (ref 12.0–15.0)
Immature Granulocytes: 0 %
Lymphocytes Relative: 29 %
Lymphs Abs: 1.2 10*3/uL (ref 0.7–4.0)
MCH: 29.1 pg (ref 26.0–34.0)
MCHC: 33.5 g/dL (ref 30.0–36.0)
MCV: 86.8 fL (ref 80.0–100.0)
Monocytes Absolute: 0.6 10*3/uL (ref 0.1–1.0)
Monocytes Relative: 14 %
Neutro Abs: 2 10*3/uL (ref 1.7–7.7)
Neutrophils Relative %: 47 %
Platelets: 226 10*3/uL (ref 150–400)
RBC: 4.54 MIL/uL (ref 3.87–5.11)
RDW: 13.8 % (ref 11.5–15.5)
WBC: 4.2 10*3/uL (ref 4.0–10.5)
nRBC: 0 % (ref 0.0–0.2)

## 2021-04-13 LAB — CMP (CANCER CENTER ONLY)
ALT: 17 U/L (ref 0–44)
AST: 22 U/L (ref 15–41)
Albumin: 4.3 g/dL (ref 3.5–5.0)
Alkaline Phosphatase: 56 U/L (ref 38–126)
Anion gap: 12 (ref 5–15)
BUN: 23 mg/dL (ref 8–23)
CO2: 28 mmol/L (ref 22–32)
Calcium: 9.4 mg/dL (ref 8.9–10.3)
Chloride: 101 mmol/L (ref 98–111)
Creatinine: 0.71 mg/dL (ref 0.44–1.00)
GFR, Estimated: >60 mL/min (ref >60)
Glucose, Bld: 103 mg/dL — ABNORMAL HIGH (ref 70–99)
Potassium: 4.2 mmol/L (ref 3.5–5.1)
Sodium: 141 mmol/L (ref 135–145)
Total Bilirubin: 0.5 mg/dL (ref 0.3–1.2)
Total Protein: 7.6 g/dL (ref 6.5–8.1)

## 2021-04-13 LAB — TSH: TSH: 1.324 u[IU]/mL (ref 0.308–3.960)

## 2021-04-13 MED ORDER — SODIUM CHLORIDE 0.9 % IV SOLN
Freq: Once | INTRAVENOUS | Status: AC
  Filled 2021-04-13: qty 250

## 2021-04-13 MED ORDER — SODIUM CHLORIDE 0.9 % IV SOLN
200.0000 mg | Freq: Once | INTRAVENOUS | Status: AC
  Administered 2021-04-13: 200 mg via INTRAVENOUS
  Filled 2021-04-13: qty 8

## 2021-04-13 MED ORDER — SODIUM CHLORIDE 0.9% FLUSH
10.0000 mL | Freq: Once | INTRAVENOUS | Status: AC
  Administered 2021-04-13: 10 mL
  Filled 2021-04-13: qty 10

## 2021-04-13 MED ORDER — SODIUM CHLORIDE 0.9% FLUSH
10.0000 mL | INTRAVENOUS | Status: DC | PRN
  Administered 2021-04-13: 10 mL
  Filled 2021-04-13: qty 10

## 2021-04-13 MED ORDER — LIDOCAINE-PRILOCAINE 2.5-2.5 % EX CREA
1.0000 "application " | TOPICAL_CREAM | Freq: Every day | CUTANEOUS | 9 refills | Status: DC | PRN
  Filled 2021-04-13: qty 30, 30d supply, fill #0

## 2021-04-13 MED ORDER — HYDROCODONE-ACETAMINOPHEN 5-325 MG PO TABS
1.0000 | ORAL_TABLET | Freq: Four times a day (QID) | ORAL | 0 refills | Status: DC | PRN
  Filled 2021-04-13: qty 60, 15d supply, fill #0

## 2021-04-13 MED ORDER — HEPARIN SOD (PORK) LOCK FLUSH 100 UNIT/ML IV SOLN
500.0000 [IU] | Freq: Once | INTRAVENOUS | Status: AC | PRN
  Administered 2021-04-13: 500 [IU]
  Filled 2021-04-13: qty 5

## 2021-04-13 NOTE — Assessment & Plan Note (Signed)
I have reviewed her prior CT imaging of the chest I recommend we proceed with repeat imaging study because she is symptomatic She is in agreement I will try to get CT scan of the chest performed next week

## 2021-04-13 NOTE — Assessment & Plan Note (Signed)
She is able to reduce the amount of pain medicine required She will continue pain medicine taper as tolerated I have refilled her prescription pain medicine today

## 2021-04-13 NOTE — Progress Notes (Signed)
[place] OFFICE PROGRESS NOTE  Patient Care Team: Hamrick, Lorin Mercy, MD as PCP - General (Family Medicine) Heath Lark, MD as Consulting Physician (Hematology and Oncology)  ASSESSMENT & PLAN:  Laryngeal squamous cell carcinoma ([facility]) I have reviewed documentation from [facility] She only had CT imaging of her neck performed which showed improved disease control but not on her chest She is symptomatic with shortness of breath when she lies down She is known to have mediastinal mass I recommend CT imaging of her chest next week for further evaluation and she is in agreement She will proceed with treatment as scheduled  Metastasis to lung [facility]) I have reviewed her prior CT imaging of the chest I recommend we proceed with repeat imaging study because she is symptomatic She is in agreement I will try to get CT scan of the chest performed next week  Cancer associated pain She is able to reduce the amount of pain medicine required She will continue pain medicine taper as tolerated I have refilled her prescription pain medicine today  Dysphagia Her dysphagia is not better She is able to swallow pills but not food For now, she will continue nutritional intake through feeding tube She will be seen by dietitian today  Orders Placed This Encounter  Procedures   CT CHEST W CONTRAST    Standing Status:   Future    Standing Expiration Date:   04/13/2022    Order Specific Question:   If indicated for the ordered procedure, I authorize the administration of contrast media per Radiology protocol    Answer:   Yes    Order Specific Question:   Preferred imaging location?    Answer:   [facility]    Order Specific Question:   Radiology Contrast Protocol - do NOT remove file path    Answer:   [email or web address]    All questions were answered. The patient knows to call the clinic with any problems, questions or concerns. The  total time spent in the appointment was 30 minutes encounter with patients including review of chart and various tests results, discussions about plan of care and coordination of care plan   Heath Lark, MD 04/13/2021 9:23 AM  INTERVAL HISTORY: Please see below for problem oriented charting. She returns with her son for further follow-up I have reviewed documentation from [facility] She had CT imaging of her neck done recently but not on her chest She found it difficult to lie down She has shortness of breath when she tried to lie down totally flat She has no worsening pain No recent infection, fever or chills   SUMMARY OF ONCOLOGIC HISTORY: Oncology History  Laryngeal squamous cell carcinoma ([facility])  12/02/2018 Imaging   CT neck: IMPRESSION: 1. Supraglottic laryngeal carcinoma extending across the anterior commissure and invading the prelaryngeal fat, left aryepiglottic fold and left aspect of the epiglottis. 2. Severe narrowing of the laryngeal airway. 3. Bilateral subcentimeter level 2A and 2A cervical lymph nodes.     12/02/2018 Imaging   CT  Chest:  IMPRESSION: No evidence of metastatic disease in the chest.    12/03/2018 Pathology Results   (Care Everywhere; [facility]) A: Larynx, supraglottis, biopsy - Atypical squamous proliferation with at least low grade dysplasia and abundant keratinization - No definite high grade dysplasia or invasive carcinoma represented (see comment)    12/18/2018 Pathology Results   (Care Everywhere; [facility]) A:  Neck, left, fine needle aspiration - Rare single atypical cells, no cells  diagnostic of malignancy - Lymph node material present  B:  Neck, right, fine needle aspiration - Non-diagnostic specimen - No lymph node material identified - Mixed inflammation and blood    12/31/2018 Pathology Results   (Care Everywhere; [facility])  A: Larynx, left supraglottic mass, biopsy - Atypical squamous proliferation with abundant  keratinization, suspicious for well-differentiated invasive squamous cell carcinoma  B: Larynx, left supraglottic mass, biopsy - Well-differentiated invasive squamous cell carcinoma with abundant keratinization (see comment)    01/18/2019 Initial Diagnosis   Laryngeal cancer ([place])    01/26/2019 Imaging   1. Locally advanced hypermetabolic laryngeal neoplasm centered in the left supraglottic space with bilateral glottic and epiglottic involvement and subglottic extension on the left. 2. Hypermetabolic bilateral level 3 neck nodal metastases. 3. Hypermetabolic high mediastinal nodal metastases between the trachea and upper thoracic esophagus. 4. Otherwise no distant hypermetabolic metastatic disease. 5. Nodular 7 mm focus at the left lung base, below PET resolution, recommend attention on follow-up chest CT in 3 months. 6. Chronic findings include: Aortic Atherosclerosis (ICD10-I70.0). Cholelithiasis.   02/08/2019 Cancer Staging   Staging form: Larynx - Supraglottis, AJCC 8th Edition - Clinical stage from 02/08/2019: Stage IVA (cT3, cN2c, cM0) - Signed by Eppie Gibson, MD on 02/08/2019    02/11/2019 - 04/07/2019 Chemotherapy   The patient weekly cisplatin for chemotherapy treatment.     07/20/2019 Pathology Results   PROCEDURE: Total glossectomy, total pharyngectomy, total laryngectomy, total thyroidectomy, and cervical esophagectomy TUMOR SITE: Larynx TUMOR LATERALITY: Not specified TUMOR FOCALITY: Unifocal TUMOR SIZE:    GREATEST DIMENSION: 6.9 cm    ADDITIONAL DIMENSIONS: 5.4 x 2.4 cm HISTOLOGIC TYPE: Squamous cell carcinoma, conventional (keratinizing) HISTOLOGIC GRADE:  G1-G2: Well to moderately differentiated TUMOR EXTENSION: Tumor involves the posterior tongue, pharynx, larynx, tracheal cartilage, hyoid bone, cervical esophagus, vagus nerve, carotid artery, deep cervical fascia, ans surrounding skeletal muscle and soft tissue MARGINS: Uninvolved by invasive tumor; Perineural  invasion is less than 7mm from the left soft tissue margin LYMPHOVASCULAR INVASION: Not identified PERINEURAL INVASION:  Present REGIONAL LYMPH NODES:     NUMBER OF LYMPH NODES INVOLVED: 2     NUMBER OF LYMPH NODES EXAMINED: 24     LATERALITY OF LYMPH NODES INVOLVED: Cannot determine     SIZE OF LARGEST METASTATIC DEPOSIT: 0.8 cm     EXTRANODAL EXTENSION: Not identified PATHOLOGIC STAGE CLASSIFICATION (pTNM, AJCC 8TH Ed): (r)(y) pT4b pN2a DISTANT METASTASIS (pM): ADDITIONAL PATHOLOGIC FINDINGS: Previous chemoradiation   11/04/2019 Imaging   PET: 1.  Hypermetabolic bilateral neck lesions as detailed above are concerning for disease recurrence. 2.  Compared to 07/08/2019 PET scan, interval development of subcentimeter bilateral upper lobe pulmonary nodules, size below PET resolution. These are concerning for metastasis. Consider diagnostic quality CT chest exam for further evaluation as per oncology treatment planning. 3.  Focal hypermetabolic uptake in right ventricular cavity of indeterminate significance. Recommend echocardiographic examination to rule out underlying thrombus. This finding was discussed with Dr. Conley Canal by Dr. Clementeen Graham and Dr. Marcello Moores on 11/04/2019 at approximately 3:00 PM via telephone. 4.  Ancillary CT findings as above.   11/05/2019 Imaging   Echocardiogram: Summary   1. The left ventricle is normal in size with normal wall thickness.   2. The left ventricular systolic function is normal, LVEF is visually estimated at 60-65%.   3. The right ventricle is normal in size, with normal systolic function.   4. There is an extrinsic compression of the RV and RA laterally at the AV groove by  a mass of 4.3 cm diameter. Unclear if the mass is invasive, although no pericardial effusion seen suggestive no invasion possibly. There is no evidence of hemodynamic compromise secondary to cardiac compression with normal IVC diameter.   12/06/2019 Pathology Results   A. LYMPH NODE,  LEFT SUBMANDIBULAR, BIOPSY:  - Squamous cell carcinoma.  - No distinct nodal tissue identified.    12/06/2019 Procedure   Successful ultrasound-guided core biopsy of centrally necrotic left submandibular lymph node. Of note, due to extensive prior postsurgical and post radiation changes, patient has very limited mobility in the neck and the biopsy was extremely challenging    12/16/2019 -  Chemotherapy   The patient had pembrolizumab for chemotherapy treatment.     03/08/2020 PET scan   1. Diminished size of masses in the neck as described. Persistent increased metabolic activity particularly in the LEFT neck. Some of this diminished size on the LEFT could be due to decompression of necrotic material. Correlate with any symptoms of inflammation or infection in this area as well given the paucity of residual soft tissue associated with the above abnormality, overall findings are compatible with residual disease in this location and there is no significant surrounding stranding to indicate inflammation that would correlate with the degree of FDG uptake remaining. 2. Diminished activity in the juxta cardiac mass with peripheral activity suggesting interval necrosis, on the prior study this was uniformly hypermetabolic. There is still considerable FDG uptake. 3. No signs of disease in the abdomen or pelvis. 4. 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 Correlate with any new pain or symptoms in this area with further imaging as warranted. No discrete mass or bone destruction on today's study.   06/08/2020 Imaging   CT neck 1. Status post laryngectomy and neck dissection without lymphadenopathy or mass lesion. 2. Short segment severe stenosis of the left internal carotid artery adjacent to a surgical clip.     06/08/2020 Imaging   CT chest 1. There is a large, hypodense lesion within, or very closely abutting and likely intruding upon the right ventricle, as seen on prior examination and  approximately 4.6 x 4.3 cm, poorly evaluated due to cardiac motion. Findings are consistent with metastatic lesion and/or thrombus and poorly assessed by ungated CT. Consider echocardiography and cardiac MRI for further assessment.   2. Multiple small pulmonary nodules in the bilateral lung apices are unchanged. Attention on follow-up.   3.  No evidence of new metastatic disease in the chest.   4.  Aortic Atherosclerosis (ICD10-I70.0).     09/07/2020 Imaging   1. Mild decrease in size of juxta cardiac metastatic involving the right ventricle. 2. Small pulmonary nodules are unchanged from previous exam. 3. Aortic atherosclerosis. 4. Gallstone.     12/14/2020 Procedure   Successful exchange and up size from 20 French pull-through gastrostomy to 22 French balloon retention gastrostomy.   03/30/2021 Imaging   Ct neck 1.  In comparison with PET/CT from February 2021, marked interval decrease in size of soft tissue mass along the angle of the left mandible consistent with a positive treatment response. However, there are new destructive bony changes along the lingual surface of the mandible in this region with appearance of soft tissue infiltrating the marrow cavity. While concerning for neoplastic involvement, changes could also represent other nonspecific osteonecrosis including infection or sequela of prior radiation treatment. Recommend correlation with direct visual inspection. Further evaluation with MRI may be of value.  2.  Centrally necrotic 1.3 cm right level IIa lymph  node corresponding with previously described hypermetabolic lesion, decreased in size from prior. No suspicious new or enlarged lymph nodes in the neck.  3.  Enlarged right lower paratracheal lymph node suspected in the mediastinum, incompletely imaged. Consider dedicated CT imaging of the chest.  4.  Scattered pulmonary nodules in the imaged lung apices, the largest a 9 mm lesion in the right upper lobe. Recommend comparison  with outside imaging ofthe chest if available.     REVIEW OF SYSTEMS:   Constitutional: Denies fevers, chills or abnormal weight loss Eyes: Denies blurriness of vision Ears, nose, mouth, throat, and face: Denies mucositis or sore throat Cardiovascular: Denies palpitation, chest discomfort or lower extremity swelling Gastrointestinal:  Denies nausea, heartburn or change in bowel habits Skin: Denies abnormal skin rashes Lymphatics: Denies new lymphadenopathy or easy bruising Neurological:Denies numbness, tingling or new weaknesses Behavioral/Psych: Mood is stable, no new changes  All other systems were reviewed with the patient and are negative.  I have reviewed the past medical history, past surgical history, social history and family history with the patient and they are unchanged from previous note.  ALLERGIES:  has No Known Allergies.  MEDICATIONS:  Current Outpatient Medications  Medication Sig Dispense Refill   lidocaine-prilocaine (EMLA) cream Apply 1 application topically daily as needed. 30 g 9   chlorhexidine (PERIDEX) 0.12 % solution Use as directed 15 mLs in the mouth or throat 2 (two) times daily. Rinse with 15 mls twice daily for 30 seconds. Use after breakfast and at bedtime. Spit out excess. Do not swallow. 473 mL 2   Cholecalciferol (VITAMIN D3) 125 MCG (5000 UT) CAPS Take 5,000 Units by mouth daily.     HYDROcodone-acetaminophen (NORCO/VICODIN) 5-325 MG tablet TAKE 1 TABLET BY MOUTH EVERY 6 HOURS AS NEEDED FOR MODERATE PAIN 60 tablet 0   levothyroxine (SYNTHROID) 137 MCG tablet Take 137 mcg by mouth daily before breakfast.     Nutritional Supplements (KATE FARMS PEPTIDE 1.5) LIQD Take 4 Bottles by mouth daily. 325 mL 11   nystatin (MYCOSTATIN) 100000 UNIT/ML suspension Take 5 mLs by mouth 2 (two) times daily.     ondansetron (ZOFRAN) 8 MG tablet Take 1 tablet (8 mg total) by mouth 2 (two) times daily as needed (Nausea or vomiting). (Patient taking differently: Take 8 mg  by mouth 2 (two) times daily as needed for nausea or vomiting (Nausea or vomiting).) 30 tablet 1   oxymetazoline (AFRIN) 0.05 % nasal spray Place 3 sprays into both nostrils 2 (two) times daily as needed (Bleeding from stoma). 30 mL 0   prochlorperazine (COMPAZINE) 10 MG tablet Take 1 tablet (10 mg total) by mouth every 6 (six) hours as needed (Nausea or vomiting). (Patient taking differently: Take 10 mg by mouth every 6 (six) hours as needed for nausea or vomiting (Nausea or vomiting).) 30 tablet 1   No current facility-administered medications for this visit.    PHYSICAL EXAMINATION: ECOG PERFORMANCE STATUS: 1 - Symptomatic but completely ambulatory  Vitals:   04/13/21 0903  BP: 113/74  Pulse: 82  Resp: 18  Temp: 97.6 F (36.4 C)  SpO2: 100%   Filed Weights   04/13/21 0903  Weight: 143 lb (64.9 kg)    GENERAL:alert, no distress and comfortable SKIN: skin color, texture, turgor are normal, no rashes or significant lesions EYES: normal, Conjunctiva are pink and non-injected, sclera clear OROPHARYNX:no exudate, no erythema and lips, buccal mucosa, and tongue normal  NECK: Tracheostomy in situ LYMPH:  no palpable lymphadenopathy in the cervical,  axillary or inguinal LUNGS: clear to auscultation and percussion with normal breathing effort HEART: regular rate & rhythm and no murmurs and no lower extremity edema ABDOMEN:abdomen soft, non-tender and normal bowel sounds Musculoskeletal:no cyanosis of digits and no clubbing  NEURO: alert & oriented x 3 no focal motor/sensory deficits  LABORATORY DATA:  I have reviewed the data as listed    Component Value Date/Time   NA 139 03/23/2021 0829   K 3.9 03/23/2021 0829   CL 102 03/23/2021 0829   CO2 28 03/23/2021 0829   GLUCOSE 100 (H) 03/23/2021 0829   BUN 22 03/23/2021 0829   CREATININE 0.63 03/23/2021 0829   CREATININE 0.60 02/17/2020 1128   CALCIUM 9.3 03/23/2021 0829   PROT 7.7 03/23/2021 0829   ALBUMIN 4.4 03/23/2021 0829    AST 22 03/23/2021 0829   AST 22 02/17/2020 1128   ALT 19 03/23/2021 0829   ALT 24 02/17/2020 1128   ALKPHOS 59 03/23/2021 0829   BILITOT 0.4 03/23/2021 0829   BILITOT 0.5 02/17/2020 1128   GFRNONAA >60 03/23/2021 0829   GFRNONAA >60 02/17/2020 1128   GFRAA >60 06/08/2020 1303   GFRAA >60 02/17/2020 1128    No results found for: SPEP, UPEP  Lab Results  Component Value Date   WBC 4.2 04/13/2021   NEUTROABS 2.0 04/13/2021   HGB 13.2 04/13/2021   HCT 39.4 04/13/2021   MCV 86.8 04/13/2021   PLT 226 04/13/2021      Chemistry      Component Value Date/Time   NA 139 03/23/2021 0829   K 3.9 03/23/2021 0829   CL 102 03/23/2021 0829   CO2 28 03/23/2021 0829   BUN 22 03/23/2021 0829   CREATININE 0.63 03/23/2021 0829   CREATININE 0.60 02/17/2020 1128      Component Value Date/Time   CALCIUM 9.3 03/23/2021 0829   ALKPHOS 59 03/23/2021 0829   AST 22 03/23/2021 0829   AST 22 02/17/2020 1128   ALT 19 03/23/2021 0829   ALT 24 02/17/2020 1128   BILITOT 0.4 03/23/2021 0829   BILITOT 0.5 02/17/2020 1128       RADIOGRAPHIC STUDIES: I have reviewed documented result from [facility] and reviewed her prior CT imaging with the patient and her son I have personally reviewed the radiological images as listed and agreed with the findings in the report.

## 2021-04-13 NOTE — Assessment & Plan Note (Signed)
I have reviewed documentation from [facility] She only had CT imaging of her neck performed which showed improved disease control but not on her chest She is symptomatic with shortness of breath when she lies down She is known to have mediastinal mass I recommend CT imaging of her chest next week for further evaluation and she is in agreement She will proceed with treatment as scheduled

## 2021-04-13 NOTE — Patient Instructions (Signed)
Pembrolizumab injection What is this medication? PEMBROLIZUMAB (pem broe liz ue mab) is a monoclonal antibody. It is used totreat certain types of cancer. This medicine may be used for other purposes; ask your health care provider orpharmacist if you have questions. COMMON BRAND NAME(S): Keytruda What should I tell my care team before I take this medication? They need to know if you have any of these conditions: autoimmune diseases like Crohn's disease, ulcerative colitis, or lupus have had or planning to have an allogeneic stem cell transplant (uses someone else's stem cells) history of organ transplant history of chest radiation nervous system problems like myasthenia gravis or Guillain-Barre syndrome an unusual or allergic reaction to pembrolizumab, other medicines, foods, dyes, or preservatives pregnant or trying to get pregnant breast-feeding How should I use this medication? This medicine is for infusion into a vein. It is given by a health careprofessional in a hospital or clinic setting. A special MedGuide will be given to you before each treatment. Be sure to readthis information carefully each time. Talk to your pediatrician regarding the use of this medicine in children. While this drug may be prescribed for children as young as 6 months for selectedconditions, precautions do apply. Overdosage: If you think you have taken too much of this medicine contact apoison control center or emergency room at once. NOTE: This medicine is only for you. Do not share this medicine with others. What if I miss a dose? It is important not to miss your dose. Call your doctor or health careprofessional if you are unable to keep an appointment. What may interact with this medication? Interactions have not been studied. This list may not describe all possible interactions. Give your health care provider a list of all the medicines, herbs, non-prescription drugs, or dietary supplements you use. Also  tell them if you smoke, drink alcohol, or use illegaldrugs. Some items may interact with your medicine. What should I watch for while using this medication? Your condition will be monitored carefully while you are receiving thismedicine. You may need blood work done while you are taking this medicine. Do not become pregnant while taking this medicine or for 4 months after stopping it. Women should inform their doctor if they wish to become pregnant or think they might be pregnant. There is a potential for serious side effects to an unborn child. Talk to your health care professional or pharmacist for more information. Do not breast-feed an infant while taking this medicine orfor 4 months after the last dose. What side effects may I notice from receiving this medication? Side effects that you should report to your doctor or health care professionalas soon as possible: allergic reactions like skin rash, itching or hives, swelling of the face, lips, or tongue bloody or black, tarry breathing problems changes in vision chest pain chills confusion constipation cough diarrhea dizziness or feeling faint or lightheaded fast or irregular heartbeat fever flushing joint pain low blood counts - this medicine may decrease the number of white blood cells, red blood cells and platelets. You may be at increased risk for infections and bleeding. muscle pain muscle weakness pain, tingling, numbness in the hands or feet persistent headache redness, blistering, peeling or loosening of the skin, including inside the mouth signs and symptoms of high blood sugar such as dizziness; dry mouth; dry skin; fruity breath; nausea; stomach pain; increased hunger or thirst; increased urination signs and symptoms of kidney injury like trouble passing urine or change in the amount of urine signs and  symptoms of liver injury like dark urine, light-colored stools, loss of appetite, nausea, right upper belly pain,  yellowing of the eyes or skin sweating swollen lymph nodes weight loss Side effects that usually do not require medical attention (report to yourdoctor or health care professional if they continue or are bothersome): decreased appetite hair loss tiredness This list may not describe all possible side effects. Call your doctor for medical advice about side effects. You may report side effects to FDA at1-800-FDA-1088. Where should I keep my medication? This drug is given in a hospital or clinic and will not be stored at home. NOTE: This sheet is a summary. It may not cover all possible information. If you have questions about this medicine, talk to your doctor, pharmacist, orhealth care provider.  2022 Elsevier/Gold Standard (2019-08-11 21:44:53)

## 2021-04-13 NOTE — Assessment & Plan Note (Signed)
Her dysphagia is not better She is able to swallow pills but not food For now, she will continue nutritional intake through feeding tube She will be seen by dietitian today

## 2021-04-16 ENCOUNTER — Telehealth: Payer: Self-pay

## 2021-04-16 DIAGNOSIS — Z87891 Personal history of nicotine dependence: Secondary | ICD-10-CM | POA: Diagnosis not present

## 2021-04-16 DIAGNOSIS — R1314 Dysphagia, pharyngoesophageal phase: Secondary | ICD-10-CM | POA: Diagnosis not present

## 2021-04-16 DIAGNOSIS — C329 Malignant neoplasm of larynx, unspecified: Secondary | ICD-10-CM | POA: Diagnosis not present

## 2021-04-16 NOTE — Telephone Encounter (Signed)
Notified [name] [name] of prior authorization approval for Lidocaine-Prilocaine cream. Authorization is approved through 07/15/2021.

## 2021-04-16 NOTE — Telephone Encounter (Signed)
-----   Message from Heath Lark, MD sent at 04/16/2021  7:47 AM EDT ----- I can see her at 1140 on Friday to review CT, please schedule 30 mins

## 2021-04-16 NOTE — Telephone Encounter (Signed)
Called and left a message for [facility] appt scheduled with Dr. Alvy Bimler on 7/29 at 1140. Ask her to call the office for questions.

## 2021-04-18 DIAGNOSIS — C321 Malignant neoplasm of supraglottis: Secondary | ICD-10-CM | POA: Diagnosis not present

## 2021-04-18 DIAGNOSIS — R633 Feeding difficulties, unspecified: Secondary | ICD-10-CM | POA: Diagnosis not present

## 2021-04-19 ENCOUNTER — Encounter (HOSPITAL_BASED_OUTPATIENT_CLINIC_OR_DEPARTMENT_OTHER): Payer: Self-pay

## 2021-04-19 ENCOUNTER — Other Ambulatory Visit: Payer: Self-pay

## 2021-04-19 ENCOUNTER — Ambulatory Visit (HOSPITAL_BASED_OUTPATIENT_CLINIC_OR_DEPARTMENT_OTHER)
Admission: RE | Admit: 2021-04-19 | Discharge: 2021-04-19 | Disposition: A | Discharge status: Home / Self Care | Payer: Medicare HMO | Source: Ambulatory Visit | Attending: Hematology and Oncology | Admitting: Hematology and Oncology

## 2021-04-19 ENCOUNTER — Encounter: Payer: Self-pay | Admitting: Hematology and Oncology

## 2021-04-19 DIAGNOSIS — R911 Solitary pulmonary nodule: Secondary | ICD-10-CM | POA: Diagnosis not present

## 2021-04-19 DIAGNOSIS — R918 Other nonspecific abnormal finding of lung field: Secondary | ICD-10-CM | POA: Diagnosis not present

## 2021-04-19 DIAGNOSIS — C7800 Secondary malignant neoplasm of unspecified lung: Secondary | ICD-10-CM | POA: Diagnosis not present

## 2021-04-19 DIAGNOSIS — C329 Malignant neoplasm of larynx, unspecified: Secondary | ICD-10-CM | POA: Diagnosis not present

## 2021-04-19 DIAGNOSIS — I700 Atherosclerosis of aorta: Secondary | ICD-10-CM | POA: Diagnosis not present

## 2021-04-19 IMAGING — CT CT CHEST W/ CM
2 of 4 series · 15 of 36 positions shown, 18 images · IV contrast (APPLIED)
Comparison: [DATE]

CLINICAL DATA: Monitor lung metastatic disease, history of
laryngeal cancer and tracheostomy

EXAM:
CT CHEST WITH CONTRAST
TECHNIQUE: Multidetector CT imaging of the chest was performed during
intravenous contrast administration.
CONTRAST:  75mL OMNIPAQUE IOHEXOL 300 MG/ML  SOLN

[Series 2: routine chest with · axial · 0.72mm/px · z∈[+1416,+1654]mm · 12 of 141 slices shown, 15 images]
[im 11/141  mediastinal]
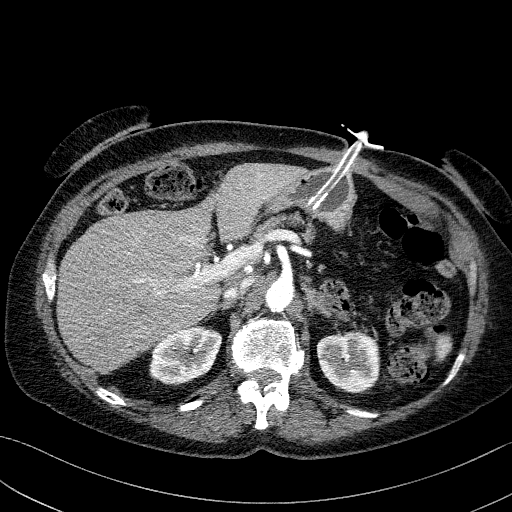
[im 11/141  lung]
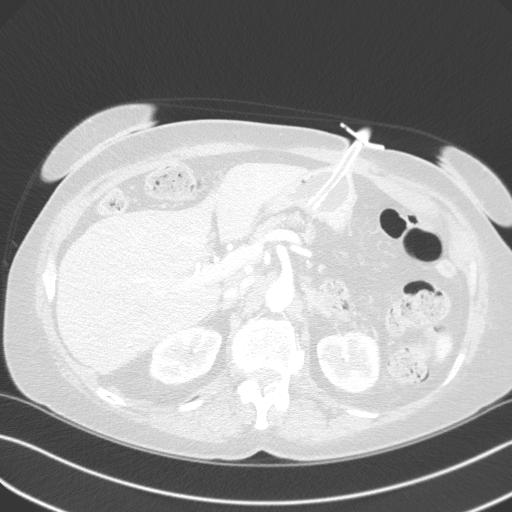
[im 22/141  lung]
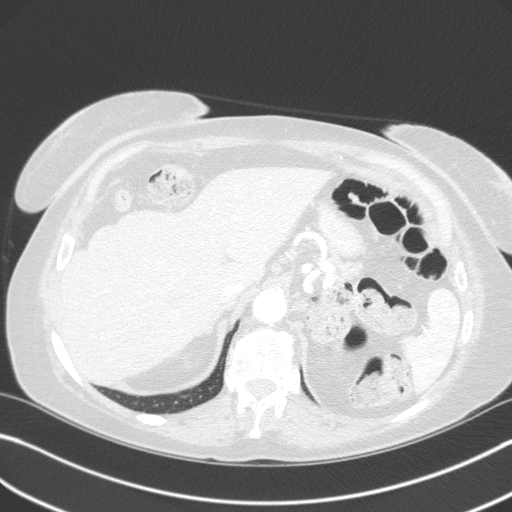
[im 33/141  lung]
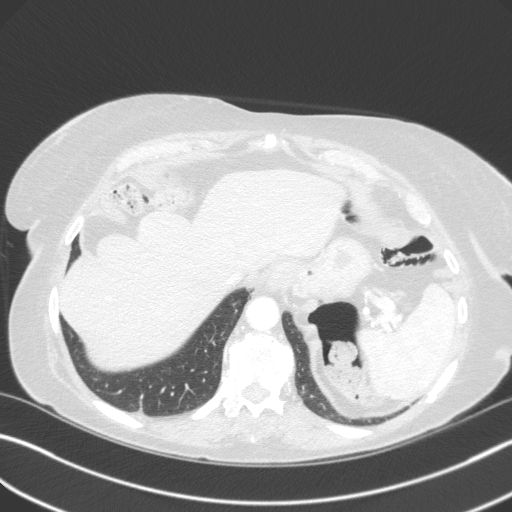
[im 44/141  lung]
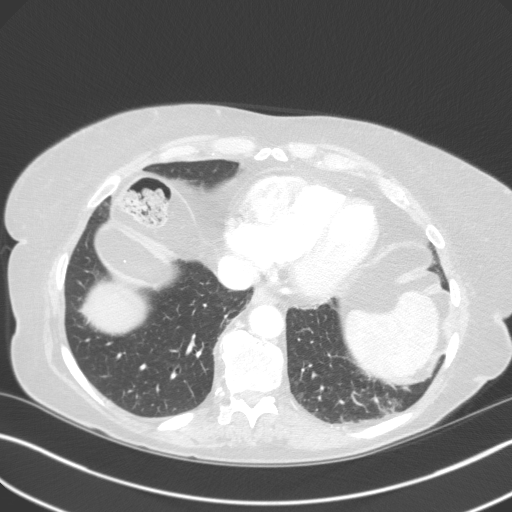
[im 54/141  mediastinal]
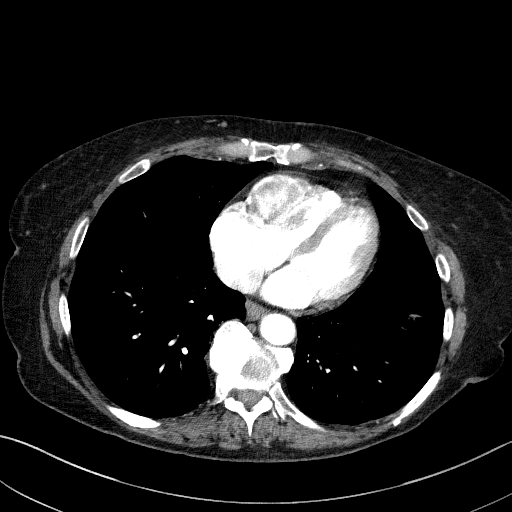
[im 54/141  lung]
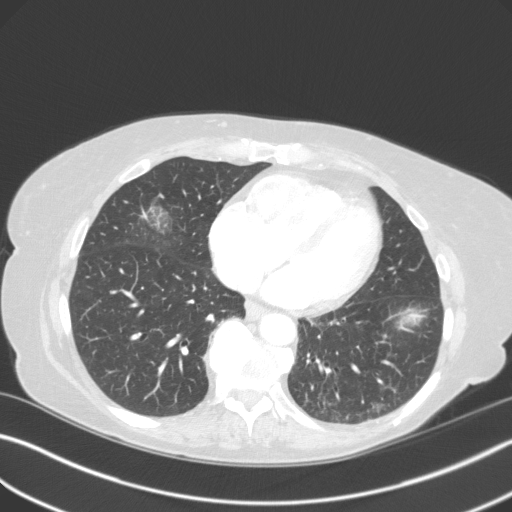
[im 65/141  lung]
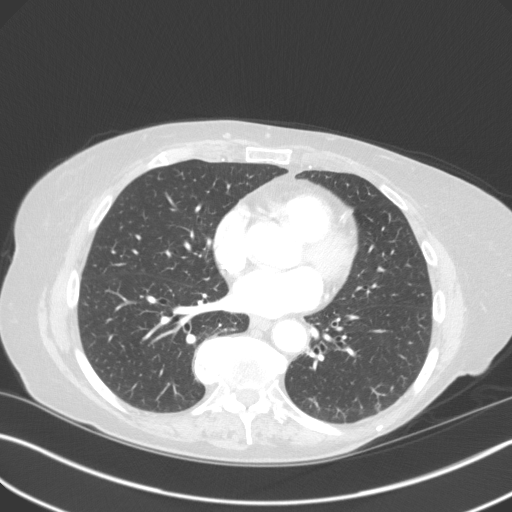
[im 76/141  lung]
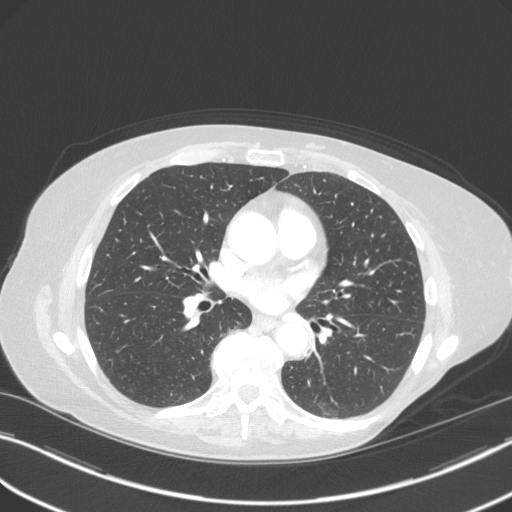
[im 87/141  lung]
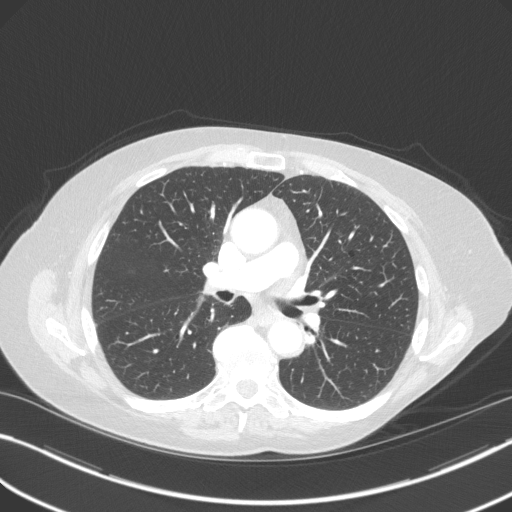
[im 97/141  mediastinal]
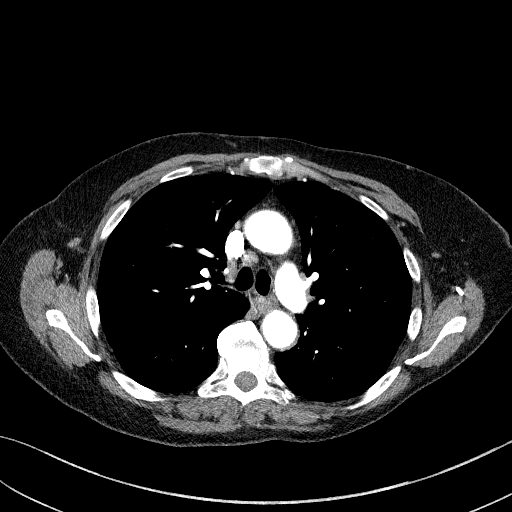
[im 97/141  lung]
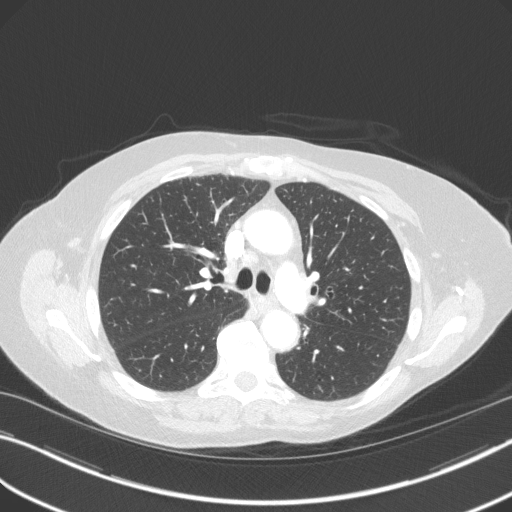
[im 108/141  lung]
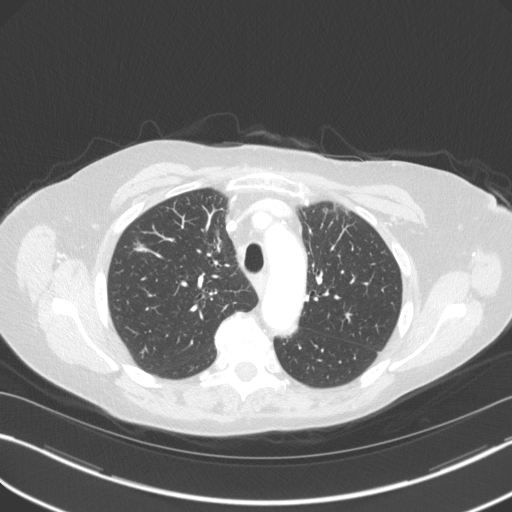
[im 119/141  lung]
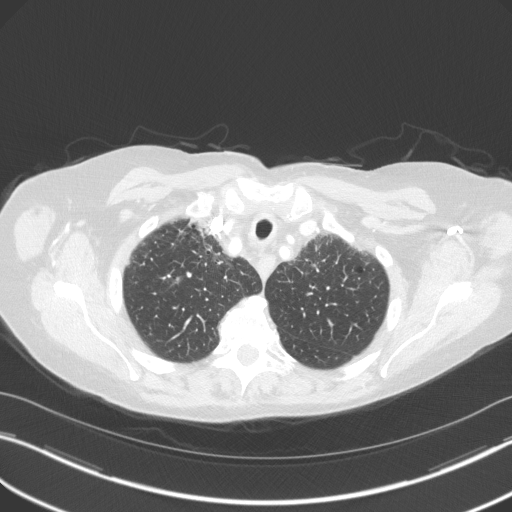
[im 130/141  lung]
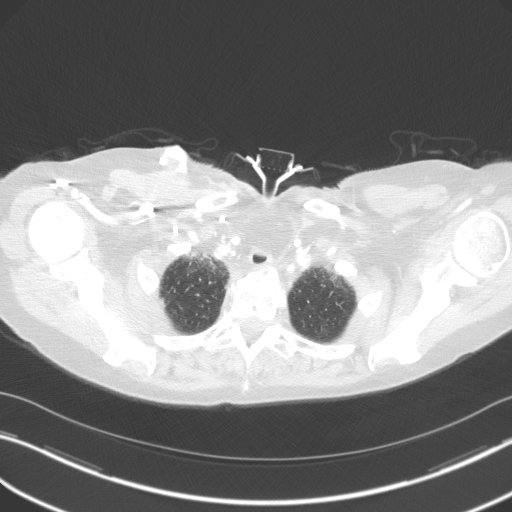

[Series 5: coronal · coronal · 0.57mm/px · 3 of 135 slices shown]
[im 27/135  lung]
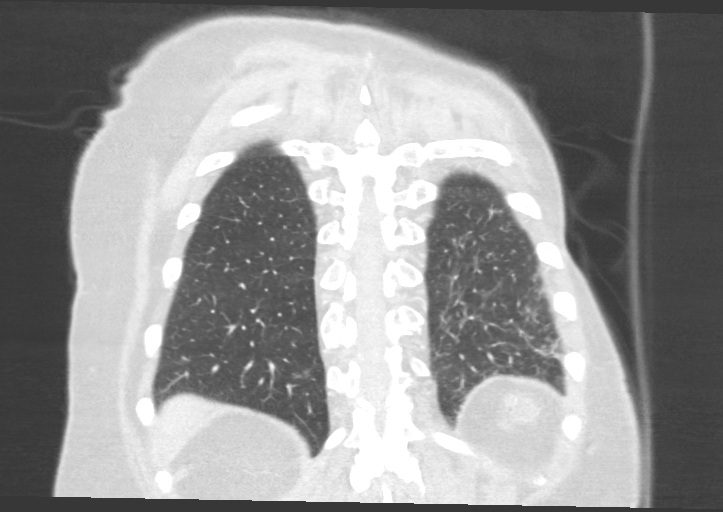
[im 54/135  lung]
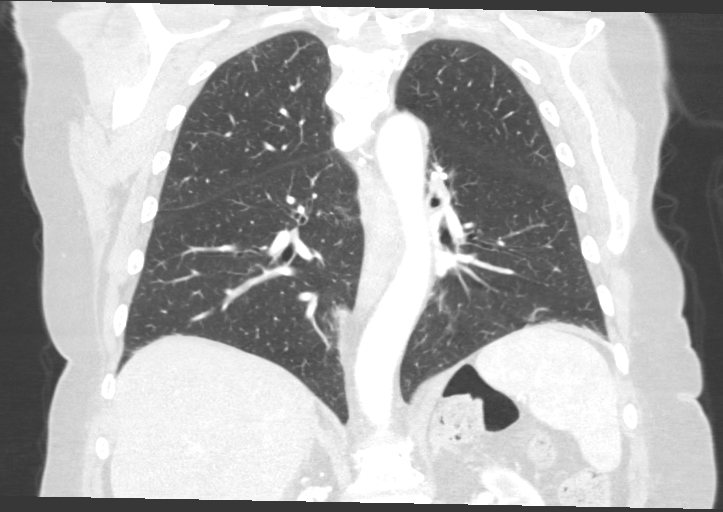
[im 81/135  lung]
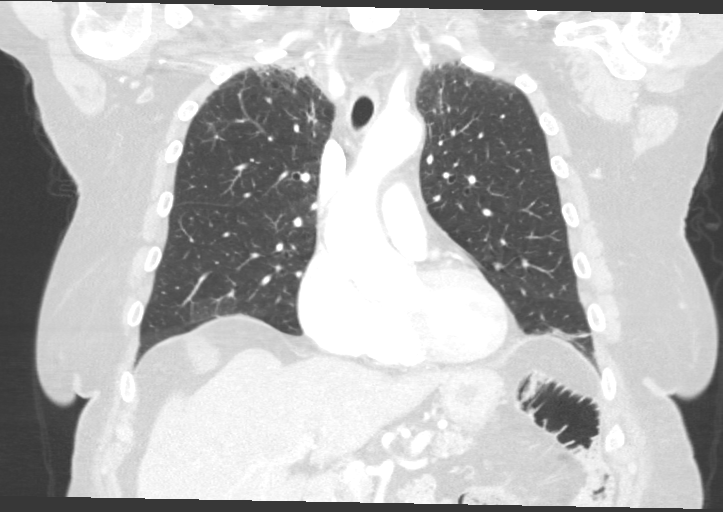

[15 of 36 positions shown; findings below may reference images not displayed]

FINDINGS: Cardiovascular: Right chest port catheter. Aortic atherosclerosis.
Normal heart size. No significant change in a large, heterogeneously
hypodense mass abutting, and likely intruding into the right
ventricle, measuring 4.5 x 4.2 cm (series 2, image 89). No
pericardial effusion.

Mediastinum/Nodes: No enlarged mediastinal, hilar, or axillary lymph
nodes. Status post laryngectomy and tracheostomy apparent thyroid
gland and esophagus demonstrate no significant findings.

Lungs/Pleura: Tracheostomy. Minimal biapical radiation fibrosis.
There is a new pulmonary nodule of the right upper lobe measuring 8
mm (series 4, image 32). Other small nodules are stable, for example
a 3 mm nodule of the right apex (series 4, image 15). Minimal
scarring and or partial atelectasis of the lung bases. No pleural
effusion or pneumothorax.

Upper Abdomen: No acute abnormality.  Percutaneous gastrostomy.

Musculoskeletal: No chest wall mass or suspicious bone lesions
identified.
IMPRESSION: 1. No significant change in a large, heterogeneously hypodense mass
abutting, and likely intruding into the right ventricle, measuring
4.5 x 4.2 cm, consistent with metastatic lesion.
2. New pulmonary nodule of the right upper lobe measuring 8 mm,
highly concerning for pulmonary metastatic disease. Other small
nodules are stable and presumably benign and incidental. Attention
on follow-up.
3. Status post laryngectomy and tracheostomy.

Aortic Atherosclerosis ([YT]-[YT]).

## 2021-04-19 MED ORDER — IOHEXOL 300 MG/ML  SOLN
75.0000 mL | Freq: Once | INTRAMUSCULAR | Status: AC | PRN
  Administered 2021-04-19: 75 mL via INTRAVENOUS

## 2021-04-20 ENCOUNTER — Encounter: Payer: Self-pay | Admitting: Hematology and Oncology

## 2021-04-20 ENCOUNTER — Telehealth: Payer: Self-pay

## 2021-04-20 ENCOUNTER — Inpatient Hospital Stay (HOSPITAL_BASED_OUTPATIENT_CLINIC_OR_DEPARTMENT_OTHER): Payer: Medicare HMO | Admitting: Hematology and Oncology

## 2021-04-20 DIAGNOSIS — R1310 Dysphagia, unspecified: Secondary | ICD-10-CM | POA: Diagnosis not present

## 2021-04-20 DIAGNOSIS — Z930 Tracheostomy status: Secondary | ICD-10-CM | POA: Diagnosis not present

## 2021-04-20 DIAGNOSIS — R0609 Other forms of dyspnea: Secondary | ICD-10-CM

## 2021-04-20 DIAGNOSIS — C329 Malignant neoplasm of larynx, unspecified: Secondary | ICD-10-CM | POA: Diagnosis not present

## 2021-04-20 DIAGNOSIS — C321 Malignant neoplasm of supraglottis: Secondary | ICD-10-CM | POA: Diagnosis not present

## 2021-04-20 DIAGNOSIS — R0600 Dyspnea, unspecified: Secondary | ICD-10-CM | POA: Insufficient documentation

## 2021-04-20 DIAGNOSIS — Z923 Personal history of irradiation: Secondary | ICD-10-CM | POA: Diagnosis not present

## 2021-04-20 DIAGNOSIS — Z87891 Personal history of nicotine dependence: Secondary | ICD-10-CM | POA: Diagnosis not present

## 2021-04-20 DIAGNOSIS — C7800 Secondary malignant neoplasm of unspecified lung: Secondary | ICD-10-CM

## 2021-04-20 DIAGNOSIS — G893 Neoplasm related pain (acute) (chronic): Secondary | ICD-10-CM | POA: Diagnosis not present

## 2021-04-20 DIAGNOSIS — Z79899 Other long term (current) drug therapy: Secondary | ICD-10-CM | POA: Diagnosis not present

## 2021-04-20 DIAGNOSIS — Z5112 Encounter for antineoplastic immunotherapy: Secondary | ICD-10-CM | POA: Diagnosis not present

## 2021-04-20 DIAGNOSIS — E039 Hypothyroidism, unspecified: Secondary | ICD-10-CM | POA: Diagnosis not present

## 2021-04-20 NOTE — Assessment & Plan Note (Signed)
She has new lung nodule on the right upper lobe, concern for metastatic disease She is not symptomatic from that perspective Recommend CT imaging in 3 months, due in October

## 2021-04-20 NOTE — Assessment & Plan Note (Signed)
She has dyspnea on exertion and I am wondering whether the cardiac mass could have contributed to changes in her heart function I recommend echocardiogram for evaluation and she is in agreement

## 2021-04-20 NOTE — Telephone Encounter (Signed)
-----   Message from Heath Lark, MD sent at 04/20/2021 11:51 AM EDT ----- Can you order ECHo to be done around 8/10? I placed order

## 2021-04-20 NOTE — Telephone Encounter (Signed)
Called and given appt for echo at [facility] on 8/11 at 10 am, arrive at [place]. Cindy verbalized understanding.

## 2021-04-20 NOTE — Assessment & Plan Note (Signed)
BelowI have reviewed her CT imaging with patient and family She has new lung nodule To pericardiac mass is stable in size but remains unknown in etiology Recommend echocardiogram for evaluation and she is in agreement I will see her in 2 weeks for further follow-up

## 2021-04-20 NOTE — Progress Notes (Signed)
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[place]) BelowI have reviewed her CT imaging with patient and family She has new lung nodule To pericardiac mass is stable in size but remains unknown in etiology Recommend echocardiogram for evaluation and she is in agreement I will see her in 2 weeks for further follow-up  Dyspnea on exertion She has dyspnea on exertion and I am wondering whether the cardiac mass could have contributed to changes in her heart function I recommend echocardiogram for evaluation and she is in agreement  Metastasis to lung [facility]) She has new lung nodule on the right upper lobe, concern for metastatic disease She is not symptomatic from that perspective Recommend CT imaging in 3 months, due in October  Orders Placed This Encounter  Procedures   ECHOCARDIOGRAM COMPLETE    Standing Status:   Future    Standing Expiration Date:   04/20/2022    Order Specific Question:   Where should this test be performed    Answer:   [place]    Order Specific Question:   Perflutren DEFINITY (image enhancing agent) should be administered unless hypersensitivity or allergy exist    Answer:   Administer Perflutren    Order Specific Question:   Is a special reader required? (athlete or structural heart)    Answer:   Yes    Order Specific Question:   Please indicate the type of study to be read    Answer:   Structural heart    Order Specific Question:   Does this study need to be read by the Structural team/Level 3 readers?    Answer:   Yes - Echo_Structural_Heart    Order Specific Question:   Reason for exam-Echo    Answer:   Chemo  Z09    Order Specific Question:   Other Comments    Answer:   she has pericardiac mass on CT, ?metastatic disease or other etiology    All  questions were answered. The patient knows to call the clinic with any problems, questions or concerns. The total time spent in the appointment was 20 minutes encounter with patients including review of chart and various tests results, discussions about plan of care and coordination of care plan   Heath Lark, MD 04/20/2021 11:50 AM  INTERVAL HISTORY: Please see below for problem oriented charting. 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 Supraglottic laryngeal carcinoma extending across the anterior commissure and invading the prelaryngeal fat, left aryepiglottic fold and left aspect of the epiglottis. 2. Severe narrowing of the laryngeal airway. 3. Bilateral subcentimeter level 2A and 2A cervical lymph nodes.     12/02/2018 Imaging   CT  Chest:  IMPRESSION: No evidence of metastatic disease in the chest.    12/03/2018 Pathology Results   (Care Everywhere; [facility]) A: Larynx, supraglottis, biopsy - Atypical squamous proliferation with at least low grade dysplasia and abundant keratinization - No definite high grade dysplasia or invasive carcinoma represented (see comment)    12/18/2018 Pathology Results   (Care Everywhere; [facility]) A:  Neck, left, fine  needle aspiration - Rare single atypical cells, no cells diagnostic of malignancy - Lymph node material present  B:  Neck, right, fine needle aspiration - Non-diagnostic specimen - No lymph node material identified - Mixed inflammation and blood    12/31/2018 Pathology Results   (Care Everywhere; [facility])  A: Larynx, left supraglottic mass, biopsy - Atypical squamous proliferation with abundant keratinization, suspicious for well-differentiated  invasive squamous cell carcinoma  B: Larynx, left supraglottic mass, biopsy - Well-differentiated invasive squamous cell carcinoma with abundant keratinization (see comment)    01/18/2019 Initial Diagnosis   Laryngeal cancer ([place])    01/26/2019 Imaging   1. Locally advanced hypermetabolic laryngeal neoplasm centered in the left supraglottic space with bilateral glottic and epiglottic involvement and subglottic extension on the left. 2. Hypermetabolic bilateral level 3 neck nodal metastases. 3. Hypermetabolic high mediastinal nodal metastases between the trachea and upper thoracic esophagus. 4. Otherwise no distant hypermetabolic metastatic disease. 5. Nodular 7 mm focus at the left lung base, below PET resolution, recommend attention on follow-up chest CT in 3 months. 6. Chronic findings include: Aortic Atherosclerosis (ICD10-I70.0). Cholelithiasis.   02/08/2019 Cancer Staging   Staging form: Larynx - Supraglottis, AJCC 8th Edition - Clinical stage from 02/08/2019: Stage IVA (cT3, cN2c, cM0) - Signed by Eppie Gibson, MD on 02/08/2019    02/11/2019 - 04/07/2019 Chemotherapy   The patient weekly cisplatin for chemotherapy treatment.     07/20/2019 Pathology Results   PROCEDURE: Total glossectomy, total pharyngectomy, total laryngectomy, total thyroidectomy, and cervical esophagectomy TUMOR SITE: Larynx TUMOR LATERALITY: Not specified TUMOR FOCALITY: Unifocal TUMOR SIZE:    GREATEST DIMENSION: 6.9 cm    ADDITIONAL DIMENSIONS: 5.4 x 2.4 cm HISTOLOGIC TYPE: Squamous cell carcinoma, conventional (keratinizing) HISTOLOGIC GRADE:  G1-G2: Well to moderately differentiated TUMOR EXTENSION: Tumor involves the posterior tongue, pharynx, larynx, tracheal cartilage, hyoid bone, cervical esophagus, vagus nerve, carotid artery, deep cervical fascia, ans surrounding skeletal muscle and soft tissue MARGINS: Uninvolved by invasive tumor; Perineural invasion is less than 47mm from the left soft  tissue margin LYMPHOVASCULAR INVASION: Not identified PERINEURAL INVASION:  Present REGIONAL LYMPH NODES:     NUMBER OF LYMPH NODES INVOLVED: 2     NUMBER OF LYMPH NODES EXAMINED: 24     LATERALITY OF LYMPH NODES INVOLVED: Cannot determine     SIZE OF LARGEST METASTATIC DEPOSIT: 0.8 cm     EXTRANODAL EXTENSION: Not identified PATHOLOGIC STAGE CLASSIFICATION (pTNM, AJCC 8TH Ed): (r)(y) pT4b pN2a DISTANT METASTASIS (pM): ADDITIONAL PATHOLOGIC FINDINGS: Previous chemoradiation   11/04/2019 Imaging   PET: 1.  Hypermetabolic bilateral neck lesions as detailed above are concerning for disease recurrence. 2.  Compared to 07/08/2019 PET scan, interval development of subcentimeter bilateral upper lobe pulmonary nodules, size below PET resolution. These are concerning for metastasis. Consider diagnostic quality CT chest exam for further evaluation as per oncology treatment planning. 3.  Focal hypermetabolic uptake in right ventricular cavity of indeterminate significance. Recommend echocardiographic examination to rule out underlying thrombus. This finding was discussed with Dr. Conley Canal by Dr. Clementeen Graham and Dr. Marcello Moores on 11/04/2019 at approximately 3:00 PM via telephone. 4.  Ancillary CT findings as above.   11/05/2019 Imaging   Echocardiogram: Summary   1. The left ventricle is normal in size with normal wall thickness.   2. The left ventricular systolic function is normal, LVEF is visually estimated at 60-65%.   3. The right ventricle is normal in size, with normal systolic function.   4. There is an extrinsic compression of the  RV and RA laterally at the AV groove by a mass of 4.3 cm diameter. Unclear if the mass is invasive, although no pericardial effusion seen suggestive no invasion possibly. There is no evidence of hemodynamic compromise secondary to cardiac compression with normal IVC diameter.   12/06/2019 Pathology Results   A. LYMPH NODE, LEFT SUBMANDIBULAR, BIOPSY:  - Squamous cell  carcinoma.  - No distinct nodal tissue identified.    12/06/2019 Procedure   Successful ultrasound-guided core biopsy of centrally necrotic left submandibular lymph node. Of note, due to extensive prior postsurgical and post radiation changes, patient has very limited mobility in the neck and the biopsy was extremely challenging    12/16/2019 -  Chemotherapy   The patient had pembrolizumab for chemotherapy treatment.     03/08/2020 PET scan   1. Diminished size of masses in the neck as described. Persistent increased metabolic activity particularly in the LEFT neck. Some of this diminished size on the LEFT could be due to decompression of necrotic material. Correlate with any symptoms of inflammation or infection in this area as well given the paucity of residual soft tissue associated with the above abnormality, overall findings are compatible with residual disease in this location and there is no significant surrounding stranding to indicate inflammation that would correlate with the degree of FDG uptake remaining. 2. Diminished activity in the juxta cardiac mass with peripheral activity suggesting interval necrosis, on the prior study this was uniformly hypermetabolic. There is still considerable FDG uptake. 3. No signs of disease in the abdomen or pelvis. 4. 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 Correlate with any new pain or symptoms in this area with further imaging as warranted. No discrete mass or bone destruction on today's study.   06/08/2020 Imaging   CT neck 1. Status post laryngectomy and neck dissection without lymphadenopathy or mass lesion. 2. Short segment severe stenosis of the left internal carotid artery adjacent to a surgical clip.     06/08/2020 Imaging   CT chest 1. There is a large, hypodense lesion within, or very closely abutting and likely intruding upon the right ventricle, as seen on prior examination and approximately 4.6 x 4.3 cm, poorly evaluated due to  cardiac motion. Findings are consistent with metastatic lesion and/or thrombus and poorly assessed by ungated CT. Consider echocardiography and cardiac MRI for further assessment.   2. Multiple small pulmonary nodules in the bilateral lung apices are unchanged. Attention on follow-up.   3.  No evidence of new metastatic disease in the chest.   4.  Aortic Atherosclerosis (ICD10-I70.0).     09/07/2020 Imaging   1. Mild decrease in size of juxta cardiac metastatic involving the right ventricle. 2. Small pulmonary nodules are unchanged from previous exam. 3. Aortic atherosclerosis. 4. Gallstone.     12/14/2020 Procedure   Successful exchange and up size from 20 French pull-through gastrostomy to 22 French balloon retention gastrostomy.   03/30/2021 Imaging   Ct neck 1.  In comparison with PET/CT from February 2021, marked interval decrease in size of soft tissue mass along the angle of the left mandible consistent with a positive treatment response. However, there are new destructive bony changes along the lingual surface of the mandible in this region with appearance of soft tissue infiltrating the marrow cavity. While concerning for neoplastic involvement, changes could also represent other nonspecific osteonecrosis including infection or sequela of prior radiation treatment. Recommend correlation with direct visual inspection. Further evaluation with MRI may be of value.  2.  Centrally necrotic 1.3 cm right level IIa lymph node corresponding with previously described hypermetabolic lesion, decreased in size from prior. No suspicious new or enlarged lymph nodes in the neck.  3.  Enlarged right lower paratracheal lymph node suspected in the mediastinum, incompletely imaged. Consider dedicated CT imaging of the chest.  4.  Scattered pulmonary nodules in the imaged lung apices, the largest a 9 mm lesion in the right upper lobe. Recommend comparison with outside imaging ofthe chest if available.      REVIEW OF SYSTEMS:   Constitutional: Denies fevers, chills or abnormal weight loss Eyes: Denies blurriness of vision Ears, nose, mouth, throat, and face: Denies mucositis or sore throat Cardiovascular: Denies palpitation, chest discomfort or lower extremity swelling Gastrointestinal:  Denies nausea, heartburn or change in bowel habits Skin: Denies abnormal skin rashes Lymphatics: Denies new lymphadenopathy or easy bruising Neurological:Denies numbness, tingling or new weaknesses Behavioral/Psych: Mood is stable, no new changes  All other systems were reviewed with the patient and are negative.  I have reviewed the past medical history, past surgical history, social history and family history with the patient and they are unchanged from previous note.  ALLERGIES:  has No Known Allergies.  MEDICATIONS:  Current Outpatient Medications  Medication Sig Dispense Refill   chlorhexidine (PERIDEX) 0.12 % solution Use as directed 15 mLs in the mouth or throat 2 (two) times daily. Rinse with 15 mls twice daily for 30 seconds. Use after breakfast and at bedtime. Spit out excess. Do not swallow. 473 mL 2   Cholecalciferol (VITAMIN D3) 125 MCG (5000 UT) CAPS Take 5,000 Units by mouth daily.     HYDROcodone-acetaminophen (NORCO/VICODIN) 5-325 MG tablet TAKE 1 TABLET BY MOUTH EVERY 6 HOURS AS NEEDED FOR MODERATE PAIN 60 tablet 0   levothyroxine (SYNTHROID) 137 MCG tablet Take 137 mcg by mouth daily before breakfast.     lidocaine-prilocaine (EMLA) cream Apply topically daily as needed. 30 g 9   Nutritional Supplements (KATE FARMS PEPTIDE 1.5) LIQD Take 4 Bottles by mouth daily. 325 mL 11   nystatin (MYCOSTATIN) 100000 UNIT/ML suspension Take 5 mLs by mouth 2 (two) times daily.     ondansetron (ZOFRAN) 8 MG tablet Take 1 tablet (8 mg total) by mouth 2 (two) times daily as needed (Nausea or vomiting). (Patient taking differently: Take 8 mg by mouth 2 (two) times daily as needed for nausea or vomiting  (Nausea or vomiting).) 30 tablet 1   oxymetazoline (AFRIN) 0.05 % nasal spray Place 3 sprays into both nostrils 2 (two) times daily as needed (Bleeding from stoma). 30 mL 0   prochlorperazine (COMPAZINE) 10 MG tablet Take 1 tablet (10 mg total) by mouth every 6 (six) hours as needed (Nausea or vomiting). (Patient taking differently: Take 10 mg by mouth every 6 (six) hours as needed for nausea or vomiting (Nausea or vomiting).) 30 tablet 1   No current facility-administered medications for this visit.    PHYSICAL EXAMINATION: ECOG PERFORMANCE STATUS: 1 - Symptomatic but completely ambulatory  Vitals:   04/20/21 1119  BP: 112/79  Pulse: 81  Resp: 18  Temp: 97.9 F (36.6 C)  SpO2: 92%   Filed Weights   04/20/21 1119  Weight: 143 lb (64.9 kg)    GENERAL:alert, no distress and comfortable NEURO: alert & oriented x 3 with fluent speech, no focal motor/sensory deficits  LABORATORY DATA:  I have reviewed the data as listed    Component Value Date/Time   NA 141 04/13/2021 0827   K  4.2 04/13/2021 0827   CL 101 04/13/2021 0827   CO2 28 04/13/2021 0827   GLUCOSE 103 (H) 04/13/2021 0827   BUN 23 04/13/2021 0827   CREATININE 0.71 04/13/2021 0827   CALCIUM 9.4 04/13/2021 0827   PROT 7.6 04/13/2021 0827   ALBUMIN 4.3 04/13/2021 0827   AST 22 04/13/2021 0827   ALT 17 04/13/2021 0827   ALKPHOS 56 04/13/2021 0827   BILITOT 0.5 04/13/2021 0827   GFRNONAA >60 04/13/2021 0827   GFRAA >60 06/08/2020 1303   GFRAA >60 02/17/2020 1128    No results found for: SPEP, UPEP  Lab Results  Component Value Date   WBC 4.2 04/13/2021   NEUTROABS 2.0 04/13/2021   HGB 13.2 04/13/2021   HCT 39.4 04/13/2021   MCV 86.8 04/13/2021   PLT 226 04/13/2021      Chemistry      Component Value Date/Time   NA 141 04/13/2021 0827   K 4.2 04/13/2021 0827   CL 101 04/13/2021 0827   CO2 28 04/13/2021 0827   BUN 23 04/13/2021 0827   CREATININE 0.71 04/13/2021 0827      Component Value Date/Time    CALCIUM 9.4 04/13/2021 0827   ALKPHOS 56 04/13/2021 0827   AST 22 04/13/2021 0827   ALT 17 04/13/2021 0827   BILITOT 0.5 04/13/2021 0827       RADIOGRAPHIC STUDIES: I have reviewed multiple CT imaging with the patient and her partner I have personally reviewed the radiological images as listed and agreed with the findings in the report. CT CHEST W CONTRAST  Result Date: 04/20/2021 CLINICAL DATA:  Monitor lung metastatic disease, history of laryngeal cancer and tracheostomy EXAM: CT CHEST WITH CONTRAST TECHNIQUE: Multidetector CT imaging of the chest was performed during intravenous contrast administration. CONTRAST:  23mL OMNIPAQUE IOHEXOL 300 MG/ML  SOLN COMPARISON:  09/07/2020 FINDINGS: Cardiovascular: Right chest port catheter. Aortic atherosclerosis. Normal heart size. No significant change in a large, heterogeneously hypodense mass abutting, and likely intruding into the right ventricle, measuring 4.5 x 4.2 cm (series 2, image 89). No pericardial effusion. Mediastinum/Nodes: No enlarged mediastinal, hilar, or axillary lymph nodes. Status post laryngectomy and tracheostomy apparent thyroid gland and esophagus demonstrate no significant findings. Lungs/Pleura: Tracheostomy. Minimal biapical radiation fibrosis. There is a new pulmonary nodule of the right upper lobe measuring 8 mm (series 4, image 32). Other small nodules are stable, for example a 3 mm nodule of the right apex (series 4, image 15). Minimal scarring and or partial atelectasis of the lung bases. No pleural effusion or pneumothorax. Upper Abdomen: No acute abnormality.  Percutaneous gastrostomy. Musculoskeletal: No chest wall mass or suspicious bone lesions identified. IMPRESSION: 1. No significant change in a large, heterogeneously hypodense mass abutting, and likely intruding into the right ventricle, measuring 4.5 x 4.2 cm, consistent with metastatic lesion. 2. New pulmonary nodule of the right upper lobe measuring 8 mm, highly  concerning for pulmonary metastatic disease. Other small nodules are stable and presumably benign and incidental. Attention on follow-up. 3. Status post laryngectomy and tracheostomy. Aortic Atherosclerosis (ICD10-I70.0). Electronically Signed   By: Eddie Candle M.D.   On: 04/20/2021 11:14

## 2021-04-22 DIAGNOSIS — R633 Feeding difficulties, unspecified: Secondary | ICD-10-CM | POA: Diagnosis not present

## 2021-04-22 DIAGNOSIS — C321 Malignant neoplasm of supraglottis: Secondary | ICD-10-CM | POA: Diagnosis not present

## 2021-04-23 ENCOUNTER — Other Ambulatory Visit (HOSPITAL_COMMUNITY): Payer: Self-pay

## 2021-04-23 DIAGNOSIS — C321 Malignant neoplasm of supraglottis: Secondary | ICD-10-CM | POA: Diagnosis not present

## 2021-04-23 DIAGNOSIS — R633 Feeding difficulties, unspecified: Secondary | ICD-10-CM | POA: Diagnosis not present

## 2021-05-03 ENCOUNTER — Ambulatory Visit (HOSPITAL_COMMUNITY)
Admission: RE | Admit: 2021-05-03 | Discharge: 2021-05-03 | Disposition: A | Discharge status: Home / Self Care | Payer: Medicare HMO | Source: Ambulatory Visit | Attending: Hematology and Oncology | Admitting: Hematology and Oncology

## 2021-05-03 ENCOUNTER — Encounter (HOSPITAL_COMMUNITY): Payer: Medicare HMO

## 2021-05-03 ENCOUNTER — Ambulatory Visit (HOSPITAL_COMMUNITY): Payer: Medicare HMO

## 2021-05-03 ENCOUNTER — Telehealth: Payer: Self-pay

## 2021-05-03 ENCOUNTER — Other Ambulatory Visit: Payer: Self-pay | Admitting: Hematology and Oncology

## 2021-05-03 ENCOUNTER — Other Ambulatory Visit: Payer: Self-pay

## 2021-05-03 DIAGNOSIS — I517 Cardiomegaly: Secondary | ICD-10-CM | POA: Diagnosis not present

## 2021-05-03 DIAGNOSIS — C7800 Secondary malignant neoplasm of unspecified lung: Secondary | ICD-10-CM | POA: Diagnosis not present

## 2021-05-03 DIAGNOSIS — C329 Malignant neoplasm of larynx, unspecified: Secondary | ICD-10-CM

## 2021-05-03 DIAGNOSIS — R0600 Dyspnea, unspecified: Secondary | ICD-10-CM

## 2021-05-03 DIAGNOSIS — R0609 Other forms of dyspnea: Secondary | ICD-10-CM

## 2021-05-03 DIAGNOSIS — Z0189 Encounter for other specified special examinations: Secondary | ICD-10-CM

## 2021-05-03 DIAGNOSIS — I5189 Other ill-defined heart diseases: Secondary | ICD-10-CM

## 2021-05-03 DIAGNOSIS — I252 Old myocardial infarction: Secondary | ICD-10-CM | POA: Diagnosis not present

## 2021-05-03 DIAGNOSIS — Z01818 Encounter for other preprocedural examination: Secondary | ICD-10-CM | POA: Diagnosis not present

## 2021-05-03 LAB — ECHOCARDIOGRAM COMPLETE
AR max vel: 1.84 cm2
AV Area VTI: 1.76 cm2
AV Area mean vel: 1.54 cm2
AV Mean grad: 2 mmHg
AV Peak grad: 3.4 mmHg
Ao pk vel: 0.92 m/s
Area-P 1/2: 2.28 cm2
Calc EF: 52.8 %
S' Lateral: 1.3 cm
Single Plane A2C EF: 49.8 %
Single Plane A4C EF: 55 %

## 2021-05-03 NOTE — Progress Notes (Signed)
*  PRELIMINARY RESULTS* Echocardiogram 2D Echocardiogram has been performed.  Luisa Hart RDCS 05/03/2021, 10:34 AM

## 2021-05-03 NOTE — Telephone Encounter (Signed)
Called and given appt at 8 am for tomorrow at [facility] for venous doppler ultrasound. Cindy verbalized understanding.

## 2021-05-04 ENCOUNTER — Inpatient Hospital Stay: Payer: Medicare HMO

## 2021-05-04 ENCOUNTER — Inpatient Hospital Stay: Payer: Medicare HMO | Attending: Hematology

## 2021-05-04 ENCOUNTER — Inpatient Hospital Stay (HOSPITAL_BASED_OUTPATIENT_CLINIC_OR_DEPARTMENT_OTHER): Payer: Medicare HMO | Admitting: Hematology and Oncology

## 2021-05-04 ENCOUNTER — Other Ambulatory Visit (HOSPITAL_COMMUNITY): Payer: Self-pay

## 2021-05-04 ENCOUNTER — Ambulatory Visit (HOSPITAL_COMMUNITY)
Admission: RE | Admit: 2021-05-04 | Discharge: 2021-05-04 | Disposition: A | Discharge status: Home / Self Care | Payer: Medicare HMO | Source: Ambulatory Visit | Attending: Hematology and Oncology | Admitting: Hematology and Oncology

## 2021-05-04 ENCOUNTER — Encounter: Payer: Self-pay | Admitting: Hematology and Oncology

## 2021-05-04 DIAGNOSIS — I513 Intracardiac thrombosis, not elsewhere classified: Secondary | ICD-10-CM

## 2021-05-04 DIAGNOSIS — C321 Malignant neoplasm of supraglottis: Secondary | ICD-10-CM | POA: Insufficient documentation

## 2021-05-04 DIAGNOSIS — Z930 Tracheostomy status: Secondary | ICD-10-CM | POA: Diagnosis not present

## 2021-05-04 DIAGNOSIS — C329 Malignant neoplasm of larynx, unspecified: Secondary | ICD-10-CM

## 2021-05-04 DIAGNOSIS — Z5112 Encounter for antineoplastic immunotherapy: Secondary | ICD-10-CM | POA: Diagnosis not present

## 2021-05-04 DIAGNOSIS — G893 Neoplasm related pain (acute) (chronic): Secondary | ICD-10-CM | POA: Insufficient documentation

## 2021-05-04 DIAGNOSIS — I5189 Other ill-defined heart diseases: Secondary | ICD-10-CM | POA: Insufficient documentation

## 2021-05-04 DIAGNOSIS — Z79899 Other long term (current) drug therapy: Secondary | ICD-10-CM | POA: Insufficient documentation

## 2021-05-04 DIAGNOSIS — R0609 Other forms of dyspnea: Secondary | ICD-10-CM

## 2021-05-04 DIAGNOSIS — Z923 Personal history of irradiation: Secondary | ICD-10-CM | POA: Diagnosis not present

## 2021-05-04 DIAGNOSIS — E039 Hypothyroidism, unspecified: Secondary | ICD-10-CM | POA: Diagnosis not present

## 2021-05-04 DIAGNOSIS — Z87891 Personal history of nicotine dependence: Secondary | ICD-10-CM | POA: Insufficient documentation

## 2021-05-04 DIAGNOSIS — Z95828 Presence of other vascular implants and grafts: Secondary | ICD-10-CM

## 2021-05-04 DIAGNOSIS — R0600 Dyspnea, unspecified: Secondary | ICD-10-CM | POA: Diagnosis not present

## 2021-05-04 LAB — CBC WITH DIFFERENTIAL/PLATELET
Abs Immature Granulocytes: 0.01 10*3/uL (ref 0.00–0.07)
Basophils Absolute: 0 10*3/uL (ref 0.0–0.1)
Basophils Relative: 0 %
Eosinophils Absolute: 0.3 10*3/uL (ref 0.0–0.5)
Eosinophils Relative: 6 %
HCT: 38.9 % (ref 36.0–46.0)
Hemoglobin: 13.2 g/dL (ref 12.0–15.0)
Immature Granulocytes: 0 %
Lymphocytes Relative: 24 %
Lymphs Abs: 0.9 10*3/uL (ref 0.7–4.0)
MCH: 29.4 pg (ref 26.0–34.0)
MCHC: 33.9 g/dL (ref 30.0–36.0)
MCV: 86.6 fL (ref 80.0–100.0)
Monocytes Absolute: 0.5 10*3/uL (ref 0.1–1.0)
Monocytes Relative: 12 %
Neutro Abs: 2.3 10*3/uL (ref 1.7–7.7)
Neutrophils Relative %: 58 %
Platelets: 230 10*3/uL (ref 150–400)
RBC: 4.49 MIL/uL (ref 3.87–5.11)
RDW: 13.3 % (ref 11.5–15.5)
WBC: 4 10*3/uL (ref 4.0–10.5)
nRBC: 0 % (ref 0.0–0.2)

## 2021-05-04 LAB — COMPREHENSIVE METABOLIC PANEL
ALT: 17 U/L (ref 0–44)
AST: 24 U/L (ref 15–41)
Albumin: 4 g/dL (ref 3.5–5.0)
Alkaline Phosphatase: 60 U/L (ref 38–126)
Anion gap: 11 (ref 5–15)
BUN: 21 mg/dL (ref 8–23)
CO2: 24 mmol/L (ref 22–32)
Calcium: 9.3 mg/dL (ref 8.9–10.3)
Chloride: 104 mmol/L (ref 98–111)
Creatinine, Ser: 0.77 mg/dL (ref 0.44–1.00)
GFR, Estimated: >60 mL/min (ref >60)
Glucose, Bld: 95 mg/dL (ref 70–99)
Potassium: 4.3 mmol/L (ref 3.5–5.1)
Sodium: 139 mmol/L (ref 135–145)
Total Bilirubin: 0.4 mg/dL (ref 0.3–1.2)
Total Protein: 7.4 g/dL (ref 6.5–8.1)

## 2021-05-04 LAB — TSH: TSH: 1.346 u[IU]/mL (ref 0.308–3.960)

## 2021-05-04 MED ORDER — SODIUM CHLORIDE 0.9 % IV SOLN
200.0000 mg | Freq: Once | INTRAVENOUS | Status: AC
  Administered 2021-05-04: 200 mg via INTRAVENOUS
  Filled 2021-05-04: qty 8

## 2021-05-04 MED ORDER — SODIUM CHLORIDE 0.9% FLUSH
10.0000 mL | INTRAVENOUS | Status: DC | PRN
Start: 2021-05-04 — End: 2021-05-04
  Administered 2021-05-04: 10 mL
  Filled 2021-05-04: qty 10

## 2021-05-04 MED ORDER — SODIUM CHLORIDE 0.9% FLUSH
10.0000 mL | Freq: Once | INTRAVENOUS | Status: AC
  Administered 2021-05-04: 10 mL
  Filled 2021-05-04: qty 10

## 2021-05-04 MED ORDER — SODIUM CHLORIDE 0.9 % IV SOLN
Freq: Once | INTRAVENOUS | Status: AC
  Filled 2021-05-04: qty 250

## 2021-05-04 MED ORDER — HYDROCODONE-ACETAMINOPHEN 5-325 MG PO TABS
1.0000 | ORAL_TABLET | Freq: Four times a day (QID) | ORAL | 0 refills | Status: DC | PRN
  Filled 2021-05-04: qty 60, 15d supply, fill #0

## 2021-05-04 MED ORDER — APIXABAN 5 MG PO TABS
5.0000 mg | ORAL_TABLET | Freq: Two times a day (BID) | ORAL | 3 refills | Status: DC
  Filled 2021-05-04: qty 60, 30d supply, fill #0

## 2021-05-04 MED ORDER — HEPARIN SOD (PORK) LOCK FLUSH 100 UNIT/ML IV SOLN
500.0000 [IU] | Freq: Once | INTRAVENOUS | Status: AC | PRN
  Administered 2021-05-04: 500 [IU]
  Filled 2021-05-04: qty 5

## 2021-05-04 NOTE — Assessment & Plan Note (Signed)
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

## 2021-05-04 NOTE — Assessment & Plan Note (Signed)
She is able to reduce the amount of pain medicine required She will continue pain medicine taper as tolerated I have refilled her prescription pain medicine today

## 2021-05-04 NOTE — Assessment & Plan Note (Signed)
We monitor her thyroid function closely We will adjust the dose of Synthroid as needed

## 2021-05-04 NOTE — Progress Notes (Signed)
Bilateral lower extremity venous duplex completed. Refer to "CV Proc" under chart review to view preliminary results.  05/04/2021 8:37 AM Kelby Aline., MHA, RVT, RDCS, RDMS

## 2021-05-04 NOTE — Patient Instructions (Signed)
Pembrolizumab injection What is this medication? PEMBROLIZUMAB (pem broe liz ue mab) is a monoclonal antibody. It is used totreat certain types of cancer. This medicine may be used for other purposes; ask your health care provider orpharmacist if you have questions. COMMON BRAND NAME(S): Keytruda What should I tell my care team before I take this medication? They need to know if you have any of these conditions: autoimmune diseases like Crohn's disease, ulcerative colitis, or lupus have had or planning to have an allogeneic stem cell transplant (uses someone else's stem cells) history of organ transplant history of chest radiation nervous system problems like myasthenia gravis or Guillain-Barre syndrome an unusual or allergic reaction to pembrolizumab, other medicines, foods, dyes, or preservatives pregnant or trying to get pregnant breast-feeding How should I use this medication? This medicine is for infusion into a vein. It is given by a health careprofessional in a hospital or clinic setting. A special MedGuide will be given to you before each treatment. Be sure to readthis information carefully each time. Talk to your pediatrician regarding the use of this medicine in children. While this drug may be prescribed for children as young as 6 months for selectedconditions, precautions do apply. Overdosage: If you think you have taken too much of this medicine contact apoison control center or emergency room at once. NOTE: This medicine is only for you. Do not share this medicine with others. What if I miss a dose? It is important not to miss your dose. Call your doctor or health careprofessional if you are unable to keep an appointment. What may interact with this medication? Interactions have not been studied. This list may not describe all possible interactions. Give your health care provider a list of all the medicines, herbs, non-prescription drugs, or dietary supplements you use. Also  tell them if you smoke, drink alcohol, or use illegaldrugs. Some items may interact with your medicine. What should I watch for while using this medication? Your condition will be monitored carefully while you are receiving thismedicine. You may need blood work done while you are taking this medicine. Do not become pregnant while taking this medicine or for 4 months after stopping it. Women should inform their doctor if they wish to become pregnant or think they might be pregnant. There is a potential for serious side effects to an unborn child. Talk to your health care professional or pharmacist for more information. Do not breast-feed an infant while taking this medicine orfor 4 months after the last dose. What side effects may I notice from receiving this medication? Side effects that you should report to your doctor or health care professionalas soon as possible: allergic reactions like skin rash, itching or hives, swelling of the face, lips, or tongue bloody or black, tarry breathing problems changes in vision chest pain chills confusion constipation cough diarrhea dizziness or feeling faint or lightheaded fast or irregular heartbeat fever flushing joint pain low blood counts - this medicine may decrease the number of white blood cells, red blood cells and platelets. You may be at increased risk for infections and bleeding. muscle pain muscle weakness pain, tingling, numbness in the hands or feet persistent headache redness, blistering, peeling or loosening of the skin, including inside the mouth signs and symptoms of high blood sugar such as dizziness; dry mouth; dry skin; fruity breath; nausea; stomach pain; increased hunger or thirst; increased urination signs and symptoms of kidney injury like trouble passing urine or change in the amount of urine signs and  symptoms of liver injury like dark urine, light-colored stools, loss of appetite, nausea, right upper belly pain,  yellowing of the eyes or skin sweating swollen lymph nodes weight loss Side effects that usually do not require medical attention (report to yourdoctor or health care professional if they continue or are bothersome): decreased appetite hair loss tiredness This list may not describe all possible side effects. Call your doctor for medical advice about side effects. You may report side effects to FDA at1-800-FDA-1088. Where should I keep my medication? This drug is given in a hospital or clinic and will not be stored at home. NOTE: This sheet is a summary. It may not cover all possible information. If you have questions about this medicine, talk to your doctor, pharmacist, orhealth care provider.  2022 Elsevier/Gold Standard (2019-08-11 21:44:53)

## 2021-05-04 NOTE — Progress Notes (Signed)
[name] OFFICE PROGRESS NOTE  Patient Care Team: Hamrick, Lorin Mercy, MD as PCP - General (Family Medicine) Heath Lark, MD as Consulting Physician (Hematology and Oncology)  ASSESSMENT & PLAN:  Laryngeal squamous cell carcinoma ([place]) I have reviewed all her imaging studies and summarized all the findings with the patient and her son Her original PET CT scan from last year show the RV mass was much bigger Over the years, it has gotten smaller On review of her recent echocardiogram, it is thought that the RV mass represent thrombus rather than malignancy She has excellent response to immunotherapy and no signs of progression The plan will be to continue pembrolizumab indefinitely She is in agreement I do not plan to repeat imaging study again until November  RV (right ventricular) mural thrombus After much discussion with cardiologist, it is felt that the abnormal cardiac mass is likely due to right ventricular thrombus Further imaging such as MRI of the heart or transesophageal echocardiogram can be performed Her venous Doppler ultrasound of lower extremity showed no evidence of DVT The source of her thrombus is unknown but likely has been present for more than a year and over the course of the last year, it has gotten smaller The patient declined further imaging She is in agreement to be anticoagulated We discussed the risk and benefits of anticoagulation therapy with Eliquis and she is in agreement to proceed I plan to recheck imaging study again in 3 months with repeat echocardiogram  Cancer associated pain She is able to reduce the amount of pain medicine required She will continue pain medicine taper as tolerated I have refilled her prescription pain medicine today  Hypothyroidism We monitor her thyroid function closely We will adjust the dose of Synthroid as needed  Orders Placed This Encounter  Procedures   Comprehensive metabolic panel    Standing  Status:   Standing    Number of Occurrences:   33    Standing Expiration Date:   05/04/2022   CBC with Differential/Platelet    Standing Status:   Standing    Number of Occurrences:   22    Standing Expiration Date:   05/04/2022    All questions were answered. The patient knows to call the clinic with any problems, questions or concerns. The total time spent in the appointment was 30 minutes encounter with patients including review of chart and various tests results, discussions about plan of care and coordination of care plan   Heath Lark, MD 05/04/2021 10:27 AM  INTERVAL HISTORY: Please see below for problem oriented charting. She returns with her son for further follow-up She denies recent chest pain or shortness of breath Her chronic pain is stable No recent falls  SUMMARY OF ONCOLOGIC HISTORY: Oncology History  Laryngeal squamous cell carcinoma ([place])  12/02/2018 Imaging   CT neck: IMPRESSION: 1. Supraglottic laryngeal carcinoma extending across the anterior commissure and invading the prelaryngeal fat, left aryepiglottic fold and left aspect of the epiglottis. 2. Severe narrowing of the laryngeal airway. 3. Bilateral subcentimeter level 2A and 2A cervical lymph nodes.     12/02/2018 Imaging   CT  Chest:  IMPRESSION: No evidence of metastatic disease in the chest.   12/03/2018 Pathology Results   (Care Everywhere; [facility]) A: Larynx, supraglottis, biopsy - Atypical squamous proliferation with at least low grade dysplasia and abundant keratinization - No definite high grade dysplasia or invasive carcinoma represented (see comment)   12/18/2018 Pathology Results   (Care Everywhere; [facility]) A:  Neck, left, fine needle aspiration - Rare single atypical cells, no cells diagnostic of malignancy - Lymph node material present  B:  Neck, right, fine needle aspiration - Non-diagnostic specimen - No lymph node material identified - Mixed inflammation and blood    12/31/2018 Pathology Results   (Care Everywhere; [facility])  A: Larynx, left supraglottic mass, biopsy - Atypical squamous proliferation with abundant keratinization, suspicious for well-differentiated invasive squamous cell carcinoma  B: Larynx, left supraglottic mass, biopsy - Well-differentiated invasive squamous cell carcinoma with abundant keratinization (see comment)   01/18/2019 Initial Diagnosis   Laryngeal cancer ([place])   01/26/2019 Imaging   1. Locally advanced hypermetabolic laryngeal neoplasm centered in the left supraglottic space with bilateral glottic and epiglottic involvement and subglottic extension on the left. 2. Hypermetabolic bilateral level 3 neck nodal metastases. 3. Hypermetabolic high mediastinal nodal metastases between the trachea and upper thoracic esophagus. 4. Otherwise no distant hypermetabolic metastatic disease. 5. Nodular 7 mm focus at the left lung base, below PET resolution, recommend attention on follow-up chest CT in 3 months. 6. Chronic findings include: Aortic Atherosclerosis (ICD10-I70.0). Cholelithiasis.   02/08/2019 Cancer Staging   Staging form: Larynx - Supraglottis, AJCC 8th Edition - Clinical stage from 02/08/2019: Stage IVA (cT3, cN2c, cM0) - Signed by Eppie Gibson, MD on 02/08/2019   02/11/2019 - 04/07/2019 Chemotherapy   The patient weekly cisplatin for chemotherapy treatment.     07/20/2019 Pathology Results   PROCEDURE: Total glossectomy, total pharyngectomy, total laryngectomy, total thyroidectomy, and cervical esophagectomy TUMOR SITE: Larynx TUMOR LATERALITY: Not specified TUMOR FOCALITY: Unifocal TUMOR SIZE:    GREATEST DIMENSION: 6.9 cm    ADDITIONAL DIMENSIONS: 5.4 x 2.4 cm HISTOLOGIC TYPE: Squamous cell carcinoma, conventional (keratinizing) HISTOLOGIC GRADE:  G1-G2: Well to moderately differentiated TUMOR EXTENSION: Tumor involves the posterior tongue, pharynx, larynx, tracheal cartilage, hyoid bone, cervical esophagus,  vagus nerve, carotid artery, deep cervical fascia, ans surrounding skeletal muscle and soft tissue MARGINS: Uninvolved by invasive tumor; Perineural invasion is less than 82mm from the left soft tissue margin LYMPHOVASCULAR INVASION: Not identified PERINEURAL INVASION:  Present REGIONAL LYMPH NODES:     NUMBER OF LYMPH NODES INVOLVED: 2     NUMBER OF LYMPH NODES EXAMINED: 24     LATERALITY OF LYMPH NODES INVOLVED: Cannot determine     SIZE OF LARGEST METASTATIC DEPOSIT: 0.8 cm     EXTRANODAL EXTENSION: Not identified PATHOLOGIC STAGE CLASSIFICATION (pTNM, AJCC 8TH Ed): (r)(y) pT4b pN2a DISTANT METASTASIS (pM): ADDITIONAL PATHOLOGIC FINDINGS: Previous chemoradiation   11/04/2019 Imaging   PET: 1.  Hypermetabolic bilateral neck lesions as detailed above are concerning for disease recurrence. 2.  Compared to 07/08/2019 PET scan, interval development of subcentimeter bilateral upper lobe pulmonary nodules, size below PET resolution. These are concerning for metastasis. Consider diagnostic quality CT chest exam for further evaluation as per oncology treatment planning. 3.  Focal hypermetabolic uptake in right ventricular cavity of indeterminate significance. Recommend echocardiographic examination to rule out underlying thrombus. This finding was discussed with Dr. Conley Canal by Dr. Clementeen Graham and Dr. Marcello Moores on 11/04/2019 at approximately 3:00 PM via telephone. 4.  Ancillary CT findings as above.   11/05/2019 Imaging   Echocardiogram: Summary   1. The left ventricle is normal in size with normal wall thickness.   2. The left ventricular systolic function is normal, LVEF is visually estimated at 60-65%.   3. The right ventricle is normal in size, with normal systolic function.   4. There is an extrinsic compression  of the RV and RA laterally at the AV groove by a mass of 4.3 cm diameter. Unclear if the mass is invasive, although no pericardial effusion seen suggestive no invasion possibly. There is  no evidence of hemodynamic compromise secondary to cardiac compression with normal IVC diameter.   12/06/2019 Pathology Results   A. LYMPH NODE, LEFT SUBMANDIBULAR, BIOPSY:  - Squamous cell carcinoma.  - No distinct nodal tissue identified.    12/06/2019 Procedure   Successful ultrasound-guided core biopsy of centrally necrotic left submandibular lymph node. Of note, due to extensive prior postsurgical and post radiation changes, patient has very limited mobility in the neck and the biopsy was extremely challenging    12/16/2019 -  Chemotherapy   The patient had pembrolizumab for chemotherapy treatment.     03/08/2020 PET scan   1. Diminished size of masses in the neck as described. Persistent increased metabolic activity particularly in the LEFT neck. Some of this diminished size on the LEFT could be due to decompression of necrotic material. Correlate with any symptoms of inflammation or infection in this area as well given the paucity of residual soft tissue associated with the above abnormality, overall findings are compatible with residual disease in this location and there is no significant surrounding stranding to indicate inflammation that would correlate with the degree of FDG uptake remaining. 2. Diminished activity in the juxta cardiac mass with peripheral activity suggesting interval necrosis, on the prior study this was uniformly hypermetabolic. There is still considerable FDG uptake. 3. No signs of disease in the abdomen or pelvis. 4. 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 Correlate with any new pain or symptoms in this area with further imaging as warranted. No discrete mass or bone destruction on today's study.   06/08/2020 Imaging   CT neck 1. Status post laryngectomy and neck dissection without lymphadenopathy or mass lesion. 2. Short segment severe stenosis of the left internal carotid artery adjacent to a surgical clip.     06/08/2020 Imaging   CT chest 1. There is  a large, hypodense lesion within, or very closely abutting and likely intruding upon the right ventricle, as seen on prior examination and approximately 4.6 x 4.3 cm, poorly evaluated due to cardiac motion. Findings are consistent with metastatic lesion and/or thrombus and poorly assessed by ungated CT. Consider echocardiography and cardiac MRI for further assessment.   2. Multiple small pulmonary nodules in the bilateral lung apices are unchanged. Attention on follow-up.   3.  No evidence of new metastatic disease in the chest.   4.  Aortic Atherosclerosis (ICD10-I70.0).     09/07/2020 Imaging   1. Mild decrease in size of juxta cardiac metastatic involving the right ventricle. 2. Small pulmonary nodules are unchanged from previous exam. 3. Aortic atherosclerosis. 4. Gallstone.     12/14/2020 Procedure   Successful exchange and up size from 20 French pull-through gastrostomy to 22 French balloon retention gastrostomy.   03/30/2021 Imaging   Ct neck 1.  In comparison with PET/CT from February 2021, marked interval decrease in size of soft tissue mass along the angle of the left mandible consistent with a positive treatment response. However, there are new destructive bony changes along the lingual surface of the mandible in this region with appearance of soft tissue infiltrating the marrow cavity. While concerning for neoplastic involvement, changes could also represent other nonspecific osteonecrosis including infection or sequela of prior radiation treatment. Recommend correlation with direct visual inspection. Further evaluation with MRI may be of value.  2.  Centrally necrotic 1.3 cm right level IIa lymph node corresponding with previously described hypermetabolic lesion, decreased in size from prior. No suspicious new or enlarged lymph nodes in the neck.  3.  Enlarged right lower paratracheal lymph node suspected in the mediastinum, incompletely imaged. Consider dedicated CT imaging of the  chest.  4.  Scattered pulmonary nodules in the imaged lung apices, the largest a 9 mm lesion in the right upper lobe. Recommend comparison with outside imaging ofthe chest if available.   05/03/2021 Echocardiogram    1. Left ventricular ejection fraction, by estimation, is 55 to 60%. The left ventricle has normal function. The left ventricle has no regional wall motion abnormalities. There is mild left ventricular hypertrophy. Left ventricular diastolic parameters are consistent with Grade I diastolic dysfunction (impaired relaxation).  2. Prominent echogenic structure on the RV free wall, possibly attached to the TV - measuring about 2.0 cm in diameter and 3.0 cm in length - some mobile filamentous material is attached, suggestive of thrombus or less likely tumor.. Right ventricular systolic function is normal. The right ventricular size is normal.  3. The mitral valve is grossly normal. Trivial mitral valve regurgitation.  4. The aortic valve is tricuspid. Aortic valve regurgitation is not visualized.     05/04/2021 Imaging   RIGHT: - There is no evidence of deep vein thrombosis in the lower extremity.   - No cystic structure found in the popliteal fossa.   LEFT: - There is no evidence of deep vein thrombosis in the lower extremity.   - No cystic structure found in the popliteal fossa.       REVIEW OF SYSTEMS:   Constitutional: Denies fevers, chills or abnormal weight loss Eyes: Denies blurriness of vision Ears, nose, mouth, throat, and face: Denies mucositis or sore throat Respiratory: Denies cough, dyspnea or wheezes Cardiovascular: Denies palpitation, chest discomfort or lower extremity swelling Gastrointestinal:  Denies nausea, heartburn or change in bowel habits Skin: Denies abnormal skin rashes Lymphatics: Denies new lymphadenopathy or easy bruising Neurological:Denies numbness, tingling or new weaknesses Behavioral/Psych: Mood is stable, no new changes  All other systems  were reviewed with the patient and are negative.  I have reviewed the past medical history, past surgical history, social history and family history with the patient and they are unchanged from previous note.  ALLERGIES:  has No Known Allergies.  MEDICATIONS:  Current Outpatient Medications  Medication Sig Dispense Refill   apixaban (ELIQUIS) 5 MG TABS tablet Take 1 tablet (5 mg total) by mouth 2 (two) times daily. 60 tablet 3   chlorhexidine (PERIDEX) 0.12 % solution Use as directed 15 mLs in the mouth or throat 2 (two) times daily. Rinse with 15 mls twice daily for 30 seconds. Use after breakfast and at bedtime. Spit out excess. Do not swallow. 473 mL 2   Cholecalciferol (VITAMIN D3) 125 MCG (5000 UT) CAPS Take 5,000 Units by mouth daily.     HYDROcodone-acetaminophen (NORCO/VICODIN) 5-325 MG tablet TAKE 1 TABLET BY MOUTH EVERY 6 HOURS AS NEEDED FOR MODERATE PAIN 60 tablet 0   levothyroxine (SYNTHROID) 137 MCG tablet Take 137 mcg by mouth daily before breakfast.     lidocaine-prilocaine (EMLA) cream Apply topically daily as needed. 30 g 9   Nutritional Supplements (KATE FARMS PEPTIDE 1.5) LIQD Take 4 Bottles by mouth daily. 325 mL 11   nystatin (MYCOSTATIN) 100000 UNIT/ML suspension Take 5 mLs by mouth 2 (two) times daily.     ondansetron (ZOFRAN) 8 MG tablet  Take 1 tablet (8 mg total) by mouth 2 (two) times daily as needed (Nausea or vomiting). (Patient taking differently: Take 8 mg by mouth 2 (two) times daily as needed for nausea or vomiting (Nausea or vomiting).) 30 tablet 1   oxymetazoline (AFRIN) 0.05 % nasal spray Place 3 sprays into both nostrils 2 (two) times daily as needed (Bleeding from stoma). 30 mL 0   prochlorperazine (COMPAZINE) 10 MG tablet Take 1 tablet (10 mg total) by mouth every 6 (six) hours as needed (Nausea or vomiting). (Patient taking differently: Take 10 mg by mouth every 6 (six) hours as needed for nausea or vomiting (Nausea or vomiting).) 30 tablet 1   No current  facility-administered medications for this visit.   Facility-Administered Medications Ordered in Other Visits  Medication Dose Route Frequency Provider Last Rate Last Admin   heparin lock flush 100 unit/mL  500 Units Intracatheter Once PRN Heath Lark, MD       pembrolizumab (KEYTRUDA) 200 mg in sodium chloride 0.9 % 50 mL chemo infusion  200 mg Intravenous Once Alvy Bimler, Mashonda Broski, MD       sodium chloride flush (NS) 0.9 % injection 10 mL  10 mL Intracatheter PRN Alvy Bimler, Gracieann Stannard, MD        PHYSICAL EXAMINATION: ECOG PERFORMANCE STATUS: 1 - Symptomatic but completely ambulatory  Vitals:   05/04/21 0937  BP: 109/81  Pulse: 86  Resp: 18  Temp: 98.1 F (36.7 C)  SpO2: 100%   Filed Weights   05/04/21 0937  Weight: 142 lb 9.6 oz (64.7 kg)    GENERAL:alert, no distress and comfortable NEURO: alert & oriented x 3 with fluent speech, no focal motor/sensory deficits  LABORATORY DATA:  I have reviewed the data as listed    Component Value Date/Time   NA 139 05/04/2021 0911   K 4.3 05/04/2021 0911   CL 104 05/04/2021 0911   CO2 24 05/04/2021 0911   GLUCOSE 95 05/04/2021 0911   BUN 21 05/04/2021 0911   CREATININE 0.77 05/04/2021 0911   CREATININE 0.71 04/13/2021 0827   CALCIUM 9.3 05/04/2021 0911   PROT 7.4 05/04/2021 0911   ALBUMIN 4.0 05/04/2021 0911   AST 24 05/04/2021 0911   AST 22 04/13/2021 0827   ALT 17 05/04/2021 0911   ALT 17 04/13/2021 0827   ALKPHOS 60 05/04/2021 0911   BILITOT 0.4 05/04/2021 0911   BILITOT 0.5 04/13/2021 0827   GFRNONAA >60 05/04/2021 0911   GFRNONAA >60 04/13/2021 0827   GFRAA >60 06/08/2020 1303   GFRAA >60 02/17/2020 1128    No results found for: SPEP, UPEP  Lab Results  Component Value Date   WBC 4.0 05/04/2021   NEUTROABS 2.3 05/04/2021   HGB 13.2 05/04/2021   HCT 38.9 05/04/2021   MCV 86.6 05/04/2021   PLT 230 05/04/2021      Chemistry      Component Value Date/Time   NA 139 05/04/2021 0911   K 4.3 05/04/2021 0911   CL 104  05/04/2021 0911   CO2 24 05/04/2021 0911   BUN 21 05/04/2021 0911   CREATININE 0.77 05/04/2021 0911   CREATININE 0.71 04/13/2021 0827      Component Value Date/Time   CALCIUM 9.3 05/04/2021 0911   ALKPHOS 60 05/04/2021 0911   AST 24 05/04/2021 0911   AST 22 04/13/2021 0827   ALT 17 05/04/2021 0911   ALT 17 04/13/2021 0827   BILITOT 0.4 05/04/2021 0911   BILITOT 0.5 04/13/2021 0827  RADIOGRAPHIC STUDIES: I have reviewed multiple imaging studies with the patient I have personally reviewed the radiological images as listed and agreed with the findings in the report. CT CHEST W CONTRAST  Result Date: 04/20/2021 CLINICAL DATA:  Monitor lung metastatic disease, history of laryngeal cancer and tracheostomy EXAM: CT CHEST WITH CONTRAST TECHNIQUE: Multidetector CT imaging of the chest was performed during intravenous contrast administration. CONTRAST:  40mL OMNIPAQUE IOHEXOL 300 MG/ML  SOLN COMPARISON:  09/07/2020 FINDINGS: Cardiovascular: Right chest port catheter. Aortic atherosclerosis. Normal heart size. No significant change in a large, heterogeneously hypodense mass abutting, and likely intruding into the right ventricle, measuring 4.5 x 4.2 cm (series 2, image 89). No pericardial effusion. Mediastinum/Nodes: No enlarged mediastinal, hilar, or axillary lymph nodes. Status post laryngectomy and tracheostomy apparent thyroid gland and esophagus demonstrate no significant findings. Lungs/Pleura: Tracheostomy. Minimal biapical radiation fibrosis. There is a new pulmonary nodule of the right upper lobe measuring 8 mm (series 4, image 32). Other small nodules are stable, for example a 3 mm nodule of the right apex (series 4, image 15). Minimal scarring and or partial atelectasis of the lung bases. No pleural effusion or pneumothorax. Upper Abdomen: No acute abnormality.  Percutaneous gastrostomy. Musculoskeletal: No chest wall mass or suspicious bone lesions identified. IMPRESSION: 1. No  significant change in a large, heterogeneously hypodense mass abutting, and likely intruding into the right ventricle, measuring 4.5 x 4.2 cm, consistent with metastatic lesion. 2. New pulmonary nodule of the right upper lobe measuring 8 mm, highly concerning for pulmonary metastatic disease. Other small nodules are stable and presumably benign and incidental. Attention on follow-up. 3. Status post laryngectomy and tracheostomy. Aortic Atherosclerosis (ICD10-I70.0). Electronically Signed   By: Eddie Candle M.D.   On: 04/20/2021 11:14   ECHOCARDIOGRAM COMPLETE  Result Date: 05/03/2021    ECHOCARDIOGRAM REPORT   Patient Name:   [name] [name] Date of Exam: 05/03/2021 Medical Rec #:  [id number]       Height:       62.0 in Accession #:    [id number]      Weight:       143.0 lb Date of Birth:  1951/07/15        BSA:          1.658 m Patient Age:    42 years        BP:           120/73 mmHg Patient Gender: F               HR:           91 bpm. Exam Location:  Inpatient Procedure: 2D Echo, Cardiac Doppler and Color Doppler Indications:    CHEMO  History:        Patient has no prior history of Echocardiogram examinations.                 Previous Myocardial Infarction; Signs/Symptoms:Dyspnea.  Sonographer:    Luisa Hart RDCS Referring Phys: [id number] [facility]  Sonographer Comments: Technically challenging study due to limited acoustic windows. Image acquisition challenging due to respiratory motion and Image acquisition challenging due to patient body habitus. IMPRESSIONS  1. Left ventricular ejection fraction, by estimation, is 55 to 60%. The left ventricle has normal function. The left ventricle has no regional wall motion abnormalities. There is mild left ventricular hypertrophy. Left ventricular diastolic parameters are consistent with Grade I diastolic dysfunction (impaired relaxation).  2. Prominent echogenic structure on the RV free wall, possibly  attached to the TV - measuring about 2.0 cm in diameter and 3.0 cm  in length - some mobile filamentous material is attached, suggestive of thrombus or less likely tumor.. Right ventricular systolic function is normal. The right ventricular size is normal.  3. The mitral valve is grossly normal. Trivial mitral valve regurgitation.  4. The aortic valve is tricuspid. Aortic valve regurgitation is not visualized. Comparison(s): No prior Echocardiogram. Conclusion(s)/Recommendation(s): Findings concerning for RV thrombus, would recommend considering anticoagulation and Transesophageal Echocardiogram or cardiac MRI for further clarification, or repeat echo after trial of anticoagulation. Critical findings reported to Dr. Alvy Bimler and acknowledged at 05/03/2021 at 11:49 am, voicemail left, page sent. FINDINGS  Left Ventricle: Left ventricular ejection fraction, by estimation, is 55 to 60%. The left ventricle has normal function. The left ventricle has no regional wall motion abnormalities. Global longitudinal strain performed but not reported based on interpreter judgement due to suboptimal tracking. The left ventricular internal cavity size was normal in size. There is mild left ventricular hypertrophy. Left ventricular diastolic parameters are consistent with Grade I diastolic dysfunction (impaired relaxation). Indeterminate filling pressures. Right Ventricle: Prominent echogenic structure on the RV free wall, possibly attached to the TV - measuring about 2.0 cm in diameter and 3.0 cm in length - some mobile filamentous material is attached, suggestive of thrombus or less likely tumor. The right ventricular size is normal. No increase in right ventricular wall thickness. Right ventricular systolic function is normal. Left Atrium: Left atrial size was normal in size. Right Atrium: Right atrial size was normal in size. Pericardium: There is no evidence of pericardial effusion. Mitral Valve: The mitral valve is grossly normal. Trivial mitral valve regurgitation. Tricuspid Valve: The  tricuspid valve is grossly normal. Tricuspid valve regurgitation is trivial. Aortic Valve: The aortic valve is tricuspid. Aortic valve regurgitation is not visualized. Aortic valve mean gradient measures 2.0 mmHg. Aortic valve peak gradient measures 3.4 mmHg. Aortic valve area, by VTI measures 1.76 cm. Pulmonic Valve: The pulmonic valve was normal in structure. Pulmonic valve regurgitation is not visualized. Aorta: The aortic root and ascending aorta are structurally normal, with no evidence of dilitation. IAS/Shunts: No atrial level shunt detected by color flow Doppler.  LEFT VENTRICLE PLAX 2D LVIDd:         2.33 cm     Diastology LVIDs:         1.30 cm     LV e' medial:    6.09 cm/s LV PW:         1.46 cm     LV E/e' medial:  7.6 LV IVS:        1.69 cm     LV e' lateral:   6.96 cm/s LVOT diam:     1.50 cm     LV E/e' lateral: 6.7 LV SV:         31 LV SV Index:   19 LVOT Area:     1.77 cm  LV Volumes (MOD) LV vol d, MOD A2C: 22.7 ml LV vol d, MOD A4C: 54.2 ml LV vol s, MOD A2C: 11.4 ml LV vol s, MOD A4C: 24.4 ml LV SV MOD A2C:     11.3 ml LV SV MOD A4C:     54.2 ml LV SV MOD BP:      19.2 ml RIGHT VENTRICLE RV Basal diam:  4.40 cm RV Mid diam:    3.20 cm RV S prime:     7.51 cm/s TAPSE (M-mode): 1.6 cm LEFT ATRIUM  Index       RIGHT ATRIUM           Index LA diam:        2.00 cm 1.21 cm/m  RA Area:     13.30 cm LA Vol (A2C):   17.4 ml 10.50 ml/m RA Volume:   27.90 ml  16.83 ml/m LA Vol (A4C):   25.4 ml 15.32 ml/m LA Biplane Vol: 21.2 ml 12.79 ml/m  AORTIC VALVE                   PULMONIC VALVE AV Area (Vmax):    1.84 cm    PV Vmax:       0.96 m/s AV Area (Vmean):   1.54 cm    PV Vmean:      65.200 cm/s AV Area (VTI):     1.76 cm    PV VTI:        0.249 m AV Vmax:           91.60 cm/s  PV Peak grad:  3.7 mmHg AV Vmean:          67.900 cm/s PV Mean grad:  2.0 mmHg AV VTI:            0.176 m AV Peak Grad:      3.4 mmHg AV Mean Grad:      2.0 mmHg LVOT Vmax:         95.20 cm/s LVOT Vmean:         59.100 cm/s LVOT VTI:          0.175 m LVOT/AV VTI ratio: 0.99  AORTA Ao Root diam: 3.30 cm MITRAL VALVE MV Area (PHT): 2.28 cm    SHUNTS MV Decel Time: 333 msec    Systemic VTI:  0.18 m MV E velocity: 46.50 cm/s  Systemic Diam: 1.50 cm MV A velocity: 58.50 cm/s MV E/A ratio:  0.79 Lyman Bishop MD Electronically signed by Lyman Bishop MD Signature Date/Time: 05/03/2021/11:57:47 AM    Final    VAS [place] LOWER EXTREMITY VENOUS (DVT)  Result Date: 05/04/2021  Lower Venous DVT Study Patient Name:  DEYSI SOLDO  Date of Exam:   05/04/2021 Medical Rec #: [id number]        Accession #:    [id number] Date of Birth: 1951-01-14         Patient Gender: F Patient Age:   70 years Exam Location[facility]  [facility] Procedure:      VAS [place] LOWER EXTREMITY VENOUS (DVT) Referring Phys: Vivia Rosenburg --------------------------------------------------------------------------------  Indications: SOB.  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 Limitations: Poor ultrasound/tissue interface. Comparison Study: No prior study Performing Technologist: Maudry Mayhew MHA, RDMS, RVT, RDCS  Examination Guidelines: A complete evaluation includes B-mode imaging, spectral Doppler, color Doppler, and power Doppler as needed of all accessible portions of each vessel. Bilateral testing is considered an integral part of a complete examination. Limited examinations for reoccurring indications may be performed as noted. The reflux portion of the exam is performed with the patient in reverse Trendelenburg.  +---------+---------------+---------+-----------+----------+--------------+ RIGHT    CompressibilityPhasicitySpontaneityPropertiesThrombus Aging +---------+---------------+---------+-----------+----------+--------------+ CFV      Full           Yes      Yes                                 +---------+---------------+---------+-----------+----------+--------------+ SFJ      Full                                                         +---------+---------------+---------+-----------+----------+--------------+  FV Prox  Full                                                        +---------+---------------+---------+-----------+----------+--------------+ FV Mid   Full                                                        +---------+---------------+---------+-----------+----------+--------------+ FV DistalFull                                                        +---------+---------------+---------+-----------+----------+--------------+ PFV      Full                                                        +---------+---------------+---------+-----------+----------+--------------+ POP      Full           Yes      Yes                                 +---------+---------------+---------+-----------+----------+--------------+ PTV      Full                                                        +---------+---------------+---------+-----------+----------+--------------+ PERO     Full                                                        +---------+---------------+---------+-----------+----------+--------------+   +---------+---------------+---------+-----------+----------+--------------+ LEFT     CompressibilityPhasicitySpontaneityPropertiesThrombus Aging +---------+---------------+---------+-----------+----------+--------------+ CFV      Full           Yes      Yes                                 +---------+---------------+---------+-----------+----------+--------------+ SFJ      Full                                                        +---------+---------------+---------+-----------+----------+--------------+ FV Prox  Full                                                        +---------+---------------+---------+-----------+----------+--------------+  FV Mid   Full                                                         +---------+---------------+---------+-----------+----------+--------------+ FV DistalFull                                                        +---------+---------------+---------+-----------+----------+--------------+ PFV      Full                                                        +---------+---------------+---------+-----------+----------+--------------+ POP      Full           Yes      Yes                                 +---------+---------------+---------+-----------+----------+--------------+ PTV      Full                                                        +---------+---------------+---------+-----------+----------+--------------+ PERO     Full                                                        +---------+---------------+---------+-----------+----------+--------------+     Summary: RIGHT: - There is no evidence of deep vein thrombosis in the lower extremity.  - No cystic structure found in the popliteal fossa.  LEFT: - There is no evidence of deep vein thrombosis in the lower extremity.  - No cystic structure found in the popliteal fossa.  *See table(s) above for measurements and observations.    Preliminary

## 2021-05-04 NOTE — Assessment & Plan Note (Signed)
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

## 2021-05-25 ENCOUNTER — Inpatient Hospital Stay (HOSPITAL_BASED_OUTPATIENT_CLINIC_OR_DEPARTMENT_OTHER): Payer: Medicare HMO | Admitting: Hematology and Oncology

## 2021-05-25 ENCOUNTER — Other Ambulatory Visit: Payer: Self-pay

## 2021-05-25 ENCOUNTER — Inpatient Hospital Stay: Payer: Medicare HMO | Admitting: Dietician

## 2021-05-25 ENCOUNTER — Other Ambulatory Visit (HOSPITAL_COMMUNITY): Payer: Self-pay

## 2021-05-25 ENCOUNTER — Inpatient Hospital Stay: Payer: Medicare HMO

## 2021-05-25 ENCOUNTER — Inpatient Hospital Stay: Payer: Medicare HMO | Attending: Hematology

## 2021-05-25 ENCOUNTER — Encounter: Payer: Self-pay | Admitting: Hematology and Oncology

## 2021-05-25 ENCOUNTER — Other Ambulatory Visit: Payer: Self-pay | Admitting: Hematology and Oncology

## 2021-05-25 DIAGNOSIS — Z923 Personal history of irradiation: Secondary | ICD-10-CM | POA: Diagnosis not present

## 2021-05-25 DIAGNOSIS — C321 Malignant neoplasm of supraglottis: Secondary | ICD-10-CM | POA: Diagnosis not present

## 2021-05-25 DIAGNOSIS — Z7901 Long term (current) use of anticoagulants: Secondary | ICD-10-CM | POA: Insufficient documentation

## 2021-05-25 DIAGNOSIS — I513 Intracardiac thrombosis, not elsewhere classified: Secondary | ICD-10-CM

## 2021-05-25 DIAGNOSIS — E039 Hypothyroidism, unspecified: Secondary | ICD-10-CM | POA: Diagnosis not present

## 2021-05-25 DIAGNOSIS — C329 Malignant neoplasm of larynx, unspecified: Secondary | ICD-10-CM | POA: Diagnosis not present

## 2021-05-25 DIAGNOSIS — Z5112 Encounter for antineoplastic immunotherapy: Secondary | ICD-10-CM | POA: Diagnosis not present

## 2021-05-25 DIAGNOSIS — Z87891 Personal history of nicotine dependence: Secondary | ICD-10-CM | POA: Insufficient documentation

## 2021-05-25 DIAGNOSIS — Z930 Tracheostomy status: Secondary | ICD-10-CM | POA: Diagnosis not present

## 2021-05-25 DIAGNOSIS — G893 Neoplasm related pain (acute) (chronic): Secondary | ICD-10-CM | POA: Diagnosis not present

## 2021-05-25 DIAGNOSIS — Z79899 Other long term (current) drug therapy: Secondary | ICD-10-CM | POA: Insufficient documentation

## 2021-05-25 DIAGNOSIS — Z95828 Presence of other vascular implants and grafts: Secondary | ICD-10-CM

## 2021-05-25 LAB — COMPREHENSIVE METABOLIC PANEL
ALT: 18 U/L (ref 0–44)
AST: 19 U/L (ref 15–41)
Albumin: 4 g/dL (ref 3.5–5.0)
Alkaline Phosphatase: 66 U/L (ref 38–126)
Anion gap: 13 (ref 5–15)
BUN: 22 mg/dL (ref 8–23)
CO2: 25 mmol/L (ref 22–32)
Calcium: 9.4 mg/dL (ref 8.9–10.3)
Chloride: 104 mmol/L (ref 98–111)
Creatinine, Ser: 0.74 mg/dL (ref 0.44–1.00)
GFR, Estimated: >60 mL/min (ref >60)
Glucose, Bld: 99 mg/dL (ref 70–99)
Potassium: 4.2 mmol/L (ref 3.5–5.1)
Sodium: 142 mmol/L (ref 135–145)
Total Bilirubin: 0.4 mg/dL (ref 0.3–1.2)
Total Protein: 7.3 g/dL (ref 6.5–8.1)

## 2021-05-25 LAB — CBC WITH DIFFERENTIAL/PLATELET
Abs Immature Granulocytes: 0.01 10*3/uL (ref 0.00–0.07)
Basophils Absolute: 0 10*3/uL (ref 0.0–0.1)
Basophils Relative: 0 %
Eosinophils Absolute: 0.3 10*3/uL (ref 0.0–0.5)
Eosinophils Relative: 7 %
HCT: 40.3 % (ref 36.0–46.0)
Hemoglobin: 13.4 g/dL (ref 12.0–15.0)
Immature Granulocytes: 0 %
Lymphocytes Relative: 29 %
Lymphs Abs: 1.3 10*3/uL (ref 0.7–4.0)
MCH: 28.8 pg (ref 26.0–34.0)
MCHC: 33.3 g/dL (ref 30.0–36.0)
MCV: 86.7 fL (ref 80.0–100.0)
Monocytes Absolute: 0.5 10*3/uL (ref 0.1–1.0)
Monocytes Relative: 12 %
Neutro Abs: 2.3 10*3/uL (ref 1.7–7.7)
Neutrophils Relative %: 52 %
Platelets: 217 10*3/uL (ref 150–400)
RBC: 4.65 MIL/uL (ref 3.87–5.11)
RDW: 13.2 % (ref 11.5–15.5)
WBC: 4.3 10*3/uL (ref 4.0–10.5)
nRBC: 0 % (ref 0.0–0.2)

## 2021-05-25 LAB — TSH: TSH: 1.217 u[IU]/mL (ref 0.308–3.960)

## 2021-05-25 MED ORDER — HEPARIN SOD (PORK) LOCK FLUSH 100 UNIT/ML IV SOLN
500.0000 [IU] | Freq: Once | INTRAVENOUS | Status: AC | PRN
  Administered 2021-05-25: 500 [IU]

## 2021-05-25 MED ORDER — SODIUM CHLORIDE 0.9% FLUSH
10.0000 mL | INTRAVENOUS | Status: DC | PRN
  Administered 2021-05-25: 10 mL

## 2021-05-25 MED ORDER — APIXABAN 5 MG PO TABS
5.0000 mg | ORAL_TABLET | Freq: Two times a day (BID) | ORAL | 3 refills | Status: DC
Start: 2021-05-25 — End: 2021-06-15
  Filled 2021-05-25: qty 60, 30d supply, fill #0

## 2021-05-25 MED ORDER — SODIUM CHLORIDE 0.9 % IV SOLN
200.0000 mg | Freq: Once | INTRAVENOUS | Status: AC
  Administered 2021-05-25: 200 mg via INTRAVENOUS
  Filled 2021-05-25: qty 8

## 2021-05-25 MED ORDER — SODIUM CHLORIDE 0.9 % IV SOLN: Freq: Once | INTRAVENOUS | Status: AC

## 2021-05-25 MED ORDER — SODIUM CHLORIDE 0.9% FLUSH
10.0000 mL | Freq: Once | INTRAVENOUS | Status: AC
  Administered 2021-05-25: 10 mL

## 2021-05-25 MED ORDER — HYDROCODONE-ACETAMINOPHEN 5-325 MG PO TABS
1.0000 | ORAL_TABLET | Freq: Four times a day (QID) | ORAL | 0 refills | Status: DC | PRN
  Filled 2021-05-25: qty 60, 15d supply, fill #0

## 2021-05-25 NOTE — Assessment & Plan Note (Signed)
She is able to reduce the amount of pain medicine required She will continue pain medicine taper as tolerated I have refilled her prescription pain medicine today

## 2021-05-25 NOTE — Patient Instructions (Signed)
[facility]  Discharge Instructions: ?Thank you for choosing [facility] to provide your oncology and hematology care.  ? ?If you have a lab appointment with the Cancer Center, please go directly to the Cancer Center and check in at the registration area. ?  ?Wear comfortable clothing and clothing appropriate for easy access to any Portacath or PICC line.  ? ?We strive to give you quality time with your provider. You may need to reschedule your appointment if you arrive late (15 or more minutes).  Arriving late affects you and other patients whose appointments are after yours.  Also, if you miss three or more appointments without notifying the office, you may be dismissed from the clinic at the provider?s discretion.    ?  ?For prescription refill requests, have your pharmacy contact our office and allow 72 hours for refills to be completed.   ? ?Today you received the following chemotherapy and/or immunotherapy agents: Keytruda ?  ?To help prevent nausea and vomiting after your treatment, we encourage you to take your nausea medication as directed. ? ?BELOW ARE SYMPTOMS THAT SHOULD BE REPORTED IMMEDIATELY: ?*FEVER GREATER THAN 100.4 F (38 ?C) OR HIGHER ?*CHILLS OR SWEATING ?*NAUSEA AND VOMITING THAT IS NOT CONTROLLED WITH YOUR NAUSEA MEDICATION ?*UNUSUAL SHORTNESS OF BREATH ?*UNUSUAL BRUISING OR BLEEDING ?*URINARY PROBLEMS (pain or burning when urinating, or frequent urination) ?*BOWEL PROBLEMS (unusual diarrhea, constipation, pain near the anus) ?TENDERNESS IN MOUTH AND THROAT WITH OR WITHOUT PRESENCE OF ULCERS (sore throat, sores in mouth, or a toothache) ?UNUSUAL RASH, SWELLING OR PAIN  ?UNUSUAL VAGINAL DISCHARGE OR ITCHING  ? ?Items with * indicate a potential emergency and should be followed up as soon as possible or go to the Emergency Department if any problems should occur. ? ?Please show the CHEMOTHERAPY ALERT CARD or IMMUNOTHERAPY ALERT CARD at check-in to the  Emergency Department and triage nurse. ? ?Should you have questions after your visit or need to cancel or reschedule your appointment, please contact [facility]  Dept: [phone]  and follow the prompts.  Office hours are 8:00 a.m. to 4:30 p.m. Monday - Friday. Please note that voicemails left after 4:00 p.m. may not be returned until the following business day.  We are closed weekends and major holidays. You have access to a nurse at all times for urgent questions. Please call the main number to the clinic Dept: [phone] and follow the prompts. ? ? ?For any non-urgent questions, you may also contact your provider using MyChart. We now offer e-Visits for anyone 18 and older to request care online for non-urgent symptoms. For details visit [email or web address]. ?  ?Also download the MyChart app! Go to the app store, search "MyChart", open the app, select [facility], and log in with your MyChart username and password. ? ?Due to Covid, a mask is required upon entering the hospital/clinic. If you do not have a mask, one will be given to you upon arrival. For doctor visits, patients may have 1 support person aged 18 or older with them. For treatment visits, patients cannot have anyone with them due to current Covid guidelines and our immunocompromised population.  ? ?

## 2021-05-25 NOTE — Assessment & Plan Note (Addendum)
She is doing very well with anticoagulation therapy I plan to repeat imaging study in November

## 2021-05-25 NOTE — Progress Notes (Signed)
[facility] OFFICE PROGRESS NOTE  Patient Care Team: Hamrick, Lorin Mercy, MD as PCP - General (Family Medicine) Heath Lark, MD as Consulting Physician (Hematology and Oncology)  ASSESSMENT & PLAN:  Laryngeal squamous cell carcinoma [facility]) Her imaging studies showed she has excellent response to immunotherapy and no signs of progression The plan will be to continue pembrolizumab indefinitely She is in agreement I do not plan to repeat imaging study again until November  Cancer associated pain She is able to reduce the amount of pain medicine required She will continue pain medicine taper as tolerated I have refilled her prescription pain medicine today  RV (right ventricular) mural thrombus She is doing very well with anticoagulation therapy I plan to repeat imaging study in November  No orders of the defined types were placed in this encounter.   All questions were answered. The patient knows to call the clinic with any problems, questions or concerns. The total time spent in the appointment was 20 minutes encounter with patients including review of chart and various tests results, discussions about plan of care and coordination of care plan   Heath Lark, MD 05/25/2021 8:47 AM  INTERVAL HISTORY: Please see below for problem oriented charting. she returns for treatment follow-up with her son for Beryle Flock She is doing well No bleeding complications Her chronic pain is stable She is managing her feeding tube will  REVIEW OF SYSTEMS:   Constitutional: Denies fevers, chills or abnormal weight loss Eyes: Denies blurriness of vision Ears, nose, mouth, throat, and face: Denies mucositis or sore throat Respiratory: Denies cough, dyspnea or wheezes Cardiovascular: Denies palpitation, chest discomfort or lower extremity swelling Gastrointestinal:  Denies nausea, heartburn or change in bowel habits Skin: Denies abnormal skin rashes Lymphatics: Denies new lymphadenopathy  or easy bruising Neurological:Denies numbness, tingling or new weaknesses Behavioral/Psych: Mood is stable, no new changes  All other systems were reviewed with the patient and are negative.  I have reviewed the past medical history, past surgical history, social history and family history with the patient and they are unchanged from previous note.  ALLERGIES:  has No Known Allergies.  MEDICATIONS:  Current Outpatient Medications  Medication Sig Dispense Refill   apixaban (ELIQUIS) 5 MG TABS tablet Take 1 tablet (5 mg total) by mouth 2 (two) times daily. 60 tablet 3   chlorhexidine (PERIDEX) 0.12 % solution Use as directed 15 mLs in the mouth or throat 2 (two) times daily. Rinse with 15 mls twice daily for 30 seconds. Use after breakfast and at bedtime. Spit out excess. Do not swallow. 473 mL 2   Cholecalciferol (VITAMIN D3) 125 MCG (5000 UT) CAPS Take 5,000 Units by mouth daily.     HYDROcodone-acetaminophen (NORCO/VICODIN) 5-325 MG tablet TAKE 1 TABLET BY MOUTH EVERY 6 HOURS AS NEEDED FOR MODERATE PAIN 60 tablet 0   levothyroxine (SYNTHROID) 137 MCG tablet Take 137 mcg by mouth daily before breakfast.     lidocaine-prilocaine (EMLA) cream Apply topically daily as needed. 30 g 9   Nutritional Supplements (KATE FARMS PEPTIDE 1.5) LIQD Take 4 Bottles by mouth daily. 325 mL 11   nystatin (MYCOSTATIN) 100000 UNIT/ML suspension Take 5 mLs by mouth 2 (two) times daily.     ondansetron (ZOFRAN) 8 MG tablet Take 1 tablet (8 mg total) by mouth 2 (two) times daily as needed (Nausea or vomiting). (Patient taking differently: Take 8 mg by mouth 2 (two) times daily as needed for nausea or vomiting (Nausea or vomiting).) 30 tablet 1  oxymetazoline (AFRIN) 0.05 % nasal spray Place 3 sprays into both nostrils 2 (two) times daily as needed (Bleeding from stoma). 30 mL 0   prochlorperazine (COMPAZINE) 10 MG tablet Take 1 tablet (10 mg total) by mouth every 6 (six) hours as needed (Nausea or vomiting). (Patient  taking differently: Take 10 mg by mouth every 6 (six) hours as needed for nausea or vomiting (Nausea or vomiting).) 30 tablet 1   No current facility-administered medications for this visit.    SUMMARY OF ONCOLOGIC HISTORY: Oncology History  Laryngeal squamous cell carcinoma ([place])  12/02/2018 Imaging   CT neck: IMPRESSION: 1. Supraglottic laryngeal carcinoma extending across the anterior commissure and invading the prelaryngeal fat, left aryepiglottic fold and left aspect of the epiglottis. 2. Severe narrowing of the laryngeal airway. 3. Bilateral subcentimeter level 2A and 2A cervical lymph nodes.     12/02/2018 Imaging   CT  Chest:  IMPRESSION: No evidence of metastatic disease in the chest.   12/03/2018 Pathology Results   (Care Everywhere; [facility]) A: Larynx, supraglottis, biopsy - Atypical squamous proliferation with at least low grade dysplasia and abundant keratinization - No definite high grade dysplasia or invasive carcinoma represented (see comment)   12/18/2018 Pathology Results   (Care Everywhere; [name]) A:  Neck, left, fine needle aspiration - Rare single atypical cells, no cells diagnostic of malignancy - Lymph node material present  B:  Neck, right, fine needle aspiration - Non-diagnostic specimen - No lymph node material identified - Mixed inflammation and blood   12/31/2018 Pathology Results   (Care Everywhere; [facility])  A: Larynx, left supraglottic mass, biopsy - Atypical squamous proliferation with abundant keratinization, suspicious for well-differentiated invasive squamous cell carcinoma  B: Larynx, left supraglottic mass, biopsy - Well-differentiated invasive squamous cell carcinoma with abundant keratinization (see comment)   01/18/2019 Initial Diagnosis   Laryngeal cancer ([place])   01/26/2019 Imaging   1. Locally advanced hypermetabolic laryngeal neoplasm centered in the left supraglottic space with bilateral glottic and epiglottic  involvement and subglottic extension on the left. 2. Hypermetabolic bilateral level 3 neck nodal metastases. 3. Hypermetabolic high mediastinal nodal metastases between the trachea and upper thoracic esophagus. 4. Otherwise no distant hypermetabolic metastatic disease. 5. Nodular 7 mm focus at the left lung base, below PET resolution, recommend attention on follow-up chest CT in 3 months. 6. Chronic findings include: Aortic Atherosclerosis (ICD10-I70.0). Cholelithiasis.   02/08/2019 Cancer Staging   Staging form: Larynx - Supraglottis, AJCC 8th Edition - Clinical stage from 02/08/2019: Stage IVA (cT3, cN2c, cM0) - Signed by Eppie Gibson, MD on 02/08/2019   02/11/2019 - 04/07/2019 Chemotherapy   The patient weekly cisplatin for chemotherapy treatment.     07/20/2019 Pathology Results   PROCEDURE: Total glossectomy, total pharyngectomy, total laryngectomy, total thyroidectomy, and cervical esophagectomy TUMOR SITE: Larynx TUMOR LATERALITY: Not specified TUMOR FOCALITY: Unifocal TUMOR SIZE:    GREATEST DIMENSION: 6.9 cm    ADDITIONAL DIMENSIONS: 5.4 x 2.4 cm HISTOLOGIC TYPE: Squamous cell carcinoma, conventional (keratinizing) HISTOLOGIC GRADE:  G1-G2: Well to moderately differentiated TUMOR EXTENSION: Tumor involves the posterior tongue, pharynx, larynx, tracheal cartilage, hyoid bone, cervical esophagus, vagus nerve, carotid artery, deep cervical fascia, ans surrounding skeletal muscle and soft tissue MARGINS: Uninvolved by invasive tumor; Perineural invasion is less than 104mm from the left soft tissue margin LYMPHOVASCULAR INVASION: Not identified PERINEURAL INVASION:  Present REGIONAL LYMPH NODES:     NUMBER OF LYMPH NODES INVOLVED: 2     NUMBER OF LYMPH NODES EXAMINED: 24  LATERALITY OF LYMPH NODES INVOLVED: Cannot determine     SIZE OF LARGEST METASTATIC DEPOSIT: 0.8 cm     EXTRANODAL EXTENSION: Not identified PATHOLOGIC STAGE CLASSIFICATION (pTNM, AJCC 8TH Ed): (r)(y)  pT4b pN2a DISTANT METASTASIS (pM): ADDITIONAL PATHOLOGIC FINDINGS: Previous chemoradiation   11/04/2019 Imaging   PET: 1.  Hypermetabolic bilateral neck lesions as detailed above are concerning for disease recurrence. 2.  Compared to 07/08/2019 PET scan, interval development of subcentimeter bilateral upper lobe pulmonary nodules, size below PET resolution. These are concerning for metastasis. Consider diagnostic quality CT chest exam for further evaluation as per oncology treatment planning. 3.  Focal hypermetabolic uptake in right ventricular cavity of indeterminate significance. Recommend echocardiographic examination to rule out underlying thrombus. This finding was discussed with Dr. Conley Canal by Dr. Clementeen Graham and Dr. Marcello Moores on 11/04/2019 at approximately 3:00 PM via telephone. 4.  Ancillary CT findings as above.   11/05/2019 Imaging   Echocardiogram: Summary   1. The left ventricle is normal in size with normal wall thickness.   2. The left ventricular systolic function is normal, LVEF is visually estimated at 60-65%.   3. The right ventricle is normal in size, with normal systolic function.   4. There is an extrinsic compression of the RV and RA laterally at the AV groove by a mass of 4.3 cm diameter. Unclear if the mass is invasive, although no pericardial effusion seen suggestive no invasion possibly. There is no evidence of hemodynamic compromise secondary to cardiac compression with normal IVC diameter.   12/06/2019 Pathology Results   A. LYMPH NODE, LEFT SUBMANDIBULAR, BIOPSY:  - Squamous cell carcinoma.  - No distinct nodal tissue identified.    12/06/2019 Procedure   Successful ultrasound-guided core biopsy of centrally necrotic left submandibular lymph node. Of note, due to extensive prior postsurgical and post radiation changes, patient has very limited mobility in the neck and the biopsy was extremely challenging    12/16/2019 -  Chemotherapy   The patient had pembrolizumab  for chemotherapy treatment.     03/08/2020 PET scan   1. Diminished size of masses in the neck as described. Persistent increased metabolic activity particularly in the LEFT neck. Some of this diminished size on the LEFT could be due to decompression of necrotic material. Correlate with any symptoms of inflammation or infection in this area as well given the paucity of residual soft tissue associated with the above abnormality, overall findings are compatible with residual disease in this location and there is no significant surrounding stranding to indicate inflammation that would correlate with the degree of FDG uptake remaining. 2. Diminished activity in the juxta cardiac mass with peripheral activity suggesting interval necrosis, on the prior study this was uniformly hypermetabolic. There is still considerable FDG uptake. 3. No signs of disease in the abdomen or pelvis. 4. 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 Correlate with any new pain or symptoms in this area with further imaging as warranted. No discrete mass or bone destruction on today's study.   06/08/2020 Imaging   CT neck 1. Status post laryngectomy and neck dissection without lymphadenopathy or mass lesion. 2. Short segment severe stenosis of the left internal carotid artery adjacent to a surgical clip.     06/08/2020 Imaging   CT chest 1. There is a large, hypodense lesion within, or very closely abutting and likely intruding upon the right ventricle, as seen on prior examination and approximately 4.6 x 4.3 cm, poorly evaluated due to cardiac motion. Findings are consistent with metastatic  lesion and/or thrombus and poorly assessed by ungated CT. Consider echocardiography and cardiac MRI for further assessment.   2. Multiple small pulmonary nodules in the bilateral lung apices are unchanged. Attention on follow-up.   3.  No evidence of new metastatic disease in the chest.   4.  Aortic Atherosclerosis (ICD10-I70.0).      09/07/2020 Imaging   1. Mild decrease in size of juxta cardiac metastatic involving the right ventricle. 2. Small pulmonary nodules are unchanged from previous exam. 3. Aortic atherosclerosis. 4. Gallstone.     12/14/2020 Procedure   Successful exchange and up size from 20 French pull-through gastrostomy to 22 French balloon retention gastrostomy.   03/30/2021 Imaging   Ct neck 1.  In comparison with PET/CT from February 2021, marked interval decrease in size of soft tissue mass along the angle of the left mandible consistent with a positive treatment response. However, there are new destructive bony changes along the lingual surface of the mandible in this region with appearance of soft tissue infiltrating the marrow cavity. While concerning for neoplastic involvement, changes could also represent other nonspecific osteonecrosis including infection or sequela of prior radiation treatment. Recommend correlation with direct visual inspection. Further evaluation with MRI may be of value.  2.  Centrally necrotic 1.3 cm right level IIa lymph node corresponding with previously described hypermetabolic lesion, decreased in size from prior. No suspicious new or enlarged lymph nodes in the neck.  3.  Enlarged right lower paratracheal lymph node suspected in the mediastinum, incompletely imaged. Consider dedicated CT imaging of the chest.  4.  Scattered pulmonary nodules in the imaged lung apices, the largest a 9 mm lesion in the right upper lobe. Recommend comparison with outside imaging ofthe chest if available.   05/03/2021 Echocardiogram    1. Left ventricular ejection fraction, by estimation, is 55 to 60%. The left ventricle has normal function. The left ventricle has no regional wall motion abnormalities. There is mild left ventricular hypertrophy. Left ventricular diastolic parameters are consistent with Grade I diastolic dysfunction (impaired relaxation).  2. Prominent echogenic structure on the RV  free wall, possibly attached to the TV - measuring about 2.0 cm in diameter and 3.0 cm in length - some mobile filamentous material is attached, suggestive of thrombus or less likely tumor.. Right ventricular systolic function is normal. The right ventricular size is normal.  3. The mitral valve is grossly normal. Trivial mitral valve regurgitation.  4. The aortic valve is tricuspid. Aortic valve regurgitation is not visualized.     05/04/2021 Imaging   RIGHT: - There is no evidence of deep vein thrombosis in the lower extremity.   - No cystic structure found in the popliteal fossa.   LEFT: - There is no evidence of deep vein thrombosis in the lower extremity.   - No cystic structure found in the popliteal fossa.       PHYSICAL EXAMINATION: ECOG PERFORMANCE STATUS: 1 - Symptomatic but completely ambulatory  Vitals:   05/25/21 0821  BP: 109/73  Pulse: 87  Resp: 18  Temp: (!) 97.5 F (36.4 C)  SpO2: 98%   Filed Weights   05/25/21 0821  Weight: 143 lb 12.8 oz (65.2 kg)    GENERAL:alert, no distress and comfortable SKIN: skin color, texture, turgor are normal, no rashes or significant lesions EYES: normal, Conjunctiva are pink and non-injected, sclera clear OROPHARYNX:no exudate, no erythema and lips, buccal mucosa, and tongue normal  NECK: Tracheostomy in situ LYMPH:  no palpable lymphadenopathy in the  cervical, axillary or inguinal LUNGS: clear to auscultation and percussion with normal breathing effort HEART: regular rate & rhythm and no murmurs and no lower extremity edema ABDOMEN:abdomen soft, non-tender and normal bowel sounds.  Feeding tube looks normal Musculoskeletal:no cyanosis of digits and no clubbing  NEURO: alert & oriented x 3, no focal motor/sensory deficits  LABORATORY DATA:  I have reviewed the data as listed    Component Value Date/Time   NA 142 05/25/2021 0759   K 4.2 05/25/2021 0759   CL 104 05/25/2021 0759   CO2 25 05/25/2021 0759   GLUCOSE 99  05/25/2021 0759   BUN 22 05/25/2021 0759   CREATININE 0.74 05/25/2021 0759   CREATININE 0.71 04/13/2021 0827   CALCIUM 9.4 05/25/2021 0759   PROT 7.3 05/25/2021 0759   ALBUMIN 4.0 05/25/2021 0759   AST 19 05/25/2021 0759   AST 22 04/13/2021 0827   ALT 18 05/25/2021 0759   ALT 17 04/13/2021 0827   ALKPHOS 66 05/25/2021 0759   BILITOT 0.4 05/25/2021 0759   BILITOT 0.5 04/13/2021 0827   GFRNONAA >60 05/25/2021 0759   GFRNONAA >60 04/13/2021 0827   GFRAA >60 06/08/2020 1303   GFRAA >60 02/17/2020 1128    No results found for: SPEP, UPEP  Lab Results  Component Value Date   WBC 4.3 05/25/2021   NEUTROABS 2.3 05/25/2021   HGB 13.4 05/25/2021   HCT 40.3 05/25/2021   MCV 86.7 05/25/2021   PLT 217 05/25/2021      Chemistry      Component Value Date/Time   NA 142 05/25/2021 0759   K 4.2 05/25/2021 0759   CL 104 05/25/2021 0759   CO2 25 05/25/2021 0759   BUN 22 05/25/2021 0759   CREATININE 0.74 05/25/2021 0759   CREATININE 0.71 04/13/2021 0827      Component Value Date/Time   CALCIUM 9.4 05/25/2021 0759   ALKPHOS 66 05/25/2021 0759   AST 19 05/25/2021 0759   AST 22 04/13/2021 0827   ALT 18 05/25/2021 0759   ALT 17 04/13/2021 0827   BILITOT 0.4 05/25/2021 0759   BILITOT 0.5 04/13/2021 0827

## 2021-05-25 NOTE — Progress Notes (Signed)
Nutrition    Unable to meet with patient during infusion for nutrition follow-up. Patient completed treatment early today. Spoke with RN, reports patient arrived early and Keytruda infusion completed ~30 minutes. Weight 143 lb 12.8 oz today stable. Have rescheduled nutrition follow-up during next infusion on 9/23.

## 2021-05-25 NOTE — Assessment & Plan Note (Signed)
Her imaging studies showed she has excellent response to immunotherapy and no signs of progression The plan will be to continue pembrolizumab indefinitely She is in agreement I do not plan to repeat imaging study again until November

## 2021-05-28 ENCOUNTER — Other Ambulatory Visit (HOSPITAL_COMMUNITY): Payer: Self-pay

## 2021-05-29 ENCOUNTER — Other Ambulatory Visit (HOSPITAL_COMMUNITY): Payer: Self-pay

## 2021-06-05 DIAGNOSIS — Z008 Encounter for other general examination: Secondary | ICD-10-CM | POA: Diagnosis not present

## 2021-06-05 DIAGNOSIS — R69 Illness, unspecified: Secondary | ICD-10-CM | POA: Diagnosis not present

## 2021-06-05 DIAGNOSIS — D8481 Immunodeficiency due to conditions classified elsewhere: Secondary | ICD-10-CM | POA: Diagnosis not present

## 2021-06-05 DIAGNOSIS — E039 Hypothyroidism, unspecified: Secondary | ICD-10-CM | POA: Diagnosis not present

## 2021-06-05 DIAGNOSIS — C140 Malignant neoplasm of pharynx, unspecified: Secondary | ICD-10-CM | POA: Diagnosis not present

## 2021-06-05 DIAGNOSIS — D84821 Immunodeficiency due to drugs: Secondary | ICD-10-CM | POA: Diagnosis not present

## 2021-06-05 DIAGNOSIS — G8929 Other chronic pain: Secondary | ICD-10-CM | POA: Diagnosis not present

## 2021-06-05 DIAGNOSIS — Z930 Tracheostomy status: Secondary | ICD-10-CM | POA: Diagnosis not present

## 2021-06-05 DIAGNOSIS — C7989 Secondary malignant neoplasm of other specified sites: Secondary | ICD-10-CM | POA: Diagnosis not present

## 2021-06-05 DIAGNOSIS — C7800 Secondary malignant neoplasm of unspecified lung: Secondary | ICD-10-CM | POA: Diagnosis not present

## 2021-06-05 DIAGNOSIS — Z931 Gastrostomy status: Secondary | ICD-10-CM | POA: Diagnosis not present

## 2021-06-14 DIAGNOSIS — R633 Feeding difficulties, unspecified: Secondary | ICD-10-CM | POA: Diagnosis not present

## 2021-06-14 DIAGNOSIS — C321 Malignant neoplasm of supraglottis: Secondary | ICD-10-CM | POA: Diagnosis not present

## 2021-06-15 ENCOUNTER — Inpatient Hospital Stay: Payer: Medicare HMO

## 2021-06-15 ENCOUNTER — Other Ambulatory Visit (HOSPITAL_COMMUNITY): Payer: Self-pay

## 2021-06-15 ENCOUNTER — Inpatient Hospital Stay (HOSPITAL_BASED_OUTPATIENT_CLINIC_OR_DEPARTMENT_OTHER): Payer: Medicare HMO | Admitting: Hematology and Oncology

## 2021-06-15 ENCOUNTER — Ambulatory Visit (HOSPITAL_COMMUNITY)
Admission: RE | Admit: 2021-06-15 | Discharge: 2021-06-15 | Disposition: A | Discharge status: Home / Self Care | Payer: Medicare HMO | Source: Ambulatory Visit | Attending: Hematology and Oncology | Admitting: Hematology and Oncology

## 2021-06-15 ENCOUNTER — Inpatient Hospital Stay: Payer: Medicare HMO | Admitting: Dietician

## 2021-06-15 ENCOUNTER — Encounter: Payer: Self-pay | Admitting: Hematology and Oncology

## 2021-06-15 ENCOUNTER — Other Ambulatory Visit: Payer: Self-pay

## 2021-06-15 VITALS — BP 109/63 | HR 85 | Temp 97.4°F | Resp 18 | Ht 62.0 in | Wt 144.2 lb

## 2021-06-15 DIAGNOSIS — G893 Neoplasm related pain (acute) (chronic): Secondary | ICD-10-CM | POA: Diagnosis not present

## 2021-06-15 DIAGNOSIS — C321 Malignant neoplasm of supraglottis: Secondary | ICD-10-CM

## 2021-06-15 DIAGNOSIS — C7800 Secondary malignant neoplasm of unspecified lung: Secondary | ICD-10-CM

## 2021-06-15 DIAGNOSIS — Z7901 Long term (current) use of anticoagulants: Secondary | ICD-10-CM | POA: Diagnosis not present

## 2021-06-15 DIAGNOSIS — Z87891 Personal history of nicotine dependence: Secondary | ICD-10-CM | POA: Diagnosis not present

## 2021-06-15 DIAGNOSIS — Z79899 Other long term (current) drug therapy: Secondary | ICD-10-CM | POA: Diagnosis not present

## 2021-06-15 DIAGNOSIS — Z930 Tracheostomy status: Secondary | ICD-10-CM | POA: Diagnosis not present

## 2021-06-15 DIAGNOSIS — E039 Hypothyroidism, unspecified: Secondary | ICD-10-CM

## 2021-06-15 DIAGNOSIS — Z430 Encounter for attention to tracheostomy: Secondary | ICD-10-CM | POA: Diagnosis not present

## 2021-06-15 DIAGNOSIS — Z931 Gastrostomy status: Secondary | ICD-10-CM | POA: Diagnosis not present

## 2021-06-15 DIAGNOSIS — I513 Intracardiac thrombosis, not elsewhere classified: Secondary | ICD-10-CM | POA: Diagnosis not present

## 2021-06-15 DIAGNOSIS — C329 Malignant neoplasm of larynx, unspecified: Secondary | ICD-10-CM

## 2021-06-15 DIAGNOSIS — Z5112 Encounter for antineoplastic immunotherapy: Secondary | ICD-10-CM | POA: Diagnosis not present

## 2021-06-15 DIAGNOSIS — Z923 Personal history of irradiation: Secondary | ICD-10-CM | POA: Diagnosis not present

## 2021-06-15 DIAGNOSIS — R633 Feeding difficulties, unspecified: Secondary | ICD-10-CM | POA: Diagnosis not present

## 2021-06-15 DIAGNOSIS — Z95828 Presence of other vascular implants and grafts: Secondary | ICD-10-CM

## 2021-06-15 LAB — COMPREHENSIVE METABOLIC PANEL
ALT: 13 U/L (ref 0–44)
AST: 18 U/L (ref 15–41)
Albumin: 4 g/dL (ref 3.5–5.0)
Alkaline Phosphatase: 69 U/L (ref 38–126)
Anion gap: 10 (ref 5–15)
BUN: 24 mg/dL — ABNORMAL HIGH (ref 8–23)
CO2: 26 mmol/L (ref 22–32)
Calcium: 9.2 mg/dL (ref 8.9–10.3)
Chloride: 104 mmol/L (ref 98–111)
Creatinine, Ser: 0.77 mg/dL (ref 0.44–1.00)
GFR, Estimated: >60 mL/min (ref >60)
Glucose, Bld: 98 mg/dL (ref 70–99)
Potassium: 4.1 mmol/L (ref 3.5–5.1)
Sodium: 140 mmol/L (ref 135–145)
Total Bilirubin: 0.4 mg/dL (ref 0.3–1.2)
Total Protein: 7.2 g/dL (ref 6.5–8.1)

## 2021-06-15 LAB — CBC WITH DIFFERENTIAL/PLATELET
Abs Immature Granulocytes: 0.01 10*3/uL (ref 0.00–0.07)
Basophils Absolute: 0 10*3/uL (ref 0.0–0.1)
Basophils Relative: 0 %
Eosinophils Absolute: 0.2 10*3/uL (ref 0.0–0.5)
Eosinophils Relative: 6 %
HCT: 39.1 % (ref 36.0–46.0)
Hemoglobin: 13.1 g/dL (ref 12.0–15.0)
Immature Granulocytes: 0 %
Lymphocytes Relative: 25 %
Lymphs Abs: 0.9 10*3/uL (ref 0.7–4.0)
MCH: 28.9 pg (ref 26.0–34.0)
MCHC: 33.5 g/dL (ref 30.0–36.0)
MCV: 86.3 fL (ref 80.0–100.0)
Monocytes Absolute: 0.5 10*3/uL (ref 0.1–1.0)
Monocytes Relative: 12 %
Neutro Abs: 2.1 10*3/uL (ref 1.7–7.7)
Neutrophils Relative %: 57 %
Platelets: 216 10*3/uL (ref 150–400)
RBC: 4.53 MIL/uL (ref 3.87–5.11)
RDW: 13.1 % (ref 11.5–15.5)
WBC: 3.8 10*3/uL — ABNORMAL LOW (ref 4.0–10.5)
nRBC: 0 % (ref 0.0–0.2)

## 2021-06-15 LAB — TSH: TSH: 1.13 u[IU]/mL (ref 0.308–3.960)

## 2021-06-15 MED ORDER — HYDROCODONE-ACETAMINOPHEN 5-325 MG PO TABS
1.0000 | ORAL_TABLET | Freq: Four times a day (QID) | ORAL | 0 refills | Status: DC | PRN
  Filled 2021-06-15: qty 60, 15d supply, fill #0

## 2021-06-15 MED ORDER — SODIUM CHLORIDE 0.9% FLUSH
10.0000 mL | INTRAVENOUS | Status: DC | PRN
  Administered 2021-06-15: 10 mL

## 2021-06-15 MED ORDER — APIXABAN 5 MG PO TABS
5.0000 mg | ORAL_TABLET | Freq: Two times a day (BID) | ORAL | 3 refills | Status: DC
  Filled 2021-06-15: qty 60, 30d supply, fill #0

## 2021-06-15 MED ORDER — SODIUM CHLORIDE 0.9% FLUSH
10.0000 mL | Freq: Once | INTRAVENOUS | Status: AC
  Administered 2021-06-15: 10 mL

## 2021-06-15 MED ORDER — HEPARIN SOD (PORK) LOCK FLUSH 100 UNIT/ML IV SOLN
500.0000 [IU] | Freq: Once | INTRAVENOUS | Status: AC | PRN
  Administered 2021-06-15: 500 [IU]

## 2021-06-15 MED ORDER — SODIUM CHLORIDE 0.9 % IV SOLN: Freq: Once | INTRAVENOUS | Status: AC

## 2021-06-15 MED ORDER — SODIUM CHLORIDE 0.9 % IV SOLN
200.0000 mg | Freq: Once | INTRAVENOUS | Status: AC
  Administered 2021-06-15: 200 mg via INTRAVENOUS
  Filled 2021-06-15: qty 8

## 2021-06-15 NOTE — Assessment & Plan Note (Signed)
Her imaging studies showed she has excellent response to immunotherapy and no signs of progression The plan will be to continue pembrolizumab indefinitely She is in agreement I plan to repeat CT imaging next month for further follow-up

## 2021-06-15 NOTE — Assessment & Plan Note (Signed)
She has slight increased bleeding likely due to side effects of anticoagulation therapy If her bleeding gets worse, I plan to reduce the dose of Eliquis

## 2021-06-15 NOTE — Patient Instructions (Signed)
[facility] ONCOLOG[facility]  Discharge Instructions: Thank you for choosing [facility] to provide your oncology and hematology care.   If you have a lab appointment with the [facility], please go directly to the [facility] and check in at the registration area.   Wear comfortable clothing and clothing appropriate for easy access to any Portacath or PICC line.   We strive to give you quality time with your provider. You may need to reschedule your appointment if you arrive late (15 or more minutes).  Arriving late affects you and other patients whose appointments are after yours.  Also, if you miss three or more appointments without notifying the office, you may be dismissed from the clinic at the provider's discretion.      For prescription refill requests, have your pharmacy contact our office and allow 72 hours for refills to be completed.    Today you received the following chemotherapy and/or immunotherapy agents pembrolizumab       To help prevent nausea and vomiting after your treatment, we encourage you to take your nausea medication as directed.  BELOW ARE SYMPTOMS THAT SHOULD BE REPORTED IMMEDIATELY: *FEVER GREATER THAN 100.4 F (38 C) OR HIGHER *CHILLS OR SWEATING *NAUSEA AND VOMITING THAT IS NOT CONTROLLED WITH YOUR NAUSEA MEDICATION *UNUSUAL SHORTNESS OF BREATH *UNUSUAL BRUISING OR BLEEDING *URINARY PROBLEMS (pain or burning when urinating, or frequent urination) *BOWEL PROBLEMS (unusual diarrhea, constipation, pain near the anus) TENDERNESS IN MOUTH AND THROAT WITH OR WITHOUT PRESENCE OF ULCERS (sore throat, sores in mouth, or a toothache) UNUSUAL RASH, SWELLING OR PAIN  UNUSUAL VAGINAL DISCHARGE OR ITCHING   Items with * indicate a potential emergency and should be followed up as soon as possible or go to the Emergency Department if any problems should occur.  Please show the CHEMOTHERAPY ALERT CARD or IMMUNOTHERAPY ALERT CARD at  check-in to the Emergency Department and triage nurse.  Should you have questions after your visit or need to cancel or reschedule your appointment, please contact [facility]  Dept: [phone]  and follow the prompts.  Office hours are 8:00 a.m. to 4:30 p.m. Monday - Friday. Please note that voicemails left after 4:00 p.m. may not be returned until the following business day.  We are closed weekends and major holidays. You have access to a nurse at all times for urgent questions. Please call the main number to the clinic Dept: [phone] and follow the prompts.   For any non-urgent questions, you may also contact your provider using MyChart. We now offer e-Visits for anyone 79 and older to request care online for non-urgent symptoms. For details visit [email or web address].   Also download the MyChart app! Go to the app store, search "MyChart", open the app, select [facility], and log in with your MyChart username and password.  Due to Covid, a mask is required upon entering the hospital/clinic. If you do not have a mask, one will be given to you upon arrival. For doctor visits, patients may have 1 support person aged 73 or older with them. For treatment visits, patients cannot have anyone with them due to current Covid guidelines and our immunocompromised population.

## 2021-06-15 NOTE — Progress Notes (Signed)
Nutrition Follow-up:  Patient receiving Keytruda for recurrent laryngeal cancer.   Met with patient in waiting area prior to infusion. Patient reports doing well, says she is cancer free. She is increasing oral intake of full liquids. Patient reports having a lot of mucous after eating orally. She recalls chicken broth, cereal with milk (fruit loops, golden grahams), pudding and chicken noodle soup. Patient reports noodles get stuck around gumline. She is going to trying thinning out pudding with milk, says it is too thick. Patient is drinking 2 [place] 1.5 by mouth, giving [place] 1.5 via tube. Patient is being seen by IR today due to broken hinge piece of  feeding tube cap. She denies nausea, vomiting, diarrhea, constipation.    Medications: reviewed  Labs: BUN 24  Anthropometrics: Weight 144 lb 3.2 oz today stable   9/2 - 143 lb 12.8 oz 8/12 - 142 lb 9.6 oz 7/22 - 143 lb   NUTRITION DIAGNOSIS: Food and nutrition knowledge related deficit improving  INTERVENTION:  Continue [employer] 1.5 - 5 cartons/day orally and via PEG Encouraged increasing oral intake of [employer] as tolerated Discussed strategies for thick saliva  Continue baking soda, salt water rinses several times daily Encouraged continuing to try other full liquids Patient may benefit from review of swallowing exercises, consider speech referral Patient has contact information    MONITORING, EVALUATION, GOAL: weight trends, intake   NEXT VISIT: Friday October 21 during infusion

## 2021-06-15 NOTE — Assessment & Plan Note (Signed)
She had lung nodules seen on CT I will get repeat CT scan of the chest next month for further follow-up

## 2021-06-15 NOTE — Progress Notes (Signed)
[place] OFFICE PROGRESS NOTE  Patient Care Team: Hamrick, Lorin Mercy, MD as PCP - General (Family Medicine) Heath Lark, MD as Consulting Physician (Hematology and Oncology)  ASSESSMENT & PLAN:  Laryngeal squamous cell carcinoma [facility]) Her imaging studies showed she has excellent response to immunotherapy and no signs of progression The plan will be to continue pembrolizumab indefinitely She is in agreement I plan to repeat CT imaging next month for further follow-up  Cancer associated pain She is able to reduce the amount of pain medicine required She will continue pain medicine taper as tolerated I have refilled her prescription pain medicine today  Tracheostomy care [facility]) She has slight increased bleeding likely due to side effects of anticoagulation therapy If her bleeding gets worse, I plan to reduce the dose of Eliquis  S/P gastrostomy ([place]) The cap of the feeding tube is coming loose I will get her IR evaluation today Due to her inability to swallow, she will likely be dependent on long-term feeding through gastrostomy tube  Metastasis to lung [facility]) She had lung nodules seen on CT I will get repeat CT scan of the chest next month for further follow-up  Orders Placed This Encounter  Procedures   CT CHEST W CONTRAST    Standing Status:   Future    Standing Expiration Date:   06/15/2022    Order Specific Question:   If indicated for the ordered procedure, I authorize the administration of contrast media per Radiology protocol    Answer:   Yes    Order Specific Question:   Preferred imaging location?    Answer:   [facility]    Order Specific Question:   Radiology Contrast Protocol - do NOT remove file path    Answer:   [email or web address]   IR Radiologist Eval & Mgmt    Standing Status:   Future    Standing Expiration Date:   06/15/2022    Order Specific Question:   Reason for Exam (SYMPTOM  OR DIAGNOSIS  REQUIRED)    Answer:   feeding tube malfunctoion    Order Specific Question:   Preferred Imaging Location?    Answer:   [facility]    All questions were answered. The patient knows to call the clinic with any problems, questions or concerns. The total time spent in the appointment was 30 minutes encounter with patients including review of chart and various tests results, discussions about plan of care and coordination of care plan   Heath Lark, MD 06/15/2021 10:39 AM  INTERVAL HISTORY: Please see below for problem oriented charting. she returns for treatment follow-up on Keytruda with her son We communicated with writing She stated the feeding tube is malfunctioning She also had 1 episode of bleeding around the tracheostomy site recently No other forms of bleeding Her pain is stable She is struggled to swallow and eat through her mouth  REVIEW OF SYSTEMS:   Constitutional: Denies fevers, chills or abnormal weight loss Eyes: Denies blurriness of vision Ears, nose, mouth, throat, and face: Denies mucositis or sore throat Respiratory: Denies cough, dyspnea or wheezes Cardiovascular: Denies palpitation, chest discomfort or lower extremity swelling Gastrointestinal:  Denies nausea, heartburn or change in bowel habits Skin: Denies abnormal skin rashes Lymphatics: Denies new lymphadenopathy or easy bruising Neurological:Denies numbness, tingling or new weaknesses Behavioral/Psych: Mood is stable, no new changes  All other systems were reviewed with the patient and are negative.  I have reviewed the past medical history, past surgical  history, social history and family history with the patient and they are unchanged from previous note.  ALLERGIES:  has No Known Allergies.  MEDICATIONS:  Current Outpatient Medications  Medication Sig Dispense Refill   apixaban (ELIQUIS) 5 MG TABS tablet Take 1 tablet (5 mg total) by mouth 2 (two) times daily. 60 tablet 3   chlorhexidine  (PERIDEX) 0.12 % solution Use as directed 15 mLs in the mouth or throat 2 (two) times daily. Rinse with 15 mls twice daily for 30 seconds. Use after breakfast and at bedtime. Spit out excess. Do not swallow. 473 mL 2   Cholecalciferol (VITAMIN D3) 125 MCG (5000 UT) CAPS Take 5,000 Units by mouth daily.     HYDROcodone-acetaminophen (NORCO/VICODIN) 5-325 MG tablet TAKE 1 TABLET BY MOUTH EVERY 6 HOURS AS NEEDED FOR MODERATE PAIN 60 tablet 0   levothyroxine (SYNTHROID) 137 MCG tablet Take 137 mcg by mouth daily before breakfast.     lidocaine-prilocaine (EMLA) cream Apply topically daily as needed. 30 g 9   Nutritional Supplements (KATE FARMS PEPTIDE 1.5) LIQD Take 4 Bottles by mouth daily. 325 mL 11   nystatin (MYCOSTATIN) 100000 UNIT/ML suspension Take 5 mLs by mouth 2 (two) times daily.     ondansetron (ZOFRAN) 8 MG tablet Take 1 tablet (8 mg total) by mouth 2 (two) times daily as needed (Nausea or vomiting). (Patient taking differently: Take 8 mg by mouth 2 (two) times daily as needed for nausea or vomiting (Nausea or vomiting).) 30 tablet 1   oxymetazoline (AFRIN) 0.05 % nasal spray Place 3 sprays into both nostrils 2 (two) times daily as needed (Bleeding from stoma). 30 mL 0   prochlorperazine (COMPAZINE) 10 MG tablet Take 1 tablet (10 mg total) by mouth every 6 (six) hours as needed (Nausea or vomiting). (Patient taking differently: Take 10 mg by mouth every 6 (six) hours as needed for nausea or vomiting (Nausea or vomiting).) 30 tablet 1   No current facility-administered medications for this visit.   Facility-Administered Medications Ordered in Other Visits  Medication Dose Route Frequency Provider Last Rate Last Admin   heparin lock flush 100 unit/mL  500 Units Intracatheter Once PRN Alvy Bimler, Quinlee Sciarra, MD       pembrolizumab (KEYTRUDA) 200 mg in sodium chloride 0.9 % 50 mL chemo infusion  200 mg Intravenous Once Casey Maxfield, MD       sodium chloride flush (NS) 0.9 % injection 10 mL  10 mL  Intracatheter PRN Heath Lark, MD        SUMMARY OF ONCOLOGIC HISTORY: Oncology History  Laryngeal squamous cell carcinoma (HCC)  12/02/2018 Imaging   CT neck: IMPRESSION: 1. Supraglottic laryngeal carcinoma extending across the anterior commissure and invading the prelaryngeal fat, left aryepiglottic fold and left aspect of the epiglottis. 2. Severe narrowing of the laryngeal airway. 3. Bilateral subcentimeter level 2A and 2A cervical lymph nodes.     12/02/2018 Imaging   CT  Chest:  IMPRESSION: No evidence of metastatic disease in the chest.   12/03/2018 Pathology Results   (Care Everywhere; [facility]) A: Larynx, supraglottis, biopsy - Atypical squamous proliferation with at least low grade dysplasia and abundant keratinization - No definite high grade dysplasia or invasive carcinoma represented (see comment)   12/18/2018 Pathology Results   (Care Everywhere; [facility]) A:  Neck, left, fine needle aspiration - Rare single atypical cells, no cells diagnostic of malignancy - Lymph node material present  B:  Neck, right, fine needle aspiration - Non-diagnostic specimen - No  lymph node material identified - Mixed inflammation and blood   12/31/2018 Pathology Results   (Care Everywhere; [facility])  A: Larynx, left supraglottic mass, biopsy - Atypical squamous proliferation with abundant keratinization, suspicious for well-differentiated invasive squamous cell carcinoma  B: Larynx, left supraglottic mass, biopsy - Well-differentiated invasive squamous cell carcinoma with abundant keratinization (see comment)   01/18/2019 Initial Diagnosis   Laryngeal cancer ([place])   01/26/2019 Imaging   1. Locally advanced hypermetabolic laryngeal neoplasm centered in the left supraglottic space with bilateral glottic and epiglottic involvement and subglottic extension on the left. 2. Hypermetabolic bilateral level 3 neck nodal metastases. 3. Hypermetabolic high mediastinal nodal  metastases between the trachea and upper thoracic esophagus. 4. Otherwise no distant hypermetabolic metastatic disease. 5. Nodular 7 mm focus at the left lung base, below PET resolution, recommend attention on follow-up chest CT in 3 months. 6. Chronic findings include: Aortic Atherosclerosis (ICD10-I70.0). Cholelithiasis.   02/08/2019 Cancer Staging   Staging form: Larynx - Supraglottis, AJCC 8th Edition - Clinical stage from 02/08/2019: Stage IVA (cT3, cN2c, cM0) - Signed by Eppie Gibson, MD on 02/08/2019   02/11/2019 - 04/07/2019 Chemotherapy   The patient weekly cisplatin for chemotherapy treatment.     07/20/2019 Pathology Results   PROCEDURE: Total glossectomy, total pharyngectomy, total laryngectomy, total thyroidectomy, and cervical esophagectomy TUMOR SITE: Larynx TUMOR LATERALITY: Not specified TUMOR FOCALITY: Unifocal TUMOR SIZE:    GREATEST DIMENSION: 6.9 cm    ADDITIONAL DIMENSIONS: 5.4 x 2.4 cm HISTOLOGIC TYPE: Squamous cell carcinoma, conventional (keratinizing) HISTOLOGIC GRADE:  G1-G2: Well to moderately differentiated TUMOR EXTENSION: Tumor involves the posterior tongue, pharynx, larynx, tracheal cartilage, hyoid bone, cervical esophagus, vagus nerve, carotid artery, deep cervical fascia, ans surrounding skeletal muscle and soft tissue MARGINS: Uninvolved by invasive tumor; Perineural invasion is less than 71mm from the left soft tissue margin LYMPHOVASCULAR INVASION: Not identified PERINEURAL INVASION:  Present REGIONAL LYMPH NODES:     NUMBER OF LYMPH NODES INVOLVED: 2     NUMBER OF LYMPH NODES EXAMINED: 24     LATERALITY OF LYMPH NODES INVOLVED: Cannot determine     SIZE OF LARGEST METASTATIC DEPOSIT: 0.8 cm     EXTRANODAL EXTENSION: Not identified PATHOLOGIC STAGE CLASSIFICATION (pTNM, AJCC 8TH Ed): (r)(y) pT4b pN2a DISTANT METASTASIS (pM): ADDITIONAL PATHOLOGIC FINDINGS: Previous chemoradiation   11/04/2019 Imaging   PET: 1.  Hypermetabolic bilateral  neck lesions as detailed above are concerning for disease recurrence. 2.  Compared to 07/08/2019 PET scan, interval development of subcentimeter bilateral upper lobe pulmonary nodules, size below PET resolution. These are concerning for metastasis. Consider diagnostic quality CT chest exam for further evaluation as per oncology treatment planning. 3.  Focal hypermetabolic uptake in right ventricular cavity of indeterminate significance. Recommend echocardiographic examination to rule out underlying thrombus. This finding was discussed with Dr. Conley Canal by Dr. Clementeen Graham and Dr. Marcello Moores on 11/04/2019 at approximately 3:00 PM via telephone. 4.  Ancillary CT findings as above.   11/05/2019 Imaging   Echocardiogram: Summary   1. The left ventricle is normal in size with normal wall thickness.   2. The left ventricular systolic function is normal, LVEF is visually estimated at 60-65%.   3. The right ventricle is normal in size, with normal systolic function.   4. There is an extrinsic compression of the RV and RA laterally at the AV groove by a mass of 4.3 cm diameter. Unclear if the mass is invasive, although no pericardial effusion seen suggestive no invasion possibly. There is no evidence  of hemodynamic compromise secondary to cardiac compression with normal IVC diameter.   12/06/2019 Pathology Results   A. LYMPH NODE, LEFT SUBMANDIBULAR, BIOPSY:  - Squamous cell carcinoma.  - No distinct nodal tissue identified.    12/06/2019 Procedure   Successful ultrasound-guided core biopsy of centrally necrotic left submandibular lymph node. Of note, due to extensive prior postsurgical and post radiation changes, patient has very limited mobility in the neck and the biopsy was extremely challenging    12/16/2019 -  Chemotherapy   The patient had pembrolizumab for chemotherapy treatment.     03/08/2020 PET scan   1. Diminished size of masses in the neck as described. Persistent increased metabolic activity  particularly in the LEFT neck. Some of this diminished size on the LEFT could be due to decompression of necrotic material. Correlate with any symptoms of inflammation or infection in this area as well given the paucity of residual soft tissue associated with the above abnormality, overall findings are compatible with residual disease in this location and there is no significant surrounding stranding to indicate inflammation that would correlate with the degree of FDG uptake remaining. 2. Diminished activity in the juxta cardiac mass with peripheral activity suggesting interval necrosis, on the prior study this was uniformly hypermetabolic. There is still considerable FDG uptake. 3. No signs of disease in the abdomen or pelvis. 4. 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 Correlate with any new pain or symptoms in this area with further imaging as warranted. No discrete mass or bone destruction on today's study.   06/08/2020 Imaging   CT neck 1. Status post laryngectomy and neck dissection without lymphadenopathy or mass lesion. 2. Short segment severe stenosis of the left internal carotid artery adjacent to a surgical clip.     06/08/2020 Imaging   CT chest 1. There is a large, hypodense lesion within, or very closely abutting and likely intruding upon the right ventricle, as seen on prior examination and approximately 4.6 x 4.3 cm, poorly evaluated due to cardiac motion. Findings are consistent with metastatic lesion and/or thrombus and poorly assessed by ungated CT. Consider echocardiography and cardiac MRI for further assessment.   2. Multiple small pulmonary nodules in the bilateral lung apices are unchanged. Attention on follow-up.   3.  No evidence of new metastatic disease in the chest.   4.  Aortic Atherosclerosis (ICD10-I70.0).     09/07/2020 Imaging   1. Mild decrease in size of juxta cardiac metastatic involving the right ventricle. 2. Small pulmonary nodules are unchanged from  previous exam. 3. Aortic atherosclerosis. 4. Gallstone.     12/14/2020 Procedure   Successful exchange and up size from 20 French pull-through gastrostomy to 22 French balloon retention gastrostomy.   03/30/2021 Imaging   Ct neck 1.  In comparison with PET/CT from February 2021, marked interval decrease in size of soft tissue mass along the angle of the left mandible consistent with a positive treatment response. However, there are new destructive bony changes along the lingual surface of the mandible in this region with appearance of soft tissue infiltrating the marrow cavity. While concerning for neoplastic involvement, changes could also represent other nonspecific osteonecrosis including infection or sequela of prior radiation treatment. Recommend correlation with direct visual inspection. Further evaluation with MRI may be of value.  2.  Centrally necrotic 1.3 cm right level IIa lymph node corresponding with previously described hypermetabolic lesion, decreased in size from prior. No suspicious new or enlarged lymph nodes in the neck.  3.  Enlarged right lower paratracheal lymph node suspected in the mediastinum, incompletely imaged. Consider dedicated CT imaging of the chest.  4.  Scattered pulmonary nodules in the imaged lung apices, the largest a 9 mm lesion in the right upper lobe. Recommend comparison with outside imaging ofthe chest if available.   05/03/2021 Echocardiogram    1. Left ventricular ejection fraction, by estimation, is 55 to 60%. The left ventricle has normal function. The left ventricle has no regional wall motion abnormalities. There is mild left ventricular hypertrophy. Left ventricular diastolic parameters are consistent with Grade I diastolic dysfunction (impaired relaxation).  2. Prominent echogenic structure on the RV free wall, possibly attached to the TV - measuring about 2.0 cm in diameter and 3.0 cm in length - some mobile filamentous material is attached, suggestive  of thrombus or less likely tumor.. Right ventricular systolic function is normal. The right ventricular size is normal.  3. The mitral valve is grossly normal. Trivial mitral valve regurgitation.  4. The aortic valve is tricuspid. Aortic valve regurgitation is not visualized.     05/04/2021 Imaging   RIGHT: - There is no evidence of deep vein thrombosis in the lower extremity.   - No cystic structure found in the popliteal fossa.   LEFT: - There is no evidence of deep vein thrombosis in the lower extremity.   - No cystic structure found in the popliteal fossa.       PHYSICAL EXAMINATION: ECOG PERFORMANCE STATUS: 1 - Symptomatic but completely ambulatory  Vitals:   06/15/21 0941  BP: 109/63  Pulse: 85  Resp: 18  Temp: (!) 97.4 F (36.3 C)  SpO2: 100%   Filed Weights   06/15/21 0941  Weight: 144 lb 3.2 oz (65.4 kg)    GENERAL:alert, no distress and comfortable NECK: Tracheostomy in situ ABDOMEN: The feeding tube appears degraded Musculoskeletal:no cyanosis of digits and no clubbing  NEURO: alert & oriented x 3   LABORATORY DATA:  I have reviewed the data as listed    Component Value Date/Time   NA 140 06/15/2021 0910   K 4.1 06/15/2021 0910   CL 104 06/15/2021 0910   CO2 26 06/15/2021 0910   GLUCOSE 98 06/15/2021 0910   BUN 24 (H) 06/15/2021 0910   CREATININE 0.77 06/15/2021 0910   CREATININE 0.71 04/13/2021 0827   CALCIUM 9.2 06/15/2021 0910   PROT 7.2 06/15/2021 0910   ALBUMIN 4.0 06/15/2021 0910   AST 18 06/15/2021 0910   AST 22 04/13/2021 0827   ALT 13 06/15/2021 0910   ALT 17 04/13/2021 0827   ALKPHOS 69 06/15/2021 0910   BILITOT 0.4 06/15/2021 0910   BILITOT 0.5 04/13/2021 0827   GFRNONAA >60 06/15/2021 0910   GFRNONAA >60 04/13/2021 0827   GFRAA >60 06/08/2020 1303   GFRAA >60 02/17/2020 1128    No results found for: SPEP, UPEP  Lab Results  Component Value Date   WBC 3.8 (L) 06/15/2021   NEUTROABS 2.1 06/15/2021   HGB 13.1 06/15/2021    HCT 39.1 06/15/2021   MCV 86.3 06/15/2021   PLT 216 06/15/2021      Chemistry      Component Value Date/Time   NA 140 06/15/2021 0910   K 4.1 06/15/2021 0910   CL 104 06/15/2021 0910   CO2 26 06/15/2021 0910   BUN 24 (H) 06/15/2021 0910   CREATININE 0.77 06/15/2021 0910   CREATININE 0.71 04/13/2021 0827      Component Value Date/Time   CALCIUM 9.2 06/15/2021  0910   ALKPHOS 69 06/15/2021 0910   AST 18 06/15/2021 0910   AST 22 04/13/2021 0827   ALT 13 06/15/2021 0910   ALT 17 04/13/2021 0827   BILITOT 0.4 06/15/2021 0910   BILITOT 0.5 04/13/2021 0827

## 2021-06-15 NOTE — Assessment & Plan Note (Signed)
She is able to reduce the amount of pain medicine required She will continue pain medicine taper as tolerated I have refilled her prescription pain medicine today

## 2021-06-15 NOTE — Assessment & Plan Note (Signed)
The cap of the feeding tube is coming loose I will get her IR evaluation today Due to her inability to swallow, she will likely be dependent on long-term feeding through gastrostomy tube

## 2021-06-19 ENCOUNTER — Other Ambulatory Visit (HOSPITAL_COMMUNITY): Payer: Self-pay

## 2021-06-20 DIAGNOSIS — R633 Feeding difficulties, unspecified: Secondary | ICD-10-CM | POA: Diagnosis not present

## 2021-06-20 DIAGNOSIS — C321 Malignant neoplasm of supraglottis: Secondary | ICD-10-CM | POA: Diagnosis not present

## 2021-07-11 ENCOUNTER — Other Ambulatory Visit: Payer: Self-pay

## 2021-07-11 ENCOUNTER — Inpatient Hospital Stay: Payer: Medicare HMO | Attending: Hematology

## 2021-07-11 ENCOUNTER — Ambulatory Visit (HOSPITAL_COMMUNITY)
Admission: RE | Admit: 2021-07-11 | Discharge: 2021-07-11 | Disposition: A | Discharge status: Home / Self Care | Payer: Medicare HMO | Source: Ambulatory Visit | Attending: Hematology and Oncology | Admitting: Hematology and Oncology

## 2021-07-11 DIAGNOSIS — Z23 Encounter for immunization: Secondary | ICD-10-CM | POA: Insufficient documentation

## 2021-07-11 DIAGNOSIS — I513 Intracardiac thrombosis, not elsewhere classified: Secondary | ICD-10-CM | POA: Diagnosis not present

## 2021-07-11 DIAGNOSIS — E039 Hypothyroidism, unspecified: Secondary | ICD-10-CM | POA: Insufficient documentation

## 2021-07-11 DIAGNOSIS — Z923 Personal history of irradiation: Secondary | ICD-10-CM | POA: Insufficient documentation

## 2021-07-11 DIAGNOSIS — Z5112 Encounter for antineoplastic immunotherapy: Secondary | ICD-10-CM | POA: Insufficient documentation

## 2021-07-11 DIAGNOSIS — R911 Solitary pulmonary nodule: Secondary | ICD-10-CM | POA: Diagnosis not present

## 2021-07-11 DIAGNOSIS — C7800 Secondary malignant neoplasm of unspecified lung: Secondary | ICD-10-CM

## 2021-07-11 DIAGNOSIS — C329 Malignant neoplasm of larynx, unspecified: Secondary | ICD-10-CM | POA: Diagnosis not present

## 2021-07-11 DIAGNOSIS — Z79899 Other long term (current) drug therapy: Secondary | ICD-10-CM | POA: Insufficient documentation

## 2021-07-11 DIAGNOSIS — C321 Malignant neoplasm of supraglottis: Secondary | ICD-10-CM | POA: Insufficient documentation

## 2021-07-11 DIAGNOSIS — Z7901 Long term (current) use of anticoagulants: Secondary | ICD-10-CM | POA: Insufficient documentation

## 2021-07-11 DIAGNOSIS — Z930 Tracheostomy status: Secondary | ICD-10-CM | POA: Insufficient documentation

## 2021-07-11 DIAGNOSIS — Z87891 Personal history of nicotine dependence: Secondary | ICD-10-CM | POA: Insufficient documentation

## 2021-07-11 DIAGNOSIS — G893 Neoplasm related pain (acute) (chronic): Secondary | ICD-10-CM | POA: Insufficient documentation

## 2021-07-11 DIAGNOSIS — I700 Atherosclerosis of aorta: Secondary | ICD-10-CM | POA: Diagnosis not present

## 2021-07-11 LAB — CBC WITH DIFFERENTIAL/PLATELET
Abs Immature Granulocytes: 0 10*3/uL (ref 0.00–0.07)
Basophils Absolute: 0 10*3/uL (ref 0.0–0.1)
Basophils Relative: 1 %
Eosinophils Absolute: 0.2 10*3/uL (ref 0.0–0.5)
Eosinophils Relative: 5 %
HCT: 40.1 % (ref 36.0–46.0)
Hemoglobin: 13.3 g/dL (ref 12.0–15.0)
Immature Granulocytes: 0 %
Lymphocytes Relative: 26 %
Lymphs Abs: 1.1 10*3/uL (ref 0.7–4.0)
MCH: 28.4 pg (ref 26.0–34.0)
MCHC: 33.2 g/dL (ref 30.0–36.0)
MCV: 85.7 fL (ref 80.0–100.0)
Monocytes Absolute: 0.5 10*3/uL (ref 0.1–1.0)
Monocytes Relative: 12 %
Neutro Abs: 2.5 10*3/uL (ref 1.7–7.7)
Neutrophils Relative %: 56 %
Platelets: 225 10*3/uL (ref 150–400)
RBC: 4.68 MIL/uL (ref 3.87–5.11)
RDW: 13.2 % (ref 11.5–15.5)
WBC: 4.4 10*3/uL (ref 4.0–10.5)
nRBC: 0 % (ref 0.0–0.2)

## 2021-07-11 LAB — COMPREHENSIVE METABOLIC PANEL
ALT: 13 U/L (ref 0–44)
AST: 20 U/L (ref 15–41)
Albumin: 4.1 g/dL (ref 3.5–5.0)
Alkaline Phosphatase: 63 U/L (ref 38–126)
Anion gap: 12 (ref 5–15)
BUN: 22 mg/dL (ref 8–23)
CO2: 25 mmol/L (ref 22–32)
Calcium: 9.4 mg/dL (ref 8.9–10.3)
Chloride: 104 mmol/L (ref 98–111)
Creatinine, Ser: 0.75 mg/dL (ref 0.44–1.00)
GFR, Estimated: >60 mL/min (ref >60)
Glucose, Bld: 103 mg/dL — ABNORMAL HIGH (ref 70–99)
Potassium: 4 mmol/L (ref 3.5–5.1)
Sodium: 141 mmol/L (ref 135–145)
Total Bilirubin: 0.3 mg/dL (ref 0.3–1.2)
Total Protein: 7.5 g/dL (ref 6.5–8.1)

## 2021-07-11 LAB — TSH: TSH: 2.744 u[IU]/mL (ref 0.308–3.960)

## 2021-07-11 IMAGING — CT CT CHEST W/ CM
2 of 4 series · 14 of 36 positions shown, 17 images · IV contrast (omnipaque)
Comparison: [DATE]

CLINICAL DATA: Laryngeal cancer, assess treatment response, assess
intracardiac thrombus

EXAM:
CT CHEST WITH CONTRAST
TECHNIQUE: Multidetector CT imaging of the chest was performed during
intravenous contrast administration.
CONTRAST:  60mL OMNIPAQUE IOHEXOL 350 MG/ML SOLN

[Series 2: axial st · axial · 0.84mm/px · z∈[-162,+80]mm · 11 of 143 slices shown, 14 images]
[im 11/143  mediastinal]
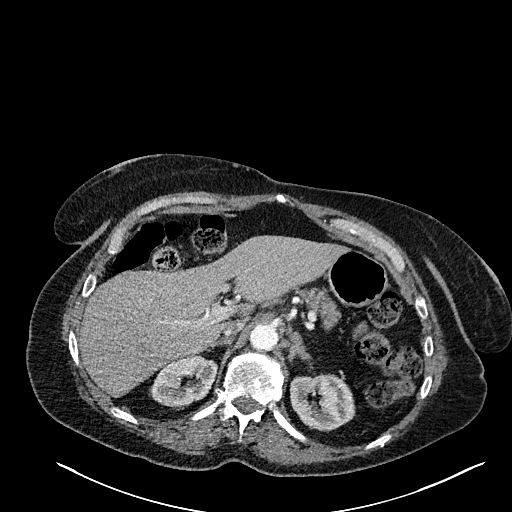
[im 11/143  lung]
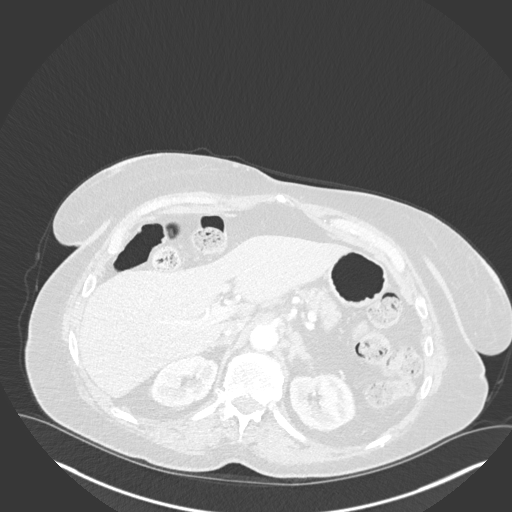
[im 21/143  lung]
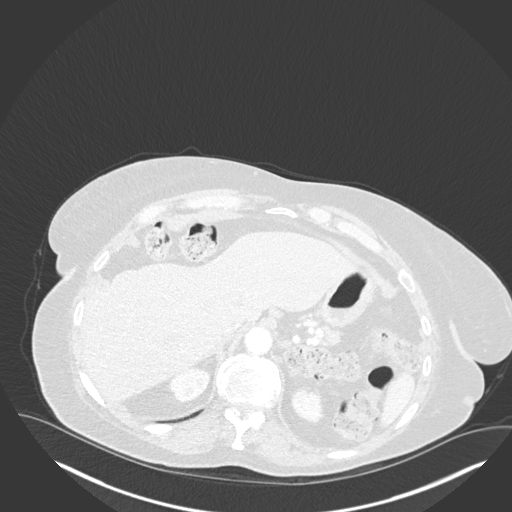
[im 31/143  lung]
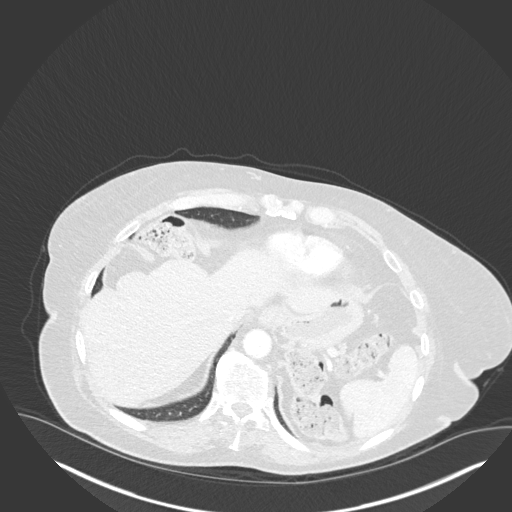
[im 51/143  lung]
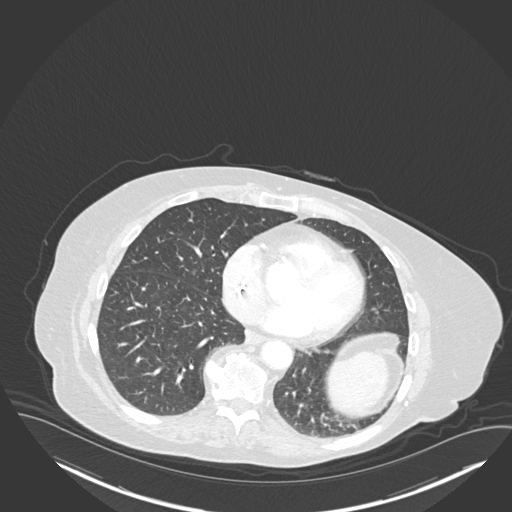
[im 61/143  mediastinal]
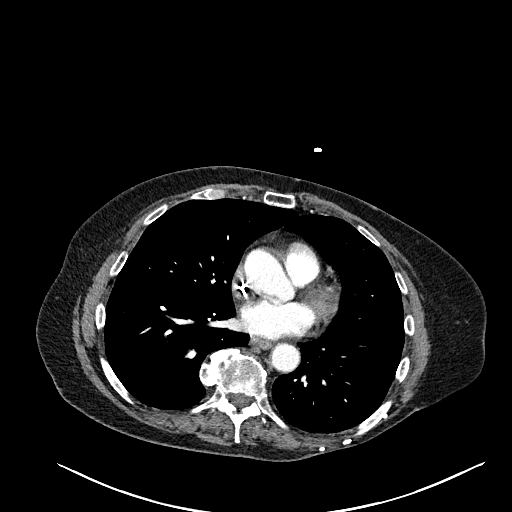
[im 61/143  lung]
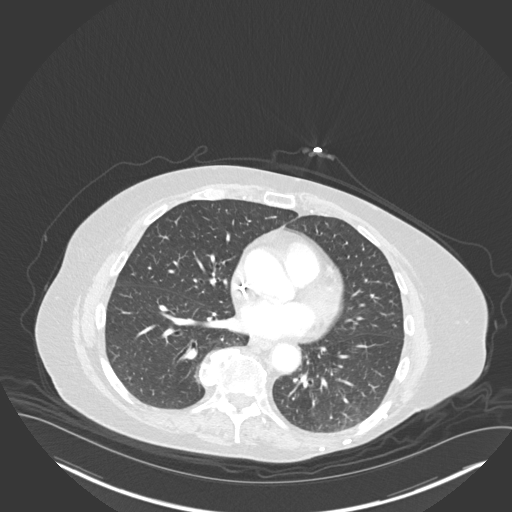
[im 72/143  lung]
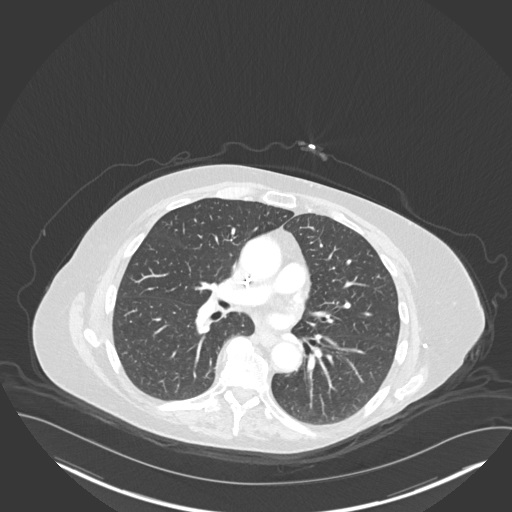
[im 82/143  lung]
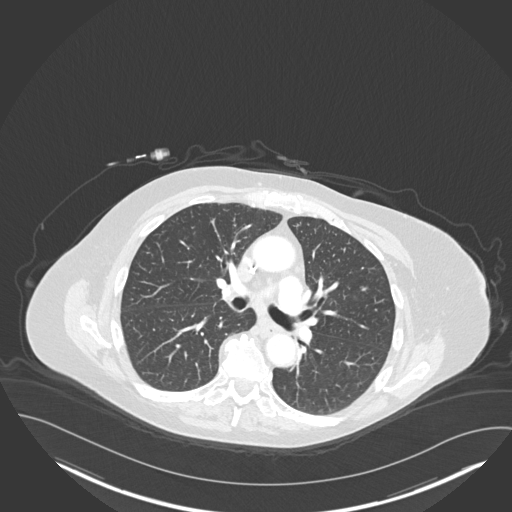
[im 92/143  lung]
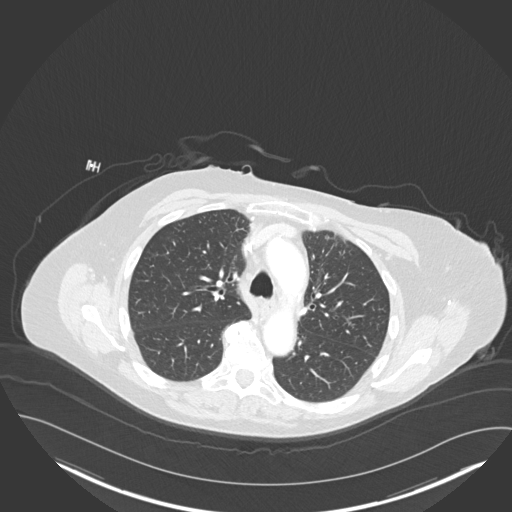
[im 112/143  mediastinal]
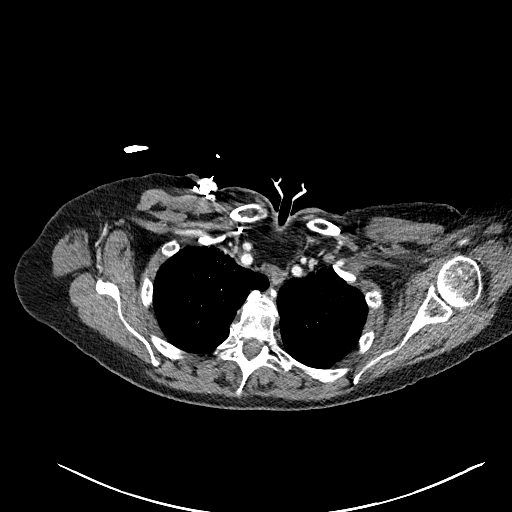
[im 112/143  lung]
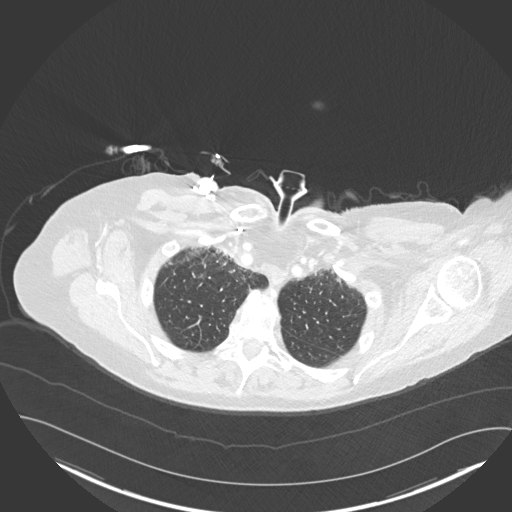
[im 122/143  lung]
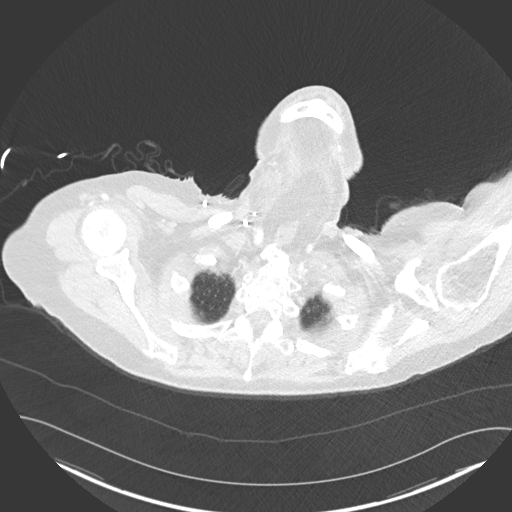
[im 132/143  lung]
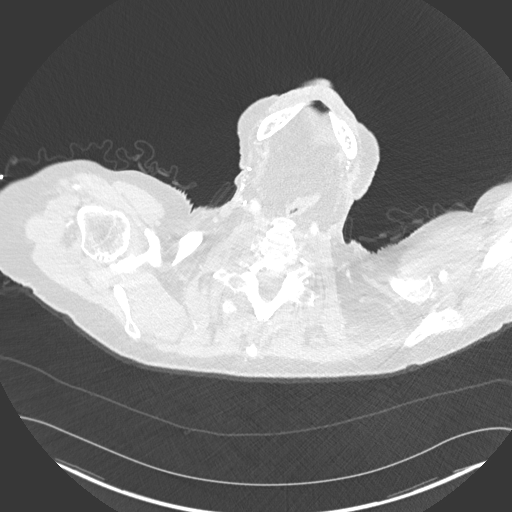

[Series 6: coronal · coronal · 0.63mm/px · 3 of 122 slices shown]
[im 25/122  lung]
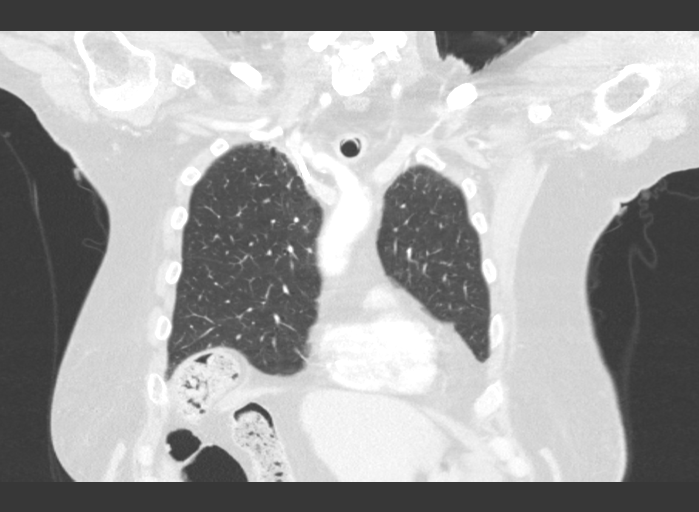
[im 49/122  lung]
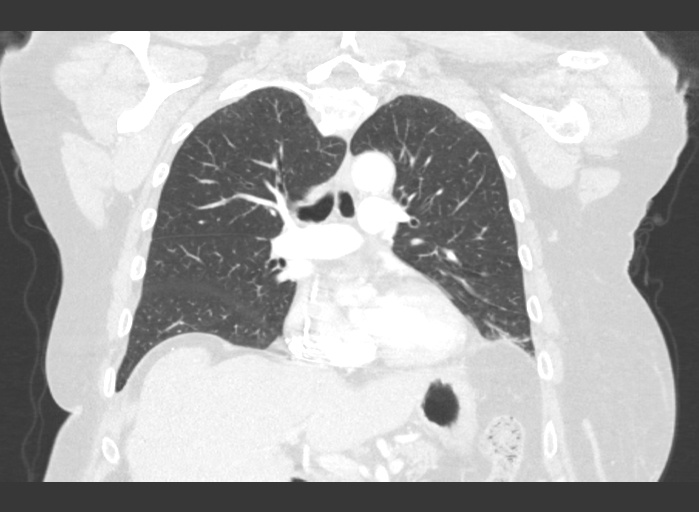
[im 73/122  lung]
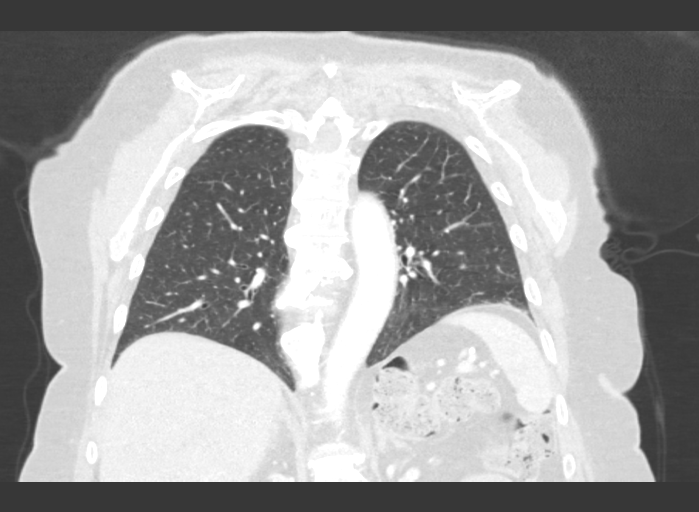

[14 of 36 positions shown; findings below may reference images not displayed]

FINDINGS: Cardiovascular: Right chest port catheter. Aortic atherosclerosis. A
large, heterogeneous mass about and within the right ventricle is
difficult to clearly assess due to cardiac motion and mixing
artifact on this non tailored examination, but suspect it has
significantly increased in size, and now involves the right atrium
in addition to the right ventricle and pulmonary outflow tract,
measuring approximately 6.9 x 4.9 cm, previously 4.5 x 4.2 cm when
measured similarly (series 2, image 102). Left and right coronary
artery calcifications. No pericardial effusion.

Mediastinum/Nodes: Unchanged prominent pretracheal lymph nodes,
measuring up to 1.3 x 1.3 cm without overtly enlarged mediastinal or
hilar lymphadenopathy. Status post laryngectomy and tracheostomy.
Thyroid gland and esophagus demonstrate no significant findings.

Lungs/Pleura: Minimal biapical radiation fibrosis. No significant
change of a right upper lobe pulmonary nodule, measuring 0.8 cm
(series 5, image 34). No pleural effusion or pneumothorax.

Upper Abdomen: No acute abnormality.

Musculoskeletal: No chest wall mass or suspicious bone lesions
identified.
IMPRESSION: 1. A large, heterogeneous mass about and within the right ventricle
is difficult to clearly assess due to cardiac motion and mixing
artifact on this non tailored examination, but suspect it has
significantly increased in size, and now involves the right atrium
in addition to the right ventricle and pulmonary outflow tract,
measuring approximately 6.9 x 4.9 cm, previously 4.5 x 4.2 cm when
measured similarly. This may reflect a combination of metastatic
soft tissue and thrombus, and could be more clearly assessed by
echocardiography and cardiac MRI.
2. No significant change of a right upper lobe pulmonary nodule,
measuring 0.8 cm, which remains modestly suspicious for a
metastasis. Other small nodules are likewise stable. Attention on
follow-up.
3. Status post laryngectomy and tracheostomy.
4. Coronary artery disease.

Aortic Atherosclerosis ([9L]-[9L]).

## 2021-07-11 MED ORDER — HEPARIN SOD (PORK) LOCK FLUSH 100 UNIT/ML IV SOLN: 500.0000 [IU] | Freq: Once | INTRAVENOUS | Status: AC

## 2021-07-11 MED ORDER — IOHEXOL 350 MG/ML SOLN
60.0000 mL | Freq: Once | INTRAVENOUS | Status: AC | PRN
  Administered 2021-07-11: 60 mL via INTRAVENOUS

## 2021-07-11 MED ORDER — HEPARIN SOD (PORK) LOCK FLUSH 100 UNIT/ML IV SOLN
INTRAVENOUS | Status: AC
  Administered 2021-07-11: 500 [IU] via INTRAVENOUS
  Filled 2021-07-11: qty 5

## 2021-07-13 ENCOUNTER — Inpatient Hospital Stay (HOSPITAL_BASED_OUTPATIENT_CLINIC_OR_DEPARTMENT_OTHER): Payer: Medicare HMO | Admitting: Hematology and Oncology

## 2021-07-13 ENCOUNTER — Other Ambulatory Visit: Payer: Self-pay

## 2021-07-13 ENCOUNTER — Inpatient Hospital Stay: Payer: Medicare HMO

## 2021-07-13 ENCOUNTER — Telehealth: Payer: Self-pay

## 2021-07-13 ENCOUNTER — Other Ambulatory Visit (HOSPITAL_COMMUNITY): Payer: Self-pay

## 2021-07-13 ENCOUNTER — Encounter: Payer: Self-pay | Admitting: Hematology and Oncology

## 2021-07-13 ENCOUNTER — Inpatient Hospital Stay: Payer: Medicare HMO | Admitting: Dietician

## 2021-07-13 VITALS — BP 104/71 | HR 85 | Temp 98.1°F | Resp 18 | Ht 62.0 in | Wt 144.4 lb

## 2021-07-13 DIAGNOSIS — Z79899 Other long term (current) drug therapy: Secondary | ICD-10-CM | POA: Insufficient documentation

## 2021-07-13 DIAGNOSIS — I5189 Other ill-defined heart diseases: Secondary | ICD-10-CM | POA: Diagnosis not present

## 2021-07-13 DIAGNOSIS — C329 Malignant neoplasm of larynx, unspecified: Secondary | ICD-10-CM | POA: Diagnosis not present

## 2021-07-13 DIAGNOSIS — Z923 Personal history of irradiation: Secondary | ICD-10-CM | POA: Diagnosis not present

## 2021-07-13 DIAGNOSIS — C321 Malignant neoplasm of supraglottis: Secondary | ICD-10-CM

## 2021-07-13 DIAGNOSIS — Z87891 Personal history of nicotine dependence: Secondary | ICD-10-CM | POA: Diagnosis not present

## 2021-07-13 DIAGNOSIS — Z5112 Encounter for antineoplastic immunotherapy: Secondary | ICD-10-CM | POA: Diagnosis not present

## 2021-07-13 DIAGNOSIS — E039 Hypothyroidism, unspecified: Secondary | ICD-10-CM | POA: Diagnosis not present

## 2021-07-13 DIAGNOSIS — Z23 Encounter for immunization: Secondary | ICD-10-CM

## 2021-07-13 DIAGNOSIS — I513 Intracardiac thrombosis, not elsewhere classified: Secondary | ICD-10-CM | POA: Diagnosis not present

## 2021-07-13 DIAGNOSIS — Z7901 Long term (current) use of anticoagulants: Secondary | ICD-10-CM | POA: Insufficient documentation

## 2021-07-13 DIAGNOSIS — Z930 Tracheostomy status: Secondary | ICD-10-CM

## 2021-07-13 DIAGNOSIS — G893 Neoplasm related pain (acute) (chronic): Secondary | ICD-10-CM | POA: Diagnosis not present

## 2021-07-13 MED ORDER — INFLUENZA VAC A&B SA ADJ QUAD 0.5 ML IM PRSY
0.5000 mL | PREFILLED_SYRINGE | Freq: Once | INTRAMUSCULAR | Status: AC
  Administered 2021-07-13: 0.5 mL via INTRAMUSCULAR
  Filled 2021-07-13: qty 0.5

## 2021-07-13 MED ORDER — HYDROCODONE-ACETAMINOPHEN 5-325 MG PO TABS
1.0000 | ORAL_TABLET | Freq: Four times a day (QID) | ORAL | 0 refills | Status: DC | PRN
  Filled 2021-07-13: qty 60, 15d supply, fill #0

## 2021-07-13 MED ORDER — APIXABAN 2.5 MG PO TABS: 2.5000 mg | ORAL_TABLET | Freq: Two times a day (BID) | ORAL | 3 refills | Status: DC

## 2021-07-13 MED ORDER — HEPARIN SOD (PORK) LOCK FLUSH 100 UNIT/ML IV SOLN
500.0000 [IU] | Freq: Once | INTRAVENOUS | Status: AC | PRN
  Administered 2021-07-13: 500 [IU]

## 2021-07-13 MED ORDER — SODIUM CHLORIDE 0.9% FLUSH
10.0000 mL | INTRAVENOUS | Status: DC | PRN
  Administered 2021-07-13: 10 mL

## 2021-07-13 MED ORDER — SODIUM CHLORIDE 0.9 % IV SOLN: Freq: Once | INTRAVENOUS | Status: AC

## 2021-07-13 MED ORDER — SODIUM CHLORIDE 0.9 % IV SOLN
200.0000 mg | Freq: Once | INTRAVENOUS | Status: AC
  Administered 2021-07-13: 200 mg via INTRAVENOUS
  Filled 2021-07-13: qty 8

## 2021-07-13 NOTE — Assessment & Plan Note (Signed)
She is able to reduce the amount of pain medicine required She will continue pain medicine taper as tolerated I have refilled her prescription pain medicine today

## 2021-07-13 NOTE — Assessment & Plan Note (Signed)
This was extensively evaluated several months ago She is on anticoagulation therapy I recommend echocardiogram for evaluation and she is in agreement

## 2021-07-13 NOTE — Progress Notes (Signed)
Nutrition Follow-up:  Patient receiving Keytruda for recurrent laryngeal cancer.   Met with patient in infusion. Patient reports doing well. She denies nausea, vomiting, diarrhea, constipation. Patient reports ongoing thick saliva, she is doing baking soda, salt water rinses several times/day. She is tolerating some soft, smooth foods orally. Patient recalls cream of wheat, creamy soups, and cereal. She has 2 cups of coffee with added vanilla Boost for creamer. Patient is drinking 2 [employer] [place] and 4 (8 oz) bottles of water by mouth. Patient gives 3 cartons [employer] 1.5 via tube with 1 (8 oz) bottle of water at each feeding.  Medications: reviewed  Labs: 10/19 - Glucose 103  Anthropometrics: Weight 144 lb 6.4 oz today stable. Patient weighed 144 lb 3.2 oz on 9/23 and 143 lb 12.8 oz on 9/2  8/12 - 142 lb 9.6 oz 7/22 - 143 lb    NUTRITION DIAGNOSIS: Food and nutrition knowledge related deficit improved   INTERVENTION:  Encouraged pt to continue strategies for increasing intake of soft, smooth foods orally  Continue [employer] 1.5 - 5 cartons/day orally and via PEG Continue using baking soda, salt water rinses several times daily Patient has contact information     MONITORING, EVALUATION, GOAL: weight trends, intake, tube feedings   NEXT VISIT: To be scheduled

## 2021-07-13 NOTE — Patient Instructions (Signed)
[facility]  Discharge Instructions: ?Thank you for choosing [facility] to provide your oncology and hematology care.  ? ?If you have a lab appointment with the Cancer Center, please go directly to the Cancer Center and check in at the registration area. ?  ?Wear comfortable clothing and clothing appropriate for easy access to any Portacath or PICC line.  ? ?We strive to give you quality time with your provider. You may need to reschedule your appointment if you arrive late (15 or more minutes).  Arriving late affects you and other patients whose appointments are after yours.  Also, if you miss three or more appointments without notifying the office, you may be dismissed from the clinic at the provider?s discretion.    ?  ?For prescription refill requests, have your pharmacy contact our office and allow 72 hours for refills to be completed.   ? ?Today you received the following chemotherapy and/or immunotherapy agents: Keytruda ?  ?To help prevent nausea and vomiting after your treatment, we encourage you to take your nausea medication as directed. ? ?BELOW ARE SYMPTOMS THAT SHOULD BE REPORTED IMMEDIATELY: ?*FEVER GREATER THAN 100.4 F (38 ?C) OR HIGHER ?*CHILLS OR SWEATING ?*NAUSEA AND VOMITING THAT IS NOT CONTROLLED WITH YOUR NAUSEA MEDICATION ?*UNUSUAL SHORTNESS OF BREATH ?*UNUSUAL BRUISING OR BLEEDING ?*URINARY PROBLEMS (pain or burning when urinating, or frequent urination) ?*BOWEL PROBLEMS (unusual diarrhea, constipation, pain near the anus) ?TENDERNESS IN MOUTH AND THROAT WITH OR WITHOUT PRESENCE OF ULCERS (sore throat, sores in mouth, or a toothache) ?UNUSUAL RASH, SWELLING OR PAIN  ?UNUSUAL VAGINAL DISCHARGE OR ITCHING  ? ?Items with * indicate a potential emergency and should be followed up as soon as possible or go to the Emergency Department if any problems should occur. ? ?Please show the CHEMOTHERAPY ALERT CARD or IMMUNOTHERAPY ALERT CARD at check-in to the  Emergency Department and triage nurse. ? ?Should you have questions after your visit or need to cancel or reschedule your appointment, please contact [facility]  Dept: [phone]  and follow the prompts.  Office hours are 8:00 a.m. to 4:30 p.m. Monday - Friday. Please note that voicemails left after 4:00 p.m. may not be returned until the following business day.  We are closed weekends and major holidays. You have access to a nurse at all times for urgent questions. Please call the main number to the clinic Dept: [phone] and follow the prompts. ? ? ?For any non-urgent questions, you may also contact your provider using MyChart. We now offer e-Visits for anyone 18 and older to request care online for non-urgent symptoms. For details visit [email or web address]. ?  ?Also download the MyChart app! Go to the app store, search "MyChart", open the app, select , and log in with your MyChart username and password. ? ?Due to Covid, a mask is required upon entering the hospital/clinic. If you do not have a mask, one will be given to you upon arrival. For doctor visits, patients may have 1 support person aged 18 or older with them. For treatment visits, patients cannot have anyone with them due to current Covid guidelines and our immunocompromised population.  ? ?

## 2021-07-13 NOTE — Assessment & Plan Note (Signed)
I have reviewed multiple imaging studies with the patient I am not entirely convinced about the reading from radiologist I recommend we proceed with treatment and I will order echocardiogram to look at the mass in a slightly different way She is in agreement to proceed

## 2021-07-13 NOTE — Progress Notes (Signed)
[facility] OFFICE PROGRESS NOTE  Patient Care Team: Hamrick, Lorin Mercy, MD as PCP - General (Family Medicine) Heath Lark, MD as Consulting Physician (Hematology and Oncology)  ASSESSMENT & PLAN:  Laryngeal squamous cell carcinoma ([place]) I have reviewed multiple imaging studies with the patient I am not entirely convinced about the reading from radiologist I recommend we proceed with treatment and I will order echocardiogram to look at the mass in a slightly different way She is in agreement to proceed  Cardiac mass This was extensively evaluated several months ago She is on anticoagulation therapy I recommend echocardiogram for evaluation and she is in agreement  Tracheostomy dependence ([place]) She has intermittent bleeding around her tracheostomy site due to anticoagulation therapy I recommend reducing apixaban to 2.5 mg twice daily  Cancer associated pain She is able to reduce the amount of pain medicine required She will continue pain medicine taper as tolerated I have refilled her prescription pain medicine today  Orders Placed This Encounter  Procedures   ECHOCARDIOGRAM COMPLETE    Standing Status:   Future    Standing Expiration Date:   07/13/2022    Order Specific Question:   Where should this test be performed    Answer:   [facility]    Order Specific Question:   Perflutren DEFINITY (image enhancing agent) should be administered unless hypersensitivity or allergy exist    Answer:   Administer Perflutren    Order Specific Question:   Reason for exam-Echo    Answer:   Chemo  [id number]    Order Specific Question:   Other Comments    Answer:   mass/cancer/thrombus noted, on anticoagulation tx    All questions were answered. The patient knows to call the clinic with any problems, questions or concerns. The total time spent in the appointment was 30 minutes encounter with patients including review of chart and various tests results, discussions about plan of care  and coordination of care plan   Heath Lark, MD 07/13/2021 2:59 PM  INTERVAL HISTORY: Please see below for problem oriented charting. she returns for treatment follow-up on pembrolizumab for metastatic head and neck cancer to the lung Since last time I saw her, she noted slight intermittent bleeding around the tracheostomy site Her chronic pain is stable No other side effects from treatment  REVIEW OF SYSTEMS:   Constitutional: Denies fevers, chills or abnormal weight loss Eyes: Denies blurriness of vision Ears, nose, mouth, throat, and face: Denies mucositis or sore throat Respiratory: Denies cough, dyspnea or wheezes Cardiovascular: Denies palpitation, chest discomfort or lower extremity swelling Gastrointestinal:  Denies nausea, heartburn or change in bowel habits Skin: Denies abnormal skin rashes Lymphatics: Denies new lymphadenopathy or easy bruising Neurological:Denies numbness, tingling or new weaknesses Behavioral/Psych: Mood is stable, no new changes  All other systems were reviewed with the patient and are negative.  I have reviewed the past medical history, past surgical history, social history and family history with the patient and they are unchanged from previous note.  ALLERGIES:  has No Known Allergies.  MEDICATIONS:  Current Outpatient Medications  Medication Sig Dispense Refill   apixaban (ELIQUIS) 2.5 MG TABS tablet Take 1 tablet (2.5 mg total) by mouth 2 (two) times daily. 60 tablet 3   chlorhexidine (PERIDEX) 0.12 % solution Use as directed 15 mLs in the mouth or throat 2 (two) times daily. Rinse with 15 mls twice daily for 30 seconds. Use after breakfast and at bedtime. Spit out excess. Do not swallow.  473 mL 2   Cholecalciferol (VITAMIN D3) 125 MCG (5000 UT) CAPS Take 5,000 Units by mouth daily.     HYDROcodone-acetaminophen (NORCO/VICODIN) 5-325 MG tablet TAKE 1 TABLET BY MOUTH EVERY 6 HOURS AS NEEDED FOR MODERATE PAIN 60 tablet 0   levothyroxine (SYNTHROID)  137 MCG tablet Take 137 mcg by mouth daily before breakfast.     lidocaine-prilocaine (EMLA) cream Apply topically daily as needed. 30 g 9   Nutritional Supplements (KATE FARMS PEPTIDE 1.5) LIQD Take 4 Bottles by mouth daily. 325 mL 11   nystatin (MYCOSTATIN) 100000 UNIT/ML suspension Take 5 mLs by mouth 2 (two) times daily.     ondansetron (ZOFRAN) 8 MG tablet Take 1 tablet (8 mg total) by mouth 2 (two) times daily as needed (Nausea or vomiting). (Patient taking differently: Take 8 mg by mouth 2 (two) times daily as needed for nausea or vomiting (Nausea or vomiting).) 30 tablet 1   oxymetazoline (AFRIN) 0.05 % nasal spray Place 3 sprays into both nostrils 2 (two) times daily as needed (Bleeding from stoma). 30 mL 0   prochlorperazine (COMPAZINE) 10 MG tablet Take 1 tablet (10 mg total) by mouth every 6 (six) hours as needed (Nausea or vomiting). (Patient taking differently: Take 10 mg by mouth every 6 (six) hours as needed for nausea or vomiting (Nausea or vomiting).) 30 tablet 1   No current facility-administered medications for this visit.   Facility-Administered Medications Ordered in Other Visits  Medication Dose Route Frequency Provider Last Rate Last Admin   sodium chloride flush (NS) 0.9 % injection 10 mL  10 mL Intracatheter PRN Alvy Bimler, Pamelyn Bancroft, MD   10 mL at 07/13/21 1152    SUMMARY OF ONCOLOGIC HISTORY: Oncology History  Laryngeal squamous cell carcinoma ([place])  12/02/2018 Imaging   CT neck: IMPRESSION: 1. Supraglottic laryngeal carcinoma extending across the anterior commissure and invading the prelaryngeal fat, left aryepiglottic fold and left aspect of the epiglottis. 2. Severe narrowing of the laryngeal airway. 3. Bilateral subcentimeter level 2A and 2A cervical lymph nodes.     12/02/2018 Imaging   CT  Chest:  IMPRESSION: No evidence of metastatic disease in the chest.   12/03/2018 Pathology Results   (Care Everywhere; [facility]) A: Larynx, supraglottis, biopsy -  Atypical squamous proliferation with at least low grade dysplasia and abundant keratinization - No definite high grade dysplasia or invasive carcinoma represented (see comment)   12/18/2018 Pathology Results   (Care Everywhere; [facility]) A:  Neck, left, fine needle aspiration - Rare single atypical cells, no cells diagnostic of malignancy - Lymph node material present  B:  Neck, right, fine needle aspiration - Non-diagnostic specimen - No lymph node material identified - Mixed inflammation and blood   12/31/2018 Pathology Results   (Care Everywhere; [facility])  A: Larynx, left supraglottic mass, biopsy - Atypical squamous proliferation with abundant keratinization, suspicious for well-differentiated invasive squamous cell carcinoma  B: Larynx, left supraglottic mass, biopsy - Well-differentiated invasive squamous cell carcinoma with abundant keratinization (see comment)   01/18/2019 Initial Diagnosis   Laryngeal cancer ([place])   01/26/2019 Imaging   1. Locally advanced hypermetabolic laryngeal neoplasm centered in the left supraglottic space with bilateral glottic and epiglottic involvement and subglottic extension on the left. 2. Hypermetabolic bilateral level 3 neck nodal metastases. 3. Hypermetabolic high mediastinal nodal metastases between the trachea and upper thoracic esophagus. 4. Otherwise no distant hypermetabolic metastatic disease. 5. Nodular 7 mm focus at the left lung base, below PET resolution, recommend attention on  follow-up chest CT in 3 months. 6. Chronic findings include: Aortic Atherosclerosis (ICD10-I70.0). Cholelithiasis.   02/08/2019 Cancer Staging   Staging form: Larynx - Supraglottis, AJCC 8th Edition - Clinical stage from 02/08/2019: Stage IVA (cT3, cN2c, cM0) - Signed by Eppie Gibson, MD on 02/08/2019    02/11/2019 - 04/07/2019 Chemotherapy   The patient weekly cisplatin for chemotherapy treatment.     07/20/2019 Pathology Results   PROCEDURE: Total  glossectomy, total pharyngectomy, total laryngectomy, total thyroidectomy, and cervical esophagectomy TUMOR SITE: Larynx TUMOR LATERALITY: Not specified TUMOR FOCALITY: Unifocal TUMOR SIZE:    GREATEST DIMENSION: 6.9 cm    ADDITIONAL DIMENSIONS: 5.4 x 2.4 cm HISTOLOGIC TYPE: Squamous cell carcinoma, conventional (keratinizing) HISTOLOGIC GRADE:  G1-G2: Well to moderately differentiated TUMOR EXTENSION: Tumor involves the posterior tongue, pharynx, larynx, tracheal cartilage, hyoid bone, cervical esophagus, vagus nerve, carotid artery, deep cervical fascia, ans surrounding skeletal muscle and soft tissue MARGINS: Uninvolved by invasive tumor; Perineural invasion is less than 37mm from the left soft tissue margin LYMPHOVASCULAR INVASION: Not identified PERINEURAL INVASION:  Present REGIONAL LYMPH NODES:     NUMBER OF LYMPH NODES INVOLVED: 2     NUMBER OF LYMPH NODES EXAMINED: 24     LATERALITY OF LYMPH NODES INVOLVED: Cannot determine     SIZE OF LARGEST METASTATIC DEPOSIT: 0.8 cm     EXTRANODAL EXTENSION: Not identified PATHOLOGIC STAGE CLASSIFICATION (pTNM, AJCC 8TH Ed): (r)(y) pT4b pN2a DISTANT METASTASIS (pM): ADDITIONAL PATHOLOGIC FINDINGS: Previous chemoradiation   11/04/2019 Imaging   PET: 1.  Hypermetabolic bilateral neck lesions as detailed above are concerning for disease recurrence. 2.  Compared to 07/08/2019 PET scan, interval development of subcentimeter bilateral upper lobe pulmonary nodules, size below PET resolution. These are concerning for metastasis. Consider diagnostic quality CT chest exam for further evaluation as per oncology treatment planning. 3.  Focal hypermetabolic uptake in right ventricular cavity of indeterminate significance. Recommend echocardiographic examination to rule out underlying thrombus. This finding was discussed with Dr. Conley Canal by Dr. Clementeen Graham and Dr. Marcello Moores on 11/04/2019 at approximately 3:00 PM via telephone. 4.  Ancillary CT findings as  above.   11/05/2019 Imaging   Echocardiogram: Summary   1. The left ventricle is normal in size with normal wall thickness.   2. The left ventricular systolic function is normal, LVEF is visually estimated at 60-65%.   3. The right ventricle is normal in size, with normal systolic function.   4. There is an extrinsic compression of the RV and RA laterally at the AV groove by a mass of 4.3 cm diameter. Unclear if the mass is invasive, although no pericardial effusion seen suggestive no invasion possibly. There is no evidence of hemodynamic compromise secondary to cardiac compression with normal IVC diameter.   12/06/2019 Pathology Results   A. LYMPH NODE, LEFT SUBMANDIBULAR, BIOPSY:  - Squamous cell carcinoma.  - No distinct nodal tissue identified.    12/06/2019 Procedure   Successful ultrasound-guided core biopsy of centrally necrotic left submandibular lymph node. Of note, due to extensive prior postsurgical and post radiation changes, patient has very limited mobility in the neck and the biopsy was extremely challenging    12/16/2019 -  Chemotherapy   The patient had pembrolizumab for chemotherapy treatment.     03/08/2020 PET scan   1. Diminished size of masses in the neck as described. Persistent increased metabolic activity particularly in the LEFT neck. Some of this diminished size on the LEFT could be due to decompression of necrotic material. Correlate with any  symptoms of inflammation or infection in this area as well given the paucity of residual soft tissue associated with the above abnormality, overall findings are compatible with residual disease in this location and there is no significant surrounding stranding to indicate inflammation that would correlate with the degree of FDG uptake remaining. 2. Diminished activity in the juxta cardiac mass with peripheral activity suggesting interval necrosis, on the prior study this was uniformly hypermetabolic. There is still  considerable FDG uptake. 3. No signs of disease in the abdomen or pelvis. 4. 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 Correlate with any new pain or symptoms in this area with further imaging as warranted. No discrete mass or bone destruction on today's study.   06/08/2020 Imaging   CT neck 1. Status post laryngectomy and neck dissection without lymphadenopathy or mass lesion. 2. Short segment severe stenosis of the left internal carotid artery adjacent to a surgical clip.     06/08/2020 Imaging   CT chest 1. There is a large, hypodense lesion within, or very closely abutting and likely intruding upon the right ventricle, as seen on prior examination and approximately 4.6 x 4.3 cm, poorly evaluated due to cardiac motion. Findings are consistent with metastatic lesion and/or thrombus and poorly assessed by ungated CT. Consider echocardiography and cardiac MRI for further assessment.   2. Multiple small pulmonary nodules in the bilateral lung apices are unchanged. Attention on follow-up.   3.  No evidence of new metastatic disease in the chest.   4.  Aortic Atherosclerosis (ICD10-I70.0).     09/07/2020 Imaging   1. Mild decrease in size of juxta cardiac metastatic involving the right ventricle. 2. Small pulmonary nodules are unchanged from previous exam. 3. Aortic atherosclerosis. 4. Gallstone.     12/14/2020 Procedure   Successful exchange and up size from 20 French pull-through gastrostomy to 22 French balloon retention gastrostomy.   03/30/2021 Imaging   Ct neck 1.  In comparison with PET/CT from February 2021, marked interval decrease in size of soft tissue mass along the angle of the left mandible consistent with a positive treatment response. However, there are new destructive bony changes along the lingual surface of the mandible in this region with appearance of soft tissue infiltrating the marrow cavity. While concerning for neoplastic involvement, changes could also represent  other nonspecific osteonecrosis including infection or sequela of prior radiation treatment. Recommend correlation with direct visual inspection. Further evaluation with MRI may be of value.  2.  Centrally necrotic 1.3 cm right level IIa lymph node corresponding with previously described hypermetabolic lesion, decreased in size from prior. No suspicious new or enlarged lymph nodes in the neck.  3.  Enlarged right lower paratracheal lymph node suspected in the mediastinum, incompletely imaged. Consider dedicated CT imaging of the chest.  4.  Scattered pulmonary nodules in the imaged lung apices, the largest a 9 mm lesion in the right upper lobe. Recommend comparison with outside imaging ofthe chest if available.   05/03/2021 Echocardiogram    1. Left ventricular ejection fraction, by estimation, is 55 to 60%. The left ventricle has normal function. The left ventricle has no regional wall motion abnormalities. There is mild left ventricular hypertrophy. Left ventricular diastolic parameters are consistent with Grade I diastolic dysfunction (impaired relaxation).  2. Prominent echogenic structure on the RV free wall, possibly attached to the TV - measuring about 2.0 cm in diameter and 3.0 cm in length - some mobile filamentous material is attached, suggestive of thrombus or less likely  tumor.. Right ventricular systolic function is normal. The right ventricular size is normal.  3. The mitral valve is grossly normal. Trivial mitral valve regurgitation.  4. The aortic valve is tricuspid. Aortic valve regurgitation is not visualized.     05/04/2021 Imaging   RIGHT: - There is no evidence of deep vein thrombosis in the lower extremity.   - No cystic structure found in the popliteal fossa.   LEFT: - There is no evidence of deep vein thrombosis in the lower extremity.   - No cystic structure found in the popliteal fossa.     07/12/2021 Imaging   1. A large, heterogeneous mass about and within the right  ventricle is difficult to clearly assess due to cardiac motion and mixing artifact on this non tailored examination, but suspect it has significantly increased in size, and now involves the right atrium in addition to the right ventricle and pulmonary outflow tract, measuring approximately 6.9 x 4.9 cm, previously 4.5 x 4.2 cm when measured similarly. This may reflect a combination of metastatic soft tissue and thrombus, and could be more clearly assessed by echocardiography and cardiac MRI. 2. No significant change of a right upper lobe pulmonary nodule, measuring 0.8 cm, which remains modestly suspicious for a metastasis. Other small nodules are likewise stable. Attention on follow-up. 3. Status post laryngectomy and tracheostomy. 4. Coronary artery disease.     PHYSICAL EXAMINATION: ECOG PERFORMANCE STATUS: 1 - Symptomatic but completely ambulatory  Vitals:   07/13/21 0847  BP: 104/71  Pulse: 85  Resp: 18  Temp: 98.1 F (36.7 C)  SpO2: 98%   Filed Weights   07/13/21 0847  Weight: 144 lb 6.4 oz (65.5 kg)    GENERAL:alert, no distress and comfortable SKIN: skin color, texture, turgor are normal, no rashes or significant lesions EYES: normal, Conjunctiva are pink and non-injected, sclera clear OROPHARYNX:no exudate, no erythema and lips, buccal mucosa, and tongue normal  NECK: supple, thyroid normal size, non-tender, without nodularity LYMPH:  no palpable lymphadenopathy in the cervical, axillary or inguinal LUNGS: clear to auscultation and percussion with normal breathing effort HEART: regular rate & rhythm and no murmurs and no lower extremity edema ABDOMEN:abdomen soft, non-tender and normal bowel sounds Musculoskeletal:no cyanosis of digits and no clubbing  NEURO: alert & oriented x 3 no focal motor/sensory deficits  LABORATORY DATA:  I have reviewed the data as listed    Component Value Date/Time   NA 141 07/11/2021 0921   K 4.0 07/11/2021 0921   CL 104 07/11/2021 0921    CO2 25 07/11/2021 0921   GLUCOSE 103 (H) 07/11/2021 0921   BUN 22 07/11/2021 0921   CREATININE 0.75 07/11/2021 0921   CREATININE 0.71 04/13/2021 0827   CALCIUM 9.4 07/11/2021 0921   PROT 7.5 07/11/2021 0921   ALBUMIN 4.1 07/11/2021 0921   AST 20 07/11/2021 0921   AST 22 04/13/2021 0827   ALT 13 07/11/2021 0921   ALT 17 04/13/2021 0827   ALKPHOS 63 07/11/2021 0921   BILITOT 0.3 07/11/2021 0921   BILITOT 0.5 04/13/2021 0827   GFRNONAA >60 07/11/2021 0921   GFRNONAA >60 04/13/2021 0827   GFRAA >60 06/08/2020 1303   GFRAA >60 02/17/2020 1128    No results found for: SPEP, UPEP  Lab Results  Component Value Date   WBC 4.4 07/11/2021   NEUTROABS 2.5 07/11/2021   HGB 13.3 07/11/2021   HCT 40.1 07/11/2021   MCV 85.7 07/11/2021   PLT 225 07/11/2021  Chemistry      Component Value Date/Time   NA 141 07/11/2021 0921   K 4.0 07/11/2021 0921   CL 104 07/11/2021 0921   CO2 25 07/11/2021 0921   BUN 22 07/11/2021 0921   CREATININE 0.75 07/11/2021 0921   CREATININE 0.71 04/13/2021 0827      Component Value Date/Time   CALCIUM 9.4 07/11/2021 0921   ALKPHOS 63 07/11/2021 0921   AST 20 07/11/2021 0921   AST 22 04/13/2021 0827   ALT 13 07/11/2021 0921   ALT 17 04/13/2021 0827   BILITOT 0.3 07/11/2021 0921   BILITOT 0.5 04/13/2021 0827       RADIOGRAPHIC STUDIES: I have reviewed multiple imaging studies with the patient and son I have personally reviewed the radiological images as listed and agreed with the findings in the report. CT CHEST W CONTRAST  Result Date: 07/12/2021 CLINICAL DATA:  Laryngeal cancer, assess treatment response, assess intracardiac thrombus EXAM: CT CHEST WITH CONTRAST TECHNIQUE: Multidetector CT imaging of the chest was performed during intravenous contrast administration. CONTRAST:  59mL OMNIPAQUE IOHEXOL 350 MG/ML SOLN COMPARISON:  04/19/2021 FINDINGS: Cardiovascular: Right chest port catheter. Aortic atherosclerosis. A large, heterogeneous  mass about and within the right ventricle is difficult to clearly assess due to cardiac motion and mixing artifact on this non tailored examination, but suspect it has significantly increased in size, and now involves the right atrium in addition to the right ventricle and pulmonary outflow tract, measuring approximately 6.9 x 4.9 cm, previously 4.5 x 4.2 cm when measured similarly (series 2, image 102). Left and right coronary artery calcifications. No pericardial effusion. Mediastinum/Nodes: Unchanged prominent pretracheal lymph nodes, measuring up to 1.3 x 1.3 cm without overtly enlarged mediastinal or hilar lymphadenopathy. Status post laryngectomy and tracheostomy. Thyroid gland and esophagus demonstrate no significant findings. Lungs/Pleura: Minimal biapical radiation fibrosis. No significant change of a right upper lobe pulmonary nodule, measuring 0.8 cm (series 5, image 34). No pleural effusion or pneumothorax. Upper Abdomen: No acute abnormality. Musculoskeletal: No chest wall mass or suspicious bone lesions identified. IMPRESSION: 1. A large, heterogeneous mass about and within the right ventricle is difficult to clearly assess due to cardiac motion and mixing artifact on this non tailored examination, but suspect it has significantly increased in size, and now involves the right atrium in addition to the right ventricle and pulmonary outflow tract, measuring approximately 6.9 x 4.9 cm, previously 4.5 x 4.2 cm when measured similarly. This may reflect a combination of metastatic soft tissue and thrombus, and could be more clearly assessed by echocardiography and cardiac MRI. 2. No significant change of a right upper lobe pulmonary nodule, measuring 0.8 cm, which remains modestly suspicious for a metastasis. Other small nodules are likewise stable. Attention on follow-up. 3. Status post laryngectomy and tracheostomy. 4. Coronary artery disease. Aortic Atherosclerosis (ICD10-I70.0). Electronically Signed    By: Delanna Ahmadi M.D.   On: 07/12/2021 11:00

## 2021-07-13 NOTE — Assessment & Plan Note (Signed)
She has intermittent bleeding around her tracheostomy site due to anticoagulation therapy I recommend reducing apixaban to 2.5 mg twice daily

## 2021-07-13 NOTE — Telephone Encounter (Signed)
[facility] and given appt for Echo on 11/1 at 9 am, arrive at [place] to [facility] main entrance. She verbalized understanding.

## 2021-07-24 ENCOUNTER — Other Ambulatory Visit: Payer: Self-pay

## 2021-07-24 ENCOUNTER — Ambulatory Visit (HOSPITAL_COMMUNITY)
Admission: RE | Admit: 2021-07-24 | Discharge: 2021-07-24 | Disposition: A | Discharge status: Home / Self Care | Payer: Medicare HMO | Source: Ambulatory Visit | Attending: Hematology and Oncology | Admitting: Hematology and Oncology

## 2021-07-24 DIAGNOSIS — Z0189 Encounter for other specified special examinations: Secondary | ICD-10-CM

## 2021-07-24 DIAGNOSIS — I5189 Other ill-defined heart diseases: Secondary | ICD-10-CM | POA: Diagnosis not present

## 2021-07-24 DIAGNOSIS — C321 Malignant neoplasm of supraglottis: Secondary | ICD-10-CM | POA: Insufficient documentation

## 2021-07-24 DIAGNOSIS — Z01810 Encounter for preprocedural cardiovascular examination: Secondary | ICD-10-CM | POA: Diagnosis not present

## 2021-07-24 LAB — ECHOCARDIOGRAM COMPLETE
Area-P 1/2: 3.17 cm2
S' Lateral: 2.9 cm

## 2021-07-24 NOTE — Progress Notes (Signed)
  Echocardiogram 2D Echocardiogram has been performed.  Fidel Levy 07/24/2021, 10:04 AM

## 2021-07-26 ENCOUNTER — Other Ambulatory Visit: Payer: Self-pay

## 2021-07-26 ENCOUNTER — Encounter: Payer: Self-pay | Admitting: Hematology and Oncology

## 2021-07-26 ENCOUNTER — Inpatient Hospital Stay: Payer: Medicare HMO | Attending: Hematology | Admitting: Hematology and Oncology

## 2021-07-26 VITALS — BP 119/99 | HR 83 | Temp 97.5°F | Resp 18 | Ht 62.0 in | Wt 144.4 lb

## 2021-07-26 DIAGNOSIS — C321 Malignant neoplasm of supraglottis: Secondary | ICD-10-CM | POA: Diagnosis present

## 2021-07-26 DIAGNOSIS — Z5111 Encounter for antineoplastic chemotherapy: Secondary | ICD-10-CM | POA: Insufficient documentation

## 2021-07-26 DIAGNOSIS — Z7901 Long term (current) use of anticoagulants: Secondary | ICD-10-CM | POA: Insufficient documentation

## 2021-07-26 DIAGNOSIS — I5189 Other ill-defined heart diseases: Secondary | ICD-10-CM | POA: Diagnosis not present

## 2021-07-26 DIAGNOSIS — C7989 Secondary malignant neoplasm of other specified sites: Secondary | ICD-10-CM | POA: Diagnosis not present

## 2021-07-26 DIAGNOSIS — Z5112 Encounter for antineoplastic immunotherapy: Secondary | ICD-10-CM | POA: Diagnosis present

## 2021-07-26 DIAGNOSIS — Z79899 Other long term (current) drug therapy: Secondary | ICD-10-CM | POA: Diagnosis not present

## 2021-07-26 NOTE — Assessment & Plan Note (Signed)
I have reviewed multiple imaging studies with the patient and her partner It is highly unusual to see metastatic cancer into the heart Per recommendation by imaging/cardiologist, we will proceed with cardiac MRI for further evaluation I am hopeful we can get it done before her next appointment next week

## 2021-07-26 NOTE — Progress Notes (Signed)
[place] OFFICE PROGRESS NOTE  Patient Care Team: Hamrick, Lorin Mercy, MD as PCP - General (Family Medicine) Heath Lark, MD as Consulting Physician (Hematology and Oncology)  ASSESSMENT & PLAN:  Cardiac mass I have reviewed multiple imaging studies with the patient and her partner It is highly unusual to see metastatic cancer into the heart Per recommendation by imaging/cardiologist, we will proceed with cardiac MRI for further evaluation I am hopeful we can get it done before her next appointment next week  Orders Placed This Encounter  Procedures   MR CARDIAC MORPHOLOGY W WO CONTRAST    Standing Status:   Future    Standing Expiration Date:   07/26/2022    Order Specific Question:   If indicated for the ordered procedure, I authorize the administration of contrast media per Radiology protocol    Answer:   Yes    Order Specific Question:   What is the patient's sedation requirement?    Answer:   No Sedation    Order Specific Question:   Does the patient have a pacemaker or implanted devices?    Answer:   No    Order Specific Question:   Preferred imaging location?    Answer:   [facility] (table limit - 550 lbs)    All questions were answered. The patient knows to call the clinic with any problems, questions or concerns. The total time spent in the appointment was 20 minutes encounter with patients including review of chart and various tests results, discussions about plan of care and coordination of care plan   Heath Lark, MD 07/26/2021 12:37 PM  INTERVAL HISTORY: Please see below for problem oriented charting. she returns to review test results with her partner She has less bleeding complications since we dropped the dose of Eliquis Denies chest pain or shortness of breath I have reviewed multiple imaging studies with her  REVIEW OF SYSTEMS:   All other systems were reviewed with the patient and are negative.  I have reviewed the past medical  history, past surgical history, social history and family history with the patient and they are unchanged from previous note.  ALLERGIES:  has No Known Allergies.  MEDICATIONS:  Current Outpatient Medications  Medication Sig Dispense Refill   apixaban (ELIQUIS) 2.5 MG TABS tablet Take 1 tablet (2.5 mg total) by mouth 2 (two) times daily. 60 tablet 3   chlorhexidine (PERIDEX) 0.12 % solution Use as directed 15 mLs in the mouth or throat 2 (two) times daily. Rinse with 15 mls twice daily for 30 seconds. Use after breakfast and at bedtime. Spit out excess. Do not swallow. 473 mL 2   Cholecalciferol (VITAMIN D3) 125 MCG (5000 UT) CAPS Take 5,000 Units by mouth daily.     HYDROcodone-acetaminophen (NORCO/VICODIN) 5-325 MG tablet TAKE 1 TABLET BY MOUTH EVERY 6 HOURS AS NEEDED FOR MODERATE PAIN 60 tablet 0   levothyroxine (SYNTHROID) 137 MCG tablet Take 137 mcg by mouth daily before breakfast.     lidocaine-prilocaine (EMLA) cream Apply topically daily as needed. 30 g 9   Nutritional Supplements (KATE FARMS PEPTIDE 1.5) LIQD Take 4 Bottles by mouth daily. 325 mL 11   nystatin (MYCOSTATIN) 100000 UNIT/ML suspension Take 5 mLs by mouth 2 (two) times daily.     ondansetron (ZOFRAN) 8 MG tablet Take 1 tablet (8 mg total) by mouth 2 (two) times daily as needed (Nausea or vomiting). (Patient taking differently: Take 8 mg by mouth 2 (two) times daily as  needed for nausea or vomiting (Nausea or vomiting).) 30 tablet 1   oxymetazoline (AFRIN) 0.05 % nasal spray Place 3 sprays into both nostrils 2 (two) times daily as needed (Bleeding from stoma). 30 mL 0   prochlorperazine (COMPAZINE) 10 MG tablet Take 1 tablet (10 mg total) by mouth every 6 (six) hours as needed (Nausea or vomiting). (Patient taking differently: Take 10 mg by mouth every 6 (six) hours as needed for nausea or vomiting (Nausea or vomiting).) 30 tablet 1   No current facility-administered medications for this visit.    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 Supraglottic laryngeal carcinoma extending across the anterior commissure and invading the prelaryngeal fat, left aryepiglottic fold and left aspect of the epiglottis. 2. Severe narrowing of the laryngeal airway. 3. Bilateral subcentimeter level 2A and 2A cervical lymph nodes.     12/02/2018 Imaging   CT  Chest:  IMPRESSION: No evidence of metastatic disease in the chest.   12/03/2018 Pathology Results   (Care Everywhere; [facility]) A: Larynx, supraglottis, biopsy - Atypical squamous proliferation with at least low grade dysplasia and abundant keratinization - No definite high grade dysplasia or invasive carcinoma represented (see comment)   12/18/2018 Pathology Results   (Care Everywhere; [facility]) A:  Neck, left, fine needle aspiration - Rare single atypical cells, no cells diagnostic of malignancy - Lymph node material present  B:  Neck, right, fine needle aspiration - Non-diagnostic specimen - No lymph node material identified - Mixed inflammation and blood   12/31/2018 Pathology Results   (Care Everywhere; [facility])  A: Larynx, left supraglottic mass, biopsy - Atypical squamous proliferation with abundant keratinization, suspicious for well-differentiated invasive squamous cell carcinoma  B: Larynx, left supraglottic mass, biopsy - Well-differentiated invasive squamous cell carcinoma with abundant keratinization (see comment)   01/18/2019 Initial Diagnosis   Laryngeal cancer ([place])   01/26/2019 Imaging   1. Locally advanced hypermetabolic laryngeal neoplasm centered in the left supraglottic space with bilateral glottic and epiglottic involvement and subglottic extension on the left. 2. Hypermetabolic bilateral level 3 neck nodal metastases. 3. Hypermetabolic high mediastinal nodal metastases between the trachea and upper thoracic esophagus. 4.  Otherwise no distant hypermetabolic metastatic disease. 5. Nodular 7 mm focus at the left lung base, below PET resolution, recommend attention on follow-up chest CT in 3 months. 6. Chronic findings include: Aortic Atherosclerosis (ICD10-I70.0). Cholelithiasis.   02/08/2019 Cancer Staging   Staging form: Larynx - Supraglottis, AJCC 8th Edition - Clinical stage from 02/08/2019: Stage IVA (cT3, cN2c, cM0) - Signed by Eppie Gibson, MD on 02/08/2019    02/11/2019 - 04/07/2019 Chemotherapy   The patient weekly cisplatin for chemotherapy treatment.     07/20/2019 Pathology Results   PROCEDURE: Total glossectomy, total pharyngectomy, total laryngectomy, total thyroidectomy, and cervical esophagectomy TUMOR SITE: Larynx TUMOR LATERALITY: Not specified TUMOR FOCALITY: Unifocal TUMOR SIZE:    GREATEST DIMENSION: 6.9 cm    ADDITIONAL DIMENSIONS: 5.4 x 2.4 cm HISTOLOGIC TYPE: Squamous cell carcinoma, conventional (keratinizing) HISTOLOGIC GRADE:  G1-G2: Well to moderately differentiated TUMOR EXTENSION: Tumor involves the posterior tongue, pharynx, larynx, tracheal cartilage, hyoid bone, cervical esophagus, vagus nerve, carotid artery, deep cervical fascia, ans surrounding skeletal muscle and soft tissue MARGINS: Uninvolved by invasive tumor; Perineural invasion is less than 63mm from the left soft tissue margin LYMPHOVASCULAR INVASION: Not identified PERINEURAL INVASION:  Present REGIONAL LYMPH NODES:     NUMBER OF LYMPH  NODES INVOLVED: 2     NUMBER OF LYMPH NODES EXAMINED: 24     LATERALITY OF LYMPH NODES INVOLVED: Cannot determine     SIZE OF LARGEST METASTATIC DEPOSIT: 0.8 cm     EXTRANODAL EXTENSION: Not identified PATHOLOGIC STAGE CLASSIFICATION (pTNM, AJCC 8TH Ed): (r)(y) pT4b pN2a DISTANT METASTASIS (pM): ADDITIONAL PATHOLOGIC FINDINGS: Previous chemoradiation   11/04/2019 Imaging   PET: 1.  Hypermetabolic bilateral neck lesions as detailed above are concerning for disease  recurrence. 2.  Compared to 07/08/2019 PET scan, interval development of subcentimeter bilateral upper lobe pulmonary nodules, size below PET resolution. These are concerning for metastasis. Consider diagnostic quality CT chest exam for further evaluation as per oncology treatment planning. 3.  Focal hypermetabolic uptake in right ventricular cavity of indeterminate significance. Recommend echocardiographic examination to rule out underlying thrombus. This finding was discussed with Dr. Conley Canal by Dr. Clementeen Graham and Dr. Marcello Moores on 11/04/2019 at approximately 3:00 PM via telephone. 4.  Ancillary CT findings as above.   11/05/2019 Imaging   Echocardiogram: Summary   1. The left ventricle is normal in size with normal wall thickness.   2. The left ventricular systolic function is normal, LVEF is visually estimated at 60-65%.   3. The right ventricle is normal in size, with normal systolic function.   4. There is an extrinsic compression of the RV and RA laterally at the AV groove by a mass of 4.3 cm diameter. Unclear if the mass is invasive, although no pericardial effusion seen suggestive no invasion possibly. There is no evidence of hemodynamic compromise secondary to cardiac compression with normal IVC diameter.   12/06/2019 Pathology Results   A. LYMPH NODE, LEFT SUBMANDIBULAR, BIOPSY:  - Squamous cell carcinoma.  - No distinct nodal tissue identified.    12/06/2019 Procedure   Successful ultrasound-guided core biopsy of centrally necrotic left submandibular lymph node. Of note, due to extensive prior postsurgical and post radiation changes, patient has very limited mobility in the neck and the biopsy was extremely challenging    12/16/2019 -  Chemotherapy   The patient had pembrolizumab for chemotherapy treatment.     03/08/2020 PET scan   1. Diminished size of masses in the neck as described. Persistent increased metabolic activity particularly in the LEFT neck. Some of this diminished size on  the LEFT could be due to decompression of necrotic material. Correlate with any symptoms of inflammation or infection in this area as well given the paucity of residual soft tissue associated with the above abnormality, overall findings are compatible with residual disease in this location and there is no significant surrounding stranding to indicate inflammation that would correlate with the degree of FDG uptake remaining. 2. Diminished activity in the juxta cardiac mass with peripheral activity suggesting interval necrosis, on the prior study this was uniformly hypermetabolic. There is still considerable FDG uptake. 3. No signs of disease in the abdomen or pelvis. 4. 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 Correlate with any new pain or symptoms in this area with further imaging as warranted. No discrete mass or bone destruction on today's study.   06/08/2020 Imaging   CT neck 1. Status post laryngectomy and neck dissection without lymphadenopathy or mass lesion. 2. Short segment severe stenosis of the left internal carotid artery adjacent to a surgical clip.     06/08/2020 Imaging   CT chest 1. There is a large, hypodense lesion within, or very closely abutting and likely intruding upon the right ventricle, as seen on prior examination  and approximately 4.6 x 4.3 cm, poorly evaluated due to cardiac motion. Findings are consistent with metastatic lesion and/or thrombus and poorly assessed by ungated CT. Consider echocardiography and cardiac MRI for further assessment.   2. Multiple small pulmonary nodules in the bilateral lung apices are unchanged. Attention on follow-up.   3.  No evidence of new metastatic disease in the chest.   4.  Aortic Atherosclerosis (ICD10-I70.0).     09/07/2020 Imaging   1. Mild decrease in size of juxta cardiac metastatic involving the right ventricle. 2. Small pulmonary nodules are unchanged from previous exam. 3. Aortic atherosclerosis. 4. Gallstone.      12/14/2020 Procedure   Successful exchange and up size from 20 French pull-through gastrostomy to 22 French balloon retention gastrostomy.   03/30/2021 Imaging   Ct neck 1.  In comparison with PET/CT from February 2021, marked interval decrease in size of soft tissue mass along the angle of the left mandible consistent with a positive treatment response. However, there are new destructive bony changes along the lingual surface of the mandible in this region with appearance of soft tissue infiltrating the marrow cavity. While concerning for neoplastic involvement, changes could also represent other nonspecific osteonecrosis including infection or sequela of prior radiation treatment. Recommend correlation with direct visual inspection. Further evaluation with MRI may be of value.  2.  Centrally necrotic 1.3 cm right level IIa lymph node corresponding with previously described hypermetabolic lesion, decreased in size from prior. No suspicious new or enlarged lymph nodes in the neck.  3.  Enlarged right lower paratracheal lymph node suspected in the mediastinum, incompletely imaged. Consider dedicated CT imaging of the chest.  4.  Scattered pulmonary nodules in the imaged lung apices, the largest a 9 mm lesion in the right upper lobe. Recommend comparison with outside imaging ofthe chest if available.   05/03/2021 Echocardiogram    1. Left ventricular ejection fraction, by estimation, is 55 to 60%. The left ventricle has normal function. The left ventricle has no regional wall motion abnormalities. There is mild left ventricular hypertrophy. Left ventricular diastolic parameters are consistent with Grade I diastolic dysfunction (impaired relaxation).  2. Prominent echogenic structure on the RV free wall, possibly attached to the TV - measuring about 2.0 cm in diameter and 3.0 cm in length - some mobile filamentous material is attached, suggestive of thrombus or less likely tumor.. Right ventricular systolic  function is normal. The right ventricular size is normal.  3. The mitral valve is grossly normal. Trivial mitral valve regurgitation.  4. The aortic valve is tricuspid. Aortic valve regurgitation is not visualized.     05/04/2021 Imaging   RIGHT: - There is no evidence of deep vein thrombosis in the lower extremity.   - No cystic structure found in the popliteal fossa.   LEFT: - There is no evidence of deep vein thrombosis in the lower extremity.   - No cystic structure found in the popliteal fossa.     07/12/2021 Imaging   1. A large, heterogeneous mass about and within the right ventricle is difficult to clearly assess due to cardiac motion and mixing artifact on this non tailored examination, but suspect it has significantly increased in size, and now involves the right atrium in addition to the right ventricle and pulmonary outflow tract, measuring approximately 6.9 x 4.9 cm, previously 4.5 x 4.2 cm when measured similarly. This may reflect a combination of metastatic soft tissue and thrombus, and could be more clearly assessed by echocardiography  and cardiac MRI. 2. No significant change of a right upper lobe pulmonary nodule, measuring 0.8 cm, which remains modestly suspicious for a metastasis. Other small nodules are likewise stable. Attention on follow-up. 3. Status post laryngectomy and tracheostomy. 4. Coronary artery disease.   07/24/2021 Echocardiogram    1. There is an echogenic RV and RVOT mass. Attachment appears to be on the right ventricular mid wall. Mass is 32 by 24 mm, though may be underestimated. Right ventricular systolic function is normal. The right ventricular size is normal. There is normal pulmonary artery systolic pressure.  2. Left ventricular ejection fraction, by estimation, is 60 to 65%. The left ventricle has normal function. The left ventricle has no regional wall motion abnormalities. Left ventricular diastolic parameters are consistent with Grade I  diastolic dysfunction (impaired relaxation). The average left ventricular global longitudinal strain is -19.0 %. The global longitudinal strain is normal.  3. The mitral valve is normal in structure. No evidence of mitral valve regurgitation.  4. The aortic valve is tricuspid. Aortic valve regurgitation is not visualized. No aortic stenosis is present.   Comparison(s): A prior study was performed on 05/03/21. Prior images reviewed side by side. Similar size of RV mass to slight increase. Do not see right atrial involvement in this study.   Conclusion(s)/Recommendation(s): If concerned for tumor vs thombus, cardiac MRI could be considered     PHYSICAL EXAMINATION: ECOG PERFORMANCE STATUS: 0 - Asymptomatic  Vitals:   07/26/21 0838  BP: (!) 119/99  Pulse: 83  Resp: 18  Temp: (!) 97.5 F (36.4 C)  SpO2: 98%   Filed Weights   07/26/21 0838  Weight: 144 lb 6.4 oz (65.5 kg)    GENERAL:alert, no distress and comfortable NEURO: alert & oriented x 3 with fluent speech, no focal motor/sensory deficits  LABORATORY DATA:  I have reviewed the data as listed    Component Value Date/Time   NA 141 07/11/2021 0921   K 4.0 07/11/2021 0921   CL 104 07/11/2021 0921   CO2 25 07/11/2021 0921   GLUCOSE 103 (H) 07/11/2021 0921   BUN 22 07/11/2021 0921   CREATININE 0.75 07/11/2021 0921   CREATININE 0.71 04/13/2021 0827   CALCIUM 9.4 07/11/2021 0921   PROT 7.5 07/11/2021 0921   ALBUMIN 4.1 07/11/2021 0921   AST 20 07/11/2021 0921   AST 22 04/13/2021 0827   ALT 13 07/11/2021 0921   ALT 17 04/13/2021 0827   ALKPHOS 63 07/11/2021 0921   BILITOT 0.3 07/11/2021 0921   BILITOT 0.5 04/13/2021 0827   GFRNONAA >60 07/11/2021 0921   GFRNONAA >60 04/13/2021 0827   GFRAA >60 06/08/2020 1303   GFRAA >60 02/17/2020 1128    No results found for: SPEP, UPEP  Lab Results  Component Value Date   WBC 4.4 07/11/2021   NEUTROABS 2.5 07/11/2021   HGB 13.3 07/11/2021   HCT 40.1 07/11/2021   MCV 85.7  07/11/2021   PLT 225 07/11/2021      Chemistry      Component Value Date/Time   NA 141 07/11/2021 0921   K 4.0 07/11/2021 0921   CL 104 07/11/2021 0921   CO2 25 07/11/2021 0921   BUN 22 07/11/2021 0921   CREATININE 0.75 07/11/2021 0921   CREATININE 0.71 04/13/2021 0827      Component Value Date/Time   CALCIUM 9.4 07/11/2021 0921   ALKPHOS 63 07/11/2021 0921   AST 20 07/11/2021 0921   AST 22 04/13/2021 0827   ALT 13 07/11/2021  0921   ALT 17 04/13/2021 0827   BILITOT 0.3 07/11/2021 0921   BILITOT 0.5 04/13/2021 0827       RADIOGRAPHIC STUDIES: I have reviewed multiple imaging studies with the patient I have personally reviewed the radiological images as listed and agreed with the findings in the report. CT CHEST W CONTRAST  Result Date: 07/12/2021 CLINICAL DATA:  Laryngeal cancer, assess treatment response, assess intracardiac thrombus EXAM: CT CHEST WITH CONTRAST TECHNIQUE: Multidetector CT imaging of the chest was performed during intravenous contrast administration. CONTRAST:  9mL OMNIPAQUE IOHEXOL 350 MG/ML SOLN COMPARISON:  04/19/2021 FINDINGS: Cardiovascular: Right chest port catheter. Aortic atherosclerosis. A large, heterogeneous mass about and within the right ventricle is difficult to clearly assess due to cardiac motion and mixing artifact on this non tailored examination, but suspect it has significantly increased in size, and now involves the right atrium in addition to the right ventricle and pulmonary outflow tract, measuring approximately 6.9 x 4.9 cm, previously 4.5 x 4.2 cm when measured similarly (series 2, image 102). Left and right coronary artery calcifications. No pericardial effusion. Mediastinum/Nodes: Unchanged prominent pretracheal lymph nodes, measuring up to 1.3 x 1.3 cm without overtly enlarged mediastinal or hilar lymphadenopathy. Status post laryngectomy and tracheostomy. Thyroid gland and esophagus demonstrate no significant findings. Lungs/Pleura:  Minimal biapical radiation fibrosis. No significant change of a right upper lobe pulmonary nodule, measuring 0.8 cm (series 5, image 34). No pleural effusion or pneumothorax. Upper Abdomen: No acute abnormality. Musculoskeletal: No chest wall mass or suspicious bone lesions identified. IMPRESSION: 1. A large, heterogeneous mass about and within the right ventricle is difficult to clearly assess due to cardiac motion and mixing artifact on this non tailored examination, but suspect it has significantly increased in size, and now involves the right atrium in addition to the right ventricle and pulmonary outflow tract, measuring approximately 6.9 x 4.9 cm, previously 4.5 x 4.2 cm when measured similarly. This may reflect a combination of metastatic soft tissue and thrombus, and could be more clearly assessed by echocardiography and cardiac MRI. 2. No significant change of a right upper lobe pulmonary nodule, measuring 0.8 cm, which remains modestly suspicious for a metastasis. Other small nodules are likewise stable. Attention on follow-up. 3. Status post laryngectomy and tracheostomy. 4. Coronary artery disease. Aortic Atherosclerosis (ICD10-I70.0). Electronically Signed   By: Delanna Ahmadi M.D.   On: 07/12/2021 11:00   ECHOCARDIOGRAM COMPLETE  Result Date: 07/24/2021    ECHOCARDIOGRAM REPORT   Patient Name:   [name] [name] Date of Exam: 07/24/2021 Medical Rec #:  [id number]       Height:       62.0 in Accession #:    [id number]      Weight:       144.4 lb Date of Birth:  03-08-1951        BSA:          1.665 m Patient Age:    70 years        BP:           117/77 mmHg Patient Gender: F               HR:           76 bpm. Exam Location:  Outpatient Procedure: 2D Echo, Cardiac Doppler, Color Doppler and Strain Analysis Indications:    Chemo Z09                 mass/cancer/thrombus noted, on anticoagulation tx  History:  Patient has prior history of Echocardiogram examinations, most                 recent  05/03/2021.  Sonographer:    Bernadene Person RDCS Referring Phys: [id number] Zakaria Sedor Ledbetter  1. There is an echogenic RV and RVOT mass. Attachment appears to be on the right ventricular mid wall. Mass is 32 by 24 mm, though may be underestimated. Right ventricular systolic function is normal. The right ventricular size is normal. There is normal pulmonary artery systolic pressure.  2. Left ventricular ejection fraction, by estimation, is 60 to 65%. The left ventricle has normal function. The left ventricle has no regional wall motion abnormalities. Left ventricular diastolic parameters are consistent with Grade I diastolic dysfunction (impaired relaxation). The average left ventricular global longitudinal strain is -19.0 %. The global longitudinal strain is normal.  3. The mitral valve is normal in structure. No evidence of mitral valve regurgitation.  4. The aortic valve is tricuspid. Aortic valve regurgitation is not visualized. No aortic stenosis is present. Comparison(s): A prior study was performed on 05/03/21. Prior images reviewed side by side. Similar size of RV mass to slight increase. Do not see right atrial involvement in this study. Conclusion(s)/Recommendation(s): If concerned for tumor vs thombus, cardiac MRI could be considered. FINDINGS  Left Ventricle: Left ventricular ejection fraction, by estimation, is 60 to 65%. The left ventricle has normal function. The left ventricle has no regional wall motion abnormalities. The average left ventricular global longitudinal strain is -19.0 %. The global longitudinal strain is normal. The left ventricular internal cavity size was normal in size. There is no left ventricular hypertrophy. Left ventricular diastolic parameters are consistent with Grade I diastolic dysfunction (impaired relaxation). Right Ventricle: There is an echogenic RV and RVOT mass. Attachment appears to be on the right ventricular mid wall. Mass is 32 by 24 mm, though may be  underestimated. The right ventricular size is normal. No increase in right ventricular wall thickness.  Right ventricular systolic function is normal. There is normal pulmonary artery systolic pressure. The tricuspid regurgitant velocity is 2.05 m/s, and with an assumed right atrial pressure of 3 mmHg, the estimated right ventricular systolic pressure is 19.3 mmHg. Left Atrium: Left atrial size was normal in size. Right Atrium: Right atrial size was normal in size. Pericardium: There is no evidence of pericardial effusion. Mitral Valve: The mitral valve is normal in structure. No evidence of mitral valve regurgitation. Tricuspid Valve: The tricuspid valve is normal in structure. Tricuspid valve regurgitation is not demonstrated. Aortic Valve: The aortic valve is tricuspid. Aortic valve regurgitation is not visualized. No aortic stenosis is present. Pulmonic Valve: The pulmonic valve was not well visualized. Pulmonic valve regurgitation is not visualized. No evidence of pulmonic stenosis. Aorta: The aortic root and ascending aorta are structurally normal, with no evidence of dilitation. IAS/Shunts: The atrial septum is grossly normal.  LEFT VENTRICLE PLAX 2D LVIDd:         4.20 cm   Diastology LVIDs:         2.90 cm   LV e' medial:    7.88 cm/s LV PW:         1.00 cm   LV E/e' medial:  6.1 LV IVS:        1.00 cm   LV e' lateral:   9.00 cm/s LVOT diam:     1.90 cm   LV E/e' lateral: 5.3 LV SV:         44  LV SV Index:   27        2D Longitudinal Strain LVOT Area:     2.84 cm  2D Strain GLS Avg:     -19.0 %  RIGHT VENTRICLE RV S prime:     6.45 cm/s TAPSE (M-mode): 1.2 cm LEFT ATRIUM             Index        RIGHT ATRIUM           Index LA diam:        2.80 cm 1.68 cm/m   RA Area:     12.60 cm LA Vol (A2C):   29.2 ml 17.54 ml/m  RA Volume:   26.90 ml  16.16 ml/m LA Vol (A4C):   20.5 ml 12.32 ml/m LA Biplane Vol: 24.8 ml 14.90 ml/m  AORTIC VALVE LVOT Vmax:   78.40 cm/s LVOT Vmean:  51.000 cm/s LVOT VTI:    0.156 m   AORTA Ao Root diam: 3.20 cm Ao Asc diam:  3.50 cm MITRAL VALVE               TRICUSPID VALVE MV Area (PHT): 3.17 cm    TR Peak grad:   16.8 mmHg MV Decel Time: 239 msec    TR Vmax:        205.00 cm/s MV E velocity: 48.00 cm/s MV A velocity: 65.80 cm/s  SHUNTS MV E/A ratio:  0.73        Systemic VTI:  0.16 m                            Systemic Diam: 1.90 cm Rudean Haskell MD Electronically signed by Rudean Haskell MD Signature Date/Time: 07/24/2021/2:11:58 PM    Final

## 2021-07-31 ENCOUNTER — Telehealth (HOSPITAL_COMMUNITY): Payer: Self-pay | Admitting: Emergency Medicine

## 2021-07-31 DIAGNOSIS — R633 Feeding difficulties, unspecified: Secondary | ICD-10-CM | POA: Diagnosis not present

## 2021-07-31 DIAGNOSIS — C321 Malignant neoplasm of supraglottis: Secondary | ICD-10-CM | POA: Diagnosis not present

## 2021-07-31 NOTE — Telephone Encounter (Signed)
Reaching out to patient to offer assistance regarding upcoming cardiac imaging study; pt verbalizes understanding of appt date/time, parking situation and where to check in, and verified current allergies; name and call back number provided for further questions should they arise Marchia Bond RN [facility] [phone] office [phone] cell   Pt is nonverbal due to throat cancer, spoke with patients partner cindy.  Has port-a-cath (will place emla cream) Denies claustro Denies metal implants

## 2021-08-01 ENCOUNTER — Other Ambulatory Visit: Payer: Self-pay

## 2021-08-01 ENCOUNTER — Ambulatory Visit (HOSPITAL_COMMUNITY)
Admission: RE | Admit: 2021-08-01 | Discharge: 2021-08-01 | Disposition: A | Discharge status: Home / Self Care | Payer: Medicare HMO | Source: Ambulatory Visit | Attending: Hematology and Oncology | Admitting: Hematology and Oncology

## 2021-08-01 DIAGNOSIS — I5189 Other ill-defined heart diseases: Secondary | ICD-10-CM | POA: Insufficient documentation

## 2021-08-01 IMAGING — MR MR CARD MORPHOLOGY WO/W CM
45 of 48 series · 45 of 48 positions shown · IV contrast (gadavist)
Comparison: none

CLINICAL DATA: 70F with laryngeal squamous cell carcinoma with lung
mets. Evaluate RV mass

EXAM:
CARDIAC MRI
TECHNIQUE: The patient was scanned on a 1.5 Tesla Siemens magnet. A dedicated
cardiac coil was used. Functional imaging was done using [employer]
sequences. [DATE], and 4 chamber views were done to assess for RWMA's.
Modified JUMPER rule using a short axis stack was used to
calculate an ejection fraction on a dedicated work station using
[employer] software. The patient received 7 cc of Gadavist. After 10
minutes inversion recovery sequences were used to assess for
infiltration and scar tissue.
CONTRAST:  7 cc  of Gadavist

[Series 4: t2_haste_db_tra_bh · axial · 8.0mm · 1.41mm/px · 1 of 16 slices shown]
[im 1/16]
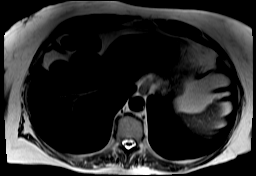

[Series 9: bSSFP · oblique · 8.0mm · 1.70mm/px · 1 of 25 slices shown (1 of 31)]
[im 1/25]
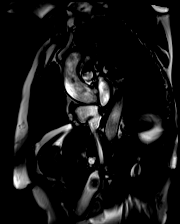

[Series 10: bSSFP · oblique · 8.0mm · 1.70mm/px · 1 of 25 slices shown (2 of 31)]
[im 1/25]
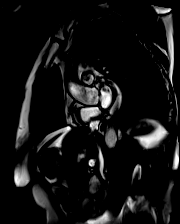

[Series 11: bSSFP · oblique · 8.0mm · 1.70mm/px · 1 of 25 slices shown (3 of 31)]
[im 1/25]
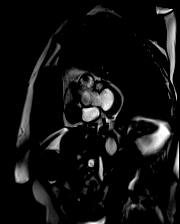

[Series 12: bSSFP · oblique · 8.0mm · 1.70mm/px · 1 of 25 slices shown (4 of 31)]
[im 1/25]
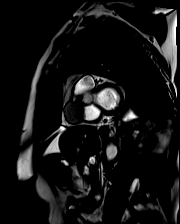

[Series 13: bSSFP · oblique · 8.0mm · 1.70mm/px · 1 of 25 slices shown (5 of 31)]
[im 1/25]
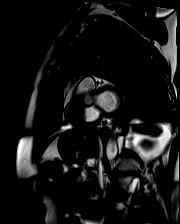

[Series 14: bSSFP · oblique · 8.0mm · 1.70mm/px · 1 of 25 slices shown (6 of 31)]
[im 1/25]
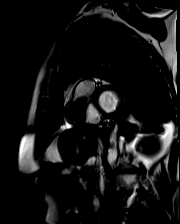

[Series 15: bSSFP · oblique · 8.0mm · 1.70mm/px · 1 of 25 slices shown (7 of 31)]
[im 1/25]
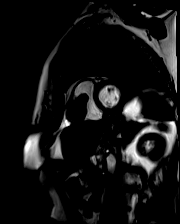

[Series 16: bSSFP · oblique · 8.0mm · 1.70mm/px · 1 of 25 slices shown (8 of 31)]
[im 1/25]
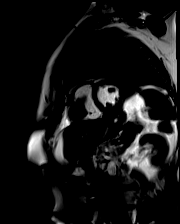

[Series 17: bSSFP · oblique · 8.0mm · 1.70mm/px · 1 of 25 slices shown (9 of 31)]
[im 1/25]
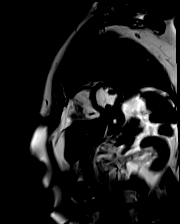

[Series 18: bSSFP · oblique · 8.0mm · 1.70mm/px · 1 of 25 slices shown (10 of 31)]
[im 1/25]
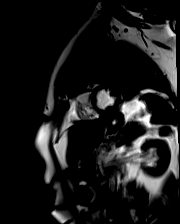

[Series 19: bSSFP · oblique · 8.0mm · 1.70mm/px · 1 of 25 slices shown (11 of 31)]
[im 1/25]
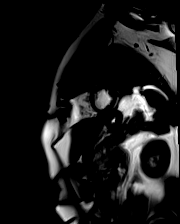

[Series 20: bSSFP · oblique · 8.0mm · 1.70mm/px · 1 of 25 slices shown (12 of 31)]
[im 1/25]
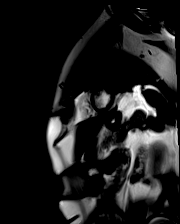

[Series 21: bSSFP · oblique · 8.0mm · 1.70mm/px · 1 of 25 slices shown (13 of 31)]
[im 1/25]
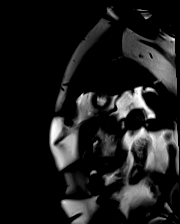

[Series 22: bSSFP · oblique · 8.0mm · 1.70mm/px · 1 of 25 slices shown (14 of 31)]
[im 1/25]
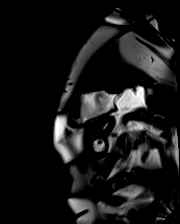

[Series 23: bSSFP · oblique · 8.0mm · 1.70mm/px · 1 of 25 slices shown (15 of 31)]
[im 1/25]
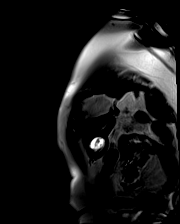

[Series 24: bSSFP · oblique · 8.0mm · 1.70mm/px · 1 of 25 slices shown (16 of 31)]
[im 1/25]
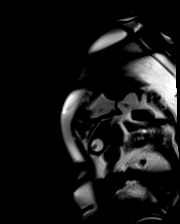

[Series 25: bSSFP · axial · 6.0mm · 1.70mm/px · 1 of 25 slices shown (17 of 31)]
[im 1/25]
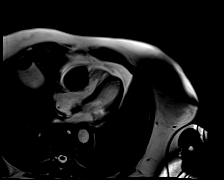

[Series 25: bSSFP · axial · 6.0mm · 1.70mm/px · 1 of 25 slices shown (18 of 31)]
[im 1/25]
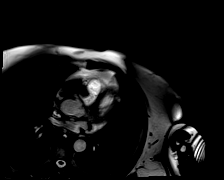

[Series 25: bSSFP · axial · 6.0mm · 1.70mm/px · 1 of 25 slices shown (19 of 31)]
[im 1/25]
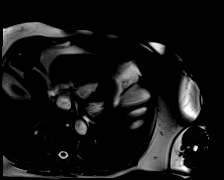

[Series 25: bSSFP · axial · 6.0mm · 1.70mm/px · 1 of 25 slices shown (20 of 31)]
[im 1/25]
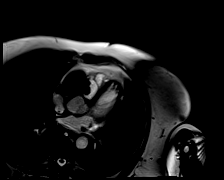

[Series 25: bSSFP · axial · 6.0mm · 1.70mm/px · 1 of 25 slices shown (21 of 31)]
[im 1/25]
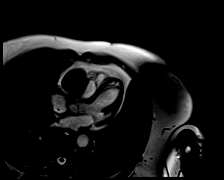

[Series 25: bSSFP · axial · 6.0mm · 1.70mm/px · 1 of 25 slices shown (22 of 31)]
[im 1/25]
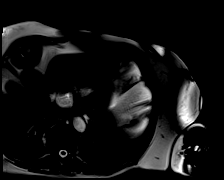

[Series 25: bSSFP · axial · 6.0mm · 1.70mm/px · 1 of 25 slices shown (23 of 31)]
[im 1/25]
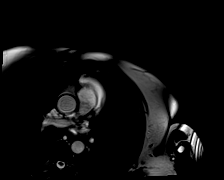

[Series 25: bSSFP · axial · 6.0mm · 1.70mm/px · 1 of 25 slices shown (24 of 31)]
[im 1/25]
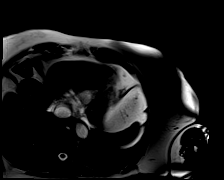

[Series 25: bSSFP · axial · 6.0mm · 1.70mm/px · 1 of 25 slices shown (25 of 31)]
[im 1/25]
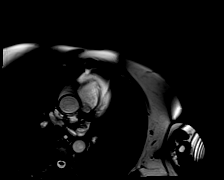

[Series 25: bSSFP · axial · 6.0mm · 1.70mm/px · 1 of 25 slices shown (26 of 31)]
[im 1/25]
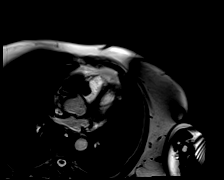

[Series 25: bSSFP · axial · 6.0mm · 1.70mm/px · 1 of 25 slices shown (27 of 31)]
[im 1/25]
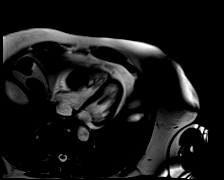

[Series 25: bSSFP · axial · 6.0mm · 1.70mm/px · 1 of 25 slices shown (28 of 31)]
[im 1/25]
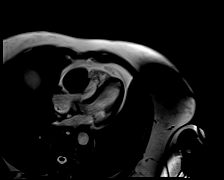

[Series 26: bSSFP · axial · 6.0mm · 1.41mm/px · 1 of 25 slices shown (29 of 31)]
[im 1/25]
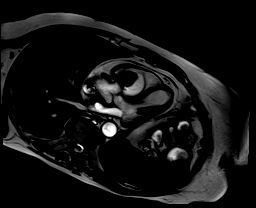

[Series 27: bSSFP · oblique · 6.0mm · 1.48mm/px · 1 of 25 slices shown (30 of 31)]
[im 1/25]
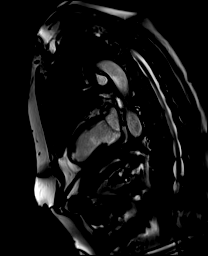

[Series 28: bSSFP · axial · 6.0mm · 1.48mm/px · 1 of 25 slices shown (31 of 31)]
[im 1/25]
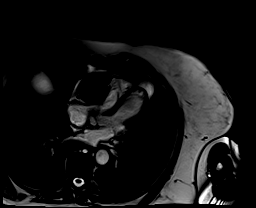

[Series 29: STIR · axial · 8.0mm · 1.92mm/px · 1 of 1 slices shown]
[im 1/1]
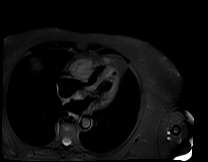

[Series 30: t1_tse_db short axis · axial · 8.0mm · 1.40mm/px · 1 of 1 slices shown]
[im 1/1]
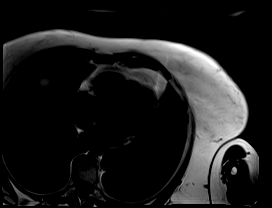

[Series 31: t1_tse_fs db short · axial · 8.0mm · 1.40mm/px · 1 of 1 slices shown]
[im 1/1]
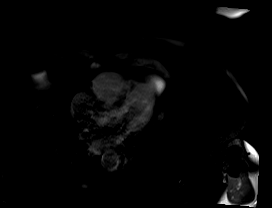

[Series 32: t2_tse_ db short · axial · 8.0mm · 1.50mm/px · 1 of 1 slices shown]
[im 1/1]
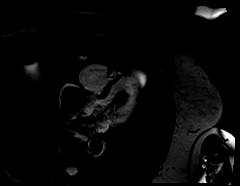

[Series 37: cine_trufi_cs_rt_short axis · axial · 6.0mm · 1.73mm/px · 1 of 48 slices shown (1 of 9)]
[im 1/48]
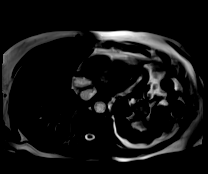

[Series 37: cine_trufi_cs_rt_short axis · axial · 6.0mm · 1.73mm/px · 1 of 48 slices shown (2 of 9)]
[im 1/48]
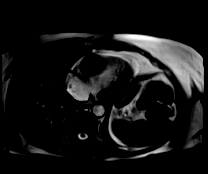

[Series 37: cine_trufi_cs_rt_short axis · axial · 6.0mm · 1.73mm/px · 1 of 48 slices shown (3 of 9)]
[im 1/48]
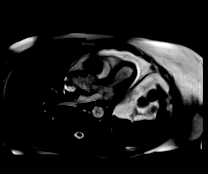

[Series 37: cine_trufi_cs_rt_short axis · axial · 6.0mm · 1.73mm/px · 1 of 48 slices shown (4 of 9)]
[im 1/48]
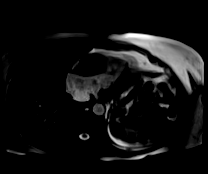

[Series 37: cine_trufi_cs_rt_short axis · axial · 6.0mm · 1.73mm/px · 1 of 48 slices shown (5 of 9)]
[im 1/48]
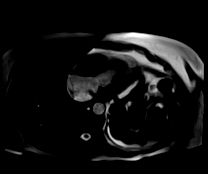

[Series 37: cine_trufi_cs_rt_short axis · axial · 6.0mm · 1.73mm/px · 1 of 48 slices shown (6 of 9)]
[im 1/48]
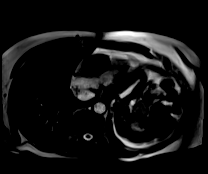

[Series 37: cine_trufi_cs_rt_short axis · axial · 6.0mm · 1.73mm/px · 1 of 48 slices shown (7 of 9)]
[im 1/48]
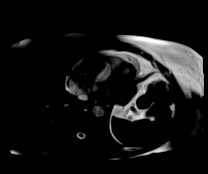

[Series 37: cine_trufi_cs_rt_short axis · axial · 6.0mm · 1.73mm/px · 1 of 48 slices shown (8 of 9)]
[im 1/48]
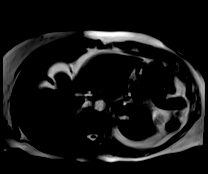

[Series 37: cine_trufi_cs_rt_short axis · axial · 6.0mm · 1.73mm/px · 1 of 48 slices shown (9 of 9)]
[im 1/48]
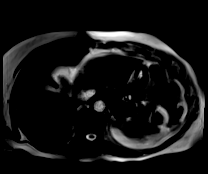

[45 of 48 positions shown; findings below may reference images not displayed]

FINDINGS: Left ventricle:

-Normal size

-Normal systolic function

-Mild ECV elevation (29%)

-No LGE

LV EF:  66% (Normal 56-78%)

Absolute volumes:

LV EDV: 71mL (Normal 52-141 mL)

LV ESV: 24mL (Normal 13-51 mL)

LV SV: 47mL (Normal 33-97 mL)

CO: 3.6L/min (Normal 2.7-6.0 L/min)

Indexed volumes:

LV EDV: 42mL/sq-m (Normal 41-81 mL/sq-m)

LV ESV: 14mL/sq-m (Normal 12-21 mL/sq-m)

LV SV: 28mL/sq-m (Normal 26-56 mL/sq-m)

CI: 2.1L/min/sq-m (Normal 1.8-3.8 L/min/sq-m)

Right ventricle:

-Large RV mass measuring 52mm x 48mm x 41mm. Mass is isointense on
T1 weighted imaging, does not suppress with fat saturation,
isointense on T2 weighted imaging, perfuses on first pass perfusion,
and heterogenous enhancement on LGE imaging.

-Normal size

-Mild systolic dysfunction

RV EF: 41% (Normal 47-80%)

Absolute volumes:

RV EDV: 125mL (Normal 58-154 mL)

RV ESV: 74mL (Normal 12-68 mL)

RV SV: 51mL (Normal 35-98 mL)

CO: 3.9L/min (Normal 2.7-6 L/min)

Indexed volumes:

RV EDV: 74mL/sq-m (Normal 48-87 mL/sq-m)

RV ESV: 44mL/sq-m (Normal 11-28 mL/sq-m)

RV SV: 30mL/sq-m (Normal 27-57 mL/sq-m)

CI: 2.3L/min/sq-m (Normal 1.8-3.8 L/min/sq-m)

Left atrium: Normal size

Right atrium: Normal size

Mitral valve: No regurgitation

Aortic valve: No regurgitation

Tricuspid valve: No regurgitation

Pulmonic valve: No regurgitation

Aorta: Normal proximal ascending aorta

Pericardium: Normal
IMPRESSION: 1. Large RV mass measuring 52mm x 48mm x 41mm. Mass is isointense to
myocardium on T1 weighted imaging, does not suppress with fat
saturation, isointense on T2 weighted imaging, perfuses on first
pass perfusion, and heterogenous enhancement on LGE imaging. This is
consistent with tumor, and in setting of known metastatic laryngeal
cancer, is consistent with metastasis. There does appear to be
thrombus adherent to tumor

2. Normal LV size and systolic function (EF 66%). No late gadolinium
enhancement to suggest myocardial scar

3.  Normal RV size with mild systolic dysfunction (EF 41%)

## 2021-08-01 MED ORDER — HEPARIN SOD (PORK) LOCK FLUSH 100 UNIT/ML IV SOLN
500.0000 [IU] | INTRAVENOUS | Status: AC | PRN
  Administered 2021-08-01: 500 [IU]
  Filled 2021-08-01: qty 5

## 2021-08-01 MED ORDER — GADOBUTROL 1 MMOL/ML IV SOLN
7.0000 mL | Freq: Once | INTRAVENOUS | Status: AC | PRN
  Administered 2021-08-01: 7 mL via INTRAVENOUS

## 2021-08-02 ENCOUNTER — Ambulatory Visit: Payer: Medicare HMO

## 2021-08-02 ENCOUNTER — Other Ambulatory Visit: Payer: Medicare HMO

## 2021-08-02 ENCOUNTER — Ambulatory Visit: Payer: Medicare HMO | Admitting: Hematology and Oncology

## 2021-08-03 ENCOUNTER — Inpatient Hospital Stay: Payer: Medicare HMO

## 2021-08-03 ENCOUNTER — Encounter: Payer: Self-pay | Admitting: Hematology and Oncology

## 2021-08-03 ENCOUNTER — Inpatient Hospital Stay (HOSPITAL_BASED_OUTPATIENT_CLINIC_OR_DEPARTMENT_OTHER): Payer: Medicare HMO | Admitting: Hematology and Oncology

## 2021-08-03 ENCOUNTER — Other Ambulatory Visit: Payer: Self-pay

## 2021-08-03 VITALS — BP 119/79 | HR 89 | Temp 97.4°F | Resp 18 | Ht 62.0 in | Wt 144.4 lb

## 2021-08-03 DIAGNOSIS — E039 Hypothyroidism, unspecified: Secondary | ICD-10-CM

## 2021-08-03 DIAGNOSIS — Z5111 Encounter for antineoplastic chemotherapy: Secondary | ICD-10-CM | POA: Diagnosis not present

## 2021-08-03 DIAGNOSIS — Z7901 Long term (current) use of anticoagulants: Secondary | ICD-10-CM | POA: Diagnosis not present

## 2021-08-03 DIAGNOSIS — Z95828 Presence of other vascular implants and grafts: Secondary | ICD-10-CM

## 2021-08-03 DIAGNOSIS — Z79899 Other long term (current) drug therapy: Secondary | ICD-10-CM | POA: Diagnosis not present

## 2021-08-03 DIAGNOSIS — C329 Malignant neoplasm of larynx, unspecified: Secondary | ICD-10-CM

## 2021-08-03 DIAGNOSIS — Z5112 Encounter for antineoplastic immunotherapy: Secondary | ICD-10-CM | POA: Diagnosis not present

## 2021-08-03 DIAGNOSIS — C321 Malignant neoplasm of supraglottis: Secondary | ICD-10-CM | POA: Diagnosis not present

## 2021-08-03 DIAGNOSIS — C7989 Secondary malignant neoplasm of other specified sites: Secondary | ICD-10-CM | POA: Diagnosis not present

## 2021-08-03 LAB — COMPREHENSIVE METABOLIC PANEL
ALT: 14 U/L (ref 0–44)
AST: 19 U/L (ref 15–41)
Albumin: 4 g/dL (ref 3.5–5.0)
Alkaline Phosphatase: 66 U/L (ref 38–126)
Anion gap: 13 (ref 5–15)
BUN: 22 mg/dL (ref 8–23)
CO2: 23 mmol/L (ref 22–32)
Calcium: 8.9 mg/dL (ref 8.9–10.3)
Chloride: 104 mmol/L (ref 98–111)
Creatinine, Ser: 0.78 mg/dL (ref 0.44–1.00)
GFR, Estimated: >60 mL/min (ref >60)
Glucose, Bld: 99 mg/dL (ref 70–99)
Potassium: 4.2 mmol/L (ref 3.5–5.1)
Sodium: 140 mmol/L (ref 135–145)
Total Bilirubin: 0.5 mg/dL (ref 0.3–1.2)
Total Protein: 7.4 g/dL (ref 6.5–8.1)

## 2021-08-03 LAB — CBC WITH DIFFERENTIAL/PLATELET
Abs Immature Granulocytes: 0.01 10*3/uL (ref 0.00–0.07)
Basophils Absolute: 0 10*3/uL (ref 0.0–0.1)
Basophils Relative: 0 %
Eosinophils Absolute: 0.4 10*3/uL (ref 0.0–0.5)
Eosinophils Relative: 7 %
HCT: 40.4 % (ref 36.0–46.0)
Hemoglobin: 13.3 g/dL (ref 12.0–15.0)
Immature Granulocytes: 0 %
Lymphocytes Relative: 25 %
Lymphs Abs: 1.2 10*3/uL (ref 0.7–4.0)
MCH: 28.7 pg (ref 26.0–34.0)
MCHC: 32.9 g/dL (ref 30.0–36.0)
MCV: 87.1 fL (ref 80.0–100.0)
Monocytes Absolute: 0.5 10*3/uL (ref 0.1–1.0)
Monocytes Relative: 11 %
Neutro Abs: 2.6 10*3/uL (ref 1.7–7.7)
Neutrophils Relative %: 57 %
Platelets: 217 10*3/uL (ref 150–400)
RBC: 4.64 MIL/uL (ref 3.87–5.11)
RDW: 13.3 % (ref 11.5–15.5)
WBC: 4.7 10*3/uL (ref 4.0–10.5)
nRBC: 0 % (ref 0.0–0.2)

## 2021-08-03 LAB — TSH: TSH: 1.73 u[IU]/mL (ref 0.308–3.960)

## 2021-08-03 MED ORDER — HEPARIN SOD (PORK) LOCK FLUSH 100 UNIT/ML IV SOLN
500.0000 [IU] | Freq: Once | INTRAVENOUS | Status: AC
  Administered 2021-08-03: 500 [IU]

## 2021-08-03 MED ORDER — SODIUM CHLORIDE 0.9% FLUSH
10.0000 mL | Freq: Once | INTRAVENOUS | Status: AC
  Administered 2021-08-03: 10 mL

## 2021-08-03 NOTE — Progress Notes (Signed)
Per Dr. Alvy Bimler - patient will not be receiving treatment today. Infusion made aware. Appointment canceled and port deaccessed.

## 2021-08-03 NOTE — Progress Notes (Signed)
[facility] OFFICE PROGRESS NOTE  Patient Care Team: Hamrick, Lorin Mercy, MD as PCP - General (Family Medicine) Heath Lark, MD as Consulting Physician (Hematology and Oncology)  ASSESSMENT & PLAN:  Laryngeal squamous cell carcinoma ([place]) I have reviewed numerous imaging studies with the patient and her son including CT imaging, PET/CT imaging, echocardiogram and MRI of the heart Overall, the MRI of the heart revealed presence of metastatic cancer in her heart along with thrombus She is not responding to single agent pembrolizumab anymore We reviewed guidelines We discussed the risk and benefits of single agent cisplatin We discussed treatment with 2 weeks on 1 week off approach to allow blood count recovery I recommend minimum 3 months of treatment before repeating MRI of the heart for evaluation of response rate and she is in agreement In the meantime, she will continue anticoagulation therapy indefinitely  Orders Placed This Encounter  Procedures   CBC with Differential ([facility] Only)    Standing Status:   Standing    Number of Occurrences:   20    Standing Expiration Date:   71/02/2693   Basic Metabolic Panel - [facility] Only    Standing Status:   Standing    Number of Occurrences:   20    Standing Expiration Date:   08/03/2022   Magnesium    Standing Status:   Standing    Number of Occurrences:   20    Standing Expiration Date:   08/03/2022    All questions were answered. The patient knows to call the clinic with any problems, questions or concerns. The total time spent in the appointment was 40 minutes encounter with patients including review of chart and various tests results, discussions about plan of care and coordination of care plan   Heath Lark, MD 08/03/2021 4:56 PM  INTERVAL HISTORY: Please see below for problem oriented charting. she returns for treatment follow-up with her son She has no recent symptoms of bleeding, chest pain or  shortness of breath  REVIEW OF SYSTEMS:   Constitutional: Denies fevers, chills or abnormal weight loss Eyes: Denies blurriness of vision Ears, nose, mouth, throat, and face: Denies mucositis or sore throat Respiratory: Denies cough, dyspnea or wheezes Cardiovascular: Denies palpitation, chest discomfort or lower extremity swelling Gastrointestinal:  Denies nausea, heartburn or change in bowel habits Skin: Denies abnormal skin rashes Lymphatics: Denies new lymphadenopathy or easy bruising Neurological:Denies numbness, tingling or new weaknesses Behavioral/Psych: Mood is stable, no new changes  All other systems were reviewed with the patient and are negative.  I have reviewed the past medical history, past surgical history, social history and family history with the patient and they are unchanged from previous note.  ALLERGIES:  has No Known Allergies.  MEDICATIONS:  Current Outpatient Medications  Medication Sig Dispense Refill   apixaban (ELIQUIS) 2.5 MG TABS tablet Take 1 tablet (2.5 mg total) by mouth 2 (two) times daily. 60 tablet 3   chlorhexidine (PERIDEX) 0.12 % solution Use as directed 15 mLs in the mouth or throat 2 (two) times daily. Rinse with 15 mls twice daily for 30 seconds. Use after breakfast and at bedtime. Spit out excess. Do not swallow. 473 mL 2   Cholecalciferol (VITAMIN D3) 125 MCG (5000 UT) CAPS Take 5,000 Units by mouth daily.     HYDROcodone-acetaminophen (NORCO/VICODIN) 5-325 MG tablet TAKE 1 TABLET BY MOUTH EVERY 6 HOURS AS NEEDED FOR MODERATE PAIN 60 tablet 0   levothyroxine (SYNTHROID) 137 MCG tablet Take 137  mcg by mouth daily before breakfast.     lidocaine-prilocaine (EMLA) cream Apply topically daily as needed. 30 g 9   Nutritional Supplements (KATE FARMS PEPTIDE 1.5) LIQD Take 4 Bottles by mouth daily. 325 mL 11   nystatin (MYCOSTATIN) 100000 UNIT/ML suspension Take 5 mLs by mouth 2 (two) times daily.     oxymetazoline (AFRIN) 0.05 % nasal spray Place  3 sprays into both nostrils 2 (two) times daily as needed (Bleeding from stoma). 30 mL 0   No current facility-administered medications for this visit.    SUMMARY OF ONCOLOGIC HISTORY: Oncology History  Laryngeal squamous cell carcinoma ([place])  12/02/2018 Imaging   CT neck: IMPRESSION: 1. Supraglottic laryngeal carcinoma extending across the anterior commissure and invading the prelaryngeal fat, left aryepiglottic fold and left aspect of the epiglottis. 2. Severe narrowing of the laryngeal airway. 3. Bilateral subcentimeter level 2A and 2A cervical lymph nodes.     12/02/2018 Imaging   CT  Chest:  IMPRESSION: No evidence of metastatic disease in the chest.   12/03/2018 Pathology Results   (Care Everywhere; [facility]) A: Larynx, supraglottis, biopsy - Atypical squamous proliferation with at least low grade dysplasia and abundant keratinization - No definite high grade dysplasia or invasive carcinoma represented (see comment)   12/18/2018 Pathology Results   (Care Everywhere; [facility]) A:  Neck, left, fine needle aspiration - Rare single atypical cells, no cells diagnostic of malignancy - Lymph node material present  B:  Neck, right, fine needle aspiration - Non-diagnostic specimen - No lymph node material identified - Mixed inflammation and blood   12/31/2018 Pathology Results   (Care Everywhere; [facility])  A: Larynx, left supraglottic mass, biopsy - Atypical squamous proliferation with abundant keratinization, suspicious for well-differentiated invasive squamous cell carcinoma  B: Larynx, left supraglottic mass, biopsy - Well-differentiated invasive squamous cell carcinoma with abundant keratinization (see comment)   01/18/2019 Initial Diagnosis   Laryngeal cancer ([place])   01/26/2019 Imaging   1. Locally advanced hypermetabolic laryngeal neoplasm centered in the left supraglottic space with bilateral glottic and epiglottic involvement and subglottic extension on the  left. 2. Hypermetabolic bilateral level 3 neck nodal metastases. 3. Hypermetabolic high mediastinal nodal metastases between the trachea and upper thoracic esophagus. 4. Otherwise no distant hypermetabolic metastatic disease. 5. Nodular 7 mm focus at the left lung base, below PET resolution, recommend attention on follow-up chest CT in 3 months. 6. Chronic findings include: Aortic Atherosclerosis (ICD10-I70.0). Cholelithiasis.   02/08/2019 Cancer Staging   Staging form: Larynx - Supraglottis, AJCC 8th Edition - Clinical stage from 02/08/2019: Stage IVA (cT3, cN2c, cM0) - Signed by Eppie Gibson, MD on 02/08/2019    02/11/2019 - 04/07/2019 Chemotherapy   The patient weekly cisplatin for chemotherapy treatment.     07/20/2019 Pathology Results   PROCEDURE: Total glossectomy, total pharyngectomy, total laryngectomy, total thyroidectomy, and cervical esophagectomy TUMOR SITE: Larynx TUMOR LATERALITY: Not specified TUMOR FOCALITY: Unifocal TUMOR SIZE:    GREATEST DIMENSION: 6.9 cm    ADDITIONAL DIMENSIONS: 5.4 x 2.4 cm HISTOLOGIC TYPE: Squamous cell carcinoma, conventional (keratinizing) HISTOLOGIC GRADE:  G1-G2: Well to moderately differentiated TUMOR EXTENSION: Tumor involves the posterior tongue, pharynx, larynx, tracheal cartilage, hyoid bone, cervical esophagus, vagus nerve, carotid artery, deep cervical fascia, ans surrounding skeletal muscle and soft tissue MARGINS: Uninvolved by invasive tumor; Perineural invasion is less than 39mm from the left soft tissue margin LYMPHOVASCULAR INVASION: Not identified PERINEURAL INVASION:  Present REGIONAL LYMPH NODES:     NUMBER OF LYMPH NODES INVOLVED:  2     NUMBER OF LYMPH NODES EXAMINED: 24     LATERALITY OF LYMPH NODES INVOLVED: Cannot determine     SIZE OF LARGEST METASTATIC DEPOSIT: 0.8 cm     EXTRANODAL EXTENSION: Not identified PATHOLOGIC STAGE CLASSIFICATION (pTNM, AJCC 8TH Ed): (r)(y) pT4b pN2a DISTANT METASTASIS  (pM): ADDITIONAL PATHOLOGIC FINDINGS: Previous chemoradiation   11/04/2019 Imaging   PET: 1.  Hypermetabolic bilateral neck lesions as detailed above are concerning for disease recurrence. 2.  Compared to 07/08/2019 PET scan, interval development of subcentimeter bilateral upper lobe pulmonary nodules, size below PET resolution. These are concerning for metastasis. Consider diagnostic quality CT chest exam for further evaluation as per oncology treatment planning. 3.  Focal hypermetabolic uptake in right ventricular cavity of indeterminate significance. Recommend echocardiographic examination to rule out underlying thrombus. This finding was discussed with Dr. Conley Canal by Dr. Clementeen Graham and Dr. Marcello Moores on 11/04/2019 at approximately 3:00 PM via telephone. 4.  Ancillary CT findings as above.   11/05/2019 Imaging   Echocardiogram: Summary   1. The left ventricle is normal in size with normal wall thickness.   2. The left ventricular systolic function is normal, LVEF is visually estimated at 60-65%.   3. The right ventricle is normal in size, with normal systolic function.   4. There is an extrinsic compression of the RV and RA laterally at the AV groove by a mass of 4.3 cm diameter. Unclear if the mass is invasive, although no pericardial effusion seen suggestive no invasion possibly. There is no evidence of hemodynamic compromise secondary to cardiac compression with normal IVC diameter.   12/06/2019 Pathology Results   A. LYMPH NODE, LEFT SUBMANDIBULAR, BIOPSY:  - Squamous cell carcinoma.  - No distinct nodal tissue identified.    12/06/2019 Procedure   Successful ultrasound-guided core biopsy of centrally necrotic left submandibular lymph node. Of note, due to extensive prior postsurgical and post radiation changes, patient has very limited mobility in the neck and the biopsy was extremely challenging    12/16/2019 -  Chemotherapy   The patient had pembrolizumab for chemotherapy treatment.      03/08/2020 PET scan   1. Diminished size of masses in the neck as described. Persistent increased metabolic activity particularly in the LEFT neck. Some of this diminished size on the LEFT could be due to decompression of necrotic material. Correlate with any symptoms of inflammation or infection in this area as well given the paucity of residual soft tissue associated with the above abnormality, overall findings are compatible with residual disease in this location and there is no significant surrounding stranding to indicate inflammation that would correlate with the degree of FDG uptake remaining. 2. Diminished activity in the juxta cardiac mass with peripheral activity suggesting interval necrosis, on the prior study this was uniformly hypermetabolic. There is still considerable FDG uptake. 3. No signs of disease in the abdomen or pelvis. 4. 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 Correlate with any new pain or symptoms in this area with further imaging as warranted. No discrete mass or bone destruction on today's study.   06/08/2020 Imaging   CT neck 1. Status post laryngectomy and neck dissection without lymphadenopathy or mass lesion. 2. Short segment severe stenosis of the left internal carotid artery adjacent to a surgical clip.     06/08/2020 Imaging   CT chest 1. There is a large, hypodense lesion within, or very closely abutting and likely intruding upon the right ventricle, as seen on prior examination and approximately  4.6 x 4.3 cm, poorly evaluated due to cardiac motion. Findings are consistent with metastatic lesion and/or thrombus and poorly assessed by ungated CT. Consider echocardiography and cardiac MRI for further assessment.   2. Multiple small pulmonary nodules in the bilateral lung apices are unchanged. Attention on follow-up.   3.  No evidence of new metastatic disease in the chest.   4.  Aortic Atherosclerosis (ICD10-I70.0).     09/07/2020 Imaging   1. Mild  decrease in size of juxta cardiac metastatic involving the right ventricle. 2. Small pulmonary nodules are unchanged from previous exam. 3. Aortic atherosclerosis. 4. Gallstone.     12/14/2020 Procedure   Successful exchange and up size from 20 French pull-through gastrostomy to 22 French balloon retention gastrostomy.   03/30/2021 Imaging   Ct neck 1.  In comparison with PET/CT from February 2021, marked interval decrease in size of soft tissue mass along the angle of the left mandible consistent with a positive treatment response. However, there are new destructive bony changes along the lingual surface of the mandible in this region with appearance of soft tissue infiltrating the marrow cavity. While concerning for neoplastic involvement, changes could also represent other nonspecific osteonecrosis including infection or sequela of prior radiation treatment. Recommend correlation with direct visual inspection. Further evaluation with MRI may be of value.  2.  Centrally necrotic 1.3 cm right level IIa lymph node corresponding with previously described hypermetabolic lesion, decreased in size from prior. No suspicious new or enlarged lymph nodes in the neck.  3.  Enlarged right lower paratracheal lymph node suspected in the mediastinum, incompletely imaged. Consider dedicated CT imaging of the chest.  4.  Scattered pulmonary nodules in the imaged lung apices, the largest a 9 mm lesion in the right upper lobe. Recommend comparison with outside imaging ofthe chest if available.   05/03/2021 Echocardiogram    1. Left ventricular ejection fraction, by estimation, is 55 to 60%. The left ventricle has normal function. The left ventricle has no regional wall motion abnormalities. There is mild left ventricular hypertrophy. Left ventricular diastolic parameters are consistent with Grade I diastolic dysfunction (impaired relaxation).  2. Prominent echogenic structure on the RV free wall, possibly attached to  the TV - measuring about 2.0 cm in diameter and 3.0 cm in length - some mobile filamentous material is attached, suggestive of thrombus or less likely tumor.. Right ventricular systolic function is normal. The right ventricular size is normal.  3. The mitral valve is grossly normal. Trivial mitral valve regurgitation.  4. The aortic valve is tricuspid. Aortic valve regurgitation is not visualized.     05/04/2021 Imaging   RIGHT: - There is no evidence of deep vein thrombosis in the lower extremity.   - No cystic structure found in the popliteal fossa.   LEFT: - There is no evidence of deep vein thrombosis in the lower extremity.   - No cystic structure found in the popliteal fossa.     07/12/2021 Imaging   1. A large, heterogeneous mass about and within the right ventricle is difficult to clearly assess due to cardiac motion and mixing artifact on this non tailored examination, but suspect it has significantly increased in size, and now involves the right atrium in addition to the right ventricle and pulmonary outflow tract, measuring approximately 6.9 x 4.9 cm, previously 4.5 x 4.2 cm when measured similarly. This may reflect a combination of metastatic soft tissue and thrombus, and could be more clearly assessed by echocardiography and cardiac  MRI. 2. No significant change of a right upper lobe pulmonary nodule, measuring 0.8 cm, which remains modestly suspicious for a metastasis. Other small nodules are likewise stable. Attention on follow-up. 3. Status post laryngectomy and tracheostomy. 4. Coronary artery disease.   07/24/2021 Echocardiogram    1. There is an echogenic RV and RVOT mass. Attachment appears to be on the right ventricular mid wall. Mass is 32 by 24 mm, though may be underestimated. Right ventricular systolic function is normal. The right ventricular size is normal. There is normal pulmonary artery systolic pressure.  2. Left ventricular ejection fraction, by estimation, is  60 to 65%. The left ventricle has normal function. The left ventricle has no regional wall motion abnormalities. Left ventricular diastolic parameters are consistent with Grade I diastolic dysfunction (impaired relaxation). The average left ventricular global longitudinal strain is -19.0 %. The global longitudinal strain is normal.  3. The mitral valve is normal in structure. No evidence of mitral valve regurgitation.  4. The aortic valve is tricuspid. Aortic valve regurgitation is not visualized. No aortic stenosis is present.   Comparison(s): A prior study was performed on 05/03/21. Prior images reviewed side by side. Similar size of RV mass to slight increase. Do not see right atrial involvement in this study.   Conclusion(s)/Recommendation(s): If concerned for tumor vs thombus, cardiac MRI could be considered   08/02/2021 Imaging   1. Large RV mass measuring 106mm x 91mm x 13mm. Mass is isointense to myocardium on T1 weighted imaging, does not suppress with fat saturation, isointense on T2 weighted imaging, perfuses on first pass perfusion, and heterogenous enhancement on LGE imaging. This is consistent with tumor, and in setting of known metastatic laryngeal cancer, is consistent with metastasis. There does appear to be thrombus adherent to tumor   2. Normal LV size and systolic function (EF 39%). No late gadolinium enhancement to suggest myocardial scar   3.  Normal RV size with mild systolic dysfunction (EF 76%)   08/17/2021 -  Chemotherapy   Patient is on Treatment Plan : HEAD/NECK Cisplatin q7d       PHYSICAL EXAMINATION: ECOG PERFORMANCE STATUS: 0 - Asymptomatic  Vitals:   08/03/21 0857  BP: 119/79  Pulse: 89  Resp: 18  Temp: (!) 97.4 F (36.3 C)  SpO2: 97%   Filed Weights   08/03/21 0857  Weight: 144 lb 6.4 oz (65.5 kg)    GENERAL:alert, no distress and comfortable NEURO: alert & oriented   LABORATORY DATA:  I have reviewed the data as listed    Component Value  Date/Time   NA 140 08/03/2021 0848   K 4.2 08/03/2021 0848   CL 104 08/03/2021 0848   CO2 23 08/03/2021 0848   GLUCOSE 99 08/03/2021 0848   BUN 22 08/03/2021 0848   CREATININE 0.78 08/03/2021 0848   CREATININE 0.71 04/13/2021 0827   CALCIUM 8.9 08/03/2021 0848   PROT 7.4 08/03/2021 0848   ALBUMIN 4.0 08/03/2021 0848   AST 19 08/03/2021 0848   AST 22 04/13/2021 0827   ALT 14 08/03/2021 0848   ALT 17 04/13/2021 0827   ALKPHOS 66 08/03/2021 0848   BILITOT 0.5 08/03/2021 0848   BILITOT 0.5 04/13/2021 0827   GFRNONAA >60 08/03/2021 0848   GFRNONAA >60 04/13/2021 0827   GFRAA >60 06/08/2020 1303   GFRAA >60 02/17/2020 1128    No results found for: SPEP, UPEP  Lab Results  Component Value Date   WBC 4.7 08/03/2021   NEUTROABS 2.6 08/03/2021  HGB 13.3 08/03/2021   HCT 40.4 08/03/2021   MCV 87.1 08/03/2021   PLT 217 08/03/2021      Chemistry      Component Value Date/Time   NA 140 08/03/2021 0848   K 4.2 08/03/2021 0848   CL 104 08/03/2021 0848   CO2 23 08/03/2021 0848   BUN 22 08/03/2021 0848   CREATININE 0.78 08/03/2021 0848   CREATININE 0.71 04/13/2021 0827      Component Value Date/Time   CALCIUM 8.9 08/03/2021 0848   ALKPHOS 66 08/03/2021 0848   AST 19 08/03/2021 0848   AST 22 04/13/2021 0827   ALT 14 08/03/2021 0848   ALT 17 04/13/2021 0827   BILITOT 0.5 08/03/2021 0848   BILITOT 0.5 04/13/2021 0827       RADIOGRAPHIC STUDIES: I have reviewed multiple imaging studies with the patient I have personally reviewed the radiological images as listed and agreed with the findings in the report. CT CHEST W CONTRAST  Result Date: 07/12/2021 CLINICAL DATA:  Laryngeal cancer, assess treatment response, assess intracardiac thrombus EXAM: CT CHEST WITH CONTRAST TECHNIQUE: Multidetector CT imaging of the chest was performed during intravenous contrast administration. CONTRAST:  108mL OMNIPAQUE IOHEXOL 350 MG/ML SOLN COMPARISON:  04/19/2021 FINDINGS: Cardiovascular:  Right chest port catheter. Aortic atherosclerosis. A large, heterogeneous mass about and within the right ventricle is difficult to clearly assess due to cardiac motion and mixing artifact on this non tailored examination, but suspect it has significantly increased in size, and now involves the right atrium in addition to the right ventricle and pulmonary outflow tract, measuring approximately 6.9 x 4.9 cm, previously 4.5 x 4.2 cm when measured similarly (series 2, image 102). Left and right coronary artery calcifications. No pericardial effusion. Mediastinum/Nodes: Unchanged prominent pretracheal lymph nodes, measuring up to 1.3 x 1.3 cm without overtly enlarged mediastinal or hilar lymphadenopathy. Status post laryngectomy and tracheostomy. Thyroid gland and esophagus demonstrate no significant findings. Lungs/Pleura: Minimal biapical radiation fibrosis. No significant change of a right upper lobe pulmonary nodule, measuring 0.8 cm (series 5, image 34). No pleural effusion or pneumothorax. Upper Abdomen: No acute abnormality. Musculoskeletal: No chest wall mass or suspicious bone lesions identified. IMPRESSION: 1. A large, heterogeneous mass about and within the right ventricle is difficult to clearly assess due to cardiac motion and mixing artifact on this non tailored examination, but suspect it has significantly increased in size, and now involves the right atrium in addition to the right ventricle and pulmonary outflow tract, measuring approximately 6.9 x 4.9 cm, previously 4.5 x 4.2 cm when measured similarly. This may reflect a combination of metastatic soft tissue and thrombus, and could be more clearly assessed by echocardiography and cardiac MRI. 2. No significant change of a right upper lobe pulmonary nodule, measuring 0.8 cm, which remains modestly suspicious for a metastasis. Other small nodules are likewise stable. Attention on follow-up. 3. Status post laryngectomy and tracheostomy. 4. Coronary  artery disease. Aortic Atherosclerosis (ICD10-I70.0). Electronically Signed   By: Delanna Ahmadi M.D.   On: 07/12/2021 11:00   MR CARDIAC MORPHOLOGY W WO CONTRAST  Result Date: 08/01/2021 CLINICAL DATA:  60F with laryngeal squamous cell carcinoma with lung mets. Evaluate RV mass EXAM: CARDIAC MRI TECHNIQUE: The patient was scanned on a 1.5 Tesla Siemens magnet. A dedicated cardiac coil was used. Functional imaging was done using Fiest[employer] sequences. 2,3, and 4 chamber views were done to assess for RWMA's. Modified Simpson's rule using a short axis stack was used to calculate an ejection  fraction on a dedicated work Conservation officer, nature. The patient received 7 cc of Gadavist. After 10 minutes inversion recovery sequences were used to assess for infiltration and scar tissue. CONTRAST:  7 cc  of Gadavist FINDINGS: Left ventricle: -Normal size -Normal systolic function -Mild ECV elevation (29%) -No LGE LV EF:  66% (Normal 56-78%) Absolute volumes: LV EDV: 60mL (Normal 52-141 mL) LV ESV: 76mL (Normal 13-51 mL) LV SV: 23mL (Normal 33-97 mL) CO: 3.6L/min (Normal 2.7-6.0 L/min) Indexed volumes: LV EDV: 8mL/sq-m (Normal 41-81 mL/sq-m) LV ESV: 72mL/sq-m (Normal 12-21 mL/sq-m) LV SV: 4mL/sq-m (Normal 26-56 mL/sq-m) CI: 2.1L/min/sq-m (Normal 1.8-3.8 L/min/sq-m) Right ventricle: -Large RV mass measuring 19mm x 54mm x 28mm. Mass is isointense on T1 weighted imaging, does not suppress with fat saturation, isointense on T2 weighted imaging, perfuses on first pass perfusion, and heterogenous enhancement on LGE imaging. -Normal size -Mild systolic dysfunction RV EF: 41% (Normal 47-80%) Absolute volumes: RV EDV: 161mL (Normal 58-154 mL) RV ESV: 66mL (Normal 12-68 mL) RV SV: 75mL (Normal 35-98 mL) CO: 3.9L/min (Normal 2.7-6 L/min) Indexed volumes: RV EDV: 89mL/sq-m (Normal 48-87 mL/sq-m) RV ESV: 25mL/sq-m (Normal 11-28 mL/sq-m) RV SV: 57mL/sq-m (Normal 27-57 mL/sq-m) CI: 2.3L/min/sq-m (Normal 1.8-3.8 L/min/sq-m) Left atrium:  Normal size Right atrium: Normal size Mitral valve: No regurgitation Aortic valve: No regurgitation Tricuspid valve: No regurgitation Pulmonic valve: No regurgitation Aorta: Normal proximal ascending aorta Pericardium: Normal IMPRESSION: 1. Large RV mass measuring 91mm x 16mm x 41mm. Mass is isointense to myocardium on T1 weighted imaging, does not suppress with fat saturation, isointense on T2 weighted imaging, perfuses on first pass perfusion, and heterogenous enhancement on LGE imaging. This is consistent with tumor, and in setting of known metastatic laryngeal cancer, is consistent with metastasis. There does appear to be thrombus adherent to tumor 2. Normal LV size and systolic function (EF 95%). No late gadolinium enhancement to suggest myocardial scar 3.  Normal RV size with mild systolic dysfunction (EF 09%) Electronically Signed   By: Oswaldo Milian M.D.   On: 08/01/2021 15:58   ECHOCARDIOGRAM COMPLETE  Result Date: 07/24/2021    ECHOCARDIOGRAM REPORT   Patient Name:   [name] [name] Date of Exam: 07/24/2021 Medical Rec #:  [id number]       Height:       62.0 in Accession #:    [id number]      Weight:       144.4 lb Date of Birth:  1950-12-28        BSA:          1.665 m Patient Age:    69 years        BP:           117/77 mmHg Patient Gender: F               HR:           76 bpm. Exam Location:  Outpatient Procedure: 2D Echo, Cardiac Doppler, Color Doppler and Strain Analysis Indications:    Chemo Z09                 mass/cancer/thrombus noted, on anticoagulation tx  History:        Patient has prior history of Echocardiogram examinations, most                 recent 05/03/2021.  Sonographer:    Bernadene Person RDCS Referring Phys: [id number] Gracelynn Bircher [place]  1. There is an echogenic RV and RVOT mass. Attachment appears to  be on the right ventricular mid wall. Mass is 32 by 24 mm, though may be underestimated. Right ventricular systolic function is normal. The right ventricular size is normal.  There is normal pulmonary artery systolic pressure.  2. Left ventricular ejection fraction, by estimation, is 60 to 65%. The left ventricle has normal function. The left ventricle has no regional wall motion abnormalities. Left ventricular diastolic parameters are consistent with Grade I diastolic dysfunction (impaired relaxation). The average left ventricular global longitudinal strain is -19.0 %. The global longitudinal strain is normal.  3. The mitral valve is normal in structure. No evidence of mitral valve regurgitation.  4. The aortic valve is tricuspid. Aortic valve regurgitation is not visualized. No aortic stenosis is present. Comparison(s): A prior study was performed on 05/03/21. Prior images reviewed side by side. Similar size of RV mass to slight increase. Do not see right atrial involvement in this study. Conclusion(s)/Recommendation(s): If concerned for tumor vs thombus, cardiac MRI could be considered. FINDINGS  Left Ventricle: Left ventricular ejection fraction, by estimation, is 60 to 65%. The left ventricle has normal function. The left ventricle has no regional wall motion abnormalities. The average left ventricular global longitudinal strain is -19.0 %. The global longitudinal strain is normal. The left ventricular internal cavity size was normal in size. There is no left ventricular hypertrophy. Left ventricular diastolic parameters are consistent with Grade I diastolic dysfunction (impaired relaxation). Right Ventricle: There is an echogenic RV and RVOT mass. Attachment appears to be on the right ventricular mid wall. Mass is 32 by 24 mm, though may be underestimated. The right ventricular size is normal. No increase in right ventricular wall thickness.  Right ventricular systolic function is normal. There is normal pulmonary artery systolic pressure. The tricuspid regurgitant velocity is 2.05 m/s, and with an assumed right atrial pressure of 3 mmHg, the estimated right ventricular systolic  pressure is 13.2 mmHg. Left Atrium: Left atrial size was normal in size. Right Atrium: Right atrial size was normal in size. Pericardium: There is no evidence of pericardial effusion. Mitral Valve: The mitral valve is normal in structure. No evidence of mitral valve regurgitation. Tricuspid Valve: The tricuspid valve is normal in structure. Tricuspid valve regurgitation is not demonstrated. Aortic Valve: The aortic valve is tricuspid. Aortic valve regurgitation is not visualized. No aortic stenosis is present. Pulmonic Valve: The pulmonic valve was not well visualized. Pulmonic valve regurgitation is not visualized. No evidence of pulmonic stenosis. Aorta: The aortic root and ascending aorta are structurally normal, with no evidence of dilitation. IAS/Shunts: The atrial septum is grossly normal.  LEFT VENTRICLE PLAX 2D LVIDd:         4.20 cm   Diastology LVIDs:         2.90 cm   LV e' medial:    7.88 cm/s LV PW:         1.00 cm   LV E/e' medial:  6.1 LV IVS:        1.00 cm   LV e' lateral:   9.00 cm/s LVOT diam:     1.90 cm   LV E/e' lateral: 5.3 LV SV:         44 LV SV Index:   27        2D Longitudinal Strain LVOT Area:     2.84 cm  2D Strain GLS Avg:     -19.0 %  RIGHT VENTRICLE RV S prime:     6.45 cm/s TAPSE (M-mode): 1.2 cm LEFT ATRIUM  Index        RIGHT ATRIUM           Index LA diam:        2.80 cm 1.68 cm/m   RA Area:     12.60 cm LA Vol (A2C):   29.2 ml 17.54 ml/m  RA Volume:   26.90 ml  16.16 ml/m LA Vol (A4C):   20.5 ml 12.32 ml/m LA Biplane Vol: 24.8 ml 14.90 ml/m  AORTIC VALVE LVOT Vmax:   78.40 cm/s LVOT Vmean:  51.000 cm/s LVOT VTI:    0.156 m  AORTA Ao Root diam: 3.20 cm Ao Asc diam:  3.50 cm MITRAL VALVE               TRICUSPID VALVE MV Area (PHT): 3.17 cm    TR Peak grad:   16.8 mmHg MV Decel Time: 239 msec    TR Vmax:        205.00 cm/s MV E velocity: 48.00 cm/s MV A velocity: 65.80 cm/s  SHUNTS MV E/A ratio:  0.73        Systemic VTI:  0.16 m                             Systemic Diam: 1.90 cm Rudean Haskell MD Electronically signed by Rudean Haskell MD Signature Date/Time: 07/24/2021/2:11:58 PM    Final

## 2021-08-03 NOTE — Assessment & Plan Note (Addendum)
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

## 2021-08-03 NOTE — Progress Notes (Signed)
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

## 2021-08-08 ENCOUNTER — Ambulatory Visit (HOSPITAL_COMMUNITY): Admission: RE | Admit: 2021-08-08 | Payer: Medicare HMO | Source: Ambulatory Visit

## 2021-08-08 DIAGNOSIS — Z87891 Personal history of nicotine dependence: Secondary | ICD-10-CM | POA: Diagnosis not present

## 2021-08-08 DIAGNOSIS — C329 Malignant neoplasm of larynx, unspecified: Secondary | ICD-10-CM | POA: Diagnosis not present

## 2021-08-08 DIAGNOSIS — Z930 Tracheostomy status: Secondary | ICD-10-CM | POA: Diagnosis not present

## 2021-08-09 NOTE — Progress Notes (Signed)
Pharmacist Chemotherapy Monitoring - Initial Assessment    Anticipated start date: 08/17/21   The following has been reviewed per standard work regarding the patient's treatment regimen: The patient's diagnosis, treatment plan and drug doses, and organ/hematologic function Lab orders and baseline tests specific to treatment regimen  The treatment plan start date, drug sequencing, and pre-medications Prior authorization status  Patient's documented medication list, including drug-drug interaction screen and prescriptions for anti-emetics and supportive care specific to the treatment regimen The drug concentrations, fluid compatibility, administration routes, and timing of the medications to be used The patient's access for treatment and lifetime cumulative dose history, if applicable  The patient's medication allergies and previous infusion related reactions, if applicable   Changes made to treatment plan:  N/A  Follow up needed:  N/A   Philomena Course, RPH, 08/09/2021  1:35 PM

## 2021-08-14 ENCOUNTER — Other Ambulatory Visit: Payer: Self-pay

## 2021-08-14 ENCOUNTER — Other Ambulatory Visit: Payer: Self-pay | Admitting: Hematology and Oncology

## 2021-08-14 ENCOUNTER — Inpatient Hospital Stay: Payer: Medicare HMO

## 2021-08-14 DIAGNOSIS — Z7901 Long term (current) use of anticoagulants: Secondary | ICD-10-CM | POA: Diagnosis not present

## 2021-08-14 DIAGNOSIS — Z5111 Encounter for antineoplastic chemotherapy: Secondary | ICD-10-CM | POA: Diagnosis not present

## 2021-08-14 DIAGNOSIS — Z5112 Encounter for antineoplastic immunotherapy: Secondary | ICD-10-CM | POA: Diagnosis not present

## 2021-08-14 DIAGNOSIS — C321 Malignant neoplasm of supraglottis: Secondary | ICD-10-CM | POA: Diagnosis not present

## 2021-08-14 DIAGNOSIS — Z79899 Other long term (current) drug therapy: Secondary | ICD-10-CM | POA: Diagnosis not present

## 2021-08-14 DIAGNOSIS — C329 Malignant neoplasm of larynx, unspecified: Secondary | ICD-10-CM

## 2021-08-14 DIAGNOSIS — Z95828 Presence of other vascular implants and grafts: Secondary | ICD-10-CM

## 2021-08-14 DIAGNOSIS — C7989 Secondary malignant neoplasm of other specified sites: Secondary | ICD-10-CM | POA: Diagnosis not present

## 2021-08-14 LAB — CBC WITH DIFFERENTIAL (CANCER CENTER ONLY)
Abs Immature Granulocytes: 0.01 10*3/uL (ref 0.00–0.07)
Basophils Absolute: 0 10*3/uL (ref 0.0–0.1)
Basophils Relative: 0 %
Eosinophils Absolute: 0.3 10*3/uL (ref 0.0–0.5)
Eosinophils Relative: 6 %
HCT: 39.4 % (ref 36.0–46.0)
Hemoglobin: 13.4 g/dL (ref 12.0–15.0)
Immature Granulocytes: 0 %
Lymphocytes Relative: 26 %
Lymphs Abs: 1.2 10*3/uL (ref 0.7–4.0)
MCH: 29.1 pg (ref 26.0–34.0)
MCHC: 34 g/dL (ref 30.0–36.0)
MCV: 85.5 fL (ref 80.0–100.0)
Monocytes Absolute: 0.5 10*3/uL (ref 0.1–1.0)
Monocytes Relative: 10 %
Neutro Abs: 2.6 10*3/uL (ref 1.7–7.7)
Neutrophils Relative %: 58 %
Platelet Count: 238 10*3/uL (ref 150–400)
RBC: 4.61 MIL/uL (ref 3.87–5.11)
RDW: 13.4 % (ref 11.5–15.5)
WBC Count: 4.5 10*3/uL (ref 4.0–10.5)
nRBC: 0 % (ref 0.0–0.2)

## 2021-08-14 LAB — CMP (CANCER CENTER ONLY)
ALT: 16 U/L (ref 0–44)
AST: 19 U/L (ref 15–41)
Albumin: 3.9 g/dL (ref 3.5–5.0)
Alkaline Phosphatase: 72 U/L (ref 38–126)
Anion gap: 10 (ref 5–15)
BUN: 21 mg/dL (ref 8–23)
CO2: 26 mmol/L (ref 22–32)
Calcium: 9 mg/dL (ref 8.9–10.3)
Chloride: 104 mmol/L (ref 98–111)
Creatinine: 0.72 mg/dL (ref 0.44–1.00)
GFR, Estimated: >60 mL/min (ref >60)
Glucose, Bld: 99 mg/dL (ref 70–99)
Potassium: 4 mmol/L (ref 3.5–5.1)
Sodium: 140 mmol/L (ref 135–145)
Total Bilirubin: 0.3 mg/dL (ref 0.3–1.2)
Total Protein: 7.4 g/dL (ref 6.5–8.1)

## 2021-08-14 LAB — MAGNESIUM: Magnesium: 2.2 mg/dL (ref 1.7–2.4)

## 2021-08-14 MED ORDER — SODIUM CHLORIDE 0.9% FLUSH
10.0000 mL | Freq: Once | INTRAVENOUS | Status: AC
  Administered 2021-08-14: 10 mL

## 2021-08-14 MED ORDER — HEPARIN SOD (PORK) LOCK FLUSH 100 UNIT/ML IV SOLN
500.0000 [IU] | Freq: Once | INTRAVENOUS | Status: AC
  Administered 2021-08-14: 500 [IU]

## 2021-08-15 MED FILL — Dexamethasone Sodium Phosphate Inj 100 MG/10ML: INTRAMUSCULAR | Qty: 1 | Status: AC

## 2021-08-15 MED FILL — Fosaprepitant Dimeglumine For IV Infusion 150 MG (Base Eq): INTRAVENOUS | Qty: 5 | Status: AC

## 2021-08-17 ENCOUNTER — Inpatient Hospital Stay: Payer: Medicare HMO

## 2021-08-17 ENCOUNTER — Other Ambulatory Visit (HOSPITAL_COMMUNITY): Payer: Self-pay

## 2021-08-17 ENCOUNTER — Encounter: Payer: Self-pay | Admitting: Emergency Medicine

## 2021-08-17 ENCOUNTER — Inpatient Hospital Stay: Payer: Medicare HMO | Admitting: Dietician

## 2021-08-17 ENCOUNTER — Other Ambulatory Visit: Payer: Self-pay

## 2021-08-17 ENCOUNTER — Other Ambulatory Visit: Payer: Self-pay | Admitting: Hematology and Oncology

## 2021-08-17 VITALS — BP 116/73 | HR 89 | Temp 97.8°F | Resp 16 | Wt 147.0 lb

## 2021-08-17 DIAGNOSIS — Z79899 Other long term (current) drug therapy: Secondary | ICD-10-CM | POA: Diagnosis not present

## 2021-08-17 DIAGNOSIS — C321 Malignant neoplasm of supraglottis: Secondary | ICD-10-CM | POA: Diagnosis not present

## 2021-08-17 DIAGNOSIS — Z7901 Long term (current) use of anticoagulants: Secondary | ICD-10-CM | POA: Diagnosis not present

## 2021-08-17 DIAGNOSIS — C329 Malignant neoplasm of larynx, unspecified: Secondary | ICD-10-CM

## 2021-08-17 DIAGNOSIS — Z5112 Encounter for antineoplastic immunotherapy: Secondary | ICD-10-CM | POA: Diagnosis not present

## 2021-08-17 DIAGNOSIS — C7989 Secondary malignant neoplasm of other specified sites: Secondary | ICD-10-CM | POA: Diagnosis not present

## 2021-08-17 DIAGNOSIS — Z5111 Encounter for antineoplastic chemotherapy: Secondary | ICD-10-CM | POA: Diagnosis not present

## 2021-08-17 MED ORDER — ONDANSETRON HCL 8 MG PO TABS
8.0000 mg | ORAL_TABLET | Freq: Three times a day (TID) | ORAL | 3 refills | Status: DC | PRN
  Filled 2021-08-17: qty 30, 10d supply, fill #0

## 2021-08-17 MED ORDER — PALONOSETRON HCL INJECTION 0.25 MG/5ML
0.2500 mg | Freq: Once | INTRAVENOUS | Status: AC
  Administered 2021-08-17: 0.25 mg via INTRAVENOUS
  Filled 2021-08-17: qty 5

## 2021-08-17 MED ORDER — SODIUM CHLORIDE 0.9% FLUSH
10.0000 mL | INTRAVENOUS | Status: DC | PRN
  Administered 2021-08-17: 10 mL

## 2021-08-17 MED ORDER — SODIUM CHLORIDE 0.9 % IV SOLN
150.0000 mg | Freq: Once | INTRAVENOUS | Status: AC
  Administered 2021-08-17: 150 mg via INTRAVENOUS
  Filled 2021-08-17: qty 150

## 2021-08-17 MED ORDER — MAGNESIUM SULFATE 2 GM/50ML IV SOLN
2.0000 g | Freq: Once | INTRAVENOUS | Status: AC
  Administered 2021-08-17: 2 g via INTRAVENOUS
  Filled 2021-08-17: qty 50

## 2021-08-17 MED ORDER — SODIUM CHLORIDE 0.9 % IV SOLN
10.0000 mg | Freq: Once | INTRAVENOUS | Status: AC
  Administered 2021-08-17: 10 mg via INTRAVENOUS
  Filled 2021-08-17: qty 10

## 2021-08-17 MED ORDER — PROCHLORPERAZINE MALEATE 10 MG PO TABS
10.0000 mg | ORAL_TABLET | Freq: Four times a day (QID) | ORAL | 1 refills | Status: DC | PRN
  Filled 2021-08-17: qty 30, 8d supply, fill #0

## 2021-08-17 MED ORDER — SODIUM CHLORIDE 0.9 % IV SOLN: Freq: Once | INTRAVENOUS | Status: AC

## 2021-08-17 MED ORDER — POTASSIUM CHLORIDE IN NACL 20-0.9 MEQ/L-% IV SOLN
Freq: Once | INTRAVENOUS | Status: AC
  Filled 2021-08-17: qty 1000

## 2021-08-17 MED ORDER — SODIUM CHLORIDE 0.9 % IV SOLN
40.0000 mg/m2 | Freq: Once | INTRAVENOUS | Status: AC
  Administered 2021-08-17: 68 mg via INTRAVENOUS
  Filled 2021-08-17: qty 68

## 2021-08-17 MED ORDER — HEPARIN SOD (PORK) LOCK FLUSH 100 UNIT/ML IV SOLN
500.0000 [IU] | Freq: Once | INTRAVENOUS | Status: AC | PRN
  Administered 2021-08-17: 500 [IU]

## 2021-08-17 NOTE — Patient Instructions (Signed)
[facility]   Discharge Instructions: Thank you for choosing [facility] to provide your oncology and hematology care.   If you have a lab appointment with the [facility], please go directly to the [facility] and check in at the registration area.   Wear comfortable clothing and clothing appropriate for easy access to any Portacath or PICC line.   We strive to give you quality time with your provider. You may need to reschedule your appointment if you arrive late (15 or more minutes).  Arriving late affects you and other patients whose appointments are after yours.  Also, if you miss three or more appointments without notifying the office, you may be dismissed from the clinic at the provider's discretion.      For prescription refill requests, have your pharmacy contact our office and allow 72 hours for refills to be completed.    Today you received the following chemotherapy and/or immunotherapy agents: cisplatin      To help prevent nausea and vomiting after your treatment, we encourage you to take your nausea medication as directed.  BELOW ARE SYMPTOMS THAT SHOULD BE REPORTED IMMEDIATELY: *FEVER GREATER THAN 100.4 F (38 C) OR HIGHER *CHILLS OR SWEATING *NAUSEA AND VOMITING THAT IS NOT CONTROLLED WITH YOUR NAUSEA MEDICATION *UNUSUAL SHORTNESS OF BREATH *UNUSUAL BRUISING OR BLEEDING *URINARY PROBLEMS (pain or burning when urinating, or frequent urination) *BOWEL PROBLEMS (unusual diarrhea, constipation, pain near the anus) TENDERNESS IN MOUTH AND THROAT WITH OR WITHOUT PRESENCE OF ULCERS (sore throat, sores in mouth, or a toothache) UNUSUAL RASH, SWELLING OR PAIN  UNUSUAL VAGINAL DISCHARGE OR ITCHING   Items with * indicate a potential emergency and should be followed up as soon as possible or go to the Emergency Department if any problems should occur.  Please show the CHEMOTHERAPY ALERT CARD or IMMUNOTHERAPY ALERT CARD at check-in  to the Emergency Department and triage nurse.  Should you have questions after your visit or need to cancel or reschedule your appointment, please contact [facility]  Dept: [phone]  and follow the prompts.  Office hours are 8:00 a.m. to 4:30 p.m. Monday - Friday. Please note that voicemails left after 4:00 p.m. may not be returned until the following business day.  We are closed weekends and major holidays. You have access to a nurse at all times for urgent questions. Please call the main number to the clinic Dept: [phone] and follow the prompts.   For any non-urgent questions, you may also contact your provider using MyChart. We now offer e-Visits for anyone 24 and older to request care online for non-urgent symptoms. For details visit [email or web address].   Also download the MyChart app! Go to the app store, search "MyChart", open the app, select [facility], and log in with your MyChart username and password.  Due to Covid, a mask is required upon entering the hospital/clinic. If you do not have a mask, one will be given to you upon arrival. For doctor visits, patients may have 1 support person aged 54 or older with them. For treatment visits, patients cannot have anyone with them due to current Covid guidelines and our immunocompromised population.   Cisplatin injection What is this medication? CISPLATIN (SIS pla tin) is a chemotherapy drug. It targets fast dividing cells, like cancer cells, and causes these cells to die. This medicine is used to treat many types of cancer like bladder, ovarian, and testicular cancers. This medicine may be  used for other purposes; ask your health care provider or pharmacist if you have questions. COMMON BRAND NAME(S): Platinol, Platinol -AQ What should I tell my care team before I take this medication? They need to know if you have any of these conditions: eye disease, vision problems hearing problems kidney  disease low blood counts, like white cells, platelets, or red blood cells tingling of the fingers or toes, or other nerve disorder an unusual or allergic reaction to cisplatin, carboplatin, oxaliplatin, other medicines, foods, dyes, or preservatives pregnant or trying to get pregnant breast-feeding How should I use this medication? This drug is given as an infusion into a vein. It is administered in a hospital or clinic by a specially trained health care professional. Talk to your pediatrician regarding the use of this medicine in children. Special care may be needed. Overdosage: If you think you have taken too much of this medicine contact a poison control center or emergency room at once. NOTE: This medicine is only for you. Do not share this medicine with others. What if I miss a dose? It is important not to miss a dose. Call your doctor or health care professional if you are unable to keep an appointment. What may interact with this medication? This medicine may interact with the following medications: foscarnet certain antibiotics like amikacin, gentamicin, neomycin, polymyxin B, streptomycin, tobramycin, vancomycin This list may not describe all possible interactions. Give your health care provider a list of all the medicines, herbs, non-prescription drugs, or dietary supplements you use. Also tell them if you smoke, drink alcohol, or use illegal drugs. Some items may interact with your medicine. What should I watch for while using this medication? Your condition will be monitored carefully while you are receiving this medicine. You will need important blood work done while you are taking this medicine. This drug may make you feel generally unwell. This is not uncommon, as chemotherapy can affect healthy cells as well as cancer cells. Report any side effects. Continue your course of treatment even though you feel ill unless your doctor tells you to stop. This medicine may increase your  risk of getting an infection. Call your healthcare professional for advice if you get a fever, chills, or sore throat, or other symptoms of a cold or flu. Do not treat yourself. Try to avoid being around people who are sick. Avoid taking medicines that contain aspirin, acetaminophen, ibuprofen, naproxen, or ketoprofen unless instructed by your healthcare professional. These medicines may hide a fever. This medicine may increase your risk to bruise or bleed. Call your doctor or health care professional if you notice any unusual bleeding. Be careful brushing and flossing your teeth or using a toothpick because you may get an infection or bleed more easily. If you have any dental work done, tell your dentist you are receiving this medicine. Do not become pregnant while taking this medicine or for 14 months after stopping it. Women should inform their healthcare professional if they wish to become pregnant or think they might be pregnant. Men should not father a child while taking this medicine and for 11 months after stopping it. There is potential for serious side effects to an unborn child. Talk to your healthcare professional for more information. Do not breast-feed an infant while taking this medicine. This medicine has caused ovarian failure in some women. This medicine may make it more difficult to get pregnant. Talk to your healthcare professional if you are concerned about your fertility. This medicine has  caused decreased sperm counts in some men. This may make it more difficult to father a child. Talk to your healthcare professional if you are concerned about your fertility. Drink fluids as directed while you are taking this medicine. This will help protect your kidneys. Call your doctor or health care professional if you get diarrhea. Do not treat yourself. What side effects may I notice from receiving this medication? Side effects that you should report to your doctor or health care professional  as soon as possible: allergic reactions like skin rash, itching or hives, swelling of the face, lips, or tongue blurred vision changes in vision decreased hearing or ringing of the ears nausea, vomiting pain, redness, or irritation at site where injected pain, tingling, numbness in the hands or feet signs and symptoms of bleeding such as bloody or black, tarry stools; red or dark brown urine; spitting up blood or brown material that looks like coffee grounds; red spots on the skin; unusual bruising or bleeding from the eyes, gums, or nose signs and symptoms of infection like fever; chills; cough; sore throat; pain or trouble passing urine signs and symptoms of kidney injury like trouble passing urine or change in the amount of urine signs and symptoms of low red blood cells or anemia such as unusually weak or tired; feeling faint or lightheaded; falls; breathing problems Side effects that usually do not require medical attention (report to your doctor or health care professional if they continue or are bothersome): loss of appetite mouth sores muscle cramps This list may not describe all possible side effects. Call your doctor for medical advice about side effects. You may report side effects to FDA at 1-800-FDA-1088. Where should I keep my medication? This drug is given in a hospital or clinic and will not be stored at home. NOTE: This sheet is a summary. It may not cover all possible information. If you have questions about this medicine, talk to your doctor, pharmacist, or health care provider.  2022 Elsevier/Gold Standard (2021-05-29 00:00:00)

## 2021-08-17 NOTE — Research (Signed)
A Randomized pragmatic Chair-Based Home Exercise Intervention for Mitigating Cancer-Related Fatigue in Older Adults Undergoing Chemotherapy for Advanced Disease  08/17/21  Patient [name] [name] was referred by Dr. Alvy Bimler as a potential candidate for the above listed study.  This Clinical Research Coordinator met with [name] [name], MRN [id number], on 08/17/21 in a manner and location that ensures patient privacy to discuss participation in the above listed research study.  Patient is Unaccompanied.  A copy of the informed consent document and separate HIPAA Authorization was provided to the patient.  Patient reads, speaks, and understands [place].   Patient was provided with the business card of this Coordinator and encouraged to contact the research team with any questions.  Approximately 20 minutes were spent with the patient reviewing the informed consent documents.  Patient was provided the option of taking informed consent documents home to review and was encouraged to review at their convenience with their support network, including other care providers. Patient took the consent documents home to review.  The patient rates her fatigue in the past week as a 5 on a scale of 0-10.  She reports in the past 6 months she is not currently exercising regularly.  The patient asked for this coordinator to call her roommate Cyndy at [phone] early next week to follow up concerning her interest in participating.   Clabe Seal Clinical Research Coordinator I  08/17/21 11:53 AM

## 2021-08-17 NOTE — Progress Notes (Addendum)
Pharmacist Chemotherapy Monitoring - Initial Assessment    Anticipated start date: 08/17/21   The following has been reviewed per standard work regarding the patient's treatment regimen: The patient's diagnosis, treatment plan and drug doses, and organ/hematologic function Lab orders and baseline tests specific to treatment regimen  The treatment plan start date, drug sequencing, and pre-medications Prior authorization status  Patient's documented medication list, including drug-drug interaction screen and prescriptions for anti-emetics and supportive care specific to the treatment regimen The drug concentrations, fluid compatibility, administration routes, and timing of the medications to be used The patient's access for treatment and lifetime cumulative dose history, if applicable  The patient's medication allergies and previous infusion related reactions, if applicable   Changes made to treatment plan:  treatment plan date  Follow up needed:  N/A   Wynona Neat, [facility], 08/17/2021  8:52 AM

## 2021-08-17 NOTE — Progress Notes (Signed)
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 She is receiving first cycle of chemotherapy with cisplatin today.   Met with patient during infusion. She continues using feeding tube, but has been working to increase oral intake. Patient has  a bowl of cream of wheat for breakfast each morning. She is drinking 2 Boost Plus, 3 [employer] 1.5 and 5 (8oz) bottles of water by mouth. Patient is giving [place] 1.5 and 3 (8oz) bottles of water via tube. Patient reports she did not bring [employer] with her today for lunch. Will provide samples today. She denies nausea, vomiting, constipation, diarrhea.   Medications: reviewed  Labs: 11/22 labs reviewed  Anthropometrics: Weight 147 lb today increased from 144 lb 6.4 oz on 11/3  10/21 - 144 lb 6.4 oz 9/23 - 144 lb 3.2 oz   NUTRITION DIAGNOSIS: Food and nutrition related knowledge deficit improved     INTERVENTION:  Ongoing encouragement for pt to continue strategies for increasing oral intake of soft, smooth foods Continue Anda [employer] 1.5 - 5 cartons/day orally and via PEG Samples of [employer] chocolate shake for pt to try orally as well as [employer] 1.4, bottle of water, and syringe for tube feeding during infusion today Patient has contact information     MONITORING, EVALUATION, GOAL: weight trends, intake, tube feeding   NEXT VISIT: To be scheduled with treatment

## 2021-08-22 ENCOUNTER — Telehealth: Payer: Self-pay | Admitting: Emergency Medicine

## 2021-08-22 DIAGNOSIS — R633 Feeding difficulties, unspecified: Secondary | ICD-10-CM | POA: Diagnosis not present

## 2021-08-22 DIAGNOSIS — C321 Malignant neoplasm of supraglottis: Secondary | ICD-10-CM | POA: Diagnosis not present

## 2021-08-22 NOTE — Telephone Encounter (Signed)
A Randomized pragmatic Chair-Based Home Exercise Intervention for Mitigating Cancer-Related Fatigue in Older Adults Undergoing Chemotherapy for Advanced Disease  08/22/21  10:00am: Called and spoke to [place] regarding the patient's interest in participating in this research study.  She states the patient is requesting time to read consent documents and requests a call back this afternoon.  2:00pm: Spoke again to [employer].  She states she and Orlean discussed the study and the patient has decided to not participate.  She was thanked for her time and consideration.  Clabe Seal Clinical Research Coordinator I  08/22/21 2:04 PM

## 2021-08-23 MED FILL — Dexamethasone Sodium Phosphate Inj 100 MG/10ML: INTRAMUSCULAR | Qty: 1 | Status: AC

## 2021-08-23 MED FILL — Fosaprepitant Dimeglumine For IV Infusion 150 MG (Base Eq): INTRAVENOUS | Qty: 5 | Status: AC

## 2021-08-24 ENCOUNTER — Inpatient Hospital Stay: Payer: Medicare HMO | Attending: Hematology

## 2021-08-24 ENCOUNTER — Encounter: Payer: Self-pay | Admitting: Hematology and Oncology

## 2021-08-24 ENCOUNTER — Inpatient Hospital Stay: Payer: Medicare HMO

## 2021-08-24 ENCOUNTER — Other Ambulatory Visit (HOSPITAL_COMMUNITY): Payer: Self-pay

## 2021-08-24 ENCOUNTER — Other Ambulatory Visit: Payer: Self-pay

## 2021-08-24 ENCOUNTER — Inpatient Hospital Stay (HOSPITAL_BASED_OUTPATIENT_CLINIC_OR_DEPARTMENT_OTHER): Payer: Medicare HMO | Admitting: Hematology and Oncology

## 2021-08-24 ENCOUNTER — Other Ambulatory Visit: Payer: Self-pay | Admitting: Hematology and Oncology

## 2021-08-24 DIAGNOSIS — D701 Agranulocytosis secondary to cancer chemotherapy: Secondary | ICD-10-CM | POA: Diagnosis not present

## 2021-08-24 DIAGNOSIS — Z7901 Long term (current) use of anticoagulants: Secondary | ICD-10-CM | POA: Insufficient documentation

## 2021-08-24 DIAGNOSIS — C7989 Secondary malignant neoplasm of other specified sites: Secondary | ICD-10-CM | POA: Diagnosis not present

## 2021-08-24 DIAGNOSIS — Z5111 Encounter for antineoplastic chemotherapy: Secondary | ICD-10-CM | POA: Diagnosis not present

## 2021-08-24 DIAGNOSIS — T451X5A Adverse effect of antineoplastic and immunosuppressive drugs, initial encounter: Secondary | ICD-10-CM | POA: Insufficient documentation

## 2021-08-24 DIAGNOSIS — C321 Malignant neoplasm of supraglottis: Secondary | ICD-10-CM | POA: Insufficient documentation

## 2021-08-24 DIAGNOSIS — C329 Malignant neoplasm of larynx, unspecified: Secondary | ICD-10-CM

## 2021-08-24 DIAGNOSIS — Z95828 Presence of other vascular implants and grafts: Secondary | ICD-10-CM

## 2021-08-24 DIAGNOSIS — G893 Neoplasm related pain (acute) (chronic): Secondary | ICD-10-CM | POA: Diagnosis not present

## 2021-08-24 DIAGNOSIS — Z79899 Other long term (current) drug therapy: Secondary | ICD-10-CM | POA: Insufficient documentation

## 2021-08-24 LAB — CBC WITH DIFFERENTIAL (CANCER CENTER ONLY)
Abs Immature Granulocytes: 0.02 10*3/uL (ref 0.00–0.07)
Basophils Absolute: 0 10*3/uL (ref 0.0–0.1)
Basophils Relative: 1 %
Eosinophils Absolute: 0.1 10*3/uL (ref 0.0–0.5)
Eosinophils Relative: 3 %
HCT: 39.3 % (ref 36.0–46.0)
Hemoglobin: 13.5 g/dL (ref 12.0–15.0)
Immature Granulocytes: 1 %
Lymphocytes Relative: 24 %
Lymphs Abs: 1 10*3/uL (ref 0.7–4.0)
MCH: 29.1 pg (ref 26.0–34.0)
MCHC: 34.4 g/dL (ref 30.0–36.0)
MCV: 84.7 fL (ref 80.0–100.0)
Monocytes Absolute: 0.5 10*3/uL (ref 0.1–1.0)
Monocytes Relative: 12 %
Neutro Abs: 2.6 10*3/uL (ref 1.7–7.7)
Neutrophils Relative %: 59 %
Platelet Count: 231 10*3/uL (ref 150–400)
RBC: 4.64 MIL/uL (ref 3.87–5.11)
RDW: 13.1 % (ref 11.5–15.5)
WBC Count: 4.3 10*3/uL (ref 4.0–10.5)
nRBC: 0 % (ref 0.0–0.2)

## 2021-08-24 LAB — BASIC METABOLIC PANEL - CANCER CENTER ONLY
Anion gap: 13 (ref 5–15)
BUN: 20 mg/dL (ref 8–23)
CO2: 22 mmol/L (ref 22–32)
Calcium: 8.9 mg/dL (ref 8.9–10.3)
Chloride: 103 mmol/L (ref 98–111)
Creatinine: 0.77 mg/dL (ref 0.44–1.00)
GFR, Estimated: >60 mL/min (ref >60)
Glucose, Bld: 101 mg/dL — ABNORMAL HIGH (ref 70–99)
Potassium: 4.1 mmol/L (ref 3.5–5.1)
Sodium: 138 mmol/L (ref 135–145)

## 2021-08-24 LAB — MAGNESIUM: Magnesium: 2.3 mg/dL (ref 1.7–2.4)

## 2021-08-24 MED ORDER — SODIUM CHLORIDE 0.9 % IV SOLN: Freq: Once | INTRAVENOUS | Status: AC

## 2021-08-24 MED ORDER — MAGNESIUM SULFATE 2 GM/50ML IV SOLN
2.0000 g | Freq: Once | INTRAVENOUS | Status: AC
  Administered 2021-08-24: 2 g via INTRAVENOUS
  Filled 2021-08-24: qty 50

## 2021-08-24 MED ORDER — APIXABAN 2.5 MG PO TABS: 2.5000 mg | ORAL_TABLET | Freq: Two times a day (BID) | ORAL | 3 refills | Status: DC

## 2021-08-24 MED ORDER — HYDROCODONE-ACETAMINOPHEN 5-325 MG PO TABS
1.0000 | ORAL_TABLET | Freq: Four times a day (QID) | ORAL | 0 refills | Status: DC | PRN
  Filled 2021-08-24: qty 60, 15d supply, fill #0

## 2021-08-24 MED ORDER — POTASSIUM CHLORIDE IN NACL 20-0.9 MEQ/L-% IV SOLN
Freq: Once | INTRAVENOUS | Status: AC
  Filled 2021-08-24: qty 1000

## 2021-08-24 MED ORDER — PALONOSETRON HCL INJECTION 0.25 MG/5ML
0.2500 mg | Freq: Once | INTRAVENOUS | Status: AC
  Administered 2021-08-24: 0.25 mg via INTRAVENOUS
  Filled 2021-08-24: qty 5

## 2021-08-24 MED ORDER — APIXABAN 2.5 MG PO TABS
2.5000 mg | ORAL_TABLET | Freq: Two times a day (BID) | ORAL | 3 refills | Status: DC
  Filled 2021-08-24: qty 60, 30d supply, fill #0
  Filled 2021-09-14: qty 60, 30d supply, fill #1
  Filled 2021-10-19: qty 60, 30d supply, fill #2
  Filled 2021-11-23: qty 60, 30d supply, fill #3

## 2021-08-24 MED ORDER — SODIUM CHLORIDE 0.9 % IV SOLN
10.0000 mg | Freq: Once | INTRAVENOUS | Status: AC
  Administered 2021-08-24: 10 mg via INTRAVENOUS
  Filled 2021-08-24: qty 10

## 2021-08-24 MED ORDER — SODIUM CHLORIDE 0.9 % IV SOLN
150.0000 mg | Freq: Once | INTRAVENOUS | Status: AC
  Administered 2021-08-24: 150 mg via INTRAVENOUS
  Filled 2021-08-24: qty 150

## 2021-08-24 MED ORDER — SODIUM CHLORIDE 0.9 % IV SOLN
40.0000 mg/m2 | Freq: Once | INTRAVENOUS | Status: AC
  Administered 2021-08-24: 68 mg via INTRAVENOUS
  Filled 2021-08-24: qty 68

## 2021-08-24 MED ORDER — SODIUM CHLORIDE 0.9% FLUSH
10.0000 mL | Freq: Once | INTRAVENOUS | Status: AC
  Administered 2021-08-24: 10 mL

## 2021-08-24 NOTE — Patient Instructions (Signed)
[facility]   Discharge Instructions: Thank you for choosing [facility] to provide your oncology and hematology care.   If you have a lab appointment with the [facility], please go directly to the [facility] and check in at the registration area.   Wear comfortable clothing and clothing appropriate for easy access to any Portacath or PICC line.   We strive to give you quality time with your provider. You may need to reschedule your appointment if you arrive late (15 or more minutes).  Arriving late affects you and other patients whose appointments are after yours.  Also, if you miss three or more appointments without notifying the office, you may be dismissed from the clinic at the provider's discretion.      For prescription refill requests, have your pharmacy contact our office and allow 72 hours for refills to be completed.    Today you received the following chemotherapy and/or immunotherapy agents: cisplatin      To help prevent nausea and vomiting after your treatment, we encourage you to take your nausea medication as directed.  BELOW ARE SYMPTOMS THAT SHOULD BE REPORTED IMMEDIATELY: *FEVER GREATER THAN 100.4 F (38 C) OR HIGHER *CHILLS OR SWEATING *NAUSEA AND VOMITING THAT IS NOT CONTROLLED WITH YOUR NAUSEA MEDICATION *UNUSUAL SHORTNESS OF BREATH *UNUSUAL BRUISING OR BLEEDING *URINARY PROBLEMS (pain or burning when urinating, or frequent urination) *BOWEL PROBLEMS (unusual diarrhea, constipation, pain near the anus) TENDERNESS IN MOUTH AND THROAT WITH OR WITHOUT PRESENCE OF ULCERS (sore throat, sores in mouth, or a toothache) UNUSUAL RASH, SWELLING OR PAIN  UNUSUAL VAGINAL DISCHARGE OR ITCHING   Items with * indicate a potential emergency and should be followed up as soon as possible or go to the Emergency Department if any problems should occur.  Please show the CHEMOTHERAPY ALERT CARD or IMMUNOTHERAPY ALERT CARD at check-in  to the Emergency Department and triage nurse.  Should you have questions after your visit or need to cancel or reschedule your appointment, please contact [facility]  Dept: [phone]  and follow the prompts.  Office hours are 8:00 a.m. to 4:30 p.m. Monday - Friday. Please note that voicemails left after 4:00 p.m. may not be returned until the following business day.  We are closed weekends and major holidays. You have access to a nurse at all times for urgent questions. Please call the main number to the clinic Dept: [phone] and follow the prompts.   For any non-urgent questions, you may also contact your provider using MyChart. We now offer e-Visits for anyone 29 and older to request care online for non-urgent symptoms. For details visit [email or web address].   Also download the MyChart app! Go to the app store, search "MyChart", open the app, select [facility], and log in with your MyChart username and password.  Due to Covid, a mask is required upon entering the hospital/clinic. If you do not have a mask, one will be given to you upon arrival. For doctor visits, patients may have 1 support person aged 58 or older with them. For treatment visits, patients cannot have anyone with them due to current Covid guidelines and our immunocompromised population.   Cisplatin injection What is this medication? CISPLATIN (SIS pla tin) is a chemotherapy drug. It targets fast dividing cells, like cancer cells, and causes these cells to die. This medicine is used to treat many types of cancer like bladder, ovarian, and testicular cancers. This medicine may be  used for other purposes; ask your health care provider or pharmacist if you have questions. COMMON BRAND NAME(S): Platinol, Platinol -AQ What should I tell my care team before I take this medication? They need to know if you have any of these conditions: eye disease, vision problems hearing problems kidney  disease low blood counts, like white cells, platelets, or red blood cells tingling of the fingers or toes, or other nerve disorder an unusual or allergic reaction to cisplatin, carboplatin, oxaliplatin, other medicines, foods, dyes, or preservatives pregnant or trying to get pregnant breast-feeding How should I use this medication? This drug is given as an infusion into a vein. It is administered in a hospital or clinic by a specially trained health care professional. Talk to your pediatrician regarding the use of this medicine in children. Special care may be needed. Overdosage: If you think you have taken too much of this medicine contact a poison control center or emergency room at once. NOTE: This medicine is only for you. Do not share this medicine with others. What if I miss a dose? It is important not to miss a dose. Call your doctor or health care professional if you are unable to keep an appointment. What may interact with this medication? This medicine may interact with the following medications: foscarnet certain antibiotics like amikacin, gentamicin, neomycin, polymyxin B, streptomycin, tobramycin, vancomycin This list may not describe all possible interactions. Give your health care provider a list of all the medicines, herbs, non-prescription drugs, or dietary supplements you use. Also tell them if you smoke, drink alcohol, or use illegal drugs. Some items may interact with your medicine. What should I watch for while using this medication? Your condition will be monitored carefully while you are receiving this medicine. You will need important blood work done while you are taking this medicine. This drug may make you feel generally unwell. This is not uncommon, as chemotherapy can affect healthy cells as well as cancer cells. Report any side effects. Continue your course of treatment even though you feel ill unless your doctor tells you to stop. This medicine may increase your  risk of getting an infection. Call your healthcare professional for advice if you get a fever, chills, or sore throat, or other symptoms of a cold or flu. Do not treat yourself. Try to avoid being around people who are sick. Avoid taking medicines that contain aspirin, acetaminophen, ibuprofen, naproxen, or ketoprofen unless instructed by your healthcare professional. These medicines may hide a fever. This medicine may increase your risk to bruise or bleed. Call your doctor or health care professional if you notice any unusual bleeding. Be careful brushing and flossing your teeth or using a toothpick because you may get an infection or bleed more easily. If you have any dental work done, tell your dentist you are receiving this medicine. Do not become pregnant while taking this medicine or for 14 months after stopping it. Women should inform their healthcare professional if they wish to become pregnant or think they might be pregnant. Men should not father a child while taking this medicine and for 11 months after stopping it. There is potential for serious side effects to an unborn child. Talk to your healthcare professional for more information. Do not breast-feed an infant while taking this medicine. This medicine has caused ovarian failure in some women. This medicine may make it more difficult to get pregnant. Talk to your healthcare professional if you are concerned about your fertility. This medicine has  caused decreased sperm counts in some men. This may make it more difficult to father a child. Talk to your healthcare professional if you are concerned about your fertility. Drink fluids as directed while you are taking this medicine. This will help protect your kidneys. Call your doctor or health care professional if you get diarrhea. Do not treat yourself. What side effects may I notice from receiving this medication? Side effects that you should report to your doctor or health care professional  as soon as possible: allergic reactions like skin rash, itching or hives, swelling of the face, lips, or tongue blurred vision changes in vision decreased hearing or ringing of the ears nausea, vomiting pain, redness, or irritation at site where injected pain, tingling, numbness in the hands or feet signs and symptoms of bleeding such as bloody or black, tarry stools; red or dark brown urine; spitting up blood or brown material that looks like coffee grounds; red spots on the skin; unusual bruising or bleeding from the eyes, gums, or nose signs and symptoms of infection like fever; chills; cough; sore throat; pain or trouble passing urine signs and symptoms of kidney injury like trouble passing urine or change in the amount of urine signs and symptoms of low red blood cells or anemia such as unusually weak or tired; feeling faint or lightheaded; falls; breathing problems Side effects that usually do not require medical attention (report to your doctor or health care professional if they continue or are bothersome): loss of appetite mouth sores muscle cramps This list may not describe all possible side effects. Call your doctor for medical advice about side effects. You may report side effects to FDA at 1-800-FDA-1088. Where should I keep my medication? This drug is given in a hospital or clinic and will not be stored at home. NOTE: This sheet is a summary. It may not cover all possible information. If you have questions about this medicine, talk to your doctor, pharmacist, or health care provider.  2022 [employer] (2021-05-29 00:00:00)

## 2021-08-24 NOTE — Assessment & Plan Note (Signed)
She will continue pain medicine taper as tolerated I have refilled her prescription pain medicine today

## 2021-08-24 NOTE — Progress Notes (Signed)
[facility] PROGRESS NOTE  Patient Care Team: Hamrick, Lorin Mercy, MD as PCP - General (Family Medicine) Heath Lark, MD as Consulting Physician (Hematology and Oncology)  ASSESSMENT & PLAN:  Laryngeal squamous cell carcinoma ([place]) So far, she tolerated treatment well except for excessive fatigue  We discussed treatment with 2 weeks on 1 week off approach to allow blood count recovery I recommend minimum 3 months of treatment before repeating MRI of the heart for evaluation of response rate and she is in agreement In the meantime, she will continue anticoagulation therapy indefinitely  Cancer associated pain She will continue pain medicine taper as tolerated I have refilled her prescription pain medicine today  No orders of the defined types were placed in this encounter.   All questions were answered. The patient knows to call the clinic with any problems, questions or concerns. The total time spent in the appointment was 20 minutes encounter with patients including review of chart and various tests results, discussions about plan of care and coordination of care plan   Heath Lark, MD 08/24/2021 9:09 AM  INTERVAL HISTORY: Please see below for problem oriented charting. she returns for treatment follow-up with her son She communicated through handwriting She complained of fatigue from the last treatment No nausea Denies peripheral neuropathy Denies bleeding complications from anticoagulation therapy  REVIEW OF SYSTEMS:   Constitutional: Denies fevers, chills or abnormal weight loss Eyes: Denies blurriness of vision Ears, nose, mouth, throat, and face: Denies mucositis or sore throat Respiratory: Denies cough, dyspnea or wheezes Cardiovascular: Denies palpitation, chest discomfort or lower extremity swelling Gastrointestinal:  Denies nausea, heartburn or change in bowel habits Skin: Denies abnormal skin rashes Lymphatics: Denies new lymphadenopathy or easy  bruising Neurological:Denies numbness, tingling or new weaknesses Behavioral/Psych: Mood is stable, no new changes  All other systems were reviewed with the patient and are negative.  I have reviewed the past medical history, past surgical history, social history and family history with the patient and they are unchanged from previous note.  ALLERGIES:  has No Known Allergies.  MEDICATIONS:  Current Outpatient Medications  Medication Sig Dispense Refill   apixaban (ELIQUIS) 2.5 MG TABS tablet Take 1 tablet (2.5 mg total) by mouth 2 (two) times daily. 60 tablet 3   Cholecalciferol (VITAMIN D3) 125 MCG (5000 UT) CAPS Take 5,000 Units by mouth daily.     HYDROcodone-acetaminophen (NORCO/VICODIN) 5-325 MG tablet TAKE 1 TABLET BY MOUTH EVERY 6 HOURS AS NEEDED FOR MODERATE PAIN 60 tablet 0   levothyroxine (SYNTHROID) 137 MCG tablet Take 137 mcg by mouth daily before breakfast.     lidocaine-prilocaine (EMLA) cream Apply topically daily as needed. 30 g 9   Nutritional Supplements (KATE FARMS PEPTIDE 1.5) LIQD Take 4 Bottles by mouth daily. 325 mL 11   ondansetron (ZOFRAN) 8 MG tablet Take 1 tablet (8 mg total) by mouth every 8 (eight) hours as needed for nausea. 30 tablet 3   oxymetazoline (AFRIN) 0.05 % nasal spray Place 3 sprays into both nostrils 2 (two) times daily as needed (Bleeding from stoma). 30 mL 0   prochlorperazine (COMPAZINE) 10 MG tablet Take 1 tablet (10 mg total) by mouth every 6 (six) hours as needed for nausea or vomiting. 30 tablet 1   No current facility-administered medications for this visit.    SUMMARY OF ONCOLOGIC HISTORY: Oncology History  Laryngeal squamous cell carcinoma ([place])  12/02/2018 Imaging   CT neck: IMPRESSION: 1. Supraglottic laryngeal carcinoma extending across the anterior commissure  and invading the prelaryngeal fat, left aryepiglottic fold and left aspect of the epiglottis. 2. Severe narrowing of the laryngeal airway. 3. Bilateral subcentimeter level  2A and 2A cervical lymph nodes.     12/02/2018 Imaging   CT  Chest:  IMPRESSION: No evidence of metastatic disease in the chest.   12/03/2018 Pathology Results   (Care Everywhere; [facility]) A: Larynx, supraglottis, biopsy - Atypical squamous proliferation with at least low grade dysplasia and abundant keratinization - No definite high grade dysplasia or invasive carcinoma represented (see comment)   12/18/2018 Pathology Results   (Care Everywhere; [facility]) A:  Neck, left, fine needle aspiration - Rare single atypical cells, no cells diagnostic of malignancy - Lymph node material present  B:  Neck, right, fine needle aspiration - Non-diagnostic specimen - No lymph node material identified - Mixed inflammation and blood   12/31/2018 Pathology Results   (Care Everywhere; [facility])  A: Larynx, left supraglottic mass, biopsy - Atypical squamous proliferation with abundant keratinization, suspicious for well-differentiated invasive squamous cell carcinoma  B: Larynx, left supraglottic mass, biopsy - Well-differentiated invasive squamous cell carcinoma with abundant keratinization (see comment)   01/18/2019 Initial Diagnosis   Laryngeal cancer ([address])   01/26/2019 Imaging   1. Locally advanced hypermetabolic laryngeal neoplasm centered in the left supraglottic space with bilateral glottic and epiglottic involvement and subglottic extension on the left. 2. Hypermetabolic bilateral level 3 neck nodal metastases. 3. Hypermetabolic high mediastinal nodal metastases between the trachea and upper thoracic esophagus. 4. Otherwise no distant hypermetabolic metastatic disease. 5. Nodular 7 mm focus at the left lung base, below PET resolution, recommend attention on follow-up chest CT in 3 months. 6. Chronic findings include: Aortic Atherosclerosis (ICD10-I70.0). Cholelithiasis.   02/08/2019 Cancer Staging   Staging form: Larynx - Supraglottis, AJCC 8th Edition - Clinical stage from  02/08/2019: Stage IVA (cT3, cN2c, cM0) - Signed by Eppie Gibson, MD on 02/08/2019    02/11/2019 - 04/07/2019 Chemotherapy   The patient weekly cisplatin for chemotherapy treatment.     07/20/2019 Pathology Results   PROCEDURE: Total glossectomy, total pharyngectomy, total laryngectomy, total thyroidectomy, and cervical esophagectomy TUMOR SITE: Larynx TUMOR LATERALITY: Not specified TUMOR FOCALITY: Unifocal TUMOR SIZE:    GREATEST DIMENSION: 6.9 cm    ADDITIONAL DIMENSIONS: 5.4 x 2.4 cm HISTOLOGIC TYPE: Squamous cell carcinoma, conventional (keratinizing) HISTOLOGIC GRADE:  G1-G2: Well to moderately differentiated TUMOR EXTENSION: Tumor involves the posterior tongue, pharynx, larynx, tracheal cartilage, hyoid bone, cervical esophagus, vagus nerve, carotid artery, deep cervical fascia, ans surrounding skeletal muscle and soft tissue MARGINS: Uninvolved by invasive tumor; Perineural invasion is less than 6mm from the left soft tissue margin LYMPHOVASCULAR INVASION: Not identified PERINEURAL INVASION:  Present REGIONAL LYMPH NODES:     NUMBER OF LYMPH NODES INVOLVED: 2     NUMBER OF LYMPH NODES EXAMINED: 24     LATERALITY OF LYMPH NODES INVOLVED: Cannot determine     SIZE OF LARGEST METASTATIC DEPOSIT: 0.8 cm     EXTRANODAL EXTENSION: Not identified PATHOLOGIC STAGE CLASSIFICATION (pTNM, AJCC 8TH Ed): (r)(y) pT4b pN2a DISTANT METASTASIS (pM): ADDITIONAL PATHOLOGIC FINDINGS: Previous chemoradiation   11/04/2019 Imaging   PET: 1.  Hypermetabolic bilateral neck lesions as detailed above are concerning for disease recurrence. 2.  Compared to 07/08/2019 PET scan, interval development of subcentimeter bilateral upper lobe pulmonary nodules, size below PET resolution. These are concerning for metastasis. Consider diagnostic quality CT chest exam for further evaluation as per oncology treatment planning. 3.  Focal hypermetabolic uptake  in right ventricular cavity of indeterminate  significance. Recommend echocardiographic examination to rule out underlying thrombus. This finding was discussed with Dr. Conley Canal by Dr. Clementeen Graham and Dr. Marcello Moores on 11/04/2019 at approximately 3:00 PM via telephone. 4.  Ancillary CT findings as above.   11/05/2019 Imaging   Echocardiogram: Summary   1. The left ventricle is normal in size with normal wall thickness.   2. The left ventricular systolic function is normal, LVEF is visually estimated at 60-65%.   3. The right ventricle is normal in size, with normal systolic function.   4. There is an extrinsic compression of the RV and RA laterally at the AV groove by a mass of 4.3 cm diameter. Unclear if the mass is invasive, although no pericardial effusion seen suggestive no invasion possibly. There is no evidence of hemodynamic compromise secondary to cardiac compression with normal IVC diameter.   12/06/2019 Pathology Results   A. LYMPH NODE, LEFT SUBMANDIBULAR, BIOPSY:  - Squamous cell carcinoma.  - No distinct nodal tissue identified.    12/06/2019 Procedure   Successful ultrasound-guided core biopsy of centrally necrotic left submandibular lymph node. Of note, due to extensive prior postsurgical and post radiation changes, patient has very limited mobility in the neck and the biopsy was extremely challenging    12/16/2019 -  Chemotherapy   The patient had pembrolizumab for chemotherapy treatment.     03/08/2020 PET scan   1. Diminished size of masses in the neck as described. Persistent increased metabolic activity particularly in the LEFT neck. Some of this diminished size on the LEFT could be due to decompression of necrotic material. Correlate with any symptoms of inflammation or infection in this area as well given the paucity of residual soft tissue associated with the above abnormality, overall findings are compatible with residual disease in this location and there is no significant surrounding stranding to indicate inflammation  that would correlate with the degree of FDG uptake remaining. 2. Diminished activity in the juxta cardiac mass with peripheral activity suggesting interval necrosis, on the prior study this was uniformly hypermetabolic. There is still considerable FDG uptake. 3. No signs of disease in the abdomen or pelvis. 4. 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 Correlate with any new pain or symptoms in this area with further imaging as warranted. No discrete mass or bone destruction on today's study.   06/08/2020 Imaging   CT neck 1. Status post laryngectomy and neck dissection without lymphadenopathy or mass lesion. 2. Short segment severe stenosis of the left internal carotid artery adjacent to a surgical clip.     06/08/2020 Imaging   CT chest 1. There is a large, hypodense lesion within, or very closely abutting and likely intruding upon the right ventricle, as seen on prior examination and approximately 4.6 x 4.3 cm, poorly evaluated due to cardiac motion. Findings are consistent with metastatic lesion and/or thrombus and poorly assessed by ungated CT. Consider echocardiography and cardiac MRI for further assessment.   2. Multiple small pulmonary nodules in the bilateral lung apices are unchanged. Attention on follow-up.   3.  No evidence of new metastatic disease in the chest.   4.  Aortic Atherosclerosis (ICD10-I70.0).     09/07/2020 Imaging   1. Mild decrease in size of juxta cardiac metastatic involving the right ventricle. 2. Small pulmonary nodules are unchanged from previous exam. 3. Aortic atherosclerosis. 4. Gallstone.     12/14/2020 Procedure   Successful exchange and up size from 20 French pull-through gastrostomy to 22  French balloon retention gastrostomy.   03/30/2021 Imaging   Ct neck 1.  In comparison with PET/CT from February 2021, marked interval decrease in size of soft tissue mass along the angle of the left mandible consistent with a positive treatment response.  However, there are new destructive bony changes along the lingual surface of the mandible in this region with appearance of soft tissue infiltrating the marrow cavity. While concerning for neoplastic involvement, changes could also represent other nonspecific osteonecrosis including infection or sequela of prior radiation treatment. Recommend correlation with direct visual inspection. Further evaluation with MRI may be of value.  2.  Centrally necrotic 1.3 cm right level IIa lymph node corresponding with previously described hypermetabolic lesion, decreased in size from prior. No suspicious new or enlarged lymph nodes in the neck.  3.  Enlarged right lower paratracheal lymph node suspected in the mediastinum, incompletely imaged. Consider dedicated CT imaging of the chest.  4.  Scattered pulmonary nodules in the imaged lung apices, the largest a 9 mm lesion in the right upper lobe. Recommend comparison with outside imaging ofthe chest if available.   05/03/2021 Echocardiogram    1. Left ventricular ejection fraction, by estimation, is 55 to 60%. The left ventricle has normal function. The left ventricle has no regional wall motion abnormalities. There is mild left ventricular hypertrophy. Left ventricular diastolic parameters are consistent with Grade I diastolic dysfunction (impaired relaxation).  2. Prominent echogenic structure on the RV free wall, possibly attached to the TV - measuring about 2.0 cm in diameter and 3.0 cm in length - some mobile filamentous material is attached, suggestive of thrombus or less likely tumor.. Right ventricular systolic function is normal. The right ventricular size is normal.  3. The mitral valve is grossly normal. Trivial mitral valve regurgitation.  4. The aortic valve is tricuspid. Aortic valve regurgitation is not visualized.     05/04/2021 Imaging   RIGHT: - There is no evidence of deep vein thrombosis in the lower extremity.   - No cystic structure found in the  popliteal fossa.   LEFT: - There is no evidence of deep vein thrombosis in the lower extremity.   - No cystic structure found in the popliteal fossa.     07/12/2021 Imaging   1. A large, heterogeneous mass about and within the right ventricle is difficult to clearly assess due to cardiac motion and mixing artifact on this non tailored examination, but suspect it has significantly increased in size, and now involves the right atrium in addition to the right ventricle and pulmonary outflow tract, measuring approximately 6.9 x 4.9 cm, previously 4.5 x 4.2 cm when measured similarly. This may reflect a combination of metastatic soft tissue and thrombus, and could be more clearly assessed by echocardiography and cardiac MRI. 2. No significant change of a right upper lobe pulmonary nodule, measuring 0.8 cm, which remains modestly suspicious for a metastasis. Other small nodules are likewise stable. Attention on follow-up. 3. Status post laryngectomy and tracheostomy. 4. Coronary artery disease.   07/24/2021 Echocardiogram    1. There is an echogenic RV and RVOT mass. Attachment appears to be on the right ventricular mid wall. Mass is 32 by 24 mm, though may be underestimated. Right ventricular systolic function is normal. The right ventricular size is normal. There is normal pulmonary artery systolic pressure.  2. Left ventricular ejection fraction, by estimation, is 60 to 65%. The left ventricle has normal function. The left ventricle has no regional wall motion abnormalities. Left  ventricular diastolic parameters are consistent with Grade I diastolic dysfunction (impaired relaxation). The average left ventricular global longitudinal strain is -19.0 %. The global longitudinal strain is normal.  3. The mitral valve is normal in structure. No evidence of mitral valve regurgitation.  4. The aortic valve is tricuspid. Aortic valve regurgitation is not visualized. No aortic stenosis is present.    Comparison(s): A prior study was performed on 05/03/21. Prior images reviewed side by side. Similar size of RV mass to slight increase. Do not see right atrial involvement in this study.   Conclusion(s)/Recommendation(s): If concerned for tumor vs thombus, cardiac MRI could be considered   08/02/2021 Imaging   1. Large RV mass measuring 17mm x 28mm x 24mm. Mass is isointense to myocardium on T1 weighted imaging, does not suppress with fat saturation, isointense on T2 weighted imaging, perfuses on first pass perfusion, and heterogenous enhancement on LGE imaging. This is consistent with tumor, and in setting of known metastatic laryngeal cancer, is consistent with metastasis. There does appear to be thrombus adherent to tumor   2. Normal LV size and systolic function (EF 76%). No late gadolinium enhancement to suggest myocardial scar   3.  Normal RV size with mild systolic dysfunction (EF 14%)   08/17/2021 -  Chemotherapy   Patient is on Treatment Plan : HEAD/NECK Cisplatin q7d       PHYSICAL EXAMINATION: ECOG PERFORMANCE STATUS: 1 - Symptomatic but completely ambulatory  Vitals:   08/24/21 0818  BP: 110/77  Pulse: 82  Resp: 18  Temp: 97.7 F (36.5 C)  SpO2: 100%   Filed Weights   08/24/21 0818  Weight: 144 lb 6.4 oz (65.5 kg)    GENERAL:alert, no distress and comfortable NEURO: alert & oriented x 3 with tracheostomy in situ no focal motor/sensory deficits  LABORATORY DATA:  I have reviewed the data as listed    Component Value Date/Time   NA 138 08/24/2021 0753   K 4.1 08/24/2021 0753   CL 103 08/24/2021 0753   CO2 22 08/24/2021 0753   GLUCOSE 101 (H) 08/24/2021 0753   BUN 20 08/24/2021 0753   CREATININE 0.77 08/24/2021 0753   CALCIUM 8.9 08/24/2021 0753   PROT 7.4 08/14/2021 0905   ALBUMIN 3.9 08/14/2021 0905   AST 19 08/14/2021 0905   ALT 16 08/14/2021 0905   ALKPHOS 72 08/14/2021 0905   BILITOT 0.3 08/14/2021 0905   GFRNONAA >60 08/24/2021 0753   GFRAA >60  06/08/2020 1303   GFRAA >60 02/17/2020 1128    No results found for: SPEP, UPEP  Lab Results  Component Value Date   WBC 4.3 08/24/2021   NEUTROABS 2.6 08/24/2021   HGB 13.5 08/24/2021   HCT 39.3 08/24/2021   MCV 84.7 08/24/2021   PLT 231 08/24/2021      Chemistry      Component Value Date/Time   NA 138 08/24/2021 0753   K 4.1 08/24/2021 0753   CL 103 08/24/2021 0753   CO2 22 08/24/2021 0753   BUN 20 08/24/2021 0753   CREATININE 0.77 08/24/2021 0753      Component Value Date/Time   CALCIUM 8.9 08/24/2021 0753   ALKPHOS 72 08/14/2021 0905   AST 19 08/14/2021 0905   ALT 16 08/14/2021 0905   BILITOT 0.3 08/14/2021 0905

## 2021-08-24 NOTE — Assessment & Plan Note (Signed)
So far, she tolerated treatment well except for excessive fatigue  We discussed treatment with 2 weeks on 1 week off approach to allow blood count recovery I recommend minimum 3 months of treatment before repeating MRI of the heart for evaluation of response rate and she is in agreement In the meantime, she will continue anticoagulation therapy indefinitely

## 2021-09-06 MED FILL — Fosaprepitant Dimeglumine For IV Infusion 150 MG (Base Eq): INTRAVENOUS | Qty: 5 | Status: AC

## 2021-09-06 MED FILL — Dexamethasone Sodium Phosphate Inj 100 MG/10ML: INTRAMUSCULAR | Qty: 1 | Status: AC

## 2021-09-07 ENCOUNTER — Encounter: Payer: Self-pay | Admitting: Hematology and Oncology

## 2021-09-07 ENCOUNTER — Inpatient Hospital Stay: Payer: Medicare HMO

## 2021-09-07 ENCOUNTER — Inpatient Hospital Stay (HOSPITAL_BASED_OUTPATIENT_CLINIC_OR_DEPARTMENT_OTHER): Payer: Medicare HMO | Admitting: Hematology and Oncology

## 2021-09-07 ENCOUNTER — Other Ambulatory Visit: Payer: Self-pay

## 2021-09-07 DIAGNOSIS — Z79899 Other long term (current) drug therapy: Secondary | ICD-10-CM | POA: Diagnosis not present

## 2021-09-07 DIAGNOSIS — Z7901 Long term (current) use of anticoagulants: Secondary | ICD-10-CM | POA: Diagnosis not present

## 2021-09-07 DIAGNOSIS — C7989 Secondary malignant neoplasm of other specified sites: Secondary | ICD-10-CM | POA: Diagnosis not present

## 2021-09-07 DIAGNOSIS — T451X5A Adverse effect of antineoplastic and immunosuppressive drugs, initial encounter: Secondary | ICD-10-CM | POA: Diagnosis not present

## 2021-09-07 DIAGNOSIS — G893 Neoplasm related pain (acute) (chronic): Secondary | ICD-10-CM

## 2021-09-07 DIAGNOSIS — C329 Malignant neoplasm of larynx, unspecified: Secondary | ICD-10-CM

## 2021-09-07 DIAGNOSIS — Z5111 Encounter for antineoplastic chemotherapy: Secondary | ICD-10-CM | POA: Diagnosis not present

## 2021-09-07 DIAGNOSIS — D701 Agranulocytosis secondary to cancer chemotherapy: Secondary | ICD-10-CM | POA: Diagnosis not present

## 2021-09-07 DIAGNOSIS — C321 Malignant neoplasm of supraglottis: Secondary | ICD-10-CM | POA: Diagnosis not present

## 2021-09-07 DIAGNOSIS — Z95828 Presence of other vascular implants and grafts: Secondary | ICD-10-CM

## 2021-09-07 LAB — BASIC METABOLIC PANEL - CANCER CENTER ONLY
Anion gap: 11 (ref 5–15)
BUN: 24 mg/dL — ABNORMAL HIGH (ref 8–23)
CO2: 24 mmol/L (ref 22–32)
Calcium: 8.8 mg/dL — ABNORMAL LOW (ref 8.9–10.3)
Chloride: 105 mmol/L (ref 98–111)
Creatinine: 0.76 mg/dL (ref 0.44–1.00)
GFR, Estimated: >60 mL/min (ref >60)
Glucose, Bld: 95 mg/dL (ref 70–99)
Potassium: 4.1 mmol/L (ref 3.5–5.1)
Sodium: 140 mmol/L (ref 135–145)

## 2021-09-07 LAB — CBC WITH DIFFERENTIAL (CANCER CENTER ONLY)
Abs Immature Granulocytes: 0 10*3/uL (ref 0.00–0.07)
Basophils Absolute: 0 10*3/uL (ref 0.0–0.1)
Basophils Relative: 0 %
Eosinophils Absolute: 0.1 10*3/uL (ref 0.0–0.5)
Eosinophils Relative: 3 %
HCT: 37.7 % (ref 36.0–46.0)
Hemoglobin: 12.5 g/dL (ref 12.0–15.0)
Immature Granulocytes: 0 %
Lymphocytes Relative: 30 %
Lymphs Abs: 1 10*3/uL (ref 0.7–4.0)
MCH: 28.6 pg (ref 26.0–34.0)
MCHC: 33.2 g/dL (ref 30.0–36.0)
MCV: 86.3 fL (ref 80.0–100.0)
Monocytes Absolute: 0.4 10*3/uL (ref 0.1–1.0)
Monocytes Relative: 12 %
Neutro Abs: 1.8 10*3/uL (ref 1.7–7.7)
Neutrophils Relative %: 55 %
Platelet Count: 200 10*3/uL (ref 150–400)
RBC: 4.37 MIL/uL (ref 3.87–5.11)
RDW: 14 % (ref 11.5–15.5)
WBC Count: 3.3 10*3/uL — ABNORMAL LOW (ref 4.0–10.5)
nRBC: 0 % (ref 0.0–0.2)

## 2021-09-07 LAB — MAGNESIUM: Magnesium: 2.2 mg/dL (ref 1.7–2.4)

## 2021-09-07 MED ORDER — SODIUM CHLORIDE 0.9% FLUSH
10.0000 mL | INTRAVENOUS | Status: DC | PRN
  Administered 2021-09-07: 10 mL

## 2021-09-07 MED ORDER — SODIUM CHLORIDE 0.9% FLUSH
10.0000 mL | Freq: Once | INTRAVENOUS | Status: AC
  Administered 2021-09-07: 10 mL

## 2021-09-07 MED ORDER — MAGNESIUM SULFATE 2 GM/50ML IV SOLN
2.0000 g | Freq: Once | INTRAVENOUS | Status: AC
  Administered 2021-09-07: 2 g via INTRAVENOUS
  Filled 2021-09-07: qty 50

## 2021-09-07 MED ORDER — PALONOSETRON HCL INJECTION 0.25 MG/5ML
0.2500 mg | Freq: Once | INTRAVENOUS | Status: AC
  Administered 2021-09-07: 0.25 mg via INTRAVENOUS
  Filled 2021-09-07: qty 5

## 2021-09-07 MED ORDER — SODIUM CHLORIDE 0.9 % IV SOLN
150.0000 mg | Freq: Once | INTRAVENOUS | Status: AC
  Administered 2021-09-07: 150 mg via INTRAVENOUS
  Filled 2021-09-07: qty 150

## 2021-09-07 MED ORDER — SODIUM CHLORIDE 0.9 % IV SOLN
40.0000 mg/m2 | Freq: Once | INTRAVENOUS | Status: AC
  Administered 2021-09-07: 68 mg via INTRAVENOUS
  Filled 2021-09-07: qty 68

## 2021-09-07 MED ORDER — POTASSIUM CHLORIDE IN NACL 20-0.9 MEQ/L-% IV SOLN
Freq: Once | INTRAVENOUS | Status: AC
  Filled 2021-09-07: qty 1000

## 2021-09-07 MED ORDER — SODIUM CHLORIDE 0.9 % IV SOLN: Freq: Once | INTRAVENOUS | Status: AC

## 2021-09-07 MED ORDER — HEPARIN SOD (PORK) LOCK FLUSH 100 UNIT/ML IV SOLN
500.0000 [IU] | Freq: Once | INTRAVENOUS | Status: AC | PRN
  Administered 2021-09-07: 500 [IU]

## 2021-09-07 MED ORDER — SODIUM CHLORIDE 0.9 % IV SOLN
10.0000 mg | Freq: Once | INTRAVENOUS | Status: AC
  Administered 2021-09-07: 10 mg via INTRAVENOUS
  Filled 2021-09-07: qty 10

## 2021-09-07 NOTE — Assessment & Plan Note (Signed)
She will continue pain medicine taper as tolerated I have refilled her prescription pain medicine today

## 2021-09-07 NOTE — Patient Instructions (Signed)
[facility]   Discharge Instructions: Thank you for choosing [facility] to provide your oncology and hematology care.   If you have a lab appointment with the [facility], please go directly to the [facility] and check in at the registration area.   Wear comfortable clothing and clothing appropriate for easy access to any Portacath or PICC line.   We strive to give you quality time with your provider. You may need to reschedule your appointment if you arrive late (15 or more minutes).  Arriving late affects you and other patients whose appointments are after yours.  Also, if you miss three or more appointments without notifying the office, you may be dismissed from the clinic at the providers discretion.      For prescription refill requests, have your pharmacy contact our office and allow 72 hours for refills to be completed.    Today you received the following chemotherapy and/or immunotherapy agents: cisplatin      To help prevent nausea and vomiting after your treatment, we encourage you to take your nausea medication as directed.  BELOW ARE SYMPTOMS THAT SHOULD BE REPORTED IMMEDIATELY: *FEVER GREATER THAN 100.4 F (38 C) OR HIGHER *CHILLS OR SWEATING *NAUSEA AND VOMITING THAT IS NOT CONTROLLED WITH YOUR NAUSEA MEDICATION *UNUSUAL SHORTNESS OF BREATH *UNUSUAL BRUISING OR BLEEDING *URINARY PROBLEMS (pain or burning when urinating, or frequent urination) *BOWEL PROBLEMS (unusual diarrhea, constipation, pain near the anus) TENDERNESS IN MOUTH AND THROAT WITH OR WITHOUT PRESENCE OF ULCERS (sore throat, sores in mouth, or a toothache) UNUSUAL RASH, SWELLING OR PAIN  UNUSUAL VAGINAL DISCHARGE OR ITCHING   Items with * indicate a potential emergency and should be followed up as soon as possible or go to the Emergency Department if any problems should occur.  Please show the CHEMOTHERAPY ALERT CARD or IMMUNOTHERAPY ALERT CARD at check-in  to the Emergency Department and triage nurse.  Should you have questions after your visit or need to cancel or reschedule your appointment, please contact [facility]  Dept: [phone]  and follow the prompts.  Office hours are 8:00 a.m. to 4:30 p.m. Monday - Friday. Please note that voicemails left after 4:00 p.m. may not be returned until the following business day.  We are closed weekends and major holidays. You have access to a nurse at all times for urgent questions. Please call the main number to the clinic Dept: [phone] and follow the prompts.   For any non-urgent questions, you may also contact your provider using MyChart. We now offer e-Visits for anyone 82 and older to request care online for non-urgent symptoms. For details visit [email or web address].   Also download the MyChart app! Go to the app store, search "MyChart", open the app, select [facility], and log in with your MyChart username and password.  Due to Covid, a mask is required upon entering the hospital/clinic. If you do not have a mask, one will be given to you upon arrival. For doctor visits, patients may have 1 support person aged 5 or older with them. For treatment visits, patients cannot have anyone with them due to current Covid guidelines and our immunocompromised population.   Cisplatin injection What is this medication? CISPLATIN (SIS pla tin) is a chemotherapy drug. It targets fast dividing cells, like cancer cells, and causes these cells to die. This medicine is used to treat many types of cancer like bladder, ovarian, and testicular cancers. This medicine may be  used for other purposes; ask your health care provider or pharmacist if you have questions. COMMON BRAND NAME(S): Platinol, Platinol -AQ What should I tell my care team before I take this medication? They need to know if you have any of these conditions: eye disease, vision problems hearing problems kidney  disease low blood counts, like white cells, platelets, or red blood cells tingling of the fingers or toes, or other nerve disorder an unusual or allergic reaction to cisplatin, carboplatin, oxaliplatin, other medicines, foods, dyes, or preservatives pregnant or trying to get pregnant breast-feeding How should I use this medication? This drug is given as an infusion into a vein. It is administered in a hospital or clinic by a specially trained health care professional. Talk to your pediatrician regarding the use of this medicine in children. Special care may be needed. Overdosage: If you think you have taken too much of this medicine contact a poison control center or emergency room at once. NOTE: This medicine is only for you. Do not share this medicine with others. What if I miss a dose? It is important not to miss a dose. Call your doctor or health care professional if you are unable to keep an appointment. What may interact with this medication? This medicine may interact with the following medications: foscarnet certain antibiotics like amikacin, gentamicin, neomycin, polymyxin B, streptomycin, tobramycin, vancomycin This list may not describe all possible interactions. Give your health care provider a list of all the medicines, herbs, non-prescription drugs, or dietary supplements you use. Also tell them if you smoke, drink alcohol, or use illegal drugs. Some items may interact with your medicine. What should I watch for while using this medication? Your condition will be monitored carefully while you are receiving this medicine. You will need important blood work done while you are taking this medicine. This drug may make you feel generally unwell. This is not uncommon, as chemotherapy can affect healthy cells as well as cancer cells. Report any side effects. Continue your course of treatment even though you feel ill unless your doctor tells you to stop. This medicine may increase your  risk of getting an infection. Call your healthcare professional for advice if you get a fever, chills, or sore throat, or other symptoms of a cold or flu. Do not treat yourself. Try to avoid being around people who are sick. Avoid taking medicines that contain aspirin, acetaminophen, ibuprofen, naproxen, or ketoprofen unless instructed by your healthcare professional. These medicines may hide a fever. This medicine may increase your risk to bruise or bleed. Call your doctor or health care professional if you notice any unusual bleeding. Be careful brushing and flossing your teeth or using a toothpick because you may get an infection or bleed more easily. If you have any dental work done, tell your dentist you are receiving this medicine. Do not become pregnant while taking this medicine or for 14 months after stopping it. Women should inform their healthcare professional if they wish to become pregnant or think they might be pregnant. Men should not father a child while taking this medicine and for 11 months after stopping it. There is potential for serious side effects to an unborn child. Talk to your healthcare professional for more information. Do not breast-feed an infant while taking this medicine. This medicine has caused ovarian failure in some women. This medicine may make it more difficult to get pregnant. Talk to your healthcare professional if you are concerned about your fertility. This medicine has  caused decreased sperm counts in some men. This may make it more difficult to father a child. Talk to your healthcare professional if you are concerned about your fertility. Drink fluids as directed while you are taking this medicine. This will help protect your kidneys. Call your doctor or health care professional if you get diarrhea. Do not treat yourself. What side effects may I notice from receiving this medication? Side effects that you should report to your doctor or health care professional  as soon as possible: allergic reactions like skin rash, itching or hives, swelling of the face, lips, or tongue blurred vision changes in vision decreased hearing or ringing of the ears nausea, vomiting pain, redness, or irritation at site where injected pain, tingling, numbness in the hands or feet signs and symptoms of bleeding such as bloody or black, tarry stools; red or dark brown urine; spitting up blood or brown material that looks like coffee grounds; red spots on the skin; unusual bruising or bleeding from the eyes, gums, or nose signs and symptoms of infection like fever; chills; cough; sore throat; pain or trouble passing urine signs and symptoms of kidney injury like trouble passing urine or change in the amount of urine signs and symptoms of low red blood cells or anemia such as unusually weak or tired; feeling faint or lightheaded; falls; breathing problems Side effects that usually do not require medical attention (report to your doctor or health care professional if they continue or are bothersome): loss of appetite mouth sores muscle cramps This list may not describe all possible side effects. Call your doctor for medical advice about side effects. You may report side effects to FDA at 1-800-FDA-1088. Where should I keep my medication? This drug is given in a hospital or clinic and will not be stored at home. NOTE: This sheet is a summary. It may not cover all possible information. If you have questions about this medicine, talk to your doctor, pharmacist, or health care provider.  2022 [employer] (2021-05-29 00:00:00)

## 2021-09-07 NOTE — Progress Notes (Signed)
[place] OFFICE PROGRESS NOTE  Patient Care Team: Hamrick, Lorin Mercy, MD as PCP - General (Family Medicine) Heath Lark, MD as Consulting Physician (Hematology and Oncology)  ASSESSMENT & PLAN:  Laryngeal squamous cell carcinoma ([place]) So far, she tolerated treatment well except for fatigue I recommend minimum 3 months of treatment before repeating MRI of the heart for evaluation of response rate and she is in agreement In the meantime, she will continue anticoagulation therapy indefinitely  Cancer associated pain She will continue pain medicine taper as tolerated I have refilled her prescription pain medicine today  Leukopenia due to antineoplastic chemotherapy [facility]) This is likely due to recent treatment. The patient denies recent history of fevers, cough, chills, diarrhea or dysuria. She is asymptomatic from the leukopenia. I will observe for now.  I will continue the chemotherapy at current dose without dosage adjustment.  If the leukopenia gets progressive worse in the future, I might have to delay her treatment or adjust the chemotherapy dose.    No orders of the defined types were placed in this encounter.   All questions were answered. The patient knows to call the clinic with any problems, questions or concerns. The total time spent in the appointment was 20 minutes encounter with patients including review of chart and various tests results, discussions about plan of care and coordination of care plan   Heath Lark, MD 09/07/2021 5:51 PM  INTERVAL HISTORY: Please see below for problem oriented charting. she returns for treatment follow-up on single agent cisplatin for recurrent head and neck cancer She tolerated last cycle of treatment well except for some fatigue Pain is well controlled Denies nausea or peripheral neuropathy  REVIEW OF SYSTEMS:   Constitutional: Denies fevers, chills or abnormal weight loss Eyes: Denies blurriness of vision Ears, nose,  mouth, throat, and face: Denies mucositis or sore throat Respiratory: Denies cough, dyspnea or wheezes Cardiovascular: Denies palpitation, chest discomfort or lower extremity swelling Gastrointestinal:  Denies nausea, heartburn or change in bowel habits Skin: Denies abnormal skin rashes Lymphatics: Denies new lymphadenopathy or easy bruising Neurological:Denies numbness, tingling or new weaknesses Behavioral/Psych: Mood is stable, no new changes  All other systems were reviewed with the patient and are negative.  I have reviewed the past medical history, past surgical history, social history and family history with the patient and they are unchanged from previous note.  ALLERGIES:  has No Known Allergies.  MEDICATIONS:  Current Outpatient Medications  Medication Sig Dispense Refill   apixaban (ELIQUIS) 2.5 MG TABS tablet Take 1 tablet (2.5 mg total) by mouth 2 (two) times daily. 60 tablet 3   apixaban (ELIQUIS) 2.5 MG TABS tablet Take 1 tablet (2.5 mg total) by mouth 2 (two) times daily. 60 tablet 3   Cholecalciferol (VITAMIN D3) 125 MCG (5000 UT) CAPS Take 5,000 Units by mouth daily.     HYDROcodone-acetaminophen (NORCO/VICODIN) 5-325 MG tablet TAKE 1 TABLET BY MOUTH EVERY 6 HOURS AS NEEDED FOR MODERATE PAIN 60 tablet 0   levothyroxine (SYNTHROID) 137 MCG tablet Take 137 mcg by mouth daily before breakfast.     lidocaine-prilocaine (EMLA) cream Apply topically daily as needed. 30 g 9   Nutritional Supplements (KATE FARMS PEPTIDE 1.5) LIQD Take 4 Bottles by mouth daily. 325 mL 11   ondansetron (ZOFRAN) 8 MG tablet Take 1 tablet (8 mg total) by mouth every 8 (eight) hours as needed for nausea. 30 tablet 3   oxymetazoline (AFRIN) 0.05 % nasal spray Place 3 sprays into both  nostrils 2 (two) times daily as needed (Bleeding from stoma). 30 mL 0   prochlorperazine (COMPAZINE) 10 MG tablet Take 1 tablet (10 mg total) by mouth every 6 (six) hours as needed for nausea or vomiting. 30 tablet 1   No  current facility-administered medications for this visit.   Facility-Administered Medications Ordered in Other Visits  Medication Dose Route Frequency Provider Last Rate Last Admin   sodium chloride flush (NS) 0.9 % injection 10 mL  10 mL Intracatheter PRN Alvy Bimler, Rogue Rafalski, MD   10 mL at 09/07/21 1550    SUMMARY OF ONCOLOGIC HISTORY: Oncology History  Laryngeal squamous cell carcinoma ([place])  12/02/2018 Imaging   CT neck: IMPRESSION: 1. Supraglottic laryngeal carcinoma extending across the anterior commissure and invading the prelaryngeal fat, left aryepiglottic fold and left aspect of the epiglottis. 2. Severe narrowing of the laryngeal airway. 3. Bilateral subcentimeter level 2A and 2A cervical lymph nodes.     12/02/2018 Imaging   CT  Chest:  IMPRESSION: No evidence of metastatic disease in the chest.   12/03/2018 Pathology Results   (Care Everywhere; [facility]) A: Larynx, supraglottis, biopsy - Atypical squamous proliferation with at least low grade dysplasia and abundant keratinization - No definite high grade dysplasia or invasive carcinoma represented (see comment)   12/18/2018 Pathology Results   (Care Everywhere; [facility]) A:  Neck, left, fine needle aspiration - Rare single atypical cells, no cells diagnostic of malignancy - Lymph node material present  B:  Neck, right, fine needle aspiration - Non-diagnostic specimen - No lymph node material identified - Mixed inflammation and blood   12/31/2018 Pathology Results   (Care Everywhere; [facility])  A: Larynx, left supraglottic mass, biopsy - Atypical squamous proliferation with abundant keratinization, suspicious for well-differentiated invasive squamous cell carcinoma  B: Larynx, left supraglottic mass, biopsy - Well-differentiated invasive squamous cell carcinoma with abundant keratinization (see comment)   01/18/2019 Initial Diagnosis   Laryngeal cancer ([place])   01/26/2019 Imaging   1. Locally advanced  hypermetabolic laryngeal neoplasm centered in the left supraglottic space with bilateral glottic and epiglottic involvement and subglottic extension on the left. 2. Hypermetabolic bilateral level 3 neck nodal metastases. 3. Hypermetabolic high mediastinal nodal metastases between the trachea and upper thoracic esophagus. 4. Otherwise no distant hypermetabolic metastatic disease. 5. Nodular 7 mm focus at the left lung base, below PET resolution, recommend attention on follow-up chest CT in 3 months. 6. Chronic findings include: Aortic Atherosclerosis (ICD10-I70.0). Cholelithiasis.   02/08/2019 Cancer Staging   Staging form: Larynx - Supraglottis, AJCC 8th Edition - Clinical stage from 02/08/2019: Stage IVA (cT3, cN2c, cM0) - Signed by Eppie Gibson, MD on 02/08/2019    02/11/2019 - 04/07/2019 Chemotherapy   The patient weekly cisplatin for chemotherapy treatment.     07/20/2019 Pathology Results   PROCEDURE: Total glossectomy, total pharyngectomy, total laryngectomy, total thyroidectomy, and cervical esophagectomy TUMOR SITE: Larynx TUMOR LATERALITY: Not specified TUMOR FOCALITY: Unifocal TUMOR SIZE:    GREATEST DIMENSION: 6.9 cm    ADDITIONAL DIMENSIONS: 5.4 x 2.4 cm HISTOLOGIC TYPE: Squamous cell carcinoma, conventional (keratinizing) HISTOLOGIC GRADE:  G1-G2: Well to moderately differentiated TUMOR EXTENSION: Tumor involves the posterior tongue, pharynx, larynx, tracheal cartilage, hyoid bone, cervical esophagus, vagus nerve, carotid artery, deep cervical fascia, ans surrounding skeletal muscle and soft tissue MARGINS: Uninvolved by invasive tumor; Perineural invasion is less than 28mm from the left soft tissue margin LYMPHOVASCULAR INVASION: Not identified PERINEURAL INVASION:  Present REGIONAL LYMPH NODES:     NUMBER OF LYMPH  NODES INVOLVED: 2     NUMBER OF LYMPH NODES EXAMINED: 24     LATERALITY OF LYMPH NODES INVOLVED: Cannot determine     SIZE OF LARGEST METASTATIC DEPOSIT:  0.8 cm     EXTRANODAL EXTENSION: Not identified PATHOLOGIC STAGE CLASSIFICATION (pTNM, AJCC 8TH Ed): (r)(y) pT4b pN2a DISTANT METASTASIS (pM): ADDITIONAL PATHOLOGIC FINDINGS: Previous chemoradiation   11/04/2019 Imaging   PET: 1.  Hypermetabolic bilateral neck lesions as detailed above are concerning for disease recurrence. 2.  Compared to 07/08/2019 PET scan, interval development of subcentimeter bilateral upper lobe pulmonary nodules, size below PET resolution. These are concerning for metastasis. Consider diagnostic quality CT chest exam for further evaluation as per oncology treatment planning. 3.  Focal hypermetabolic uptake in right ventricular cavity of indeterminate significance. Recommend echocardiographic examination to rule out underlying thrombus. This finding was discussed with Dr. Conley Canal by Dr. Clementeen Graham and Dr. Marcello Moores on 11/04/2019 at approximately 3:00 PM via telephone. 4.  Ancillary CT findings as above.   11/05/2019 Imaging   Echocardiogram: Summary   1. The left ventricle is normal in size with normal wall thickness.   2. The left ventricular systolic function is normal, LVEF is visually estimated at 60-65%.   3. The right ventricle is normal in size, with normal systolic function.   4. There is an extrinsic compression of the RV and RA laterally at the AV groove by a mass of 4.3 cm diameter. Unclear if the mass is invasive, although no pericardial effusion seen suggestive no invasion possibly. There is no evidence of hemodynamic compromise secondary to cardiac compression with normal IVC diameter.   12/06/2019 Pathology Results   A. LYMPH NODE, LEFT SUBMANDIBULAR, BIOPSY:  - Squamous cell carcinoma.  - No distinct nodal tissue identified.    12/06/2019 Procedure   Successful ultrasound-guided core biopsy of centrally necrotic left submandibular lymph node. Of note, due to extensive prior postsurgical and post radiation changes, patient has very limited mobility in the  neck and the biopsy was extremely challenging    12/16/2019 -  Chemotherapy   The patient had pembrolizumab for chemotherapy treatment.     03/08/2020 PET scan   1. Diminished size of masses in the neck as described. Persistent increased metabolic activity particularly in the LEFT neck. Some of this diminished size on the LEFT could be due to decompression of necrotic material. Correlate with any symptoms of inflammation or infection in this area as well given the paucity of residual soft tissue associated with the above abnormality, overall findings are compatible with residual disease in this location and there is no significant surrounding stranding to indicate inflammation that would correlate with the degree of FDG uptake remaining. 2. Diminished activity in the juxta cardiac mass with peripheral activity suggesting interval necrosis, on the prior study this was uniformly hypermetabolic. There is still considerable FDG uptake. 3. No signs of disease in the abdomen or pelvis. 4. 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 Correlate with any new pain or symptoms in this area with further imaging as warranted. No discrete mass or bone destruction on today's study.   06/08/2020 Imaging   CT neck 1. Status post laryngectomy and neck dissection without lymphadenopathy or mass lesion. 2. Short segment severe stenosis of the left internal carotid artery adjacent to a surgical clip.     06/08/2020 Imaging   CT chest 1. There is a large, hypodense lesion within, or very closely abutting and likely intruding upon the right ventricle, as seen on prior examination  and approximately 4.6 x 4.3 cm, poorly evaluated due to cardiac motion. Findings are consistent with metastatic lesion and/or thrombus and poorly assessed by ungated CT. Consider echocardiography and cardiac MRI for further assessment.   2. Multiple small pulmonary nodules in the bilateral lung apices are unchanged. Attention on follow-up.    3.  No evidence of new metastatic disease in the chest.   4.  Aortic Atherosclerosis (ICD10-I70.0).     09/07/2020 Imaging   1. Mild decrease in size of juxta cardiac metastatic involving the right ventricle. 2. Small pulmonary nodules are unchanged from previous exam. 3. Aortic atherosclerosis. 4. Gallstone.     12/14/2020 Procedure   Successful exchange and up size from 20 French pull-through gastrostomy to 22 French balloon retention gastrostomy.   03/30/2021 Imaging   Ct neck 1.  In comparison with PET/CT from February 2021, marked interval decrease in size of soft tissue mass along the angle of the left mandible consistent with a positive treatment response. However, there are new destructive bony changes along the lingual surface of the mandible in this region with appearance of soft tissue infiltrating the marrow cavity. While concerning for neoplastic involvement, changes could also represent other nonspecific osteonecrosis including infection or sequela of prior radiation treatment. Recommend correlation with direct visual inspection. Further evaluation with MRI may be of value.  2.  Centrally necrotic 1.3 cm right level IIa lymph node corresponding with previously described hypermetabolic lesion, decreased in size from prior. No suspicious new or enlarged lymph nodes in the neck.  3.  Enlarged right lower paratracheal lymph node suspected in the mediastinum, incompletely imaged. Consider dedicated CT imaging of the chest.  4.  Scattered pulmonary nodules in the imaged lung apices, the largest a 9 mm lesion in the right upper lobe. Recommend comparison with outside imaging ofthe chest if available.   05/03/2021 Echocardiogram    1. Left ventricular ejection fraction, by estimation, is 55 to 60%. The left ventricle has normal function. The left ventricle has no regional wall motion abnormalities. There is mild left ventricular hypertrophy. Left ventricular diastolic parameters are  consistent with Grade I diastolic dysfunction (impaired relaxation).  2. Prominent echogenic structure on the RV free wall, possibly attached to the TV - measuring about 2.0 cm in diameter and 3.0 cm in length - some mobile filamentous material is attached, suggestive of thrombus or less likely tumor.. Right ventricular systolic function is normal. The right ventricular size is normal.  3. The mitral valve is grossly normal. Trivial mitral valve regurgitation.  4. The aortic valve is tricuspid. Aortic valve regurgitation is not visualized.     05/04/2021 Imaging   RIGHT: - There is no evidence of deep vein thrombosis in the lower extremity.   - No cystic structure found in the popliteal fossa.   LEFT: - There is no evidence of deep vein thrombosis in the lower extremity.   - No cystic structure found in the popliteal fossa.     07/12/2021 Imaging   1. A large, heterogeneous mass about and within the right ventricle is difficult to clearly assess due to cardiac motion and mixing artifact on this non tailored examination, but suspect it has significantly increased in size, and now involves the right atrium in addition to the right ventricle and pulmonary outflow tract, measuring approximately 6.9 x 4.9 cm, previously 4.5 x 4.2 cm when measured similarly. This may reflect a combination of metastatic soft tissue and thrombus, and could be more clearly assessed by echocardiography  and cardiac MRI. 2. No significant change of a right upper lobe pulmonary nodule, measuring 0.8 cm, which remains modestly suspicious for a metastasis. Other small nodules are likewise stable. Attention on follow-up. 3. Status post laryngectomy and tracheostomy. 4. Coronary artery disease.   07/24/2021 Echocardiogram    1. There is an echogenic RV and RVOT mass. Attachment appears to be on the right ventricular mid wall. Mass is 32 by 24 mm, though may be underestimated. Right ventricular systolic function is normal. The  right ventricular size is normal. There is normal pulmonary artery systolic pressure.  2. Left ventricular ejection fraction, by estimation, is 60 to 65%. The left ventricle has normal function. The left ventricle has no regional wall motion abnormalities. Left ventricular diastolic parameters are consistent with Grade I diastolic dysfunction (impaired relaxation). The average left ventricular global longitudinal strain is -19.0 %. The global longitudinal strain is normal.  3. The mitral valve is normal in structure. No evidence of mitral valve regurgitation.  4. The aortic valve is tricuspid. Aortic valve regurgitation is not visualized. No aortic stenosis is present.   Comparison(s): A prior study was performed on 05/03/21. Prior images reviewed side by side. Similar size of RV mass to slight increase. Do not see right atrial involvement in this study.   Conclusion(s)/Recommendation(s): If concerned for tumor vs thombus, cardiac MRI could be considered   08/02/2021 Imaging   1. Large RV mass measuring 40mm x 26mm x 71mm. Mass is isointense to myocardium on T1 weighted imaging, does not suppress with fat saturation, isointense on T2 weighted imaging, perfuses on first pass perfusion, and heterogenous enhancement on LGE imaging. This is consistent with tumor, and in setting of known metastatic laryngeal cancer, is consistent with metastasis. There does appear to be thrombus adherent to tumor   2. Normal LV size and systolic function (EF 13%). No late gadolinium enhancement to suggest myocardial scar   3.  Normal RV size with mild systolic dysfunction (EF 24%)   08/17/2021 -  Chemotherapy   Patient is on Treatment Plan : HEAD/NECK Cisplatin q7d       PHYSICAL EXAMINATION: ECOG PERFORMANCE STATUS: 1 - Symptomatic but completely ambulatory  Vitals:   09/07/21 0855  BP: 112/79  Pulse: 84  Resp: 18  Temp: (!) 97.4 F (36.3 C)  SpO2: 98%   Filed Weights   09/07/21 0855  Weight: 144 lb 3.2  oz (65.4 kg)    GENERAL:alert, no distress and comfortable SKIN: skin color, texture, turgor are normal, no rashes or significant lesions EYES: normal, Conjunctiva are pink and non-injected, sclera clear OROPHARYNX:no exudate, no erythema and lips, buccal mucosa, and tongue normal.  No oropharyngeal infection NECK: Tracheostomy in situ LYMPH:  no palpable lymphadenopathy in the cervical, axillary or inguinal LUNGS: clear to auscultation and percussion with normal breathing effort HEART: regular rate & rhythm and no murmurs and no lower extremity edema ABDOMEN:abdomen soft, non-tender and normal bowel sounds.  Feeding tube site looks okay Musculoskeletal:no cyanosis of digits and no clubbing    LABORATORY DATA:  I have reviewed the data as listed    Component Value Date/Time   NA 140 09/07/2021 0842   K 4.1 09/07/2021 0842   CL 105 09/07/2021 0842   CO2 24 09/07/2021 0842   GLUCOSE 95 09/07/2021 0842   BUN 24 (H) 09/07/2021 0842   CREATININE 0.76 09/07/2021 0842   CALCIUM 8.8 (L) 09/07/2021 0842   PROT 7.4 08/14/2021 0905   ALBUMIN 3.9 08/14/2021 0905  AST 19 08/14/2021 0905   ALT 16 08/14/2021 0905   ALKPHOS 72 08/14/2021 0905   BILITOT 0.3 08/14/2021 0905   GFRNONAA >60 09/07/2021 0842   GFRAA >60 06/08/2020 1303   GFRAA >60 02/17/2020 1128    No results found for: SPEP, UPEP  Lab Results  Component Value Date   WBC 3.3 (L) 09/07/2021   NEUTROABS 1.8 09/07/2021   HGB 12.5 09/07/2021   HCT 37.7 09/07/2021   MCV 86.3 09/07/2021   PLT 200 09/07/2021      Chemistry      Component Value Date/Time   NA 140 09/07/2021 0842   K 4.1 09/07/2021 0842   CL 105 09/07/2021 0842   CO2 24 09/07/2021 0842   BUN 24 (H) 09/07/2021 0842   CREATININE 0.76 09/07/2021 0842      Component Value Date/Time   CALCIUM 8.8 (L) 09/07/2021 0842   ALKPHOS 72 08/14/2021 0905   AST 19 08/14/2021 0905   ALT 16 08/14/2021 0905   BILITOT 0.3 08/14/2021 0905

## 2021-09-07 NOTE — Assessment & Plan Note (Signed)
This is likely due to recent treatment. The patient denies recent history of fevers, cough, chills, diarrhea or dysuria. She is asymptomatic from the leukopenia. I will observe for now.  I will continue the chemotherapy at current dose without dosage adjustment.  If the leukopenia gets progressive worse in the future, I might have to delay her treatment or adjust the chemotherapy dose.   

## 2021-09-07 NOTE — Assessment & Plan Note (Signed)
So far, she tolerated treatment well except for fatigue I recommend minimum 3 months of treatment before repeating MRI of the heart for evaluation of response rate and she is in agreement In the meantime, she will continue anticoagulation therapy indefinitely

## 2021-09-10 DIAGNOSIS — R633 Feeding difficulties, unspecified: Secondary | ICD-10-CM | POA: Diagnosis not present

## 2021-09-10 DIAGNOSIS — C321 Malignant neoplasm of supraglottis: Secondary | ICD-10-CM | POA: Diagnosis not present

## 2021-09-11 ENCOUNTER — Other Ambulatory Visit: Payer: Self-pay | Admitting: Hematology and Oncology

## 2021-09-11 DIAGNOSIS — E039 Hypothyroidism, unspecified: Secondary | ICD-10-CM

## 2021-09-14 ENCOUNTER — Inpatient Hospital Stay: Payer: Medicare HMO | Admitting: Dietician

## 2021-09-14 ENCOUNTER — Other Ambulatory Visit (HOSPITAL_COMMUNITY): Payer: Self-pay

## 2021-09-14 ENCOUNTER — Inpatient Hospital Stay: Payer: Medicare HMO

## 2021-09-14 ENCOUNTER — Other Ambulatory Visit: Payer: Self-pay

## 2021-09-14 ENCOUNTER — Other Ambulatory Visit: Payer: Self-pay | Admitting: Hematology and Oncology

## 2021-09-14 VITALS — BP 128/80 | HR 99 | Temp 98.0°F | Resp 17 | Wt 141.0 lb

## 2021-09-14 DIAGNOSIS — E039 Hypothyroidism, unspecified: Secondary | ICD-10-CM

## 2021-09-14 DIAGNOSIS — G893 Neoplasm related pain (acute) (chronic): Secondary | ICD-10-CM | POA: Diagnosis not present

## 2021-09-14 DIAGNOSIS — T451X5A Adverse effect of antineoplastic and immunosuppressive drugs, initial encounter: Secondary | ICD-10-CM | POA: Diagnosis not present

## 2021-09-14 DIAGNOSIS — Z5111 Encounter for antineoplastic chemotherapy: Secondary | ICD-10-CM | POA: Diagnosis not present

## 2021-09-14 DIAGNOSIS — Z7901 Long term (current) use of anticoagulants: Secondary | ICD-10-CM | POA: Diagnosis not present

## 2021-09-14 DIAGNOSIS — Z95828 Presence of other vascular implants and grafts: Secondary | ICD-10-CM

## 2021-09-14 DIAGNOSIS — C329 Malignant neoplasm of larynx, unspecified: Secondary | ICD-10-CM

## 2021-09-14 DIAGNOSIS — C321 Malignant neoplasm of supraglottis: Secondary | ICD-10-CM

## 2021-09-14 DIAGNOSIS — D701 Agranulocytosis secondary to cancer chemotherapy: Secondary | ICD-10-CM | POA: Diagnosis not present

## 2021-09-14 DIAGNOSIS — C7989 Secondary malignant neoplasm of other specified sites: Secondary | ICD-10-CM | POA: Diagnosis not present

## 2021-09-14 DIAGNOSIS — Z79899 Other long term (current) drug therapy: Secondary | ICD-10-CM | POA: Diagnosis not present

## 2021-09-14 LAB — CBC WITH DIFFERENTIAL (CANCER CENTER ONLY)
Abs Immature Granulocytes: 0.01 10*3/uL (ref 0.00–0.07)
Basophils Absolute: 0 10*3/uL (ref 0.0–0.1)
Basophils Relative: 0 %
Eosinophils Absolute: 0.1 10*3/uL (ref 0.0–0.5)
Eosinophils Relative: 4 %
HCT: 36.8 % (ref 36.0–46.0)
Hemoglobin: 12.3 g/dL (ref 12.0–15.0)
Immature Granulocytes: 0 %
Lymphocytes Relative: 33 %
Lymphs Abs: 0.9 10*3/uL (ref 0.7–4.0)
MCH: 28.8 pg (ref 26.0–34.0)
MCHC: 33.4 g/dL (ref 30.0–36.0)
MCV: 86.2 fL (ref 80.0–100.0)
Monocytes Absolute: 0.4 10*3/uL (ref 0.1–1.0)
Monocytes Relative: 14 %
Neutro Abs: 1.4 10*3/uL — ABNORMAL LOW (ref 1.7–7.7)
Neutrophils Relative %: 49 %
Platelet Count: 144 10*3/uL — ABNORMAL LOW (ref 150–400)
RBC: 4.27 MIL/uL (ref 3.87–5.11)
RDW: 13.7 % (ref 11.5–15.5)
WBC Count: 2.8 10*3/uL — ABNORMAL LOW (ref 4.0–10.5)
nRBC: 0 % (ref 0.0–0.2)

## 2021-09-14 LAB — BASIC METABOLIC PANEL - CANCER CENTER ONLY
Anion gap: 11 (ref 5–15)
BUN: 20 mg/dL (ref 8–23)
CO2: 24 mmol/L (ref 22–32)
Calcium: 8.9 mg/dL (ref 8.9–10.3)
Chloride: 102 mmol/L (ref 98–111)
Creatinine: 0.79 mg/dL (ref 0.44–1.00)
GFR, Estimated: >60 mL/min (ref >60)
Glucose, Bld: 98 mg/dL (ref 70–99)
Potassium: 4 mmol/L (ref 3.5–5.1)
Sodium: 137 mmol/L (ref 135–145)

## 2021-09-14 LAB — MAGNESIUM: Magnesium: 2.2 mg/dL (ref 1.7–2.4)

## 2021-09-14 LAB — TSH: TSH: 2.604 u[IU]/mL (ref 0.308–3.960)

## 2021-09-14 MED ORDER — SODIUM CHLORIDE 0.9 % IV SOLN
40.0000 mg/m2 | Freq: Once | INTRAVENOUS | Status: AC
  Administered 2021-09-14: 13:00:00 68 mg via INTRAVENOUS
  Filled 2021-09-14: qty 68

## 2021-09-14 MED ORDER — SODIUM CHLORIDE 0.9 % IV SOLN
10.0000 mg | Freq: Once | INTRAVENOUS | Status: AC
  Administered 2021-09-14: 12:00:00 10 mg via INTRAVENOUS
  Filled 2021-09-14: qty 10

## 2021-09-14 MED ORDER — MAGNESIUM SULFATE 2 GM/50ML IV SOLN
2.0000 g | Freq: Once | INTRAVENOUS | Status: AC
  Administered 2021-09-14: 10:00:00 2 g via INTRAVENOUS
  Filled 2021-09-14: qty 50

## 2021-09-14 MED ORDER — PALONOSETRON HCL INJECTION 0.25 MG/5ML
0.2500 mg | Freq: Once | INTRAVENOUS | Status: AC
  Administered 2021-09-14: 12:00:00 0.25 mg via INTRAVENOUS
  Filled 2021-09-14: qty 5

## 2021-09-14 MED ORDER — SODIUM CHLORIDE 0.9% FLUSH
10.0000 mL | Freq: Once | INTRAVENOUS | Status: AC
  Administered 2021-09-14: 09:00:00 10 mL

## 2021-09-14 MED ORDER — HEPARIN SOD (PORK) LOCK FLUSH 100 UNIT/ML IV SOLN
500.0000 [IU] | Freq: Once | INTRAVENOUS | Status: AC | PRN
  Administered 2021-09-14: 16:00:00 500 [IU]

## 2021-09-14 MED ORDER — SODIUM CHLORIDE 0.9 % IV SOLN: Freq: Once | INTRAVENOUS | Status: AC

## 2021-09-14 MED ORDER — SODIUM CHLORIDE 0.9 % IV SOLN
150.0000 mg | Freq: Once | INTRAVENOUS | Status: AC
  Administered 2021-09-14: 12:00:00 150 mg via INTRAVENOUS
  Filled 2021-09-14: qty 150

## 2021-09-14 MED ORDER — SODIUM CHLORIDE 0.9% FLUSH
10.0000 mL | INTRAVENOUS | Status: DC | PRN
  Administered 2021-09-14: 16:00:00 10 mL

## 2021-09-14 MED ORDER — POTASSIUM CHLORIDE IN NACL 20-0.9 MEQ/L-% IV SOLN
Freq: Once | INTRAVENOUS | Status: AC
  Filled 2021-09-14: qty 1000

## 2021-09-14 NOTE — Patient Instructions (Signed)
[facility] MEDICAL ONCOLOGY  Discharge Instructions: Thank you for choosing [facility] to provide your oncology and hematology care.   If you have a lab appointment with the Cancer Center, please go directly to the Cancer Center and check in at the registration area.   Wear comfortable clothing and clothing appropriate for easy access to any Portacath or PICC line.   We strive to give you quality time with your provider. You may need to reschedule your appointment if you arrive late (15 or more minutes).  Arriving late affects you and other patients whose appointments are after yours.  Also, if you miss three or more appointments without notifying the office, you may be dismissed from the clinic at the provider's discretion.      For prescription refill requests, have your pharmacy contact our office and allow 72 hours for refills to be completed.    Today you received the following chemotherapy and/or immunotherapy agents : Cisplatin    To help prevent nausea and vomiting after your treatment, we encourage you to take your nausea medication as directed.  BELOW ARE SYMPTOMS THAT SHOULD BE REPORTED IMMEDIATELY: *FEVER GREATER THAN 100.4 F (38 C) OR HIGHER *CHILLS OR SWEATING *NAUSEA AND VOMITING THAT IS NOT CONTROLLED WITH YOUR NAUSEA MEDICATION *UNUSUAL SHORTNESS OF BREATH *UNUSUAL BRUISING OR BLEEDING *URINARY PROBLEMS (pain or burning when urinating, or frequent urination) *BOWEL PROBLEMS (unusual diarrhea, constipation, pain near the anus) TENDERNESS IN MOUTH AND THROAT WITH OR WITHOUT PRESENCE OF ULCERS (sore throat, sores in mouth, or a toothache) UNUSUAL RASH, SWELLING OR PAIN  UNUSUAL VAGINAL DISCHARGE OR ITCHING   Items with * indicate a potential emergency and should be followed up as soon as possible or go to the Emergency Department if any problems should occur.  Please show the CHEMOTHERAPY ALERT CARD or IMMUNOTHERAPY ALERT CARD at check-in to  the Emergency Department and triage nurse.  Should you have questions after your visit or need to cancel or reschedule your appointment, please contact [facility]  Dept: [phone]  and follow the prompts.  Office hours are 8:00 a.m. to 4:30 p.m. Monday - Friday. Please note that voicemails left after 4:00 p.m. may not be returned until the following business day.  We are closed weekends and major holidays. You have access to a nurse at all times for urgent questions. Please call the main number to the clinic Dept: [phone] and follow the prompts.   For any non-urgent questions, you may also contact your provider using MyChart. We now offer e-Visits for anyone 18 and older to request care online for non-urgent symptoms. For details visit [email or web address].   Also download the MyChart app! Go to the app store, search "MyChart", open the app, select [facility], and log in with your MyChart username and password.  Due to Covid, a mask is required upon entering the hospital/clinic. If you do not have a mask, one will be given to you upon arrival. For doctor visits, patients may have 1 support person aged 18 or older with them. For treatment visits, patients cannot have anyone with them due to current Covid guidelines and our immunocompromised population.   

## 2021-09-14 NOTE — Progress Notes (Signed)
Nutrition Follow-up:  Patient with recurrent laryngeal cancer. Single agent keytruda discontinued due to new hear metastasis. She is currently receiving cisplatin q7 days (2 weeks on 1 week off to allow for blood count recovery)  Met with patient during infusion. She reports increased fatigue lasting ~3 days after receiving chemotherapy. She has lost weight. She had been consistently giving 5 cartons [employer] [place] and drinking 2 Boost.  Patient reports giving [place] and 1 Boost the past couple of weeks. Patient reports drinking 5 (8oz) bottles of water by mouth, giving 2-3 (8oz) bottles of water via tube with feedings. She has not been eating soft foods or drinking supplements orally. Oral intake makes her feel nauseas since starting chemotherapy. She denies constipation, diarrhea, vomiting.   Medications: reviewed   Labs: reviewed  Anthropometrics: Weight 141 lb today decreased 2% in the last week; significant  12/16 - 144 lb 3.2 oz 11/25 - 147 lb 11/3 - 144 lb 6.4 oz   NUTRITION DIAGNOSIS: Food and nutrition knowledge deficit ongoing   INTERVENTION:  Increase [employer] 1.5 - Give 5 cartons/day via PEG 2 cartons of [employer] 1.5 samples provided for tube feeding during infusion today Patient has contact information     MONITORING, EVALUATION, GOAL: weight trends, intake, tube feeding   NEXT VISIT: Friday January 13 during infusion

## 2021-09-14 NOTE — Progress Notes (Signed)
Ok to treat with ANC 1.4 per Dr Lindi Adie

## 2021-09-18 ENCOUNTER — Encounter: Payer: Self-pay | Admitting: Hematology and Oncology

## 2021-09-18 ENCOUNTER — Other Ambulatory Visit (HOSPITAL_COMMUNITY): Payer: Self-pay

## 2021-09-18 MED ORDER — HYDROCODONE-ACETAMINOPHEN 5-325 MG PO TABS
1.0000 | ORAL_TABLET | Freq: Four times a day (QID) | ORAL | 0 refills | Status: DC | PRN
  Filled 2021-09-18: qty 60, 15d supply, fill #0

## 2021-09-19 DIAGNOSIS — Z6826 Body mass index (BMI) 26.0-26.9, adult: Secondary | ICD-10-CM | POA: Diagnosis not present

## 2021-09-19 DIAGNOSIS — E782 Mixed hyperlipidemia: Secondary | ICD-10-CM | POA: Diagnosis not present

## 2021-09-19 DIAGNOSIS — I700 Atherosclerosis of aorta: Secondary | ICD-10-CM | POA: Diagnosis not present

## 2021-09-19 DIAGNOSIS — R21 Rash and other nonspecific skin eruption: Secondary | ICD-10-CM | POA: Diagnosis not present

## 2021-09-19 DIAGNOSIS — C7989 Secondary malignant neoplasm of other specified sites: Secondary | ICD-10-CM | POA: Diagnosis not present

## 2021-09-19 DIAGNOSIS — C329 Malignant neoplasm of larynx, unspecified: Secondary | ICD-10-CM | POA: Diagnosis not present

## 2021-09-19 DIAGNOSIS — Z930 Tracheostomy status: Secondary | ICD-10-CM | POA: Diagnosis not present

## 2021-09-19 DIAGNOSIS — E892 Postprocedural hypoparathyroidism: Secondary | ICD-10-CM | POA: Diagnosis not present

## 2021-09-23 DIAGNOSIS — C321 Malignant neoplasm of supraglottis: Secondary | ICD-10-CM | POA: Diagnosis not present

## 2021-09-23 DIAGNOSIS — R633 Feeding difficulties, unspecified: Secondary | ICD-10-CM | POA: Diagnosis not present

## 2021-09-27 MED FILL — Fosaprepitant Dimeglumine For IV Infusion 150 MG (Base Eq): INTRAVENOUS | Qty: 5 | Status: AC

## 2021-09-27 MED FILL — Dexamethasone Sodium Phosphate Inj 100 MG/10ML: INTRAMUSCULAR | Qty: 1 | Status: AC

## 2021-09-28 ENCOUNTER — Other Ambulatory Visit: Payer: Self-pay | Admitting: Hematology and Oncology

## 2021-09-28 ENCOUNTER — Inpatient Hospital Stay: Payer: Medicare HMO | Admitting: Hematology and Oncology

## 2021-09-28 ENCOUNTER — Encounter: Payer: Self-pay | Admitting: Hematology and Oncology

## 2021-09-28 ENCOUNTER — Other Ambulatory Visit: Payer: Self-pay

## 2021-09-28 ENCOUNTER — Inpatient Hospital Stay: Payer: Medicare HMO | Attending: Hematology

## 2021-09-28 ENCOUNTER — Inpatient Hospital Stay: Payer: Medicare HMO

## 2021-09-28 VITALS — BP 115/77 | HR 89 | Temp 97.4°F | Resp 18 | Ht 62.0 in | Wt 143.0 lb

## 2021-09-28 DIAGNOSIS — Z5111 Encounter for antineoplastic chemotherapy: Secondary | ICD-10-CM | POA: Diagnosis present

## 2021-09-28 DIAGNOSIS — C329 Malignant neoplasm of larynx, unspecified: Secondary | ICD-10-CM

## 2021-09-28 DIAGNOSIS — E209 Hypoparathyroidism, unspecified: Secondary | ICD-10-CM

## 2021-09-28 DIAGNOSIS — I5189 Other ill-defined heart diseases: Secondary | ICD-10-CM | POA: Diagnosis not present

## 2021-09-28 DIAGNOSIS — Z95828 Presence of other vascular implants and grafts: Secondary | ICD-10-CM

## 2021-09-28 DIAGNOSIS — Z930 Tracheostomy status: Secondary | ICD-10-CM | POA: Diagnosis not present

## 2021-09-28 DIAGNOSIS — T451X5A Adverse effect of antineoplastic and immunosuppressive drugs, initial encounter: Secondary | ICD-10-CM | POA: Diagnosis not present

## 2021-09-28 DIAGNOSIS — Z7901 Long term (current) use of anticoagulants: Secondary | ICD-10-CM | POA: Diagnosis not present

## 2021-09-28 DIAGNOSIS — C7989 Secondary malignant neoplasm of other specified sites: Secondary | ICD-10-CM | POA: Diagnosis not present

## 2021-09-28 DIAGNOSIS — C321 Malignant neoplasm of supraglottis: Secondary | ICD-10-CM | POA: Insufficient documentation

## 2021-09-28 DIAGNOSIS — D61818 Other pancytopenia: Secondary | ICD-10-CM | POA: Diagnosis not present

## 2021-09-28 DIAGNOSIS — D61810 Antineoplastic chemotherapy induced pancytopenia: Secondary | ICD-10-CM | POA: Insufficient documentation

## 2021-09-28 LAB — BASIC METABOLIC PANEL - CANCER CENTER ONLY
Anion gap: 9 (ref 5–15)
BUN: 24 mg/dL — ABNORMAL HIGH (ref 8–23)
CO2: 26 mmol/L (ref 22–32)
Calcium: 8.6 mg/dL — ABNORMAL LOW (ref 8.9–10.3)
Chloride: 103 mmol/L (ref 98–111)
Creatinine: 0.74 mg/dL (ref 0.44–1.00)
GFR, Estimated: >60 mL/min (ref >60)
Glucose, Bld: 93 mg/dL (ref 70–99)
Potassium: 4.1 mmol/L (ref 3.5–5.1)
Sodium: 138 mmol/L (ref 135–145)

## 2021-09-28 LAB — CBC WITH DIFFERENTIAL (CANCER CENTER ONLY)
Abs Immature Granulocytes: 0 10*3/uL (ref 0.00–0.07)
Basophils Absolute: 0 10*3/uL (ref 0.0–0.1)
Basophils Relative: 0 %
Eosinophils Absolute: 0.1 10*3/uL (ref 0.0–0.5)
Eosinophils Relative: 3 %
HCT: 33.1 % — ABNORMAL LOW (ref 36.0–46.0)
Hemoglobin: 11.2 g/dL — ABNORMAL LOW (ref 12.0–15.0)
Immature Granulocytes: 0 %
Lymphocytes Relative: 33 %
Lymphs Abs: 0.8 10*3/uL (ref 0.7–4.0)
MCH: 29.8 pg (ref 26.0–34.0)
MCHC: 33.8 g/dL (ref 30.0–36.0)
MCV: 88 fL (ref 80.0–100.0)
Monocytes Absolute: 0.4 10*3/uL (ref 0.1–1.0)
Monocytes Relative: 15 %
Neutro Abs: 1.2 10*3/uL — ABNORMAL LOW (ref 1.7–7.7)
Neutrophils Relative %: 49 %
Platelet Count: 166 10*3/uL (ref 150–400)
RBC: 3.76 MIL/uL — ABNORMAL LOW (ref 3.87–5.11)
RDW: 15 % (ref 11.5–15.5)
WBC Count: 2.3 10*3/uL — ABNORMAL LOW (ref 4.0–10.5)
nRBC: 0 % (ref 0.0–0.2)

## 2021-09-28 LAB — MAGNESIUM: Magnesium: 2.3 mg/dL (ref 1.7–2.4)

## 2021-09-28 MED ORDER — PALONOSETRON HCL INJECTION 0.25 MG/5ML
0.2500 mg | Freq: Once | INTRAVENOUS | Status: AC
  Administered 2021-09-28: 0.25 mg via INTRAVENOUS
  Filled 2021-09-28: qty 5

## 2021-09-28 MED ORDER — POTASSIUM CHLORIDE IN NACL 20-0.9 MEQ/L-% IV SOLN
Freq: Once | INTRAVENOUS | Status: AC
  Filled 2021-09-28: qty 1000

## 2021-09-28 MED ORDER — SODIUM CHLORIDE 0.9 % IV SOLN
150.0000 mg | Freq: Once | INTRAVENOUS | Status: AC
  Administered 2021-09-28: 150 mg via INTRAVENOUS
  Filled 2021-09-28: qty 150

## 2021-09-28 MED ORDER — SODIUM CHLORIDE 0.9 % IV SOLN
32.0000 mg/m2 | Freq: Once | INTRAVENOUS | Status: AC
  Administered 2021-09-28: 54 mg via INTRAVENOUS
  Filled 2021-09-28: qty 54

## 2021-09-28 MED ORDER — MAGNESIUM SULFATE 2 GM/50ML IV SOLN
2.0000 g | Freq: Once | INTRAVENOUS | Status: AC
  Administered 2021-09-28: 2 g via INTRAVENOUS
  Filled 2021-09-28: qty 50

## 2021-09-28 MED ORDER — SODIUM CHLORIDE 0.9 % IV SOLN: Freq: Once | INTRAVENOUS | Status: AC

## 2021-09-28 MED ORDER — SODIUM CHLORIDE 0.9% FLUSH
10.0000 mL | INTRAVENOUS | Status: DC | PRN
  Administered 2021-09-28: 10 mL

## 2021-09-28 MED ORDER — HEPARIN SOD (PORK) LOCK FLUSH 100 UNIT/ML IV SOLN
500.0000 [IU] | Freq: Once | INTRAVENOUS | Status: AC | PRN
  Administered 2021-09-28: 500 [IU]

## 2021-09-28 MED ORDER — SODIUM CHLORIDE 0.9% FLUSH
10.0000 mL | Freq: Once | INTRAVENOUS | Status: AC
  Administered 2021-09-28: 10 mL

## 2021-09-28 MED ORDER — SODIUM CHLORIDE 0.9 % IV SOLN
10.0000 mg | Freq: Once | INTRAVENOUS | Status: AC
  Administered 2021-09-28: 10 mg via INTRAVENOUS
  Filled 2021-09-28: qty 10

## 2021-09-28 NOTE — Progress Notes (Signed)
[place] OFFICE PROGRESS NOTE  Patient Care Team: Hamrick, Lorin Mercy, MD as PCP - General (Family Medicine) Heath Lark, MD as Consulting Physician (Hematology and Oncology)  ASSESSMENT & PLAN:  Laryngeal squamous cell carcinoma ([place]) I recommend cardiac MRI in 2 weeks for objective assessment of response to treatment So far, she tolerated treatment well except for mild pancytopenia Plan minor dose adjustment to her treatment today  Hypoparathyroidism ([place]) We monitor her thyroid function closely We will adjust the dose of Synthroid as needed  Pancytopenia, acquired [facility]) She has acquired pancytopenia due to treatment I plan minor dose adjustment for this  Orders Placed This Encounter  Procedures   MR CARDIAC MORPHOLOGY W WO CONTRAST    Standing Status:   Future    Standing Expiration Date:   09/28/2022    Order Specific Question:   If indicated for the ordered procedure, I authorize the administration of contrast media per Radiology protocol    Answer:   Yes    Order Specific Question:   What is the patient's sedation requirement?    Answer:   No Sedation    Order Specific Question:   Does the patient have a pacemaker or implanted devices?    Answer:   No    Order Specific Question:   Preferred imaging location?    Answer:   [facility] (table limit - 500 lbs)    All questions were answered. The patient knows to call the clinic with any problems, questions or concerns. The total time spent in the appointment was 30 minutes encounter with patients including review of chart and various tests results, discussions about plan of care and coordination of care plan   Heath Lark, MD 09/28/2021 4:51 PM  INTERVAL HISTORY: Please see below for problem oriented charting. she returns for treatment follow-up seen prior to her weekly cisplatin treatment She is accompanied by her son She has minimal pain, well controlled with pain medicine She has minimum bleeding  through her tracheostomy site No nausea, vomiting, changes in hearing or neuropathy  REVIEW OF SYSTEMS:   Constitutional: Denies fevers, chills or abnormal weight loss Eyes: Denies blurriness of vision Ears, nose, mouth, throat, and face: Denies mucositis or sore throat Respiratory: Denies cough, dyspnea or wheezes Cardiovascular: Denies palpitation, chest discomfort or lower extremity swelling Gastrointestinal:  Denies nausea, heartburn or change in bowel habits Skin: Denies abnormal skin rashes Lymphatics: Denies new lymphadenopathy or easy bruising Neurological:Denies numbness, tingling or new weaknesses Behavioral/Psych: Mood is stable, no new changes  All other systems were reviewed with the patient and are negative.  I have reviewed the past medical history, past surgical history, social history and family history with the patient and they are unchanged from previous note.  ALLERGIES:  has No Known Allergies.  MEDICATIONS:  Current Outpatient Medications  Medication Sig Dispense Refill   apixaban (ELIQUIS) 2.5 MG TABS tablet Take 1 tablet (2.5 mg total) by mouth 2 (two) times daily. 60 tablet 3   apixaban (ELIQUIS) 2.5 MG TABS tablet Take 1 tablet (2.5 mg total) by mouth 2 (two) times daily. 60 tablet 3   Cholecalciferol (VITAMIN D3) 125 MCG (5000 UT) CAPS Take 5,000 Units by mouth daily.     HYDROcodone-acetaminophen (NORCO/VICODIN) 5-325 MG tablet TAKE 1 TABLET BY MOUTH EVERY 6 HOURS AS NEEDED FOR MODERATE PAIN 60 tablet 0   levothyroxine (SYNTHROID) 137 MCG tablet Take 137 mcg by mouth daily before breakfast.     lidocaine-prilocaine (EMLA) cream  Apply topically daily as needed. 30 g 9   Nutritional Supplements ([place] PEPTIDE 1.5) LIQD Take 4 Bottles by mouth daily. 325 mL 11   ondansetron (ZOFRAN) 8 MG tablet Take 1 tablet (8 mg total) by mouth every 8 (eight) hours as needed for nausea. 30 tablet 3   oxymetazoline (AFRIN) 0.05 % nasal spray Place 3 sprays into both  nostrils 2 (two) times daily as needed (Bleeding from stoma). 30 mL 0   prochlorperazine (COMPAZINE) 10 MG tablet Take 1 tablet (10 mg total) by mouth every 6 (six) hours as needed for nausea or vomiting. 30 tablet 1   No current facility-administered medications for this visit.   Facility-Administered Medications Ordered in Other Visits  Medication Dose Route Frequency Provider Last Rate Last Admin   sodium chloride flush (NS) 0.9 % injection 10 mL  10 mL Intracatheter PRN Alvy Bimler, Detta Mellin, MD   10 mL at 09/28/21 1622    SUMMARY OF ONCOLOGIC HISTORY: Oncology History  Laryngeal squamous cell carcinoma ([place])  12/02/2018 Imaging   CT neck: IMPRESSION: 1. Supraglottic laryngeal carcinoma extending across the anterior commissure and invading the prelaryngeal fat, left aryepiglottic fold and left aspect of the epiglottis. 2. Severe narrowing of the laryngeal airway. 3. Bilateral subcentimeter level 2A and 2A cervical lymph nodes.     12/02/2018 Imaging   CT  Chest:  IMPRESSION: No evidence of metastatic disease in the chest.   12/03/2018 Pathology Results   (Care Everywhere; [facility]) A: Larynx, supraglottis, biopsy - Atypical squamous proliferation with at least low grade dysplasia and abundant keratinization - No definite high grade dysplasia or invasive carcinoma represented (see comment)   12/18/2018 Pathology Results   (Care Everywhere; [facility]) A:  Neck, left, fine needle aspiration - Rare single atypical cells, no cells diagnostic of malignancy - Lymph node material present  B:  Neck, right, fine needle aspiration - Non-diagnostic specimen - No lymph node material identified - Mixed inflammation and blood   12/31/2018 Pathology Results   (Care Everywhere; [facility])  A: Larynx, left supraglottic mass, biopsy - Atypical squamous proliferation with abundant keratinization, suspicious for well-differentiated invasive squamous cell carcinoma  B: Larynx, left  supraglottic mass, biopsy - Well-differentiated invasive squamous cell carcinoma with abundant keratinization (see comment)   01/18/2019 Initial Diagnosis   Laryngeal cancer ([place])   01/26/2019 Imaging   1. Locally advanced hypermetabolic laryngeal neoplasm centered in the left supraglottic space with bilateral glottic and epiglottic involvement and subglottic extension on the left. 2. Hypermetabolic bilateral level 3 neck nodal metastases. 3. Hypermetabolic high mediastinal nodal metastases between the trachea and upper thoracic esophagus. 4. Otherwise no distant hypermetabolic metastatic disease. 5. Nodular 7 mm focus at the left lung base, below PET resolution, recommend attention on follow-up chest CT in 3 months. 6. Chronic findings include: Aortic Atherosclerosis (ICD10-I70.0). Cholelithiasis.   02/08/2019 Cancer Staging   Staging form: Larynx - Supraglottis, AJCC 8th Edition - Clinical stage from 02/08/2019: Stage IVA (cT3, cN2c, cM0) - Signed by Eppie Gibson, MD on 02/08/2019    02/11/2019 - 04/07/2019 Chemotherapy   The patient weekly cisplatin for chemotherapy treatment.     07/20/2019 Pathology Results   PROCEDURE: Total glossectomy, total pharyngectomy, total laryngectomy, total thyroidectomy, and cervical esophagectomy TUMOR SITE: Larynx TUMOR LATERALITY: Not specified TUMOR FOCALITY: Unifocal TUMOR SIZE:    GREATEST DIMENSION: 6.9 cm    ADDITIONAL DIMENSIONS: 5.4 x 2.4 cm HISTOLOGIC TYPE: Squamous cell carcinoma, conventional (keratinizing) HISTOLOGIC GRADE:  G1-G2: Well to moderately  differentiated TUMOR EXTENSION: Tumor involves the posterior tongue, pharynx, larynx, tracheal cartilage, hyoid bone, cervical esophagus, vagus nerve, carotid artery, deep cervical fascia, ans surrounding skeletal muscle and soft tissue MARGINS: Uninvolved by invasive tumor; Perineural invasion is less than 83mm from the left soft tissue margin LYMPHOVASCULAR INVASION: Not  identified PERINEURAL INVASION:  Present REGIONAL LYMPH NODES:     NUMBER OF LYMPH NODES INVOLVED: 2     NUMBER OF LYMPH NODES EXAMINED: 24     LATERALITY OF LYMPH NODES INVOLVED: Cannot determine     SIZE OF LARGEST METASTATIC DEPOSIT: 0.8 cm     EXTRANODAL EXTENSION: Not identified PATHOLOGIC STAGE CLASSIFICATION (pTNM, AJCC 8TH Ed): (r)(y) pT4b pN2a DISTANT METASTASIS (pM): ADDITIONAL PATHOLOGIC FINDINGS: Previous chemoradiation   11/04/2019 Imaging   PET: 1.  Hypermetabolic bilateral neck lesions as detailed above are concerning for disease recurrence. 2.  Compared to 07/08/2019 PET scan, interval development of subcentimeter bilateral upper lobe pulmonary nodules, size below PET resolution. These are concerning for metastasis. Consider diagnostic quality CT chest exam for further evaluation as per oncology treatment planning. 3.  Focal hypermetabolic uptake in right ventricular cavity of indeterminate significance. Recommend echocardiographic examination to rule out underlying thrombus. This finding was discussed with Dr. Conley Canal by Dr. Clementeen Graham and Dr. Marcello Moores on 11/04/2019 at approximately 3:00 PM via telephone. 4.  Ancillary CT findings as above.   11/05/2019 Imaging   Echocardiogram: Summary   1. The left ventricle is normal in size with normal wall thickness.   2. The left ventricular systolic function is normal, LVEF is visually estimated at 60-65%.   3. The right ventricle is normal in size, with normal systolic function.   4. There is an extrinsic compression of the RV and RA laterally at the AV groove by a mass of 4.3 cm diameter. Unclear if the mass is invasive, although no pericardial effusion seen suggestive no invasion possibly. There is no evidence of hemodynamic compromise secondary to cardiac compression with normal IVC diameter.   12/06/2019 Pathology Results   A. LYMPH NODE, LEFT SUBMANDIBULAR, BIOPSY:  - Squamous cell carcinoma.  - No distinct nodal tissue  identified.    12/06/2019 Procedure   Successful ultrasound-guided core biopsy of centrally necrotic left submandibular lymph node. Of note, due to extensive prior postsurgical and post radiation changes, patient has very limited mobility in the neck and the biopsy was extremely challenging    12/16/2019 -  Chemotherapy   The patient had pembrolizumab for chemotherapy treatment.     03/08/2020 PET scan   1. Diminished size of masses in the neck as described. Persistent increased metabolic activity particularly in the LEFT neck. Some of this diminished size on the LEFT could be due to decompression of necrotic material. Correlate with any symptoms of inflammation or infection in this area as well given the paucity of residual soft tissue associated with the above abnormality, overall findings are compatible with residual disease in this location and there is no significant surrounding stranding to indicate inflammation that would correlate with the degree of FDG uptake remaining. 2. Diminished activity in the juxta cardiac mass with peripheral activity suggesting interval necrosis, on the prior study this was uniformly hypermetabolic. There is still considerable FDG uptake. 3. No signs of disease in the abdomen or pelvis. 4. 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 Correlate with any new pain or symptoms in this area with further imaging as warranted. No discrete mass or bone destruction on today's study.   06/08/2020 Imaging  CT neck 1. Status post laryngectomy and neck dissection without lymphadenopathy or mass lesion. 2. Short segment severe stenosis of the left internal carotid artery adjacent to a surgical clip.     06/08/2020 Imaging   CT chest 1. There is a large, hypodense lesion within, or very closely abutting and likely intruding upon the right ventricle, as seen on prior examination and approximately 4.6 x 4.3 cm, poorly evaluated due to cardiac motion. Findings are consistent with  metastatic lesion and/or thrombus and poorly assessed by ungated CT. Consider echocardiography and cardiac MRI for further assessment.   2. Multiple small pulmonary nodules in the bilateral lung apices are unchanged. Attention on follow-up.   3.  No evidence of new metastatic disease in the chest.   4.  Aortic Atherosclerosis (ICD10-I70.0).     09/07/2020 Imaging   1. Mild decrease in size of juxta cardiac metastatic involving the right ventricle. 2. Small pulmonary nodules are unchanged from previous exam. 3. Aortic atherosclerosis. 4. Gallstone.     12/14/2020 Procedure   Successful exchange and up size from 20 French pull-through gastrostomy to 22 French balloon retention gastrostomy.   03/30/2021 Imaging   Ct neck 1.  In comparison with PET/CT from February 2021, marked interval decrease in size of soft tissue mass along the angle of the left mandible consistent with a positive treatment response. However, there are new destructive bony changes along the lingual surface of the mandible in this region with appearance of soft tissue infiltrating the marrow cavity. While concerning for neoplastic involvement, changes could also represent other nonspecific osteonecrosis including infection or sequela of prior radiation treatment. Recommend correlation with direct visual inspection. Further evaluation with MRI may be of value.  2.  Centrally necrotic 1.3 cm right level IIa lymph node corresponding with previously described hypermetabolic lesion, decreased in size from prior. No suspicious new or enlarged lymph nodes in the neck.  3.  Enlarged right lower paratracheal lymph node suspected in the mediastinum, incompletely imaged. Consider dedicated CT imaging of the chest.  4.  Scattered pulmonary nodules in the imaged lung apices, the largest a 9 mm lesion in the right upper lobe. Recommend comparison with outside imaging ofthe chest if available.   05/03/2021 Echocardiogram    1. Left  ventricular ejection fraction, by estimation, is 55 to 60%. The left ventricle has normal function. The left ventricle has no regional wall motion abnormalities. There is mild left ventricular hypertrophy. Left ventricular diastolic parameters are consistent with Grade I diastolic dysfunction (impaired relaxation).  2. Prominent echogenic structure on the RV free wall, possibly attached to the TV - measuring about 2.0 cm in diameter and 3.0 cm in length - some mobile filamentous material is attached, suggestive of thrombus or less likely tumor.. Right ventricular systolic function is normal. The right ventricular size is normal.  3. The mitral valve is grossly normal. Trivial mitral valve regurgitation.  4. The aortic valve is tricuspid. Aortic valve regurgitation is not visualized.     05/04/2021 Imaging   RIGHT: - There is no evidence of deep vein thrombosis in the lower extremity.   - No cystic structure found in the popliteal fossa.   LEFT: - There is no evidence of deep vein thrombosis in the lower extremity.   - No cystic structure found in the popliteal fossa.     07/12/2021 Imaging   1. A large, heterogeneous mass about and within the right ventricle is difficult to clearly assess due to cardiac motion and  mixing artifact on this non tailored examination, but suspect it has significantly increased in size, and now involves the right atrium in addition to the right ventricle and pulmonary outflow tract, measuring approximately 6.9 x 4.9 cm, previously 4.5 x 4.2 cm when measured similarly. This may reflect a combination of metastatic soft tissue and thrombus, and could be more clearly assessed by echocardiography and cardiac MRI. 2. No significant change of a right upper lobe pulmonary nodule, measuring 0.8 cm, which remains modestly suspicious for a metastasis. Other small nodules are likewise stable. Attention on follow-up. 3. Status post laryngectomy and tracheostomy. 4. Coronary artery  disease.   07/24/2021 Echocardiogram    1. There is an echogenic RV and RVOT mass. Attachment appears to be on the right ventricular mid wall. Mass is 32 by 24 mm, though may be underestimated. Right ventricular systolic function is normal. The right ventricular size is normal. There is normal pulmonary artery systolic pressure.  2. Left ventricular ejection fraction, by estimation, is 60 to 65%. The left ventricle has normal function. The left ventricle has no regional wall motion abnormalities. Left ventricular diastolic parameters are consistent with Grade I diastolic dysfunction (impaired relaxation). The average left ventricular global longitudinal strain is -19.0 %. The global longitudinal strain is normal.  3. The mitral valve is normal in structure. No evidence of mitral valve regurgitation.  4. The aortic valve is tricuspid. Aortic valve regurgitation is not visualized. No aortic stenosis is present.   Comparison(s): A prior study was performed on 05/03/21. Prior images reviewed side by side. Similar size of RV mass to slight increase. Do not see right atrial involvement in this study.   Conclusion(s)/Recommendation(s): If concerned for tumor vs thombus, cardiac MRI could be considered   08/02/2021 Imaging   1. Large RV mass measuring 58mm x 45mm x 41mm. Mass is isointense to myocardium on T1 weighted imaging, does not suppress with fat saturation, isointense on T2 weighted imaging, perfuses on first pass perfusion, and heterogenous enhancement on LGE imaging. This is consistent with tumor, and in setting of known metastatic laryngeal cancer, is consistent with metastasis. There does appear to be thrombus adherent to tumor   2. Normal LV size and systolic function (EF 29%). No late gadolinium enhancement to suggest myocardial scar   3.  Normal RV size with mild systolic dysfunction (EF 79%)   08/17/2021 -  Chemotherapy   Patient is on Treatment Plan : HEAD/NECK Cisplatin q7d        PHYSICAL EXAMINATION: ECOG PERFORMANCE STATUS: 1 - Symptomatic but completely ambulatory  Vitals:   09/28/21 0906  BP: 115/77  Pulse: 89  Resp: 18  Temp: (!) 97.4 F (36.3 C)  SpO2: 99%   Filed Weights   09/28/21 0906  Weight: 143 lb (64.9 kg)    GENERAL:alert, no distress and comfortable SKIN: skin color, texture, turgor are normal, no rashes or significant lesions EYES: normal, Conjunctiva are pink and non-injected, sclera clear OROPHARYNX:no exudate, no erythema and lips, buccal mucosa, and tongue normal  LYMPH:  no palpable lymphadenopathy in the cervical, axillary or inguinal LUNGS: clear to auscultation and percussion with normal breathing effort HEART: regular rate & rhythm and no murmurs and no lower extremity edema ABDOMEN:abdomen soft, non-tender and normal bowel sounds Musculoskeletal:no cyanosis of digits and no clubbing  NEURO: alert & oriented x 3, no focal motor/sensory deficits  LABORATORY DATA:  I have reviewed the data as listed    Component Value Date/Time   NA 138 09/28/2021  0845   K 4.1 09/28/2021 0845   CL 103 09/28/2021 0845   CO2 26 09/28/2021 0845   GLUCOSE 93 09/28/2021 0845   BUN 24 (H) 09/28/2021 0845   CREATININE 0.74 09/28/2021 0845   CALCIUM 8.6 (L) 09/28/2021 0845   PROT 7.4 08/14/2021 0905   ALBUMIN 3.9 08/14/2021 0905   AST 19 08/14/2021 0905   ALT 16 08/14/2021 0905   ALKPHOS 72 08/14/2021 0905   BILITOT 0.3 08/14/2021 0905   GFRNONAA >60 09/28/2021 0845   GFRAA >60 06/08/2020 1303   GFRAA >60 02/17/2020 1128    No results found for: SPEP, UPEP  Lab Results  Component Value Date   WBC 2.3 (L) 09/28/2021   NEUTROABS 1.2 (L) 09/28/2021   HGB 11.2 (L) 09/28/2021   HCT 33.1 (L) 09/28/2021   MCV 88.0 09/28/2021   PLT 166 09/28/2021      Chemistry      Component Value Date/Time   NA 138 09/28/2021 0845   K 4.1 09/28/2021 0845   CL 103 09/28/2021 0845   CO2 26 09/28/2021 0845   BUN 24 (H) 09/28/2021 0845    CREATININE 0.74 09/28/2021 0845      Component Value Date/Time   CALCIUM 8.6 (L) 09/28/2021 0845   ALKPHOS 72 08/14/2021 0905   AST 19 08/14/2021 0905   ALT 16 08/14/2021 0905   BILITOT 0.3 08/14/2021 0905

## 2021-09-28 NOTE — Patient Instructions (Signed)
[facility]  Discharge Instructions: Thank you for choosing [facility] to provide your oncology and hematology care.   If you have a lab appointment with the Cancer Center, please go directly to the Cancer Center and check in at the registration area.   Wear comfortable clothing and clothing appropriate for easy access to any Portacath or PICC line.   We strive to give you quality time with your provider. You may need to reschedule your appointment if you arrive late (15 or more minutes).  Arriving late affects you and other patients whose appointments are after yours.  Also, if you miss three or more appointments without notifying the office, you may be dismissed from the clinic at the provider's discretion.      For prescription refill requests, have your pharmacy contact our office and allow 72 hours for refills to be completed.    Today you received the following chemotherapy and/or immunotherapy agents : Cisplatin    To help prevent nausea and vomiting after your treatment, we encourage you to take your nausea medication as directed.  BELOW ARE SYMPTOMS THAT SHOULD BE REPORTED IMMEDIATELY: *FEVER GREATER THAN 100.4 F (38 C) OR HIGHER *CHILLS OR SWEATING *NAUSEA AND VOMITING THAT IS NOT CONTROLLED WITH YOUR NAUSEA MEDICATION *UNUSUAL SHORTNESS OF BREATH *UNUSUAL BRUISING OR BLEEDING *URINARY PROBLEMS (pain or burning when urinating, or frequent urination) *BOWEL PROBLEMS (unusual diarrhea, constipation, pain near the anus) TENDERNESS IN MOUTH AND THROAT WITH OR WITHOUT PRESENCE OF ULCERS (sore throat, sores in mouth, or a toothache) UNUSUAL RASH, SWELLING OR PAIN  UNUSUAL VAGINAL DISCHARGE OR ITCHING   Items with * indicate a potential emergency and should be followed up as soon as possible or go to the Emergency Department if any problems should occur.  Please show the CHEMOTHERAPY ALERT CARD or IMMUNOTHERAPY ALERT CARD at check-in to  the Emergency Department and triage nurse.  Should you have questions after your visit or need to cancel or reschedule your appointment, please contact [facility]  Dept: [phone]  and follow the prompts.  Office hours are 8:00 a.m. to 4:30 p.m. Monday - Friday. Please note that voicemails left after 4:00 p.m. may not be returned until the following business day.  We are closed weekends and major holidays. You have access to a nurse at all times for urgent questions. Please call the main number to the clinic Dept: [phone] and follow the prompts.   For any non-urgent questions, you may also contact your provider using MyChart. We now offer e-Visits for anyone 18 and older to request care online for non-urgent symptoms. For details visit [email or web address].   Also download the MyChart app! Go to the app store, search "MyChart", open the app, select [place], and log in with your MyChart username and password.  Due to Covid, a mask is required upon entering the hospital/clinic. If you do not have a mask, one will be given to you upon arrival. For doctor visits, patients may have 1 support person aged 18 or older with them. For treatment visits, patients cannot have anyone with them due to current Covid guidelines and our immunocompromised population.   

## 2021-09-28 NOTE — Progress Notes (Signed)
Per Dr. Alvy Bimler, okay to treat with Colon 1.2.

## 2021-09-28 NOTE — Assessment & Plan Note (Signed)
We monitor her thyroid function closely We will adjust the dose of Synthroid as needed

## 2021-09-28 NOTE — Assessment & Plan Note (Signed)
She has acquired pancytopenia due to treatment I plan minor dose adjustment for this

## 2021-09-28 NOTE — Assessment & Plan Note (Signed)
I recommend cardiac MRI in 2 weeks for objective assessment of response to treatment So far, she tolerated treatment well except for mild pancytopenia Plan minor dose adjustment to her treatment today

## 2021-10-04 MED FILL — Fosaprepitant Dimeglumine For IV Infusion 150 MG (Base Eq): INTRAVENOUS | Qty: 5 | Status: AC

## 2021-10-05 ENCOUNTER — Other Ambulatory Visit: Payer: Self-pay

## 2021-10-05 ENCOUNTER — Other Ambulatory Visit: Payer: Self-pay | Admitting: Hematology and Oncology

## 2021-10-05 ENCOUNTER — Inpatient Hospital Stay: Payer: Medicare HMO | Admitting: Dietician

## 2021-10-05 ENCOUNTER — Inpatient Hospital Stay: Payer: Medicare HMO

## 2021-10-05 VITALS — BP 106/73 | HR 82 | Temp 97.6°F | Resp 18 | Wt 142.0 lb

## 2021-10-05 DIAGNOSIS — Z930 Tracheostomy status: Secondary | ICD-10-CM | POA: Diagnosis not present

## 2021-10-05 DIAGNOSIS — C329 Malignant neoplasm of larynx, unspecified: Secondary | ICD-10-CM

## 2021-10-05 DIAGNOSIS — C321 Malignant neoplasm of supraglottis: Secondary | ICD-10-CM | POA: Diagnosis not present

## 2021-10-05 DIAGNOSIS — Z7901 Long term (current) use of anticoagulants: Secondary | ICD-10-CM | POA: Diagnosis not present

## 2021-10-05 DIAGNOSIS — D61810 Antineoplastic chemotherapy induced pancytopenia: Secondary | ICD-10-CM | POA: Diagnosis not present

## 2021-10-05 DIAGNOSIS — T451X5A Adverse effect of antineoplastic and immunosuppressive drugs, initial encounter: Secondary | ICD-10-CM | POA: Diagnosis not present

## 2021-10-05 DIAGNOSIS — E209 Hypoparathyroidism, unspecified: Secondary | ICD-10-CM | POA: Diagnosis not present

## 2021-10-05 DIAGNOSIS — Z5111 Encounter for antineoplastic chemotherapy: Secondary | ICD-10-CM | POA: Diagnosis not present

## 2021-10-05 DIAGNOSIS — C7989 Secondary malignant neoplasm of other specified sites: Secondary | ICD-10-CM | POA: Diagnosis not present

## 2021-10-05 DIAGNOSIS — Z95828 Presence of other vascular implants and grafts: Secondary | ICD-10-CM

## 2021-10-05 LAB — CBC WITH DIFFERENTIAL (CANCER CENTER ONLY)
Abs Immature Granulocytes: 0.03 10*3/uL (ref 0.00–0.07)
Basophils Absolute: 0 10*3/uL (ref 0.0–0.1)
Basophils Relative: 0 %
Eosinophils Absolute: 0.1 10*3/uL (ref 0.0–0.5)
Eosinophils Relative: 3 %
HCT: 33.2 % — ABNORMAL LOW (ref 36.0–46.0)
Hemoglobin: 11 g/dL — ABNORMAL LOW (ref 12.0–15.0)
Immature Granulocytes: 1 %
Lymphocytes Relative: 34 %
Lymphs Abs: 1 10*3/uL (ref 0.7–4.0)
MCH: 29.4 pg (ref 26.0–34.0)
MCHC: 33.1 g/dL (ref 30.0–36.0)
MCV: 88.8 fL (ref 80.0–100.0)
Monocytes Absolute: 0.5 10*3/uL (ref 0.1–1.0)
Monocytes Relative: 17 %
Neutro Abs: 1.3 10*3/uL — ABNORMAL LOW (ref 1.7–7.7)
Neutrophils Relative %: 45 %
Platelet Count: 208 10*3/uL (ref 150–400)
RBC: 3.74 MIL/uL — ABNORMAL LOW (ref 3.87–5.11)
RDW: 15.4 % (ref 11.5–15.5)
WBC Count: 2.8 10*3/uL — ABNORMAL LOW (ref 4.0–10.5)
nRBC: 0 % (ref 0.0–0.2)

## 2021-10-05 LAB — BASIC METABOLIC PANEL - CANCER CENTER ONLY
Anion gap: 10 (ref 5–15)
BUN: 25 mg/dL — ABNORMAL HIGH (ref 8–23)
CO2: 25 mmol/L (ref 22–32)
Calcium: 8.5 mg/dL — ABNORMAL LOW (ref 8.9–10.3)
Chloride: 102 mmol/L (ref 98–111)
Creatinine: 0.74 mg/dL (ref 0.44–1.00)
GFR, Estimated: >60 mL/min (ref >60)
Glucose, Bld: 94 mg/dL (ref 70–99)
Potassium: 4.2 mmol/L (ref 3.5–5.1)
Sodium: 137 mmol/L (ref 135–145)

## 2021-10-05 LAB — MAGNESIUM: Magnesium: 2.3 mg/dL (ref 1.7–2.4)

## 2021-10-05 MED ORDER — PALONOSETRON HCL INJECTION 0.25 MG/5ML
0.2500 mg | Freq: Once | INTRAVENOUS | Status: AC
  Administered 2021-10-05: 0.25 mg via INTRAVENOUS
  Filled 2021-10-05: qty 5

## 2021-10-05 MED ORDER — POTASSIUM CHLORIDE IN NACL 20-0.9 MEQ/L-% IV SOLN
Freq: Once | INTRAVENOUS | Status: AC
  Filled 2021-10-05: qty 1000

## 2021-10-05 MED ORDER — SODIUM CHLORIDE 0.9 % IV SOLN
32.0000 mg/m2 | Freq: Once | INTRAVENOUS | Status: AC
  Administered 2021-10-05: 54 mg via INTRAVENOUS
  Filled 2021-10-05: qty 54

## 2021-10-05 MED ORDER — SODIUM CHLORIDE 0.9 % IV SOLN: Freq: Once | INTRAVENOUS | Status: AC

## 2021-10-05 MED ORDER — SODIUM CHLORIDE 0.9% FLUSH
10.0000 mL | INTRAVENOUS | Status: DC | PRN
  Administered 2021-10-05: 10 mL

## 2021-10-05 MED ORDER — SODIUM CHLORIDE 0.9 % IV SOLN
150.0000 mg | Freq: Once | INTRAVENOUS | Status: AC
  Administered 2021-10-05: 150 mg via INTRAVENOUS
  Filled 2021-10-05: qty 150

## 2021-10-05 MED ORDER — MAGNESIUM SULFATE 2 GM/50ML IV SOLN
2.0000 g | Freq: Once | INTRAVENOUS | Status: AC
  Administered 2021-10-05: 2 g via INTRAVENOUS
  Filled 2021-10-05: qty 50

## 2021-10-05 MED ORDER — SODIUM CHLORIDE 0.9 % IV SOLN
10.0000 mg | Freq: Once | INTRAVENOUS | Status: AC
  Administered 2021-10-05: 10 mg via INTRAVENOUS
  Filled 2021-10-05: qty 10

## 2021-10-05 MED ORDER — HEPARIN SOD (PORK) LOCK FLUSH 100 UNIT/ML IV SOLN
500.0000 [IU] | Freq: Once | INTRAVENOUS | Status: AC | PRN
  Administered 2021-10-05: 500 [IU]

## 2021-10-05 MED ORDER — SODIUM CHLORIDE 0.9% FLUSH
10.0000 mL | Freq: Once | INTRAVENOUS | Status: AC
  Administered 2021-10-05: 10 mL

## 2021-10-05 NOTE — Patient Instructions (Signed)
[facility]  Discharge Instructions: Thank you for choosing [facility] to provide your oncology and hematology care.   If you have a lab appointment with the Cancer Center, please go directly to the Cancer Center and check in at the registration area.   Wear comfortable clothing and clothing appropriate for easy access to any Portacath or PICC line.   We strive to give you quality time with your provider. You may need to reschedule your appointment if you arrive late (15 or more minutes).  Arriving late affects you and other patients whose appointments are after yours.  Also, if you miss three or more appointments without notifying the office, you may be dismissed from the clinic at the provider's discretion.      For prescription refill requests, have your pharmacy contact our office and allow 72 hours for refills to be completed.    Today you received the following chemotherapy and/or immunotherapy agents cisplatin   To help prevent nausea and vomiting after your treatment, we encourage you to take your nausea medication as directed.  BELOW ARE SYMPTOMS THAT SHOULD BE REPORTED IMMEDIATELY: *FEVER GREATER THAN 100.4 F (38 C) OR HIGHER *CHILLS OR SWEATING *NAUSEA AND VOMITING THAT IS NOT CONTROLLED WITH YOUR NAUSEA MEDICATION *UNUSUAL SHORTNESS OF BREATH *UNUSUAL BRUISING OR BLEEDING *URINARY PROBLEMS (pain or burning when urinating, or frequent urination) *BOWEL PROBLEMS (unusual diarrhea, constipation, pain near the anus) TENDERNESS IN MOUTH AND THROAT WITH OR WITHOUT PRESENCE OF ULCERS (sore throat, sores in mouth, or a toothache) UNUSUAL RASH, SWELLING OR PAIN  UNUSUAL VAGINAL DISCHARGE OR ITCHING   Items with * indicate a potential emergency and should be followed up as soon as possible or go to the Emergency Department if any problems should occur.  Please show the CHEMOTHERAPY ALERT CARD or IMMUNOTHERAPY ALERT CARD at check-in to the  Emergency Department and triage nurse.  Should you have questions after your visit or need to cancel or reschedule your appointment, please contact [facility]  Dept: [phone]  and follow the prompts.  Office hours are 8:00 a.m. to 4:30 p.m. Monday - Friday. Please note that voicemails left after 4:00 p.m. may not be returned until the following business day.  We are closed weekends and major holidays. You have access to a nurse at all times for urgent questions. Please call the main number to the clinic Dept: [phone] and follow the prompts.   For any non-urgent questions, you may also contact your provider using MyChart. We now offer e-Visits for anyone 18 and older to request care online for non-urgent symptoms. For details visit [email or web address].   Also download the MyChart app! Go to the app store, search "MyChart", open the app, select [facility], and log in with your MyChart username and password.  Due to Covid, a mask is required upon entering the hospital/clinic. If you do not have a mask, one will be given to you upon arrival. For doctor visits, patients may have 1 support person aged 18 or older with them. For treatment visits, patients cannot have anyone with them due to current Covid guidelines and our immunocompromised population.   

## 2021-10-05 NOTE — Progress Notes (Signed)
Called patient's son for pickup

## 2021-10-05 NOTE — Progress Notes (Signed)
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 She is currently receiving cisplatin q7 days (2 weeks on 1 week off to allow for blood count recovery)  Met with patient during infusion. She reports nausea with oral intake lasting 2-3 days after treatment. She denies episodes of vomiting. Patient denies nausea with tube feedings. She is giving 5 cartons [employer] 1.5 and 1 Boost via tube. She reports 5-6 (8oz) bottles of water daily. Patient denies constipation, diarrhea.    Medications: reviewed  Labs: BUN 25  Anthropometrics: Weight 142 lb today stable  1/6 - 143 lb  12/23 - 141 lb 12/16 - 144 lb 3.2 oz 11/25 - 147 lb 11/3 - 144 lb 6.4 oz    NUTRITION DIAGNOSIS: Food and nutrition knowledge deficit stable   INTERVENTION:  Continue 5 cartons [employer] 1.5 daily  Patient has contact information     MONITORING, EVALUATION, GOAL: weight trends, intake, tube feedings   NEXT VISIT: Friday February 3 during infusion

## 2021-10-18 ENCOUNTER — Telehealth: Payer: Self-pay | Admitting: Hematology and Oncology

## 2021-10-18 MED FILL — Fosaprepitant Dimeglumine For IV Infusion 150 MG (Base Eq): INTRAVENOUS | Qty: 5 | Status: AC

## 2021-10-18 MED FILL — Dexamethasone Sodium Phosphate Inj 100 MG/10ML: INTRAMUSCULAR | Qty: 1 | Status: AC

## 2021-10-18 NOTE — Telephone Encounter (Signed)
Patient aware of appointment changes, she stated it was okay to not see Provider with visit, if changed she will let the Nurse know.

## 2021-10-19 ENCOUNTER — Inpatient Hospital Stay: Payer: Medicare HMO | Admitting: Hematology and Oncology

## 2021-10-19 ENCOUNTER — Inpatient Hospital Stay: Payer: Medicare HMO

## 2021-10-19 ENCOUNTER — Other Ambulatory Visit (HOSPITAL_COMMUNITY): Payer: Self-pay

## 2021-10-19 ENCOUNTER — Other Ambulatory Visit: Payer: Self-pay

## 2021-10-19 VITALS — BP 125/73 | HR 86 | Temp 97.8°F | Resp 19 | Ht 62.0 in | Wt 142.5 lb

## 2021-10-19 DIAGNOSIS — C321 Malignant neoplasm of supraglottis: Secondary | ICD-10-CM | POA: Diagnosis not present

## 2021-10-19 DIAGNOSIS — C329 Malignant neoplasm of larynx, unspecified: Secondary | ICD-10-CM

## 2021-10-19 DIAGNOSIS — Z930 Tracheostomy status: Secondary | ICD-10-CM | POA: Diagnosis not present

## 2021-10-19 DIAGNOSIS — T451X5A Adverse effect of antineoplastic and immunosuppressive drugs, initial encounter: Secondary | ICD-10-CM | POA: Diagnosis not present

## 2021-10-19 DIAGNOSIS — C7989 Secondary malignant neoplasm of other specified sites: Secondary | ICD-10-CM | POA: Diagnosis not present

## 2021-10-19 DIAGNOSIS — Z95828 Presence of other vascular implants and grafts: Secondary | ICD-10-CM

## 2021-10-19 DIAGNOSIS — Z7901 Long term (current) use of anticoagulants: Secondary | ICD-10-CM | POA: Diagnosis not present

## 2021-10-19 DIAGNOSIS — E209 Hypoparathyroidism, unspecified: Secondary | ICD-10-CM | POA: Diagnosis not present

## 2021-10-19 DIAGNOSIS — D61810 Antineoplastic chemotherapy induced pancytopenia: Secondary | ICD-10-CM | POA: Diagnosis not present

## 2021-10-19 DIAGNOSIS — Z5111 Encounter for antineoplastic chemotherapy: Secondary | ICD-10-CM | POA: Diagnosis not present

## 2021-10-19 LAB — CBC WITH DIFFERENTIAL (CANCER CENTER ONLY)
Abs Immature Granulocytes: 0.01 10*3/uL (ref 0.00–0.07)
Basophils Absolute: 0 10*3/uL (ref 0.0–0.1)
Basophils Relative: 0 %
Eosinophils Absolute: 0.1 10*3/uL (ref 0.0–0.5)
Eosinophils Relative: 2 %
HCT: 32.4 % — ABNORMAL LOW (ref 36.0–46.0)
Hemoglobin: 10.7 g/dL — ABNORMAL LOW (ref 12.0–15.0)
Immature Granulocytes: 0 %
Lymphocytes Relative: 30 %
Lymphs Abs: 0.9 10*3/uL (ref 0.7–4.0)
MCH: 30 pg (ref 26.0–34.0)
MCHC: 33 g/dL (ref 30.0–36.0)
MCV: 90.8 fL (ref 80.0–100.0)
Monocytes Absolute: 0.4 10*3/uL (ref 0.1–1.0)
Monocytes Relative: 13 %
Neutro Abs: 1.5 10*3/uL — ABNORMAL LOW (ref 1.7–7.7)
Neutrophils Relative %: 55 %
Platelet Count: 168 10*3/uL (ref 150–400)
RBC: 3.57 MIL/uL — ABNORMAL LOW (ref 3.87–5.11)
RDW: 16.8 % — ABNORMAL HIGH (ref 11.5–15.5)
WBC Count: 2.8 10*3/uL — ABNORMAL LOW (ref 4.0–10.5)
nRBC: 0 % (ref 0.0–0.2)

## 2021-10-19 LAB — BASIC METABOLIC PANEL - CANCER CENTER ONLY
Anion gap: 8 (ref 5–15)
BUN: 29 mg/dL — ABNORMAL HIGH (ref 8–23)
CO2: 26 mmol/L (ref 22–32)
Calcium: 8.6 mg/dL — ABNORMAL LOW (ref 8.9–10.3)
Chloride: 103 mmol/L (ref 98–111)
Creatinine: 0.75 mg/dL (ref 0.44–1.00)
GFR, Estimated: >60 mL/min (ref >60)
Glucose, Bld: 97 mg/dL (ref 70–99)
Potassium: 4.2 mmol/L (ref 3.5–5.1)
Sodium: 137 mmol/L (ref 135–145)

## 2021-10-19 LAB — MAGNESIUM: Magnesium: 2.2 mg/dL (ref 1.7–2.4)

## 2021-10-19 MED ORDER — MAGNESIUM SULFATE 2 GM/50ML IV SOLN
2.0000 g | Freq: Once | INTRAVENOUS | Status: AC
  Administered 2021-10-19: 2 g via INTRAVENOUS
  Filled 2021-10-19: qty 50

## 2021-10-19 MED ORDER — SODIUM CHLORIDE 0.9% FLUSH
10.0000 mL | Freq: Once | INTRAVENOUS | Status: AC
  Administered 2021-10-19: 10 mL

## 2021-10-19 MED ORDER — POTASSIUM CHLORIDE IN NACL 20-0.9 MEQ/L-% IV SOLN
Freq: Once | INTRAVENOUS | Status: AC
  Filled 2021-10-19: qty 1000

## 2021-10-19 MED ORDER — SODIUM CHLORIDE 0.9 % IV SOLN
32.0000 mg/m2 | Freq: Once | INTRAVENOUS | Status: AC
  Administered 2021-10-19: 54 mg via INTRAVENOUS
  Filled 2021-10-19: qty 54

## 2021-10-19 MED ORDER — SODIUM CHLORIDE 0.9% FLUSH
10.0000 mL | INTRAVENOUS | Status: DC | PRN
  Administered 2021-10-19: 10 mL

## 2021-10-19 MED ORDER — SODIUM CHLORIDE 0.9 % IV SOLN
150.0000 mg | Freq: Once | INTRAVENOUS | Status: AC
  Administered 2021-10-19: 150 mg via INTRAVENOUS
  Filled 2021-10-19: qty 150

## 2021-10-19 MED ORDER — SODIUM CHLORIDE 0.9 % IV SOLN: Freq: Once | INTRAVENOUS | Status: AC

## 2021-10-19 MED ORDER — SODIUM CHLORIDE 0.9 % IV SOLN
10.0000 mg | Freq: Once | INTRAVENOUS | Status: AC
  Administered 2021-10-19: 10 mg via INTRAVENOUS
  Filled 2021-10-19: qty 10

## 2021-10-19 MED ORDER — HEPARIN SOD (PORK) LOCK FLUSH 100 UNIT/ML IV SOLN
500.0000 [IU] | Freq: Once | INTRAVENOUS | Status: AC | PRN
  Administered 2021-10-19: 500 [IU]

## 2021-10-19 MED ORDER — PALONOSETRON HCL INJECTION 0.25 MG/5ML
0.2500 mg | Freq: Once | INTRAVENOUS | Status: AC
  Administered 2021-10-19: 0.25 mg via INTRAVENOUS
  Filled 2021-10-19: qty 5

## 2021-10-19 NOTE — Patient Instructions (Signed)
[facility]  Discharge Instructions: Thank you for choosing [facility] to provide your oncology and hematology care.   If you have a lab appointment with the Cancer Center, please go directly to the Cancer Center and check in at the registration area.   Wear comfortable clothing and clothing appropriate for easy access to any Portacath or PICC line.   We strive to give you quality time with your provider. You may need to reschedule your appointment if you arrive late (15 or more minutes).  Arriving late affects you and other patients whose appointments are after yours.  Also, if you miss three or more appointments without notifying the office, you may be dismissed from the clinic at the provider's discretion.      For prescription refill requests, have your pharmacy contact our office and allow 72 hours for refills to be completed.    Today you received the following chemotherapy and/or immunotherapy agents cisplatin   To help prevent nausea and vomiting after your treatment, we encourage you to take your nausea medication as directed.  BELOW ARE SYMPTOMS THAT SHOULD BE REPORTED IMMEDIATELY: *FEVER GREATER THAN 100.4 F (38 C) OR HIGHER *CHILLS OR SWEATING *NAUSEA AND VOMITING THAT IS NOT CONTROLLED WITH YOUR NAUSEA MEDICATION *UNUSUAL SHORTNESS OF BREATH *UNUSUAL BRUISING OR BLEEDING *URINARY PROBLEMS (pain or burning when urinating, or frequent urination) *BOWEL PROBLEMS (unusual diarrhea, constipation, pain near the anus) TENDERNESS IN MOUTH AND THROAT WITH OR WITHOUT PRESENCE OF ULCERS (sore throat, sores in mouth, or a toothache) UNUSUAL RASH, SWELLING OR PAIN  UNUSUAL VAGINAL DISCHARGE OR ITCHING   Items with * indicate a potential emergency and should be followed up as soon as possible or go to the Emergency Department if any problems should occur.  Please show the CHEMOTHERAPY ALERT CARD or IMMUNOTHERAPY ALERT CARD at check-in to the  Emergency Department and triage nurse.  Should you have questions after your visit or need to cancel or reschedule your appointment, please contact [facility]  Dept: [phone]  and follow the prompts.  Office hours are 8:00 a.m. to 4:30 p.m. Monday - Friday. Please note that voicemails left after 4:00 p.m. may not be returned until the following business day.  We are closed weekends and major holidays. You have access to a nurse at all times for urgent questions. Please call the main number to the clinic Dept: [phone] and follow the prompts.   For any non-urgent questions, you may also contact your provider using MyChart. We now offer e-Visits for anyone 18 and older to request care online for non-urgent symptoms. For details visit [email or web address].   Also download the MyChart app! Go to the app store, search "MyChart", open the app, select [facility], and log in with your MyChart username and password.  Due to Covid, a mask is required upon entering the hospital/clinic. If you do not have a mask, one will be given to you upon arrival. For doctor visits, patients may have 1 support person aged 18 or older with them. For treatment visits, patients cannot have anyone with them due to current Covid guidelines and our immunocompromised population.   

## 2021-10-25 MED FILL — Fosaprepitant Dimeglumine For IV Infusion 150 MG (Base Eq): INTRAVENOUS | Qty: 5 | Status: AC

## 2021-10-25 MED FILL — Dexamethasone Sodium Phosphate Inj 100 MG/10ML: INTRAMUSCULAR | Qty: 1 | Status: AC

## 2021-10-26 ENCOUNTER — Other Ambulatory Visit: Payer: Self-pay

## 2021-10-26 ENCOUNTER — Inpatient Hospital Stay: Payer: Medicare HMO | Admitting: Hematology and Oncology

## 2021-10-26 ENCOUNTER — Inpatient Hospital Stay: Payer: Medicare HMO | Admitting: Dietician

## 2021-10-26 ENCOUNTER — Other Ambulatory Visit (HOSPITAL_COMMUNITY): Payer: Self-pay

## 2021-10-26 ENCOUNTER — Telehealth (HOSPITAL_COMMUNITY): Payer: Self-pay | Admitting: *Deleted

## 2021-10-26 ENCOUNTER — Inpatient Hospital Stay: Payer: Medicare HMO | Attending: Hematology

## 2021-10-26 ENCOUNTER — Encounter: Payer: Self-pay | Admitting: Hematology and Oncology

## 2021-10-26 ENCOUNTER — Inpatient Hospital Stay: Payer: Medicare HMO

## 2021-10-26 DIAGNOSIS — C7989 Secondary malignant neoplasm of other specified sites: Secondary | ICD-10-CM | POA: Diagnosis not present

## 2021-10-26 DIAGNOSIS — D61810 Antineoplastic chemotherapy induced pancytopenia: Secondary | ICD-10-CM | POA: Diagnosis not present

## 2021-10-26 DIAGNOSIS — Z930 Tracheostomy status: Secondary | ICD-10-CM | POA: Diagnosis not present

## 2021-10-26 DIAGNOSIS — C321 Malignant neoplasm of supraglottis: Secondary | ICD-10-CM | POA: Insufficient documentation

## 2021-10-26 DIAGNOSIS — D61818 Other pancytopenia: Secondary | ICD-10-CM | POA: Diagnosis not present

## 2021-10-26 DIAGNOSIS — C329 Malignant neoplasm of larynx, unspecified: Secondary | ICD-10-CM | POA: Diagnosis not present

## 2021-10-26 DIAGNOSIS — G893 Neoplasm related pain (acute) (chronic): Secondary | ICD-10-CM

## 2021-10-26 DIAGNOSIS — Z7901 Long term (current) use of anticoagulants: Secondary | ICD-10-CM | POA: Insufficient documentation

## 2021-10-26 DIAGNOSIS — T451X5D Adverse effect of antineoplastic and immunosuppressive drugs, subsequent encounter: Secondary | ICD-10-CM | POA: Insufficient documentation

## 2021-10-26 DIAGNOSIS — E559 Vitamin D deficiency, unspecified: Secondary | ICD-10-CM | POA: Diagnosis not present

## 2021-10-26 DIAGNOSIS — Z5111 Encounter for antineoplastic chemotherapy: Secondary | ICD-10-CM | POA: Insufficient documentation

## 2021-10-26 DIAGNOSIS — I513 Intracardiac thrombosis, not elsewhere classified: Secondary | ICD-10-CM | POA: Diagnosis not present

## 2021-10-26 DIAGNOSIS — Z95828 Presence of other vascular implants and grafts: Secondary | ICD-10-CM

## 2021-10-26 LAB — BASIC METABOLIC PANEL - CANCER CENTER ONLY
Anion gap: 9 (ref 5–15)
BUN: 26 mg/dL — ABNORMAL HIGH (ref 8–23)
CO2: 25 mmol/L (ref 22–32)
Calcium: 8.4 mg/dL — ABNORMAL LOW (ref 8.9–10.3)
Chloride: 103 mmol/L (ref 98–111)
Creatinine: 0.75 mg/dL (ref 0.44–1.00)
GFR, Estimated: >60 mL/min (ref >60)
Glucose, Bld: 95 mg/dL (ref 70–99)
Potassium: 4.2 mmol/L (ref 3.5–5.1)
Sodium: 137 mmol/L (ref 135–145)

## 2021-10-26 LAB — CBC WITH DIFFERENTIAL (CANCER CENTER ONLY)
Abs Immature Granulocytes: 0.04 10*3/uL (ref 0.00–0.07)
Basophils Absolute: 0 10*3/uL (ref 0.0–0.1)
Basophils Relative: 0 %
Eosinophils Absolute: 0.1 10*3/uL (ref 0.0–0.5)
Eosinophils Relative: 4 %
HCT: 31.4 % — ABNORMAL LOW (ref 36.0–46.0)
Hemoglobin: 10.8 g/dL — ABNORMAL LOW (ref 12.0–15.0)
Immature Granulocytes: 1 %
Lymphocytes Relative: 30 %
Lymphs Abs: 0.9 10*3/uL (ref 0.7–4.0)
MCH: 30.9 pg (ref 26.0–34.0)
MCHC: 34.4 g/dL (ref 30.0–36.0)
MCV: 90 fL (ref 80.0–100.0)
Monocytes Absolute: 0.4 10*3/uL (ref 0.1–1.0)
Monocytes Relative: 14 %
Neutro Abs: 1.5 10*3/uL — ABNORMAL LOW (ref 1.7–7.7)
Neutrophils Relative %: 51 %
Platelet Count: 143 10*3/uL — ABNORMAL LOW (ref 150–400)
RBC: 3.49 MIL/uL — ABNORMAL LOW (ref 3.87–5.11)
RDW: 16.9 % — ABNORMAL HIGH (ref 11.5–15.5)
WBC Count: 2.9 10*3/uL — ABNORMAL LOW (ref 4.0–10.5)
nRBC: 0 % (ref 0.0–0.2)

## 2021-10-26 LAB — MAGNESIUM: Magnesium: 2.2 mg/dL (ref 1.7–2.4)

## 2021-10-26 MED ORDER — HYDROCODONE-ACETAMINOPHEN 5-325 MG PO TABS
1.0000 | ORAL_TABLET | Freq: Four times a day (QID) | ORAL | 0 refills | Status: DC | PRN
  Filled 2021-10-26: qty 60, 15d supply, fill #0

## 2021-10-26 MED ORDER — SODIUM CHLORIDE 0.9 % IV SOLN
32.0000 mg/m2 | Freq: Once | INTRAVENOUS | Status: AC
  Administered 2021-10-26: 54 mg via INTRAVENOUS
  Filled 2021-10-26: qty 54

## 2021-10-26 MED ORDER — SODIUM CHLORIDE 0.9% FLUSH
10.0000 mL | INTRAVENOUS | Status: DC | PRN
  Administered 2021-10-26: 10 mL

## 2021-10-26 MED ORDER — SODIUM CHLORIDE 0.9 % IV SOLN
10.0000 mg | Freq: Once | INTRAVENOUS | Status: AC
  Administered 2021-10-26: 10 mg via INTRAVENOUS
  Filled 2021-10-26: qty 10

## 2021-10-26 MED ORDER — SODIUM CHLORIDE 0.9 % IV SOLN
150.0000 mg | Freq: Once | INTRAVENOUS | Status: AC
  Administered 2021-10-26: 150 mg via INTRAVENOUS
  Filled 2021-10-26: qty 150

## 2021-10-26 MED ORDER — SODIUM CHLORIDE 0.9% FLUSH
10.0000 mL | Freq: Once | INTRAVENOUS | Status: AC
  Administered 2021-10-26: 10 mL

## 2021-10-26 MED ORDER — HEPARIN SOD (PORK) LOCK FLUSH 100 UNIT/ML IV SOLN
500.0000 [IU] | Freq: Once | INTRAVENOUS | Status: AC | PRN
  Administered 2021-10-26: 500 [IU]

## 2021-10-26 MED ORDER — PALONOSETRON HCL INJECTION 0.25 MG/5ML
0.2500 mg | Freq: Once | INTRAVENOUS | Status: AC
  Administered 2021-10-26: 0.25 mg via INTRAVENOUS
  Filled 2021-10-26: qty 5

## 2021-10-26 MED ORDER — MAGNESIUM SULFATE 2 GM/50ML IV SOLN
2.0000 g | Freq: Once | INTRAVENOUS | Status: AC
  Administered 2021-10-26: 2 g via INTRAVENOUS
  Filled 2021-10-26: qty 50

## 2021-10-26 MED ORDER — POTASSIUM CHLORIDE IN NACL 20-0.9 MEQ/L-% IV SOLN
Freq: Once | INTRAVENOUS | Status: AC
  Filled 2021-10-26: qty 1000

## 2021-10-26 MED ORDER — SODIUM CHLORIDE 0.9 % IV SOLN: Freq: Once | INTRAVENOUS | Status: DC

## 2021-10-26 NOTE — Telephone Encounter (Signed)
Due to patient's speaking issues, I reached out to patient's partner, [name] [name], to offer assistance regarding upcoming cardiac imaging study; pt's partner verbalizes understanding of appt date/time, parking situation and where to check in, and verified current allergies; name and call back number provided for further questions should they arise  [facility] [phone] office [phone] cell  Denies metal or claustrophobia.

## 2021-10-26 NOTE — Assessment & Plan Note (Signed)
She has acquired pancytopenia due to treatment She is not symptomatic We will proceed with treatment without delay

## 2021-10-26 NOTE — Patient Instructions (Signed)
[facility]  Discharge Instructions: Thank you for choosing [facility] to provide your oncology and hematology care.   If you have a lab appointment with the Cancer Center, please go directly to the Cancer Center and check in at the registration area.   Wear comfortable clothing and clothing appropriate for easy access to any Portacath or PICC line.   We strive to give you quality time with your provider. You may need to reschedule your appointment if you arrive late (15 or more minutes).  Arriving late affects you and other patients whose appointments are after yours.  Also, if you miss three or more appointments without notifying the office, you may be dismissed from the clinic at the provider's discretion.      For prescription refill requests, have your pharmacy contact our office and allow 72 hours for refills to be completed.    Today you received the following chemotherapy and/or immunotherapy agents: Kyprolis    To help prevent nausea and vomiting after your treatment, we encourage you to take your nausea medication as directed.  BELOW ARE SYMPTOMS THAT SHOULD BE REPORTED IMMEDIATELY: . *FEVER GREATER THAN 100.4 F (38 C) OR HIGHER . *CHILLS OR SWEATING . *NAUSEA AND VOMITING THAT IS NOT CONTROLLED WITH YOUR NAUSEA MEDICATION . *UNUSUAL SHORTNESS OF BREATH . *UNUSUAL BRUISING OR BLEEDING . *URINARY PROBLEMS (pain or burning when urinating, or frequent urination) . *BOWEL PROBLEMS (unusual diarrhea, constipation, pain near the anus) . TENDERNESS IN MOUTH AND THROAT WITH OR WITHOUT PRESENCE OF ULCERS (sore throat, sores in mouth, or a toothache) . UNUSUAL RASH, SWELLING OR PAIN  . UNUSUAL VAGINAL DISCHARGE OR ITCHING   Items with * indicate a potential emergency and should be followed up as soon as possible or go to the Emergency Department if any problems should occur.  Please show the CHEMOTHERAPY ALERT CARD or IMMUNOTHERAPY ALERT  CARD at check-in to the Emergency Department and triage nurse.  Should you have questions after your visit or need to cancel or reschedule your appointment, please contact [facility]  Dept: [phone]  and follow the prompts.  Office hours are 8:00 a.m. to 4:30 p.m. Monday - Friday. Please note that voicemails left after 4:00 p.m. may not be returned until the following business day.  We are closed weekends and major holidays. You have access to a nurse at all times for urgent questions. Please call the main number to the clinic Dept: [phone] and follow the prompts.   For any non-urgent questions, you may also contact your provider using MyChart. We now offer e-Visits for anyone 18 and older to request care online for non-urgent symptoms. For details visit [email or web address].   Also download the MyChart app! Go to the app store, search "MyChart", open the app, select [facility], and log in with your MyChart username and password.  Due to Covid, a mask is required upon entering the hospital/clinic. If you do not have a mask, one will be given to you upon arrival. For doctor visits, patients may have 1 support person aged 18 or older with them. For treatment visits, patients cannot have anyone with them due to current Covid guidelines and our immunocompromised population.   

## 2021-10-26 NOTE — Assessment & Plan Note (Signed)
She will continue pain medicine taper as tolerated I have refilled her prescription pain medicine today

## 2021-10-26 NOTE — Assessment & Plan Note (Signed)
I recommend cardiac MRI next week for objective assessment of response to treatment So far, she tolerated treatment well except for mild pancytopenia We will proceed with treatment without delay

## 2021-10-26 NOTE — Progress Notes (Signed)
[place] OFFICE PROGRESS NOTE  Patient Care Team: Hamrick, Lorin Mercy, MD as PCP - General (Family Medicine) Heath Lark, MD as Consulting Physician (Hematology and Oncology)  ASSESSMENT & PLAN:  Laryngeal squamous cell carcinoma ([place]) I recommend cardiac MRI next week for objective assessment of response to treatment So far, she tolerated treatment well except for mild pancytopenia We will proceed with treatment without delay  Pancytopenia, acquired [facility]) She has acquired pancytopenia due to treatment She is not symptomatic We will proceed with treatment without delay  RV (right ventricular) mural thrombus She is doing well with anticoagulation therapy We will continue the same  Cancer associated pain She will continue pain medicine taper as tolerated I have refilled her prescription pain medicine today  No orders of the defined types were placed in this encounter.   All questions were answered. The patient knows to call the clinic with any problems, questions or concerns. The total time spent in the appointment was 20 minutes encounter with patients including review of chart and various tests results, discussions about plan of care and coordination of care plan   Heath Lark, MD 10/26/2021 10:14 AM  INTERVAL HISTORY: Please see below for problem oriented charting. she returns for treatment follow-up with her son She is scheduled for MRI next week She denies bleeding from her tracheostomy site No peripheral neuropathy She has occasional nausea, resolved with antiemetics  REVIEW OF SYSTEMS:   Constitutional: Denies fevers, chills or abnormal weight loss Eyes: Denies blurriness of vision Ears, nose, mouth, throat, and face: Denies mucositis or sore throat Respiratory: Denies cough, dyspnea or wheezes Cardiovascular: Denies palpitation, chest discomfort or lower extremity swelling Gastrointestinal:  Denies nausea, heartburn or change in bowel habits Skin:  Denies abnormal skin rashes Lymphatics: Denies new lymphadenopathy or easy bruising Neurological:Denies numbness, tingling or new weaknesses Behavioral/Psych: Mood is stable, no new changes  All other systems were reviewed with the patient and are negative.  I have reviewed the past medical history, past surgical history, social history and family history with the patient and they are unchanged from previous note.  ALLERGIES:  has no active allergies.  MEDICATIONS:  Current Outpatient Medications  Medication Sig Dispense Refill   apixaban (ELIQUIS) 2.5 MG TABS tablet Take 1 tablet (2.5 mg total) by mouth 2 (two) times daily. 60 tablet 3   apixaban (ELIQUIS) 2.5 MG TABS tablet Take 1 tablet (2.5 mg total) by mouth 2 (two) times daily. 60 tablet 3   Cholecalciferol (VITAMIN D3) 125 MCG (5000 UT) CAPS Take 5,000 Units by mouth daily.     HYDROcodone-acetaminophen (NORCO/VICODIN) 5-325 MG tablet TAKE 1 TABLET BY MOUTH EVERY 6 HOURS AS NEEDED FOR MODERATE PAIN 60 tablet 0   levothyroxine (SYNTHROID) 137 MCG tablet Take 137 mcg by mouth daily before breakfast.     lidocaine-prilocaine (EMLA) cream Apply topically daily as needed. 30 g 9   Nutritional Supplements (KATE FARMS PEPTIDE 1.5) LIQD Take 4 Bottles by mouth daily. 325 mL 11   ondansetron (ZOFRAN) 8 MG tablet Take 1 tablet (8 mg total) by mouth every 8 (eight) hours as needed for nausea. 30 tablet 3   oxymetazoline (AFRIN) 0.05 % nasal spray Place 3 sprays into both nostrils 2 (two) times daily as needed (Bleeding from stoma). 30 mL 0   prochlorperazine (COMPAZINE) 10 MG tablet Take 1 tablet (10 mg total) by mouth every 6 (six) hours as needed for nausea or vomiting. 30 tablet 1   No current facility-administered medications  for this visit.   Facility-Administered Medications Ordered in Other Visits  Medication Dose Route Frequency Provider Last Rate Last Admin   0.9 %  sodium chloride infusion   Intravenous Once Alvy Bimler, Chritopher Coster, MD        CISplatin (PLATINOL) 54 mg in sodium chloride 0.9 % 250 mL chemo infusion  32 mg/m2 (Treatment Plan Recorded) Intravenous Once Alvy Bimler, Merdith Boyd, MD       dexamethasone (DECADRON) 10 mg in sodium chloride 0.9 % 50 mL IVPB  10 mg Intravenous Once Alvy Bimler, Brandy Kabat, MD       fosaprepitant (EMEND) 150 mg in sodium chloride 0.9 % 145 mL IVPB  150 mg Intravenous Once Alvy Bimler, Delene Morais, MD       heparin lock flush 100 unit/mL  500 Units Intracatheter Once PRN Alvy Bimler, Dona Walby, MD       magnesium sulfate IVPB 2 g 50 mL  2 g Intravenous Once Alvy Bimler, Azarian Starace, MD       palonosetron (ALOXI) injection 0.25 mg  0.25 mg Intravenous Once Annsleigh Dragoo, MD       sodium chloride flush (NS) 0.9 % injection 10 mL  10 mL Intracatheter PRN Heath Lark, MD        SUMMARY OF ONCOLOGIC HISTORY: Oncology History  Laryngeal squamous cell carcinoma ([place])  12/02/2018 Imaging   CT neck: IMPRESSION: 1. Supraglottic laryngeal carcinoma extending across the anterior commissure and invading the prelaryngeal fat, left aryepiglottic fold and left aspect of the epiglottis. 2. Severe narrowing of the laryngeal airway. 3. Bilateral subcentimeter level 2A and 2A cervical lymph nodes.     12/02/2018 Imaging   CT  Chest:  IMPRESSION: No evidence of metastatic disease in the chest.   12/03/2018 Pathology Results   (Care Everywhere; [facility]) A: Larynx, supraglottis, biopsy - Atypical squamous proliferation with at least low grade dysplasia and abundant keratinization - No definite high grade dysplasia or invasive carcinoma represented (see comment)   12/18/2018 Pathology Results   (Care Everywhere; [facility]) A:  Neck, left, fine needle aspiration - Rare single atypical cells, no cells diagnostic of malignancy - Lymph node material present  B:  Neck, right, fine needle aspiration - Non-diagnostic specimen - No lymph node material identified - Mixed inflammation and blood   12/31/2018 Pathology Results   (Care Everywhere; [facility])  A:  Larynx, left supraglottic mass, biopsy - Atypical squamous proliferation with abundant keratinization, suspicious for well-differentiated invasive squamous cell carcinoma  B: Larynx, left supraglottic mass, biopsy - Well-differentiated invasive squamous cell carcinoma with abundant keratinization (see comment)   01/18/2019 Initial Diagnosis   Laryngeal cancer ([place])   01/26/2019 Imaging   1. Locally advanced hypermetabolic laryngeal neoplasm centered in the left supraglottic space with bilateral glottic and epiglottic involvement and subglottic extension on the left. 2. Hypermetabolic bilateral level 3 neck nodal metastases. 3. Hypermetabolic high mediastinal nodal metastases between the trachea and upper thoracic esophagus. 4. Otherwise no distant hypermetabolic metastatic disease. 5. Nodular 7 mm focus at the left lung base, below PET resolution, recommend attention on follow-up chest CT in 3 months. 6. Chronic findings include: Aortic Atherosclerosis (ICD10-I70.0). Cholelithiasis.   02/08/2019 Cancer Staging   Staging form: Larynx - Supraglottis, AJCC 8th Edition - Clinical stage from 02/08/2019: Stage IVA (cT3, cN2c, cM0) - Signed by Eppie Gibson, MD on 02/08/2019    02/11/2019 - 04/07/2019 Chemotherapy   The patient weekly cisplatin for chemotherapy treatment.     07/20/2019 Pathology Results   PROCEDURE: Total glossectomy, total pharyngectomy, total laryngectomy, total  thyroidectomy, and cervical esophagectomy TUMOR SITE: Larynx TUMOR LATERALITY: Not specified TUMOR FOCALITY: Unifocal TUMOR SIZE:    GREATEST DIMENSION: 6.9 cm    ADDITIONAL DIMENSIONS: 5.4 x 2.4 cm HISTOLOGIC TYPE: Squamous cell carcinoma, conventional (keratinizing) HISTOLOGIC GRADE:  G1-G2: Well to moderately differentiated TUMOR EXTENSION: Tumor involves the posterior tongue, pharynx, larynx, tracheal cartilage, hyoid bone, cervical esophagus, vagus nerve, carotid artery, deep cervical fascia, ans  surrounding skeletal muscle and soft tissue MARGINS: Uninvolved by invasive tumor; Perineural invasion is less than 15mm from the left soft tissue margin LYMPHOVASCULAR INVASION: Not identified PERINEURAL INVASION:  Present REGIONAL LYMPH NODES:     NUMBER OF LYMPH NODES INVOLVED: 2     NUMBER OF LYMPH NODES EXAMINED: 24     LATERALITY OF LYMPH NODES INVOLVED: Cannot determine     SIZE OF LARGEST METASTATIC DEPOSIT: 0.8 cm     EXTRANODAL EXTENSION: Not identified PATHOLOGIC STAGE CLASSIFICATION (pTNM, AJCC 8TH Ed): (r)(y) pT4b pN2a DISTANT METASTASIS (pM): ADDITIONAL PATHOLOGIC FINDINGS: Previous chemoradiation   11/04/2019 Imaging   PET: 1.  Hypermetabolic bilateral neck lesions as detailed above are concerning for disease recurrence. 2.  Compared to 07/08/2019 PET scan, interval development of subcentimeter bilateral upper lobe pulmonary nodules, size below PET resolution. These are concerning for metastasis. Consider diagnostic quality CT chest exam for further evaluation as per oncology treatment planning. 3.  Focal hypermetabolic uptake in right ventricular cavity of indeterminate significance. Recommend echocardiographic examination to rule out underlying thrombus. This finding was discussed with Dr. Conley Canal by Dr. Clementeen Graham and Dr. Marcello Moores on 11/04/2019 at approximately 3:00 PM via telephone. 4.  Ancillary CT findings as above.   11/05/2019 Imaging   Echocardiogram: Summary   1. The left ventricle is normal in size with normal wall thickness.   2. The left ventricular systolic function is normal, LVEF is visually estimated at 60-65%.   3. The right ventricle is normal in size, with normal systolic function.   4. There is an extrinsic compression of the RV and RA laterally at the AV groove by a mass of 4.3 cm diameter. Unclear if the mass is invasive, although no pericardial effusion seen suggestive no invasion possibly. There is no evidence of hemodynamic compromise secondary to  cardiac compression with normal IVC diameter.   12/06/2019 Pathology Results   A. LYMPH NODE, LEFT SUBMANDIBULAR, BIOPSY:  - Squamous cell carcinoma.  - No distinct nodal tissue identified.    12/06/2019 Procedure   Successful ultrasound-guided core biopsy of centrally necrotic left submandibular lymph node. Of note, due to extensive prior postsurgical and post radiation changes, patient has very limited mobility in the neck and the biopsy was extremely challenging    12/16/2019 -  Chemotherapy   The patient had pembrolizumab for chemotherapy treatment.     03/08/2020 PET scan   1. Diminished size of masses in the neck as described. Persistent increased metabolic activity particularly in the LEFT neck. Some of this diminished size on the LEFT could be due to decompression of necrotic material. Correlate with any symptoms of inflammation or infection in this area as well given the paucity of residual soft tissue associated with the above abnormality, overall findings are compatible with residual disease in this location and there is no significant surrounding stranding to indicate inflammation that would correlate with the degree of FDG uptake remaining. 2. Diminished activity in the juxta cardiac mass with peripheral activity suggesting interval necrosis, on the prior study this was uniformly hypermetabolic. There is still considerable FDG uptake.  3. No signs of disease in the abdomen or pelvis. 4. 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 Correlate with any new pain or symptoms in this area with further imaging as warranted. No discrete mass or bone destruction on today's study.   06/08/2020 Imaging   CT neck 1. Status post laryngectomy and neck dissection without lymphadenopathy or mass lesion. 2. Short segment severe stenosis of the left internal carotid artery adjacent to a surgical clip.     06/08/2020 Imaging   CT chest 1. There is a large, hypodense lesion within, or very closely  abutting and likely intruding upon the right ventricle, as seen on prior examination and approximately 4.6 x 4.3 cm, poorly evaluated due to cardiac motion. Findings are consistent with metastatic lesion and/or thrombus and poorly assessed by ungated CT. Consider echocardiography and cardiac MRI for further assessment.   2. Multiple small pulmonary nodules in the bilateral lung apices are unchanged. Attention on follow-up.   3.  No evidence of new metastatic disease in the chest.   4.  Aortic Atherosclerosis (ICD10-I70.0).     09/07/2020 Imaging   1. Mild decrease in size of juxta cardiac metastatic involving the right ventricle. 2. Small pulmonary nodules are unchanged from previous exam. 3. Aortic atherosclerosis. 4. Gallstone.     12/14/2020 Procedure   Successful exchange and up size from 20 French pull-through gastrostomy to 22 French balloon retention gastrostomy.   03/30/2021 Imaging   Ct neck 1.  In comparison with PET/CT from February 2021, marked interval decrease in size of soft tissue mass along the angle of the left mandible consistent with a positive treatment response. However, there are new destructive bony changes along the lingual surface of the mandible in this region with appearance of soft tissue infiltrating the marrow cavity. While concerning for neoplastic involvement, changes could also represent other nonspecific osteonecrosis including infection or sequela of prior radiation treatment. Recommend correlation with direct visual inspection. Further evaluation with MRI may be of value.  2.  Centrally necrotic 1.3 cm right level IIa lymph node corresponding with previously described hypermetabolic lesion, decreased in size from prior. No suspicious new or enlarged lymph nodes in the neck.  3.  Enlarged right lower paratracheal lymph node suspected in the mediastinum, incompletely imaged. Consider dedicated CT imaging of the chest.  4.  Scattered pulmonary nodules in the  imaged lung apices, the largest a 9 mm lesion in the right upper lobe. Recommend comparison with outside imaging ofthe chest if available.   05/03/2021 Echocardiogram    1. Left ventricular ejection fraction, by estimation, is 55 to 60%. The left ventricle has normal function. The left ventricle has no regional wall motion abnormalities. There is mild left ventricular hypertrophy. Left ventricular diastolic parameters are consistent with Grade I diastolic dysfunction (impaired relaxation).  2. Prominent echogenic structure on the RV free wall, possibly attached to the TV - measuring about 2.0 cm in diameter and 3.0 cm in length - some mobile filamentous material is attached, suggestive of thrombus or less likely tumor.. Right ventricular systolic function is normal. The right ventricular size is normal.  3. The mitral valve is grossly normal. Trivial mitral valve regurgitation.  4. The aortic valve is tricuspid. Aortic valve regurgitation is not visualized.     05/04/2021 Imaging   RIGHT: - There is no evidence of deep vein thrombosis in the lower extremity.   - No cystic structure found in the popliteal fossa.   LEFT: - There is no evidence of  deep vein thrombosis in the lower extremity.   - No cystic structure found in the popliteal fossa.     07/12/2021 Imaging   1. A large, heterogeneous mass about and within the right ventricle is difficult to clearly assess due to cardiac motion and mixing artifact on this non tailored examination, but suspect it has significantly increased in size, and now involves the right atrium in addition to the right ventricle and pulmonary outflow tract, measuring approximately 6.9 x 4.9 cm, previously 4.5 x 4.2 cm when measured similarly. This may reflect a combination of metastatic soft tissue and thrombus, and could be more clearly assessed by echocardiography and cardiac MRI. 2. No significant change of a right upper lobe pulmonary nodule, measuring 0.8 cm,  which remains modestly suspicious for a metastasis. Other small nodules are likewise stable. Attention on follow-up. 3. Status post laryngectomy and tracheostomy. 4. Coronary artery disease.   07/24/2021 Echocardiogram    1. There is an echogenic RV and RVOT mass. Attachment appears to be on the right ventricular mid wall. Mass is 32 by 24 mm, though may be underestimated. Right ventricular systolic function is normal. The right ventricular size is normal. There is normal pulmonary artery systolic pressure.  2. Left ventricular ejection fraction, by estimation, is 60 to 65%. The left ventricle has normal function. The left ventricle has no regional wall motion abnormalities. Left ventricular diastolic parameters are consistent with Grade I diastolic dysfunction (impaired relaxation). The average left ventricular global longitudinal strain is -19.0 %. The global longitudinal strain is normal.  3. The mitral valve is normal in structure. No evidence of mitral valve regurgitation.  4. The aortic valve is tricuspid. Aortic valve regurgitation is not visualized. No aortic stenosis is present.   Comparison(s): A prior study was performed on 05/03/21. Prior images reviewed side by side. Similar size of RV mass to slight increase. Do not see right atrial involvement in this study.   Conclusion(s)/Recommendation(s): If concerned for tumor vs thombus, cardiac MRI could be considered   08/02/2021 Imaging   1. Large RV mass measuring 10mm x 38mm x 36mm. Mass is isointense to myocardium on T1 weighted imaging, does not suppress with fat saturation, isointense on T2 weighted imaging, perfuses on first pass perfusion, and heterogenous enhancement on LGE imaging. This is consistent with tumor, and in setting of known metastatic laryngeal cancer, is consistent with metastasis. There does appear to be thrombus adherent to tumor   2. Normal LV size and systolic function (EF 67%). No late gadolinium enhancement to  suggest myocardial scar   3.  Normal RV size with mild systolic dysfunction (EF 12%)   08/17/2021 -  Chemotherapy   Patient is on Treatment Plan : HEAD/NECK Cisplatin q7d       PHYSICAL EXAMINATION: ECOG PERFORMANCE STATUS: 1 - Symptomatic but completely ambulatory  Vitals:   10/26/21 0927  BP: 115/70  Pulse: 65  Resp: 18  Temp: 97.6 F (36.4 C)  SpO2: 94%   Filed Weights   10/26/21 0927  Weight: 140 lb (63.5 kg)    GENERAL:alert, no distress and comfortable SKIN: skin color, texture, turgor are normal, no rashes or significant lesions EYES: normal, Conjunctiva are pink and non-injected, sclera clear OROPHARYNX:no exudate, no erythema and lips, buccal mucosa, and tongue normal  NECK: Tracheostomy in situ LYMPH:  no palpable lymphadenopathy in the cervical, axillary or inguinal LUNGS: clear to auscultation and percussion with normal breathing effort HEART: regular rate & rhythm and no murmurs and no  lower extremity edema ABDOMEN:abdomen soft, non-tender and normal bowel sounds Musculoskeletal:no cyanosis of digits and no clubbing  NEURO: alert & oriented x 3 no focal motor/sensory deficits  LABORATORY DATA:  I have reviewed the data as listed    Component Value Date/Time   NA 137 10/26/2021 0907   K 4.2 10/26/2021 0907   CL 103 10/26/2021 0907   CO2 25 10/26/2021 0907   GLUCOSE 95 10/26/2021 0907   BUN 26 (H) 10/26/2021 0907   CREATININE 0.75 10/26/2021 0907   CALCIUM 8.4 (L) 10/26/2021 0907   PROT 7.4 08/14/2021 0905   ALBUMIN 3.9 08/14/2021 0905   AST 19 08/14/2021 0905   ALT 16 08/14/2021 0905   ALKPHOS 72 08/14/2021 0905   BILITOT 0.3 08/14/2021 0905   GFRNONAA >60 10/26/2021 0907   GFRAA >60 06/08/2020 1303   GFRAA >60 02/17/2020 1128    No results found for: SPEP, UPEP  Lab Results  Component Value Date   WBC 2.9 (L) 10/26/2021   NEUTROABS 1.5 (L) 10/26/2021   HGB 10.8 (L) 10/26/2021   HCT 31.4 (L) 10/26/2021   MCV 90.0 10/26/2021   PLT 143  (L) 10/26/2021      Chemistry      Component Value Date/Time   NA 137 10/26/2021 0907   K 4.2 10/26/2021 0907   CL 103 10/26/2021 0907   CO2 25 10/26/2021 0907   BUN 26 (H) 10/26/2021 0907   CREATININE 0.75 10/26/2021 0907      Component Value Date/Time   CALCIUM 8.4 (L) 10/26/2021 0907   ALKPHOS 72 08/14/2021 0905   AST 19 08/14/2021 0905   ALT 16 08/14/2021 0905   BILITOT 0.3 08/14/2021 0905

## 2021-10-26 NOTE — Progress Notes (Signed)
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 She is currently receiving cisplatin q7 days (2 weeks on 1 week off to allow for blood count recovery)  Met with patient during infusion. She reports increased fatigue lasting 3 days after chemotherapy. She mostly sleeps during this time. Patient reports not feeling hungry, forces herself to get in 4 cartons of [employer] on those day. Patient is able to increase to goal of 5 cartons daily starting on day 4 following treatment. Patient is adding 1/3 of a Boost Plus to her coffee in the morning. Patient continues to consume 6 (8oz) bottles of water bother orally and via tube. She denies nausea, vomiting, diarrhea, constipation.   Medications: reviewed   Labs: BUN 26  Anthropometrics: Weight 140 lb today   1/13 - 142 lb 1/6 - 143 lb  12/23 - 141 lb 12/16 - 144 lb 3.2 oz 11/25 - 147 lb 11/3 - 144 lb 6.4 oz   NUTRITION DIAGNOSIS: Food and nutrition related knowledge deficit improved    INTERVENTION:  Discussed strategies to meet tube feeding goal of 5 cartons/day via tube - pt will try splitting one carton over 4 feedings to reach goal    MONITORING, EVALUATION, GOAL: weight trends, intake, tube feeding   NEXT VISIT: To be scheduled with treatment

## 2021-10-26 NOTE — Assessment & Plan Note (Signed)
She is doing well with anticoagulation therapy We will continue the same

## 2021-10-29 ENCOUNTER — Other Ambulatory Visit: Payer: Self-pay

## 2021-10-29 ENCOUNTER — Ambulatory Visit (HOSPITAL_COMMUNITY)
Admission: RE | Admit: 2021-10-29 | Discharge: 2021-10-29 | Disposition: A | Discharge status: Home / Self Care | Payer: Medicare HMO | Source: Ambulatory Visit | Attending: Hematology and Oncology | Admitting: Hematology and Oncology

## 2021-10-29 DIAGNOSIS — C329 Malignant neoplasm of larynx, unspecified: Secondary | ICD-10-CM | POA: Insufficient documentation

## 2021-10-29 DIAGNOSIS — I5189 Other ill-defined heart diseases: Secondary | ICD-10-CM | POA: Insufficient documentation

## 2021-10-29 MED ORDER — GADOBUTROL 1 MMOL/ML IV SOLN
7.0000 mL | Freq: Once | INTRAVENOUS | Status: AC | PRN
  Administered 2021-10-29: 7 mL via INTRAVENOUS

## 2021-11-02 ENCOUNTER — Encounter: Payer: Self-pay | Admitting: Hematology and Oncology

## 2021-11-02 ENCOUNTER — Other Ambulatory Visit: Payer: Self-pay

## 2021-11-02 ENCOUNTER — Inpatient Hospital Stay: Payer: Medicare HMO | Admitting: Hematology and Oncology

## 2021-11-02 DIAGNOSIS — C7800 Secondary malignant neoplasm of unspecified lung: Secondary | ICD-10-CM

## 2021-11-02 DIAGNOSIS — Z930 Tracheostomy status: Secondary | ICD-10-CM | POA: Diagnosis not present

## 2021-11-02 DIAGNOSIS — C7989 Secondary malignant neoplasm of other specified sites: Secondary | ICD-10-CM | POA: Diagnosis not present

## 2021-11-02 DIAGNOSIS — I513 Intracardiac thrombosis, not elsewhere classified: Secondary | ICD-10-CM | POA: Diagnosis not present

## 2021-11-02 DIAGNOSIS — T451X5D Adverse effect of antineoplastic and immunosuppressive drugs, subsequent encounter: Secondary | ICD-10-CM | POA: Diagnosis not present

## 2021-11-02 DIAGNOSIS — G893 Neoplasm related pain (acute) (chronic): Secondary | ICD-10-CM

## 2021-11-02 DIAGNOSIS — C321 Malignant neoplasm of supraglottis: Secondary | ICD-10-CM | POA: Diagnosis not present

## 2021-11-02 DIAGNOSIS — E559 Vitamin D deficiency, unspecified: Secondary | ICD-10-CM | POA: Diagnosis not present

## 2021-11-02 DIAGNOSIS — D61818 Other pancytopenia: Secondary | ICD-10-CM

## 2021-11-02 DIAGNOSIS — Z5111 Encounter for antineoplastic chemotherapy: Secondary | ICD-10-CM | POA: Diagnosis not present

## 2021-11-02 DIAGNOSIS — D61810 Antineoplastic chemotherapy induced pancytopenia: Secondary | ICD-10-CM | POA: Diagnosis not present

## 2021-11-02 DIAGNOSIS — C329 Malignant neoplasm of larynx, unspecified: Secondary | ICD-10-CM | POA: Diagnosis not present

## 2021-11-02 DIAGNOSIS — Z7901 Long term (current) use of anticoagulants: Secondary | ICD-10-CM | POA: Diagnosis not present

## 2021-11-02 NOTE — Progress Notes (Signed)
[place] OFFICE PROGRESS NOTE  Patient Care Team: Hamrick, Lorin Mercy, MD as PCP - General (Family Medicine) Heath Lark, MD as Consulting Physician (Hematology and Oncology)  ASSESSMENT & PLAN:  Laryngeal squamous cell carcinoma ([place]) MRI so far shows stable disease We discussed the risk and benefits of switching treatment versus continuing treatment for another few more months So far, she tolerated treatment well except for mild pancytopenia Ultimately, after long discussion, she would like to continue treatment for another few months I plan to order repeat MRI in May of this year  Pancytopenia, acquired [facility]) She has acquired pancytopenia due to treatment She is not symptomatic We will proceed with treatment without delay, as long as her [place] is greater than 1.0  RV (right ventricular) mural thrombus She will continue anticoagulation therapy to reduce risk of embolic phenomena  Cancer associated pain She has stable pain control She will continue the same  Metastasis to lung [facility]) I plan to add CT imaging of the chest to assess and may So far, she have no clinical signs or symptoms of cough, chest pain or shortness of breath  No orders of the defined types were placed in this encounter.   All questions were answered. The patient knows to call the clinic with any problems, questions or concerns. The total time spent in the appointment was 30 minutes encounter with patients including review of chart and various tests results, discussions about plan of care and coordination of care plan   Heath Lark, MD 11/02/2021 10:14 AM  INTERVAL HISTORY: Please see below for problem oriented charting. she returns for treatment follow-up with her son to review results of MRI She tolerated last cycle of treatment well Denies mucositis, nausea, hearing deficit or peripheral neuropathy She has no recent bleeding complications from anticoagulation therapy Denies recent chest  pain or shortness of breath  REVIEW OF SYSTEMS:   Constitutional: Denies fevers, chills or abnormal weight loss Eyes: Denies blurriness of vision Ears, nose, mouth, throat, and face: Denies mucositis or sore throat Respiratory: Denies cough, dyspnea or wheezes Cardiovascular: Denies palpitation, chest discomfort or lower extremity swelling Gastrointestinal:  Denies nausea, heartburn or change in bowel habits Skin: Denies abnormal skin rashes Lymphatics: Denies new lymphadenopathy or easy bruising Neurological:Denies numbness, tingling or new weaknesses Behavioral/Psych: Mood is stable, no new changes  All other systems were reviewed with the patient and are negative.  I have reviewed the past medical history, past surgical history, social history and family history with the patient and they are unchanged from previous note.  ALLERGIES:  has no active allergies.  MEDICATIONS:  Current Outpatient Medications  Medication Sig Dispense Refill   apixaban (ELIQUIS) 2.5 MG TABS tablet Take 1 tablet (2.5 mg total) by mouth 2 (two) times daily. 60 tablet 3   apixaban (ELIQUIS) 2.5 MG TABS tablet Take 1 tablet (2.5 mg total) by mouth 2 (two) times daily. 60 tablet 3   Cholecalciferol (VITAMIN D3) 125 MCG (5000 UT) CAPS Take 5,000 Units by mouth daily.     HYDROcodone-acetaminophen (NORCO/VICODIN) 5-325 MG tablet TAKE 1 TABLET BY MOUTH EVERY 6 HOURS AS NEEDED FOR MODERATE PAIN 60 tablet 0   levothyroxine (SYNTHROID) 137 MCG tablet Take 137 mcg by mouth daily before breakfast.     lidocaine-prilocaine (EMLA) cream Apply topically daily as needed. 30 g 9   Nutritional Supplements (KATE FARMS PEPTIDE 1.5) LIQD Take 4 Bottles by mouth daily. 325 mL 11   ondansetron (ZOFRAN) 8 MG tablet Take  1 tablet (8 mg total) by mouth every 8 (eight) hours as needed for nausea. 30 tablet 3   oxymetazoline (AFRIN) 0.05 % nasal spray Place 3 sprays into both nostrils 2 (two) times daily as needed (Bleeding from  stoma). 30 mL 0   prochlorperazine (COMPAZINE) 10 MG tablet Take 1 tablet (10 mg total) by mouth every 6 (six) hours as needed for nausea or vomiting. 30 tablet 1   No current facility-administered medications for this visit.    SUMMARY OF ONCOLOGIC HISTORY: Oncology History  Laryngeal squamous cell carcinoma ([place])  12/02/2018 Imaging   CT neck: IMPRESSION: 1. Supraglottic laryngeal carcinoma extending across the anterior commissure and invading the prelaryngeal fat, left aryepiglottic fold and left aspect of the epiglottis. 2. Severe narrowing of the laryngeal airway. 3. Bilateral subcentimeter level 2A and 2A cervical lymph nodes.     12/02/2018 Imaging   CT  Chest:  IMPRESSION: No evidence of metastatic disease in the chest.   12/03/2018 Pathology Results   (Care Everywhere; [facility]) A: Larynx, supraglottis, biopsy - Atypical squamous proliferation with at least low grade dysplasia and abundant keratinization - No definite high grade dysplasia or invasive carcinoma represented (see comment)   12/18/2018 Pathology Results   (Care Everywhere; [facility]) A:  Neck, left, fine needle aspiration - Rare single atypical cells, no cells diagnostic of malignancy - Lymph node material present  B:  Neck, right, fine needle aspiration - Non-diagnostic specimen - No lymph node material identified - Mixed inflammation and blood   12/31/2018 Pathology Results   (Care Everywhere; [facility])  A: Larynx, left supraglottic mass, biopsy - Atypical squamous proliferation with abundant keratinization, suspicious for well-differentiated invasive squamous cell carcinoma  B: Larynx, left supraglottic mass, biopsy - Well-differentiated invasive squamous cell carcinoma with abundant keratinization (see comment)   01/18/2019 Initial Diagnosis   Laryngeal cancer ([place])   01/26/2019 Imaging   1. Locally advanced hypermetabolic laryngeal neoplasm centered in the left supraglottic space with  bilateral glottic and epiglottic involvement and subglottic extension on the left. 2. Hypermetabolic bilateral level 3 neck nodal metastases. 3. Hypermetabolic high mediastinal nodal metastases between the trachea and upper thoracic esophagus. 4. Otherwise no distant hypermetabolic metastatic disease. 5. Nodular 7 mm focus at the left lung base, below PET resolution, recommend attention on follow-up chest CT in 3 months. 6. Chronic findings include: Aortic Atherosclerosis (ICD10-I70.0). Cholelithiasis.   02/08/2019 Cancer Staging   Staging form: Larynx - Supraglottis, AJCC 8th Edition - Clinical stage from 02/08/2019: Stage IVA (cT3, cN2c, cM0) - Signed by Eppie Gibson, MD on 02/08/2019    02/11/2019 - 04/07/2019 Chemotherapy   The patient weekly cisplatin for chemotherapy treatment.     07/20/2019 Pathology Results   PROCEDURE: Total glossectomy, total pharyngectomy, total laryngectomy, total thyroidectomy, and cervical esophagectomy TUMOR SITE: Larynx TUMOR LATERALITY: Not specified TUMOR FOCALITY: Unifocal TUMOR SIZE:    GREATEST DIMENSION: 6.9 cm    ADDITIONAL DIMENSIONS: 5.4 x 2.4 cm HISTOLOGIC TYPE: Squamous cell carcinoma, conventional (keratinizing) HISTOLOGIC GRADE:  G1-G2: Well to moderately differentiated TUMOR EXTENSION: Tumor involves the posterior tongue, pharynx, larynx, tracheal cartilage, hyoid bone, cervical esophagus, vagus nerve, carotid artery, deep cervical fascia, ans surrounding skeletal muscle and soft tissue MARGINS: Uninvolved by invasive tumor; Perineural invasion is less than 76mm from the left soft tissue margin LYMPHOVASCULAR INVASION: Not identified PERINEURAL INVASION:  Present REGIONAL LYMPH NODES:     NUMBER OF LYMPH NODES INVOLVED: 2     NUMBER OF LYMPH NODES EXAMINED: 24  LATERALITY OF LYMPH NODES INVOLVED: Cannot determine     SIZE OF LARGEST METASTATIC DEPOSIT: 0.8 cm     EXTRANODAL EXTENSION: Not identified PATHOLOGIC STAGE  CLASSIFICATION (pTNM, AJCC 8TH Ed): (r)(y) pT4b pN2a DISTANT METASTASIS (pM): ADDITIONAL PATHOLOGIC FINDINGS: Previous chemoradiation   11/04/2019 Imaging   PET: 1.  Hypermetabolic bilateral neck lesions as detailed above are concerning for disease recurrence. 2.  Compared to 07/08/2019 PET scan, interval development of subcentimeter bilateral upper lobe pulmonary nodules, size below PET resolution. These are concerning for metastasis. Consider diagnostic quality CT chest exam for further evaluation as per oncology treatment planning. 3.  Focal hypermetabolic uptake in right ventricular cavity of indeterminate significance. Recommend echocardiographic examination to rule out underlying thrombus. This finding was discussed with Dr. Conley Canal by Dr. Clementeen Graham and Dr. Marcello Moores on 11/04/2019 at approximately 3:00 PM via telephone. 4.  Ancillary CT findings as above.   11/05/2019 Imaging   Echocardiogram: Summary   1. The left ventricle is normal in size with normal wall thickness.   2. The left ventricular systolic function is normal, LVEF is visually estimated at 60-65%.   3. The right ventricle is normal in size, with normal systolic function.   4. There is an extrinsic compression of the RV and RA laterally at the AV groove by a mass of 4.3 cm diameter. Unclear if the mass is invasive, although no pericardial effusion seen suggestive no invasion possibly. There is no evidence of hemodynamic compromise secondary to cardiac compression with normal IVC diameter.   12/06/2019 Pathology Results   A. LYMPH NODE, LEFT SUBMANDIBULAR, BIOPSY:  - Squamous cell carcinoma.  - No distinct nodal tissue identified.    12/06/2019 Procedure   Successful ultrasound-guided core biopsy of centrally necrotic left submandibular lymph node. Of note, due to extensive prior postsurgical and post radiation changes, patient has very limited mobility in the neck and the biopsy was extremely challenging    12/16/2019 -   Chemotherapy   The patient had pembrolizumab for chemotherapy treatment.     03/08/2020 PET scan   1. Diminished size of masses in the neck as described. Persistent increased metabolic activity particularly in the LEFT neck. Some of this diminished size on the LEFT could be due to decompression of necrotic material. Correlate with any symptoms of inflammation or infection in this area as well given the paucity of residual soft tissue associated with the above abnormality, overall findings are compatible with residual disease in this location and there is no significant surrounding stranding to indicate inflammation that would correlate with the degree of FDG uptake remaining. 2. Diminished activity in the juxta cardiac mass with peripheral activity suggesting interval necrosis, on the prior study this was uniformly hypermetabolic. There is still considerable FDG uptake. 3. No signs of disease in the abdomen or pelvis. 4. 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 Correlate with any new pain or symptoms in this area with further imaging as warranted. No discrete mass or bone destruction on today's study.   06/08/2020 Imaging   CT neck 1. Status post laryngectomy and neck dissection without lymphadenopathy or mass lesion. 2. Short segment severe stenosis of the left internal carotid artery adjacent to a surgical clip.     06/08/2020 Imaging   CT chest 1. There is a large, hypodense lesion within, or very closely abutting and likely intruding upon the right ventricle, as seen on prior examination and approximately 4.6 x 4.3 cm, poorly evaluated due to cardiac motion. Findings are consistent with metastatic  lesion and/or thrombus and poorly assessed by ungated CT. Consider echocardiography and cardiac MRI for further assessment.   2. Multiple small pulmonary nodules in the bilateral lung apices are unchanged. Attention on follow-up.   3.  No evidence of new metastatic disease in the chest.   4.   Aortic Atherosclerosis (ICD10-I70.0).     09/07/2020 Imaging   1. Mild decrease in size of juxta cardiac metastatic involving the right ventricle. 2. Small pulmonary nodules are unchanged from previous exam. 3. Aortic atherosclerosis. 4. Gallstone.     12/14/2020 Procedure   Successful exchange and up size from 20 French pull-through gastrostomy to 22 French balloon retention gastrostomy.   03/30/2021 Imaging   Ct neck 1.  In comparison with PET/CT from February 2021, marked interval decrease in size of soft tissue mass along the angle of the left mandible consistent with a positive treatment response. However, there are new destructive bony changes along the lingual surface of the mandible in this region with appearance of soft tissue infiltrating the marrow cavity. While concerning for neoplastic involvement, changes could also represent other nonspecific osteonecrosis including infection or sequela of prior radiation treatment. Recommend correlation with direct visual inspection. Further evaluation with MRI may be of value.  2.  Centrally necrotic 1.3 cm right level IIa lymph node corresponding with previously described hypermetabolic lesion, decreased in size from prior. No suspicious new or enlarged lymph nodes in the neck.  3.  Enlarged right lower paratracheal lymph node suspected in the mediastinum, incompletely imaged. Consider dedicated CT imaging of the chest.  4.  Scattered pulmonary nodules in the imaged lung apices, the largest a 9 mm lesion in the right upper lobe. Recommend comparison with outside imaging ofthe chest if available.   05/03/2021 Echocardiogram    1. Left ventricular ejection fraction, by estimation, is 55 to 60%. The left ventricle has normal function. The left ventricle has no regional wall motion abnormalities. There is mild left ventricular hypertrophy. Left ventricular diastolic parameters are consistent with Grade I diastolic dysfunction (impaired relaxation).   2. Prominent echogenic structure on the RV free wall, possibly attached to the TV - measuring about 2.0 cm in diameter and 3.0 cm in length - some mobile filamentous material is attached, suggestive of thrombus or less likely tumor.. Right ventricular systolic function is normal. The right ventricular size is normal.  3. The mitral valve is grossly normal. Trivial mitral valve regurgitation.  4. The aortic valve is tricuspid. Aortic valve regurgitation is not visualized.     05/04/2021 Imaging   RIGHT: - There is no evidence of deep vein thrombosis in the lower extremity.   - No cystic structure found in the popliteal fossa.   LEFT: - There is no evidence of deep vein thrombosis in the lower extremity.   - No cystic structure found in the popliteal fossa.     07/12/2021 Imaging   1. A large, heterogeneous mass about and within the right ventricle is difficult to clearly assess due to cardiac motion and mixing artifact on this non tailored examination, but suspect it has significantly increased in size, and now involves the right atrium in addition to the right ventricle and pulmonary outflow tract, measuring approximately 6.9 x 4.9 cm, previously 4.5 x 4.2 cm when measured similarly. This may reflect a combination of metastatic soft tissue and thrombus, and could be more clearly assessed by echocardiography and cardiac MRI. 2. No significant change of a right upper lobe pulmonary nodule, measuring 0.8 cm,  which remains modestly suspicious for a metastasis. Other small nodules are likewise stable. Attention on follow-up. 3. Status post laryngectomy and tracheostomy. 4. Coronary artery disease.   07/24/2021 Echocardiogram    1. There is an echogenic RV and RVOT mass. Attachment appears to be on the right ventricular mid wall. Mass is 32 by 24 mm, though may be underestimated. Right ventricular systolic function is normal. The right ventricular size is normal. There is normal pulmonary artery  systolic pressure.  2. Left ventricular ejection fraction, by estimation, is 60 to 65%. The left ventricle has normal function. The left ventricle has no regional wall motion abnormalities. Left ventricular diastolic parameters are consistent with Grade I diastolic dysfunction (impaired relaxation). The average left ventricular global longitudinal strain is -19.0 %. The global longitudinal strain is normal.  3. The mitral valve is normal in structure. No evidence of mitral valve regurgitation.  4. The aortic valve is tricuspid. Aortic valve regurgitation is not visualized. No aortic stenosis is present.   Comparison(s): A prior study was performed on 05/03/21. Prior images reviewed side by side. Similar size of RV mass to slight increase. Do not see right atrial involvement in this study.   Conclusion(s)/Recommendation(s): If concerned for tumor vs thombus, cardiac MRI could be considered   08/02/2021 Imaging   1. Large RV mass measuring 67mm x 3mm x 68mm. Mass is isointense to myocardium on T1 weighted imaging, does not suppress with fat saturation, isointense on T2 weighted imaging, perfuses on first pass perfusion, and heterogenous enhancement on LGE imaging. This is consistent with tumor, and in setting of known metastatic laryngeal cancer, is consistent with metastasis. There does appear to be thrombus adherent to tumor   2. Normal LV size and systolic function (EF 92%). No late gadolinium enhancement to suggest myocardial scar   3.  Normal RV size with mild systolic dysfunction (EF 42%)   08/17/2021 -  Chemotherapy   Patient is on Treatment Plan : HEAD/NECK Cisplatin q7d     10/31/2021 Imaging   1. RV mass measures 72mm x 62mm x 68mm, similar to prior cardiac MRI on 08/01/21. As described on previous MRI, mass is isointense to myocardium on T1 and T2 weighted imaging, with heterogenous enhancement on LGE imaging. This is consistent with tumor, and in setting of known metastatic laryngeal  cancer, is consistent with metastasis.   2. Normal LV size and systolic function (EF 68%). No LGE to suggest myocardial scar   3.  Normal RV size with low normal systolic function (EF 34%)     PHYSICAL EXAMINATION: ECOG PERFORMANCE STATUS: 1 - Symptomatic but completely ambulatory  Vitals:   11/02/21 0932  BP: 98/78  Pulse: 82  Resp: 18  Temp: (!) 97.4 F (36.3 C)  SpO2: 100%   Filed Weights   11/02/21 0932  Weight: 139 lb 9.6 oz (63.3 kg)    GENERAL:alert, no distress and comfortable  LABORATORY DATA:  I have reviewed the data as listed    Component Value Date/Time   NA 137 10/26/2021 0907   K 4.2 10/26/2021 0907   CL 103 10/26/2021 0907   CO2 25 10/26/2021 0907   GLUCOSE 95 10/26/2021 0907   BUN 26 (H) 10/26/2021 0907   CREATININE 0.75 10/26/2021 0907   CALCIUM 8.4 (L) 10/26/2021 0907   PROT 7.4 08/14/2021 0905   ALBUMIN 3.9 08/14/2021 0905   AST 19 08/14/2021 0905   ALT 16 08/14/2021 0905   ALKPHOS 72 08/14/2021 0905   BILITOT 0.3  08/14/2021 0905   GFRNONAA >60 10/26/2021 0907   GFRAA >60 06/08/2020 1303   GFRAA >60 02/17/2020 1128    No results found for: SPEP, UPEP  Lab Results  Component Value Date   WBC 2.9 (L) 10/26/2021   NEUTROABS 1.5 (L) 10/26/2021   HGB 10.8 (L) 10/26/2021   HCT 31.4 (L) 10/26/2021   MCV 90.0 10/26/2021   PLT 143 (L) 10/26/2021      Chemistry      Component Value Date/Time   NA 137 10/26/2021 0907   K 4.2 10/26/2021 0907   CL 103 10/26/2021 0907   CO2 25 10/26/2021 0907   BUN 26 (H) 10/26/2021 0907   CREATININE 0.75 10/26/2021 0907      Component Value Date/Time   CALCIUM 8.4 (L) 10/26/2021 0907   ALKPHOS 72 08/14/2021 0905   AST 19 08/14/2021 0905   ALT 16 08/14/2021 0905   BILITOT 0.3 08/14/2021 0905       RADIOGRAPHIC STUDIES: I have personally reviewed the radiological images as listed and agreed with the findings in the report. MR CARDIAC MORPHOLOGY W WO CONTRAST  Result Date: 10/30/2021 CLINICAL  DATA:  RV mass follow-up EXAM: CARDIAC MRI TECHNIQUE: The patient was scanned on a 1.5 Tesla Siemens magnet. A dedicated cardiac coil was used. Functional imaging was done using Fiesta sequences. 2,3, and 4 chamber views were done to assess for RWMA's. Modified Simpson's rule using a short axis stack was used to calculate an ejection fraction on a dedicated work Conservation officer, nature. The patient received 7 cc of Gadavist. After 10 minutes inversion recovery sequences were used to assess for infiltration and scar tissue. CONTRAST:  7 cc  of Gadavist FINDINGS: Left ventricle: -Normal size -Normal systolic function -Elevated ECV (33%) -No LGE LV EF:  68% (Normal 56-78%) Absolute volumes: LV EDV: 23mL (Normal 52-141 mL) LV ESV: 11mL (Normal 13-51 mL) LV SV: 80mL (Normal 33-97 mL) CO: 3.7L/min (Normal 2.7-6.0 L/min) Indexed volumes: LV EDV: 41mL/sq-m (Normal 41-81 mL/sq-m) LV ESV: 90mL/sq-m (Normal 12-21 mL/sq-m) LV SV: 45mL/sq-m (Normal 26-56 mL/sq-m) CI: 2.2L/min/sq-m (Normal 1.8-3.8 L/min/sq-m) Right ventricle: -Mass measured 42mm x 38mm x 48mm. Mass is isointense to myocardium on T1 and T2 weighted imaging, with heterogenous enhancement on LGE imaging consistent with tumor -Normal size with low normal systolic function RV EF: 07% (Normal 47-80%) Absolute volumes: RV EDV: 178mL (Normal 58-154 mL) RV ESV: 61mL (Normal 12-68 mL) RV SV: 31mL (Normal 35-98 mL) CO: 3.5L/min (Normal 2.7-6 L/min) Indexed volumes: RV EDV: 21mL/sq-m (Normal 48-87 mL/sq-m) RV ESV: 80mL/sq-m (Normal 11-28 mL/sq-m) RV SV: 5mL/sq-m (Normal 27-57 mL/sq-m) CI: 2.1L/min/sq-m (Normal 1.8-3.8 L/min/sq-m) Left atrium: Normal size Right atrium: Normal size Mitral valve: No regurgitation Aortic valve: No regurgitation Tricuspid valve: Trivial regurgitation Pulmonic valve: No regurgitation Aorta: Normal proximal ascending aorta Pericardium: Normal IMPRESSION: 1. RV mass measures 63mm x 84mm x 72mm, similar to prior cardiac MRI on 08/01/21. As  described on previous MRI, mass is isointense to myocardium on T1 and T2 weighted imaging, with heterogenous enhancement on LGE imaging. This is consistent with tumor, and in setting of known metastatic laryngeal cancer, is consistent with metastasis. 2. Normal LV size and systolic function (EF 37%). No LGE to suggest myocardial scar 3.  Normal RV size with low normal systolic function (EF 10%) Electronically Signed   By: Oswaldo Milian M.D.   On: 10/30/2021 23:20

## 2021-11-02 NOTE — Assessment & Plan Note (Signed)
She has acquired pancytopenia due to treatment She is not symptomatic We will proceed with treatment without delay, as long as her [place] is greater than 1.0

## 2021-11-02 NOTE — Assessment & Plan Note (Signed)
I plan to add CT imaging of the chest to assess and may So far, she have no clinical signs or symptoms of cough, chest pain or shortness of breath

## 2021-11-02 NOTE — Assessment & Plan Note (Signed)
She will continue anticoagulation therapy to reduce risk of embolic phenomena

## 2021-11-02 NOTE — Assessment & Plan Note (Signed)
She has stable pain control She will continue the same

## 2021-11-02 NOTE — Assessment & Plan Note (Signed)
MRI so far shows stable disease We discussed the risk and benefits of switching treatment versus continuing treatment for another few more months So far, she tolerated treatment well except for mild pancytopenia Ultimately, after long discussion, she would like to continue treatment for another few months I plan to order repeat MRI in May of this year

## 2021-11-08 MED FILL — Fosaprepitant Dimeglumine For IV Infusion 150 MG (Base Eq): INTRAVENOUS | Qty: 5 | Status: AC

## 2021-11-08 MED FILL — Dexamethasone Sodium Phosphate Inj 100 MG/10ML: INTRAMUSCULAR | Qty: 1 | Status: AC

## 2021-11-09 ENCOUNTER — Encounter: Payer: Self-pay | Admitting: Hematology and Oncology

## 2021-11-09 ENCOUNTER — Other Ambulatory Visit: Payer: Self-pay

## 2021-11-09 ENCOUNTER — Inpatient Hospital Stay: Payer: Medicare HMO

## 2021-11-09 ENCOUNTER — Inpatient Hospital Stay (HOSPITAL_BASED_OUTPATIENT_CLINIC_OR_DEPARTMENT_OTHER): Payer: Medicare HMO | Admitting: Hematology and Oncology

## 2021-11-09 DIAGNOSIS — Z5111 Encounter for antineoplastic chemotherapy: Secondary | ICD-10-CM | POA: Diagnosis not present

## 2021-11-09 DIAGNOSIS — G893 Neoplasm related pain (acute) (chronic): Secondary | ICD-10-CM | POA: Diagnosis not present

## 2021-11-09 DIAGNOSIS — E559 Vitamin D deficiency, unspecified: Secondary | ICD-10-CM

## 2021-11-09 DIAGNOSIS — C329 Malignant neoplasm of larynx, unspecified: Secondary | ICD-10-CM

## 2021-11-09 DIAGNOSIS — T451X5D Adverse effect of antineoplastic and immunosuppressive drugs, subsequent encounter: Secondary | ICD-10-CM | POA: Diagnosis not present

## 2021-11-09 DIAGNOSIS — Z95828 Presence of other vascular implants and grafts: Secondary | ICD-10-CM

## 2021-11-09 DIAGNOSIS — D61818 Other pancytopenia: Secondary | ICD-10-CM

## 2021-11-09 DIAGNOSIS — Z7901 Long term (current) use of anticoagulants: Secondary | ICD-10-CM | POA: Diagnosis not present

## 2021-11-09 DIAGNOSIS — C7989 Secondary malignant neoplasm of other specified sites: Secondary | ICD-10-CM | POA: Diagnosis not present

## 2021-11-09 DIAGNOSIS — I513 Intracardiac thrombosis, not elsewhere classified: Secondary | ICD-10-CM | POA: Diagnosis not present

## 2021-11-09 DIAGNOSIS — C321 Malignant neoplasm of supraglottis: Secondary | ICD-10-CM | POA: Diagnosis not present

## 2021-11-09 DIAGNOSIS — Z930 Tracheostomy status: Secondary | ICD-10-CM | POA: Diagnosis not present

## 2021-11-09 DIAGNOSIS — E039 Hypothyroidism, unspecified: Secondary | ICD-10-CM

## 2021-11-09 DIAGNOSIS — D61810 Antineoplastic chemotherapy induced pancytopenia: Secondary | ICD-10-CM | POA: Diagnosis not present

## 2021-11-09 LAB — CBC WITH DIFFERENTIAL (CANCER CENTER ONLY)
Abs Immature Granulocytes: 0.01 10*3/uL (ref 0.00–0.07)
Basophils Absolute: 0 10*3/uL (ref 0.0–0.1)
Basophils Relative: 0 %
Eosinophils Absolute: 0.1 10*3/uL (ref 0.0–0.5)
Eosinophils Relative: 3 %
HCT: 29.7 % — ABNORMAL LOW (ref 36.0–46.0)
Hemoglobin: 9.7 g/dL — ABNORMAL LOW (ref 12.0–15.0)
Immature Granulocytes: 0 %
Lymphocytes Relative: 28 %
Lymphs Abs: 0.7 10*3/uL (ref 0.7–4.0)
MCH: 30.8 pg (ref 26.0–34.0)
MCHC: 32.7 g/dL (ref 30.0–36.0)
MCV: 94.3 fL (ref 80.0–100.0)
Monocytes Absolute: 0.4 10*3/uL (ref 0.1–1.0)
Monocytes Relative: 14 %
Neutro Abs: 1.5 10*3/uL — ABNORMAL LOW (ref 1.7–7.7)
Neutrophils Relative %: 55 %
Platelet Count: 122 10*3/uL — ABNORMAL LOW (ref 150–400)
RBC: 3.15 MIL/uL — ABNORMAL LOW (ref 3.87–5.11)
RDW: 16.7 % — ABNORMAL HIGH (ref 11.5–15.5)
WBC Count: 2.6 10*3/uL — ABNORMAL LOW (ref 4.0–10.5)
nRBC: 0 % (ref 0.0–0.2)

## 2021-11-09 LAB — BASIC METABOLIC PANEL - CANCER CENTER ONLY
Anion gap: 8 (ref 5–15)
BUN: 31 mg/dL — ABNORMAL HIGH (ref 8–23)
CO2: 26 mmol/L (ref 22–32)
Calcium: 8.4 mg/dL — ABNORMAL LOW (ref 8.9–10.3)
Chloride: 103 mmol/L (ref 98–111)
Creatinine: 0.8 mg/dL (ref 0.44–1.00)
GFR, Estimated: >60 mL/min (ref >60)
Glucose, Bld: 94 mg/dL (ref 70–99)
Potassium: 4.2 mmol/L (ref 3.5–5.1)
Sodium: 137 mmol/L (ref 135–145)

## 2021-11-09 LAB — TSH: TSH: 0.71 u[IU]/mL (ref 0.308–3.960)

## 2021-11-09 LAB — VITAMIN D 25 HYDROXY (VIT D DEFICIENCY, FRACTURES): Vit D, 25-Hydroxy: 59.41 ng/mL (ref 30–100)

## 2021-11-09 LAB — MAGNESIUM: Magnesium: 2.3 mg/dL (ref 1.7–2.4)

## 2021-11-09 MED ORDER — POTASSIUM CHLORIDE IN NACL 20-0.9 MEQ/L-% IV SOLN
Freq: Once | INTRAVENOUS | Status: AC
  Filled 2021-11-09: qty 1000

## 2021-11-09 MED ORDER — SODIUM CHLORIDE 0.9% FLUSH
10.0000 mL | INTRAVENOUS | Status: DC | PRN
  Administered 2021-11-09: 10 mL

## 2021-11-09 MED ORDER — HEPARIN SOD (PORK) LOCK FLUSH 100 UNIT/ML IV SOLN
500.0000 [IU] | Freq: Once | INTRAVENOUS | Status: AC | PRN
  Administered 2021-11-09: 500 [IU]

## 2021-11-09 MED ORDER — MAGNESIUM SULFATE 2 GM/50ML IV SOLN
2.0000 g | Freq: Once | INTRAVENOUS | Status: AC
  Administered 2021-11-09: 2 g via INTRAVENOUS
  Filled 2021-11-09: qty 50

## 2021-11-09 MED ORDER — PALONOSETRON HCL INJECTION 0.25 MG/5ML
0.2500 mg | Freq: Once | INTRAVENOUS | Status: AC
  Administered 2021-11-09: 0.25 mg via INTRAVENOUS
  Filled 2021-11-09: qty 5

## 2021-11-09 MED ORDER — SODIUM CHLORIDE 0.9% FLUSH
10.0000 mL | Freq: Once | INTRAVENOUS | Status: AC
  Administered 2021-11-09: 10 mL

## 2021-11-09 MED ORDER — SODIUM CHLORIDE 0.9 % IV SOLN
150.0000 mg | Freq: Once | INTRAVENOUS | Status: AC
  Administered 2021-11-09: 150 mg via INTRAVENOUS
  Filled 2021-11-09: qty 150

## 2021-11-09 MED ORDER — SODIUM CHLORIDE 0.9 % IV SOLN
30.0000 mg/m2 | Freq: Once | INTRAVENOUS | Status: AC
  Administered 2021-11-09: 50 mg via INTRAVENOUS
  Filled 2021-11-09: qty 50

## 2021-11-09 MED ORDER — SODIUM CHLORIDE 0.9 % IV SOLN
10.0000 mg | Freq: Once | INTRAVENOUS | Status: AC
  Administered 2021-11-09: 10 mg via INTRAVENOUS
  Filled 2021-11-09: qty 10

## 2021-11-09 MED ORDER — SODIUM CHLORIDE 0.9 % IV SOLN: Freq: Once | INTRAVENOUS | Status: AC

## 2021-11-09 NOTE — Patient Instructions (Signed)
[facility]  Discharge Instructions: Thank you for choosing [facility] to provide your oncology and hematology care.   If you have a lab appointment with the Cancer Center, please go directly to the Cancer Center and check in at the registration area.   Wear comfortable clothing and clothing appropriate for easy access to any Portacath or PICC line.   We strive to give you quality time with your provider. You may need to reschedule your appointment if you arrive late (15 or more minutes).  Arriving late affects you and other patients whose appointments are after yours.  Also, if you miss three or more appointments without notifying the office, you may be dismissed from the clinic at the provider's discretion.      For prescription refill requests, have your pharmacy contact our office and allow 72 hours for refills to be completed.    Today you received the following chemotherapy and/or immunotherapy agents cisplatin   To help prevent nausea and vomiting after your treatment, we encourage you to take your nausea medication as directed.  BELOW ARE SYMPTOMS THAT SHOULD BE REPORTED IMMEDIATELY: *FEVER GREATER THAN 100.4 F (38 C) OR HIGHER *CHILLS OR SWEATING *NAUSEA AND VOMITING THAT IS NOT CONTROLLED WITH YOUR NAUSEA MEDICATION *UNUSUAL SHORTNESS OF BREATH *UNUSUAL BRUISING OR BLEEDING *URINARY PROBLEMS (pain or burning when urinating, or frequent urination) *BOWEL PROBLEMS (unusual diarrhea, constipation, pain near the anus) TENDERNESS IN MOUTH AND THROAT WITH OR WITHOUT PRESENCE OF ULCERS (sore throat, sores in mouth, or a toothache) UNUSUAL RASH, SWELLING OR PAIN  UNUSUAL VAGINAL DISCHARGE OR ITCHING   Items with * indicate a potential emergency and should be followed up as soon as possible or go to the Emergency Department if any problems should occur.  Please show the CHEMOTHERAPY ALERT CARD or IMMUNOTHERAPY ALERT CARD at check-in to the  Emergency Department and triage nurse.  Should you have questions after your visit or need to cancel or reschedule your appointment, please contact [facility]  Dept: [phone]  and follow the prompts.  Office hours are 8:00 a.m. to 4:30 p.m. Monday - Friday. Please note that voicemails left after 4:00 p.m. may not be returned until the following business day.  We are closed weekends and major holidays. You have access to a nurse at all times for urgent questions. Please call the main number to the clinic Dept: [phone] and follow the prompts.   For any non-urgent questions, you may also contact your provider using MyChart. We now offer e-Visits for anyone 18 and older to request care online for non-urgent symptoms. For details visit [email or web address].   Also download the MyChart app! Go to the app store, search "MyChart", open the app, select , and log in with your MyChart username and password.  Due to Covid, a mask is required upon entering the hospital/clinic. If you do not have a mask, one will be given to you upon arrival. For doctor visits, patients may have 1 support person aged 18 or older with them. For treatment visits, patients cannot have anyone with them due to current Covid guidelines and our immunocompromised population.   

## 2021-11-09 NOTE — Assessment & Plan Note (Signed)
Her last cardiac MRI showed stable size of cancer in the ventricle She is not symptomatic She is developing slight worsening pancytopenia I plan to reduce the dose of treatment a little further and change her treatment to every other week We will repeat MRI again in May for further follow-up and assessment of response to treatment

## 2021-11-09 NOTE — Assessment & Plan Note (Signed)
She is noted to be consistently hypocalcemic She had remote blood draw done for vitamin D level that came back low She has been on chronic vitamin D supplement I plan to recheck vitamin D level again It is possible she might not be absorbing it

## 2021-11-09 NOTE — Assessment & Plan Note (Signed)
This is due to her treatment I have checked serum vitamin B12 over a year ago and that was within normal range I plan to reduce the dose of treatment a little bit and change her treatment schedule to every other week She is in agreement with the plan of care

## 2021-11-09 NOTE — Progress Notes (Signed)
[place] OFFICE PROGRESS NOTE  Patient Care Team: Hamrick, Lorin Mercy, MD as PCP - General (Family Medicine) Heath Lark, MD as Consulting Physician (Hematology and Oncology)  ASSESSMENT & PLAN:  Laryngeal squamous cell carcinoma ([place]) Her last cardiac MRI showed stable size of cancer in the ventricle She is not symptomatic She is developing slight worsening pancytopenia I plan to reduce the dose of treatment a little further and change her treatment to every other week We will repeat MRI again in May for further follow-up and assessment of response to treatment  Pancytopenia, acquired [facility]) This is due to her treatment I have checked serum vitamin B12 over a year ago and that was within normal range I plan to reduce the dose of treatment a little bit and change her treatment schedule to every other week She is in agreement with the plan of care  Vitamin D deficiency She is noted to be consistently hypocalcemic She had remote blood draw done for vitamin D level that came back low She has been on chronic vitamin D supplement I plan to recheck vitamin D level again It is possible she might not be absorbing it  Orders Placed This Encounter  Procedures   VITAMIN D 25 Hydroxy (Vit-D Deficiency, Fractures)    Standing Status:   Future    Number of Occurrences:   1    Standing Expiration Date:   11/09/2022    All questions were answered. The patient knows to call the clinic with any problems, questions or concerns. The total time spent in the appointment was 30 minutes encounter with patients including review of chart and various tests results, discussions about plan of care and coordination of care plan   Heath Lark, MD 11/09/2021 9:32 AM  INTERVAL HISTORY: Please see below for problem oriented charting. she returns for treatment follow-up for single agent cisplatin for recurrent metastatic head and neck cancer Since last time I saw her, she feels fine No recent  bleeding or infection Denies peripheral neuropathy or hearing loss Her chronic pain is well controlled with current prescribed pain medicine  REVIEW OF SYSTEMS:   Constitutional: Denies fevers, chills or abnormal weight loss Eyes: Denies blurriness of vision Ears, nose, mouth, throat, and face: Denies mucositis or sore throat Respiratory: Denies cough, dyspnea or wheezes Cardiovascular: Denies palpitation, chest discomfort or lower extremity swelling Gastrointestinal:  Denies nausea, heartburn or change in bowel habits Skin: Denies abnormal skin rashes Lymphatics: Denies new lymphadenopathy or easy bruising Neurological:Denies numbness, tingling or new weaknesses Behavioral/Psych: Mood is stable, no new changes  All other systems were reviewed with the patient and are negative.  I have reviewed the past medical history, past surgical history, social history and family history with the patient and they are unchanged from previous note.  ALLERGIES:  has no active allergies.  MEDICATIONS:  Current Outpatient Medications  Medication Sig Dispense Refill   apixaban (ELIQUIS) 2.5 MG TABS tablet Take 1 tablet (2.5 mg total) by mouth 2 (two) times daily. 60 tablet 3   apixaban (ELIQUIS) 2.5 MG TABS tablet Take 1 tablet (2.5 mg total) by mouth 2 (two) times daily. 60 tablet 3   Cholecalciferol (VITAMIN D3) 125 MCG (5000 UT) CAPS Take 5,000 Units by mouth daily.     HYDROcodone-acetaminophen (NORCO/VICODIN) 5-325 MG tablet TAKE 1 TABLET BY MOUTH EVERY 6 HOURS AS NEEDED FOR MODERATE PAIN 60 tablet 0   levothyroxine (SYNTHROID) 137 MCG tablet Take 137 mcg by mouth daily before breakfast.  lidocaine-prilocaine (EMLA) cream Apply topically daily as needed. 30 g 9   Nutritional Supplements (KATE FARMS PEPTIDE 1.5) LIQD Take 4 Bottles by mouth daily. 325 mL 11   ondansetron (ZOFRAN) 8 MG tablet Take 1 tablet (8 mg total) by mouth every 8 (eight) hours as needed for nausea. 30 tablet 3    oxymetazoline (AFRIN) 0.05 % nasal spray Place 3 sprays into both nostrils 2 (two) times daily as needed (Bleeding from stoma). 30 mL 0   prochlorperazine (COMPAZINE) 10 MG tablet Take 1 tablet (10 mg total) by mouth every 6 (six) hours as needed for nausea or vomiting. 30 tablet 1   No current facility-administered medications for this visit.   Facility-Administered Medications Ordered in Other Visits  Medication Dose Route Frequency Provider Last Rate Last Admin   0.9 % NaCl with KCl 20 mEq/ L  infusion   Intravenous Once Alvy Bimler, Mya Suell, MD       CISplatin (PLATINOL) 50 mg in sodium chloride 0.9 % 250 mL chemo infusion  30 mg/m2 (Treatment Plan Recorded) Intravenous Once Alvy Bimler, Searcy Miyoshi, MD       dexamethasone (DECADRON) 10 mg in sodium chloride 0.9 % 50 mL IVPB  10 mg Intravenous Once Alvy Bimler, Anastasio Wogan, MD       fosaprepitant (EMEND) 150 mg in sodium chloride 0.9 % 145 mL IVPB  150 mg Intravenous Once Alvy Bimler, Tatelyn Vanhecke, MD       heparin lock flush 100 unit/mL  500 Units Intracatheter Once PRN Alvy Bimler, Danyle Boening, MD       magnesium sulfate IVPB 2 g 50 mL  2 g Intravenous Once Alvy Bimler, Val Schiavo, MD       palonosetron (ALOXI) injection 0.25 mg  0.25 mg Intravenous Once Evolette Pendell, MD       sodium chloride flush (NS) 0.9 % injection 10 mL  10 mL Intracatheter PRN Heath Lark, MD        SUMMARY OF ONCOLOGIC HISTORY: Oncology History  Laryngeal squamous cell carcinoma ([place])  12/02/2018 Imaging   CT neck: IMPRESSION: 1. Supraglottic laryngeal carcinoma extending across the anterior commissure and invading the prelaryngeal fat, left aryepiglottic fold and left aspect of the epiglottis. 2. Severe narrowing of the laryngeal airway. 3. Bilateral subcentimeter level 2A and 2A cervical lymph nodes.     12/02/2018 Imaging   CT  Chest:  IMPRESSION: No evidence of metastatic disease in the chest.   12/03/2018 Pathology Results   (Care Everywhere; [facility]) A: Larynx, supraglottis, biopsy - Atypical squamous proliferation  with at least low grade dysplasia and abundant keratinization - No definite high grade dysplasia or invasive carcinoma represented (see comment)   12/18/2018 Pathology Results   (Care Everywhere; [facility]) A:  Neck, left, fine needle aspiration - Rare single atypical cells, no cells diagnostic of malignancy - Lymph node material present  B:  Neck, right, fine needle aspiration - Non-diagnostic specimen - No lymph node material identified - Mixed inflammation and blood   12/31/2018 Pathology Results   (Care Everywhere; [facility])  A: Larynx, left supraglottic mass, biopsy - Atypical squamous proliferation with abundant keratinization, suspicious for well-differentiated invasive squamous cell carcinoma  B: Larynx, left supraglottic mass, biopsy - Well-differentiated invasive squamous cell carcinoma with abundant keratinization (see comment)   01/18/2019 Initial Diagnosis   Laryngeal cancer ([place])   01/26/2019 Imaging   1. Locally advanced hypermetabolic laryngeal neoplasm centered in the left supraglottic space with bilateral glottic and epiglottic involvement and subglottic extension on the left. 2. Hypermetabolic bilateral level 3  neck nodal metastases. 3. Hypermetabolic high mediastinal nodal metastases between the trachea and upper thoracic esophagus. 4. Otherwise no distant hypermetabolic metastatic disease. 5. Nodular 7 mm focus at the left lung base, below PET resolution, recommend attention on follow-up chest CT in 3 months. 6. Chronic findings include: Aortic Atherosclerosis (ICD10-I70.0). Cholelithiasis.   02/08/2019 Cancer Staging   Staging form: Larynx - Supraglottis, AJCC 8th Edition - Clinical stage from 02/08/2019: Stage IVA (cT3, cN2c, cM0) - Signed by Eppie Gibson, MD on 02/08/2019    02/11/2019 - 04/07/2019 Chemotherapy   The patient weekly cisplatin for chemotherapy treatment.     07/20/2019 Pathology Results   PROCEDURE: Total glossectomy, total pharyngectomy,  total laryngectomy, total thyroidectomy, and cervical esophagectomy TUMOR SITE: Larynx TUMOR LATERALITY: Not specified TUMOR FOCALITY: Unifocal TUMOR SIZE:    GREATEST DIMENSION: 6.9 cm    ADDITIONAL DIMENSIONS: 5.4 x 2.4 cm HISTOLOGIC TYPE: Squamous cell carcinoma, conventional (keratinizing) HISTOLOGIC GRADE:  G1-G2: Well to moderately differentiated TUMOR EXTENSION: Tumor involves the posterior tongue, pharynx, larynx, tracheal cartilage, hyoid bone, cervical esophagus, vagus nerve, carotid artery, deep cervical fascia, ans surrounding skeletal muscle and soft tissue MARGINS: Uninvolved by invasive tumor; Perineural invasion is less than 56mm from the left soft tissue margin LYMPHOVASCULAR INVASION: Not identified PERINEURAL INVASION:  Present REGIONAL LYMPH NODES:     NUMBER OF LYMPH NODES INVOLVED: 2     NUMBER OF LYMPH NODES EXAMINED: 24     LATERALITY OF LYMPH NODES INVOLVED: Cannot determine     SIZE OF LARGEST METASTATIC DEPOSIT: 0.8 cm     EXTRANODAL EXTENSION: Not identified PATHOLOGIC STAGE CLASSIFICATION (pTNM, AJCC 8TH Ed): (r)(y) pT4b pN2a DISTANT METASTASIS (pM): ADDITIONAL PATHOLOGIC FINDINGS: Previous chemoradiation   11/04/2019 Imaging   PET: 1.  Hypermetabolic bilateral neck lesions as detailed above are concerning for disease recurrence. 2.  Compared to 07/08/2019 PET scan, interval development of subcentimeter bilateral upper lobe pulmonary nodules, size below PET resolution. These are concerning for metastasis. Consider diagnostic quality CT chest exam for further evaluation as per oncology treatment planning. 3.  Focal hypermetabolic uptake in right ventricular cavity of indeterminate significance. Recommend echocardiographic examination to rule out underlying thrombus. This finding was discussed with Dr. Conley Canal by Dr. Clementeen Graham and Dr. Marcello Moores on 11/04/2019 at approximately 3:00 PM via telephone. 4.  Ancillary CT findings as above.   11/05/2019 Imaging    Echocardiogram: Summary   1. The left ventricle is normal in size with normal wall thickness.   2. The left ventricular systolic function is normal, LVEF is visually estimated at 60-65%.   3. The right ventricle is normal in size, with normal systolic function.   4. There is an extrinsic compression of the RV and RA laterally at the AV groove by a mass of 4.3 cm diameter. Unclear if the mass is invasive, although no pericardial effusion seen suggestive no invasion possibly. There is no evidence of hemodynamic compromise secondary to cardiac compression with normal IVC diameter.   12/06/2019 Pathology Results   A. LYMPH NODE, LEFT SUBMANDIBULAR, BIOPSY:  - Squamous cell carcinoma.  - No distinct nodal tissue identified.    12/06/2019 Procedure   Successful ultrasound-guided core biopsy of centrally necrotic left submandibular lymph node. Of note, due to extensive prior postsurgical and post radiation changes, patient has very limited mobility in the neck and the biopsy was extremely challenging    12/16/2019 -  Chemotherapy   The patient had pembrolizumab for chemotherapy treatment.     03/08/2020 PET scan  1. Diminished size of masses in the neck as described. Persistent increased metabolic activity particularly in the LEFT neck. Some of this diminished size on the LEFT could be due to decompression of necrotic material. Correlate with any symptoms of inflammation or infection in this area as well given the paucity of residual soft tissue associated with the above abnormality, overall findings are compatible with residual disease in this location and there is no significant surrounding stranding to indicate inflammation that would correlate with the degree of FDG uptake remaining. 2. Diminished activity in the juxta cardiac mass with peripheral activity suggesting interval necrosis, on the prior study this was uniformly hypermetabolic. There is still considerable FDG uptake. 3. No signs of  disease in the abdomen or pelvis. 4. 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 Correlate with any new pain or symptoms in this area with further imaging as warranted. No discrete mass or bone destruction on today's study.   06/08/2020 Imaging   CT neck 1. Status post laryngectomy and neck dissection without lymphadenopathy or mass lesion. 2. Short segment severe stenosis of the left internal carotid artery adjacent to a surgical clip.     06/08/2020 Imaging   CT chest 1. There is a large, hypodense lesion within, or very closely abutting and likely intruding upon the right ventricle, as seen on prior examination and approximately 4.6 x 4.3 cm, poorly evaluated due to cardiac motion. Findings are consistent with metastatic lesion and/or thrombus and poorly assessed by ungated CT. Consider echocardiography and cardiac MRI for further assessment.   2. Multiple small pulmonary nodules in the bilateral lung apices are unchanged. Attention on follow-up.   3.  No evidence of new metastatic disease in the chest.   4.  Aortic Atherosclerosis (ICD10-I70.0).     09/07/2020 Imaging   1. Mild decrease in size of juxta cardiac metastatic involving the right ventricle. 2. Small pulmonary nodules are unchanged from previous exam. 3. Aortic atherosclerosis. 4. Gallstone.     12/14/2020 Procedure   Successful exchange and up size from 20 French pull-through gastrostomy to 22 French balloon retention gastrostomy.   03/30/2021 Imaging   Ct neck 1.  In comparison with PET/CT from February 2021, marked interval decrease in size of soft tissue mass along the angle of the left mandible consistent with a positive treatment response. However, there are new destructive bony changes along the lingual surface of the mandible in this region with appearance of soft tissue infiltrating the marrow cavity. While concerning for neoplastic involvement, changes could also represent other nonspecific osteonecrosis  including infection or sequela of prior radiation treatment. Recommend correlation with direct visual inspection. Further evaluation with MRI may be of value.  2.  Centrally necrotic 1.3 cm right level IIa lymph node corresponding with previously described hypermetabolic lesion, decreased in size from prior. No suspicious new or enlarged lymph nodes in the neck.  3.  Enlarged right lower paratracheal lymph node suspected in the mediastinum, incompletely imaged. Consider dedicated CT imaging of the chest.  4.  Scattered pulmonary nodules in the imaged lung apices, the largest a 9 mm lesion in the right upper lobe. Recommend comparison with outside imaging ofthe chest if available.   05/03/2021 Echocardiogram    1. Left ventricular ejection fraction, by estimation, is 55 to 60%. The left ventricle has normal function. The left ventricle has no regional wall motion abnormalities. There is mild left ventricular hypertrophy. Left ventricular diastolic parameters are consistent with Grade I diastolic dysfunction (impaired relaxation).  2.  Prominent echogenic structure on the RV free wall, possibly attached to the TV - measuring about 2.0 cm in diameter and 3.0 cm in length - some mobile filamentous material is attached, suggestive of thrombus or less likely tumor.. Right ventricular systolic function is normal. The right ventricular size is normal.  3. The mitral valve is grossly normal. Trivial mitral valve regurgitation.  4. The aortic valve is tricuspid. Aortic valve regurgitation is not visualized.     05/04/2021 Imaging   RIGHT: - There is no evidence of deep vein thrombosis in the lower extremity.   - No cystic structure found in the popliteal fossa.   LEFT: - There is no evidence of deep vein thrombosis in the lower extremity.   - No cystic structure found in the popliteal fossa.     07/12/2021 Imaging   1. A large, heterogeneous mass about and within the right ventricle is difficult to  clearly assess due to cardiac motion and mixing artifact on this non tailored examination, but suspect it has significantly increased in size, and now involves the right atrium in addition to the right ventricle and pulmonary outflow tract, measuring approximately 6.9 x 4.9 cm, previously 4.5 x 4.2 cm when measured similarly. This may reflect a combination of metastatic soft tissue and thrombus, and could be more clearly assessed by echocardiography and cardiac MRI. 2. No significant change of a right upper lobe pulmonary nodule, measuring 0.8 cm, which remains modestly suspicious for a metastasis. Other small nodules are likewise stable. Attention on follow-up. 3. Status post laryngectomy and tracheostomy. 4. Coronary artery disease.   07/24/2021 Echocardiogram    1. There is an echogenic RV and RVOT mass. Attachment appears to be on the right ventricular mid wall. Mass is 32 by 24 mm, though may be underestimated. Right ventricular systolic function is normal. The right ventricular size is normal. There is normal pulmonary artery systolic pressure.  2. Left ventricular ejection fraction, by estimation, is 60 to 65%. The left ventricle has normal function. The left ventricle has no regional wall motion abnormalities. Left ventricular diastolic parameters are consistent with Grade I diastolic dysfunction (impaired relaxation). The average left ventricular global longitudinal strain is -19.0 %. The global longitudinal strain is normal.  3. The mitral valve is normal in structure. No evidence of mitral valve regurgitation.  4. The aortic valve is tricuspid. Aortic valve regurgitation is not visualized. No aortic stenosis is present.   Comparison(s): A prior study was performed on 05/03/21. Prior images reviewed side by side. Similar size of RV mass to slight increase. Do not see right atrial involvement in this study.   Conclusion(s)/Recommendation(s): If concerned for tumor vs thombus, cardiac MRI could be  considered   08/02/2021 Imaging   1. Large RV mass measuring 47mm x 51mm x 75mm. Mass is isointense to myocardium on T1 weighted imaging, does not suppress with fat saturation, isointense on T2 weighted imaging, perfuses on first pass perfusion, and heterogenous enhancement on LGE imaging. This is consistent with tumor, and in setting of known metastatic laryngeal cancer, is consistent with metastasis. There does appear to be thrombus adherent to tumor   2. Normal LV size and systolic function (EF 92%). No late gadolinium enhancement to suggest myocardial scar   3.  Normal RV size with mild systolic dysfunction (EF 33%)   08/17/2021 -  Chemotherapy   Patient is on Treatment Plan : HEAD/NECK Cisplatin q7d     10/31/2021 Imaging   1. RV mass measures 55mm  x 14mm x 36mm, similar to prior cardiac MRI on 08/01/21. As described on previous MRI, mass is isointense to myocardium on T1 and T2 weighted imaging, with heterogenous enhancement on LGE imaging. This is consistent with tumor, and in setting of known metastatic laryngeal cancer, is consistent with metastasis.   2. Normal LV size and systolic function (EF 01%). No LGE to suggest myocardial scar   3.  Normal RV size with low normal systolic function (EF 74%)     PHYSICAL EXAMINATION: ECOG PERFORMANCE STATUS: 1 - Symptomatic but completely ambulatory  Vitals:   11/09/21 0854  BP: 108/69  Pulse: 71  Resp: 18  Temp: 97.9 F (36.6 C)  SpO2: 96%   Filed Weights   11/09/21 0854  Weight: 141 lb 3.2 oz (64 kg)    GENERAL:alert, no distress and comfortable SKIN: skin color, texture, turgor are normal, no rashes or significant lesions EYES: normal, Conjunctiva are pink and non-injected, sclera clear OROPHARYNX:no exudate, no erythema and lips, buccal mucosa, and tongue normal without signs of thrush NECK: Tracheostomy in situ with significant radiation fibrosis LYMPH:  no palpable lymphadenopathy in the cervical, axillary or  inguinal LUNGS: clear to auscultation and percussion with normal breathing effort HEART: regular rate & rhythm and no murmurs and no lower extremity edema ABDOMEN:abdomen soft, non-tender and normal bowel sounds.  Feeding tube looks okay Musculoskeletal:no cyanosis of digits and no clubbing  NEURO: alert & oriented x 3 , no focal motor/sensory deficits  LABORATORY DATA:  I have reviewed the data as listed    Component Value Date/Time   NA 137 11/09/2021 0835   K 4.2 11/09/2021 0835   CL 103 11/09/2021 0835   CO2 26 11/09/2021 0835   GLUCOSE 94 11/09/2021 0835   BUN 31 (H) 11/09/2021 0835   CREATININE 0.80 11/09/2021 0835   CALCIUM 8.4 (L) 11/09/2021 0835   PROT 7.4 08/14/2021 0905   ALBUMIN 3.9 08/14/2021 0905   AST 19 08/14/2021 0905   ALT 16 08/14/2021 0905   ALKPHOS 72 08/14/2021 0905   BILITOT 0.3 08/14/2021 0905   GFRNONAA >60 11/09/2021 0835   GFRAA >60 06/08/2020 1303   GFRAA >60 02/17/2020 1128    No results found for: SPEP, UPEP  Lab Results  Component Value Date   WBC 2.6 (L) 11/09/2021   NEUTROABS 1.5 (L) 11/09/2021   HGB 9.7 (L) 11/09/2021   HCT 29.7 (L) 11/09/2021   MCV 94.3 11/09/2021   PLT 122 (L) 11/09/2021      Chemistry      Component Value Date/Time   NA 137 11/09/2021 0835   K 4.2 11/09/2021 0835   CL 103 11/09/2021 0835   CO2 26 11/09/2021 0835   BUN 31 (H) 11/09/2021 0835   CREATININE 0.80 11/09/2021 0835      Component Value Date/Time   CALCIUM 8.4 (L) 11/09/2021 0835   ALKPHOS 72 08/14/2021 0905   AST 19 08/14/2021 0905   ALT 16 08/14/2021 0905   BILITOT 0.3 08/14/2021 0905

## 2021-11-16 ENCOUNTER — Ambulatory Visit: Payer: Medicare HMO

## 2021-11-16 ENCOUNTER — Ambulatory Visit: Payer: Medicare HMO | Admitting: Hematology and Oncology

## 2021-11-16 ENCOUNTER — Other Ambulatory Visit: Payer: Medicare HMO

## 2021-11-20 DIAGNOSIS — R633 Feeding difficulties, unspecified: Secondary | ICD-10-CM | POA: Diagnosis not present

## 2021-11-20 DIAGNOSIS — C321 Malignant neoplasm of supraglottis: Secondary | ICD-10-CM | POA: Diagnosis not present

## 2021-11-21 DIAGNOSIS — R633 Feeding difficulties, unspecified: Secondary | ICD-10-CM | POA: Diagnosis not present

## 2021-11-21 DIAGNOSIS — C321 Malignant neoplasm of supraglottis: Secondary | ICD-10-CM | POA: Diagnosis not present

## 2021-11-22 MED FILL — Fosaprepitant Dimeglumine For IV Infusion 150 MG (Base Eq): INTRAVENOUS | Qty: 5 | Status: AC

## 2021-11-22 MED FILL — Dexamethasone Sodium Phosphate Inj 100 MG/10ML: INTRAMUSCULAR | Qty: 1 | Status: AC

## 2021-11-23 ENCOUNTER — Inpatient Hospital Stay: Payer: Medicare HMO | Attending: Hematology

## 2021-11-23 ENCOUNTER — Inpatient Hospital Stay: Payer: Medicare HMO

## 2021-11-23 ENCOUNTER — Other Ambulatory Visit: Payer: Self-pay | Admitting: Hematology and Oncology

## 2021-11-23 ENCOUNTER — Other Ambulatory Visit (HOSPITAL_COMMUNITY): Payer: Self-pay

## 2021-11-23 ENCOUNTER — Other Ambulatory Visit: Payer: Self-pay

## 2021-11-23 VITALS — BP 129/72 | HR 88 | Temp 97.9°F | Resp 18 | Wt 142.5 lb

## 2021-11-23 DIAGNOSIS — C7989 Secondary malignant neoplasm of other specified sites: Secondary | ICD-10-CM | POA: Diagnosis not present

## 2021-11-23 DIAGNOSIS — I513 Intracardiac thrombosis, not elsewhere classified: Secondary | ICD-10-CM | POA: Insufficient documentation

## 2021-11-23 DIAGNOSIS — Z5111 Encounter for antineoplastic chemotherapy: Secondary | ICD-10-CM | POA: Diagnosis not present

## 2021-11-23 DIAGNOSIS — G893 Neoplasm related pain (acute) (chronic): Secondary | ICD-10-CM | POA: Insufficient documentation

## 2021-11-23 DIAGNOSIS — D61810 Antineoplastic chemotherapy induced pancytopenia: Secondary | ICD-10-CM | POA: Insufficient documentation

## 2021-11-23 DIAGNOSIS — C329 Malignant neoplasm of larynx, unspecified: Secondary | ICD-10-CM

## 2021-11-23 DIAGNOSIS — C321 Malignant neoplasm of supraglottis: Secondary | ICD-10-CM | POA: Diagnosis not present

## 2021-11-23 DIAGNOSIS — K1231 Oral mucositis (ulcerative) due to antineoplastic therapy: Secondary | ICD-10-CM | POA: Diagnosis not present

## 2021-11-23 DIAGNOSIS — E559 Vitamin D deficiency, unspecified: Secondary | ICD-10-CM | POA: Diagnosis not present

## 2021-11-23 DIAGNOSIS — Z95828 Presence of other vascular implants and grafts: Secondary | ICD-10-CM

## 2021-11-23 DIAGNOSIS — T451X5D Adverse effect of antineoplastic and immunosuppressive drugs, subsequent encounter: Secondary | ICD-10-CM | POA: Insufficient documentation

## 2021-11-23 DIAGNOSIS — Z7901 Long term (current) use of anticoagulants: Secondary | ICD-10-CM | POA: Insufficient documentation

## 2021-11-23 DIAGNOSIS — E039 Hypothyroidism, unspecified: Secondary | ICD-10-CM

## 2021-11-23 DIAGNOSIS — Z930 Tracheostomy status: Secondary | ICD-10-CM | POA: Insufficient documentation

## 2021-11-23 LAB — CBC WITH DIFFERENTIAL (CANCER CENTER ONLY)
Abs Immature Granulocytes: 0.01 10*3/uL (ref 0.00–0.07)
Basophils Absolute: 0 10*3/uL (ref 0.0–0.1)
Basophils Relative: 0 %
Eosinophils Absolute: 0.1 10*3/uL (ref 0.0–0.5)
Eosinophils Relative: 4 %
HCT: 31 % — ABNORMAL LOW (ref 36.0–46.0)
Hemoglobin: 10.2 g/dL — ABNORMAL LOW (ref 12.0–15.0)
Immature Granulocytes: 0 %
Lymphocytes Relative: 26 %
Lymphs Abs: 0.8 10*3/uL (ref 0.7–4.0)
MCH: 31.5 pg (ref 26.0–34.0)
MCHC: 32.9 g/dL (ref 30.0–36.0)
MCV: 95.7 fL (ref 80.0–100.0)
Monocytes Absolute: 0.4 10*3/uL (ref 0.1–1.0)
Monocytes Relative: 14 %
Neutro Abs: 1.6 10*3/uL — ABNORMAL LOW (ref 1.7–7.7)
Neutrophils Relative %: 56 %
Platelet Count: 146 10*3/uL — ABNORMAL LOW (ref 150–400)
RBC: 3.24 MIL/uL — ABNORMAL LOW (ref 3.87–5.11)
RDW: 15.8 % — ABNORMAL HIGH (ref 11.5–15.5)
WBC Count: 2.9 10*3/uL — ABNORMAL LOW (ref 4.0–10.5)
nRBC: 0 % (ref 0.0–0.2)

## 2021-11-23 LAB — BASIC METABOLIC PANEL - CANCER CENTER ONLY
Anion gap: 8 (ref 5–15)
BUN: 34 mg/dL — ABNORMAL HIGH (ref 8–23)
CO2: 27 mmol/L (ref 22–32)
Calcium: 8.7 mg/dL — ABNORMAL LOW (ref 8.9–10.3)
Chloride: 103 mmol/L (ref 98–111)
Creatinine: 0.82 mg/dL (ref 0.44–1.00)
GFR, Estimated: >60 mL/min (ref >60)
Glucose, Bld: 99 mg/dL (ref 70–99)
Potassium: 4.5 mmol/L (ref 3.5–5.1)
Sodium: 138 mmol/L (ref 135–145)

## 2021-11-23 LAB — TSH: TSH: 0.945 u[IU]/mL (ref 0.308–3.960)

## 2021-11-23 LAB — MAGNESIUM: Magnesium: 2.3 mg/dL (ref 1.7–2.4)

## 2021-11-23 MED ORDER — SODIUM CHLORIDE 0.9 % IV SOLN
30.0000 mg/m2 | Freq: Once | INTRAVENOUS | Status: AC
  Administered 2021-11-23: 50 mg via INTRAVENOUS
  Filled 2021-11-23: qty 50

## 2021-11-23 MED ORDER — SODIUM CHLORIDE 0.9 % IV SOLN: Freq: Once | INTRAVENOUS | Status: AC

## 2021-11-23 MED ORDER — PALONOSETRON HCL INJECTION 0.25 MG/5ML
0.2500 mg | Freq: Once | INTRAVENOUS | Status: AC
  Administered 2021-11-23: 0.25 mg via INTRAVENOUS
  Filled 2021-11-23: qty 5

## 2021-11-23 MED ORDER — SODIUM CHLORIDE 0.9% FLUSH
10.0000 mL | Freq: Once | INTRAVENOUS | Status: AC
  Administered 2021-11-23: 10 mL

## 2021-11-23 MED ORDER — HEPARIN SOD (PORK) LOCK FLUSH 100 UNIT/ML IV SOLN
500.0000 [IU] | Freq: Once | INTRAVENOUS | Status: AC | PRN
  Administered 2021-11-23: 500 [IU]

## 2021-11-23 MED ORDER — SODIUM CHLORIDE 0.9% FLUSH
10.0000 mL | INTRAVENOUS | Status: DC | PRN
  Administered 2021-11-23: 10 mL

## 2021-11-23 MED ORDER — POTASSIUM CHLORIDE IN NACL 20-0.9 MEQ/L-% IV SOLN
Freq: Once | INTRAVENOUS | Status: AC
  Filled 2021-11-23: qty 1000

## 2021-11-23 MED ORDER — MAGNESIUM SULFATE 2 GM/50ML IV SOLN
2.0000 g | Freq: Once | INTRAVENOUS | Status: AC
  Administered 2021-11-23: 2 g via INTRAVENOUS
  Filled 2021-11-23: qty 50

## 2021-11-23 MED ORDER — SODIUM CHLORIDE 0.9 % IV SOLN
10.0000 mg | Freq: Once | INTRAVENOUS | Status: AC
  Administered 2021-11-23: 10 mg via INTRAVENOUS
  Filled 2021-11-23: qty 10

## 2021-11-23 MED ORDER — SODIUM CHLORIDE 0.9 % IV SOLN
150.0000 mg | Freq: Once | INTRAVENOUS | Status: AC
  Administered 2021-11-23: 150 mg via INTRAVENOUS
  Filled 2021-11-23: qty 150

## 2021-11-23 NOTE — Patient Instructions (Signed)
[facility]  Discharge Instructions: Thank you for choosing [facility] to provide your oncology and hematology care.   If you have a lab appointment with the Cancer Center, please go directly to the Cancer Center and check in at the registration area.   Wear comfortable clothing and clothing appropriate for easy access to any Portacath or PICC line.   We strive to give you quality time with your provider. You may need to reschedule your appointment if you arrive late (15 or more minutes).  Arriving late affects you and other patients whose appointments are after yours.  Also, if you miss three or more appointments without notifying the office, you may be dismissed from the clinic at the provider's discretion.      For prescription refill requests, have your pharmacy contact our office and allow 72 hours for refills to be completed.    Today you received the following chemotherapy and/or immunotherapy agents cisplatin   To help prevent nausea and vomiting after your treatment, we encourage you to take your nausea medication as directed.  BELOW ARE SYMPTOMS THAT SHOULD BE REPORTED IMMEDIATELY: *FEVER GREATER THAN 100.4 F (38 C) OR HIGHER *CHILLS OR SWEATING *NAUSEA AND VOMITING THAT IS NOT CONTROLLED WITH YOUR NAUSEA MEDICATION *UNUSUAL SHORTNESS OF BREATH *UNUSUAL BRUISING OR BLEEDING *URINARY PROBLEMS (pain or burning when urinating, or frequent urination) *BOWEL PROBLEMS (unusual diarrhea, constipation, pain near the anus) TENDERNESS IN MOUTH AND THROAT WITH OR WITHOUT PRESENCE OF ULCERS (sore throat, sores in mouth, or a toothache) UNUSUAL RASH, SWELLING OR PAIN  UNUSUAL VAGINAL DISCHARGE OR ITCHING   Items with * indicate a potential emergency and should be followed up as soon as possible or go to the Emergency Department if any problems should occur.  Please show the CHEMOTHERAPY ALERT CARD or IMMUNOTHERAPY ALERT CARD at check-in to the  Emergency Department and triage nurse.  Should you have questions after your visit or need to cancel or reschedule your appointment, please contact [facility]  Dept: [phone]  and follow the prompts.  Office hours are 8:00 a.m. to 4:30 p.m. Monday - Friday. Please note that voicemails left after 4:00 p.m. may not be returned until the following business day.  We are closed weekends and major holidays. You have access to a nurse at all times for urgent questions. Please call the main number to the clinic Dept: [phone] and follow the prompts.   For any non-urgent questions, you may also contact your provider using MyChart. We now offer e-Visits for anyone 18 and older to request care online for non-urgent symptoms. For details visit [email or web address].   Also download the MyChart app! Go to the app store, search "MyChart", open the app, select [facility], and log in with your MyChart username and password.  Due to Covid, a mask is required upon entering the hospital/clinic. If you do not have a mask, one will be given to you upon arrival. For doctor visits, patients may have 1 support person aged 18 or older with them. For treatment visits, patients cannot have anyone with them due to current Covid guidelines and our immunocompromised population.   

## 2021-11-27 ENCOUNTER — Telehealth: Payer: Self-pay

## 2021-11-27 NOTE — Telephone Encounter (Signed)
Called [facility] regarding message left on voicemail. ?[name] [name] has a mouth infection and needs antibiotic. [name] said that they normally get amoxicillin.  ?Scheduled appt at 0900 tomorrow. They are aware of appts. ?

## 2021-11-28 ENCOUNTER — Other Ambulatory Visit (HOSPITAL_COMMUNITY): Payer: Self-pay

## 2021-11-28 ENCOUNTER — Other Ambulatory Visit: Payer: Self-pay

## 2021-11-28 ENCOUNTER — Other Ambulatory Visit: Payer: Self-pay | Admitting: *Deleted

## 2021-11-28 ENCOUNTER — Inpatient Hospital Stay: Payer: Medicare HMO | Admitting: Hematology and Oncology

## 2021-11-28 ENCOUNTER — Encounter: Payer: Self-pay | Admitting: Hematology and Oncology

## 2021-11-28 DIAGNOSIS — I513 Intracardiac thrombosis, not elsewhere classified: Secondary | ICD-10-CM | POA: Diagnosis not present

## 2021-11-28 DIAGNOSIS — E559 Vitamin D deficiency, unspecified: Secondary | ICD-10-CM | POA: Diagnosis not present

## 2021-11-28 DIAGNOSIS — T451X5D Adverse effect of antineoplastic and immunosuppressive drugs, subsequent encounter: Secondary | ICD-10-CM | POA: Diagnosis not present

## 2021-11-28 DIAGNOSIS — C7989 Secondary malignant neoplasm of other specified sites: Secondary | ICD-10-CM | POA: Diagnosis not present

## 2021-11-28 DIAGNOSIS — G893 Neoplasm related pain (acute) (chronic): Secondary | ICD-10-CM | POA: Diagnosis not present

## 2021-11-28 DIAGNOSIS — C321 Malignant neoplasm of supraglottis: Secondary | ICD-10-CM | POA: Diagnosis not present

## 2021-11-28 DIAGNOSIS — Z7901 Long term (current) use of anticoagulants: Secondary | ICD-10-CM | POA: Diagnosis not present

## 2021-11-28 DIAGNOSIS — Z5111 Encounter for antineoplastic chemotherapy: Secondary | ICD-10-CM | POA: Diagnosis not present

## 2021-11-28 DIAGNOSIS — D61810 Antineoplastic chemotherapy induced pancytopenia: Secondary | ICD-10-CM | POA: Diagnosis not present

## 2021-11-28 DIAGNOSIS — K1231 Oral mucositis (ulcerative) due to antineoplastic therapy: Secondary | ICD-10-CM | POA: Diagnosis not present

## 2021-11-28 MED ORDER — NYSTATIN 100000 UNIT/ML MT SUSP
OROMUCOSAL | 0 refills | Status: DC
  Filled 2021-11-28: qty 240, 12d supply, fill #0

## 2021-11-28 MED ORDER — NYSTATIN 100000 UNIT/ML MT SUSP
OROMUCOSAL | 0 refills | Status: DC
Start: 2021-11-28 — End: 2021-12-07

## 2021-11-28 NOTE — Assessment & Plan Note (Signed)
Her oral exam is unremarkable except for chronic changes related to prior radiation therapy ?However, the patient felt sensation of discomfort and burning ?Overall, I think this is most consistent with mucositis due to treatment ?Previously, she has responded well to Magic mouthwash with lidocaine ?I recommend Magic mouthwash with lidocaine swish and swallow 4 times a day ?I will reassess next week ?We discussed the cause of her mucositis, likely related to her chemotherapy ?If her blood count remained pancytopenic next week, I would consider dose adjustment for future treatment ?She is in agreement with the plan of care ?

## 2021-11-28 NOTE — Progress Notes (Signed)
[facility] OFFICE PROGRESS NOTE  Patient Care Team: Hamrick, Lorin Mercy, MD as PCP - General (Family Medicine) Heath Lark, MD as Consulting Physician (Hematology and Oncology)  ASSESSMENT & PLAN:  Mucositis due to antineoplastic therapy Her oral exam is unremarkable except for chronic changes related to prior radiation therapy However, the patient felt sensation of discomfort and burning Overall, I think this is most consistent with mucositis due to treatment Previously, she has responded well to Magic mouthwash with lidocaine I recommend Magic mouthwash with lidocaine swish and swallow 4 times a day I will reassess next week We discussed the cause of her mucositis, likely related to her chemotherapy If her blood count remained pancytopenic next week, I would consider dose adjustment for future treatment She is in agreement with the plan of care  No orders of the defined types were placed in this encounter.   All questions were answered. The patient knows to call the clinic with any problems, questions or concerns. The total time spent in the appointment was 20 minutes encounter with patients including review of chart and various tests results, discussions about plan of care and coordination of care plan   Heath Lark, MD 11/28/2021 10:40 AM  INTERVAL HISTORY: Please see below for problem oriented charting. she returns for evaluation with her partner She has been complaining of discomfort and burning sensation in the mouth No recent bleeding.  Denies fever or chills.  REVIEW OF SYSTEMS:   Constitutional: Denies fevers, chills or abnormal weight loss Eyes: Denies blurriness of vision Respiratory: Denies cough, dyspnea or wheezes Cardiovascular: Denies palpitation, chest discomfort or lower extremity swelling Gastrointestinal:  Denies nausea, heartburn or change in bowel habits Skin: Denies abnormal skin rashes Lymphatics: Denies new lymphadenopathy or easy  bruising Neurological:Denies numbness, tingling or new weaknesses Behavioral/Psych: Mood is stable, no new changes  All other systems were reviewed with the patient and are negative.  I have reviewed the past medical history, past surgical history, social history and family history with the patient and they are unchanged from previous note.  ALLERGIES:  has no active allergies.  MEDICATIONS:  Current Outpatient Medications  Medication Sig Dispense Refill   apixaban (ELIQUIS) 2.5 MG TABS tablet Take 1 tablet (2.5 mg total) by mouth 2 (two) times daily. 60 tablet 3   apixaban (ELIQUIS) 2.5 MG TABS tablet Take 1 tablet (2.5 mg total) by mouth 2 (two) times daily. 60 tablet 3   Cholecalciferol (VITAMIN D3) 125 MCG (5000 UT) CAPS Take 5,000 Units by mouth daily.     HYDROcodone-acetaminophen (NORCO/VICODIN) 5-325 MG tablet TAKE 1 TABLET BY MOUTH EVERY 6 HOURS AS NEEDED FOR MODERATE PAIN 60 tablet 0   levothyroxine (SYNTHROID) 137 MCG tablet Take 137 mcg by mouth daily before breakfast.     lidocaine-prilocaine (EMLA) cream Apply topically daily as needed. 30 g 9   magic mouthwash (nystatin, lidocaine, diphenhydrAMINE, alum & mag hydroxide) suspension Swish and spit with 5 MLs by mouth 4 times a day 240 mL 0   magic mouthwash (nystatin, lidocaine, diphenhydrAMINE, alum & mag hydroxide) suspension Swish and spit with 5 MLs by mouth 4 times a day 240 mL 0   Nutritional Supplements (KATE FARMS PEPTIDE 1.5) LIQD Take 4 Bottles by mouth daily. 325 mL 11   ondansetron (ZOFRAN) 8 MG tablet Take 1 tablet (8 mg total) by mouth every 8 (eight) hours as needed for nausea. 30 tablet 3   oxymetazoline (AFRIN) 0.05 % nasal spray Place 3 sprays into  both nostrils 2 (two) times daily as needed (Bleeding from stoma). 30 mL 0   prochlorperazine (COMPAZINE) 10 MG tablet Take 1 tablet (10 mg total) by mouth every 6 (six) hours as needed for nausea or vomiting. 30 tablet 1   No current facility-administered  medications for this visit.    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 Supraglottic laryngeal carcinoma extending across the anterior commissure and invading the prelaryngeal fat, left aryepiglottic fold and left aspect of the epiglottis. 2. Severe narrowing of the laryngeal airway. 3. Bilateral subcentimeter level 2A and 2A cervical lymph nodes.     12/02/2018 Imaging   CT  Chest:  IMPRESSION: No evidence of metastatic disease in the chest.   12/03/2018 Pathology Results   (Care Everywhere; [facility]) A: Larynx, supraglottis, biopsy - Atypical squamous proliferation with at least low grade dysplasia and abundant keratinization - No definite high grade dysplasia or invasive carcinoma represented (see comment)   12/18/2018 Pathology Results   (Care Everywhere; [facility]) A:  Neck, left, fine needle aspiration - Rare single atypical cells, no cells diagnostic of malignancy - Lymph node material present  B:  Neck, right, fine needle aspiration - Non-diagnostic specimen - No lymph node material identified - Mixed inflammation and blood   12/31/2018 Pathology Results   (Care Everywhere; [facility])  A: Larynx, left supraglottic mass, biopsy - Atypical squamous proliferation with abundant keratinization, suspicious for well-differentiated invasive squamous cell carcinoma  B: Larynx, left supraglottic mass, biopsy - Well-differentiated invasive squamous cell carcinoma with abundant keratinization (see comment)   01/18/2019 Initial Diagnosis   Laryngeal cancer ([place])   01/26/2019 Imaging   1. Locally advanced hypermetabolic laryngeal neoplasm centered in the left supraglottic space with bilateral glottic and epiglottic involvement and subglottic extension on the left. 2. Hypermetabolic bilateral level 3 neck nodal metastases. 3. Hypermetabolic high mediastinal nodal metastases between  the trachea and upper thoracic esophagus. 4. Otherwise no distant hypermetabolic metastatic disease. 5. Nodular 7 mm focus at the left lung base, below PET resolution, recommend attention on follow-up chest CT in 3 months. 6. Chronic findings include: Aortic Atherosclerosis (ICD10-I70.0). Cholelithiasis.   02/08/2019 Cancer Staging   Staging form: Larynx - Supraglottis, AJCC 8th Edition - Clinical stage from 02/08/2019: Stage IVA (cT3, cN2c, cM0) - Signed by Eppie Gibson, MD on 02/08/2019    02/11/2019 - 04/07/2019 Chemotherapy   The patient weekly cisplatin for chemotherapy treatment.     07/20/2019 Pathology Results   PROCEDURE: Total glossectomy, total pharyngectomy, total laryngectomy, total thyroidectomy, and cervical esophagectomy TUMOR SITE: Larynx TUMOR LATERALITY: Not specified TUMOR FOCALITY: Unifocal TUMOR SIZE:    GREATEST DIMENSION: 6.9 cm    ADDITIONAL DIMENSIONS: 5.4 x 2.4 cm HISTOLOGIC TYPE: Squamous cell carcinoma, conventional (keratinizing) HISTOLOGIC GRADE:  G1-G2: Well to moderately differentiated TUMOR EXTENSION: Tumor involves the posterior tongue, pharynx, larynx, tracheal cartilage, hyoid bone, cervical esophagus, vagus nerve, carotid artery, deep cervical fascia, ans surrounding skeletal muscle and soft tissue MARGINS: Uninvolved by invasive tumor; Perineural invasion is less than 60mm from the left soft tissue margin LYMPHOVASCULAR INVASION: Not identified PERINEURAL INVASION:  Present REGIONAL LYMPH NODES:     NUMBER OF LYMPH NODES INVOLVED: 2     NUMBER OF LYMPH NODES EXAMINED: 24     LATERALITY OF LYMPH NODES INVOLVED: Cannot determine     SIZE OF LARGEST METASTATIC DEPOSIT: 0.8 cm     EXTRANODAL EXTENSION: Not identified  PATHOLOGIC STAGE CLASSIFICATION (pTNM, AJCC 8TH Ed): (r)(y) pT4b pN2a DISTANT METASTASIS (pM): ADDITIONAL PATHOLOGIC FINDINGS: Previous chemoradiation   11/04/2019 Imaging   PET: 1.  Hypermetabolic bilateral neck lesions as  detailed above are concerning for disease recurrence. 2.  Compared to 07/08/2019 PET scan, interval development of subcentimeter bilateral upper lobe pulmonary nodules, size below PET resolution. These are concerning for metastasis. Consider diagnostic quality CT chest exam for further evaluation as per oncology treatment planning. 3.  Focal hypermetabolic uptake in right ventricular cavity of indeterminate significance. Recommend echocardiographic examination to rule out underlying thrombus. This finding was discussed with Dr. Conley Canal by Dr. Clementeen Graham and Dr. Marcello Moores on 11/04/2019 at approximately 3:00 PM via telephone. 4.  Ancillary CT findings as above.   11/05/2019 Imaging   Echocardiogram: Summary   1. The left ventricle is normal in size with normal wall thickness.   2. The left ventricular systolic function is normal, LVEF is visually estimated at 60-65%.   3. The right ventricle is normal in size, with normal systolic function.   4. There is an extrinsic compression of the RV and RA laterally at the AV groove by a mass of 4.3 cm diameter. Unclear if the mass is invasive, although no pericardial effusion seen suggestive no invasion possibly. There is no evidence of hemodynamic compromise secondary to cardiac compression with normal IVC diameter.   12/06/2019 Pathology Results   A. LYMPH NODE, LEFT SUBMANDIBULAR, BIOPSY:  - Squamous cell carcinoma.  - No distinct nodal tissue identified.    12/06/2019 Procedure   Successful ultrasound-guided core biopsy of centrally necrotic left submandibular lymph node. Of note, due to extensive prior postsurgical and post radiation changes, patient has very limited mobility in the neck and the biopsy was extremely challenging    12/16/2019 -  Chemotherapy   The patient had pembrolizumab for chemotherapy treatment.     03/08/2020 PET scan   1. Diminished size of masses in the neck as described. Persistent increased metabolic activity particularly in the  LEFT neck. Some of this diminished size on the LEFT could be due to decompression of necrotic material. Correlate with any symptoms of inflammation or infection in this area as well given the paucity of residual soft tissue associated with the above abnormality, overall findings are compatible with residual disease in this location and there is no significant surrounding stranding to indicate inflammation that would correlate with the degree of FDG uptake remaining. 2. Diminished activity in the juxta cardiac mass with peripheral activity suggesting interval necrosis, on the prior study this was uniformly hypermetabolic. There is still considerable FDG uptake. 3. No signs of disease in the abdomen or pelvis. 4. 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 Correlate with any new pain or symptoms in this area with further imaging as warranted. No discrete mass or bone destruction on today's study.   06/08/2020 Imaging   CT neck 1. Status post laryngectomy and neck dissection without lymphadenopathy or mass lesion. 2. Short segment severe stenosis of the left internal carotid artery adjacent to a surgical clip.     06/08/2020 Imaging   CT chest 1. There is a large, hypodense lesion within, or very closely abutting and likely intruding upon the right ventricle, as seen on prior examination and approximately 4.6 x 4.3 cm, poorly evaluated due to cardiac motion. Findings are consistent with metastatic lesion and/or thrombus and poorly assessed by ungated CT. Consider echocardiography and cardiac MRI for further assessment.   2. Multiple small pulmonary nodules in the  bilateral lung apices are unchanged. Attention on follow-up.   3.  No evidence of new metastatic disease in the chest.   4.  Aortic Atherosclerosis (ICD10-I70.0).     09/07/2020 Imaging   1. Mild decrease in size of juxta cardiac metastatic involving the right ventricle. 2. Small pulmonary nodules are unchanged from previous exam. 3.  Aortic atherosclerosis. 4. Gallstone.     12/14/2020 Procedure   Successful exchange and up size from 20 French pull-through gastrostomy to 22 French balloon retention gastrostomy.   03/30/2021 Imaging   Ct neck 1.  In comparison with PET/CT from February 2021, marked interval decrease in size of soft tissue mass along the angle of the left mandible consistent with a positive treatment response. However, there are new destructive bony changes along the lingual surface of the mandible in this region with appearance of soft tissue infiltrating the marrow cavity. While concerning for neoplastic involvement, changes could also represent other nonspecific osteonecrosis including infection or sequela of prior radiation treatment. Recommend correlation with direct visual inspection. Further evaluation with MRI may be of value.  2.  Centrally necrotic 1.3 cm right level IIa lymph node corresponding with previously described hypermetabolic lesion, decreased in size from prior. No suspicious new or enlarged lymph nodes in the neck.  3.  Enlarged right lower paratracheal lymph node suspected in the mediastinum, incompletely imaged. Consider dedicated CT imaging of the chest.  4.  Scattered pulmonary nodules in the imaged lung apices, the largest a 9 mm lesion in the right upper lobe. Recommend comparison with outside imaging ofthe chest if available.   05/03/2021 Echocardiogram    1. Left ventricular ejection fraction, by estimation, is 55 to 60%. The left ventricle has normal function. The left ventricle has no regional wall motion abnormalities. There is mild left ventricular hypertrophy. Left ventricular diastolic parameters are consistent with Grade I diastolic dysfunction (impaired relaxation).  2. Prominent echogenic structure on the RV free wall, possibly attached to the TV - measuring about 2.0 cm in diameter and 3.0 cm in length - some mobile filamentous material is attached, suggestive of thrombus or less  likely tumor.. Right ventricular systolic function is normal. The right ventricular size is normal.  3. The mitral valve is grossly normal. Trivial mitral valve regurgitation.  4. The aortic valve is tricuspid. Aortic valve regurgitation is not visualized.     05/04/2021 Imaging   RIGHT: - There is no evidence of deep vein thrombosis in the lower extremity.   - No cystic structure found in the popliteal fossa.   LEFT: - There is no evidence of deep vein thrombosis in the lower extremity.   - No cystic structure found in the popliteal fossa.     07/12/2021 Imaging   1. A large, heterogeneous mass about and within the right ventricle is difficult to clearly assess due to cardiac motion and mixing artifact on this non tailored examination, but suspect it has significantly increased in size, and now involves the right atrium in addition to the right ventricle and pulmonary outflow tract, measuring approximately 6.9 x 4.9 cm, previously 4.5 x 4.2 cm when measured similarly. This may reflect a combination of metastatic soft tissue and thrombus, and could be more clearly assessed by echocardiography and cardiac MRI. 2. No significant change of a right upper lobe pulmonary nodule, measuring 0.8 cm, which remains modestly suspicious for a metastasis. Other small nodules are likewise stable. Attention on follow-up. 3. Status post laryngectomy and tracheostomy. 4. Coronary artery disease.  07/24/2021 Echocardiogram    1. There is an echogenic RV and RVOT mass. Attachment appears to be on the right ventricular mid wall. Mass is 32 by 24 mm, though may be underestimated. Right ventricular systolic function is normal. The right ventricular size is normal. There is normal pulmonary artery systolic pressure.  2. Left ventricular ejection fraction, by estimation, is 60 to 65%. The left ventricle has normal function. The left ventricle has no regional wall motion abnormalities. Left ventricular diastolic  parameters are consistent with Grade I diastolic dysfunction (impaired relaxation). The average left ventricular global longitudinal strain is -19.0 %. The global longitudinal strain is normal.  3. The mitral valve is normal in structure. No evidence of mitral valve regurgitation.  4. The aortic valve is tricuspid. Aortic valve regurgitation is not visualized. No aortic stenosis is present.   Comparison(s): A prior study was performed on 05/03/21. Prior images reviewed side by side. Similar size of RV mass to slight increase. Do not see right atrial involvement in this study.   Conclusion(s)/Recommendation(s): If concerned for tumor vs thombus, cardiac MRI could be considered   08/02/2021 Imaging   1. Large RV mass measuring 78mm x 76mm x 4mm. Mass is isointense to myocardium on T1 weighted imaging, does not suppress with fat saturation, isointense on T2 weighted imaging, perfuses on first pass perfusion, and heterogenous enhancement on LGE imaging. This is consistent with tumor, and in setting of known metastatic laryngeal cancer, is consistent with metastasis. There does appear to be thrombus adherent to tumor   2. Normal LV size and systolic function (EF 89%). No late gadolinium enhancement to suggest myocardial scar   3.  Normal RV size with mild systolic dysfunction (EF 21%)   08/17/2021 -  Chemotherapy   Patient is on Treatment Plan : HEAD/NECK Cisplatin q7d     10/31/2021 Imaging   1. RV mass measures 63mm x 6mm x 41mm, similar to prior cardiac MRI on 08/01/21. As described on previous MRI, mass is isointense to myocardium on T1 and T2 weighted imaging, with heterogenous enhancement on LGE imaging. This is consistent with tumor, and in setting of known metastatic laryngeal cancer, is consistent with metastasis.   2. Normal LV size and systolic function (EF 19%). No LGE to suggest myocardial scar   3.  Normal RV size with low normal systolic function (EF 41%)     PHYSICAL  EXAMINATION: ECOG PERFORMANCE STATUS: 1 - Symptomatic but completely ambulatory  Vitals:   11/28/21 0854  BP: 120/70  Pulse: 84  Resp: 16  Temp: 97.7 F (36.5 C)  SpO2: 90%   Filed Weights   11/28/21 0854  Weight: 140 lb 6.4 oz (63.7 kg)    GENERAL:alert, no distress and comfortable OROPHARYNX she is edentulous.  Noted chronic radiation changes to her oropharynx.  No specific thrush or blisters in the mouth NEURO: alert & oriented x 3 with tracheostomy in situ, no focal motor/sensory deficits  LABORATORY DATA:  I have reviewed the data as listed    Component Value Date/Time   NA 138 11/23/2021 0852   K 4.5 11/23/2021 0852   CL 103 11/23/2021 0852   CO2 27 11/23/2021 0852   GLUCOSE 99 11/23/2021 0852   BUN 34 (H) 11/23/2021 0852   CREATININE 0.82 11/23/2021 0852   CALCIUM 8.7 (L) 11/23/2021 0852   PROT 7.4 08/14/2021 0905   ALBUMIN 3.9 08/14/2021 0905   AST 19 08/14/2021 0905   ALT 16 08/14/2021 0905   ALKPHOS 72  08/14/2021 0905   BILITOT 0.3 08/14/2021 0905   GFRNONAA >60 11/23/2021 0852   GFRAA >60 06/08/2020 1303   GFRAA >60 02/17/2020 1128    No results found for: SPEP, UPEP  Lab Results  Component Value Date   WBC 2.9 (L) 11/23/2021   NEUTROABS 1.6 (L) 11/23/2021   HGB 10.2 (L) 11/23/2021   HCT 31.0 (L) 11/23/2021   MCV 95.7 11/23/2021   PLT 146 (L) 11/23/2021      Chemistry      Component Value Date/Time   NA 138 11/23/2021 0852   K 4.5 11/23/2021 0852   CL 103 11/23/2021 0852   CO2 27 11/23/2021 0852   BUN 34 (H) 11/23/2021 0852   CREATININE 0.82 11/23/2021 0852      Component Value Date/Time   CALCIUM 8.7 (L) 11/23/2021 0852   ALKPHOS 72 08/14/2021 0905   AST 19 08/14/2021 0905   ALT 16 08/14/2021 0905   BILITOT 0.3 08/14/2021 0905

## 2021-11-30 ENCOUNTER — Ambulatory Visit: Payer: Medicare HMO

## 2021-11-30 ENCOUNTER — Encounter: Payer: Medicare HMO | Admitting: Dietician

## 2021-11-30 ENCOUNTER — Other Ambulatory Visit: Payer: Medicare HMO

## 2021-11-30 ENCOUNTER — Ambulatory Visit: Payer: Medicare HMO | Admitting: Hematology and Oncology

## 2021-12-05 DIAGNOSIS — C321 Malignant neoplasm of supraglottis: Secondary | ICD-10-CM | POA: Diagnosis not present

## 2021-12-05 DIAGNOSIS — R633 Feeding difficulties, unspecified: Secondary | ICD-10-CM | POA: Diagnosis not present

## 2021-12-07 ENCOUNTER — Inpatient Hospital Stay: Payer: Medicare HMO

## 2021-12-07 ENCOUNTER — Inpatient Hospital Stay: Payer: Medicare HMO | Admitting: Hematology and Oncology

## 2021-12-07 ENCOUNTER — Other Ambulatory Visit: Payer: Self-pay

## 2021-12-07 ENCOUNTER — Other Ambulatory Visit (HOSPITAL_COMMUNITY): Payer: Self-pay

## 2021-12-07 ENCOUNTER — Inpatient Hospital Stay: Payer: Medicare HMO | Admitting: Dietician

## 2021-12-07 ENCOUNTER — Encounter: Payer: Self-pay | Admitting: Hematology and Oncology

## 2021-12-07 DIAGNOSIS — D61818 Other pancytopenia: Secondary | ICD-10-CM

## 2021-12-07 DIAGNOSIS — C321 Malignant neoplasm of supraglottis: Secondary | ICD-10-CM

## 2021-12-07 DIAGNOSIS — C329 Malignant neoplasm of larynx, unspecified: Secondary | ICD-10-CM

## 2021-12-07 DIAGNOSIS — Z5111 Encounter for antineoplastic chemotherapy: Secondary | ICD-10-CM | POA: Diagnosis not present

## 2021-12-07 DIAGNOSIS — I513 Intracardiac thrombosis, not elsewhere classified: Secondary | ICD-10-CM | POA: Diagnosis not present

## 2021-12-07 DIAGNOSIS — E559 Vitamin D deficiency, unspecified: Secondary | ICD-10-CM | POA: Diagnosis not present

## 2021-12-07 DIAGNOSIS — K1231 Oral mucositis (ulcerative) due to antineoplastic therapy: Secondary | ICD-10-CM | POA: Diagnosis not present

## 2021-12-07 DIAGNOSIS — T451X5D Adverse effect of antineoplastic and immunosuppressive drugs, subsequent encounter: Secondary | ICD-10-CM | POA: Diagnosis not present

## 2021-12-07 DIAGNOSIS — G893 Neoplasm related pain (acute) (chronic): Secondary | ICD-10-CM | POA: Diagnosis not present

## 2021-12-07 DIAGNOSIS — D61810 Antineoplastic chemotherapy induced pancytopenia: Secondary | ICD-10-CM | POA: Diagnosis not present

## 2021-12-07 DIAGNOSIS — C7989 Secondary malignant neoplasm of other specified sites: Secondary | ICD-10-CM | POA: Diagnosis not present

## 2021-12-07 DIAGNOSIS — Z7901 Long term (current) use of anticoagulants: Secondary | ICD-10-CM | POA: Diagnosis not present

## 2021-12-07 DIAGNOSIS — Z95828 Presence of other vascular implants and grafts: Secondary | ICD-10-CM

## 2021-12-07 LAB — CBC WITH DIFFERENTIAL (CANCER CENTER ONLY)
Abs Immature Granulocytes: 0.01 10*3/uL (ref 0.00–0.07)
Basophils Absolute: 0 10*3/uL (ref 0.0–0.1)
Basophils Relative: 0 %
Eosinophils Absolute: 0.2 10*3/uL (ref 0.0–0.5)
Eosinophils Relative: 5 %
HCT: 31.3 % — ABNORMAL LOW (ref 36.0–46.0)
Hemoglobin: 10.5 g/dL — ABNORMAL LOW (ref 12.0–15.0)
Immature Granulocytes: 0 %
Lymphocytes Relative: 30 %
Lymphs Abs: 1 10*3/uL (ref 0.7–4.0)
MCH: 32.1 pg (ref 26.0–34.0)
MCHC: 33.5 g/dL (ref 30.0–36.0)
MCV: 95.7 fL (ref 80.0–100.0)
Monocytes Absolute: 0.4 10*3/uL (ref 0.1–1.0)
Monocytes Relative: 13 %
Neutro Abs: 1.6 10*3/uL — ABNORMAL LOW (ref 1.7–7.7)
Neutrophils Relative %: 52 %
Platelet Count: 148 10*3/uL — ABNORMAL LOW (ref 150–400)
RBC: 3.27 MIL/uL — ABNORMAL LOW (ref 3.87–5.11)
RDW: 14.2 % (ref 11.5–15.5)
WBC Count: 3.2 10*3/uL — ABNORMAL LOW (ref 4.0–10.5)
nRBC: 0 % (ref 0.0–0.2)

## 2021-12-07 LAB — MAGNESIUM: Magnesium: 2.3 mg/dL (ref 1.7–2.4)

## 2021-12-07 LAB — BASIC METABOLIC PANEL - CANCER CENTER ONLY
Anion gap: 7 (ref 5–15)
BUN: 33 mg/dL — ABNORMAL HIGH (ref 8–23)
CO2: 27 mmol/L (ref 22–32)
Calcium: 8.9 mg/dL (ref 8.9–10.3)
Chloride: 104 mmol/L (ref 98–111)
Creatinine: 0.83 mg/dL (ref 0.44–1.00)
GFR, Estimated: >60 mL/min (ref >60)
Glucose, Bld: 94 mg/dL (ref 70–99)
Potassium: 4.3 mmol/L (ref 3.5–5.1)
Sodium: 138 mmol/L (ref 135–145)

## 2021-12-07 MED ORDER — SODIUM CHLORIDE 0.9% FLUSH
10.0000 mL | INTRAVENOUS | Status: DC | PRN
  Administered 2021-12-07: 10 mL

## 2021-12-07 MED ORDER — HEPARIN SOD (PORK) LOCK FLUSH 100 UNIT/ML IV SOLN
500.0000 [IU] | Freq: Once | INTRAVENOUS | Status: AC | PRN
  Administered 2021-12-07: 500 [IU]

## 2021-12-07 MED ORDER — SODIUM CHLORIDE 0.9 % IV SOLN
150.0000 mg | Freq: Once | INTRAVENOUS | Status: AC
  Administered 2021-12-07: 150 mg via INTRAVENOUS
  Filled 2021-12-07: qty 150

## 2021-12-07 MED ORDER — SODIUM CHLORIDE 0.9% FLUSH
10.0000 mL | Freq: Once | INTRAVENOUS | Status: AC
  Administered 2021-12-07: 10 mL

## 2021-12-07 MED ORDER — SODIUM CHLORIDE 0.9 % IV SOLN
30.0000 mg/m2 | Freq: Once | INTRAVENOUS | Status: AC
  Administered 2021-12-07: 50 mg via INTRAVENOUS
  Filled 2021-12-07: qty 50

## 2021-12-07 MED ORDER — SODIUM CHLORIDE 0.9 % IV SOLN
10.0000 mg | Freq: Once | INTRAVENOUS | Status: AC
  Administered 2021-12-07: 10 mg via INTRAVENOUS
  Filled 2021-12-07: qty 10

## 2021-12-07 MED ORDER — HYDROCODONE-ACETAMINOPHEN 5-325 MG PO TABS
1.0000 | ORAL_TABLET | Freq: Four times a day (QID) | ORAL | 0 refills | Status: DC | PRN
  Filled 2021-12-07: qty 60, 15d supply, fill #0

## 2021-12-07 MED ORDER — PALONOSETRON HCL INJECTION 0.25 MG/5ML
0.2500 mg | Freq: Once | INTRAVENOUS | Status: AC
  Administered 2021-12-07: 0.25 mg via INTRAVENOUS
  Filled 2021-12-07: qty 5

## 2021-12-07 MED ORDER — POTASSIUM CHLORIDE IN NACL 20-0.9 MEQ/L-% IV SOLN
Freq: Once | INTRAVENOUS | Status: AC
  Filled 2021-12-07: qty 1000

## 2021-12-07 MED ORDER — MAGNESIUM SULFATE 2 GM/50ML IV SOLN
2.0000 g | Freq: Once | INTRAVENOUS | Status: AC
  Administered 2021-12-07: 2 g via INTRAVENOUS
  Filled 2021-12-07: qty 50

## 2021-12-07 MED ORDER — SODIUM CHLORIDE 0.9 % IV SOLN: Freq: Once | INTRAVENOUS | Status: AC

## 2021-12-07 NOTE — Progress Notes (Signed)
[place] ?OFFICE PROGRESS NOTE ? ?Patient Care Team: ?Hamrick, Lorin Mercy, MD as PCP - General (Family Medicine) ?Heath Lark, MD as Consulting Physician (Hematology and Oncology) ? ?ASSESSMENT & PLAN:  ?Laryngeal squamous cell carcinoma (HCC) ?Her last cardiac MRI showed stable size of cancer in the ventricle ?She is not symptomatic ?She is developing slight worsening pancytopenia ?I plan to reduce the dose of treatment a little further and change her treatment to every other week ?We will repeat MRI again in May for further follow-up and assessment of response to treatment ? ?Cancer associated pain ?She has stable pain control ?She will continue the same ? ?Pancytopenia, acquired ([place]) ?This is due to her treatment ?I have checked serum vitamin B12 over a year ago and that was within normal range ?She will continue modified dosing with treatment every other week ?She is in agreement with the plan of care ? ?No orders of the defined types were placed in this encounter. ? ? ?All questions were answered. The patient knows to call the clinic with any problems, questions or concerns. ?The total time spent in the appointment was 20 minutes encounter with patients including review of chart and various tests results, discussions about plan of care and coordination of care plan ?  ?Heath Lark, MD ?12/07/2021 12:43 PM ? ?INTERVAL HISTORY: ?Please see below for problem oriented charting. ?she returns for treatment follow-up on cisplatin for recurrent head and neck cancer ?She is doing well ?Mucositis has resolved ?Her chronic pain is stable ?She has no bleeding complications from anticoagulation therapy ? ?REVIEW OF SYSTEMS:   ?Constitutional: Denies fevers, chills or abnormal weight loss ?Eyes: Denies blurriness of vision ?Ears, nose, mouth, throat, and face: Denies mucositis or sore throat ?Respiratory: Denies cough, dyspnea or wheezes ?Cardiovascular: Denies palpitation, chest discomfort or lower extremity  swelling ?Gastrointestinal:  Denies nausea, heartburn or change in bowel habits ?Skin: Denies abnormal skin rashes ?Lymphatics: Denies new lymphadenopathy or easy bruising ?Neurological:Denies numbness, tingling or new weaknesses ?Behavioral/Psych: Mood is stable, no new changes  ?All other systems were reviewed with the patient and are negative. ? ?I have reviewed the past medical history, past surgical history, social history and family history with the patient and they are unchanged from previous note. ? ?ALLERGIES:  has no active allergies. ? ?MEDICATIONS:  ?Current Outpatient Medications  ?Medication Sig Dispense Refill  ? apixaban (ELIQUIS) 2.5 MG TABS tablet Take 1 tablet (2.5 mg total) by mouth 2 (two) times daily. 60 tablet 3  ? Cholecalciferol (VITAMIN D3) 125 MCG (5000 UT) CAPS Take 5,000 Units by mouth daily.    ? HYDROcodone-acetaminophen (NORCO/VICODIN) 5-325 MG tablet TAKE 1 TABLET BY MOUTH EVERY 6 HOURS AS NEEDED FOR MODERATE PAIN 60 tablet 0  ? levothyroxine (SYNTHROID) 137 MCG tablet Take 137 mcg by mouth daily before breakfast.    ? lidocaine-prilocaine (EMLA) cream Apply topically daily as needed. 30 g 9  ? Nutritional Supplements (KATE FARMS PEPTIDE 1.5) LIQD Take 4 Bottles by mouth daily. 325 mL 11  ? ondansetron (ZOFRAN) 8 MG tablet Take 1 tablet (8 mg total) by mouth every 8 (eight) hours as needed for nausea. 30 tablet 3  ? oxymetazoline (AFRIN) 0.05 % nasal spray Place 3 sprays into both nostrils 2 (two) times daily as needed (Bleeding from stoma). 30 mL 0  ? prochlorperazine (COMPAZINE) 10 MG tablet Take 1 tablet (10 mg total) by mouth every 6 (six) hours as needed for nausea or vomiting. 30 tablet 1  ? ?  No current facility-administered medications for this visit.  ? ?Facility-Administered Medications Ordered in Other Visits  ?Medication Dose Route Frequency Provider Last Rate Last Admin  ? CISplatin (PLATINOL) 50 mg in sodium chloride 0.9 % 250 mL chemo infusion  30 mg/m2 (Treatment Plan  Recorded) Intravenous Once Alvy Bimler, Brenden Rudman, MD      ? fosaprepitant (EMEND) 150 mg in sodium chloride 0.9 % 145 mL IVPB  150 mg Intravenous Once Alvy Bimler, Bradyn Vassey, MD 450 mL/hr at 12/07/21 1225 150 mg at 12/07/21 1225  ? heparin lock flush 100 unit/mL  500 Units Intracatheter Once PRN Heath Lark, MD      ? sodium chloride flush (NS) 0.9 % injection 10 mL  10 mL Intracatheter PRN Heath Lark, MD      ? ? ?SUMMARY OF ONCOLOGIC HISTORY: ?Oncology History  ?Laryngeal squamous cell carcinoma ([place])  ?12/02/2018 Imaging  ? CT neck: ?IMPRESSION: ?1. Supraglottic laryngeal carcinoma extending across the anterior commissure and invading the prelaryngeal fat, left aryepiglottic ?fold and left aspect of the epiglottis. ?2. Severe narrowing of the laryngeal airway. ?3. Bilateral subcentimeter level 2A and 2A cervical lymph nodes. ?  ?  ?12/02/2018 Imaging  ? CT  Chest: ? ?IMPRESSION: ?No evidence of metastatic disease in the chest. ?  ?12/03/2018 Pathology Results  ? (Care Everywhere; [facility]) ?A: Larynx, supraglottis, biopsy ?- Atypical squamous proliferation with at least low grade dysplasia and abundant keratinization ?- No definite high grade dysplasia or invasive carcinoma represented (see comment) ?  ?12/18/2018 Pathology Results  ? (Care Everywhere; [facility]) ?A:  Neck, left, fine needle aspiration ?- Rare single atypical cells, no cells diagnostic of malignancy ?- Lymph node material present ? ?B:  Neck, right, fine needle aspiration ?- Non-diagnostic specimen ?- No lymph node material identified ?- Mixed inflammation and blood ?  ?12/31/2018 Pathology Results  ? (Care Everywhere; [facility]) ? ?A: Larynx, left supraglottic mass, biopsy ?- Atypical squamous proliferation with abundant keratinization, suspicious for well-differentiated invasive squamous cell carcinoma ? ?B: Larynx, left supraglottic mass, biopsy ?- Well-differentiated invasive squamous cell carcinoma with abundant keratinization (see comment) ?  ?01/18/2019  Initial Diagnosis  ? Laryngeal cancer [facility]) ?  ?01/26/2019 Imaging  ? 1. Locally advanced hypermetabolic laryngeal neoplasm centered in the left supraglottic space with bilateral glottic and epiglottic involvement and subglottic extension on the left. ?2. Hypermetabolic bilateral level 3 neck nodal metastases. ?3. Hypermetabolic high mediastinal nodal metastases between the trachea and upper thoracic esophagus. ?4. Otherwise no distant hypermetabolic metastatic disease. ?5. Nodular 7 mm focus at the left lung base, below PET resolution, recommend attention on follow-up chest CT in 3 months. ?6. Chronic findings include: Aortic Atherosclerosis (ICD10-I70.0). Cholelithiasis. ?  ?02/08/2019 Cancer Staging  ? Staging form: Larynx - Supraglottis, AJCC 8th Edition ?- Clinical stage from 02/08/2019: Stage IVA (cT3, cN2c, cM0) - Signed by Eppie Gibson, MD on 02/08/2019 ? ?  ?02/11/2019 - 04/07/2019 Chemotherapy  ? The patient weekly cisplatin for chemotherapy treatment.  ? ?  ?07/20/2019 Pathology Results  ? PROCEDURE: Total glossectomy, total pharyngectomy, total ?laryngectomy, total thyroidectomy, and cervical esophagectomy ?TUMOR SITE: Larynx ?TUMOR LATERALITY: Not specified ?TUMOR FOCALITY: Unifocal ?TUMOR SIZE: ?   GREATEST DIMENSION: 6.9 cm ?   ADDITIONAL DIMENSIONS: 5.4 x 2.4 cm ?HISTOLOGIC TYPE: Squamous cell carcinoma, conventional ?(keratinizing) ?HISTOLOGIC GRADE:  G1-G2: Well to moderately differentiated ?TUMOR EXTENSION: Tumor involves the posterior tongue, pharynx, ?larynx, tracheal cartilage, hyoid bone, cervical esophagus, vagus ?nerve, carotid artery, deep cervical fascia, ans surrounding ?skeletal muscle and soft  tissue ?MARGINS: Uninvolved by invasive tumor; Perineural invasion is ?less than 58mm from the left soft tissue margin ?LYMPHOVASCULAR INVASION: Not identified ?PERINEURAL INVASION:  Present ?REGIONAL LYMPH NODES: ?    NUMBER OF LYMPH NODES INVOLVED: 2 ?    NUMBER OF LYMPH NODES EXAMINED: 24 ?     LATERALITY OF LYMPH NODES INVOLVED: Cannot determine ?    SIZE OF LARGEST METASTATIC DEPOSIT: 0.8 cm ?    EXTRANODAL EXTENSION: Not identified ?PATHOLOGIC STAGE CLASSIFICATION (pTNM, AJCC 8TH Ed): (r)(y) pT4b ?pN2a

## 2021-12-07 NOTE — Assessment & Plan Note (Signed)
She has stable pain control ?She will continue the same ?

## 2021-12-07 NOTE — Assessment & Plan Note (Signed)
This is due to her treatment ?I have checked serum vitamin B12 over a year ago and that was within normal range ?She will continue modified dosing with treatment every other week ?She is in agreement with the plan of care ?

## 2021-12-07 NOTE — Assessment & Plan Note (Signed)
Her last cardiac MRI showed stable size of cancer in the ventricle ?She is not symptomatic ?She is developing slight worsening pancytopenia ?I plan to reduce the dose of treatment a little further and change her treatment to every other week ?We will repeat MRI again in May for further follow-up and assessment of response to treatment ?

## 2021-12-07 NOTE — Patient Instructions (Signed)
[facility]COL[facility]  Discharge Instructions: Thank you for choosing [facility] to provide your oncology and hematology care.   If you have a lab appointment with the Cancer Center, please go directly to the Cancer Center and check in at the registration area.   Wear comfortable clothing and clothing appropriate for easy access to any Portacath or PICC line.   We strive to give you quality time with your provider. You may need to reschedule your appointment if you arrive late (15 or more minutes).  Arriving late affects you and other patients whose appointments are after yours.  Also, if you miss three or more appointments without notifying the office, you may be dismissed from the clinic at the provider's discretion.      For prescription refill requests, have your pharmacy contact our office and allow 72 hours for refills to be completed.    Today you received the following chemotherapy and/or immunotherapy agents : Cisplatin    To help prevent nausea and vomiting after your treatment, we encourage you to take your nausea medication as directed.  BELOW ARE SYMPTOMS THAT SHOULD BE REPORTED IMMEDIATELY: *FEVER GREATER THAN 100.4 F (38 C) OR HIGHER *CHILLS OR SWEATING *NAUSEA AND VOMITING THAT IS NOT CONTROLLED WITH YOUR NAUSEA MEDICATION *UNUSUAL SHORTNESS OF BREATH *UNUSUAL BRUISING OR BLEEDING *URINARY PROBLEMS (pain or burning when urinating, or frequent urination) *BOWEL PROBLEMS (unusual diarrhea, constipation, pain near the anus) TENDERNESS IN MOUTH AND THROAT WITH OR WITHOUT PRESENCE OF ULCERS (sore throat, sores in mouth, or a toothache) UNUSUAL RASH, SWELLING OR PAIN  UNUSUAL VAGINAL DISCHARGE OR ITCHING   Items with * indicate a potential emergency and should be followed up as soon as possible or go to the Emergency Department if any problems should occur.  Please show the CHEMOTHERAPY ALERT CARD or IMMUNOTHERAPY ALERT CARD at check-in to  the Emergency Department and triage nurse.  Should you have questions after your visit or need to cancel or reschedule your appointment, please contact [facility]  Dept: [phone]  and follow the prompts.  Office hours are 8:00 a.m. to 4:30 p.m. Monday - Friday. Please note that voicemails left after 4:00 p.m. may not be returned until the following business day.  We are closed weekends and major holidays. You have access to a nurse at all times for urgent questions. Please call the main number to the clinic Dept: [phone] and follow the prompts.   For any non-urgent questions, you may also contact your provider using MyChart. We now offer e-Visits for anyone 18 and older to request care online for non-urgent symptoms. For details visit [email or web address].   Also download the MyChart app! Go to the app store, search "MyChart", open the app, select , and log in with your MyChart username and password.  Due to Covid, a mask is required upon entering the hospital/clinic. If you do not have a mask, one will be given to you upon arrival. For doctor visits, patients may have 1 support person aged 18 or older with them. For treatment visits, patients cannot have anyone with them due to current Covid guidelines and our immunocompromised population.   

## 2021-12-08 NOTE — Progress Notes (Signed)
Nutrition Follow-up: ? ?Patient with recurrent laryngeal cancer. Single agent keytruda discontinued due to heart metastasis. She is currently receiving cisplatin q7 days (2 weeks on 1 week off to allow for blood count recovery) ? ?Met with patient during infusion. She continues to have fatigue after treatment. This is lasting 3-4 days. Patient continues to tolerate 5 cartons of [facility] both orally and via feeding tube. She is drinking 4 (8 oz) bottles of water by mouth and flushes with additional 3 (8 oz) bottles. She is not eating much by mouth. Sometimes she will have cream of wheat, but this gets stuck in her gums. Patient is looking forward to having her daughter and 3 grandchildren visit this weekend.  ? ? ?Medications: reviewed  ? ?Labs: reviewed  ? ?Anthropometrics: weight 142 lb 11.2 oz today stable ? ? ?NUTRITION DIAGNOSIS: Food and nutrition related knowledge deficit improved  ? ? ? ?INTERVENTION:  ?Continue [employer] 1.5 daily ?  ? ?MONITORING, EVALUATION, GOAL: weight trends, intake  ? ? ?NEXT VISIT: Friday April 28 during infusion ? ? ?

## 2021-12-09 ENCOUNTER — Encounter: Payer: Self-pay | Admitting: Hematology and Oncology

## 2021-12-21 ENCOUNTER — Inpatient Hospital Stay: Payer: Medicare HMO

## 2021-12-21 ENCOUNTER — Other Ambulatory Visit (HOSPITAL_COMMUNITY): Payer: Self-pay

## 2021-12-21 ENCOUNTER — Other Ambulatory Visit: Payer: Self-pay

## 2021-12-21 ENCOUNTER — Other Ambulatory Visit: Payer: Self-pay | Admitting: *Deleted

## 2021-12-21 ENCOUNTER — Telehealth: Payer: Self-pay

## 2021-12-21 VITALS — BP 136/77 | HR 88 | Temp 97.9°F | Resp 16 | Wt 140.5 lb

## 2021-12-21 DIAGNOSIS — G893 Neoplasm related pain (acute) (chronic): Secondary | ICD-10-CM | POA: Diagnosis not present

## 2021-12-21 DIAGNOSIS — C321 Malignant neoplasm of supraglottis: Secondary | ICD-10-CM | POA: Diagnosis not present

## 2021-12-21 DIAGNOSIS — D61810 Antineoplastic chemotherapy induced pancytopenia: Secondary | ICD-10-CM | POA: Diagnosis not present

## 2021-12-21 DIAGNOSIS — C7989 Secondary malignant neoplasm of other specified sites: Secondary | ICD-10-CM | POA: Diagnosis not present

## 2021-12-21 DIAGNOSIS — K1231 Oral mucositis (ulcerative) due to antineoplastic therapy: Secondary | ICD-10-CM | POA: Diagnosis not present

## 2021-12-21 DIAGNOSIS — T451X5D Adverse effect of antineoplastic and immunosuppressive drugs, subsequent encounter: Secondary | ICD-10-CM | POA: Diagnosis not present

## 2021-12-21 DIAGNOSIS — I513 Intracardiac thrombosis, not elsewhere classified: Secondary | ICD-10-CM | POA: Diagnosis not present

## 2021-12-21 DIAGNOSIS — Z95828 Presence of other vascular implants and grafts: Secondary | ICD-10-CM

## 2021-12-21 DIAGNOSIS — C329 Malignant neoplasm of larynx, unspecified: Secondary | ICD-10-CM

## 2021-12-21 DIAGNOSIS — Z5111 Encounter for antineoplastic chemotherapy: Secondary | ICD-10-CM | POA: Diagnosis not present

## 2021-12-21 DIAGNOSIS — E559 Vitamin D deficiency, unspecified: Secondary | ICD-10-CM | POA: Diagnosis not present

## 2021-12-21 DIAGNOSIS — Z7901 Long term (current) use of anticoagulants: Secondary | ICD-10-CM | POA: Diagnosis not present

## 2021-12-21 LAB — CBC WITH DIFFERENTIAL (CANCER CENTER ONLY)
Abs Immature Granulocytes: 0 10*3/uL (ref 0.00–0.07)
Basophils Absolute: 0 10*3/uL (ref 0.0–0.1)
Basophils Relative: 0 %
Eosinophils Absolute: 0.2 10*3/uL (ref 0.0–0.5)
Eosinophils Relative: 4 %
HCT: 31.3 % — ABNORMAL LOW (ref 36.0–46.0)
Hemoglobin: 10.4 g/dL — ABNORMAL LOW (ref 12.0–15.0)
Immature Granulocytes: 0 %
Lymphocytes Relative: 26 %
Lymphs Abs: 0.9 10*3/uL (ref 0.7–4.0)
MCH: 31.5 pg (ref 26.0–34.0)
MCHC: 33.2 g/dL (ref 30.0–36.0)
MCV: 94.8 fL (ref 80.0–100.0)
Monocytes Absolute: 0.5 10*3/uL (ref 0.1–1.0)
Monocytes Relative: 13 %
Neutro Abs: 2.1 10*3/uL (ref 1.7–7.7)
Neutrophils Relative %: 57 %
Platelet Count: 129 10*3/uL — ABNORMAL LOW (ref 150–400)
RBC: 3.3 MIL/uL — ABNORMAL LOW (ref 3.87–5.11)
RDW: 13.4 % (ref 11.5–15.5)
WBC Count: 3.6 10*3/uL — ABNORMAL LOW (ref 4.0–10.5)
nRBC: 0 % (ref 0.0–0.2)

## 2021-12-21 LAB — BASIC METABOLIC PANEL - CANCER CENTER ONLY
Anion gap: 8 (ref 5–15)
BUN: 32 mg/dL — ABNORMAL HIGH (ref 8–23)
CO2: 26 mmol/L (ref 22–32)
Calcium: 8.7 mg/dL — ABNORMAL LOW (ref 8.9–10.3)
Chloride: 104 mmol/L (ref 98–111)
Creatinine: 0.82 mg/dL (ref 0.44–1.00)
GFR, Estimated: >60 mL/min (ref >60)
Glucose, Bld: 99 mg/dL (ref 70–99)
Potassium: 4.5 mmol/L (ref 3.5–5.1)
Sodium: 138 mmol/L (ref 135–145)

## 2021-12-21 LAB — MAGNESIUM: Magnesium: 2.3 mg/dL (ref 1.7–2.4)

## 2021-12-21 MED ORDER — SODIUM CHLORIDE 0.9% FLUSH
10.0000 mL | INTRAVENOUS | Status: DC | PRN
  Administered 2021-12-21: 10 mL

## 2021-12-21 MED ORDER — SODIUM CHLORIDE 0.9% FLUSH
10.0000 mL | Freq: Once | INTRAVENOUS | Status: AC
  Administered 2021-12-21: 10 mL

## 2021-12-21 MED ORDER — POTASSIUM CHLORIDE IN NACL 20-0.9 MEQ/L-% IV SOLN
Freq: Once | INTRAVENOUS | Status: AC
  Filled 2021-12-21: qty 1000

## 2021-12-21 MED ORDER — HEPARIN SOD (PORK) LOCK FLUSH 100 UNIT/ML IV SOLN
500.0000 [IU] | Freq: Once | INTRAVENOUS | Status: AC | PRN
  Administered 2021-12-21: 500 [IU]

## 2021-12-21 MED ORDER — MAGNESIUM SULFATE 2 GM/50ML IV SOLN
2.0000 g | Freq: Once | INTRAVENOUS | Status: AC
  Administered 2021-12-21: 2 g via INTRAVENOUS
  Filled 2021-12-21: qty 50

## 2021-12-21 MED ORDER — SODIUM CHLORIDE 0.9 % IV SOLN
30.0000 mg/m2 | Freq: Once | INTRAVENOUS | Status: AC
  Administered 2021-12-21: 50 mg via INTRAVENOUS
  Filled 2021-12-21: qty 50

## 2021-12-21 MED ORDER — SODIUM CHLORIDE 0.9 % IV SOLN
150.0000 mg | Freq: Once | INTRAVENOUS | Status: AC
  Administered 2021-12-21: 150 mg via INTRAVENOUS
  Filled 2021-12-21: qty 150

## 2021-12-21 MED ORDER — PALONOSETRON HCL INJECTION 0.25 MG/5ML
0.2500 mg | Freq: Once | INTRAVENOUS | Status: AC
  Administered 2021-12-21: 0.25 mg via INTRAVENOUS
  Filled 2021-12-21: qty 5

## 2021-12-21 MED ORDER — SODIUM CHLORIDE 0.9 % IV SOLN
10.0000 mg | Freq: Once | INTRAVENOUS | Status: AC
  Administered 2021-12-21: 10 mg via INTRAVENOUS
  Filled 2021-12-21: qty 10

## 2021-12-21 MED ORDER — SODIUM CHLORIDE 0.9 % IV SOLN: Freq: Once | INTRAVENOUS | Status: AC

## 2021-12-21 NOTE — Patient Instructions (Signed)
[place]  Discharge Instructions: ?Thank you for choosing [facility] to provide your oncology and hematology care.  ? ?If you have a lab appointment with the [facility], please go directly to the [facility] and check in at the registration area. ?  ?Wear comfortable clothing and clothing appropriate for easy access to any Portacath or PICC line.  ? ?We strive to give you quality time with your provider. You may need to reschedule your appointment if you arrive late (15 or more minutes).  Arriving late affects you and other patients whose appointments are after yours.  Also, if you miss three or more appointments without notifying the office, you may be dismissed from the clinic at the provider?s discretion.    ?  ?For prescription refill requests, have your pharmacy contact our office and allow 72 hours for refills to be completed.   ? ?Today you received the following chemotherapy and/or immunotherapy agent: Cisplatin ?  ?To help prevent nausea and vomiting after your treatment, we encourage you to take your nausea medication as directed. ? ?BELOW ARE SYMPTOMS THAT SHOULD BE REPORTED IMMEDIATELY: ?*FEVER GREATER THAN 100.4 F (38 ?C) OR HIGHER ?*CHILLS OR SWEATING ?*NAUSEA AND VOMITING THAT IS NOT CONTROLLED WITH YOUR NAUSEA MEDICATION ?*UNUSUAL SHORTNESS OF BREATH ?*UNUSUAL BRUISING OR BLEEDING ?*URINARY PROBLEMS (pain or burning when urinating, or frequent urination) ?*BOWEL PROBLEMS (unusual diarrhea, constipation, pain near the anus) ?TENDERNESS IN MOUTH AND THROAT WITH OR WITHOUT PRESENCE OF ULCERS (sore throat, sores in mouth, or a toothache) ?UNUSUAL RASH, SWELLING OR PAIN  ?UNUSUAL VAGINAL DISCHARGE OR ITCHING  ? ?Items with * indicate a potential emergency and should be followed up as soon as possible or go to the Emergency Department if any problems should occur. ? ?Please show the CHEMOTHERAPY ALERT CARD or IMMUNOTHERAPY ALERT CARD at check-in to the  Emergency Department and triage nurse. ? ?Should you have questions after your visit or need to cancel or reschedule your appointment, please contact [facility]  Dept: [phone]  and follow the prompts.  Office hours are 8:00 a.m. to 4:30 p.m. Monday - Friday. Please note that voicemails left after 4:00 p.m. may not be returned until the following business day.  We are closed weekends and major holidays. You have access to a nurse at all times for urgent questions. Please call the main number to the clinic Dept: [phone] and follow the prompts. ? ? ?For any non-urgent questions, you may also contact your provider using MyChart. We now offer e-Visits for anyone 72 and older to request care online for non-urgent symptoms. For details visit [email or web address]. ?  ?Also download the MyChart app! Go to the app store, search "MyChart", open the app, select [facility], and log in with your MyChart username and password. ? ?Due to Covid, a mask is required upon entering the hospital/clinic. If you do not have a mask, one will be given to you upon arrival. For doctor visits, patients may have 1 support person aged 72 or older with them. For treatment visits, patients cannot have anyone with them due to current Covid guidelines and our immunocompromised population.  ? ?

## 2021-12-21 NOTE — Telephone Encounter (Signed)
Entered in error

## 2021-12-24 ENCOUNTER — Telehealth: Payer: Self-pay

## 2021-12-24 ENCOUNTER — Other Ambulatory Visit (HOSPITAL_COMMUNITY): Payer: Self-pay

## 2021-12-24 ENCOUNTER — Other Ambulatory Visit: Payer: Self-pay | Admitting: Hematology and Oncology

## 2021-12-24 MED ORDER — APIXABAN 2.5 MG PO TABS
2.5000 mg | ORAL_TABLET | Freq: Two times a day (BID) | ORAL | 3 refills | Status: DC
  Filled 2021-12-24: qty 60, 30d supply, fill #0
  Filled 2022-01-22: qty 60, 30d supply, fill #1
  Filled 2022-02-19: qty 60, 30d supply, fill #2
  Filled 2022-03-19: qty 60, 30d supply, fill #3

## 2021-12-24 NOTE — Telephone Encounter (Signed)
Called to follow up from Friday and spoke with [facility]. [name] [name] is still taking the Eliquis Rx. Refill sent to [facility] outpatient pharmacy and family will pick up Rx. ?

## 2021-12-25 ENCOUNTER — Other Ambulatory Visit (HOSPITAL_COMMUNITY): Payer: Self-pay

## 2022-01-04 ENCOUNTER — Inpatient Hospital Stay: Payer: Medicare HMO

## 2022-01-04 ENCOUNTER — Inpatient Hospital Stay: Payer: Medicare HMO | Attending: Hematology

## 2022-01-04 ENCOUNTER — Encounter: Payer: Self-pay | Admitting: Hematology and Oncology

## 2022-01-04 ENCOUNTER — Inpatient Hospital Stay: Payer: Medicare HMO | Admitting: Hematology and Oncology

## 2022-01-04 ENCOUNTER — Other Ambulatory Visit: Payer: Self-pay

## 2022-01-04 ENCOUNTER — Other Ambulatory Visit: Payer: Self-pay | Admitting: Hematology and Oncology

## 2022-01-04 DIAGNOSIS — C7989 Secondary malignant neoplasm of other specified sites: Secondary | ICD-10-CM | POA: Diagnosis not present

## 2022-01-04 DIAGNOSIS — C321 Malignant neoplasm of supraglottis: Secondary | ICD-10-CM | POA: Insufficient documentation

## 2022-01-04 DIAGNOSIS — E559 Vitamin D deficiency, unspecified: Secondary | ICD-10-CM | POA: Insufficient documentation

## 2022-01-04 DIAGNOSIS — Z5111 Encounter for antineoplastic chemotherapy: Secondary | ICD-10-CM | POA: Insufficient documentation

## 2022-01-04 DIAGNOSIS — C329 Malignant neoplasm of larynx, unspecified: Secondary | ICD-10-CM | POA: Diagnosis not present

## 2022-01-04 DIAGNOSIS — Z95828 Presence of other vascular implants and grafts: Secondary | ICD-10-CM

## 2022-01-04 DIAGNOSIS — I513 Intracardiac thrombosis, not elsewhere classified: Secondary | ICD-10-CM

## 2022-01-04 DIAGNOSIS — G893 Neoplasm related pain (acute) (chronic): Secondary | ICD-10-CM | POA: Insufficient documentation

## 2022-01-04 DIAGNOSIS — Z930 Tracheostomy status: Secondary | ICD-10-CM | POA: Insufficient documentation

## 2022-01-04 DIAGNOSIS — D61810 Antineoplastic chemotherapy induced pancytopenia: Secondary | ICD-10-CM | POA: Insufficient documentation

## 2022-01-04 DIAGNOSIS — Z7901 Long term (current) use of anticoagulants: Secondary | ICD-10-CM | POA: Insufficient documentation

## 2022-01-04 DIAGNOSIS — E039 Hypothyroidism, unspecified: Secondary | ICD-10-CM

## 2022-01-04 DIAGNOSIS — T451X5D Adverse effect of antineoplastic and immunosuppressive drugs, subsequent encounter: Secondary | ICD-10-CM | POA: Insufficient documentation

## 2022-01-04 DIAGNOSIS — D61818 Other pancytopenia: Secondary | ICD-10-CM | POA: Diagnosis not present

## 2022-01-04 LAB — BASIC METABOLIC PANEL - CANCER CENTER ONLY
Anion gap: 8 (ref 5–15)
BUN: 32 mg/dL — ABNORMAL HIGH (ref 8–23)
CO2: 25 mmol/L (ref 22–32)
Calcium: 8.7 mg/dL — ABNORMAL LOW (ref 8.9–10.3)
Chloride: 103 mmol/L (ref 98–111)
Creatinine: 0.95 mg/dL (ref 0.44–1.00)
GFR, Estimated: >60 mL/min (ref >60)
Glucose, Bld: 96 mg/dL (ref 70–99)
Potassium: 4.3 mmol/L (ref 3.5–5.1)
Sodium: 136 mmol/L (ref 135–145)

## 2022-01-04 LAB — CBC WITH DIFFERENTIAL (CANCER CENTER ONLY)
Abs Immature Granulocytes: 0 10*3/uL (ref 0.00–0.07)
Basophils Absolute: 0 10*3/uL (ref 0.0–0.1)
Basophils Relative: 0 %
Eosinophils Absolute: 0.1 10*3/uL (ref 0.0–0.5)
Eosinophils Relative: 4 %
HCT: 31.4 % — ABNORMAL LOW (ref 36.0–46.0)
Hemoglobin: 10.4 g/dL — ABNORMAL LOW (ref 12.0–15.0)
Immature Granulocytes: 0 %
Lymphocytes Relative: 32 %
Lymphs Abs: 0.9 10*3/uL (ref 0.7–4.0)
MCH: 31.6 pg (ref 26.0–34.0)
MCHC: 33.1 g/dL (ref 30.0–36.0)
MCV: 95.4 fL (ref 80.0–100.0)
Monocytes Absolute: 0.5 10*3/uL (ref 0.1–1.0)
Monocytes Relative: 16 %
Neutro Abs: 1.4 10*3/uL — ABNORMAL LOW (ref 1.7–7.7)
Neutrophils Relative %: 48 %
Platelet Count: 138 10*3/uL — ABNORMAL LOW (ref 150–400)
RBC: 3.29 MIL/uL — ABNORMAL LOW (ref 3.87–5.11)
RDW: 13.2 % (ref 11.5–15.5)
WBC Count: 2.9 10*3/uL — ABNORMAL LOW (ref 4.0–10.5)
nRBC: 0 % (ref 0.0–0.2)

## 2022-01-04 LAB — TSH: TSH: 0.429 u[IU]/mL (ref 0.308–3.960)

## 2022-01-04 LAB — MAGNESIUM: Magnesium: 2.4 mg/dL (ref 1.7–2.4)

## 2022-01-04 MED ORDER — SODIUM CHLORIDE 0.9 % IV SOLN: Freq: Once | INTRAVENOUS | Status: AC

## 2022-01-04 MED ORDER — SODIUM CHLORIDE 0.9 % IV SOLN
150.0000 mg | Freq: Once | INTRAVENOUS | Status: AC
  Administered 2022-01-04: 150 mg via INTRAVENOUS
  Filled 2022-01-04: qty 150

## 2022-01-04 MED ORDER — SODIUM CHLORIDE 0.9 % IV SOLN
30.0000 mg/m2 | Freq: Once | INTRAVENOUS | Status: AC
  Administered 2022-01-04: 50 mg via INTRAVENOUS
  Filled 2022-01-04: qty 50

## 2022-01-04 MED ORDER — SODIUM CHLORIDE 0.9% FLUSH
10.0000 mL | Freq: Once | INTRAVENOUS | Status: AC
  Administered 2022-01-04: 10 mL

## 2022-01-04 MED ORDER — SODIUM CHLORIDE 0.9 % IV SOLN
10.0000 mg | Freq: Once | INTRAVENOUS | Status: AC
  Administered 2022-01-04: 10 mg via INTRAVENOUS
  Filled 2022-01-04: qty 10

## 2022-01-04 MED ORDER — MAGNESIUM SULFATE 2 GM/50ML IV SOLN
2.0000 g | Freq: Once | INTRAVENOUS | Status: AC
  Administered 2022-01-04: 2 g via INTRAVENOUS
  Filled 2022-01-04: qty 50

## 2022-01-04 MED ORDER — PALONOSETRON HCL INJECTION 0.25 MG/5ML
0.2500 mg | Freq: Once | INTRAVENOUS | Status: AC
  Administered 2022-01-04: 0.25 mg via INTRAVENOUS
  Filled 2022-01-04: qty 5

## 2022-01-04 MED ORDER — POTASSIUM CHLORIDE IN NACL 20-0.9 MEQ/L-% IV SOLN
Freq: Once | INTRAVENOUS | Status: AC
  Filled 2022-01-04: qty 1000

## 2022-01-04 NOTE — Assessment & Plan Note (Signed)
This is due to her treatment ?I have checked serum vitamin B12 over a year ago and that was within normal range ?She will continue modified dosing with treatment every other week ?She is in agreement with the plan of care ?

## 2022-01-04 NOTE — Assessment & Plan Note (Addendum)
She has stable pain control ?She will continue the same medications for pain ?

## 2022-01-04 NOTE — Progress Notes (Signed)
[place] ?OFFICE PROGRESS NOTE ? ?Patient Care Team: ?Hamrick, Lorin Mercy, MD as PCP - General (Family Medicine) ?Heath Lark, MD as Consulting Physician (Hematology and Oncology) ? ?ASSESSMENT & PLAN:  ?Laryngeal squamous cell carcinoma (HCC) ?This patient have recurrent metastatic cancer to the heart, which is very unusual ?Her last cardiac MRI showed stable size of cancer in the ventricle ?She is not symptomatic ?She is developing slight worsening pancytopenia ?She tolerated reduced dose treatment and modified treatment cycle every other week ?We will repeat MRI again in May for further follow-up and assessment of response to treatment ? ?Cancer associated pain ?She has stable pain control ?She will continue the same medications for pain ? ?Pancytopenia, acquired ([place]) ?This is due to her treatment ?I have checked serum vitamin B12 over a year ago and that was within normal range ?She will continue modified dosing with treatment every other week ?She is in agreement with the plan of care ? ?RV (right ventricular) mural thrombus ?She will continue anticoagulation therapy to reduce risk of embolic phenomena ? ?Orders Placed This Encounter  ?Procedures  ? MR CARDIAC MORPHOLOGY W WO CONTRAST  ?  Standing Status:   Future  ?  Standing Expiration Date:   01/05/2023  ?  Order Specific Question:   If indicated for the ordered procedure, I authorize the administration of contrast media per Radiology protocol  ?  Answer:   Yes  ?  Order Specific Question:   What is the patient's sedation requirement?  ?  Answer:   No Sedation  ?  Order Specific Question:   Does the patient have a pacemaker or implanted devices?  ?  Answer:   No  ?  Order Specific Question:   Preferred imaging location?  ?  Answer:   [facility] (table limit - 500 lbs)  ? ? ?All questions were answered. The patient knows to call the clinic with any problems, questions or concerns. ?The total time spent in the appointment was 30 minutes  encounter with patients including review of chart and various tests results, discussions about plan of care and coordination of care plan ?  ?Heath Lark, MD ?01/04/2022 10:42 AM ? ?INTERVAL HISTORY: ?Please see below for problem oriented charting. ?she returns for treatment follow-up with her son ?She is receiving cisplatin in a palliative fashion for metastatic cancer to the heart ?She denies recent bleeding complications except for occasional oozing at her tracheostomy site ?Her feeding tube appears to be working well ?Denies mucositis ?Her chronic pain is well controlled ?Denies peripheral neuropathy ? ?REVIEW OF SYSTEMS:   ?Constitutional: Denies fevers, chills or abnormal weight loss ?Eyes: Denies blurriness of vision ?Ears, nose, mouth, throat, and face: Denies mucositis or sore throat ?Respiratory: Denies cough, dyspnea or wheezes ?Cardiovascular: Denies palpitation, chest discomfort or lower extremity swelling ?Gastrointestinal:  Denies nausea, heartburn or change in bowel habits ?Skin: Denies abnormal skin rashes ?Lymphatics: Denies new lymphadenopathy or easy bruising ?Neurological:Denies numbness, tingling or new weaknesses ?Behavioral/Psych: Mood is stable, no new changes  ?All other systems were reviewed with the patient and are negative. ? ?I have reviewed the past medical history, past surgical history, social history and family history with the patient and they are unchanged from previous note. ? ?ALLERGIES:  has no active allergies. ? ?MEDICATIONS:  ?Current Outpatient Medications  ?Medication Sig Dispense Refill  ? apixaban (ELIQUIS) 2.5 MG TABS tablet Take 1 tablet (2.5 mg total) by mouth 2 (two) times daily. 60 tablet 3  ?  Cholecalciferol (VITAMIN D3) 125 MCG (5000 UT) CAPS Take 5,000 Units by mouth daily.    ? HYDROcodone-acetaminophen (NORCO/VICODIN) 5-325 MG tablet TAKE 1 TABLET BY MOUTH EVERY 6 HOURS AS NEEDED FOR MODERATE PAIN 60 tablet 0  ? levothyroxine (SYNTHROID) 137 MCG tablet Take 137  mcg by mouth daily before breakfast.    ? lidocaine-prilocaine (EMLA) cream Apply topically daily as needed. 30 g 9  ? Nutritional Supplements (KATE FARMS PEPTIDE 1.5) LIQD Take 4 Bottles by mouth daily. 325 mL 11  ? ondansetron (ZOFRAN) 8 MG tablet Take 1 tablet (8 mg total) by mouth every 8 (eight) hours as needed for nausea. 30 tablet 3  ? oxymetazoline (AFRIN) 0.05 % nasal spray Place 3 sprays into both nostrils 2 (two) times daily as needed (Bleeding from stoma). 30 mL 0  ? prochlorperazine (COMPAZINE) 10 MG tablet Take 1 tablet (10 mg total) by mouth every 6 (six) hours as needed for nausea or vomiting. 30 tablet 1  ? ?No current facility-administered medications for this visit.  ? ?Facility-Administered Medications Ordered in Other Visits  ?Medication Dose Route Frequency Provider Last Rate Last Admin  ? CISplatin (PLATINOL) 50 mg in sodium chloride 0.9 % 250 mL chemo infusion  30 mg/m2 (Treatment Plan Recorded) Intravenous Once Heath Lark, MD      ? dexamethasone (DECADRON) 10 mg in sodium chloride 0.9 % 50 mL IVPB  10 mg Intravenous Once Alvy Bimler, Harley Fitzwater, MD      ? fosaprepitant (EMEND) 150 mg in sodium chloride 0.9 % 145 mL IVPB  150 mg Intravenous Once Alvy Bimler, Stephanie Mcglone, MD      ? palonosetron (ALOXI) injection 0.25 mg  0.25 mg Intravenous Once Heath Lark, MD      ? ? ?SUMMARY OF ONCOLOGIC HISTORY: ?Oncology History  ?Laryngeal squamous cell carcinoma ([place])  ?12/02/2018 Imaging  ? CT neck: ?IMPRESSION: ?1. Supraglottic laryngeal carcinoma extending across the anterior commissure and invading the prelaryngeal fat, left aryepiglottic ?fold and left aspect of the epiglottis. ?2. Severe narrowing of the laryngeal airway. ?3. Bilateral subcentimeter level 2A and 2A cervical lymph nodes. ?  ?  ?12/02/2018 Imaging  ? CT  Chest: ? ?IMPRESSION: ?No evidence of metastatic disease in the chest. ?  ?12/03/2018 Pathology Results  ? (Care Everywhere; [facility]) ?A: Larynx, supraglottis, biopsy ?- Atypical squamous proliferation  with at least low grade dysplasia and abundant keratinization ?- No definite high grade dysplasia or invasive carcinoma represented (see comment) ?  ?12/18/2018 Pathology Results  ? (Care Everywhere; [facility]) ?A:  Neck, left, fine needle aspiration ?- Rare single atypical cells, no cells diagnostic of malignancy ?- Lymph node material present ? ?B:  Neck, right, fine needle aspiration ?- Non-diagnostic specimen ?- No lymph node material identified ?- Mixed inflammation and blood ?  ?12/31/2018 Pathology Results  ? (Care Everywhere; [facility]) ? ?A: Larynx, left supraglottic mass, biopsy ?- Atypical squamous proliferation with abundant keratinization, suspicious for well-differentiated invasive squamous cell carcinoma ? ?B: Larynx, left supraglottic mass, biopsy ?- Well-differentiated invasive squamous cell carcinoma with abundant keratinization (see comment) ?  ?01/18/2019 Initial Diagnosis  ? Laryngeal cancer [facility]) ?  ?01/26/2019 Imaging  ? 1. Locally advanced hypermetabolic laryngeal neoplasm centered in the left supraglottic space with bilateral glottic and epiglottic involvement and subglottic extension on the left. ?2. Hypermetabolic bilateral level 3 neck nodal metastases. ?3. Hypermetabolic high mediastinal nodal metastases between the trachea and upper thoracic esophagus. ?4. Otherwise no distant hypermetabolic metastatic disease. ?5. Nodular 7 mm focus at  the left lung base, below PET resolution, recommend attention on follow-up chest CT in 3 months. ?6. Chronic findings include: Aortic Atherosclerosis (ICD10-I70.0). Cholelithiasis. ?  ?02/08/2019 Cancer Staging  ? Staging form: Larynx - Supraglottis, AJCC 8th Edition ?- Clinical stage from 02/08/2019: Stage IVA (cT3, cN2c, cM0) - Signed by Eppie Gibson, MD on 02/08/2019 ? ?  ?02/11/2019 - 04/07/2019 Chemotherapy  ? The patient weekly cisplatin for chemotherapy treatment.  ? ?  ?07/20/2019 Pathology Results  ? PROCEDURE: Total glossectomy, total pharyngectomy,  total ?laryngectomy, total thyroidectomy, and cervical esophagectomy ?TUMOR SITE: Larynx ?TUMOR LATERALITY: Not specified ?TUMOR FOCALITY: Unifocal ?TUMOR SIZE: ?   GREATEST DIMENSION: 6.9 cm ?   ADDIT

## 2022-01-04 NOTE — Assessment & Plan Note (Signed)
This patient have recurrent metastatic cancer to the heart, which is very unusual ?Her last cardiac MRI showed stable size of cancer in the ventricle ?She is not symptomatic ?She is developing slight worsening pancytopenia ?She tolerated reduced dose treatment and modified treatment cycle every other week ?We will repeat MRI again in May for further follow-up and assessment of response to treatment ?

## 2022-01-04 NOTE — Assessment & Plan Note (Signed)
She will continue anticoagulation therapy to reduce risk of embolic phenomena ?

## 2022-01-04 NOTE — Patient Instructions (Signed)
[facility]  Discharge Instructions: ?Thank you for choosing [facility] to provide your oncology and hematology care.  ? ?If you have a lab appointment with the [facility], please go directly to the [facility] and check in at the registration area. ?  ?Wear comfortable clothing and clothing appropriate for easy access to any Portacath or PICC line.  ? ?We strive to give you quality time with your provider. You may need to reschedule your appointment if you arrive late (15 or more minutes).  Arriving late affects you and other patients whose appointments are after yours.  Also, if you miss three or more appointments without notifying the office, you may be dismissed from the clinic at the provider?s discretion.    ?  ?For prescription refill requests, have your pharmacy contact our office and allow 72 hours for refills to be completed.   ? ?Today you received the following chemotherapy and/or immunotherapy agent: Cisplatin ?  ?To help prevent nausea and vomiting after your treatment, we encourage you to take your nausea medication as directed. ? ?BELOW ARE SYMPTOMS THAT SHOULD BE REPORTED IMMEDIATELY: ?*FEVER GREATER THAN 100.4 F (38 ?C) OR HIGHER ?*CHILLS OR SWEATING ?*NAUSEA AND VOMITING THAT IS NOT CONTROLLED WITH YOUR NAUSEA MEDICATION ?*UNUSUAL SHORTNESS OF BREATH ?*UNUSUAL BRUISING OR BLEEDING ?*URINARY PROBLEMS (pain or burning when urinating, or frequent urination) ?*BOWEL PROBLEMS (unusual diarrhea, constipation, pain near the anus) ?TENDERNESS IN MOUTH AND THROAT WITH OR WITHOUT PRESENCE OF ULCERS (sore throat, sores in mouth, or a toothache) ?UNUSUAL RASH, SWELLING OR PAIN  ?UNUSUAL VAGINAL DISCHARGE OR ITCHING  ? ?Items with * indicate a potential emergency and should be followed up as soon as possible or go to the Emergency Department if any problems should occur. ? ?Please show the CHEMOTHERAPY ALERT CARD or IMMUNOTHERAPY ALERT CARD at check-in to the  Emergency Department and triage nurse. ? ?Should you have questions after your visit or need to cancel or reschedule your appointment, please contact [facility]  Dept: [phone]  and follow the prompts.  Office hours are 8:00 a.m. to 4:30 p.m. Monday - Friday. Please note that voicemails left after 4:00 p.m. may not be returned until the following business day.  We are closed weekends and major holidays. You have access to a nurse at all times for urgent questions. Please call the main number to the clinic Dept: [phone] and follow the prompts. ? ? ?For any non-urgent questions, you may also contact your provider using MyChart. We now offer e-Visits for anyone 42 and older to request care online for non-urgent symptoms. For details visit [email or web address]. ?  ?Also download the MyChart app! Go to the app store, search "MyChart", open the app, select [facility], and log in with your MyChart username and password. ? ?Due to Covid, a mask is required upon entering the hospital/clinic. If you do not have a mask, one will be given to you upon arrival. For doctor visits, patients may have 1 support person aged 21 or older with them. For treatment visits, patients cannot have anyone with them due to current Covid guidelines and our immunocompromised population.  ? ?

## 2022-01-16 DIAGNOSIS — C321 Malignant neoplasm of supraglottis: Secondary | ICD-10-CM | POA: Diagnosis not present

## 2022-01-16 DIAGNOSIS — R633 Feeding difficulties, unspecified: Secondary | ICD-10-CM | POA: Diagnosis not present

## 2022-01-18 ENCOUNTER — Inpatient Hospital Stay: Payer: Medicare HMO

## 2022-01-18 ENCOUNTER — Other Ambulatory Visit: Payer: Self-pay

## 2022-01-18 ENCOUNTER — Inpatient Hospital Stay: Payer: Medicare HMO | Admitting: Dietician

## 2022-01-18 VITALS — BP 126/91 | HR 81 | Temp 97.6°F | Resp 16 | Wt 139.0 lb

## 2022-01-18 DIAGNOSIS — C7989 Secondary malignant neoplasm of other specified sites: Secondary | ICD-10-CM | POA: Diagnosis not present

## 2022-01-18 DIAGNOSIS — Z5111 Encounter for antineoplastic chemotherapy: Secondary | ICD-10-CM | POA: Diagnosis not present

## 2022-01-18 DIAGNOSIS — C321 Malignant neoplasm of supraglottis: Secondary | ICD-10-CM | POA: Diagnosis not present

## 2022-01-18 DIAGNOSIS — Z95828 Presence of other vascular implants and grafts: Secondary | ICD-10-CM

## 2022-01-18 DIAGNOSIS — D61810 Antineoplastic chemotherapy induced pancytopenia: Secondary | ICD-10-CM | POA: Diagnosis not present

## 2022-01-18 DIAGNOSIS — Z930 Tracheostomy status: Secondary | ICD-10-CM | POA: Diagnosis not present

## 2022-01-18 DIAGNOSIS — Z7901 Long term (current) use of anticoagulants: Secondary | ICD-10-CM | POA: Diagnosis not present

## 2022-01-18 DIAGNOSIS — T451X5D Adverse effect of antineoplastic and immunosuppressive drugs, subsequent encounter: Secondary | ICD-10-CM | POA: Diagnosis not present

## 2022-01-18 DIAGNOSIS — G893 Neoplasm related pain (acute) (chronic): Secondary | ICD-10-CM | POA: Diagnosis not present

## 2022-01-18 DIAGNOSIS — C329 Malignant neoplasm of larynx, unspecified: Secondary | ICD-10-CM

## 2022-01-18 DIAGNOSIS — E559 Vitamin D deficiency, unspecified: Secondary | ICD-10-CM | POA: Diagnosis not present

## 2022-01-18 DIAGNOSIS — I513 Intracardiac thrombosis, not elsewhere classified: Secondary | ICD-10-CM | POA: Diagnosis not present

## 2022-01-18 LAB — CBC WITH DIFFERENTIAL (CANCER CENTER ONLY)
Abs Immature Granulocytes: 0.01 10*3/uL (ref 0.00–0.07)
Basophils Absolute: 0 10*3/uL (ref 0.0–0.1)
Basophils Relative: 0 %
Eosinophils Absolute: 0.1 10*3/uL (ref 0.0–0.5)
Eosinophils Relative: 3 %
HCT: 31.3 % — ABNORMAL LOW (ref 36.0–46.0)
Hemoglobin: 10.3 g/dL — ABNORMAL LOW (ref 12.0–15.0)
Immature Granulocytes: 0 %
Lymphocytes Relative: 32 %
Lymphs Abs: 0.8 10*3/uL (ref 0.7–4.0)
MCH: 31 pg (ref 26.0–34.0)
MCHC: 32.9 g/dL (ref 30.0–36.0)
MCV: 94.3 fL (ref 80.0–100.0)
Monocytes Absolute: 0.4 10*3/uL (ref 0.1–1.0)
Monocytes Relative: 14 %
Neutro Abs: 1.3 10*3/uL — ABNORMAL LOW (ref 1.7–7.7)
Neutrophils Relative %: 51 %
Platelet Count: 127 10*3/uL — ABNORMAL LOW (ref 150–400)
RBC: 3.32 MIL/uL — ABNORMAL LOW (ref 3.87–5.11)
RDW: 13.2 % (ref 11.5–15.5)
WBC Count: 2.6 10*3/uL — ABNORMAL LOW (ref 4.0–10.5)
nRBC: 0 % (ref 0.0–0.2)

## 2022-01-18 LAB — BASIC METABOLIC PANEL - CANCER CENTER ONLY
Anion gap: 7 (ref 5–15)
BUN: 35 mg/dL — ABNORMAL HIGH (ref 8–23)
CO2: 25 mmol/L (ref 22–32)
Calcium: 8.8 mg/dL — ABNORMAL LOW (ref 8.9–10.3)
Chloride: 104 mmol/L (ref 98–111)
Creatinine: 0.94 mg/dL (ref 0.44–1.00)
GFR, Estimated: >60 mL/min (ref >60)
Glucose, Bld: 100 mg/dL — ABNORMAL HIGH (ref 70–99)
Potassium: 4.7 mmol/L (ref 3.5–5.1)
Sodium: 136 mmol/L (ref 135–145)

## 2022-01-18 LAB — MAGNESIUM: Magnesium: 2.3 mg/dL (ref 1.7–2.4)

## 2022-01-18 MED ORDER — SODIUM CHLORIDE 0.9 % IV SOLN: Freq: Once | INTRAVENOUS | Status: AC

## 2022-01-18 MED ORDER — HEPARIN SOD (PORK) LOCK FLUSH 100 UNIT/ML IV SOLN
500.0000 [IU] | Freq: Once | INTRAVENOUS | Status: AC | PRN
  Administered 2022-01-18: 500 [IU]

## 2022-01-18 MED ORDER — PALONOSETRON HCL INJECTION 0.25 MG/5ML
0.2500 mg | Freq: Once | INTRAVENOUS | Status: AC
  Administered 2022-01-18: 0.25 mg via INTRAVENOUS
  Filled 2022-01-18: qty 5

## 2022-01-18 MED ORDER — SODIUM CHLORIDE 0.9 % IV SOLN
10.0000 mg | Freq: Once | INTRAVENOUS | Status: AC
  Administered 2022-01-18: 10 mg via INTRAVENOUS
  Filled 2022-01-18: qty 10

## 2022-01-18 MED ORDER — SODIUM CHLORIDE 0.9% FLUSH
10.0000 mL | INTRAVENOUS | Status: DC | PRN
  Administered 2022-01-18: 10 mL

## 2022-01-18 MED ORDER — SODIUM CHLORIDE 0.9 % IV SOLN
150.0000 mg | Freq: Once | INTRAVENOUS | Status: AC
  Administered 2022-01-18: 150 mg via INTRAVENOUS
  Filled 2022-01-18: qty 150

## 2022-01-18 MED ORDER — POTASSIUM CHLORIDE IN NACL 20-0.9 MEQ/L-% IV SOLN
Freq: Once | INTRAVENOUS | Status: AC
  Filled 2022-01-18: qty 1000

## 2022-01-18 MED ORDER — MAGNESIUM SULFATE 2 GM/50ML IV SOLN
2.0000 g | Freq: Once | INTRAVENOUS | Status: AC
  Administered 2022-01-18: 2 g via INTRAVENOUS
  Filled 2022-01-18: qty 50

## 2022-01-18 MED ORDER — SODIUM CHLORIDE 0.9% FLUSH
10.0000 mL | Freq: Once | INTRAVENOUS | Status: AC
  Administered 2022-01-18: 10 mL

## 2022-01-18 MED ORDER — SODIUM CHLORIDE 0.9 % IV SOLN
30.0000 mg/m2 | Freq: Once | INTRAVENOUS | Status: AC
  Administered 2022-01-18: 50 mg via INTRAVENOUS
  Filled 2022-01-18: qty 50

## 2022-01-18 NOTE — Patient Instructions (Signed)
[facility] [place] [facility]  Discharge Instructions: ?Thank you for choosing [facility] to provide your oncology and hematology care.  ? ?If you have a lab appointment with the [facility], please go directly to the [facility] and check in at the registration area. ?  ?Wear comfortable clothing and clothing appropriate for easy access to any Portacath or PICC line.  ? ?We strive to give you quality time with your provider. You may need to reschedule your appointment if you arrive late (15 or more minutes).  Arriving late affects you and other patients whose appointments are after yours.  Also, if you miss three or more appointments without notifying the office, you may be dismissed from the clinic at the provider?s discretion.    ?  ?For prescription refill requests, have your pharmacy contact our office and allow 72 hours for refills to be completed.   ? ?Today you received the following chemotherapy and/or immunotherapy agent: Cisplatin ?  ?To help prevent nausea and vomiting after your treatment, we encourage you to take your nausea medication as directed. ? ?BELOW ARE SYMPTOMS THAT SHOULD BE REPORTED IMMEDIATELY: ?*FEVER GREATER THAN 100.4 F (38 ?C) OR HIGHER ?*CHILLS OR SWEATING ?*NAUSEA AND VOMITING THAT IS NOT CONTROLLED WITH YOUR NAUSEA MEDICATION ?*UNUSUAL SHORTNESS OF BREATH ?*UNUSUAL BRUISING OR BLEEDING ?*URINARY PROBLEMS (pain or burning when urinating, or frequent urination) ?*BOWEL PROBLEMS (unusual diarrhea, constipation, pain near the anus) ?TENDERNESS IN MOUTH AND THROAT WITH OR WITHOUT PRESENCE OF ULCERS (sore throat, sores in mouth, or a toothache) ?UNUSUAL RASH, SWELLING OR PAIN  ?UNUSUAL VAGINAL DISCHARGE OR ITCHING  ? ?Items with * indicate a potential emergency and should be followed up as soon as possible or go to the Emergency Department if any problems should occur. ? ?Please show the CHEMOTHERAPY ALERT CARD or IMMUNOTHERAPY ALERT CARD at check-in to the  Emergency Department and triage nurse. ? ?Should you have questions after your visit or need to cancel or reschedule your appointment, please contact [facility]  Dept: [phone]  and follow the prompts.  Office hours are 8:00 a.m. to 4:30 p.m. Monday - Friday. Please note that voicemails left after 4:00 p.m. may not be returned until the following business day.  We are closed weekends and major holidays. You have access to a nurse at all times for urgent questions. Please call the main number to the clinic Dept: [phone] and follow the prompts. ? ? ?For any non-urgent questions, you may also contact your provider using MyChart. We now offer e-Visits for anyone 18 and older to request care online for non-urgent symptoms. For details visit [email or web address]. ?  ?Also download the MyChart app! Go to the app store, search "MyChart", open the app, select [facility], and log in with your MyChart username and password. ? ?Due to Covid, a mask is required upon entering the hospital/clinic. If you do not have a mask, one will be given to you upon arrival. For doctor visits, patients may have 1 support person aged 72 or older with them. For treatment visits, patients cannot have anyone with them due to current Covid guidelines and our immunocompromised population.  ? ?

## 2022-01-18 NOTE — Progress Notes (Signed)
Nutrition Follow-up: ? ?Patient with recurrent laryngeal cancer. Single agent keytruda discontinued due to heart metastasis. She is currently receiving cisplatin q7 days (2 weeks on 1 week off to allow for blood count recovery) ?  ?Met with patient during infusion. She reports ongoing fatigue lasting 2 days after treatment. Patient denies nausea, vomiting, diarrhea, constipation. She has been increasing oral intake of supplements instead of using feeding tube. Patient recalls drinking 4 cartons [employer] 1.5 and 24 ounces of water. She is giving one carton [employer] and 16 ounces of water via tube.  ? ? ?Medications: reviewed  ? ?Labs: glucose 100, BUN 35 ? ?Anthropometrics: Weight 139 lb today stable  ? ?4/14 - 139 lb 9.6 oz  ?3/31 - 140 lb 8 oz ? ?NUTRITION DIAGNOSIS: Food and nutrition related knowledge deficit improved  ? ? ?INTERVENTION:  ?Continue [employer] 1.5 daily ?  ? ?MONITORING, EVALUATION, GOAL: weight trends, intake  ? ? ?NEXT VISIT: To be scheduled  ? ? ? ?

## 2022-01-21 DIAGNOSIS — C321 Malignant neoplasm of supraglottis: Secondary | ICD-10-CM | POA: Diagnosis not present

## 2022-01-21 DIAGNOSIS — R633 Feeding difficulties, unspecified: Secondary | ICD-10-CM | POA: Diagnosis not present

## 2022-01-22 ENCOUNTER — Other Ambulatory Visit (HOSPITAL_COMMUNITY): Payer: Self-pay

## 2022-01-24 ENCOUNTER — Telehealth (HOSPITAL_COMMUNITY): Payer: Self-pay | Admitting: Emergency Medicine

## 2022-01-24 NOTE — Telephone Encounter (Signed)
Unable to leave vm Jazyah Butsch RN Navigator Cardiac Imaging [facility] [phone] Office  [phone] Cell  

## 2022-01-25 ENCOUNTER — Ambulatory Visit (HOSPITAL_COMMUNITY)
Admission: RE | Admit: 2022-01-25 | Discharge: 2022-01-25 | Disposition: A | Discharge status: Home / Self Care | Payer: Medicare HMO | Source: Ambulatory Visit | Attending: Hematology and Oncology | Admitting: Hematology and Oncology

## 2022-01-25 DIAGNOSIS — C329 Malignant neoplasm of larynx, unspecified: Secondary | ICD-10-CM

## 2022-01-25 IMAGING — MR MR CARD MORPHOLOGY WO/W CM
45 of 48 series · 45 of 48 positions shown · IV contrast (Contrast agent)
Comparison: none

CLINICAL DATA: Clinical question of RV mass
Study assumes HCT of 31 and BSA of 1.66 m2.

EXAM:
CARDIAC MRI
TECHNIQUE: The patient was scanned on a 1.5 Tesla GE magnet. A dedicated
cardiac coil was used. Functional imaging was done using [employer]
sequences. [DATE], and 4 chamber views were done to assess for RWMA's.
Modified BOOM rule using a short axis stack was used to
calculate an ejection fraction on a dedicated work station using
Circle software. The patient received 7 cc of Gadavist. After 10
minutes inversion recovery sequences were used to assess for
infiltration and scar tissue.
CONTRAST:  7 cc  of Gadavist

[Series 4: t2_haste_db_tra_bh · axial · 8.0mm · 1.41mm/px · 1 of 16 slices shown]
[im 1/16]
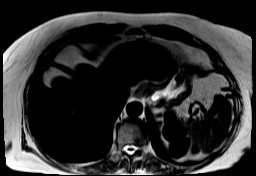

[Series 9: bSSFP · oblique · 8.0mm · 1.61mm/px · 1 of 25 slices shown (1 of 17)]
[im 1/25]
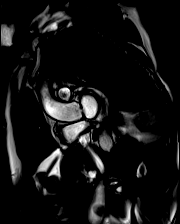

[Series 10: bSSFP · oblique · 8.0mm · 1.61mm/px · 1 of 25 slices shown (2 of 17)]
[im 1/25]
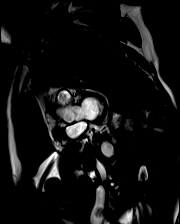

[Series 11: bSSFP · oblique · 8.0mm · 1.61mm/px · 1 of 25 slices shown (3 of 17)]
[im 1/25]
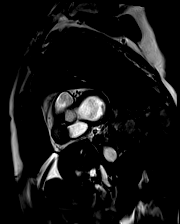

[Series 12: bSSFP · oblique · 8.0mm · 1.61mm/px · 1 of 25 slices shown (4 of 17)]
[im 1/25]
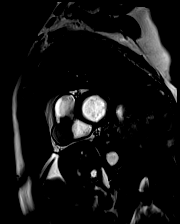

[Series 13: bSSFP · oblique · 8.0mm · 1.61mm/px · 1 of 25 slices shown (5 of 17)]
[im 1/25]
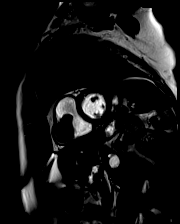

[Series 14: bSSFP · oblique · 8.0mm · 1.61mm/px · 1 of 25 slices shown (6 of 17)]
[im 1/25]
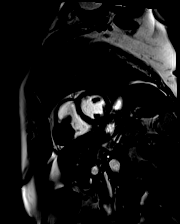

[Series 15: bSSFP · oblique · 8.0mm · 1.61mm/px · 1 of 25 slices shown (7 of 17)]
[im 1/25]
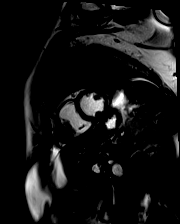

[Series 16: bSSFP · oblique · 8.0mm · 1.61mm/px · 1 of 25 slices shown (8 of 17)]
[im 1/25]
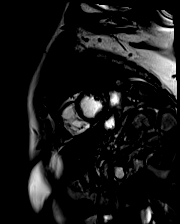

[Series 17: bSSFP · oblique · 8.0mm · 1.61mm/px · 1 of 25 slices shown (9 of 17)]
[im 1/25]
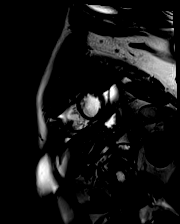

[Series 18: bSSFP · oblique · 8.0mm · 1.61mm/px · 1 of 25 slices shown (10 of 17)]
[im 1/25]
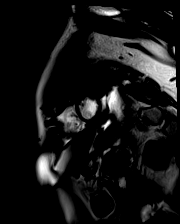

[Series 19: bSSFP · oblique · 8.0mm · 1.61mm/px · 1 of 25 slices shown (11 of 17)]
[im 1/25]
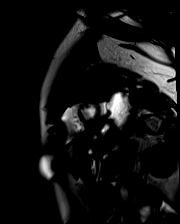

[Series 20: bSSFP · oblique · 8.0mm · 1.61mm/px · 1 of 25 slices shown (12 of 17)]
[im 1/25]
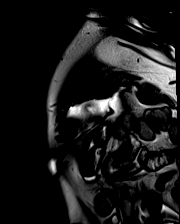

[Series 21: bSSFP · oblique · 8.0mm · 1.61mm/px · 1 of 25 slices shown (13 of 17)]
[im 1/25]
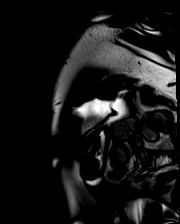

[Series 22: bSSFP · oblique · 8.0mm · 1.61mm/px · 1 of 25 slices shown (14 of 17)]
[im 1/25]
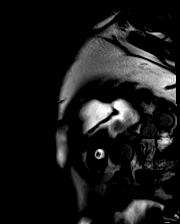

[Series 23: bSSFP · axial · 6.0mm · 1.41mm/px · 1 of 25 slices shown (15 of 17)]
[im 1/25]
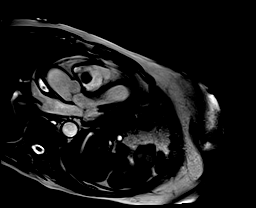

[Series 24: bSSFP · oblique · 6.0mm · 1.41mm/px · 1 of 25 slices shown (16 of 17)]
[im 1/25]
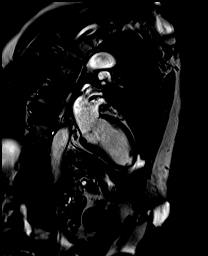

[Series 25: bSSFP · axial · 6.0mm · 1.41mm/px · 1 of 25 slices shown (17 of 17)]
[im 1/25]
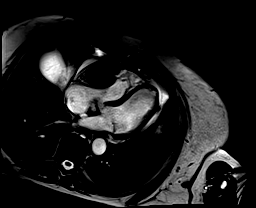

[Series 26: cine_trufi_short axis_cs_2_shot · oblique · 8.0mm · 1.48mm/px · 1 of 25 slices shown (1 of 15)]
[im 1/25]
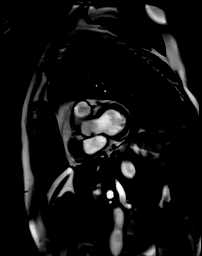

[Series 26: cine_trufi_short axis_cs_2_shot · oblique · 8.0mm · 1.48mm/px · 1 of 25 slices shown (2 of 15)]
[im 1/25]
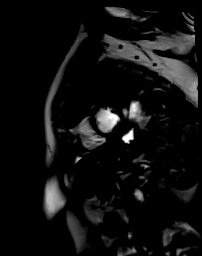

[Series 26: cine_trufi_short axis_cs_2_shot · oblique · 8.0mm · 1.48mm/px · 1 of 25 slices shown (3 of 15)]
[im 1/25]
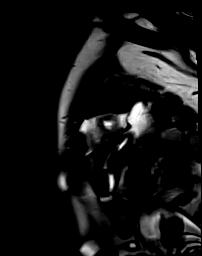

[Series 26: cine_trufi_short axis_cs_2_shot · oblique · 8.0mm · 1.48mm/px · 1 of 25 slices shown (4 of 15)]
[im 1/25]
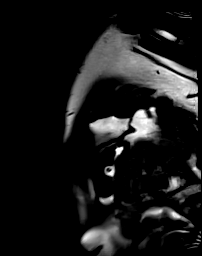

[Series 26: cine_trufi_short axis_cs_2_shot · oblique · 8.0mm · 1.48mm/px · 1 of 25 slices shown (5 of 15)]
[im 1/25]
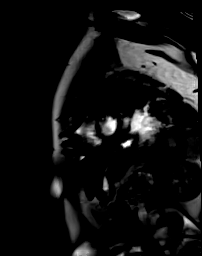

[Series 26: cine_trufi_short axis_cs_2_shot · oblique · 8.0mm · 1.48mm/px · 1 of 25 slices shown (6 of 15)]
[im 1/25]
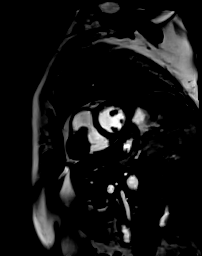

[Series 26: cine_trufi_short axis_cs_2_shot · oblique · 8.0mm · 1.48mm/px · 1 of 25 slices shown (7 of 15)]
[im 1/25]
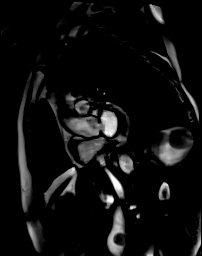

[Series 26: cine_trufi_short axis_cs_2_shot · oblique · 8.0mm · 1.48mm/px · 1 of 25 slices shown (8 of 15)]
[im 1/25]
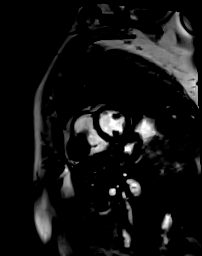

[Series 26: cine_trufi_short axis_cs_2_shot · oblique · 8.0mm · 1.48mm/px · 1 of 25 slices shown (9 of 15)]
[im 1/25]
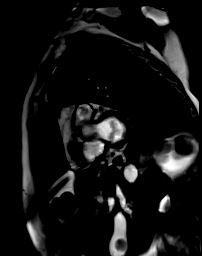

[Series 26: cine_trufi_short axis_cs_2_shot · oblique · 8.0mm · 1.48mm/px · 1 of 25 slices shown (10 of 15)]
[im 1/25]
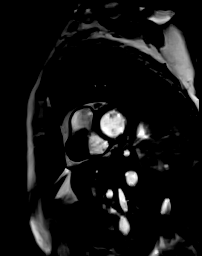

[Series 26: cine_trufi_short axis_cs_2_shot · oblique · 8.0mm · 1.48mm/px · 1 of 25 slices shown (11 of 15)]
[im 1/25]
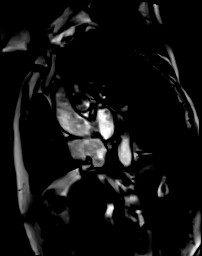

[Series 26: cine_trufi_short axis_cs_2_shot · oblique · 8.0mm · 1.48mm/px · 1 of 25 slices shown (12 of 15)]
[im 1/25]
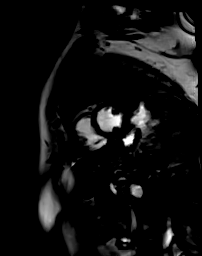

[Series 26: cine_trufi_short axis_cs_2_shot · oblique · 8.0mm · 1.48mm/px · 1 of 25 slices shown (13 of 15)]
[im 1/25]
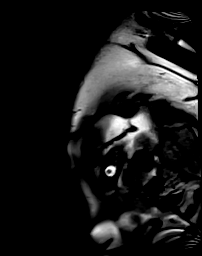

[Series 26: cine_trufi_short axis_cs_2_shot · oblique · 8.0mm · 1.48mm/px · 1 of 25 slices shown (14 of 15)]
[im 1/25]
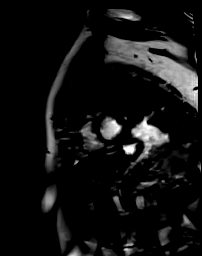

[Series 26: cine_trufi_short axis_cs_2_shot · oblique · 8.0mm · 1.48mm/px · 1 of 25 slices shown (15 of 15)]
[im 1/25]
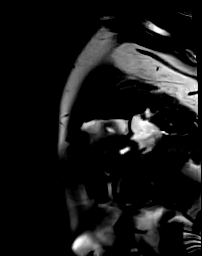

[Series 27: (id)_long_t1 · oblique · 8.0mm · 1.56mm/px · 1 of 24 slices shown]
[im 1/24]
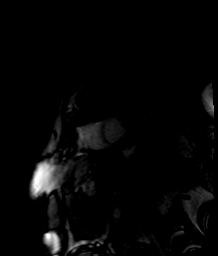

[Series 28: (id)_long_t1_moco · oblique · 8.0mm · 1.56mm/px · 1 of 24 slices shown]
[im 1/24]
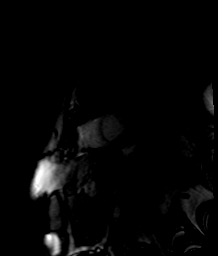

[Series 29: (id)_long_t1_moco_t1 · oblique · 8.0mm · 1.56mm/px · 1 of 6 slices shown]
[im 1/6]
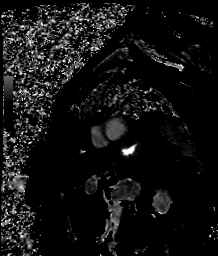

[Series 31: (id)_trufi · oblique · 8.0mm · 2.08mm/px · 1 of 9 slices shown]
[im 1/9]
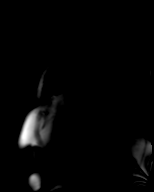

[Series 32: (id)_trufi_moco · oblique · 8.0mm · 2.08mm/px · 1 of 9 slices shown]
[im 1/9]
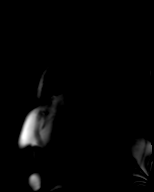

[Series 33: (id)_trufi_moco_t2 · oblique · 8.0mm · 2.08mm/px · 1 of 3 slices shown]
[im 1/3]
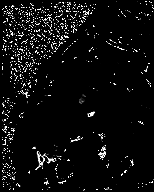

[Series 41: t2_tse_fs db short · axial · 8.0mm · 1.71mm/px · 1 of 1 slices shown]
[im 1/1]
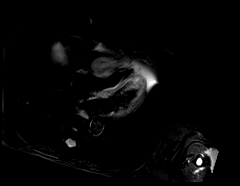

[Series 42: t2_tse_ db short · axial · 8.0mm · 1.71mm/px · 1 of 1 slices shown]
[im 1/1]
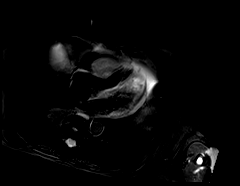

[Series 43: t1_tse_db short axis · axial · 8.0mm · 1.51mm/px · 1 of 1 slices shown]
[im 1/1]
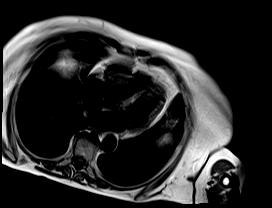

[Series 44: t1_tse_fs db short · axial · 8.0mm · 1.51mm/px · 1 of 1 slices shown]
[im 1/1]
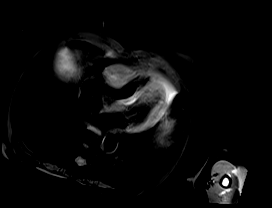

[Series 45: pre short axis · oblique · non-contrast · 8.0mm · 2.25mm/px · 1 of 10 slices shown (1 of 2)]
[im 1/10]
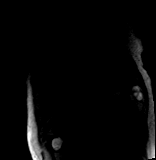

[Series 46: pre short axis · oblique · non-contrast · 8.0mm · 2.25mm/px · 1 of 10 slices shown (2 of 2)]
[im 1/10]
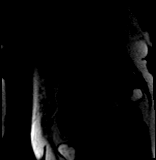

[45 of 48 positions shown; findings below may reference images not displayed]

FINDINGS: 1. Normal left ventricular size, with LVEDD 43 mm, and LVEDVi 58
mL/m2.

Normal left ventricular thickness, with intraventricular septal
thickness of 8 mm, posterior wall thickness of 6 mm, and myocardial
mass index of 29 g/m2.

Normal left ventricular systolic function (LVEF =57%). There are no
regional wall motion abnormalities.

Left ventricular parametric mapping notable for normal T2 signal.
Slight increase ECV signal in apical inferior (34%).

There is no late gadolinium enhancement in the left ventricular
myocardium.

2. Normal right ventricular size with RVEDVI 56 mL/m2.

Normal right ventricular thickness.

Normal right ventricular systolic function (RVEF =56%). There are no
regional wall motion abnormalities or aneurysms.

There is a large RV mass measuring 50 mm x 46 mm x 42 mm.

Mass is isointense and heterogeneous on T1 weighted imaging, does
not suppress with fat saturation.

Isointense and heterogeneous on T2 weighted imaging.

No first pass perfusion assessment.

Heterogenous enhancement on LGE imaging.

No long T1 assessment, but no evidence of associated thrombus.

3.  Normal left and right atrial size.

4. Normal size of the aortic root, ascending aorta and pulmonary
artery.

5. Valve assessment:

Mitral valve: No significant regurgitation

Aortic valve: Qualitatively, there is no significant regurgitation.
Valve morphology not well visualized in this study.

Tricuspid valve: Qualitatively, there is no significant
regurgitation.

Pulmonic valve: Qualitatively, there is no significant
regurgitation.

6.  Normal pericardium.  No pericardial effusion.

7. Grossly, no extracardiac findings. Recommended dedicated study if
concerned for non-cardiac pathology.

8. Breath hold artifacts noted. This decreased the sensitivity of
volumetric assessments.
IMPRESSION: RV mass concerning for malignancy

Similar in size from [DATE].

Normal RV function in this study.

## 2022-01-25 MED ORDER — GADOBUTROL 1 MMOL/ML IV SOLN
7.0000 mL | Freq: Once | INTRAVENOUS | Status: AC | PRN
  Administered 2022-01-25: 7 mL via INTRAVENOUS

## 2022-01-30 DIAGNOSIS — C329 Malignant neoplasm of larynx, unspecified: Secondary | ICD-10-CM | POA: Diagnosis not present

## 2022-01-31 MED FILL — Fosaprepitant Dimeglumine For IV Infusion 150 MG (Base Eq): INTRAVENOUS | Qty: 5 | Status: AC

## 2022-01-31 MED FILL — Dexamethasone Sodium Phosphate Inj 100 MG/10ML: INTRAMUSCULAR | Qty: 1 | Status: AC

## 2022-02-01 ENCOUNTER — Other Ambulatory Visit: Payer: Self-pay

## 2022-02-01 ENCOUNTER — Inpatient Hospital Stay: Payer: Medicare HMO

## 2022-02-01 ENCOUNTER — Encounter: Payer: Self-pay | Admitting: Hematology and Oncology

## 2022-02-01 ENCOUNTER — Inpatient Hospital Stay: Payer: Medicare HMO | Attending: Hematology | Admitting: Hematology and Oncology

## 2022-02-01 VITALS — BP 87/50 | HR 80 | Temp 97.2°F | Resp 18 | Ht 62.0 in | Wt 140.0 lb

## 2022-02-01 DIAGNOSIS — Z930 Tracheostomy status: Secondary | ICD-10-CM | POA: Insufficient documentation

## 2022-02-01 DIAGNOSIS — E46 Unspecified protein-calorie malnutrition: Secondary | ICD-10-CM | POA: Insufficient documentation

## 2022-02-01 DIAGNOSIS — C321 Malignant neoplasm of supraglottis: Secondary | ICD-10-CM | POA: Insufficient documentation

## 2022-02-01 DIAGNOSIS — I513 Intracardiac thrombosis, not elsewhere classified: Secondary | ICD-10-CM | POA: Insufficient documentation

## 2022-02-01 DIAGNOSIS — E559 Vitamin D deficiency, unspecified: Secondary | ICD-10-CM | POA: Diagnosis not present

## 2022-02-01 DIAGNOSIS — C329 Malignant neoplasm of larynx, unspecified: Secondary | ICD-10-CM

## 2022-02-01 DIAGNOSIS — G629 Polyneuropathy, unspecified: Secondary | ICD-10-CM | POA: Insufficient documentation

## 2022-02-01 DIAGNOSIS — D61818 Other pancytopenia: Secondary | ICD-10-CM | POA: Diagnosis not present

## 2022-02-01 DIAGNOSIS — T451X5D Adverse effect of antineoplastic and immunosuppressive drugs, subsequent encounter: Secondary | ICD-10-CM | POA: Diagnosis not present

## 2022-02-01 DIAGNOSIS — C7989 Secondary malignant neoplasm of other specified sites: Secondary | ICD-10-CM | POA: Insufficient documentation

## 2022-02-01 DIAGNOSIS — Z7901 Long term (current) use of anticoagulants: Secondary | ICD-10-CM | POA: Diagnosis not present

## 2022-02-01 DIAGNOSIS — D61810 Antineoplastic chemotherapy induced pancytopenia: Secondary | ICD-10-CM | POA: Insufficient documentation

## 2022-02-01 DIAGNOSIS — G893 Neoplasm related pain (acute) (chronic): Secondary | ICD-10-CM | POA: Insufficient documentation

## 2022-02-01 DIAGNOSIS — R633 Feeding difficulties, unspecified: Secondary | ICD-10-CM | POA: Diagnosis not present

## 2022-02-01 DIAGNOSIS — Z95828 Presence of other vascular implants and grafts: Secondary | ICD-10-CM

## 2022-02-01 LAB — CBC WITH DIFFERENTIAL (CANCER CENTER ONLY)
Abs Immature Granulocytes: 0 10*3/uL (ref 0.00–0.07)
Basophils Absolute: 0 10*3/uL (ref 0.0–0.1)
Basophils Relative: 0 %
Eosinophils Absolute: 0.1 10*3/uL (ref 0.0–0.5)
Eosinophils Relative: 3 %
HCT: 30.3 % — ABNORMAL LOW (ref 36.0–46.0)
Hemoglobin: 10.3 g/dL — ABNORMAL LOW (ref 12.0–15.0)
Immature Granulocytes: 0 %
Lymphocytes Relative: 33 %
Lymphs Abs: 0.9 10*3/uL (ref 0.7–4.0)
MCH: 31.9 pg (ref 26.0–34.0)
MCHC: 34 g/dL (ref 30.0–36.0)
MCV: 93.8 fL (ref 80.0–100.0)
Monocytes Absolute: 0.3 10*3/uL (ref 0.1–1.0)
Monocytes Relative: 11 %
Neutro Abs: 1.4 10*3/uL — ABNORMAL LOW (ref 1.7–7.7)
Neutrophils Relative %: 53 %
Platelet Count: 134 10*3/uL — ABNORMAL LOW (ref 150–400)
RBC: 3.23 MIL/uL — ABNORMAL LOW (ref 3.87–5.11)
RDW: 13.2 % (ref 11.5–15.5)
WBC Count: 2.7 10*3/uL — ABNORMAL LOW (ref 4.0–10.5)
nRBC: 0 % (ref 0.0–0.2)

## 2022-02-01 LAB — BASIC METABOLIC PANEL - CANCER CENTER ONLY
Anion gap: 7 (ref 5–15)
BUN: 38 mg/dL — ABNORMAL HIGH (ref 8–23)
CO2: 26 mmol/L (ref 22–32)
Calcium: 8.4 mg/dL — ABNORMAL LOW (ref 8.9–10.3)
Chloride: 105 mmol/L (ref 98–111)
Creatinine: 0.96 mg/dL (ref 0.44–1.00)
GFR, Estimated: >60 mL/min (ref >60)
Glucose, Bld: 110 mg/dL — ABNORMAL HIGH (ref 70–99)
Potassium: 4.4 mmol/L (ref 3.5–5.1)
Sodium: 138 mmol/L (ref 135–145)

## 2022-02-01 LAB — MAGNESIUM: Magnesium: 2.4 mg/dL (ref 1.7–2.4)

## 2022-02-01 MED ORDER — SODIUM CHLORIDE 0.9% FLUSH
10.0000 mL | Freq: Once | INTRAVENOUS | Status: AC
  Administered 2022-02-01: 10 mL

## 2022-02-01 NOTE — Assessment & Plan Note (Signed)
She has stable pain control ?She will continue the same medications for pain ?

## 2022-02-01 NOTE — Progress Notes (Signed)
[facility] ?OFFICE PROGRESS NOTE ? ?Patient Care Team: ?Hamrick, Lorin Mercy, MD as PCP - General (Family Medicine) ?Heath Lark, MD as Consulting Physician (Hematology and Oncology) ? ?ASSESSMENT & PLAN:  ?Laryngeal squamous cell carcinoma (HCC) ?I have reviewed findings from recent MRI ?Overall, despite being on chemotherapy for many months, there has been only disease stability with no appreciable improvement of the ventricular mass ?The chemotherapy is causing profound pancytopenia, low blood pressure and others ?We have extensive discussions about plan of care ?I recommend consideration for chemotherapy holiday with close observation and repeat imaging study in 3 months versus continuing chemotherapy ?Ultimately, she is in agreement to take a treatment break ?I plan to see her again in 6 weeks for further follow-up and supportive care and plan to repeat CT imaging of the chest in August ? ?Pancytopenia, acquired ([place]) ?This is due to her treatment ?I have checked serum vitamin B12 over a year ago and that was within normal range ?As above, we will take a treatment break ?She agree for observation ? ?Cancer associated pain ?She has stable pain control ?She will continue the same medications for pain ? ?Protein malnutrition ([place]) ?She is dependent on feeding tube ?I suspect there is an element of dehydration causing hypotension ?I recommend the patient to increase oral fluid hydration as tolerated ? ?Orders Placed This Encounter  ?Procedures  ? Comprehensive metabolic panel  ?  Standing Status:   Standing  ?  Number of Occurrences:   53  ?  Standing Expiration Date:   02/02/2023  ? CBC with Differential/Platelet  ?  Standing Status:   Standing  ?  Number of Occurrences:   64  ?  Standing Expiration Date:   02/02/2023  ? ? ?All questions were answered. The patient knows to call the clinic with any problems, questions or concerns. ?The total time spent in the appointment was 30 minutes encounter with  patients including review of chart and various tests results, discussions about plan of care and coordination of care plan ?  ?Heath Lark, MD ?02/01/2022 12:15 PM ? ?INTERVAL HISTORY: ?Please see below for problem oriented charting. ?she returns for treatment follow-up with her son ?I communicated with the patient through writing ?She is doing well ?Denies worsening peripheral neuropathy ?She still have neuropathy in her hands ?No hearing deficit ?Denies significant pain or mucositis ?The patient denies any recent signs or symptoms of bleeding such as spontaneous epistaxis, hematuria or hematochezia. ? ?REVIEW OF SYSTEMS:   ?Constitutional: Denies fevers, chills or abnormal weight loss ?Eyes: Denies blurriness of vision ?Ears, nose, mouth, throat, and face: Denies mucositis or sore throat ?Respiratory: Denies cough, dyspnea or wheezes ?Cardiovascular: Denies palpitation, chest discomfort or lower extremity swelling ?Gastrointestinal:  Denies nausea, heartburn or change in bowel habits ?Skin: Denies abnormal skin rashes ?Lymphatics: Denies new lymphadenopathy or easy bruising ?Neurological:Denies numbness, tingling or new weaknesses ?Behavioral/Psych: Mood is stable, no new changes  ?All other systems were reviewed with the patient and are negative. ? ?I have reviewed the past medical history, past surgical history, social history and family history with the patient and they are unchanged from previous note. ? ?ALLERGIES:  has no active allergies. ? ?MEDICATIONS:  ?Current Outpatient Medications  ?Medication Sig Dispense Refill  ? apixaban (ELIQUIS) 2.5 MG TABS tablet Take 1 tablet (2.5 mg total) by mouth 2 (two) times daily. 60 tablet 3  ? Cholecalciferol (VITAMIN D3) 125 MCG (5000 UT) CAPS Take 5,000 Units by mouth daily.    ?  HYDROcodone-acetaminophen (NORCO/VICODIN) 5-325 MG tablet TAKE 1 TABLET BY MOUTH EVERY 6 HOURS AS NEEDED FOR MODERATE PAIN 60 tablet 0  ? levothyroxine (SYNTHROID) 137 MCG tablet Take 137 mcg  by mouth daily before breakfast.    ? lidocaine-prilocaine (EMLA) cream Apply topically daily as needed. 30 g 9  ? Nutritional Supplements (KATE FARMS PEPTIDE 1.5) LIQD Take 4 Bottles by mouth daily. 325 mL 11  ? ondansetron (ZOFRAN) 8 MG tablet Take 1 tablet (8 mg total) by mouth every 8 (eight) hours as needed for nausea. 30 tablet 3  ? oxymetazoline (AFRIN) 0.05 % nasal spray Place 3 sprays into both nostrils 2 (two) times daily as needed (Bleeding from stoma). 30 mL 0  ? prochlorperazine (COMPAZINE) 10 MG tablet Take 1 tablet (10 mg total) by mouth every 6 (six) hours as needed for nausea or vomiting. 30 tablet 1  ? ?No current facility-administered medications for this visit.  ? ? ?SUMMARY OF ONCOLOGIC HISTORY: ?Oncology History Overview Note  ?Progressed on pembrolizumab ?  ?Laryngeal squamous cell carcinoma ([place])  ?12/02/2018 Imaging  ? CT neck: ?IMPRESSION: ?1. Supraglottic laryngeal carcinoma extending across the anterior commissure and invading the prelaryngeal fat, left aryepiglottic ?fold and left aspect of the epiglottis. ?2. Severe narrowing of the laryngeal airway. ?3. Bilateral subcentimeter level 2A and 2A cervical lymph nodes. ?  ?  ?12/02/2018 Imaging  ? CT  Chest: ? ?IMPRESSION: ?No evidence of metastatic disease in the chest. ? ?  ?12/03/2018 Pathology Results  ? (Care Everywhere; [facility]) ?A: Larynx, supraglottis, biopsy ?- Atypical squamous proliferation with at least low grade dysplasia and abundant keratinization ?- No definite high grade dysplasia or invasive carcinoma represented (see comment) ? ?  ?12/18/2018 Pathology Results  ? (Care Everywhere; [facility]) ?A:  Neck, left, fine needle aspiration ?- Rare single atypical cells, no cells diagnostic of malignancy ?- Lymph node material present ? ?B:  Neck, right, fine needle aspiration ?- Non-diagnostic specimen ?- No lymph node material identified ?- Mixed inflammation and blood ? ?  ?12/31/2018 Pathology Results  ? (Care Everywhere; [facility]) ? ?A: Larynx, left supraglottic mass, biopsy ?- Atypical squamous proliferation with abundant keratinization, suspicious for well-differentiated invasive squamous cell carcinoma ? ?B: Larynx, left supraglottic mass, biopsy ?- Well-differentiated invasive squamous cell carcinoma with abundant keratinization (see comment) ? ?  ?01/18/2019 Initial Diagnosis  ? Laryngeal cancer ([place]) ? ?  ?01/26/2019 Imaging  ? 1. Locally advanced hypermetabolic laryngeal neoplasm centered in the left supraglottic space with bilateral glottic and epiglottic involvement and subglottic extension on the left. ?2. Hypermetabolic bilateral level 3 neck nodal metastases. ?3. Hypermetabolic high mediastinal nodal metastases between the trachea and upper thoracic esophagus. ?4. Otherwise no distant hypermetabolic metastatic disease. ?5. Nodular 7 mm focus at the left lung base, below PET resolution, recommend attention on follow-up chest CT in 3 months. ?6. Chronic findings include: Aortic Atherosclerosis (ICD10-I70.0). Cholelithiasis. ?  ?02/08/2019 Cancer Staging  ? Staging form: Larynx - Supraglottis, AJCC 8th Edition ?- Clinical stage from 02/08/2019: Stage IVA (cT3, cN2c, cM0) - Signed by Eppie Gibson, MD on 02/08/2019 ? ?  ?02/11/2019 - 04/07/2019 Chemotherapy  ? The patient weekly cisplatin for chemotherapy treatment.  ? ?  ?07/20/2019 Pathology Results  ? PROCEDURE: Total glossectomy, total pharyngectomy, total ?laryngectomy, total thyroidectomy, and cervical esophagectomy ?TUMOR SITE: Larynx ?TUMOR LATERALITY: Not specified ?TUMOR FOCALITY: Unifocal ?TUMOR SIZE: ?   GREATEST DIMENSION: 6.9 cm ?   ADDITIONAL DIMENSIONS: 5.4 x 2.4 cm ?HISTOLOGIC TYPE: Squamous  cell carcinoma, conventional ?(keratinizing) ?HISTOLOGIC GRADE:  G1-G2: Well to moderately differentiated ?TUMOR EXTENSION: Tumor involves the posterior tongue, pharynx, ?larynx, tracheal cartilage, hyoid bone, cervical esophagus, vagus ?nerve, carotid artery, deep cervical  fascia, ans surrounding ?skeletal muscle and soft tissue ?MARGINS: Uninvolved by invasive tumor; Perineural invasion is ?less than 54mm from the left soft tissue margin ?LYMPHOVASCULAR INVASION: Not identified ?P

## 2022-02-01 NOTE — Assessment & Plan Note (Signed)
She is dependent on feeding tube ?I suspect there is an element of dehydration causing hypotension ?I recommend the patient to increase oral fluid hydration as tolerated ?

## 2022-02-01 NOTE — Assessment & Plan Note (Signed)
I have reviewed findings from recent MRI ?Overall, despite being on chemotherapy for many months, there has been only disease stability with no appreciable improvement of the ventricular mass ?The chemotherapy is causing profound pancytopenia, low blood pressure and others ?We have extensive discussions about plan of care ?I recommend consideration for chemotherapy holiday with close observation and repeat imaging study in 3 months versus continuing chemotherapy ?Ultimately, she is in agreement to take a treatment break ?I plan to see her again in 6 weeks for further follow-up and supportive care and plan to repeat CT imaging of the chest in August ?

## 2022-02-01 NOTE — Assessment & Plan Note (Signed)
This is due to her treatment ?I have checked serum vitamin B12 over a year ago and that was within normal range ?As above, we will take a treatment break ?She agree for observation ?

## 2022-02-19 ENCOUNTER — Other Ambulatory Visit (HOSPITAL_COMMUNITY): Payer: Self-pay

## 2022-02-19 ENCOUNTER — Other Ambulatory Visit: Payer: Self-pay | Admitting: Hematology and Oncology

## 2022-02-19 DIAGNOSIS — C321 Malignant neoplasm of supraglottis: Secondary | ICD-10-CM

## 2022-02-19 MED ORDER — HYDROCODONE-ACETAMINOPHEN 5-325 MG PO TABS
1.0000 | ORAL_TABLET | Freq: Four times a day (QID) | ORAL | 0 refills | Status: DC | PRN
  Filled 2022-02-19: qty 60, 15d supply, fill #0

## 2022-02-27 DIAGNOSIS — C321 Malignant neoplasm of supraglottis: Secondary | ICD-10-CM | POA: Diagnosis not present

## 2022-02-27 DIAGNOSIS — R633 Feeding difficulties, unspecified: Secondary | ICD-10-CM | POA: Diagnosis not present

## 2022-03-07 DIAGNOSIS — E785 Hyperlipidemia, unspecified: Secondary | ICD-10-CM | POA: Diagnosis not present

## 2022-03-07 DIAGNOSIS — Z9181 History of falling: Secondary | ICD-10-CM | POA: Diagnosis not present

## 2022-03-07 DIAGNOSIS — R633 Feeding difficulties, unspecified: Secondary | ICD-10-CM | POA: Diagnosis not present

## 2022-03-07 DIAGNOSIS — Z1331 Encounter for screening for depression: Secondary | ICD-10-CM | POA: Diagnosis not present

## 2022-03-07 DIAGNOSIS — Z139 Encounter for screening, unspecified: Secondary | ICD-10-CM | POA: Diagnosis not present

## 2022-03-07 DIAGNOSIS — C321 Malignant neoplasm of supraglottis: Secondary | ICD-10-CM | POA: Diagnosis not present

## 2022-03-07 DIAGNOSIS — Z0000 Encounter for general adult medical examination without abnormal findings: Secondary | ICD-10-CM | POA: Diagnosis not present

## 2022-03-12 DIAGNOSIS — R633 Feeding difficulties, unspecified: Secondary | ICD-10-CM | POA: Diagnosis not present

## 2022-03-12 DIAGNOSIS — C321 Malignant neoplasm of supraglottis: Secondary | ICD-10-CM | POA: Diagnosis not present

## 2022-03-15 ENCOUNTER — Other Ambulatory Visit: Payer: Self-pay

## 2022-03-15 ENCOUNTER — Other Ambulatory Visit (HOSPITAL_COMMUNITY): Payer: Self-pay

## 2022-03-15 ENCOUNTER — Encounter: Payer: Self-pay | Admitting: Hematology and Oncology

## 2022-03-15 ENCOUNTER — Other Ambulatory Visit: Payer: Self-pay | Admitting: *Deleted

## 2022-03-15 ENCOUNTER — Inpatient Hospital Stay: Payer: Medicare HMO | Admitting: Hematology and Oncology

## 2022-03-15 ENCOUNTER — Inpatient Hospital Stay: Payer: Medicare HMO | Attending: Hematology

## 2022-03-15 VITALS — BP 124/78 | HR 80 | Temp 98.2°F | Resp 18 | Ht 62.0 in | Wt 142.2 lb

## 2022-03-15 DIAGNOSIS — T451X5D Adverse effect of antineoplastic and immunosuppressive drugs, subsequent encounter: Secondary | ICD-10-CM | POA: Insufficient documentation

## 2022-03-15 DIAGNOSIS — C329 Malignant neoplasm of larynx, unspecified: Secondary | ICD-10-CM | POA: Diagnosis not present

## 2022-03-15 DIAGNOSIS — C321 Malignant neoplasm of supraglottis: Secondary | ICD-10-CM

## 2022-03-15 DIAGNOSIS — D61818 Other pancytopenia: Secondary | ICD-10-CM | POA: Diagnosis not present

## 2022-03-15 DIAGNOSIS — I513 Intracardiac thrombosis, not elsewhere classified: Secondary | ICD-10-CM | POA: Diagnosis not present

## 2022-03-15 DIAGNOSIS — Z95828 Presence of other vascular implants and grafts: Secondary | ICD-10-CM

## 2022-03-15 DIAGNOSIS — E559 Vitamin D deficiency, unspecified: Secondary | ICD-10-CM | POA: Diagnosis not present

## 2022-03-15 DIAGNOSIS — D61810 Antineoplastic chemotherapy induced pancytopenia: Secondary | ICD-10-CM | POA: Diagnosis not present

## 2022-03-15 DIAGNOSIS — Z7901 Long term (current) use of anticoagulants: Secondary | ICD-10-CM | POA: Diagnosis not present

## 2022-03-15 DIAGNOSIS — C7989 Secondary malignant neoplasm of other specified sites: Secondary | ICD-10-CM | POA: Diagnosis not present

## 2022-03-15 DIAGNOSIS — E46 Unspecified protein-calorie malnutrition: Secondary | ICD-10-CM

## 2022-03-15 DIAGNOSIS — Z930 Tracheostomy status: Secondary | ICD-10-CM | POA: Insufficient documentation

## 2022-03-15 DIAGNOSIS — Z931 Gastrostomy status: Secondary | ICD-10-CM

## 2022-03-15 DIAGNOSIS — G893 Neoplasm related pain (acute) (chronic): Secondary | ICD-10-CM

## 2022-03-15 DIAGNOSIS — C7800 Secondary malignant neoplasm of unspecified lung: Secondary | ICD-10-CM

## 2022-03-15 DIAGNOSIS — E039 Hypothyroidism, unspecified: Secondary | ICD-10-CM

## 2022-03-15 LAB — COMPREHENSIVE METABOLIC PANEL
ALT: 27 U/L (ref 0–44)
AST: 30 U/L (ref 15–41)
Albumin: 4.2 g/dL (ref 3.5–5.0)
Alkaline Phosphatase: 62 U/L (ref 38–126)
Anion gap: 7 (ref 5–15)
BUN: 41 mg/dL — ABNORMAL HIGH (ref 8–23)
CO2: 27 mmol/L (ref 22–32)
Calcium: 9 mg/dL (ref 8.9–10.3)
Chloride: 104 mmol/L (ref 98–111)
Creatinine, Ser: 0.93 mg/dL (ref 0.44–1.00)
GFR, Estimated: >60 mL/min (ref >60)
Glucose, Bld: 99 mg/dL (ref 70–99)
Potassium: 4.6 mmol/L (ref 3.5–5.1)
Sodium: 138 mmol/L (ref 135–145)
Total Bilirubin: 0.3 mg/dL (ref 0.3–1.2)
Total Protein: 7.1 g/dL (ref 6.5–8.1)

## 2022-03-15 LAB — TSH: TSH: 2.784 u[IU]/mL (ref 0.350–4.500)

## 2022-03-15 LAB — CBC WITH DIFFERENTIAL/PLATELET
Abs Immature Granulocytes: 0.01 10*3/uL (ref 0.00–0.07)
Basophils Absolute: 0 10*3/uL (ref 0.0–0.1)
Basophils Relative: 0 %
Eosinophils Absolute: 0.2 10*3/uL (ref 0.0–0.5)
Eosinophils Relative: 5 %
HCT: 32.6 % — ABNORMAL LOW (ref 36.0–46.0)
Hemoglobin: 10.9 g/dL — ABNORMAL LOW (ref 12.0–15.0)
Immature Granulocytes: 0 %
Lymphocytes Relative: 30 %
Lymphs Abs: 1.1 10*3/uL (ref 0.7–4.0)
MCH: 30.7 pg (ref 26.0–34.0)
MCHC: 33.4 g/dL (ref 30.0–36.0)
MCV: 91.8 fL (ref 80.0–100.0)
Monocytes Absolute: 0.5 10*3/uL (ref 0.1–1.0)
Monocytes Relative: 13 %
Neutro Abs: 1.9 10*3/uL (ref 1.7–7.7)
Neutrophils Relative %: 52 %
Platelets: 169 10*3/uL (ref 150–400)
RBC: 3.55 MIL/uL — ABNORMAL LOW (ref 3.87–5.11)
RDW: 13.2 % (ref 11.5–15.5)
WBC: 3.7 10*3/uL — ABNORMAL LOW (ref 4.0–10.5)
nRBC: 0 % (ref 0.0–0.2)

## 2022-03-15 MED ORDER — HEPARIN SOD (PORK) LOCK FLUSH 100 UNIT/ML IV SOLN
500.0000 [IU] | Freq: Once | INTRAVENOUS | Status: AC
  Administered 2022-03-15: 500 [IU]

## 2022-03-15 MED ORDER — HYDROCODONE-ACETAMINOPHEN 5-325 MG PO TABS
1.0000 | ORAL_TABLET | Freq: Four times a day (QID) | ORAL | 0 refills | Status: DC | PRN
  Filled 2022-03-15: qty 60, 15d supply, fill #0

## 2022-03-15 MED ORDER — SODIUM CHLORIDE 0.9% FLUSH
10.0000 mL | Freq: Once | INTRAVENOUS | Status: AC
  Administered 2022-03-15: 10 mL

## 2022-03-15 MED ORDER — KATE FARMS PEPTIDE 1.5 PO LIQD: 4.0000 | Freq: Every day | ORAL | 11 refills | Status: DC

## 2022-03-15 NOTE — Assessment & Plan Note (Signed)
This is improving She is not symptomatic Observe only 

## 2022-03-19 ENCOUNTER — Other Ambulatory Visit (HOSPITAL_COMMUNITY): Payer: Self-pay

## 2022-03-21 DIAGNOSIS — E782 Mixed hyperlipidemia: Secondary | ICD-10-CM | POA: Diagnosis not present

## 2022-03-21 DIAGNOSIS — C329 Malignant neoplasm of larynx, unspecified: Secondary | ICD-10-CM | POA: Diagnosis not present

## 2022-03-21 DIAGNOSIS — I700 Atherosclerosis of aorta: Secondary | ICD-10-CM | POA: Diagnosis not present

## 2022-03-21 DIAGNOSIS — E892 Postprocedural hypoparathyroidism: Secondary | ICD-10-CM | POA: Diagnosis not present

## 2022-03-21 DIAGNOSIS — R21 Rash and other nonspecific skin eruption: Secondary | ICD-10-CM | POA: Diagnosis not present

## 2022-03-21 DIAGNOSIS — Z930 Tracheostomy status: Secondary | ICD-10-CM | POA: Diagnosis not present

## 2022-03-21 DIAGNOSIS — C7989 Secondary malignant neoplasm of other specified sites: Secondary | ICD-10-CM | POA: Diagnosis not present

## 2022-03-21 DIAGNOSIS — E039 Hypothyroidism, unspecified: Secondary | ICD-10-CM | POA: Diagnosis not present

## 2022-03-23 DIAGNOSIS — R633 Feeding difficulties, unspecified: Secondary | ICD-10-CM | POA: Diagnosis not present

## 2022-03-23 DIAGNOSIS — C321 Malignant neoplasm of supraglottis: Secondary | ICD-10-CM | POA: Diagnosis not present

## 2022-04-05 DIAGNOSIS — C321 Malignant neoplasm of supraglottis: Secondary | ICD-10-CM | POA: Diagnosis not present

## 2022-04-05 DIAGNOSIS — R633 Feeding difficulties, unspecified: Secondary | ICD-10-CM | POA: Diagnosis not present

## 2022-04-08 ENCOUNTER — Other Ambulatory Visit (HOSPITAL_COMMUNITY): Payer: Self-pay

## 2022-04-08 ENCOUNTER — Other Ambulatory Visit: Payer: Self-pay | Admitting: Hematology and Oncology

## 2022-04-08 DIAGNOSIS — C321 Malignant neoplasm of supraglottis: Secondary | ICD-10-CM

## 2022-04-08 MED ORDER — HYDROCODONE-ACETAMINOPHEN 5-325 MG PO TABS
1.0000 | ORAL_TABLET | Freq: Four times a day (QID) | ORAL | 0 refills | Status: DC | PRN
  Filled 2022-04-08: qty 60, 15d supply, fill #0

## 2022-04-16 ENCOUNTER — Other Ambulatory Visit: Payer: Self-pay | Admitting: Hematology and Oncology

## 2022-04-16 ENCOUNTER — Other Ambulatory Visit (HOSPITAL_COMMUNITY): Payer: Self-pay

## 2022-04-16 MED ORDER — APIXABAN 2.5 MG PO TABS
2.5000 mg | ORAL_TABLET | Freq: Two times a day (BID) | ORAL | 3 refills | Status: DC
  Filled 2022-04-16: qty 60, 30d supply, fill #0
  Filled 2022-05-24: qty 60, 30d supply, fill #1

## 2022-04-30 ENCOUNTER — Other Ambulatory Visit: Payer: Self-pay

## 2022-04-30 ENCOUNTER — Ambulatory Visit (HOSPITAL_COMMUNITY)
Admission: RE | Admit: 2022-04-30 | Discharge: 2022-04-30 | Disposition: A | Discharge status: Home / Self Care | Payer: Medicare HMO | Source: Ambulatory Visit | Attending: Hematology and Oncology | Admitting: Hematology and Oncology

## 2022-04-30 ENCOUNTER — Inpatient Hospital Stay: Payer: Medicare HMO | Attending: Hematology

## 2022-04-30 DIAGNOSIS — C329 Malignant neoplasm of larynx, unspecified: Secondary | ICD-10-CM | POA: Diagnosis not present

## 2022-04-30 DIAGNOSIS — E039 Hypothyroidism, unspecified: Secondary | ICD-10-CM | POA: Insufficient documentation

## 2022-04-30 DIAGNOSIS — G893 Neoplasm related pain (acute) (chronic): Secondary | ICD-10-CM | POA: Insufficient documentation

## 2022-04-30 DIAGNOSIS — C321 Malignant neoplasm of supraglottis: Secondary | ICD-10-CM | POA: Insufficient documentation

## 2022-04-30 DIAGNOSIS — Z930 Tracheostomy status: Secondary | ICD-10-CM | POA: Insufficient documentation

## 2022-04-30 DIAGNOSIS — C7989 Secondary malignant neoplasm of other specified sites: Secondary | ICD-10-CM | POA: Diagnosis not present

## 2022-04-30 DIAGNOSIS — E559 Vitamin D deficiency, unspecified: Secondary | ICD-10-CM | POA: Insufficient documentation

## 2022-04-30 DIAGNOSIS — R918 Other nonspecific abnormal finding of lung field: Secondary | ICD-10-CM | POA: Diagnosis not present

## 2022-04-30 DIAGNOSIS — R911 Solitary pulmonary nodule: Secondary | ICD-10-CM | POA: Diagnosis not present

## 2022-04-30 DIAGNOSIS — Z95828 Presence of other vascular implants and grafts: Secondary | ICD-10-CM | POA: Diagnosis not present

## 2022-04-30 DIAGNOSIS — Z7901 Long term (current) use of anticoagulants: Secondary | ICD-10-CM | POA: Insufficient documentation

## 2022-04-30 DIAGNOSIS — R1310 Dysphagia, unspecified: Secondary | ICD-10-CM | POA: Insufficient documentation

## 2022-04-30 DIAGNOSIS — T451X5D Adverse effect of antineoplastic and immunosuppressive drugs, subsequent encounter: Secondary | ICD-10-CM | POA: Insufficient documentation

## 2022-04-30 DIAGNOSIS — I700 Atherosclerosis of aorta: Secondary | ICD-10-CM | POA: Diagnosis not present

## 2022-04-30 DIAGNOSIS — D61810 Antineoplastic chemotherapy induced pancytopenia: Secondary | ICD-10-CM | POA: Insufficient documentation

## 2022-04-30 DIAGNOSIS — I513 Intracardiac thrombosis, not elsewhere classified: Secondary | ICD-10-CM | POA: Insufficient documentation

## 2022-04-30 DIAGNOSIS — R590 Localized enlarged lymph nodes: Secondary | ICD-10-CM | POA: Diagnosis not present

## 2022-04-30 LAB — CBC WITH DIFFERENTIAL/PLATELET
Abs Immature Granulocytes: 0 10*3/uL (ref 0.00–0.07)
Basophils Absolute: 0 10*3/uL (ref 0.0–0.1)
Basophils Relative: 0 %
Eosinophils Absolute: 0.1 10*3/uL (ref 0.0–0.5)
Eosinophils Relative: 3 %
HCT: 34.7 % — ABNORMAL LOW (ref 36.0–46.0)
Hemoglobin: 11.8 g/dL — ABNORMAL LOW (ref 12.0–15.0)
Immature Granulocytes: 0 %
Lymphocytes Relative: 33 %
Lymphs Abs: 1 10*3/uL (ref 0.7–4.0)
MCH: 30 pg (ref 26.0–34.0)
MCHC: 34 g/dL (ref 30.0–36.0)
MCV: 88.3 fL (ref 80.0–100.0)
Monocytes Absolute: 0.3 10*3/uL (ref 0.1–1.0)
Monocytes Relative: 11 %
Neutro Abs: 1.6 10*3/uL — ABNORMAL LOW (ref 1.7–7.7)
Neutrophils Relative %: 53 %
Platelets: 161 10*3/uL (ref 150–400)
RBC: 3.93 MIL/uL (ref 3.87–5.11)
RDW: 12.5 % (ref 11.5–15.5)
WBC: 3.1 10*3/uL — ABNORMAL LOW (ref 4.0–10.5)
nRBC: 0 % (ref 0.0–0.2)

## 2022-04-30 LAB — COMPREHENSIVE METABOLIC PANEL
ALT: 20 U/L (ref 0–44)
AST: 26 U/L (ref 15–41)
Albumin: 4.4 g/dL (ref 3.5–5.0)
Alkaline Phosphatase: 62 U/L (ref 38–126)
Anion gap: 6 (ref 5–15)
BUN: 36 mg/dL — ABNORMAL HIGH (ref 8–23)
CO2: 28 mmol/L (ref 22–32)
Calcium: 8.7 mg/dL — ABNORMAL LOW (ref 8.9–10.3)
Chloride: 104 mmol/L (ref 98–111)
Creatinine, Ser: 0.86 mg/dL (ref 0.44–1.00)
GFR, Estimated: >60 mL/min (ref >60)
Glucose, Bld: 102 mg/dL — ABNORMAL HIGH (ref 70–99)
Potassium: 4.1 mmol/L (ref 3.5–5.1)
Sodium: 138 mmol/L (ref 135–145)
Total Bilirubin: 0.4 mg/dL (ref 0.3–1.2)
Total Protein: 7.6 g/dL (ref 6.5–8.1)

## 2022-04-30 LAB — TSH: TSH: 2.961 u[IU]/mL (ref 0.350–4.500)

## 2022-04-30 MED ORDER — SODIUM CHLORIDE 0.9% FLUSH
10.0000 mL | Freq: Once | INTRAVENOUS | Status: AC
  Administered 2022-04-30: 10 mL

## 2022-04-30 MED ORDER — IOHEXOL 300 MG/ML  SOLN
100.0000 mL | Freq: Once | INTRAMUSCULAR | Status: AC | PRN
  Administered 2022-04-30: 100 mL via INTRAVENOUS

## 2022-04-30 MED ORDER — SODIUM CHLORIDE (PF) 0.9 % IJ SOLN
INTRAMUSCULAR | Status: AC
  Filled 2022-04-30: qty 50

## 2022-04-30 MED ORDER — HEPARIN SOD (PORK) LOCK FLUSH 100 UNIT/ML IV SOLN
INTRAVENOUS | Status: AC
  Filled 2022-04-30: qty 5

## 2022-04-30 NOTE — Patient Instructions (Signed)
[employer], Adult A central line is a long, thin tube (catheter) that is put into a vein so that it goes to a large vein above your heart. It can be used to: Give you medicine or fluids. Give you food and nutrients. Take blood or give you blood for testing or treatments. Types of central lines There are four main types of central lines: Peripherally inserted central catheter (PICC) line. This type is usually put in the upper arm and goes up the arm to the heart. Tunneled central line. This type is placed in a large vein in the neck, chest, or groin. It is tunneled under the skin and brought out through a second incision. Non-tunneled central line. This type is used for a shorter time than other types, usually for 7 days at the most. It is inserted in the neck, chest, or groin. Implanted port. This type can stay in place longer than other types of central lines. It is normally put in the upper chest but can also be placed in the upper arm or the belly. Surgery is needed to put it in and take it out. The type of central line you get will depend on how long you need it and your medical condition. Tell a doctor about: Any allergies you have. All medicines you are taking. These include vitamins, herbs, eye drops, creams, and over-the-counter medicines. Any problems you or family members have had with anesthetic medicines. Any blood disorders you have. Any surgeries you have had. Any medical conditions you have. Whether you are pregnant or may be pregnant. What are the risks? Generally, central lines are safe. However, problems may occur, including: Infection. A blood clot. Bleeding from the place where the central line was inserted. Getting a hole or crack in the central line. If this happens, the central line will need to be replaced. Central line failure. The catheter moving or coming out of place. What happens before the procedure? Medicines Ask your doctor about changing or  stopping: Your normal medicines. Vitamins, herbs, and supplements. Over-the-counter medicines. Do not take aspirin or ibuprofen unless you are told to. General instructions Follow instructions from your doctor about eating or drinking. For your safety, your doctor may: [place] the area of the procedure. Remove hair at the procedure site. Ask you to wash with a soap that kills germs. Plan to have a responsible adult take you home from the hospital or clinic. If you will be going home right after the procedure, plan to have a responsible adult care for you for the time you are told. This is important. What happens during the procedure? An IV tube will be put into one of your veins. You may be given: A sedative. This medicine helps you relax. Anesthetics. These medicines numb certain areas of your body. Your skin will be cleaned with a germ-killing (antiseptic) solution. You may be covered with clean drapes. Your blood pressure, heart rate, breathing rate, and blood oxygen level will be monitored during the procedure. The central line will be put into the vein and moved through it to the correct spot. The doctor may use X-ray equipment to help guide the central line to the right place. A bandage (dressing) will be placed over the insertion area. The procedure may vary among doctors and hospitals. What can I expect after the procedure? You will be monitored until you leave the hospital or clinic. This includes checking your blood pressure, heart rate, breathing rate, and blood oxygen level. Caps may  be placed on the ends of the central line tubing. If you were given a sedative during your procedure, do not drive or use machines until your doctor says that it is safe. Follow these instructions at home: Caring for the tube  Follow instructions from your doctor about: Flushing the tube. Cleaning the tube and the area around it. Only use germ-free (sterile) supplies to flush. The supplies  should be from your doctor, a pharmacy, or another place that your doctor recommends. Before you flush the tube or clean the area around the tube: Wash your hands with soap and water for at least 20 seconds. If you cannot use soap and water, use hand sanitizer. Clean the central line hub with rubbing alcohol. To do this: Scrub it using a twisting motion and rub for 10 to 15 seconds or for 30 twists. Follow the manufacturer's instructions. Be sure you scrub the top of the hub, not just the sides. Never reuse alcohol pads. Let the hub dry before use. Keep it from touching anything while drying. Caring for your skin Check the skin around the central line every day for signs of infection. Check for: Redness, swelling, or pain. Fluid or blood. Warmth. Pus or a bad smell. Keep the area where the tube was put in clean and dry. Change bandages only as told by your doctor. Keep your bandage dry. If a bandage gets wet, have it changed right away. General instructions Keep the tube clamped, unless it is being used. If you or someone else accidentally pulls on the tube, make sure: The bandage is okay. There is no bleeding. The tube has not been pulled out. Do not use scissors or sharp objects near the tube. Do not take baths, swim, or use a hot tub until your doctor says it is okay. Ask your doctor if you may take showers. You may only be allowed to take sponge baths. Ask your doctor what activities are safe for you. Your doctor may tell you not to lift anything or move your arm too much. Take over-the-counter and prescription medicines only as told by your doctor. Keep all follow-up visits. Storing and throwing away supplies Keep your supplies in a clean, dry location. Throw away any used syringes in a container that is only for sharp items (sharps container). You can buy a sharps container from a pharmacy, or you can make one by using an empty hard plastic bottle with a cover. Place any used  bandages or infusion bags into a plastic bag. Throw that bag in the trash. Contact a doctor if: You have any of these signs of infection where the tube was put in: Redness, swelling, or pain. Fluid or blood. Warmth. Pus or a bad smell. Get help right away if: You have: A fever or chills. Shortness of breath. Pain in your chest. A fast heartbeat. Swelling in your neck, face, chest, or arm. You feel dizzy or you faint. There are red lines coming from where the tube was put in. The area where the tube was put in is bleeding and the bleeding will not stop. Your tube is hard to flush. You do not get a blood return from the tube. The tube gets loose or comes out. The tube has a hole or a tear. The tube leaks. Summary A central line is a long, thin tube (catheter) that is put in your vein. It can be used to give you medicine, food, or fluids. Follow instructions from your doctor about flushing  and cleaning the tube. Keep the area where the tube was put in clean and dry. Ask your doctor what activities are safe for you. This information is not intended to replace advice given to you by your health care provider. Make sure you discuss any questions you have with your health care provider. Document Revised: 05/11/2020 Document Reviewed: 05/11/2020 Elsevier Patient Education  [place].

## 2022-05-02 ENCOUNTER — Ambulatory Visit: Payer: Medicare HMO | Admitting: Hematology and Oncology

## 2022-05-03 ENCOUNTER — Other Ambulatory Visit (HOSPITAL_COMMUNITY): Payer: Self-pay

## 2022-05-03 ENCOUNTER — Encounter: Payer: Self-pay | Admitting: Hematology and Oncology

## 2022-05-03 ENCOUNTER — Inpatient Hospital Stay: Payer: Medicare HMO | Admitting: Hematology and Oncology

## 2022-05-03 ENCOUNTER — Other Ambulatory Visit: Payer: Self-pay

## 2022-05-03 DIAGNOSIS — G893 Neoplasm related pain (acute) (chronic): Secondary | ICD-10-CM | POA: Diagnosis not present

## 2022-05-03 DIAGNOSIS — D61818 Other pancytopenia: Secondary | ICD-10-CM | POA: Diagnosis not present

## 2022-05-03 DIAGNOSIS — C321 Malignant neoplasm of supraglottis: Secondary | ICD-10-CM | POA: Diagnosis not present

## 2022-05-03 DIAGNOSIS — E039 Hypothyroidism, unspecified: Secondary | ICD-10-CM | POA: Diagnosis not present

## 2022-05-03 DIAGNOSIS — R1310 Dysphagia, unspecified: Secondary | ICD-10-CM | POA: Diagnosis not present

## 2022-05-03 DIAGNOSIS — Z7901 Long term (current) use of anticoagulants: Secondary | ICD-10-CM | POA: Diagnosis not present

## 2022-05-03 DIAGNOSIS — D61810 Antineoplastic chemotherapy induced pancytopenia: Secondary | ICD-10-CM | POA: Diagnosis not present

## 2022-05-03 DIAGNOSIS — Z930 Tracheostomy status: Secondary | ICD-10-CM | POA: Diagnosis not present

## 2022-05-03 DIAGNOSIS — C329 Malignant neoplasm of larynx, unspecified: Secondary | ICD-10-CM

## 2022-05-03 DIAGNOSIS — T451X5D Adverse effect of antineoplastic and immunosuppressive drugs, subsequent encounter: Secondary | ICD-10-CM | POA: Diagnosis not present

## 2022-05-03 DIAGNOSIS — E559 Vitamin D deficiency, unspecified: Secondary | ICD-10-CM | POA: Diagnosis not present

## 2022-05-03 DIAGNOSIS — I513 Intracardiac thrombosis, not elsewhere classified: Secondary | ICD-10-CM | POA: Diagnosis not present

## 2022-05-03 MED ORDER — HYDROCODONE-ACETAMINOPHEN 5-325 MG PO TABS
1.0000 | ORAL_TABLET | Freq: Four times a day (QID) | ORAL | 0 refills | Status: DC | PRN
  Filled 2022-05-03: qty 60, 15d supply, fill #0

## 2022-05-03 NOTE — Assessment & Plan Note (Signed)
She has stable pain control She will continue the same medications for pain

## 2022-05-03 NOTE — Assessment & Plan Note (Signed)
I have reviewed imaging study with the patient and her son Overall, she has no evidence of disease growth She is not symptomatic We will continue close monitoring with blood work and flush every 6 to 8 weeks and repeat imaging study again mid November

## 2022-05-03 NOTE — Assessment & Plan Note (Signed)
She is dependent on nutritional supplement through her feeding tube She will continue the same

## 2022-05-03 NOTE — Assessment & Plan Note (Signed)
She has intermittent changes to her thyroid function I will adjust her thyroid medicine as needed

## 2022-05-03 NOTE — Progress Notes (Signed)
[place] OFFICE PROGRESS NOTE  Patient Care Team: Hamrick, Lorin Mercy, MD as PCP - General (Family Medicine) Heath Lark, MD as Consulting Physician (Hematology and Oncology)  ASSESSMENT & PLAN:  Laryngeal squamous cell carcinoma ([name]) I have reviewed imaging study with the patient and her son Overall, she has no evidence of disease growth She is not symptomatic We will continue close monitoring with blood work and flush every 6 to 8 weeks and repeat imaging study again mid November  Cancer associated pain She has stable pain control She will continue the same medications for pain  Dysphagia She is dependent on nutritional supplement through her feeding tube She will continue the same  Hypothyroidism She has intermittent changes to her thyroid function I will adjust her thyroid medicine as needed  Pancytopenia, acquired [facility]) She has mild persistent pancytopenia from prior treatment She denies recent bleeding complications from anticoagulation therapy I will monitor closely  No orders of the defined types were placed in this encounter.   All questions were answered. The patient knows to call the clinic with any problems, questions or concerns. The total time spent in the appointment was 30 minutes encounter with patients including review of chart and various tests results, discussions about plan of care and coordination of care plan   Heath Lark, MD 05/03/2022 10:51 AM  INTERVAL HISTORY: Please see below for problem oriented charting. she returns for surveillance follow-up and review of imaging study The patient is taking treatment break She is not symptomatic Denies recent bleeding complications from her treatment Denies chest pain or shortness of breath Her chronic pain is stable She is doing well with her current nutritional supplement through her feeding tube  REVIEW OF SYSTEMS:   Constitutional: Denies fevers, chills or abnormal weight loss Eyes:  Denies blurriness of vision Ears, nose, mouth, throat, and face: Denies mucositis or sore throat Respiratory: Denies cough, dyspnea or wheezes Cardiovascular: Denies palpitation, chest discomfort or lower extremity swelling Gastrointestinal:  Denies nausea, heartburn or change in bowel habits Skin: Denies abnormal skin rashes Lymphatics: Denies new lymphadenopathy or easy bruising Neurological:Denies numbness, tingling or new weaknesses Behavioral/Psych: Mood is stable, no new changes  All other systems were reviewed with the patient and are negative.  I have reviewed the past medical history, past surgical history, social history and family history with the patient and they are unchanged from previous note.  ALLERGIES:  has no active allergies.  MEDICATIONS:  Current Outpatient Medications  Medication Sig Dispense Refill   apixaban (ELIQUIS) 2.5 MG TABS tablet Take 1 tablet (2.5 mg total) by mouth 2 (two) times daily. 60 tablet 3   Cholecalciferol (VITAMIN D3) 125 MCG (5000 UT) CAPS Take 5,000 Units by mouth daily.     HYDROcodone-acetaminophen (NORCO/VICODIN) 5-325 MG tablet TAKE 1 TABLET BY MOUTH EVERY 6 HOURS AS NEEDED FOR MODERATE PAIN 60 tablet 0   levothyroxine (SYNTHROID) 137 MCG tablet Take 137 mcg by mouth daily before breakfast.     lidocaine-prilocaine (EMLA) cream Apply topically daily as needed. 30 g 9   Nutritional Supplements (KATE [employer]ARMS PEPTIDE 1.5) LIQD Take 4 Bottles by mouth daily. 325 mL 11   ondansetron (ZOFRAN) 8 MG tablet Take 1 tablet (8 mg total) by mouth every 8 (eight) hours as needed for nausea. 30 tablet 3   oxymetazoline (AFRIN) 0.05 % nasal spray Place 3 sprays into both nostrils 2 (two) times daily as needed (Bleeding from stoma). 30 mL 0   prochlorperazine (COMPAZINE) 10 MG  tablet Take 1 tablet (10 mg total) by mouth every 6 (six) hours as needed for nausea or vomiting. 30 tablet 1   No current facility-administered medications for this visit.     SUMMARY OF ONCOLOGIC HISTORY: Oncology History Overview Note  Progressed on pembrolizumab   Laryngeal squamous cell carcinoma ([place])  12/02/2018 Imaging   CT neck: IMPRESSION: 1. Supraglottic laryngeal carcinoma extending across the anterior commissure and invading the prelaryngeal fat, left aryepiglottic fold and left aspect of the epiglottis. 2. Severe narrowing of the laryngeal airway. 3. Bilateral subcentimeter level 2A and 2A cervical lymph nodes.     12/02/2018 Imaging   CT  Chest:  IMPRESSION: No evidence of metastatic disease in the chest.   12/03/2018 Pathology Results   (Care Everywhere; [facility]) A: Larynx, supraglottis, biopsy - Atypical squamous proliferation with at least low grade dysplasia and abundant keratinization - No definite high grade dysplasia or invasive carcinoma represented (see comment)   12/18/2018 Pathology Results   (Care Everywhere; [facility]) A:  Neck, left, fine needle aspiration - Rare single atypical cells, no cells diagnostic of malignancy - Lymph node material present  B:  Neck, right, fine needle aspiration - Non-diagnostic specimen - No lymph node material identified - Mixed inflammation and blood   12/31/2018 Pathology Results   (Care Everywhere; [facility])  A: Larynx, left supraglottic mass, biopsy - Atypical squamous proliferation with abundant keratinization, suspicious for well-differentiated invasive squamous cell carcinoma  B: Larynx, left supraglottic mass, biopsy - Well-differentiated invasive squamous cell carcinoma with abundant keratinization (see comment)   01/18/2019 Initial Diagnosis   Laryngeal cancer ([place])   01/26/2019 Imaging   1. Locally advanced hypermetabolic laryngeal neoplasm centered in the left supraglottic space with bilateral glottic and epiglottic involvement and subglottic extension on the left. 2. Hypermetabolic bilateral level 3 neck nodal metastases. 3. Hypermetabolic high mediastinal nodal  metastases between the trachea and upper thoracic esophagus. 4. Otherwise no distant hypermetabolic metastatic disease. 5. Nodular 7 mm focus at the left lung base, below PET resolution, recommend attention on follow-up chest CT in 3 months. 6. Chronic findings include: Aortic Atherosclerosis (ICD10-I70.0). Cholelithiasis.   02/08/2019 Cancer Staging   Staging form: Larynx - Supraglottis, AJCC 8th Edition - Clinical stage from 02/08/2019: Stage IVA (cT3, cN2c, cM0) - Signed by Eppie Gibson, MD on 02/08/2019   02/11/2019 - 04/07/2019 Chemotherapy   The patient weekly cisplatin for chemotherapy treatment.     07/20/2019 Pathology Results   PROCEDURE: Total glossectomy, total pharyngectomy, total laryngectomy, total thyroidectomy, and cervical esophagectomy TUMOR SITE: Larynx TUMOR LATERALITY: Not specified TUMOR FOCALITY: Unifocal TUMOR SIZE:    GREATEST DIMENSION: 6.9 cm    ADDITIONAL DIMENSIONS: 5.4 x 2.4 cm HISTOLOGIC TYPE: Squamous cell carcinoma, conventional (keratinizing) HISTOLOGIC GRADE:  G1-G2: Well to moderately differentiated TUMOR EXTENSION: Tumor involves the posterior tongue, pharynx, larynx, tracheal cartilage, hyoid bone, cervical esophagus, vagus nerve, carotid artery, deep cervical fascia, ans surrounding skeletal muscle and soft tissue MARGINS: Uninvolved by invasive tumor; Perineural invasion is less than 26mm from the left soft tissue margin LYMPHOVASCULAR INVASION: Not identified PERINEURAL INVASION:  Present REGIONAL LYMPH NODES:     NUMBER OF LYMPH NODES INVOLVED: 2     NUMBER OF LYMPH NODES EXAMINED: 24     LATERALITY OF LYMPH NODES INVOLVED: Cannot determine     SIZE OF LARGEST METASTATIC DEPOSIT: 0.8 cm     EXTRANODAL EXTENSION: Not identified PATHOLOGIC STAGE CLASSIFICATION (pTNM, AJCC 8TH Ed): (r)(y) pT4b pN2a DISTANT METASTASIS (pM): ADDITIONAL  PATHOLOGIC FINDINGS: Previous chemoradiation   11/04/2019 Imaging   PET: 1.  Hypermetabolic bilateral  neck lesions as detailed above are concerning for disease recurrence. 2.  Compared to 07/08/2019 PET scan, interval development of subcentimeter bilateral upper lobe pulmonary nodules, size below PET resolution. These are concerning for metastasis. Consider diagnostic quality CT chest exam for further evaluation as per oncology treatment planning. 3.  Focal hypermetabolic uptake in right ventricular cavity of indeterminate significance. Recommend echocardiographic examination to rule out underlying thrombus. This finding was discussed with Dr. Conley Canal by Dr. Clementeen Graham and Dr. Marcello Moores on 11/04/2019 at approximately 3:00 PM via telephone. 4.  Ancillary CT findings as above.   11/05/2019 Imaging   Echocardiogram: Summary   1. The left ventricle is normal in size with normal wall thickness.   2. The left ventricular systolic function is normal, LVEF is visually estimated at 60-65%.   3. The right ventricle is normal in size, with normal systolic function.   4. There is an extrinsic compression of the RV and RA laterally at the AV groove by a mass of 4.3 cm diameter. Unclear if the mass is invasive, although no pericardial effusion seen suggestive no invasion possibly. There is no evidence of hemodynamic compromise secondary to cardiac compression with normal IVC diameter.   12/06/2019 Pathology Results   A. LYMPH NODE, LEFT SUBMANDIBULAR, BIOPSY:  - Squamous cell carcinoma.  - No distinct nodal tissue identified.    12/06/2019 Procedure   Successful ultrasound-guided core biopsy of centrally necrotic left submandibular lymph node. Of note, due to extensive prior postsurgical and post radiation changes, patient has very limited mobility in the neck and the biopsy was extremely challenging    12/16/2019 -  Chemotherapy   The patient had pembrolizumab for chemotherapy treatment.     03/08/2020 PET scan   1. Diminished size of masses in the neck as described. Persistent increased metabolic activity  particularly in the LEFT neck. Some of this diminished size on the LEFT could be due to decompression of necrotic material. Correlate with any symptoms of inflammation or infection in this area as well given the paucity of residual soft tissue associated with the above abnormality, overall findings are compatible with residual disease in this location and there is no significant surrounding stranding to indicate inflammation that would correlate with the degree of FDG uptake remaining. 2. Diminished activity in the juxta cardiac mass with peripheral activity suggesting interval necrosis, on the prior study this was uniformly hypermetabolic. There is still considerable FDG uptake. 3. No signs of disease in the abdomen or pelvis. 4. 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 Correlate with any new pain or symptoms in this area with further imaging as warranted. No discrete mass or bone destruction on today's study.   06/08/2020 Imaging   CT neck 1. Status post laryngectomy and neck dissection without lymphadenopathy or mass lesion. 2. Short segment severe stenosis of the left internal carotid artery adjacent to a surgical clip.     06/08/2020 Imaging   CT chest 1. There is a large, hypodense lesion within, or very closely abutting and likely intruding upon the right ventricle, as seen on prior examination and approximately 4.6 x 4.3 cm, poorly evaluated due to cardiac motion. Findings are consistent with metastatic lesion and/or thrombus and poorly assessed by ungated CT. Consider echocardiography and cardiac MRI for further assessment.   2. Multiple small pulmonary nodules in the bilateral lung apices are unchanged. Attention on follow-up.   3.  No evidence  of new metastatic disease in the chest.   4.  Aortic Atherosclerosis (ICD10-I70.0).     09/07/2020 Imaging   1. Mild decrease in size of juxta cardiac metastatic involving the right ventricle. 2. Small pulmonary nodules are unchanged from  previous exam. 3. Aortic atherosclerosis. 4. Gallstone.     12/14/2020 Procedure   Successful exchange and up size from 20 French pull-through gastrostomy to 22 French balloon retention gastrostomy.   03/30/2021 Imaging   Ct neck 1.  In comparison with PET/CT from February 2021, marked interval decrease in size of soft tissue mass along the angle of the left mandible consistent with a positive treatment response. However, there are new destructive bony changes along the lingual surface of the mandible in this region with appearance of soft tissue infiltrating the marrow cavity. While concerning for neoplastic involvement, changes could also represent other nonspecific osteonecrosis including infection or sequela of prior radiation treatment. Recommend correlation with direct visual inspection. Further evaluation with MRI may be of value.  2.  Centrally necrotic 1.3 cm right level IIa lymph node corresponding with previously described hypermetabolic lesion, decreased in size from prior. No suspicious new or enlarged lymph nodes in the neck.  3.  Enlarged right lower paratracheal lymph node suspected in the mediastinum, incompletely imaged. Consider dedicated CT imaging of the chest.  4.  Scattered pulmonary nodules in the imaged lung apices, the largest a 9 mm lesion in the right upper lobe. Recommend comparison with outside imaging ofthe chest if available.   05/03/2021 Echocardiogram    1. Left ventricular ejection fraction, by estimation, is 55 to 60%. The left ventricle has normal function. The left ventricle has no regional wall motion abnormalities. There is mild left ventricular hypertrophy. Left ventricular diastolic parameters are consistent with Grade I diastolic dysfunction (impaired relaxation).  2. Prominent echogenic structure on the RV free wall, possibly attached to the TV - measuring about 2.0 cm in diameter and 3.0 cm in length - some mobile filamentous material is attached, suggestive  of thrombus or less likely tumor.. Right ventricular systolic function is normal. The right ventricular size is normal.  3. The mitral valve is grossly normal. Trivial mitral valve regurgitation.  4. The aortic valve is tricuspid. Aortic valve regurgitation is not visualized.     05/04/2021 Imaging   RIGHT: - There is no evidence of deep vein thrombosis in the lower extremity.   - No cystic structure found in the popliteal fossa.   LEFT: - There is no evidence of deep vein thrombosis in the lower extremity.   - No cystic structure found in the popliteal fossa.     07/12/2021 Imaging   1. A large, heterogeneous mass about and within the right ventricle is difficult to clearly assess due to cardiac motion and mixing artifact on this non tailored examination, but suspect it has significantly increased in size, and now involves the right atrium in addition to the right ventricle and pulmonary outflow tract, measuring approximately 6.9 x 4.9 cm, previously 4.5 x 4.2 cm when measured similarly. This may reflect a combination of metastatic soft tissue and thrombus, and could be more clearly assessed by echocardiography and cardiac MRI. 2. No significant change of a right upper lobe pulmonary nodule, measuring 0.8 cm, which remains modestly suspicious for a metastasis. Other small nodules are likewise stable. Attention on follow-up. 3. Status post laryngectomy and tracheostomy. 4. Coronary artery disease.   07/24/2021 Echocardiogram    1. There is an echogenic RV and  RVOT mass. Attachment appears to be on the right ventricular mid wall. Mass is 32 by 24 mm, though may be underestimated. Right ventricular systolic function is normal. The right ventricular size is normal. There is normal pulmonary artery systolic pressure.  2. Left ventricular ejection fraction, by estimation, is 60 to 65%. The left ventricle has normal function. The left ventricle has no regional wall motion abnormalities. Left  ventricular diastolic parameters are consistent with Grade I diastolic dysfunction (impaired relaxation). The average left ventricular global longitudinal strain is -19.0 %. The global longitudinal strain is normal.  3. The mitral valve is normal in structure. No evidence of mitral valve regurgitation.  4. The aortic valve is tricuspid. Aortic valve regurgitation is not visualized. No aortic stenosis is present.   Comparison(s): A prior study was performed on 05/03/21. Prior images reviewed side by side. Similar size of RV mass to slight increase. Do not see right atrial involvement in this study.   Conclusion(s)/Recommendation(s): If concerned for tumor vs thombus, cardiac MRI could be considered   08/02/2021 Imaging   1. Large RV mass measuring 38mm x 10mm x 70mm. Mass is isointense to myocardium on T1 weighted imaging, does not suppress with fat saturation, isointense on T2 weighted imaging, perfuses on first pass perfusion, and heterogenous enhancement on LGE imaging. This is consistent with tumor, and in setting of known metastatic laryngeal cancer, is consistent with metastasis. There does appear to be thrombus adherent to tumor   2. Normal LV size and systolic function (EF 17%). No late gadolinium enhancement to suggest myocardial scar   3.  Normal RV size with mild systolic dysfunction (EF 00%)   08/17/2021 - 01/18/2022 Chemotherapy   Patient is on Treatment Plan : HEAD/NECK Cisplatin q7d     10/31/2021 Imaging   1. RV mass measures 16mm x 40mm x 2mm, similar to prior cardiac MRI on 08/01/21. As described on previous MRI, mass is isointense to myocardium on T1 and T2 weighted imaging, with heterogenous enhancement on LGE imaging. This is consistent with tumor, and in setting of known metastatic laryngeal cancer, is consistent with metastasis.   2. Normal LV size and systolic function (EF 17%). No LGE to suggest myocardial scar   3.  Normal RV size with low normal systolic function (EF  49%)   01/28/2022 Imaging   RV mass concerning for malignancy   Similar in size from 10/29/21.   Normal RV function in this study.   04/30/2022 Imaging   1. Heterogeneous mass of the right ventricle is decreased in size when compared with most recent prior CT. 2. Stable mildly enlarged right lower paratracheal lymph node. 3. Stable solid pulmonary nodule of the right upper lobe measuring 8 mm. 4.  Aortic Atherosclerosis (ICD10-I70.0).     PHYSICAL EXAMINATION: ECOG PERFORMANCE STATUS: 1 - Symptomatic but completely ambulatory  Vitals:   05/03/22 1038  BP: 112/77  Pulse: 60  Resp: 18  SpO2: 97%   Filed Weights   05/03/22 1038  Weight: 143 lb 9.6 oz (65.1 kg)    GENERAL:alert, no distress and comfortable NEURO: alert & oriented x 3  LABORATORY DATA:  I have reviewed the data as listed    Component Value Date/Time   NA 138 04/30/2022 0925   K 4.1 04/30/2022 0925   CL 104 04/30/2022 0925   CO2 28 04/30/2022 0925   GLUCOSE 102 (H) 04/30/2022 0925   BUN 36 (H) 04/30/2022 0925   CREATININE 0.86 04/30/2022 0925   CREATININE 0.96  02/01/2022 0838   CALCIUM 8.7 (L) 04/30/2022 0925   PROT 7.6 04/30/2022 0925   ALBUMIN 4.4 04/30/2022 0925   AST 26 04/30/2022 0925   AST 19 08/14/2021 0905   ALT 20 04/30/2022 0925   ALT 16 08/14/2021 0905   ALKPHOS 62 04/30/2022 0925   BILITOT 0.4 04/30/2022 0925   BILITOT 0.3 08/14/2021 0905   GFRNONAA >60 04/30/2022 0925   GFRNONAA >60 02/01/2022 0838   GFRAA >60 06/08/2020 1303   GFRAA >60 02/17/2020 1128    No results found for: "SPEP", "UPEP"  Lab Results  Component Value Date   WBC 3.1 (L) 04/30/2022   NEUTROABS 1.6 (L) 04/30/2022   HGB 11.8 (L) 04/30/2022   HCT 34.7 (L) 04/30/2022   MCV 88.3 04/30/2022   PLT 161 04/30/2022      Chemistry      Component Value Date/Time   NA 138 04/30/2022 0925   K 4.1 04/30/2022 0925   CL 104 04/30/2022 0925   CO2 28 04/30/2022 0925   BUN 36 (H) 04/30/2022 0925   CREATININE 0.86  04/30/2022 0925   CREATININE 0.96 02/01/2022 0838      Component Value Date/Time   CALCIUM 8.7 (L) 04/30/2022 0925   ALKPHOS 62 04/30/2022 0925   AST 26 04/30/2022 0925   AST 19 08/14/2021 0905   ALT 20 04/30/2022 0925   ALT 16 08/14/2021 0905   BILITOT 0.4 04/30/2022 0925   BILITOT 0.3 08/14/2021 0905       RADIOGRAPHIC STUDIES: I have reviewed multiple imaging studies with the patient and her son I have personally reviewed the radiological images as listed and agreed with the findings in the report. CT CHEST W CONTRAST  Result Date: 04/30/2022 CLINICAL DATA:  And neck cancer; assess treatment response; * Tracking Code: BO * EXAM: CT CHEST WITH CONTRAST TECHNIQUE: Multidetector CT imaging of the chest was performed during intravenous contrast administration. RADIATION DOSE REDUCTION: This exam was performed according to the departmental dose-optimization program which includes automated exposure control, adjustment of the mA and/or kV according to patient size and/or use of iterative reconstruction technique. CONTRAST:  13mL OMNIPAQUE IOHEXOL 300 MG/ML  SOLN COMPARISON:  Chest CT dated July 11, 2021 FINDINGS: Cardiovascular: Normal heart size. Heterogeneous lesion of the right ventricle, measuring measuring 5.1 x 3.3 cm in greatest diameter, previously 6.9 x 4.8 cm, decreased in size. No pericardial effusion. Normal caliber thoracic aorta with moderate atherosclerotic disease. Right chest wall port with tip in the right atrium. No suspicious filling defects of the pulmonary arteries. Mediastinum/Nodes: Esophagus is unremarkable. Right lower paratracheal lymph node measuring 1.2 cm in short axis on series 2, image 62, unchanged in size when remeasured in similar plane. Status post laryngectomy and thyroidectomy. Lungs/Pleura: Central airways are patent. Tracheostomy tube in place. Reticular opacities of the anterior upper lobes, likely postradiation change. Stable nodular opacity of the right  upper lobe measuring 8 mm on series 7, image 51. No consolidation, pleural effusion or pneumothorax. Upper Abdomen: Cholelithiasis. Percutaneous gastrostomy tube. No acute abnormality. Musculoskeletal: No chest wall abnormality. No acute or significant osseous findings. IMPRESSION: 1. Heterogeneous mass of the right ventricle is decreased in size when compared with most recent prior CT. 2. Stable mildly enlarged right lower paratracheal lymph node. 3. Stable solid pulmonary nodule of the right upper lobe measuring 8 mm. 4.  Aortic Atherosclerosis (ICD10-I70.0). Electronically Signed   By: Yetta Glassman M.D.   On: 04/30/2022 16:26

## 2022-05-03 NOTE — Assessment & Plan Note (Signed)
She has mild persistent pancytopenia from prior treatment She denies recent bleeding complications from anticoagulation therapy I will monitor closely

## 2022-05-07 ENCOUNTER — Other Ambulatory Visit: Payer: Self-pay

## 2022-05-07 NOTE — Progress Notes (Signed)
Oncology Nurse Navigator Documentation   I received a voicemail from [place] who reported that her PEG had fallen out and they weren't sure how to proceed. I called [name] back who told me that [place] did not want her PEG replaced at this time because she was taking her nutrition and medicine orally without difficulty. I did advise that I was able to get an appointment to have it replaced today but they declined at this time. I advised them to cover the stoma with a gauze and to let us know if it did not heal properly. They have my direct call back number if they have any questions or concerns.   Harlow Asa RN, BSN, OCN Head & Neck Oncology Nurse [place] at [facility] Phone # [phone]  Fax # [phone]

## 2022-05-20 DIAGNOSIS — C321 Malignant neoplasm of supraglottis: Secondary | ICD-10-CM | POA: Diagnosis not present

## 2022-05-20 DIAGNOSIS — R633 Feeding difficulties, unspecified: Secondary | ICD-10-CM | POA: Diagnosis not present

## 2022-05-24 ENCOUNTER — Other Ambulatory Visit: Payer: Self-pay | Admitting: Hematology and Oncology

## 2022-05-24 ENCOUNTER — Other Ambulatory Visit (HOSPITAL_COMMUNITY): Payer: Self-pay

## 2022-05-24 DIAGNOSIS — C321 Malignant neoplasm of supraglottis: Secondary | ICD-10-CM

## 2022-05-24 MED ORDER — HYDROCODONE-ACETAMINOPHEN 5-325 MG PO TABS
1.0000 | ORAL_TABLET | Freq: Four times a day (QID) | ORAL | 0 refills | Status: DC | PRN
  Filled 2022-05-24: qty 60, 15d supply, fill #0

## 2022-05-28 ENCOUNTER — Encounter: Payer: Medicare HMO | Admitting: Nutrition

## 2022-05-28 ENCOUNTER — Telehealth: Payer: Self-pay | Admitting: Nutrition

## 2022-05-28 NOTE — Telephone Encounter (Signed)
Received phone call from patient's friend [name].  Patient concerned regarding cost of tube feeding.  She is using 4 cartons of [employer] 1.5 via feeding tube every day.  She supplements with 1 carton of Ensure Plus or equivalent daily.  They are receiving [employer] [place] through [place].  Explained there are less expensive formulas however patient not willing to change formula.  Discussed options including increasing Ensure Plus or equivalent and decreasing [employer] 1.5.  Patient has had tolerance issues with other formulas.  At this time, patient will attempt to increase Ensure Plus or equivalent and decrease [employer] 1.5 and assess tolerance.  No change in orders at this time.  Await patient's decision based on tolerance.

## 2022-06-21 ENCOUNTER — Other Ambulatory Visit: Payer: Self-pay

## 2022-06-21 ENCOUNTER — Inpatient Hospital Stay: Payer: Medicare HMO | Attending: Hematology

## 2022-06-21 ENCOUNTER — Other Ambulatory Visit (HOSPITAL_COMMUNITY): Payer: Self-pay

## 2022-06-21 ENCOUNTER — Inpatient Hospital Stay: Payer: Medicare HMO | Admitting: Hematology and Oncology

## 2022-06-21 VITALS — BP 123/68 | HR 77 | Temp 97.8°F | Resp 18 | Ht 62.0 in | Wt 144.6 lb

## 2022-06-21 DIAGNOSIS — C329 Malignant neoplasm of larynx, unspecified: Secondary | ICD-10-CM

## 2022-06-21 DIAGNOSIS — R1310 Dysphagia, unspecified: Secondary | ICD-10-CM | POA: Insufficient documentation

## 2022-06-21 DIAGNOSIS — G893 Neoplasm related pain (acute) (chronic): Secondary | ICD-10-CM

## 2022-06-21 DIAGNOSIS — C7800 Secondary malignant neoplasm of unspecified lung: Secondary | ICD-10-CM | POA: Diagnosis not present

## 2022-06-21 DIAGNOSIS — Z95828 Presence of other vascular implants and grafts: Secondary | ICD-10-CM

## 2022-06-21 DIAGNOSIS — C321 Malignant neoplasm of supraglottis: Secondary | ICD-10-CM

## 2022-06-21 DIAGNOSIS — E039 Hypothyroidism, unspecified: Secondary | ICD-10-CM

## 2022-06-21 LAB — COMPREHENSIVE METABOLIC PANEL
ALT: 20 U/L (ref 0–44)
AST: 24 U/L (ref 15–41)
Albumin: 4.4 g/dL (ref 3.5–5.0)
Alkaline Phosphatase: 57 U/L (ref 38–126)
Anion gap: 8 (ref 5–15)
BUN: 30 mg/dL — ABNORMAL HIGH (ref 8–23)
CO2: 27 mmol/L (ref 22–32)
Calcium: 8.9 mg/dL (ref 8.9–10.3)
Chloride: 103 mmol/L (ref 98–111)
Creatinine, Ser: 0.98 mg/dL (ref 0.44–1.00)
GFR, Estimated: >60 mL/min (ref >60)
Glucose, Bld: 107 mg/dL — ABNORMAL HIGH (ref 70–99)
Potassium: 4.2 mmol/L (ref 3.5–5.1)
Sodium: 138 mmol/L (ref 135–145)
Total Bilirubin: 0.5 mg/dL (ref 0.3–1.2)
Total Protein: 7.6 g/dL (ref 6.5–8.1)

## 2022-06-21 LAB — CBC WITH DIFFERENTIAL/PLATELET
Abs Immature Granulocytes: 0 10*3/uL (ref 0.00–0.07)
Basophils Absolute: 0 10*3/uL (ref 0.0–0.1)
Basophils Relative: 0 %
Eosinophils Absolute: 0.1 10*3/uL (ref 0.0–0.5)
Eosinophils Relative: 3 %
HCT: 37.1 % (ref 36.0–46.0)
Hemoglobin: 12.7 g/dL (ref 12.0–15.0)
Immature Granulocytes: 0 %
Lymphocytes Relative: 30 %
Lymphs Abs: 1.2 10*3/uL (ref 0.7–4.0)
MCH: 29.6 pg (ref 26.0–34.0)
MCHC: 34.2 g/dL (ref 30.0–36.0)
MCV: 86.5 fL (ref 80.0–100.0)
Monocytes Absolute: 0.5 10*3/uL (ref 0.1–1.0)
Monocytes Relative: 12 %
Neutro Abs: 2.3 10*3/uL (ref 1.7–7.7)
Neutrophils Relative %: 55 %
Platelets: 167 10*3/uL (ref 150–400)
RBC: 4.29 MIL/uL (ref 3.87–5.11)
RDW: 13.2 % (ref 11.5–15.5)
WBC: 4.2 10*3/uL (ref 4.0–10.5)
nRBC: 0 % (ref 0.0–0.2)

## 2022-06-21 LAB — TSH: TSH: 5.757 u[IU]/mL — ABNORMAL HIGH (ref 0.350–4.500)

## 2022-06-21 MED ORDER — SODIUM CHLORIDE 0.9% FLUSH
10.0000 mL | Freq: Once | INTRAVENOUS | Status: AC
  Administered 2022-06-21: 10 mL

## 2022-06-21 MED ORDER — APIXABAN 2.5 MG PO TABS
2.5000 mg | ORAL_TABLET | Freq: Two times a day (BID) | ORAL | 3 refills | Status: DC
  Filled 2022-06-21: qty 60, 30d supply, fill #0
  Filled 2022-07-19: qty 60, 30d supply, fill #1
  Filled 2022-08-20: qty 60, 30d supply, fill #2
  Filled 2022-09-19: qty 60, 30d supply, fill #3

## 2022-06-21 MED ORDER — HEPARIN SOD (PORK) LOCK FLUSH 100 UNIT/ML IV SOLN
500.0000 [IU] | Freq: Once | INTRAVENOUS | Status: AC
  Administered 2022-06-21: 500 [IU]

## 2022-06-21 MED ORDER — HYDROCODONE-ACETAMINOPHEN 5-325 MG PO TABS
1.0000 | ORAL_TABLET | Freq: Four times a day (QID) | ORAL | 0 refills | Status: DC | PRN
  Filled 2022-06-21: qty 60, 15d supply, fill #0

## 2022-06-22 NOTE — Assessment & Plan Note (Signed)
Her last imaging study showed no evidence of disease growth She is not symptomatic We will continue close monitoring with blood work and flush every 6 to 8 weeks and repeat imaging study again mid November

## 2022-06-22 NOTE — Assessment & Plan Note (Signed)
She has stable pain control She will continue the same medications for pain

## 2022-06-22 NOTE — Assessment & Plan Note (Signed)
Her feeding tube had recently fall out So far she is able to maintain adequate nutrition by drinking her nutritional supplement She will continue the same

## 2022-06-22 NOTE — Progress Notes (Signed)
[facility] OFFICE PROGRESS NOTE  Patient Care Team: Hamrick, Lorin Mercy, MD as PCP - General (Family Medicine) Heath Lark, MD as Consulting Physician (Hematology and Oncology)  ASSESSMENT & PLAN:  Laryngeal squamous cell carcinoma ([place]) Her last imaging study showed no evidence of disease growth She is not symptomatic We will continue close monitoring with blood work and flush every 6 to 8 weeks and repeat imaging study again mid November  Cancer associated pain She has stable pain control She will continue the same medications for pain  Dysphagia Her feeding tube had recently fall out So far she is able to maintain adequate nutrition by drinking her nutritional supplement She will continue the same  Orders Placed This Encounter  Procedures   CT CHEST W CONTRAST    Standing Status:   Future    Standing Expiration Date:   06/22/2023    Order Specific Question:   If indicated for the ordered procedure, I authorize the administration of contrast media per Radiology protocol    Answer:   Yes    Order Specific Question:   Preferred imaging location?    Answer:   [facility]    Order Specific Question:   Radiology Contrast Protocol - do NOT remove file path    Answer:   [email or web address]    All questions were answered. The patient knows to call the clinic with any problems, questions or concerns. The total time spent in the appointment was 30 minutes encounter with patients including review of chart and various tests results, discussions about plan of care and coordination of care plan   Heath Lark, MD 06/22/2022 8:10 PM  INTERVAL HISTORY: Please see below for problem oriented charting. she returns for follow-up with her son Her G tube fell out a few weeks ago She can swallow her nutritional supplement without choking Chronic pain is stable The patient denies any recent signs or symptoms of bleeding such as  spontaneous epistaxis, hematuria or hematochezia.   REVIEW OF SYSTEMS:   Constitutional: Denies fevers, chills or abnormal weight loss Eyes: Denies blurriness of vision Ears, nose, mouth, throat, and face: Denies mucositis or sore throat Respiratory: Denies cough, dyspnea or wheezes Cardiovascular: Denies palpitation, chest discomfort or lower extremity swelling Gastrointestinal:  Denies nausea, heartburn or change in bowel habits Skin: Denies abnormal skin rashes Lymphatics: Denies new lymphadenopathy or easy bruising Neurological:Denies numbness, tingling or new weaknesses Behavioral/Psych: Mood is stable, no new changes  All other systems were reviewed with the patient and are negative.  I have reviewed the past medical history, past surgical history, social history and family history with the patient and they are unchanged from previous note.  ALLERGIES:  has no active allergies.  MEDICATIONS:  Current Outpatient Medications  Medication Sig Dispense Refill   apixaban (ELIQUIS) 2.5 MG TABS tablet Take 1 tablet (2.5 mg total) by mouth 2 (two) times daily. 60 tablet 3   Cholecalciferol (VITAMIN D3) 125 MCG (5000 UT) CAPS Take 5,000 Units by mouth daily.     HYDROcodone-acetaminophen (NORCO/VICODIN) 5-325 MG tablet TAKE 1 TABLET BY MOUTH EVERY 6 HOURS AS NEEDED FOR MODERATE PAIN 60 tablet 0   levothyroxine (SYNTHROID) 137 MCG tablet Take 137 mcg by mouth daily before breakfast.     lidocaine-prilocaine (EMLA) cream Apply topically daily as needed. 30 g 9   Nutritional Supplements (KATE FARMS PEPTIDE 1.5) LIQD Take 4 Bottles by mouth daily. 325 mL 11   oxymetazoline (AFRIN) 0.05 % nasal spray Place 3  sprays into both nostrils 2 (two) times daily as needed (Bleeding from stoma). 30 mL 0   No current facility-administered medications for this visit.    SUMMARY OF ONCOLOGIC HISTORY: Oncology History Overview Note  Progressed on pembrolizumab   Laryngeal squamous cell carcinoma ([place])   12/02/2018 Imaging   CT neck: IMPRESSION: 1. Supraglottic laryngeal carcinoma extending across the anterior commissure and invading the prelaryngeal fat, left aryepiglottic fold and left aspect of the epiglottis. 2. Severe narrowing of the laryngeal airway. 3. Bilateral subcentimeter level 2A and 2A cervical lymph nodes.     12/02/2018 Imaging   CT  Chest:  IMPRESSION: No evidence of metastatic disease in the chest.   12/03/2018 Pathology Results   (Care Everywhere; [facility]) A: Larynx, supraglottis, biopsy - Atypical squamous proliferation with at least low grade dysplasia and abundant keratinization - No definite high grade dysplasia or invasive carcinoma represented (see comment)   12/18/2018 Pathology Results   (Care Everywhere; [facility]) A:  Neck, left, fine needle aspiration - Rare single atypical cells, no cells diagnostic of malignancy - Lymph node material present  B:  Neck, right, fine needle aspiration - Non-diagnostic specimen - No lymph node material identified - Mixed inflammation and blood   12/31/2018 Pathology Results   (Care Everywhere; [facility])  A: Larynx, left supraglottic mass, biopsy - Atypical squamous proliferation with abundant keratinization, suspicious for well-differentiated invasive squamous cell carcinoma  B: Larynx, left supraglottic mass, biopsy - Well-differentiated invasive squamous cell carcinoma with abundant keratinization (see comment)   01/18/2019 Initial Diagnosis   Laryngeal cancer ([place])   01/26/2019 Imaging   1. Locally advanced hypermetabolic laryngeal neoplasm centered in the left supraglottic space with bilateral glottic and epiglottic involvement and subglottic extension on the left. 2. Hypermetabolic bilateral level 3 neck nodal metastases. 3. Hypermetabolic high mediastinal nodal metastases between the trachea and upper thoracic esophagus. 4. Otherwise no distant hypermetabolic metastatic disease. 5. Nodular 7 mm  focus at the left lung base, below PET resolution, recommend attention on follow-up chest CT in 3 months. 6. Chronic findings include: Aortic Atherosclerosis (ICD10-I70.0). Cholelithiasis.   02/08/2019 Cancer Staging   Staging form: Larynx - Supraglottis, AJCC 8th Edition - Clinical stage from 02/08/2019: Stage IVA (cT3, cN2c, cM0) - Signed by Eppie Gibson, MD on 02/08/2019   02/11/2019 - 04/07/2019 Chemotherapy   The patient weekly cisplatin for chemotherapy treatment.     07/20/2019 Pathology Results   PROCEDURE: Total glossectomy, total pharyngectomy, total laryngectomy, total thyroidectomy, and cervical esophagectomy TUMOR SITE: Larynx TUMOR LATERALITY: Not specified TUMOR FOCALITY: Unifocal TUMOR SIZE:    GREATEST DIMENSION: 6.9 cm    ADDITIONAL DIMENSIONS: 5.4 x 2.4 cm HISTOLOGIC TYPE: Squamous cell carcinoma, conventional (keratinizing) HISTOLOGIC GRADE:  G1-G2: Well to moderately differentiated TUMOR EXTENSION: Tumor involves the posterior tongue, pharynx, larynx, tracheal cartilage, hyoid bone, cervical esophagus, vagus nerve, carotid artery, deep cervical fascia, ans surrounding skeletal muscle and soft tissue MARGINS: Uninvolved by invasive tumor; Perineural invasion is less than 59mm from the left soft tissue margin LYMPHOVASCULAR INVASION: Not identified PERINEURAL INVASION:  Present REGIONAL LYMPH NODES:     NUMBER OF LYMPH NODES INVOLVED: 2     NUMBER OF LYMPH NODES EXAMINED: 24     LATERALITY OF LYMPH NODES INVOLVED: Cannot determine     SIZE OF LARGEST METASTATIC DEPOSIT: 0.8 cm     EXTRANODAL EXTENSION: Not identified PATHOLOGIC STAGE CLASSIFICATION (pTNM, AJCC 8TH Ed): (r)(y) pT4b pN2a DISTANT METASTASIS (pM): ADDITIONAL PATHOLOGIC FINDINGS: Previous chemoradiation  11/04/2019 Imaging   PET: 1.  Hypermetabolic bilateral neck lesions as detailed above are concerning for disease recurrence. 2.  Compared to 07/08/2019 PET scan, interval development of  subcentimeter bilateral upper lobe pulmonary nodules, size below PET resolution. These are concerning for metastasis. Consider diagnostic quality CT chest exam for further evaluation as per oncology treatment planning. 3.  Focal hypermetabolic uptake in right ventricular cavity of indeterminate significance. Recommend echocardiographic examination to rule out underlying thrombus. This finding was discussed with Dr. Conley Canal by Dr. Clementeen Graham and Dr. Marcello Moores on 11/04/2019 at approximately 3:00 PM via telephone. 4.  Ancillary CT findings as above.   11/05/2019 Imaging   Echocardiogram: Summary   1. The left ventricle is normal in size with normal wall thickness.   2. The left ventricular systolic function is normal, LVEF is visually estimated at 60-65%.   3. The right ventricle is normal in size, with normal systolic function.   4. There is an extrinsic compression of the RV and RA laterally at the AV groove by a mass of 4.3 cm diameter. Unclear if the mass is invasive, although no pericardial effusion seen suggestive no invasion possibly. There is no evidence of hemodynamic compromise secondary to cardiac compression with normal IVC diameter.   12/06/2019 Pathology Results   A. LYMPH NODE, LEFT SUBMANDIBULAR, BIOPSY:  - Squamous cell carcinoma.  - No distinct nodal tissue identified.    12/06/2019 Procedure   Successful ultrasound-guided core biopsy of centrally necrotic left submandibular lymph node. Of note, due to extensive prior postsurgical and post radiation changes, patient has very limited mobility in the neck and the biopsy was extremely challenging    12/16/2019 -  Chemotherapy   The patient had pembrolizumab for chemotherapy treatment.     03/08/2020 PET scan   1. Diminished size of masses in the neck as described. Persistent increased metabolic activity particularly in the LEFT neck. Some of this diminished size on the LEFT could be due to decompression of necrotic material. Correlate  with any symptoms of inflammation or infection in this area as well given the paucity of residual soft tissue associated with the above abnormality, overall findings are compatible with residual disease in this location and there is no significant surrounding stranding to indicate inflammation that would correlate with the degree of FDG uptake remaining. 2. Diminished activity in the juxta cardiac mass with peripheral activity suggesting interval necrosis, on the prior study this was uniformly hypermetabolic. There is still considerable FDG uptake. 3. No signs of disease in the abdomen or pelvis. 4. 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 Correlate with any new pain or symptoms in this area with further imaging as warranted. No discrete mass or bone destruction on today's study.   06/08/2020 Imaging   CT neck 1. Status post laryngectomy and neck dissection without lymphadenopathy or mass lesion. 2. Short segment severe stenosis of the left internal carotid artery adjacent to a surgical clip.     06/08/2020 Imaging   CT chest 1. There is a large, hypodense lesion within, or very closely abutting and likely intruding upon the right ventricle, as seen on prior examination and approximately 4.6 x 4.3 cm, poorly evaluated due to cardiac motion. Findings are consistent with metastatic lesion and/or thrombus and poorly assessed by ungated CT. Consider echocardiography and cardiac MRI for further assessment.   2. Multiple small pulmonary nodules in the bilateral lung apices are unchanged. Attention on follow-up.   3.  No evidence of new metastatic disease in the  chest.   4.  Aortic Atherosclerosis (ICD10-I70.0).     09/07/2020 Imaging   1. Mild decrease in size of juxta cardiac metastatic involving the right ventricle. 2. Small pulmonary nodules are unchanged from previous exam. 3. Aortic atherosclerosis. 4. Gallstone.     12/14/2020 Procedure   Successful exchange and up size from 20  French pull-through gastrostomy to 22 French balloon retention gastrostomy.   03/30/2021 Imaging   Ct neck 1.  In comparison with PET/CT from February 2021, marked interval decrease in size of soft tissue mass along the angle of the left mandible consistent with a positive treatment response. However, there are new destructive bony changes along the lingual surface of the mandible in this region with appearance of soft tissue infiltrating the marrow cavity. While concerning for neoplastic involvement, changes could also represent other nonspecific osteonecrosis including infection or sequela of prior radiation treatment. Recommend correlation with direct visual inspection. Further evaluation with MRI may be of value.  2.  Centrally necrotic 1.3 cm right level IIa lymph node corresponding with previously described hypermetabolic lesion, decreased in size from prior. No suspicious new or enlarged lymph nodes in the neck.  3.  Enlarged right lower paratracheal lymph node suspected in the mediastinum, incompletely imaged. Consider dedicated CT imaging of the chest.  4.  Scattered pulmonary nodules in the imaged lung apices, the largest a 9 mm lesion in the right upper lobe. Recommend comparison with outside imaging ofthe chest if available.   05/03/2021 Echocardiogram    1. Left ventricular ejection fraction, by estimation, is 55 to 60%. The left ventricle has normal function. The left ventricle has no regional wall motion abnormalities. There is mild left ventricular hypertrophy. Left ventricular diastolic parameters are consistent with Grade I diastolic dysfunction (impaired relaxation).  2. Prominent echogenic structure on the RV free wall, possibly attached to the TV - measuring about 2.0 cm in diameter and 3.0 cm in length - some mobile filamentous material is attached, suggestive of thrombus or less likely tumor.. Right ventricular systolic function is normal. The right ventricular size is normal.  3. The  mitral valve is grossly normal. Trivial mitral valve regurgitation.  4. The aortic valve is tricuspid. Aortic valve regurgitation is not visualized.     05/04/2021 Imaging   RIGHT: - There is no evidence of deep vein thrombosis in the lower extremity.   - No cystic structure found in the popliteal fossa.   LEFT: - There is no evidence of deep vein thrombosis in the lower extremity.   - No cystic structure found in the popliteal fossa.     07/12/2021 Imaging   1. A large, heterogeneous mass about and within the right ventricle is difficult to clearly assess due to cardiac motion and mixing artifact on this non tailored examination, but suspect it has significantly increased in size, and now involves the right atrium in addition to the right ventricle and pulmonary outflow tract, measuring approximately 6.9 x 4.9 cm, previously 4.5 x 4.2 cm when measured similarly. This may reflect a combination of metastatic soft tissue and thrombus, and could be more clearly assessed by echocardiography and cardiac MRI. 2. No significant change of a right upper lobe pulmonary nodule, measuring 0.8 cm, which remains modestly suspicious for a metastasis. Other small nodules are likewise stable. Attention on follow-up. 3. Status post laryngectomy and tracheostomy. 4. Coronary artery disease.   07/24/2021 Echocardiogram    1. There is an echogenic RV and RVOT mass. Attachment appears to be  on the right ventricular mid wall. Mass is 32 by 24 mm, though may be underestimated. Right ventricular systolic function is normal. The right ventricular size is normal. There is normal pulmonary artery systolic pressure.  2. Left ventricular ejection fraction, by estimation, is 60 to 65%. The left ventricle has normal function. The left ventricle has no regional wall motion abnormalities. Left ventricular diastolic parameters are consistent with Grade I diastolic dysfunction (impaired relaxation). The average left ventricular  global longitudinal strain is -19.0 %. The global longitudinal strain is normal.  3. The mitral valve is normal in structure. No evidence of mitral valve regurgitation.  4. The aortic valve is tricuspid. Aortic valve regurgitation is not visualized. No aortic stenosis is present.   Comparison(s): A prior study was performed on 05/03/21. Prior images reviewed side by side. Similar size of RV mass to slight increase. Do not see right atrial involvement in this study.   Conclusion(s)/Recommendation(s): If concerned for tumor vs thombus, cardiac MRI could be considered   08/02/2021 Imaging   1. Large RV mass measuring 74mm x 65mm x 28mm. Mass is isointense to myocardium on T1 weighted imaging, does not suppress with fat saturation, isointense on T2 weighted imaging, perfuses on first pass perfusion, and heterogenous enhancement on LGE imaging. This is consistent with tumor, and in setting of known metastatic laryngeal cancer, is consistent with metastasis. There does appear to be thrombus adherent to tumor   2. Normal LV size and systolic function (EF 20%). No late gadolinium enhancement to suggest myocardial scar   3.  Normal RV size with mild systolic dysfunction (EF 25%)   08/17/2021 - 01/18/2022 Chemotherapy   Patient is on Treatment Plan : HEAD/NECK Cisplatin q7d     10/31/2021 Imaging   1. RV mass measures 42mm x 41mm x 12mm, similar to prior cardiac MRI on 08/01/21. As described on previous MRI, mass is isointense to myocardium on T1 and T2 weighted imaging, with heterogenous enhancement on LGE imaging. This is consistent with tumor, and in setting of known metastatic laryngeal cancer, is consistent with metastasis.   2. Normal LV size and systolic function (EF 42%). No LGE to suggest myocardial scar   3.  Normal RV size with low normal systolic function (EF 70%)   01/28/2022 Imaging   RV mass concerning for malignancy   Similar in size from 10/29/21.   Normal RV function in this study.    04/30/2022 Imaging   1. Heterogeneous mass of the right ventricle is decreased in size when compared with most recent prior CT. 2. Stable mildly enlarged right lower paratracheal lymph node. 3. Stable solid pulmonary nodule of the right upper lobe measuring 8 mm. 4.  Aortic Atherosclerosis (ICD10-I70.0).     PHYSICAL EXAMINATION: ECOG PERFORMANCE STATUS: 1 - Symptomatic but completely ambulatory  Vitals:   06/21/22 0905  BP: 123/68  Pulse: 77  Resp: 18  Temp: 97.8 F (36.6 C)  SpO2: 96%   Filed Weights   06/21/22 0905  Weight: 144 lb 9.6 oz (65.6 kg)    GENERAL:alert, no distress and comfortable NEURO: alert & oriented x 3 , no focal motor/sensory deficits  LABORATORY DATA:  I have reviewed the data as listed    Component Value Date/Time   NA 138 06/21/2022 0817   K 4.2 06/21/2022 0817   CL 103 06/21/2022 0817   CO2 27 06/21/2022 0817   GLUCOSE 107 (H) 06/21/2022 0817   BUN 30 (H) 06/21/2022 0817   CREATININE 0.98 06/21/2022  0817   CREATININE 0.96 02/01/2022 0838   CALCIUM 8.9 06/21/2022 0817   PROT 7.6 06/21/2022 0817   ALBUMIN 4.4 06/21/2022 0817   AST 24 06/21/2022 0817   AST 19 08/14/2021 0905   ALT 20 06/21/2022 0817   ALT 16 08/14/2021 0905   ALKPHOS 57 06/21/2022 0817   BILITOT 0.5 06/21/2022 0817   BILITOT 0.3 08/14/2021 0905   GFRNONAA >60 06/21/2022 0817   GFRNONAA >60 02/01/2022 0838   GFRAA >60 06/08/2020 1303   GFRAA >60 02/17/2020 1128    No results found for: "SPEP", "UPEP"  Lab Results  Component Value Date   WBC 4.2 06/21/2022   NEUTROABS 2.3 06/21/2022   HGB 12.7 06/21/2022   HCT 37.1 06/21/2022   MCV 86.5 06/21/2022   PLT 167 06/21/2022      Chemistry      Component Value Date/Time   NA 138 06/21/2022 0817   K 4.2 06/21/2022 0817   CL 103 06/21/2022 0817   CO2 27 06/21/2022 0817   BUN 30 (H) 06/21/2022 0817   CREATININE 0.98 06/21/2022 0817   CREATININE 0.96 02/01/2022 0838      Component Value Date/Time   CALCIUM 8.9  06/21/2022 0817   ALKPHOS 57 06/21/2022 0817   AST 24 06/21/2022 0817   AST 19 08/14/2021 0905   ALT 20 06/21/2022 0817   ALT 16 08/14/2021 0905   BILITOT 0.5 06/21/2022 0817   BILITOT 0.3 08/14/2021 0905

## 2022-06-23 ENCOUNTER — Encounter: Payer: Self-pay | Admitting: Hematology and Oncology

## 2022-06-27 ENCOUNTER — Telehealth: Payer: Self-pay

## 2022-06-27 NOTE — Patient Outreach (Signed)
  Care Coordination   Initial Visit Note   06/27/2022 Name: DALIYA PARCHMENT MRN: [id number] DOB: August 03, 1951  Lorri Frederick Rightmyer is a 71 y.o. year old female who sees Hamrick, Lorin Mercy, MD for primary care. I spoke with  Mena Pauls by phone today.  What matters to the patients health and wellness today?  Placed call to patient and her friend spoke for patient [name]).  Reviewed [facility] care coordination program.  [name] reports that patient is doing well and declines assistance at this time.      SDOH assessments and interventions completed:  No     Care Coordination Interventions Activated:  No  Care Coordination Interventions:  No, not indicated   Follow up plan: No further intervention required.   Encounter Outcome:  Pt. Refused   Tomasa Rand, RN, BSN, CEN [facility] [employer] [phone]

## 2022-07-10 DIAGNOSIS — R633 Feeding difficulties, unspecified: Secondary | ICD-10-CM | POA: Diagnosis not present

## 2022-07-10 DIAGNOSIS — C321 Malignant neoplasm of supraglottis: Secondary | ICD-10-CM | POA: Diagnosis not present

## 2022-07-12 ENCOUNTER — Other Ambulatory Visit (HOSPITAL_COMMUNITY): Payer: Self-pay

## 2022-07-12 ENCOUNTER — Other Ambulatory Visit: Payer: Self-pay | Admitting: Hematology and Oncology

## 2022-07-12 DIAGNOSIS — C321 Malignant neoplasm of supraglottis: Secondary | ICD-10-CM

## 2022-07-12 MED ORDER — HYDROCODONE-ACETAMINOPHEN 5-325 MG PO TABS
1.0000 | ORAL_TABLET | Freq: Four times a day (QID) | ORAL | 0 refills | Status: DC | PRN
  Filled 2022-07-12: qty 60, 15d supply, fill #0

## 2022-07-19 ENCOUNTER — Other Ambulatory Visit (HOSPITAL_COMMUNITY): Payer: Self-pay

## 2022-07-19 DIAGNOSIS — R633 Feeding difficulties, unspecified: Secondary | ICD-10-CM | POA: Diagnosis not present

## 2022-07-19 DIAGNOSIS — C321 Malignant neoplasm of supraglottis: Secondary | ICD-10-CM | POA: Diagnosis not present

## 2022-08-02 ENCOUNTER — Inpatient Hospital Stay: Payer: Medicare HMO | Attending: Hematology

## 2022-08-02 ENCOUNTER — Other Ambulatory Visit (HOSPITAL_COMMUNITY): Payer: Self-pay

## 2022-08-02 ENCOUNTER — Telehealth: Payer: Self-pay

## 2022-08-02 ENCOUNTER — Inpatient Hospital Stay: Payer: Medicare HMO

## 2022-08-02 ENCOUNTER — Ambulatory Visit (HOSPITAL_COMMUNITY)
Admission: RE | Admit: 2022-08-02 | Discharge: 2022-08-02 | Disposition: A | Discharge status: Home / Self Care | Payer: Medicare HMO | Source: Ambulatory Visit | Attending: Hematology and Oncology | Admitting: Hematology and Oncology

## 2022-08-02 ENCOUNTER — Other Ambulatory Visit: Payer: Self-pay | Admitting: Hematology and Oncology

## 2022-08-02 DIAGNOSIS — Z79899 Other long term (current) drug therapy: Secondary | ICD-10-CM | POA: Diagnosis not present

## 2022-08-02 DIAGNOSIS — Z9221 Personal history of antineoplastic chemotherapy: Secondary | ICD-10-CM | POA: Diagnosis not present

## 2022-08-02 DIAGNOSIS — Z7901 Long term (current) use of anticoagulants: Secondary | ICD-10-CM | POA: Diagnosis not present

## 2022-08-02 DIAGNOSIS — C321 Malignant neoplasm of supraglottis: Secondary | ICD-10-CM

## 2022-08-02 DIAGNOSIS — E039 Hypothyroidism, unspecified: Secondary | ICD-10-CM | POA: Insufficient documentation

## 2022-08-02 DIAGNOSIS — Z79890 Hormone replacement therapy (postmenopausal): Secondary | ICD-10-CM | POA: Insufficient documentation

## 2022-08-02 DIAGNOSIS — I2510 Atherosclerotic heart disease of native coronary artery without angina pectoris: Secondary | ICD-10-CM | POA: Diagnosis not present

## 2022-08-02 DIAGNOSIS — R911 Solitary pulmonary nodule: Secondary | ICD-10-CM | POA: Diagnosis not present

## 2022-08-02 DIAGNOSIS — C7800 Secondary malignant neoplasm of unspecified lung: Secondary | ICD-10-CM | POA: Insufficient documentation

## 2022-08-02 DIAGNOSIS — Z923 Personal history of irradiation: Secondary | ICD-10-CM | POA: Insufficient documentation

## 2022-08-02 DIAGNOSIS — L309 Dermatitis, unspecified: Secondary | ICD-10-CM | POA: Insufficient documentation

## 2022-08-02 DIAGNOSIS — C329 Malignant neoplasm of larynx, unspecified: Secondary | ICD-10-CM | POA: Insufficient documentation

## 2022-08-02 DIAGNOSIS — G893 Neoplasm related pain (acute) (chronic): Secondary | ICD-10-CM | POA: Insufficient documentation

## 2022-08-02 DIAGNOSIS — Z95828 Presence of other vascular implants and grafts: Secondary | ICD-10-CM

## 2022-08-02 LAB — CBC WITH DIFFERENTIAL/PLATELET
Abs Immature Granulocytes: 0.01 10*3/uL (ref 0.00–0.07)
Basophils Absolute: 0 10*3/uL (ref 0.0–0.1)
Basophils Relative: 0 %
Eosinophils Absolute: 0.2 10*3/uL (ref 0.0–0.5)
Eosinophils Relative: 6 %
HCT: 38.2 % (ref 36.0–46.0)
Hemoglobin: 12.8 g/dL (ref 12.0–15.0)
Immature Granulocytes: 0 %
Lymphocytes Relative: 26 %
Lymphs Abs: 1.1 10*3/uL (ref 0.7–4.0)
MCH: 29.4 pg (ref 26.0–34.0)
MCHC: 33.5 g/dL (ref 30.0–36.0)
MCV: 87.6 fL (ref 80.0–100.0)
Monocytes Absolute: 0.5 10*3/uL (ref 0.1–1.0)
Monocytes Relative: 11 %
Neutro Abs: 2.2 10*3/uL (ref 1.7–7.7)
Neutrophils Relative %: 57 %
Platelets: 185 10*3/uL (ref 150–400)
RBC: 4.36 MIL/uL (ref 3.87–5.11)
RDW: 13.9 % (ref 11.5–15.5)
WBC: 4 10*3/uL (ref 4.0–10.5)
nRBC: 0 % (ref 0.0–0.2)

## 2022-08-02 LAB — COMPREHENSIVE METABOLIC PANEL
ALT: 24 U/L (ref 0–44)
AST: 27 U/L (ref 15–41)
Albumin: 4.3 g/dL (ref 3.5–5.0)
Alkaline Phosphatase: 55 U/L (ref 38–126)
Anion gap: 10 (ref 5–15)
BUN: 27 mg/dL — ABNORMAL HIGH (ref 8–23)
CO2: 26 mmol/L (ref 22–32)
Calcium: 8.8 mg/dL — ABNORMAL LOW (ref 8.9–10.3)
Chloride: 104 mmol/L (ref 98–111)
Creatinine, Ser: 1.01 mg/dL — ABNORMAL HIGH (ref 0.44–1.00)
GFR, Estimated: 59 mL/min — ABNORMAL LOW (ref >60)
Glucose, Bld: 101 mg/dL — ABNORMAL HIGH (ref 70–99)
Potassium: 4 mmol/L (ref 3.5–5.1)
Sodium: 140 mmol/L (ref 135–145)
Total Bilirubin: 0.5 mg/dL (ref 0.3–1.2)
Total Protein: 7.8 g/dL (ref 6.5–8.1)

## 2022-08-02 LAB — TSH: TSH: 5.949 u[IU]/mL — ABNORMAL HIGH (ref 0.350–4.500)

## 2022-08-02 MED ORDER — HEPARIN SOD (PORK) LOCK FLUSH 100 UNIT/ML IV SOLN
INTRAVENOUS | Status: AC
  Administered 2022-08-02: 500 [IU]
  Filled 2022-08-02: qty 5

## 2022-08-02 MED ORDER — HEPARIN SOD (PORK) LOCK FLUSH 100 UNIT/ML IV SOLN: 500.0000 [IU] | Freq: Once | INTRAVENOUS | Status: DC

## 2022-08-02 MED ORDER — SODIUM CHLORIDE 0.9% FLUSH
10.0000 mL | Freq: Once | INTRAVENOUS | Status: AC
  Administered 2022-08-02: 10 mL

## 2022-08-02 MED ORDER — SODIUM CHLORIDE (PF) 0.9 % IJ SOLN
INTRAMUSCULAR | Status: AC
  Filled 2022-08-02: qty 50

## 2022-08-02 MED ORDER — IOHEXOL 300 MG/ML  SOLN
75.0000 mL | Freq: Once | INTRAMUSCULAR | Status: AC | PRN
  Administered 2022-08-02: 75 mL via INTRAVENOUS

## 2022-08-02 MED ORDER — HYDROCODONE-ACETAMINOPHEN 5-325 MG PO TABS
1.0000 | ORAL_TABLET | Freq: Four times a day (QID) | ORAL | 0 refills | Status: DC | PRN
  Filled 2022-08-02: qty 60, 15d supply, fill #0

## 2022-08-02 NOTE — Telephone Encounter (Signed)
Attempted to call son. No answer and voicemail. Received a message about pain medication for [facility] during lab appt today.

## 2022-08-05 ENCOUNTER — Other Ambulatory Visit (HOSPITAL_COMMUNITY): Payer: Self-pay

## 2022-08-09 ENCOUNTER — Inpatient Hospital Stay: Payer: Medicare HMO | Admitting: Hematology and Oncology

## 2022-08-09 ENCOUNTER — Other Ambulatory Visit (HOSPITAL_COMMUNITY): Payer: Self-pay

## 2022-08-09 ENCOUNTER — Encounter: Payer: Self-pay | Admitting: Hematology and Oncology

## 2022-08-09 VITALS — BP 108/64 | HR 78 | Temp 97.5°F | Resp 18 | Ht 62.0 in | Wt 146.2 lb

## 2022-08-09 DIAGNOSIS — G893 Neoplasm related pain (acute) (chronic): Secondary | ICD-10-CM

## 2022-08-09 DIAGNOSIS — C329 Malignant neoplasm of larynx, unspecified: Secondary | ICD-10-CM | POA: Diagnosis not present

## 2022-08-09 DIAGNOSIS — Z7901 Long term (current) use of anticoagulants: Secondary | ICD-10-CM | POA: Diagnosis not present

## 2022-08-09 DIAGNOSIS — E039 Hypothyroidism, unspecified: Secondary | ICD-10-CM

## 2022-08-09 DIAGNOSIS — C321 Malignant neoplasm of supraglottis: Secondary | ICD-10-CM | POA: Diagnosis not present

## 2022-08-09 DIAGNOSIS — I2510 Atherosclerotic heart disease of native coronary artery without angina pectoris: Secondary | ICD-10-CM

## 2022-08-09 DIAGNOSIS — Z923 Personal history of irradiation: Secondary | ICD-10-CM | POA: Diagnosis not present

## 2022-08-09 DIAGNOSIS — C7800 Secondary malignant neoplasm of unspecified lung: Secondary | ICD-10-CM | POA: Diagnosis not present

## 2022-08-09 DIAGNOSIS — Z79890 Hormone replacement therapy (postmenopausal): Secondary | ICD-10-CM | POA: Diagnosis not present

## 2022-08-09 DIAGNOSIS — L309 Dermatitis, unspecified: Secondary | ICD-10-CM | POA: Diagnosis not present

## 2022-08-09 DIAGNOSIS — Z79899 Other long term (current) drug therapy: Secondary | ICD-10-CM | POA: Diagnosis not present

## 2022-08-09 DIAGNOSIS — Z9221 Personal history of antineoplastic chemotherapy: Secondary | ICD-10-CM | POA: Diagnosis not present

## 2022-08-09 MED ORDER — LEVOTHYROXINE SODIUM 150 MCG PO TABS
150.0000 ug | ORAL_TABLET | Freq: Every day | ORAL | 3 refills | Status: DC
  Filled 2022-08-09: qty 30, 30d supply, fill #0
  Filled 2022-09-06: qty 30, 30d supply, fill #1

## 2022-08-09 NOTE — Assessment & Plan Note (Signed)
Her TSH is elevated I plan to increase the dose of Synthroid

## 2022-08-09 NOTE — Assessment & Plan Note (Signed)
She has stable pain control She will continue the same medications for pain

## 2022-08-09 NOTE — Progress Notes (Signed)
[place] OFFICE PROGRESS NOTE  Patient Care Team: Hamrick, Lorin Mercy, MD as PCP - General (Family Medicine) Heath Lark, MD as Consulting Physician (Hematology and Oncology)  ASSESSMENT & PLAN:  Laryngeal squamous cell carcinoma ([place]) I have reviewed multiple CT imaging with the patient Overall, she has disease stability I recommend repeat imaging study again in 4 months, due around March 2024 I will see her in 2 months with supportive care, port flush and labs monitoring  Cancer associated pain She has stable pain control She will continue the same medications for pain  Hypothyroidism Her TSH is elevated I plan to increase the dose of Synthroid  Eczema She has mild eczema on her skin near her neck I recommend over-the-counter hydrocortisone as well as Benadryl for itching  Coronary artery arteriosclerosis This is seen on CT imaging She has cardiovascular risk factors She is on anticoagulation therapy along with Crestor I recommend consideration for cardiology consult but the patient is comfortable to continue follow-up with her primary care doctor for risk factor modification  No orders of the defined types were placed in this encounter.   All questions were answered. The patient knows to call the clinic with any problems, questions or concerns. The total time spent in the appointment was 30 minutes encounter with patients including review of chart and various tests results, discussions about plan of care and coordination of care plan   Heath Lark, MD 08/09/2022 9:16 AM  INTERVAL HISTORY: Please see below for problem oriented charting. she returns for review of test results We communicated through writing She is doing well Pain is well controlled She noted a skin rash near the left side of her neck which is itchy She denies recent chest pain, shortness of breath or dizziness on exertion Denies recent bleeding complications from anticoagulation  therapy  REVIEW OF SYSTEMS:   Constitutional: Denies fevers, chills or abnormal weight loss Eyes: Denies blurriness of vision Ears, nose, mouth, throat, and face: Denies mucositis or sore throat Respiratory: Denies cough, dyspnea or wheezes Cardiovascular: Denies palpitation, chest discomfort or lower extremity swelling Gastrointestinal:  Denies nausea, heartburn or change in bowel habits Lymphatics: Denies new lymphadenopathy or easy bruising Neurological:Denies numbness, tingling or new weaknesses Behavioral/Psych: Mood is stable, no new changes  All other systems were reviewed with the patient and are negative.  I have reviewed the past medical history, past surgical history, social history and family history with the patient and they are unchanged from previous note.  ALLERGIES:  has no active allergies.  MEDICATIONS:  Current Outpatient Medications  Medication Sig Dispense Refill   levothyroxine (SYNTHROID) 150 MCG tablet Take 1 tablet (150 mcg total) by mouth daily before breakfast. 30 tablet 3   rosuvastatin (CRESTOR) 10 MG tablet Take 10 mg by mouth daily.     apixaban (ELIQUIS) 2.5 MG TABS tablet Take 1 tablet (2.5 mg total) by mouth 2 (two) times daily. 60 tablet 3   Cholecalciferol (VITAMIN D3) 125 MCG (5000 UT) CAPS Take 5,000 Units by mouth daily.     HYDROcodone-acetaminophen (NORCO/VICODIN) 5-325 MG tablet Take 1 tablet by mouth every 6 (six) hours as needed for moderate pain. 60 tablet 0   lidocaine-prilocaine (EMLA) cream Apply topically daily as needed. 30 g 9   Nutritional Supplements (KATE FARMS PEPTIDE 1.5) LIQD Take 4 Bottles by mouth daily. 325 mL 11   oxymetazoline (AFRIN) 0.05 % nasal spray Place 3 sprays into both nostrils 2 (two) times daily as needed (Bleeding from  stoma). 30 mL 0   No current facility-administered medications for this visit.    SUMMARY OF ONCOLOGIC HISTORY: Oncology History Overview Note  Progressed on pembrolizumab   Laryngeal  squamous cell carcinoma ([place])  12/02/2018 Imaging   CT neck: IMPRESSION: 1. Supraglottic laryngeal carcinoma extending across the anterior commissure and invading the prelaryngeal fat, left aryepiglottic fold and left aspect of the epiglottis. 2. Severe narrowing of the laryngeal airway. 3. Bilateral subcentimeter level 2A and 2A cervical lymph nodes.     12/02/2018 Imaging   CT  Chest:  IMPRESSION: No evidence of metastatic disease in the chest.   12/03/2018 Pathology Results   (Care Everywhere; [facility]) A: Larynx, supraglottis, biopsy - Atypical squamous proliferation with at least low grade dysplasia and abundant keratinization - No definite high grade dysplasia or invasive carcinoma represented (see comment)   12/18/2018 Pathology Results   (Care Everywhere; [facility]) A:  Neck, left, fine needle aspiration - Rare single atypical cells, no cells diagnostic of malignancy - Lymph node material present  B:  Neck, right, fine needle aspiration - Non-diagnostic specimen - No lymph node material identified - Mixed inflammation and blood   12/31/2018 Pathology Results   (Care Everywhere; [facility])  A: Larynx, left supraglottic mass, biopsy - Atypical squamous proliferation with abundant keratinization, suspicious for well-differentiated invasive squamous cell carcinoma  B: Larynx, left supraglottic mass, biopsy - Well-differentiated invasive squamous cell carcinoma with abundant keratinization (see comment)   01/18/2019 Initial Diagnosis   Laryngeal cancer ([place])   01/26/2019 Imaging   1. Locally advanced hypermetabolic laryngeal neoplasm centered in the left supraglottic space with bilateral glottic and epiglottic involvement and subglottic extension on the left. 2. Hypermetabolic bilateral level 3 neck nodal metastases. 3. Hypermetabolic high mediastinal nodal metastases between the trachea and upper thoracic esophagus. 4. Otherwise no distant hypermetabolic metastatic  disease. 5. Nodular 7 mm focus at the left lung base, below PET resolution, recommend attention on follow-up chest CT in 3 months. 6. Chronic findings include: Aortic Atherosclerosis (ICD10-I70.0). Cholelithiasis.   02/08/2019 Cancer Staging   Staging form: Larynx - Supraglottis, AJCC 8th Edition - Clinical stage from 02/08/2019: Stage IVA (cT3, cN2c, cM0) - Signed by Eppie Gibson, MD on 02/08/2019   02/11/2019 - 04/07/2019 Chemotherapy   The patient weekly cisplatin for chemotherapy treatment.     07/20/2019 Pathology Results   PROCEDURE: Total glossectomy, total pharyngectomy, total laryngectomy, total thyroidectomy, and cervical esophagectomy TUMOR SITE: Larynx TUMOR LATERALITY: Not specified TUMOR FOCALITY: Unifocal TUMOR SIZE:    GREATEST DIMENSION: 6.9 cm    ADDITIONAL DIMENSIONS: 5.4 x 2.4 cm HISTOLOGIC TYPE: Squamous cell carcinoma, conventional (keratinizing) HISTOLOGIC GRADE:  G1-G2: Well to moderately differentiated TUMOR EXTENSION: Tumor involves the posterior tongue, pharynx, larynx, tracheal cartilage, hyoid bone, cervical esophagus, vagus nerve, carotid artery, deep cervical fascia, ans surrounding skeletal muscle and soft tissue MARGINS: Uninvolved by invasive tumor; Perineural invasion is less than 49mm from the left soft tissue margin LYMPHOVASCULAR INVASION: Not identified PERINEURAL INVASION:  Present REGIONAL LYMPH NODES:     NUMBER OF LYMPH NODES INVOLVED: 2     NUMBER OF LYMPH NODES EXAMINED: 24     LATERALITY OF LYMPH NODES INVOLVED: Cannot determine     SIZE OF LARGEST METASTATIC DEPOSIT: 0.8 cm     EXTRANODAL EXTENSION: Not identified PATHOLOGIC STAGE CLASSIFICATION (pTNM, AJCC 8TH Ed): (r)(y) pT4b pN2a DISTANT METASTASIS (pM): ADDITIONAL PATHOLOGIC FINDINGS: Previous chemoradiation   11/04/2019 Imaging   PET: 1.  Hypermetabolic bilateral neck lesions as  detailed above are concerning for disease recurrence. 2.  Compared to 07/08/2019 PET scan,  interval development of subcentimeter bilateral upper lobe pulmonary nodules, size below PET resolution. These are concerning for metastasis. Consider diagnostic quality CT chest exam for further evaluation as per oncology treatment planning. 3.  Focal hypermetabolic uptake in right ventricular cavity of indeterminate significance. Recommend echocardiographic examination to rule out underlying thrombus. This finding was discussed with Dr. Conley Canal by Dr. Clementeen Graham and Dr. Marcello Moores on 11/04/2019 at approximately 3:00 PM via telephone. 4.  Ancillary CT findings as above.   11/05/2019 Imaging   Echocardiogram: Summary   1. The left ventricle is normal in size with normal wall thickness.   2. The left ventricular systolic function is normal, LVEF is visually estimated at 60-65%.   3. The right ventricle is normal in size, with normal systolic function.   4. There is an extrinsic compression of the RV and RA laterally at the AV groove by a mass of 4.3 cm diameter. Unclear if the mass is invasive, although no pericardial effusion seen suggestive no invasion possibly. There is no evidence of hemodynamic compromise secondary to cardiac compression with normal IVC diameter.   12/06/2019 Pathology Results   A. LYMPH NODE, LEFT SUBMANDIBULAR, BIOPSY:  - Squamous cell carcinoma.  - No distinct nodal tissue identified.    12/06/2019 Procedure   Successful ultrasound-guided core biopsy of centrally necrotic left submandibular lymph node. Of note, due to extensive prior postsurgical and post radiation changes, patient has very limited mobility in the neck and the biopsy was extremely challenging    12/16/2019 -  Chemotherapy   The patient had pembrolizumab for chemotherapy treatment.     03/08/2020 PET scan   1. Diminished size of masses in the neck as described. Persistent increased metabolic activity particularly in the LEFT neck. Some of this diminished size on the LEFT could be due to decompression of  necrotic material. Correlate with any symptoms of inflammation or infection in this area as well given the paucity of residual soft tissue associated with the above abnormality, overall findings are compatible with residual disease in this location and there is no significant surrounding stranding to indicate inflammation that would correlate with the degree of FDG uptake remaining. 2. Diminished activity in the juxta cardiac mass with peripheral activity suggesting interval necrosis, on the prior study this was uniformly hypermetabolic. There is still considerable FDG uptake. 3. No signs of disease in the abdomen or pelvis. 4. 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 Correlate with any new pain or symptoms in this area with further imaging as warranted. No discrete mass or bone destruction on today's study.   06/08/2020 Imaging   CT neck 1. Status post laryngectomy and neck dissection without lymphadenopathy or mass lesion. 2. Short segment severe stenosis of the left internal carotid artery adjacent to a surgical clip.     06/08/2020 Imaging   CT chest 1. There is a large, hypodense lesion within, or very closely abutting and likely intruding upon the right ventricle, as seen on prior examination and approximately 4.6 x 4.3 cm, poorly evaluated due to cardiac motion. Findings are consistent with metastatic lesion and/or thrombus and poorly assessed by ungated CT. Consider echocardiography and cardiac MRI for further assessment.   2. Multiple small pulmonary nodules in the bilateral lung apices are unchanged. Attention on follow-up.   3.  No evidence of new metastatic disease in the chest.   4.  Aortic Atherosclerosis (ICD10-I70.0).  09/07/2020 Imaging   1. Mild decrease in size of juxta cardiac metastatic involving the right ventricle. 2. Small pulmonary nodules are unchanged from previous exam. 3. Aortic atherosclerosis. 4. Gallstone.     12/14/2020 Procedure   Successful  exchange and up size from 20 French pull-through gastrostomy to 22 French balloon retention gastrostomy.   03/30/2021 Imaging   Ct neck 1.  In comparison with PET/CT from February 2021, marked interval decrease in size of soft tissue mass along the angle of the left mandible consistent with a positive treatment response. However, there are new destructive bony changes along the lingual surface of the mandible in this region with appearance of soft tissue infiltrating the marrow cavity. While concerning for neoplastic involvement, changes could also represent other nonspecific osteonecrosis including infection or sequela of prior radiation treatment. Recommend correlation with direct visual inspection. Further evaluation with MRI may be of value.  2.  Centrally necrotic 1.3 cm right level IIa lymph node corresponding with previously described hypermetabolic lesion, decreased in size from prior. No suspicious new or enlarged lymph nodes in the neck.  3.  Enlarged right lower paratracheal lymph node suspected in the mediastinum, incompletely imaged. Consider dedicated CT imaging of the chest.  4.  Scattered pulmonary nodules in the imaged lung apices, the largest a 9 mm lesion in the right upper lobe. Recommend comparison with outside imaging ofthe chest if available.   05/03/2021 Echocardiogram    1. Left ventricular ejection fraction, by estimation, is 55 to 60%. The left ventricle has normal function. The left ventricle has no regional wall motion abnormalities. There is mild left ventricular hypertrophy. Left ventricular diastolic parameters are consistent with Grade I diastolic dysfunction (impaired relaxation).  2. Prominent echogenic structure on the RV free wall, possibly attached to the TV - measuring about 2.0 cm in diameter and 3.0 cm in length - some mobile filamentous material is attached, suggestive of thrombus or less likely tumor.. Right ventricular systolic function is normal. The right  ventricular size is normal.  3. The mitral valve is grossly normal. Trivial mitral valve regurgitation.  4. The aortic valve is tricuspid. Aortic valve regurgitation is not visualized.     05/04/2021 Imaging   RIGHT: - There is no evidence of deep vein thrombosis in the lower extremity.   - No cystic structure found in the popliteal fossa.   LEFT: - There is no evidence of deep vein thrombosis in the lower extremity.   - No cystic structure found in the popliteal fossa.     07/12/2021 Imaging   1. A large, heterogeneous mass about and within the right ventricle is difficult to clearly assess due to cardiac motion and mixing artifact on this non tailored examination, but suspect it has significantly increased in size, and now involves the right atrium in addition to the right ventricle and pulmonary outflow tract, measuring approximately 6.9 x 4.9 cm, previously 4.5 x 4.2 cm when measured similarly. This may reflect a combination of metastatic soft tissue and thrombus, and could be more clearly assessed by echocardiography and cardiac MRI. 2. No significant change of a right upper lobe pulmonary nodule, measuring 0.8 cm, which remains modestly suspicious for a metastasis. Other small nodules are likewise stable. Attention on follow-up. 3. Status post laryngectomy and tracheostomy. 4. Coronary artery disease.   07/24/2021 Echocardiogram    1. There is an echogenic RV and RVOT mass. Attachment appears to be on the right ventricular mid wall. Mass is 32 by 24 mm,  though may be underestimated. Right ventricular systolic function is normal. The right ventricular size is normal. There is normal pulmonary artery systolic pressure.  2. Left ventricular ejection fraction, by estimation, is 60 to 65%. The left ventricle has normal function. The left ventricle has no regional wall motion abnormalities. Left ventricular diastolic parameters are consistent with Grade I diastolic dysfunction (impaired  relaxation). The average left ventricular global longitudinal strain is -19.0 %. The global longitudinal strain is normal.  3. The mitral valve is normal in structure. No evidence of mitral valve regurgitation.  4. The aortic valve is tricuspid. Aortic valve regurgitation is not visualized. No aortic stenosis is present.   Comparison(s): A prior study was performed on 05/03/21. Prior images reviewed side by side. Similar size of RV mass to slight increase. Do not see right atrial involvement in this study.   Conclusion(s)/Recommendation(s): If concerned for tumor vs thombus, cardiac MRI could be considered   08/02/2021 Imaging   1. Large RV mass measuring 95mm x 66mm x 75mm. Mass is isointense to myocardium on T1 weighted imaging, does not suppress with fat saturation, isointense on T2 weighted imaging, perfuses on first pass perfusion, and heterogenous enhancement on LGE imaging. This is consistent with tumor, and in setting of known metastatic laryngeal cancer, is consistent with metastasis. There does appear to be thrombus adherent to tumor   2. Normal LV size and systolic function (EF 28%). No late gadolinium enhancement to suggest myocardial scar   3.  Normal RV size with mild systolic dysfunction (EF 41%)   08/17/2021 - 01/18/2022 Chemotherapy   Patient is on Treatment Plan : HEAD/NECK Cisplatin q7d     10/31/2021 Imaging   1. RV mass measures 41mm x 60mm x 46mm, similar to prior cardiac MRI on 08/01/21. As described on previous MRI, mass is isointense to myocardium on T1 and T2 weighted imaging, with heterogenous enhancement on LGE imaging. This is consistent with tumor, and in setting of known metastatic laryngeal cancer, is consistent with metastasis.   2. Normal LV size and systolic function (EF 32%). No LGE to suggest myocardial scar   3.  Normal RV size with low normal systolic function (EF 44%)   01/28/2022 Imaging   RV mass concerning for malignancy   Similar in size from 10/29/21.    Normal RV function in this study.   04/30/2022 Imaging   1. Heterogeneous mass of the right ventricle is decreased in size when compared with most recent prior CT. 2. Stable mildly enlarged right lower paratracheal lymph node. 3. Stable solid pulmonary nodule of the right upper lobe measuring 8 mm. 4.  Aortic Atherosclerosis (ICD10-I70.0).   08/05/2022 Imaging   1. Stable examination demonstrating postoperative changes of laryngectomy and tracheostomy, with grossly stable size of treated metastatic lesion in the right ventricle of the heart. 2. Borderline enlarged low right paratracheal lymph node, stable compared to the prior study. 3. Small right upper lobe pulmonary nodule, stable compared to prior examination, favored to be benign. 4. Aortic atherosclerosis, in addition to left main and 2 vessel coronary artery disease. Assessment for potential risk factor modification, dietary therapy or pharmacologic therapy may be warranted, if clinically indicated.      PHYSICAL EXAMINATION: ECOG PERFORMANCE STATUS: 1 - Symptomatic but completely ambulatory  Vitals:   08/09/22 0814  BP: 108/64  Pulse: 78  Resp: 18  Temp: (!) 97.5 F (36.4 C)  SpO2: 100%   Filed Weights   08/09/22 0814  Weight: 146 lb 3.2  oz (66.3 kg)    GENERAL:alert, no distress and comfortable SKIN: Noted mild skin rash near the base of her left side of the neck  LABORATORY DATA:  I have reviewed the data as listed    Component Value Date/Time   NA 140 08/02/2022 0800   K 4.0 08/02/2022 0800   CL 104 08/02/2022 0800   CO2 26 08/02/2022 0800   GLUCOSE 101 (H) 08/02/2022 0800   BUN 27 (H) 08/02/2022 0800   CREATININE 1.01 (H) 08/02/2022 0800   CREATININE 0.96 02/01/2022 0838   CALCIUM 8.8 (L) 08/02/2022 0800   PROT 7.8 08/02/2022 0800   ALBUMIN 4.3 08/02/2022 0800   AST 27 08/02/2022 0800   AST 19 08/14/2021 0905   ALT 24 08/02/2022 0800   ALT 16 08/14/2021 0905   ALKPHOS 55 08/02/2022 0800   BILITOT  0.5 08/02/2022 0800   BILITOT 0.3 08/14/2021 0905   GFRNONAA 59 (L) 08/02/2022 0800   GFRNONAA >60 02/01/2022 0838   GFRAA >60 06/08/2020 1303   GFRAA >60 02/17/2020 1128    No results found for: "SPEP", "UPEP"  Lab Results  Component Value Date   WBC 4.0 08/02/2022   NEUTROABS 2.2 08/02/2022   HGB 12.8 08/02/2022   HCT 38.2 08/02/2022   MCV 87.6 08/02/2022   PLT 185 08/02/2022      Chemistry      Component Value Date/Time   NA 140 08/02/2022 0800   K 4.0 08/02/2022 0800   CL 104 08/02/2022 0800   CO2 26 08/02/2022 0800   BUN 27 (H) 08/02/2022 0800   CREATININE 1.01 (H) 08/02/2022 0800   CREATININE 0.96 02/01/2022 0838      Component Value Date/Time   CALCIUM 8.8 (L) 08/02/2022 0800   ALKPHOS 55 08/02/2022 0800   AST 27 08/02/2022 0800   AST 19 08/14/2021 0905   ALT 24 08/02/2022 0800   ALT 16 08/14/2021 0905   BILITOT 0.5 08/02/2022 0800   BILITOT 0.3 08/14/2021 3500       RADIOGRAPHIC STUDIES: I have reviewed multiple imaging studies with the patient I have personally reviewed the radiological images as listed and agreed with the findings in the report. CT CHEST W CONTRAST  Result Date: 08/05/2022 CLINICAL DATA:  71 year old female with history of head and neck cancer. * Tracking Code: BO * EXAM: CT CHEST WITH CONTRAST TECHNIQUE: Multidetector CT imaging of the chest was performed during intravenous contrast administration. RADIATION DOSE REDUCTION: This exam was performed according to the departmental dose-optimization program which includes automated exposure control, adjustment of the mA and/or kV according to patient size and/or use of iterative reconstruction technique. CONTRAST:  46mL OMNIPAQUE IOHEXOL 300 MG/ML  SOLN COMPARISON:  Chest CT 04/30/2022. FINDINGS: Cardiovascular: Poorly defined infiltrative partially calcified mass in the right ventricle appears grossly similar to the most recent prior examination, estimated to measure approximately 6.9 x 3.8 cm  on today's study (differences in reported measurement likely reflect differing measuring techniques and position of the lesion at the time of imaging rather than true interval change in size). Overall, heart size is normal. There is no significant pericardial fluid, thickening or pericardial calcification. There is aortic atherosclerosis, as well as atherosclerosis of the great vessels of the mediastinum and the coronary arteries, including calcified atherosclerotic plaque in the left main, left anterior descending and right coronary arteries. Right internal jugular single-lumen porta cath with tip terminating in the right atrium. Mediastinum/Nodes: Status post laryngectomy with tracheostomy tube in position. Prominent low right  paratracheal lymph node measuring 1.2 cm in short axis (axial image 51 of series 4), similar to the prior examination. No definite pathologically enlarged mediastinal or hilar lymph nodes are noted. Esophagus is unremarkable in appearance. No axillary lymphadenopathy. Lungs/Pleura: Mild ground-glass attenuation, septal thickening and regional architectural distortion in the apices of both lungs, similar to prior studies, most compatible with areas of mild postradiation fibrosis. Ill-defined right upper lobe pulmonary nodule (axial image 34 of series 7) measuring 7 x 3 mm (mean diameter 5 mm), stable compared to the prior study. No other suspicious appearing pulmonary nodules or masses are noted. No acute consolidative airspace disease. No pleural effusions. Upper Abdomen: Aortic atherosclerosis. Musculoskeletal: There are no aggressive appearing lytic or blastic lesions noted in the visualized portions of the skeleton. IMPRESSION: 1. Stable examination demonstrating postoperative changes of laryngectomy and tracheostomy, with grossly stable size of treated metastatic lesion in the right ventricle of the heart. 2. Borderline enlarged low right paratracheal lymph node, stable compared to the  prior study. 3. Small right upper lobe pulmonary nodule, stable compared to prior examination, favored to be benign. 4. Aortic atherosclerosis, in addition to left main and 2 vessel coronary artery disease. Assessment for potential risk factor modification, dietary therapy or pharmacologic therapy may be warranted, if clinically indicated. Aortic Atherosclerosis (ICD10-I70.0). Electronically Signed   By: Vinnie Langton M.D.   On: 08/05/2022 08:37

## 2022-08-09 NOTE — Assessment & Plan Note (Signed)
I have reviewed multiple CT imaging with the patient Overall, she has disease stability I recommend repeat imaging study again in 4 months, due around March 2024 I will see her in 2 months with supportive care, port flush and labs monitoring

## 2022-08-09 NOTE — Assessment & Plan Note (Signed)
She has mild eczema on her skin near her neck I recommend over-the-counter hydrocortisone as well as Benadryl for itching

## 2022-08-09 NOTE — Assessment & Plan Note (Signed)
This is seen on CT imaging She has cardiovascular risk factors She is on anticoagulation therapy along with Crestor I recommend consideration for cardiology consult but the patient is comfortable to continue follow-up with her primary care doctor for risk factor modification

## 2022-08-20 ENCOUNTER — Other Ambulatory Visit (HOSPITAL_COMMUNITY): Payer: Self-pay

## 2022-08-20 ENCOUNTER — Other Ambulatory Visit: Payer: Self-pay | Admitting: Hematology and Oncology

## 2022-08-20 DIAGNOSIS — C321 Malignant neoplasm of supraglottis: Secondary | ICD-10-CM

## 2022-08-20 MED ORDER — HYDROCODONE-ACETAMINOPHEN 5-325 MG PO TABS
1.0000 | ORAL_TABLET | Freq: Four times a day (QID) | ORAL | 0 refills | Status: DC | PRN
  Filled 2022-08-20: qty 60, 15d supply, fill #0

## 2022-08-21 ENCOUNTER — Other Ambulatory Visit (HOSPITAL_COMMUNITY): Payer: Self-pay

## 2022-09-04 ENCOUNTER — Other Ambulatory Visit (HOSPITAL_COMMUNITY): Payer: Self-pay

## 2022-09-06 ENCOUNTER — Other Ambulatory Visit: Payer: Self-pay | Admitting: Hematology and Oncology

## 2022-09-06 ENCOUNTER — Other Ambulatory Visit: Payer: Self-pay

## 2022-09-06 ENCOUNTER — Other Ambulatory Visit (HOSPITAL_COMMUNITY): Payer: Self-pay

## 2022-09-06 DIAGNOSIS — C321 Malignant neoplasm of supraglottis: Secondary | ICD-10-CM

## 2022-09-06 MED ORDER — HYDROCODONE-ACETAMINOPHEN 5-325 MG PO TABS
1.0000 | ORAL_TABLET | Freq: Four times a day (QID) | ORAL | 0 refills | Status: DC | PRN
  Filled 2022-09-06: qty 60, 15d supply, fill #0

## 2022-09-18 DIAGNOSIS — C321 Malignant neoplasm of supraglottis: Secondary | ICD-10-CM | POA: Diagnosis not present

## 2022-09-18 DIAGNOSIS — R633 Feeding difficulties, unspecified: Secondary | ICD-10-CM | POA: Diagnosis not present

## 2022-09-19 ENCOUNTER — Other Ambulatory Visit (HOSPITAL_COMMUNITY): Payer: Self-pay

## 2022-09-19 ENCOUNTER — Other Ambulatory Visit: Payer: Self-pay

## 2022-09-25 DIAGNOSIS — E892 Postprocedural hypoparathyroidism: Secondary | ICD-10-CM | POA: Diagnosis not present

## 2022-09-25 DIAGNOSIS — C329 Malignant neoplasm of larynx, unspecified: Secondary | ICD-10-CM | POA: Diagnosis not present

## 2022-09-25 DIAGNOSIS — E039 Hypothyroidism, unspecified: Secondary | ICD-10-CM | POA: Diagnosis not present

## 2022-09-25 DIAGNOSIS — E782 Mixed hyperlipidemia: Secondary | ICD-10-CM | POA: Diagnosis not present

## 2022-09-25 DIAGNOSIS — C7989 Secondary malignant neoplasm of other specified sites: Secondary | ICD-10-CM | POA: Diagnosis not present

## 2022-09-25 DIAGNOSIS — I700 Atherosclerosis of aorta: Secondary | ICD-10-CM | POA: Diagnosis not present

## 2022-09-25 DIAGNOSIS — Z930 Tracheostomy status: Secondary | ICD-10-CM | POA: Diagnosis not present

## 2022-09-30 DIAGNOSIS — C329 Malignant neoplasm of larynx, unspecified: Secondary | ICD-10-CM | POA: Diagnosis not present

## 2022-09-30 DIAGNOSIS — R1314 Dysphagia, pharyngoesophageal phase: Secondary | ICD-10-CM | POA: Diagnosis not present

## 2022-10-04 ENCOUNTER — Other Ambulatory Visit: Payer: Self-pay

## 2022-10-04 ENCOUNTER — Telehealth: Payer: Self-pay

## 2022-10-04 ENCOUNTER — Inpatient Hospital Stay: Payer: Medicare HMO | Attending: Hematology

## 2022-10-04 ENCOUNTER — Encounter: Payer: Self-pay | Admitting: Hematology and Oncology

## 2022-10-04 ENCOUNTER — Other Ambulatory Visit (HOSPITAL_COMMUNITY): Payer: Self-pay

## 2022-10-04 ENCOUNTER — Other Ambulatory Visit: Payer: Self-pay | Admitting: Hematology and Oncology

## 2022-10-04 ENCOUNTER — Inpatient Hospital Stay (HOSPITAL_BASED_OUTPATIENT_CLINIC_OR_DEPARTMENT_OTHER): Payer: Medicare HMO | Admitting: Hematology and Oncology

## 2022-10-04 VITALS — BP 130/82 | HR 95 | Temp 97.7°F | Resp 18 | Ht 62.0 in | Wt 135.6 lb

## 2022-10-04 DIAGNOSIS — E039 Hypothyroidism, unspecified: Secondary | ICD-10-CM

## 2022-10-04 DIAGNOSIS — Z9221 Personal history of antineoplastic chemotherapy: Secondary | ICD-10-CM | POA: Insufficient documentation

## 2022-10-04 DIAGNOSIS — Z7901 Long term (current) use of anticoagulants: Secondary | ICD-10-CM | POA: Diagnosis not present

## 2022-10-04 DIAGNOSIS — C329 Malignant neoplasm of larynx, unspecified: Secondary | ICD-10-CM

## 2022-10-04 DIAGNOSIS — C321 Malignant neoplasm of supraglottis: Secondary | ICD-10-CM | POA: Diagnosis not present

## 2022-10-04 DIAGNOSIS — Z923 Personal history of irradiation: Secondary | ICD-10-CM | POA: Insufficient documentation

## 2022-10-04 DIAGNOSIS — G893 Neoplasm related pain (acute) (chronic): Secondary | ICD-10-CM | POA: Diagnosis not present

## 2022-10-04 DIAGNOSIS — Z95828 Presence of other vascular implants and grafts: Secondary | ICD-10-CM

## 2022-10-04 DIAGNOSIS — I513 Intracardiac thrombosis, not elsewhere classified: Secondary | ICD-10-CM | POA: Diagnosis not present

## 2022-10-04 DIAGNOSIS — I2510 Atherosclerotic heart disease of native coronary artery without angina pectoris: Secondary | ICD-10-CM | POA: Diagnosis not present

## 2022-10-04 DIAGNOSIS — Z79890 Hormone replacement therapy (postmenopausal): Secondary | ICD-10-CM | POA: Diagnosis not present

## 2022-10-04 DIAGNOSIS — L309 Dermatitis, unspecified: Secondary | ICD-10-CM | POA: Insufficient documentation

## 2022-10-04 DIAGNOSIS — Z79899 Other long term (current) drug therapy: Secondary | ICD-10-CM | POA: Diagnosis not present

## 2022-10-04 DIAGNOSIS — C7800 Secondary malignant neoplasm of unspecified lung: Secondary | ICD-10-CM | POA: Diagnosis not present

## 2022-10-04 LAB — COMPREHENSIVE METABOLIC PANEL
ALT: 25 U/L (ref 0–44)
AST: 26 U/L (ref 15–41)
Albumin: 4 g/dL (ref 3.5–5.0)
Alkaline Phosphatase: 54 U/L (ref 38–126)
Anion gap: 7 (ref 5–15)
BUN: 30 mg/dL — ABNORMAL HIGH (ref 8–23)
CO2: 27 mmol/L (ref 22–32)
Calcium: 9.1 mg/dL (ref 8.9–10.3)
Chloride: 105 mmol/L (ref 98–111)
Creatinine, Ser: 0.88 mg/dL (ref 0.44–1.00)
GFR, Estimated: >60 mL/min (ref >60)
Glucose, Bld: 109 mg/dL — ABNORMAL HIGH (ref 70–99)
Potassium: 4.3 mmol/L (ref 3.5–5.1)
Sodium: 139 mmol/L (ref 135–145)
Total Bilirubin: 0.4 mg/dL (ref 0.3–1.2)
Total Protein: 7.1 g/dL (ref 6.5–8.1)

## 2022-10-04 LAB — CBC WITH DIFFERENTIAL/PLATELET
Abs Immature Granulocytes: 0.01 10*3/uL (ref 0.00–0.07)
Basophils Absolute: 0 10*3/uL (ref 0.0–0.1)
Basophils Relative: 0 %
Eosinophils Absolute: 0.2 10*3/uL (ref 0.0–0.5)
Eosinophils Relative: 4 %
HCT: 37.4 % (ref 36.0–46.0)
Hemoglobin: 12.7 g/dL (ref 12.0–15.0)
Immature Granulocytes: 0 %
Lymphocytes Relative: 28 %
Lymphs Abs: 1.1 10*3/uL (ref 0.7–4.0)
MCH: 29.8 pg (ref 26.0–34.0)
MCHC: 34 g/dL (ref 30.0–36.0)
MCV: 87.8 fL (ref 80.0–100.0)
Monocytes Absolute: 0.4 10*3/uL (ref 0.1–1.0)
Monocytes Relative: 10 %
Neutro Abs: 2.2 10*3/uL (ref 1.7–7.7)
Neutrophils Relative %: 58 %
Platelets: 188 10*3/uL (ref 150–400)
RBC: 4.26 MIL/uL (ref 3.87–5.11)
RDW: 13.3 % (ref 11.5–15.5)
WBC: 3.9 10*3/uL — ABNORMAL LOW (ref 4.0–10.5)
nRBC: 0 % (ref 0.0–0.2)

## 2022-10-04 LAB — TSH: TSH: 0.285 u[IU]/mL — ABNORMAL LOW (ref 0.350–4.500)

## 2022-10-04 MED ORDER — LEVOTHYROXINE SODIUM 125 MCG PO TABS
125.0000 ug | ORAL_TABLET | Freq: Every day | ORAL | 3 refills | Status: DC
  Filled 2022-10-04: qty 30, 30d supply, fill #0

## 2022-10-04 MED ORDER — SODIUM CHLORIDE 0.9% FLUSH
10.0000 mL | Freq: Once | INTRAVENOUS | Status: AC
  Administered 2022-10-04: 10 mL

## 2022-10-04 MED ORDER — HYDROCODONE-ACETAMINOPHEN 5-325 MG PO TABS
1.0000 | ORAL_TABLET | Freq: Four times a day (QID) | ORAL | 0 refills | Status: DC | PRN
  Filled 2022-10-04: qty 60, 15d supply, fill #0

## 2022-10-04 MED ORDER — HEPARIN SOD (PORK) LOCK FLUSH 100 UNIT/ML IV SOLN
500.0000 [IU] | Freq: Once | INTRAVENOUS | Status: AC
  Administered 2022-10-04: 500 [IU]

## 2022-10-04 NOTE — Assessment & Plan Note (Signed)
Her last CT imaging from showed disease stability She is not symptomatic I recommend repeat imaging study again in 4 months, due around March 2024

## 2022-10-04 NOTE — Assessment & Plan Note (Signed)
She will continue anticoagulation therapy to reduce risk of embolic phenomena

## 2022-10-04 NOTE — Assessment & Plan Note (Signed)
She has stable pain control She will continue the same medications for pain

## 2022-10-04 NOTE — Telephone Encounter (Signed)
Patient's son called and informed of patient's recent lab results. Patient's synthroid dose will be reduced and new prescription has been sent in to [facility].  Son verbalized an understanding of the information.

## 2022-10-04 NOTE — Telephone Encounter (Signed)
-----  Message from Artis Delay, MD sent at 10/04/2022  1:55 PM EST ----- Regarding: TSH Pls call her son She cannot speak Her TSH is low so her synthroid dose needs to be reduced I sent the new prescription refill to [facility], changed from 150 mcg to 125 mcg

## 2022-10-04 NOTE — Progress Notes (Signed)
[facility] OFFICE PROGRESS NOTE  Patient Care Team: Hamrick, Durward Fortes, MD as PCP - General (Family Medicine) Artis Delay, MD as Consulting Physician (Hematology and Oncology)  ASSESSMENT & PLAN:  Laryngeal squamous cell carcinoma [facility]) Her last CT imaging from showed disease stability She is not symptomatic I recommend repeat imaging study again in 4 months, due around March 2024   Cancer associated pain She has stable pain control She will continue the same medications for pain  Hypothyroidism Her TSH is intermittently elevated I will adjust the dose of Synthroid as needed  RV (right ventricular) mural thrombus She will continue anticoagulation therapy to reduce risk of embolic phenomena  Orders Placed This Encounter  Procedures   CT CHEST W CONTRAST    Standing Status:   Future    Standing Expiration Date:   10/05/2023    Order Specific Question:   If indicated for the ordered procedure, I authorize the administration of contrast media per Radiology protocol    Answer:   Yes    Order Specific Question:   Preferred imaging location?    Answer:   [facility]    Order Specific Question:   Radiology Contrast Protocol - do NOT remove file path    Answer:   [email or web address]    All questions were answered. The patient knows to call the clinic with any problems, questions or concerns. The total time spent in the appointment was 20 minutes encounter with patients including review of chart and various tests results, discussions about plan of care and coordination of care plan   Artis Delay, MD 10/04/2022 9:34 AM  INTERVAL HISTORY: Please see below for problem oriented charting. she returns for surveillance follow-up for metastatic laryngeal cancer to the right ventricle She is doing well since last time I saw her Denies bleeding complication from anticoagulation therapy Her chronic throat pain is stable  REVIEW OF  SYSTEMS:   Constitutional: Denies fevers, chills or abnormal weight loss Eyes: Denies blurriness of vision Ears, nose, mouth, throat, and face: Denies mucositis or sore throat Respiratory: Denies cough, dyspnea or wheezes Cardiovascular: Denies palpitation, chest discomfort or lower extremity swelling Gastrointestinal:  Denies nausea, heartburn or change in bowel habits Skin: Denies abnormal skin rashes Lymphatics: Denies new lymphadenopathy or easy bruising Neurological:Denies numbness, tingling or new weaknesses Behavioral/Psych: Mood is stable, no new changes  All other systems were reviewed with the patient and are negative.  I have reviewed the past medical history, past surgical history, social history and family history with the patient and they are unchanged from previous note.  ALLERGIES:  has no active allergies.  MEDICATIONS:  Current Outpatient Medications  Medication Sig Dispense Refill   apixaban (ELIQUIS) 2.5 MG TABS tablet Take 1 tablet (2.5 mg total) by mouth 2 (two) times daily. 60 tablet 3   Cholecalciferol (VITAMIN D3) 125 MCG (5000 UT) CAPS Take 5,000 Units by mouth daily.     HYDROcodone-acetaminophen (NORCO/VICODIN) 5-325 MG tablet Take 1 tablet by mouth every 6 (six) hours as needed for moderate pain. 60 tablet 0   levothyroxine (SYNTHROID) 150 MCG tablet Take 1 tablet (150 mcg total) by mouth daily before breakfast. 30 tablet 3   lidocaine-prilocaine (EMLA) cream Apply topically daily as needed. 30 g 9   Nutritional Supplements (KATE FARMS PEPTIDE 1.5) LIQD Take 4 Bottles by mouth daily. 325 mL 11   oxymetazoline (AFRIN) 0.05 % nasal spray Place 3 sprays into both nostrils 2 (two) times daily as needed (Bleeding  from stoma). 30 mL 0   rosuvastatin (CRESTOR) 10 MG tablet Take 10 mg by mouth daily.     No current facility-administered medications for this visit.    SUMMARY OF ONCOLOGIC HISTORY: Oncology History Overview Note  Progressed on pembrolizumab    Laryngeal squamous cell carcinoma (HCC)  12/02/2018 Imaging   CT neck: IMPRESSION: 1. Supraglottic laryngeal carcinoma extending across the anterior commissure and invading the prelaryngeal fat, left aryepiglottic fold and left aspect of the epiglottis. 2. Severe narrowing of the laryngeal airway. 3. Bilateral subcentimeter level 2A and 2A cervical lymph nodes.     12/02/2018 Imaging   CT  Chest:  IMPRESSION: No evidence of metastatic disease in the chest.   12/03/2018 Pathology Results   (Care Everywhere; [facility]) A: Larynx, supraglottis, biopsy - Atypical squamous proliferation with at least low grade dysplasia and abundant keratinization - No definite high grade dysplasia or invasive carcinoma represented (see comment)   12/18/2018 Pathology Results   (Care Everywhere; [facility]) A:  Neck, left, fine needle aspiration - Rare single atypical cells, no cells diagnostic of malignancy - Lymph node material present  B:  Neck, right, fine needle aspiration - Non-diagnostic specimen - No lymph node material identified - Mixed inflammation and blood   12/31/2018 Pathology Results   (Care Everywhere; [facility])  A: Larynx, left supraglottic mass, biopsy - Atypical squamous proliferation with abundant keratinization, suspicious for well-differentiated invasive squamous cell carcinoma  B: Larynx, left supraglottic mass, biopsy - Well-differentiated invasive squamous cell carcinoma with abundant keratinization (see comment)   01/18/2019 Initial Diagnosis   Laryngeal cancer (HCC)   01/26/2019 Imaging   1. Locally advanced hypermetabolic laryngeal neoplasm centered in the left supraglottic space with bilateral glottic and epiglottic involvement and subglottic extension on the left. 2. Hypermetabolic bilateral level 3 neck nodal metastases. 3. Hypermetabolic high mediastinal nodal metastases between the trachea and upper thoracic esophagus. 4. Otherwise no distant hypermetabolic  metastatic disease. 5. Nodular 7 mm focus at the left lung base, below PET resolution, recommend attention on follow-up chest CT in 3 months. 6. Chronic findings include: Aortic Atherosclerosis (ICD10-I70.0). Cholelithiasis.   02/08/2019 Cancer Staging   Staging form: Larynx - Supraglottis, AJCC 8th Edition - Clinical stage from 02/08/2019: Stage IVA (cT3, cN2c, cM0) - Signed by Lonie Peak, MD on 02/08/2019   02/11/2019 - 04/07/2019 Chemotherapy   The patient weekly cisplatin for chemotherapy treatment.     07/20/2019 Pathology Results   PROCEDURE: Total glossectomy, total pharyngectomy, total laryngectomy, total thyroidectomy, and cervical esophagectomy TUMOR SITE: Larynx TUMOR LATERALITY: Not specified TUMOR FOCALITY: Unifocal TUMOR SIZE:    GREATEST DIMENSION: 6.9 cm    ADDITIONAL DIMENSIONS: 5.4 x 2.4 cm HISTOLOGIC TYPE: Squamous cell carcinoma, conventional (keratinizing) HISTOLOGIC GRADE:  G1-G2: Well to moderately differentiated TUMOR EXTENSION: Tumor involves the posterior tongue, pharynx, larynx, tracheal cartilage, hyoid bone, cervical esophagus, vagus nerve, carotid artery, deep cervical fascia, ans surrounding skeletal muscle and soft tissue MARGINS: Uninvolved by invasive tumor; Perineural invasion is less than 42mm from the left soft tissue margin LYMPHOVASCULAR INVASION: Not identified PERINEURAL INVASION:  Present REGIONAL LYMPH NODES:     NUMBER OF LYMPH NODES INVOLVED: 2     NUMBER OF LYMPH NODES EXAMINED: 24     LATERALITY OF LYMPH NODES INVOLVED: Cannot determine     SIZE OF LARGEST METASTATIC DEPOSIT: 0.8 cm     EXTRANODAL EXTENSION: Not identified PATHOLOGIC STAGE CLASSIFICATION (pTNM, AJCC 8TH Ed): (r)(y) pT4b pN2a DISTANT METASTASIS (pM): ADDITIONAL PATHOLOGIC FINDINGS:  Previous chemoradiation   11/04/2019 Imaging   PET: 1.  Hypermetabolic bilateral neck lesions as detailed above are concerning for disease recurrence. 2.  Compared to 07/08/2019 PET  scan, interval development of subcentimeter bilateral upper lobe pulmonary nodules, size below PET resolution. These are concerning for metastasis. Consider diagnostic quality CT chest exam for further evaluation as per oncology treatment planning. 3.  Focal hypermetabolic uptake in right ventricular cavity of indeterminate significance. Recommend echocardiographic examination to rule out underlying thrombus. This finding was discussed with Dr. Lendell Caprice by Dr. Ahmed Prima and Dr. Maisie Fus on 11/04/2019 at approximately 3:00 PM via telephone. 4.  Ancillary CT findings as above.   11/05/2019 Imaging   Echocardiogram: Summary   1. The left ventricle is normal in size with normal wall thickness.   2. The left ventricular systolic function is normal, LVEF is visually estimated at 60-65%.   3. The right ventricle is normal in size, with normal systolic function.   4. There is an extrinsic compression of the RV and RA laterally at the AV groove by a mass of 4.3 cm diameter. Unclear if the mass is invasive, although no pericardial effusion seen suggestive no invasion possibly. There is no evidence of hemodynamic compromise secondary to cardiac compression with normal IVC diameter.   12/06/2019 Pathology Results   A. LYMPH NODE, LEFT SUBMANDIBULAR, BIOPSY:  - Squamous cell carcinoma.  - No distinct nodal tissue identified.    12/06/2019 Procedure   Successful ultrasound-guided core biopsy of centrally necrotic left submandibular lymph node. Of note, due to extensive prior postsurgical and post radiation changes, patient has very limited mobility in the neck and the biopsy was extremely challenging    12/16/2019 -  Chemotherapy   The patient had pembrolizumab for chemotherapy treatment.     03/08/2020 PET scan   1. Diminished size of masses in the neck as described. Persistent increased metabolic activity particularly in the LEFT neck. Some of this diminished size on the LEFT could be due to decompression of  necrotic material. Correlate with any symptoms of inflammation or infection in this area as well given the paucity of residual soft tissue associated with the above abnormality, overall findings are compatible with residual disease in this location and there is no significant surrounding stranding to indicate inflammation that would correlate with the degree of FDG uptake remaining. 2. Diminished activity in the juxta cardiac mass with peripheral activity suggesting interval necrosis, on the prior study this was uniformly hypermetabolic. There is still considerable FDG uptake. 3. No signs of disease in the abdomen or pelvis. 4. 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 Correlate with any new pain or symptoms in this area with further imaging as warranted. No discrete mass or bone destruction on today's study.   06/08/2020 Imaging   CT neck 1. Status post laryngectomy and neck dissection without lymphadenopathy or mass lesion. 2. Short segment severe stenosis of the left internal carotid artery adjacent to a surgical clip.     06/08/2020 Imaging   CT chest 1. There is a large, hypodense lesion within, or very closely abutting and likely intruding upon the right ventricle, as seen on prior examination and approximately 4.6 x 4.3 cm, poorly evaluated due to cardiac motion. Findings are consistent with metastatic lesion and/or thrombus and poorly assessed by ungated CT. Consider echocardiography and cardiac MRI for further assessment.   2. Multiple small pulmonary nodules in the bilateral lung apices are unchanged. Attention on follow-up.   3.  No evidence of new  metastatic disease in the chest.   4.  Aortic Atherosclerosis (ICD10-I70.0).     09/07/2020 Imaging   1. Mild decrease in size of juxta cardiac metastatic involving the right ventricle. 2. Small pulmonary nodules are unchanged from previous exam. 3. Aortic atherosclerosis. 4. Gallstone.     12/14/2020 Procedure   Successful  exchange and up size from 20 French pull-through gastrostomy to 22 French balloon retention gastrostomy.   03/30/2021 Imaging   Ct neck 1.  In comparison with PET/CT from February 2021, marked interval decrease in size of soft tissue mass along the angle of the left mandible consistent with a positive treatment response. However, there are new destructive bony changes along the lingual surface of the mandible in this region with appearance of soft tissue infiltrating the marrow cavity. While concerning for neoplastic involvement, changes could also represent other nonspecific osteonecrosis including infection or sequela of prior radiation treatment. Recommend correlation with direct visual inspection. Further evaluation with MRI may be of value.  2.  Centrally necrotic 1.3 cm right level IIa lymph node corresponding with previously described hypermetabolic lesion, decreased in size from prior. No suspicious new or enlarged lymph nodes in the neck.  3.  Enlarged right lower paratracheal lymph node suspected in the mediastinum, incompletely imaged. Consider dedicated CT imaging of the chest.  4.  Scattered pulmonary nodules in the imaged lung apices, the largest a 9 mm lesion in the right upper lobe. Recommend comparison with outside imaging ofthe chest if available.   05/03/2021 Echocardiogram    1. Left ventricular ejection fraction, by estimation, is 55 to 60%. The left ventricle has normal function. The left ventricle has no regional wall motion abnormalities. There is mild left ventricular hypertrophy. Left ventricular diastolic parameters are consistent with Grade I diastolic dysfunction (impaired relaxation).  2. Prominent echogenic structure on the RV free wall, possibly attached to the TV - measuring about 2.0 cm in diameter and 3.0 cm in length - some mobile filamentous material is attached, suggestive of thrombus or less likely tumor.. Right ventricular systolic function is normal. The right  ventricular size is normal.  3. The mitral valve is grossly normal. Trivial mitral valve regurgitation.  4. The aortic valve is tricuspid. Aortic valve regurgitation is not visualized.     05/04/2021 Imaging   RIGHT: - There is no evidence of deep vein thrombosis in the lower extremity.   - No cystic structure found in the popliteal fossa.   LEFT: - There is no evidence of deep vein thrombosis in the lower extremity.   - No cystic structure found in the popliteal fossa.     07/12/2021 Imaging   1. A large, heterogeneous mass about and within the right ventricle is difficult to clearly assess due to cardiac motion and mixing artifact on this non tailored examination, but suspect it has significantly increased in size, and now involves the right atrium in addition to the right ventricle and pulmonary outflow tract, measuring approximately 6.9 x 4.9 cm, previously 4.5 x 4.2 cm when measured similarly. This may reflect a combination of metastatic soft tissue and thrombus, and could be more clearly assessed by echocardiography and cardiac MRI. 2. No significant change of a right upper lobe pulmonary nodule, measuring 0.8 cm, which remains modestly suspicious for a metastasis. Other small nodules are likewise stable. Attention on follow-up. 3. Status post laryngectomy and tracheostomy. 4. Coronary artery disease.   07/24/2021 Echocardiogram    1. There is an echogenic RV and RVOT mass.  Attachment appears to be on the right ventricular mid wall. Mass is 32 by 24 mm, though may be underestimated. Right ventricular systolic function is normal. The right ventricular size is normal. There is normal pulmonary artery systolic pressure.  2. Left ventricular ejection fraction, by estimation, is 60 to 65%. The left ventricle has normal function. The left ventricle has no regional wall motion abnormalities. Left ventricular diastolic parameters are consistent with Grade I diastolic dysfunction (impaired  relaxation). The average left ventricular global longitudinal strain is -19.0 %. The global longitudinal strain is normal.  3. The mitral valve is normal in structure. No evidence of mitral valve regurgitation.  4. The aortic valve is tricuspid. Aortic valve regurgitation is not visualized. No aortic stenosis is present.   Comparison(s): A prior study was performed on 05/03/21. Prior images reviewed side by side. Similar size of RV mass to slight increase. Do not see right atrial involvement in this study.   Conclusion(s)/Recommendation(s): If concerned for tumor vs thombus, cardiac MRI could be considered   08/02/2021 Imaging   1. Large RV mass measuring 34mm x 29mm x 58mm. Mass is isointense to myocardium on T1 weighted imaging, does not suppress with fat saturation, isointense on T2 weighted imaging, perfuses on first pass perfusion, and heterogenous enhancement on LGE imaging. This is consistent with tumor, and in setting of known metastatic laryngeal cancer, is consistent with metastasis. There does appear to be thrombus adherent to tumor   2. Normal LV size and systolic function (EF 66%). No late gadolinium enhancement to suggest myocardial scar   3.  Normal RV size with mild systolic dysfunction (EF 41%)   08/17/2021 - 01/18/2022 Chemotherapy   Patient is on Treatment Plan : HEAD/NECK Cisplatin q7d     10/31/2021 Imaging   1. RV mass measures 40mm x 65mm x 12mm, similar to prior cardiac MRI on 08/01/21. As described on previous MRI, mass is isointense to myocardium on T1 and T2 weighted imaging, with heterogenous enhancement on LGE imaging. This is consistent with tumor, and in setting of known metastatic laryngeal cancer, is consistent with metastasis.   2. Normal LV size and systolic function (EF 68%). No LGE to suggest myocardial scar   3.  Normal RV size with low normal systolic function (EF 47%)   01/28/2022 Imaging   RV mass concerning for malignancy   Similar in size from 10/29/21.    Normal RV function in this study.   04/30/2022 Imaging   1. Heterogeneous mass of the right ventricle is decreased in size when compared with most recent prior CT. 2. Stable mildly enlarged right lower paratracheal lymph node. 3. Stable solid pulmonary nodule of the right upper lobe measuring 8 mm. 4.  Aortic Atherosclerosis (ICD10-I70.0).   08/05/2022 Imaging   1. Stable examination demonstrating postoperative changes of laryngectomy and tracheostomy, with grossly stable size of treated metastatic lesion in the right ventricle of the heart. 2. Borderline enlarged low right paratracheal lymph node, stable compared to the prior study. 3. Small right upper lobe pulmonary nodule, stable compared to prior examination, favored to be benign. 4. Aortic atherosclerosis, in addition to left main and 2 vessel coronary artery disease. Assessment for potential risk factor modification, dietary therapy or pharmacologic therapy may be warranted, if clinically indicated.      PHYSICAL EXAMINATION: ECOG PERFORMANCE STATUS: 0 - Asymptomatic  Vitals:   10/04/22 0850  BP: 130/82  Pulse: 95  Resp: 18  Temp: 97.7 F (36.5 C)  SpO2: 98%  Filed Weights   10/04/22 0850  Weight: 135 lb 9.6 oz (61.5 kg)    GENERAL:alert, no distress and comfortable NEURO: alert & oriented x 3 with fluent speech, no focal motor/sensory deficits  LABORATORY DATA:  I have reviewed the data as listed    Component Value Date/Time   NA 139 10/04/2022 0829   K 4.3 10/04/2022 0829   CL 105 10/04/2022 0829   CO2 27 10/04/2022 0829   GLUCOSE 109 (H) 10/04/2022 0829   BUN 30 (H) 10/04/2022 0829   CREATININE 0.88 10/04/2022 0829   CREATININE 0.96 02/01/2022 0838   CALCIUM 9.1 10/04/2022 0829   PROT 7.1 10/04/2022 0829   ALBUMIN 4.0 10/04/2022 0829   AST 26 10/04/2022 0829   AST 19 08/14/2021 0905   ALT 25 10/04/2022 0829   ALT 16 08/14/2021 0905   ALKPHOS 54 10/04/2022 0829   BILITOT 0.4 10/04/2022 0829   BILITOT  0.3 08/14/2021 0905   GFRNONAA >60 10/04/2022 0829   GFRNONAA >60 02/01/2022 0838   GFRAA >60 06/08/2020 1303   GFRAA >60 02/17/2020 1128    No results found for: "SPEP", "UPEP"  Lab Results  Component Value Date   WBC 3.9 (L) 10/04/2022   NEUTROABS 2.2 10/04/2022   HGB 12.7 10/04/2022   HCT 37.4 10/04/2022   MCV 87.8 10/04/2022   PLT 188 10/04/2022      Chemistry      Component Value Date/Time   NA 139 10/04/2022 0829   K 4.3 10/04/2022 0829   CL 105 10/04/2022 0829   CO2 27 10/04/2022 0829   BUN 30 (H) 10/04/2022 0829   CREATININE 0.88 10/04/2022 0829   CREATININE 0.96 02/01/2022 0838      Component Value Date/Time   CALCIUM 9.1 10/04/2022 0829   ALKPHOS 54 10/04/2022 0829   AST 26 10/04/2022 0829   AST 19 08/14/2021 0905   ALT 25 10/04/2022 0829   ALT 16 08/14/2021 0905   BILITOT 0.4 10/04/2022 0829   BILITOT 0.3 08/14/2021 0905

## 2022-10-04 NOTE — Assessment & Plan Note (Signed)
Her TSH is intermittently elevated I will adjust the dose of Synthroid as needed

## 2022-10-07 ENCOUNTER — Other Ambulatory Visit: Payer: Self-pay

## 2022-10-23 ENCOUNTER — Other Ambulatory Visit (HOSPITAL_COMMUNITY): Payer: Self-pay

## 2022-10-23 ENCOUNTER — Other Ambulatory Visit: Payer: Self-pay | Admitting: Hematology and Oncology

## 2022-10-23 DIAGNOSIS — C321 Malignant neoplasm of supraglottis: Secondary | ICD-10-CM

## 2022-10-23 MED ORDER — HYDROCODONE-ACETAMINOPHEN 5-325 MG PO TABS
1.0000 | ORAL_TABLET | Freq: Four times a day (QID) | ORAL | 0 refills | Status: DC | PRN
  Filled 2022-10-23: qty 60, 15d supply, fill #0

## 2022-10-23 MED ORDER — APIXABAN 2.5 MG PO TABS
2.5000 mg | ORAL_TABLET | Freq: Two times a day (BID) | ORAL | 3 refills | Status: DC
  Filled 2022-10-23: qty 60, 30d supply, fill #0
  Filled 2022-11-18: qty 60, 30d supply, fill #1
  Filled 2022-12-15: qty 60, 30d supply, fill #2
  Filled 2023-01-13: qty 60, 30d supply, fill #3

## 2022-10-24 ENCOUNTER — Other Ambulatory Visit: Payer: Medicare HMO

## 2022-10-31 ENCOUNTER — Ambulatory Visit: Payer: Medicare HMO | Admitting: Hematology and Oncology

## 2022-11-03 ENCOUNTER — Encounter: Payer: Self-pay | Admitting: Hematology and Oncology

## 2022-11-04 ENCOUNTER — Other Ambulatory Visit: Payer: Self-pay

## 2022-11-04 ENCOUNTER — Other Ambulatory Visit (HOSPITAL_COMMUNITY): Payer: Self-pay

## 2022-11-04 MED ORDER — LEVOTHYROXINE SODIUM 125 MCG PO TABS
125.0000 ug | ORAL_TABLET | Freq: Every day | ORAL | 3 refills | Status: DC
  Filled 2022-11-04: qty 30, 30d supply, fill #0

## 2022-11-05 ENCOUNTER — Other Ambulatory Visit (HOSPITAL_COMMUNITY): Payer: Self-pay

## 2022-11-05 ENCOUNTER — Other Ambulatory Visit: Payer: Self-pay

## 2022-11-05 MED ORDER — LEVOTHYROXINE SODIUM 125 MCG PO TABS
125.0000 ug | ORAL_TABLET | Freq: Every day | ORAL | 6 refills | Status: DC
  Filled 2022-11-05 – 2022-11-06 (×5): qty 30, 30d supply, fill #0
  Filled 2022-12-15: qty 30, 30d supply, fill #1
  Filled 2023-01-13: qty 30, 30d supply, fill #2
  Filled 2023-02-14: qty 30, 30d supply, fill #3
  Filled 2023-03-15: qty 30, 30d supply, fill #4
  Filled 2023-04-11: qty 30, 30d supply, fill #5
  Filled 2023-05-13: qty 30, 30d supply, fill #6

## 2022-11-06 ENCOUNTER — Other Ambulatory Visit: Payer: Self-pay

## 2022-11-06 ENCOUNTER — Other Ambulatory Visit (HOSPITAL_COMMUNITY): Payer: Self-pay

## 2022-11-07 ENCOUNTER — Other Ambulatory Visit: Payer: Self-pay

## 2022-11-08 ENCOUNTER — Other Ambulatory Visit (HOSPITAL_COMMUNITY): Payer: Self-pay

## 2022-11-11 ENCOUNTER — Other Ambulatory Visit: Payer: Self-pay

## 2022-11-18 ENCOUNTER — Other Ambulatory Visit: Payer: Self-pay

## 2022-11-18 ENCOUNTER — Other Ambulatory Visit (HOSPITAL_COMMUNITY): Payer: Self-pay

## 2022-11-20 ENCOUNTER — Other Ambulatory Visit: Payer: Self-pay | Admitting: Hematology and Oncology

## 2022-11-20 ENCOUNTER — Telehealth: Payer: Self-pay

## 2022-11-20 ENCOUNTER — Other Ambulatory Visit (HOSPITAL_COMMUNITY): Payer: Self-pay

## 2022-11-20 DIAGNOSIS — C321 Malignant neoplasm of supraglottis: Secondary | ICD-10-CM

## 2022-11-20 MED ORDER — HYDROCODONE-ACETAMINOPHEN 5-325 MG PO TABS
1.0000 | ORAL_TABLET | Freq: Four times a day (QID) | ORAL | 0 refills | Status: DC | PRN
  Filled 2022-11-20: qty 60, 15d supply, fill #0

## 2022-11-20 NOTE — Telephone Encounter (Signed)
Called Pt regarding hydrocodone-acetaminophen rx refill request. Cindy answered and stated that Pt does not talk. Informed [name] that rx refill has been approved and is in process at [facility]. Cindy verbalized understanding.

## 2022-11-21 ENCOUNTER — Other Ambulatory Visit (HOSPITAL_COMMUNITY): Payer: Self-pay

## 2022-11-22 ENCOUNTER — Ambulatory Visit (HOSPITAL_COMMUNITY)
Admission: RE | Admit: 2022-11-22 | Discharge: 2022-11-22 | Disposition: A | Discharge status: Home / Self Care | Payer: Medicare HMO | Source: Ambulatory Visit | Attending: Hematology and Oncology | Admitting: Hematology and Oncology

## 2022-11-22 ENCOUNTER — Inpatient Hospital Stay: Payer: Medicare HMO | Attending: Hematology

## 2022-11-22 DIAGNOSIS — Z9221 Personal history of antineoplastic chemotherapy: Secondary | ICD-10-CM | POA: Insufficient documentation

## 2022-11-22 DIAGNOSIS — G893 Neoplasm related pain (acute) (chronic): Secondary | ICD-10-CM | POA: Insufficient documentation

## 2022-11-22 DIAGNOSIS — R918 Other nonspecific abnormal finding of lung field: Secondary | ICD-10-CM | POA: Diagnosis not present

## 2022-11-22 DIAGNOSIS — Z452 Encounter for adjustment and management of vascular access device: Secondary | ICD-10-CM | POA: Diagnosis not present

## 2022-11-22 DIAGNOSIS — Z79890 Hormone replacement therapy (postmenopausal): Secondary | ICD-10-CM | POA: Diagnosis not present

## 2022-11-22 DIAGNOSIS — C321 Malignant neoplasm of supraglottis: Secondary | ICD-10-CM | POA: Insufficient documentation

## 2022-11-22 DIAGNOSIS — Z79899 Other long term (current) drug therapy: Secondary | ICD-10-CM | POA: Insufficient documentation

## 2022-11-22 DIAGNOSIS — C7800 Secondary malignant neoplasm of unspecified lung: Secondary | ICD-10-CM | POA: Insufficient documentation

## 2022-11-22 DIAGNOSIS — E46 Unspecified protein-calorie malnutrition: Secondary | ICD-10-CM | POA: Insufficient documentation

## 2022-11-22 DIAGNOSIS — Z95828 Presence of other vascular implants and grafts: Secondary | ICD-10-CM

## 2022-11-22 DIAGNOSIS — C329 Malignant neoplasm of larynx, unspecified: Secondary | ICD-10-CM | POA: Insufficient documentation

## 2022-11-22 DIAGNOSIS — Z923 Personal history of irradiation: Secondary | ICD-10-CM | POA: Insufficient documentation

## 2022-11-22 DIAGNOSIS — E039 Hypothyroidism, unspecified: Secondary | ICD-10-CM | POA: Insufficient documentation

## 2022-11-22 DIAGNOSIS — C760 Malignant neoplasm of head, face and neck: Secondary | ICD-10-CM | POA: Diagnosis not present

## 2022-11-22 LAB — CBC WITH DIFFERENTIAL/PLATELET
Abs Immature Granulocytes: 0.01 10*3/uL (ref 0.00–0.07)
Basophils Absolute: 0 10*3/uL (ref 0.0–0.1)
Basophils Relative: 0 %
Eosinophils Absolute: 0.2 10*3/uL (ref 0.0–0.5)
Eosinophils Relative: 6 %
HCT: 38.3 % (ref 36.0–46.0)
Hemoglobin: 12.7 g/dL (ref 12.0–15.0)
Immature Granulocytes: 0 %
Lymphocytes Relative: 28 %
Lymphs Abs: 1.1 10*3/uL (ref 0.7–4.0)
MCH: 28.8 pg (ref 26.0–34.0)
MCHC: 33.2 g/dL (ref 30.0–36.0)
MCV: 86.8 fL (ref 80.0–100.0)
Monocytes Absolute: 0.4 10*3/uL (ref 0.1–1.0)
Monocytes Relative: 10 %
Neutro Abs: 2.2 10*3/uL (ref 1.7–7.7)
Neutrophils Relative %: 56 %
Platelets: 186 10*3/uL (ref 150–400)
RBC: 4.41 MIL/uL (ref 3.87–5.11)
RDW: 13.4 % (ref 11.5–15.5)
WBC: 3.9 10*3/uL — ABNORMAL LOW (ref 4.0–10.5)
nRBC: 0 % (ref 0.0–0.2)

## 2022-11-22 LAB — COMPREHENSIVE METABOLIC PANEL
ALT: 39 U/L (ref 0–44)
AST: 37 U/L (ref 15–41)
Albumin: 4.1 g/dL (ref 3.5–5.0)
Alkaline Phosphatase: 51 U/L (ref 38–126)
Anion gap: 8 (ref 5–15)
BUN: 34 mg/dL — ABNORMAL HIGH (ref 8–23)
CO2: 27 mmol/L (ref 22–32)
Calcium: 8.6 mg/dL — ABNORMAL LOW (ref 8.9–10.3)
Chloride: 103 mmol/L (ref 98–111)
Creatinine, Ser: 1 mg/dL (ref 0.44–1.00)
GFR, Estimated: 60 mL/min — ABNORMAL LOW (ref >60)
Glucose, Bld: 102 mg/dL — ABNORMAL HIGH (ref 70–99)
Potassium: 4.3 mmol/L (ref 3.5–5.1)
Sodium: 138 mmol/L (ref 135–145)
Total Bilirubin: 0.4 mg/dL (ref 0.3–1.2)
Total Protein: 7.4 g/dL (ref 6.5–8.1)

## 2022-11-22 LAB — TSH: TSH: 5.637 u[IU]/mL — ABNORMAL HIGH (ref 0.350–4.500)

## 2022-11-22 MED ORDER — SODIUM CHLORIDE 0.9% FLUSH
10.0000 mL | Freq: Once | INTRAVENOUS | Status: AC
  Administered 2022-11-22: 10 mL

## 2022-11-22 MED ORDER — SODIUM CHLORIDE (PF) 0.9 % IJ SOLN
INTRAMUSCULAR | Status: AC
  Filled 2022-11-22: qty 50

## 2022-11-22 MED ORDER — IOHEXOL 300 MG/ML  SOLN
75.0000 mL | Freq: Once | INTRAMUSCULAR | Status: AC | PRN
  Administered 2022-11-22: 75 mL via INTRAVENOUS

## 2022-11-22 MED ORDER — HEPARIN SOD (PORK) LOCK FLUSH 100 UNIT/ML IV SOLN
INTRAVENOUS | Status: AC
  Filled 2022-11-22: qty 5

## 2022-11-22 MED ORDER — HEPARIN SOD (PORK) LOCK FLUSH 100 UNIT/ML IV SOLN
500.0000 [IU] | Freq: Once | INTRAVENOUS | Status: AC
  Administered 2022-11-22: 500 [IU] via INTRAVENOUS

## 2022-11-29 ENCOUNTER — Encounter: Payer: Self-pay | Admitting: Hematology and Oncology

## 2022-11-29 ENCOUNTER — Inpatient Hospital Stay: Payer: Medicare HMO | Admitting: Hematology and Oncology

## 2022-11-29 VITALS — BP 114/80 | HR 91 | Temp 98.0°F | Resp 16 | Ht 62.0 in | Wt 144.6 lb

## 2022-11-29 DIAGNOSIS — G893 Neoplasm related pain (acute) (chronic): Secondary | ICD-10-CM

## 2022-11-29 DIAGNOSIS — C7800 Secondary malignant neoplasm of unspecified lung: Secondary | ICD-10-CM | POA: Diagnosis not present

## 2022-11-29 DIAGNOSIS — Z9221 Personal history of antineoplastic chemotherapy: Secondary | ICD-10-CM | POA: Diagnosis not present

## 2022-11-29 DIAGNOSIS — C321 Malignant neoplasm of supraglottis: Secondary | ICD-10-CM | POA: Diagnosis not present

## 2022-11-29 DIAGNOSIS — C329 Malignant neoplasm of larynx, unspecified: Secondary | ICD-10-CM

## 2022-11-29 DIAGNOSIS — E039 Hypothyroidism, unspecified: Secondary | ICD-10-CM | POA: Diagnosis not present

## 2022-11-29 DIAGNOSIS — E46 Unspecified protein-calorie malnutrition: Secondary | ICD-10-CM | POA: Diagnosis not present

## 2022-11-29 DIAGNOSIS — Z79899 Other long term (current) drug therapy: Secondary | ICD-10-CM | POA: Diagnosis not present

## 2022-11-29 DIAGNOSIS — Z923 Personal history of irradiation: Secondary | ICD-10-CM | POA: Diagnosis not present

## 2022-11-29 DIAGNOSIS — Z931 Gastrostomy status: Secondary | ICD-10-CM | POA: Diagnosis not present

## 2022-11-29 DIAGNOSIS — Z79890 Hormone replacement therapy (postmenopausal): Secondary | ICD-10-CM | POA: Diagnosis not present

## 2022-11-29 DIAGNOSIS — Z452 Encounter for adjustment and management of vascular access device: Secondary | ICD-10-CM | POA: Diagnosis not present

## 2022-11-29 MED ORDER — KATE FARMS PEPTIDE 1.5 PO LIQD: ORAL | 11 refills | Status: DC

## 2022-11-29 NOTE — Assessment & Plan Note (Signed)
She has stable pain control She will continue the same medications for pain 

## 2022-11-29 NOTE — Assessment & Plan Note (Signed)
I have reviewed CT imaging with the patient and her son Overall, there were no signs of disease progression She has been off treatment for almost a year I plan to space out interval imaging study to every 6 months I plan to repeat imaging study around September We will continue port maintence flushes and close monitoring of her blood work in 2 months

## 2022-11-29 NOTE — Assessment & Plan Note (Signed)
Due to her prior surgery and radiation, she cannot eat food in normal way She is dependent on nutritional supplement I really fill her prescription for nutritional supplement, reduced to 3 bottles a day per patient request

## 2022-11-29 NOTE — Assessment & Plan Note (Signed)
She has intermittent elevated TSH I recommend observation for now without dose adjustment Her dose has been adjusted at 1 down many times over the past few months

## 2022-11-29 NOTE — Progress Notes (Signed)
[facility] OFF[facility] PROGRESS NOTE  Patient Care Team: Hamrick, Lorin Mercy, MD as PCP - General (Family Medicine) Heath Lark, MD as Consulting Physician (Hematology and Oncology)  ASSESSMENT & PLAN:  Laryngeal squamous cell carcinoma ([place]) I have reviewed CT imaging with the patient and her son Overall, there were no signs of disease progression She has been off treatment for almost a year I plan to space out interval imaging study to every 6 months I plan to repeat imaging study around September We will continue port maintence flushes and close monitoring of her blood work in 2 months  Cancer associated pain She has stable pain control She will continue the same medications for pain  Protein malnutrition ([place]) Due to her prior surgery and radiation, she cannot eat food in normal way She is dependent on nutritional supplement I really fill her prescription for nutritional supplement, reduced to 3 bottles a day per patient request  Hypothyroidism She has intermittent elevated TSH I recommend observation for now without dose adjustment Her dose has been adjusted at 1 down many times over the past few months  No orders of the defined types were placed in this encounter.   All questions were answered. The patient knows to call the clinic with any problems, questions or concerns. The total time spent in the appointment was 30 minutes encounter with patients including review of chart and various tests results, discussions about plan of care and coordination of care plan   Heath Lark, MD 11/29/2022 9:13 AM  INTERVAL HISTORY: Please see below for problem oriented charting. she returns for review test results with her son She is doing well She has occasional mild bleeding from the trach She is not able to finish for bottles of nutritional supplement per day and request new prescription Her pain is well-controlled Denies recent infection, fever or chills  REVIEW OF  SYSTEMS:   Constitutional: Denies fevers, chills or abnormal weight loss Eyes: Denies blurriness of vision Ears, nose, mouth, throat, and face: Denies mucositis or sore throat Respiratory: Denies cough, dyspnea or wheezes Cardiovascular: Denies palpitation, chest discomfort or lower extremity swelling Gastrointestinal:  Denies nausea, heartburn or change in bowel habits Skin: Denies abnormal skin rashes Lymphatics: Denies new lymphadenopathy or easy bruising Neurological:Denies numbness, tingling or new weaknesses Behavioral/Psych: Mood is stable, no new changes  All other systems were reviewed with the patient and are negative.  I have reviewed the past medical history, past surgical history, social history and family history with the patient and they are unchanged from previous note.  ALLERGIES:  has no active allergies.  MEDICATIONS:  Current Outpatient Medications  Medication Sig Dispense Refill   apixaban (ELIQUIS) 2.5 MG TABS tablet Take 1 tablet (2.5 mg total) by mouth 2 (two) times daily. 60 tablet 3   Cholecalciferol (VITAMIN D3) 125 MCG (5000 UT) CAPS Take 5,000 Units by mouth daily.     HYDROcodone-acetaminophen (NORCO/VICODIN) 5-325 MG tablet Take 1 tablet by mouth every 6 (six) hours as needed for moderate pain. 60 tablet 0   levothyroxine (SYNTHROID) 125 MCG tablet Take 1 tablet (125 mcg total) by mouth daily before breakfast. 30 tablet 6   lidocaine-prilocaine (EMLA) cream Apply topically daily as needed. 30 g 9   Nutritional Supplements (KATE FARMS PEPTIDE 1.5) LIQD 3 bottles daily 325 mL 11   oxymetazoline (AFRIN) 0.05 % nasal spray Place 3 sprays into both nostrils 2 (two) times daily as needed (Bleeding from stoma). 30 mL 0   rosuvastatin (  CRESTOR) 10 MG tablet Take 10 mg by mouth daily.     No current facility-administered medications for this visit.    SUMMARY OF ONCOLOGIC HISTORY: Oncology History Overview Note  Progressed on pembrolizumab   Laryngeal  squamous cell carcinoma ([place])  12/02/2018 Imaging   CT neck: IMPRESSION: 1. Supraglottic laryngeal carcinoma extending across the anterior commissure and invading the prelaryngeal fat, left aryepiglottic fold and left aspect of the epiglottis. 2. Severe narrowing of the laryngeal airway. 3. Bilateral subcentimeter level 2A and 2A cervical lymph nodes.     12/02/2018 Imaging   CT  Chest:  IMPRESSION: No evidence of metastatic disease in the chest.   12/03/2018 Pathology Results   (Care Everywhere; [facility]) A: Larynx, supraglottis, biopsy - Atypical squamous proliferation with at least low grade dysplasia and abundant keratinization - No definite high grade dysplasia or invasive carcinoma represented (see comment)   12/18/2018 Pathology Results   (Care Everywhere; [facility]) A:  Neck, left, fine needle aspiration - Rare single atypical cells, no cells diagnostic of malignancy - Lymph node material present  B:  Neck, right, fine needle aspiration - Non-diagnostic specimen - No lymph node material identified - Mixed inflammation and blood   12/31/2018 Pathology Results   (Care Everywhere; [facility])  A: Larynx, left supraglottic mass, biopsy - Atypical squamous proliferation with abundant keratinization, suspicious for well-differentiated invasive squamous cell carcinoma  B: Larynx, left supraglottic mass, biopsy - Well-differentiated invasive squamous cell carcinoma with abundant keratinization (see comment)   01/18/2019 Initial Diagnosis   Laryngeal cancer ([place])   01/26/2019 Imaging   1. Locally advanced hypermetabolic laryngeal neoplasm centered in the left supraglottic space with bilateral glottic and epiglottic involvement and subglottic extension on the left. 2. Hypermetabolic bilateral level 3 neck nodal metastases. 3. Hypermetabolic high mediastinal nodal metastases between the trachea and upper thoracic esophagus. 4. Otherwise no distant hypermetabolic metastatic  disease. 5. Nodular 7 mm focus at the left lung base, below PET resolution, recommend attention on follow-up chest CT in 3 months. 6. Chronic findings include: Aortic Atherosclerosis (ICD10-I70.0). Cholelithiasis.   02/08/2019 Cancer Staging   Staging form: Larynx - Supraglottis, AJCC 8th Edition - Clinical stage from 02/08/2019: Stage IVA (cT3, cN2c, cM0) - Signed by Eppie Gibson, MD on 02/08/2019   02/11/2019 - 04/07/2019 Chemotherapy   The patient weekly cisplatin for chemotherapy treatment.     07/20/2019 Pathology Results   PROCEDURE: Total glossectomy, total pharyngectomy, total laryngectomy, total thyroidectomy, and cervical esophagectomy TUMOR SITE: Larynx TUMOR LATERALITY: Not specified TUMOR FOCALITY: Unifocal TUMOR SIZE:    GREATEST DIMENSION: 6.9 cm    ADDITIONAL DIMENSIONS: 5.4 x 2.4 cm HISTOLOGIC TYPE: Squamous cell carcinoma, conventional (keratinizing) HISTOLOGIC GRADE:  G1-G2: Well to moderately differentiated TUMOR EXTENSION: Tumor involves the posterior tongue, pharynx, larynx, tracheal cartilage, hyoid bone, cervical esophagus, vagus nerve, carotid artery, deep cervical fascia, ans surrounding skeletal muscle and soft tissue MARGINS: Uninvolved by invasive tumor; Perineural invasion is less than 54m from the left soft tissue margin LYMPHOVASCULAR INVASION: Not identified PERINEURAL INVASION:  Present REGIONAL LYMPH NODES:     NUMBER OF LYMPH NODES INVOLVED: 2     NUMBER OF LYMPH NODES EXAMINED: 24     LATERALITY OF LYMPH NODES INVOLVED: Cannot determine     SIZE OF LARGEST METASTATIC DEPOSIT: 0.8 cm     EXTRANODAL EXTENSION: Not identified PATHOLOGIC STAGE CLASSIFICATION (pTNM, AJCC 8TH Ed): (r)(y) pT4b pN2a DISTANT METASTASIS (pM): ADDITIONAL PATHOLOGIC FINDINGS: Previous chemoradiation   11/04/2019 Imaging  PET: 1.  Hypermetabolic bilateral neck lesions as detailed above are concerning for disease recurrence. 2.  Compared to 07/08/2019 PET scan,  interval development of subcentimeter bilateral upper lobe pulmonary nodules, size below PET resolution. These are concerning for metastasis. Consider diagnostic quality CT chest exam for further evaluation as per oncology treatment planning. 3.  Focal hypermetabolic uptake in right ventricular cavity of indeterminate significance. Recommend echocardiographic examination to rule out underlying thrombus. This finding was discussed with Dr. Conley Canal by Dr. Clementeen Graham and Dr. Marcello Moores on 11/04/2019 at approximately 3:00 PM via telephone. 4.  Ancillary CT findings as above.   11/05/2019 Imaging   Echocardiogram: Summary   1. The left ventricle is normal in size with normal wall thickness.   2. The left ventricular systolic function is normal, LVEF is visually estimated at 60-65%.   3. The right ventricle is normal in size, with normal systolic function.   4. There is an extrinsic compression of the RV and RA laterally at the AV groove by a mass of 4.3 cm diameter. Unclear if the mass is invasive, although no pericardial effusion seen suggestive no invasion possibly. There is no evidence of hemodynamic compromise secondary to cardiac compression with normal IVC diameter.   12/06/2019 Pathology Results   A. LYMPH NODE, LEFT SUBMANDIBULAR, BIOPSY:  - Squamous cell carcinoma.  - No distinct nodal tissue identified.    12/06/2019 Procedure   Successful ultrasound-guided core biopsy of centrally necrotic left submandibular lymph node. Of note, due to extensive prior postsurgical and post radiation changes, patient has very limited mobility in the neck and the biopsy was extremely challenging    12/16/2019 -  Chemotherapy   The patient had pembrolizumab for chemotherapy treatment.     03/08/2020 PET scan   1. Diminished size of masses in the neck as described. Persistent increased metabolic activity particularly in the LEFT neck. Some of this diminished size on the LEFT could be due to decompression of  necrotic material. Correlate with any symptoms of inflammation or infection in this area as well given the paucity of residual soft tissue associated with the above abnormality, overall findings are compatible with residual disease in this location and there is no significant surrounding stranding to indicate inflammation that would correlate with the degree of FDG uptake remaining. 2. Diminished activity in the juxta cardiac mass with peripheral activity suggesting interval necrosis, on the prior study this was uniformly hypermetabolic. There is still considerable FDG uptake. 3. No signs of disease in the abdomen or pelvis. 4. 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 Correlate with any new pain or symptoms in this area with further imaging as warranted. No discrete mass or bone destruction on today's study.   06/08/2020 Imaging   CT neck 1. Status post laryngectomy and neck dissection without lymphadenopathy or mass lesion. 2. Short segment severe stenosis of the left internal carotid artery adjacent to a surgical clip.     06/08/2020 Imaging   CT chest 1. There is a large, hypodense lesion within, or very closely abutting and likely intruding upon the right ventricle, as seen on prior examination and approximately 4.6 x 4.3 cm, poorly evaluated due to cardiac motion. Findings are consistent with metastatic lesion and/or thrombus and poorly assessed by ungated CT. Consider echocardiography and cardiac MRI for further assessment.   2. Multiple small pulmonary nodules in the bilateral lung apices are unchanged. Attention on follow-up.   3.  No evidence of new metastatic disease in the chest.   4.  Aortic Atherosclerosis (ICD10-I70.0).     09/07/2020 Imaging   1. Mild decrease in size of juxta cardiac metastatic involving the right ventricle. 2. Small pulmonary nodules are unchanged from previous exam. 3. Aortic atherosclerosis. 4. Gallstone.     12/14/2020 Procedure   Successful  exchange and up size from 20 French pull-through gastrostomy to 22 French balloon retention gastrostomy.   03/30/2021 Imaging   Ct neck 1.  In comparison with PET/CT from February 2021, marked interval decrease in size of soft tissue mass along the angle of the left mandible consistent with a positive treatment response. However, there are new destructive bony changes along the lingual surface of the mandible in this region with appearance of soft tissue infiltrating the marrow cavity. While concerning for neoplastic involvement, changes could also represent other nonspecific osteonecrosis including infection or sequela of prior radiation treatment. Recommend correlation with direct visual inspection. Further evaluation with MRI may be of value.  2.  Centrally necrotic 1.3 cm right level IIa lymph node corresponding with previously described hypermetabolic lesion, decreased in size from prior. No suspicious new or enlarged lymph nodes in the neck.  3.  Enlarged right lower paratracheal lymph node suspected in the mediastinum, incompletely imaged. Consider dedicated CT imaging of the chest.  4.  Scattered pulmonary nodules in the imaged lung apices, the largest a 9 mm lesion in the right upper lobe. Recommend comparison with outside imaging ofthe chest if available.   05/03/2021 Echocardiogram    1. Left ventricular ejection fraction, by estimation, is 55 to 60%. The left ventricle has normal function. The left ventricle has no regional wall motion abnormalities. There is mild left ventricular hypertrophy. Left ventricular diastolic parameters are consistent with Grade I diastolic dysfunction (impaired relaxation).  2. Prominent echogenic structure on the RV free wall, possibly attached to the TV - measuring about 2.0 cm in diameter and 3.0 cm in length - some mobile filamentous material is attached, suggestive of thrombus or less likely tumor.. Right ventricular systolic function is normal. The right  ventricular size is normal.  3. The mitral valve is grossly normal. Trivial mitral valve regurgitation.  4. The aortic valve is tricuspid. Aortic valve regurgitation is not visualized.     05/04/2021 Imaging   RIGHT: - There is no evidence of deep vein thrombosis in the lower extremity.   - No cystic structure found in the popliteal fossa.   LEFT: - There is no evidence of deep vein thrombosis in the lower extremity.   - No cystic structure found in the popliteal fossa.     07/12/2021 Imaging   1. A large, heterogeneous mass about and within the right ventricle is difficult to clearly assess due to cardiac motion and mixing artifact on this non tailored examination, but suspect it has significantly increased in size, and now involves the right atrium in addition to the right ventricle and pulmonary outflow tract, measuring approximately 6.9 x 4.9 cm, previously 4.5 x 4.2 cm when measured similarly. This may reflect a combination of metastatic soft tissue and thrombus, and could be more clearly assessed by echocardiography and cardiac MRI. 2. No significant change of a right upper lobe pulmonary nodule, measuring 0.8 cm, which remains modestly suspicious for a metastasis. Other small nodules are likewise stable. Attention on follow-up. 3. Status post laryngectomy and tracheostomy. 4. Coronary artery disease.   07/24/2021 Echocardiogram    1. There is an echogenic RV and RVOT mass. Attachment appears to be on the right ventricular mid  wall. Mass is 32 by 24 mm, though may be underestimated. Right ventricular systolic function is normal. The right ventricular size is normal. There is normal pulmonary artery systolic pressure.  2. Left ventricular ejection fraction, by estimation, is 60 to 65%. The left ventricle has normal function. The left ventricle has no regional wall motion abnormalities. Left ventricular diastolic parameters are consistent with Grade I diastolic dysfunction (impaired  relaxation). The average left ventricular global longitudinal strain is -19.0 %. The global longitudinal strain is normal.  3. The mitral valve is normal in structure. No evidence of mitral valve regurgitation.  4. The aortic valve is tricuspid. Aortic valve regurgitation is not visualized. No aortic stenosis is present.   Comparison(s): A prior study was performed on 05/03/21. Prior images reviewed side by side. Similar size of RV mass to slight increase. Do not see right atrial involvement in this study.   Conclusion(s)/Recommendation(s): If concerned for tumor vs thombus, cardiac MRI could be considered   08/02/2021 Imaging   1. Large RV mass measuring 1m x 464mx 4112mMass is isointense to myocardium on T1 weighted imaging, does not suppress with fat saturation, isointense on T2 weighted imaging, perfuses on first pass perfusion, and heterogenous enhancement on LGE imaging. This is consistent with tumor, and in setting of known metastatic laryngeal cancer, is consistent with metastasis. There does appear to be thrombus adherent to tumor   2. Normal LV size and systolic function (EF [id number] late gadolinium enhancement to suggest myocardial scar   3.  Normal RV size with mild systolic dysfunction (EF [id number] 08/17/2021 - 01/18/2022 Chemotherapy   Patient is on Treatment Plan : HEAD/NECK Cisplatin q7d     10/31/2021 Imaging   1. RV mass measures [id number],75milar to prior cardiac MRI on 08/01/21. As described on previous MRI, mass is isointense to myocardium on T1 and T2 weighted imaging, with heterogenous enhancement on LGE imaging. This is consistent with tumor, and in setting of known metastatic laryngeal cancer, is consistent with metastasis.   2. Normal LV size and systolic function (EF 68%). [id number] to suggest myocardial scar   3.  Normal RV size with low normal systolic function (EF 47%)  [id number] Imaging   RV mass concerning for malignancy   Similar in size from 10/29/21.    Normal RV function in this study.   04/30/2022 Imaging   1. Heterogeneous mass of the right ventricle is decreased in size when compared with most recent prior CT. 2. Stable mildly enlarged right lower paratracheal lymph node. 3. Stable solid pulmonary nodule of the right upper lobe measuring 8 mm. 4.  Aortic Atherosclerosis (ICD10-I70.0).   08/05/2022 Imaging   1. Stable examination demonstrating postoperative changes of laryngectomy and tracheostomy, with grossly stable size of treated metastatic lesion in the right ventricle of the heart. 2. Borderline enlarged low right paratracheal lymph node, stable compared to the prior study. 3. Small right upper lobe pulmonary nodule, stable compared to prior examination, favored to be benign. 4. Aortic atherosclerosis, in addition to left main and 2 vessel coronary artery disease. Assessment for potential risk factor modification, dietary therapy or pharmacologic therapy may be warranted, if clinically indicated.    11/25/2022 Imaging   Once again there is a heterogeneous mass identified along the right side of the heart the right ventricle. Dimensions today are measuring smaller but it is difficult to confirm change in size with the level of cardiac motion.  Stable prominent mediastinal lymph nodes.   Tracheostomy tube. There is wall thickening and stranding along the upper trachea distal to the tip of the cannula. Please correlate for any symptoms of tracheitis.   Right upper lobe small lung nodule is similar to minimally increased. Simple attention on follow-up for the patient's neoplasm.   Aortic Atherosclerosis (ICD10-I70.0).     PHYSICAL EXAMINATION: ECOG PERFORMANCE STATUS: 1 - Symptomatic but completely ambulatory  Vitals:   11/29/22 0829  BP: 114/80  Pulse: 91  Resp: 16  Temp: 98 F (36.7 C)  SpO2: 97%   Filed Weights   11/29/22 0829  Weight: 144 lb 9.6 oz (65.6 kg)    GENERAL:alert, no distress and comfortable SKIN:  skin color, texture, turgor are normal, no rashes or significant lesions EYES: normal, Conjunctiva are pink and non-injected, sclera clear OROPHARYNX:no exudate, no erythema and lips, buccal mucosa, and tongue normal  NECK: Tracheostomy in situ NEURO: alert & oriented x 3   LABORATORY DATA:  I have reviewed the data as listed    Component Value Date/Time   NA 138 11/22/2022 0842   K 4.3 11/22/2022 0842   CL 103 11/22/2022 0842   CO2 27 11/22/2022 0842   GLUCOSE 102 (H) 11/22/2022 0842   BUN 34 (H) 11/22/2022 0842   CREATININE 1.00 11/22/2022 0842   CREATININE 0.96 02/01/2022 0838   CALCIUM 8.6 (L) 11/22/2022 0842   PROT 7.4 11/22/2022 0842   ALBUMIN 4.1 11/22/2022 0842   AST 37 11/22/2022 0842   AST 19 08/14/2021 0905   ALT 39 11/22/2022 0842   ALT 16 08/14/2021 0905   ALKPHOS 51 11/22/2022 0842   BILITOT 0.4 11/22/2022 0842   BILITOT 0.3 08/14/2021 0905   GFRNONAA 60 (L) 11/22/2022 0842   GFRNONAA >60 02/01/2022 0838   GFRAA >60 06/08/2020 1303   GFRAA >60 02/17/2020 1128    No results found for: "SPEP", "UPEP"  Lab Results  Component Value Date   WBC 3.9 (L) 11/22/2022   NEUTROABS 2.2 11/22/2022   HGB 12.7 11/22/2022   HCT 38.3 11/22/2022   MCV 86.8 11/22/2022   PLT 186 11/22/2022      Chemistry      Component Value Date/Time   NA 138 11/22/2022 0842   K 4.3 11/22/2022 0842   CL 103 11/22/2022 0842   CO2 27 11/22/2022 0842   BUN 34 (H) 11/22/2022 0842   CREATININE 1.00 11/22/2022 0842   CREATININE 0.96 02/01/2022 0838      Component Value Date/Time   CALCIUM 8.6 (L) 11/22/2022 0842   ALKPHOS 51 11/22/2022 0842   AST 37 11/22/2022 0842   AST 19 08/14/2021 0905   ALT 39 11/22/2022 0842   ALT 16 08/14/2021 0905   BILITOT 0.4 11/22/2022 0842   BILITOT 0.3 08/14/2021 0905       RADIOGRAPHIC STUDIES: I have reviewed imaging study with the patient and her son I have personally reviewed the radiological images as listed and agreed with the findings in  the report. CT CHEST W CONTRAST  Result Date: 11/24/2022 CLINICAL DATA:  Metastatic head and neck cancer. * Tracking Code: BO * EXAM: CT CHEST WITH CONTRAST TECHNIQUE: Multidetector CT imaging of the chest was performed during intravenous contrast administration. RADIATION DOSE REDUCTION: This exam was performed according to the departmental dose-optimization program which includes automated exposure control, adjustment of the mA and/or kV according to patient size and/or use of iterative reconstruction technique. CONTRAST:  57m OMNIPAQUE IOHEXOL 300 MG/ML  SOLN  COMPARISON:  Chest CT 08/02/2022 and older FINDINGS: Cardiovascular: Prominent epicardial fat. The heart is nonenlarged. Trace pericardial effusion. Once again there is a heterogeneous lesion involving the margin of the heart on the right side of the level of the right ventricle. Previously the lesion was measured at 6.9 x 3.8 cm. Today when attempting to measured in the same fashion lesion would measure proximally 5.0 by 3.4 cm. Diffuse atherosclerotic changes along the thoracic aorta. Coronary artery calcifications are noted. Right IJ chest port. The port is accessed. Mediastinum/Nodes: Surgical changes along the low neck. Tracheostomy tube. No specific abnormal lymph node enlargement identified in the axillary regions or hilum. There are surgical clips in the left axilla. There are some small mediastinal nodes identified. Example anterior to the right main bronchus which previously measured 12 x 12 mm, today on series 2, image 53 measures 12 x 10 mm. Other small nodes are stable in the mediastinum including subcarinal. There is wall thickening along the trachea caudal to the tip of the tracheostomy tube with some stranding. Please correlate for any clinical evidence of tracheitis. Normal caliber thoracic esophagus. Lungs/Pleura: Mild dependent atelectasis or scar. No consolidation, pneumothorax or effusion. Apical scarring and fibrotic changes  identified. Previously there was a right upper lobe ill-defined nodule measuring 7 x 3 mm. Today on series 5, image 38 this nodule measures 8 x 4 mm. Similar. No other true dominant lung nodule. Upper Abdomen: Along the upper abdomen adrenal glands are preserved. Moderate colonic stool. Of note there is loops of bowel seen between the anterior liver margin in the anterior abdominal wall. There is some thickening along the anterior abdominal wall along the left side. Please correlate for prior intervention. Musculoskeletal: Diffuse degenerative changes seen along the spine. Bridging osteophytes. Possible hemangioma within the vertebral body at T6 and T7. Unchanged from prior IMPRESSION: Once again there is a heterogeneous mass identified along the right side of the heart the right ventricle. Dimensions today are measuring smaller but it is difficult to confirm change in size with the level of cardiac motion. Stable prominent mediastinal lymph nodes. Tracheostomy tube. There is wall thickening and stranding along the upper trachea distal to the tip of the cannula. Please correlate for any symptoms of tracheitis. Right upper lobe small lung nodule is similar to minimally increased. Simple attention on follow-up for the patient's neoplasm. Aortic Atherosclerosis (ICD10-I70.0). Electronically Signed   By: Jill Side M.D.   On: 11/24/2022 14:15

## 2022-12-11 DIAGNOSIS — R633 Feeding difficulties, unspecified: Secondary | ICD-10-CM | POA: Diagnosis not present

## 2022-12-11 DIAGNOSIS — C321 Malignant neoplasm of supraglottis: Secondary | ICD-10-CM | POA: Diagnosis not present

## 2022-12-15 ENCOUNTER — Other Ambulatory Visit: Payer: Self-pay

## 2022-12-15 ENCOUNTER — Other Ambulatory Visit: Payer: Self-pay | Admitting: Hematology and Oncology

## 2022-12-15 DIAGNOSIS — C321 Malignant neoplasm of supraglottis: Secondary | ICD-10-CM

## 2022-12-16 ENCOUNTER — Encounter (HOSPITAL_COMMUNITY): Payer: Self-pay

## 2022-12-16 ENCOUNTER — Other Ambulatory Visit (HOSPITAL_COMMUNITY): Payer: Self-pay

## 2022-12-16 ENCOUNTER — Other Ambulatory Visit: Payer: Self-pay

## 2022-12-16 MED ORDER — HYDROCODONE-ACETAMINOPHEN 5-325 MG PO TABS
1.0000 | ORAL_TABLET | Freq: Four times a day (QID) | ORAL | 0 refills | Status: DC | PRN
  Filled 2022-12-16: qty 60, 15d supply, fill #0

## 2022-12-17 ENCOUNTER — Other Ambulatory Visit: Payer: Self-pay

## 2023-01-13 ENCOUNTER — Other Ambulatory Visit: Payer: Self-pay | Admitting: Hematology and Oncology

## 2023-01-13 ENCOUNTER — Other Ambulatory Visit (HOSPITAL_COMMUNITY): Payer: Self-pay

## 2023-01-13 ENCOUNTER — Other Ambulatory Visit: Payer: Self-pay

## 2023-01-13 DIAGNOSIS — C321 Malignant neoplasm of supraglottis: Secondary | ICD-10-CM

## 2023-01-13 MED ORDER — HYDROCODONE-ACETAMINOPHEN 5-325 MG PO TABS
1.0000 | ORAL_TABLET | Freq: Four times a day (QID) | ORAL | 0 refills | Status: DC | PRN
  Filled 2023-01-13: qty 60, 15d supply, fill #0

## 2023-01-21 DIAGNOSIS — C321 Malignant neoplasm of supraglottis: Secondary | ICD-10-CM | POA: Diagnosis not present

## 2023-01-21 DIAGNOSIS — R633 Feeding difficulties, unspecified: Secondary | ICD-10-CM | POA: Diagnosis not present

## 2023-01-24 ENCOUNTER — Inpatient Hospital Stay: Payer: Medicare HMO | Admitting: Hematology and Oncology

## 2023-01-24 ENCOUNTER — Encounter: Payer: Self-pay | Admitting: Hematology and Oncology

## 2023-01-24 ENCOUNTER — Other Ambulatory Visit (HOSPITAL_COMMUNITY): Payer: Self-pay

## 2023-01-24 ENCOUNTER — Other Ambulatory Visit: Payer: Self-pay

## 2023-01-24 ENCOUNTER — Inpatient Hospital Stay: Payer: Medicare HMO | Attending: Hematology and Oncology

## 2023-01-24 VITALS — BP 114/75 | HR 70 | Temp 97.5°F | Resp 18 | Ht 62.0 in | Wt 143.0 lb

## 2023-01-24 DIAGNOSIS — E039 Hypothyroidism, unspecified: Secondary | ICD-10-CM

## 2023-01-24 DIAGNOSIS — Z452 Encounter for adjustment and management of vascular access device: Secondary | ICD-10-CM | POA: Insufficient documentation

## 2023-01-24 DIAGNOSIS — Z79890 Hormone replacement therapy (postmenopausal): Secondary | ICD-10-CM | POA: Diagnosis not present

## 2023-01-24 DIAGNOSIS — C329 Malignant neoplasm of larynx, unspecified: Secondary | ICD-10-CM

## 2023-01-24 DIAGNOSIS — Z923 Personal history of irradiation: Secondary | ICD-10-CM | POA: Diagnosis not present

## 2023-01-24 DIAGNOSIS — C321 Malignant neoplasm of supraglottis: Secondary | ICD-10-CM | POA: Diagnosis not present

## 2023-01-24 DIAGNOSIS — G893 Neoplasm related pain (acute) (chronic): Secondary | ICD-10-CM

## 2023-01-24 DIAGNOSIS — Z9221 Personal history of antineoplastic chemotherapy: Secondary | ICD-10-CM | POA: Diagnosis not present

## 2023-01-24 DIAGNOSIS — Z95828 Presence of other vascular implants and grafts: Secondary | ICD-10-CM

## 2023-01-24 LAB — COMPREHENSIVE METABOLIC PANEL
ALT: 31 U/L (ref 0–44)
AST: 28 U/L (ref 15–41)
Albumin: 4.4 g/dL (ref 3.5–5.0)
Alkaline Phosphatase: 51 U/L (ref 38–126)
Anion gap: 8 (ref 5–15)
BUN: 24 mg/dL — ABNORMAL HIGH (ref 8–23)
CO2: 27 mmol/L (ref 22–32)
Calcium: 8.9 mg/dL (ref 8.9–10.3)
Chloride: 103 mmol/L (ref 98–111)
Creatinine, Ser: 0.95 mg/dL (ref 0.44–1.00)
GFR, Estimated: >60 mL/min (ref >60)
Glucose, Bld: 95 mg/dL (ref 70–99)
Potassium: 4.1 mmol/L (ref 3.5–5.1)
Sodium: 138 mmol/L (ref 135–145)
Total Bilirubin: 0.5 mg/dL (ref 0.3–1.2)
Total Protein: 7.3 g/dL (ref 6.5–8.1)

## 2023-01-24 LAB — CBC WITH DIFFERENTIAL/PLATELET
Abs Immature Granulocytes: 0.01 10*3/uL (ref 0.00–0.07)
Basophils Absolute: 0 10*3/uL (ref 0.0–0.1)
Basophils Relative: 1 %
Eosinophils Absolute: 0.5 10*3/uL (ref 0.0–0.5)
Eosinophils Relative: 12 %
HCT: 40.6 % (ref 36.0–46.0)
Hemoglobin: 13.2 g/dL (ref 12.0–15.0)
Immature Granulocytes: 0 %
Lymphocytes Relative: 29 %
Lymphs Abs: 1.3 10*3/uL (ref 0.7–4.0)
MCH: 28.9 pg (ref 26.0–34.0)
MCHC: 32.5 g/dL (ref 30.0–36.0)
MCV: 89 fL (ref 80.0–100.0)
Monocytes Absolute: 0.4 10*3/uL (ref 0.1–1.0)
Monocytes Relative: 10 %
Neutro Abs: 2.2 10*3/uL (ref 1.7–7.7)
Neutrophils Relative %: 48 %
Platelets: 180 10*3/uL (ref 150–400)
RBC: 4.56 MIL/uL (ref 3.87–5.11)
RDW: 13.9 % (ref 11.5–15.5)
WBC: 4.4 10*3/uL (ref 4.0–10.5)
nRBC: 0 % (ref 0.0–0.2)

## 2023-01-24 LAB — TSH: TSH: 3.53 u[IU]/mL (ref 0.350–4.500)

## 2023-01-24 MED ORDER — SODIUM CHLORIDE 0.9% FLUSH
10.0000 mL | Freq: Once | INTRAVENOUS | Status: AC
  Administered 2023-01-24: 10 mL

## 2023-01-24 MED ORDER — HYDROCODONE-ACETAMINOPHEN 5-325 MG PO TABS
1.0000 | ORAL_TABLET | Freq: Four times a day (QID) | ORAL | 0 refills | Status: DC | PRN
  Filled 2023-01-24: qty 60, 15d supply, fill #0

## 2023-01-24 MED ORDER — APIXABAN 2.5 MG PO TABS
2.5000 mg | ORAL_TABLET | Freq: Two times a day (BID) | ORAL | 3 refills | Status: DC
  Filled 2023-01-24 – 2023-02-14 (×2): qty 60, 30d supply, fill #0
  Filled 2023-03-15: qty 60, 30d supply, fill #1
  Filled 2023-04-11: qty 60, 30d supply, fill #2
  Filled 2023-05-13: qty 60, 30d supply, fill #3

## 2023-01-24 MED ORDER — HEPARIN SOD (PORK) LOCK FLUSH 100 UNIT/ML IV SOLN
500.0000 [IU] | Freq: Once | INTRAVENOUS | Status: AC
  Administered 2023-01-24: 500 [IU]

## 2023-01-24 NOTE — Assessment & Plan Note (Signed)
She has stable pain control She will continue the same medications for pain 

## 2023-01-24 NOTE — Assessment & Plan Note (Signed)
Her last Ct imaging in March showed no signs of disease progression She has been off treatment for a year I plan to space out interval imaging study to every 6 months I plan to repeat imaging study around September We will continue port maintence flushes and close monitoring of her blood work in 2 months

## 2023-01-24 NOTE — Progress Notes (Signed)
[facility] OFF[facility] PROGRESS NOTE  Patient Care Team: Hamrick, Durward Fortes, MD as PCP - General (Family Medicine) Artis Delay, MD as Consulting Physician (Hematology and Oncology)  ASSESSMENT & PLAN:  Laryngeal squamous cell carcinoma (HCC) Her last Ct imaging in March showed no signs of disease progression She has been off treatment for a year I plan to space out interval imaging study to every 6 months I plan to repeat imaging study around September We will continue port maintence flushes and close monitoring of her blood work in 2 months  Cancer associated pain She has stable pain control She will continue the same medications for pain  Orders Placed This Encounter  Procedures   TSH    Standing Status:   Standing    Number of Occurrences:   22    Standing Expiration Date:   01/24/2024    All questions were answered. The patient knows to call the clinic with any problems, questions or concerns. The total time spent in the appointment was 20 minutes encounter with patients including review of chart and various tests results, discussions about plan of care and coordination of care plan   Artis Delay, MD 01/24/2023 9:34 AM  INTERVAL HISTORY: Please see below for problem oriented charting. she returns for surveillance follow-up with her son She is doing well Her chronic pain is stable She has occasional mild intermittent bleeding from the trach but overall unremarkable She is able to maintain her nutritional intake and did not lose any weight  REVIEW OF SYSTEMS:   Constitutional: Denies fevers, chills or abnormal weight loss Eyes: Denies blurriness of vision Ears, nose, mouth, throat, and face: Denies mucositis or sore throat Respiratory: Denies cough, dyspnea or wheezes Cardiovascular: Denies palpitation, chest discomfort or lower extremity swelling Gastrointestinal:  Denies nausea, heartburn or change in bowel habits Skin: Denies abnormal skin rashes Lymphatics:  Denies new lymphadenopathy or easy bruising Neurological:Denies numbness, tingling or new weaknesses Behavioral/Psych: Mood is stable, no new changes  All other systems were reviewed with the patient and are negative.  I have reviewed the past medical history, past surgical history, social history and family history with the patient and they are unchanged from previous note.  ALLERGIES:  has no active allergies.  MEDICATIONS:  Current Outpatient Medications  Medication Sig Dispense Refill   apixaban (ELIQUIS) 2.5 MG TABS tablet Take 1 tablet (2.5 mg total) by mouth 2 (two) times daily. 60 tablet 3   Cholecalciferol (VITAMIN D3) 125 MCG (5000 UT) CAPS Take 5,000 Units by mouth daily.     HYDROcodone-acetaminophen (NORCO/VICODIN) 5-325 MG tablet Take 1 tablet by mouth every 6 (six) hours as needed for moderate pain. 60 tablet 0   levothyroxine (SYNTHROID) 125 MCG tablet Take 1 tablet (125 mcg total) by mouth daily before breakfast. 30 tablet 6   lidocaine-prilocaine (EMLA) cream Apply topically daily as needed. 30 g 9   Nutritional Supplements (KATE FARMS PEPTIDE 1.5) LIQD 3 bottles daily 325 mL 11   oxymetazoline (AFRIN) 0.05 % nasal spray Place 3 sprays into both nostrils 2 (two) times daily as needed (Bleeding from stoma). 30 mL 0   rosuvastatin (CRESTOR) 10 MG tablet Take 10 mg by mouth daily.     No current facility-administered medications for this visit.    SUMMARY OF ONCOLOGIC HISTORY: Oncology History Overview Note  Progressed on pembrolizumab   Laryngeal squamous cell carcinoma (HCC)  12/02/2018 Imaging   CT neck: IMPRESSION: 1. Supraglottic laryngeal carcinoma extending across the anterior  commissure and invading the prelaryngeal fat, left aryepiglottic fold and left aspect of the epiglottis. 2. Severe narrowing of the laryngeal airway. 3. Bilateral subcentimeter level 2A and 2A cervical lymph nodes.     12/02/2018 Imaging   CT  Chest:  IMPRESSION: No evidence of  metastatic disease in the chest.   12/03/2018 Pathology Results   (Care Everywhere; [facility]) A: Larynx, supraglottis, biopsy - Atypical squamous proliferation with at least low grade dysplasia and abundant keratinization - No definite high grade dysplasia or invasive carcinoma represented (see comment)   12/18/2018 Pathology Results   (Care Everywhere; [facility]) A:  Neck, left, fine needle aspiration - Rare single atypical cells, no cells diagnostic of malignancy - Lymph node material present  B:  Neck, right, fine needle aspiration - Non-diagnostic specimen - No lymph node material identified - Mixed inflammation and blood   12/31/2018 Pathology Results   (Care Everywhere; [facility])  A: Larynx, left supraglottic mass, biopsy - Atypical squamous proliferation with abundant keratinization, suspicious for well-differentiated invasive squamous cell carcinoma  B: Larynx, left supraglottic mass, biopsy - Well-differentiated invasive squamous cell carcinoma with abundant keratinization (see comment)   01/18/2019 Initial Diagnosis   Laryngeal cancer (HCC)   01/26/2019 Imaging   1. Locally advanced hypermetabolic laryngeal neoplasm centered in the left supraglottic space with bilateral glottic and epiglottic involvement and subglottic extension on the left. 2. Hypermetabolic bilateral level 3 neck nodal metastases. 3. Hypermetabolic high mediastinal nodal metastases between the trachea and upper thoracic esophagus. 4. Otherwise no distant hypermetabolic metastatic disease. 5. Nodular 7 mm focus at the left lung base, below PET resolution, recommend attention on follow-up chest CT in 3 months. 6. Chronic findings include: Aortic Atherosclerosis (ICD10-I70.0). Cholelithiasis.   02/08/2019 Cancer Staging   Staging form: Larynx - Supraglottis, AJCC 8th Edition - Clinical stage from 02/08/2019: Stage IVA (cT3, cN2c, cM0) - Signed by Lonie Peak, MD on 02/08/2019   02/11/2019 -  04/07/2019 Chemotherapy   The patient weekly cisplatin for chemotherapy treatment.     07/20/2019 Pathology Results   PROCEDURE: Total glossectomy, total pharyngectomy, total laryngectomy, total thyroidectomy, and cervical esophagectomy TUMOR SITE: Larynx TUMOR LATERALITY: Not specified TUMOR FOCALITY: Unifocal TUMOR SIZE:    GREATEST DIMENSION: 6.9 cm    ADDITIONAL DIMENSIONS: 5.4 x 2.4 cm HISTOLOGIC TYPE: Squamous cell carcinoma, conventional (keratinizing) HISTOLOGIC GRADE:  G1-G2: Well to moderately differentiated TUMOR EXTENSION: Tumor involves the posterior tongue, pharynx, larynx, tracheal cartilage, hyoid bone, cervical esophagus, vagus nerve, carotid artery, deep cervical fascia, ans surrounding skeletal muscle and soft tissue MARGINS: Uninvolved by invasive tumor; Perineural invasion is less than 1mm from the left soft tissue margin LYMPHOVASCULAR INVASION: Not identified PERINEURAL INVASION:  Present REGIONAL LYMPH NODES:     NUMBER OF LYMPH NODES INVOLVED: 2     NUMBER OF LYMPH NODES EXAMINED: 24     LATERALITY OF LYMPH NODES INVOLVED: Cannot determine     SIZE OF LARGEST METASTATIC DEPOSIT: 0.8 cm     EXTRANODAL EXTENSION: Not identified PATHOLOGIC STAGE CLASSIFICATION (pTNM, AJCC 8TH Ed): (r)(y) pT4b pN2a DISTANT METASTASIS (pM): ADDITIONAL PATHOLOGIC FINDINGS: Previous chemoradiation   11/04/2019 Imaging   PET: 1.  Hypermetabolic bilateral neck lesions as detailed above are concerning for disease recurrence. 2.  Compared to 07/08/2019 PET scan, interval development of subcentimeter bilateral upper lobe pulmonary nodules, size below PET resolution. These are concerning for metastasis. Consider diagnostic quality CT chest exam for further evaluation as per oncology treatment planning. 3.  Focal hypermetabolic uptake  in right ventricular cavity of indeterminate significance. Recommend echocardiographic examination to rule out underlying thrombus. This finding was  discussed with Dr. Lendell Caprice by Dr. Ahmed Prima and Dr. Maisie Fus on 11/04/2019 at approximately 3:00 PM via telephone. 4.  Ancillary CT findings as above.   11/05/2019 Imaging   Echocardiogram: Summary   1. The left ventricle is normal in size with normal wall thickness.   2. The left ventricular systolic function is normal, LVEF is visually estimated at 60-65%.   3. The right ventricle is normal in size, with normal systolic function.   4. There is an extrinsic compression of the RV and RA laterally at the AV groove by a mass of 4.3 cm diameter. Unclear if the mass is invasive, although no pericardial effusion seen suggestive no invasion possibly. There is no evidence of hemodynamic compromise secondary to cardiac compression with normal IVC diameter.   12/06/2019 Pathology Results   A. LYMPH NODE, LEFT SUBMANDIBULAR, BIOPSY:  - Squamous cell carcinoma.  - No distinct nodal tissue identified.    12/06/2019 Procedure   Successful ultrasound-guided core biopsy of centrally necrotic left submandibular lymph node. Of note, due to extensive prior postsurgical and post radiation changes, patient has very limited mobility in the neck and the biopsy was extremely challenging    12/16/2019 -  Chemotherapy   The patient had pembrolizumab for chemotherapy treatment.     03/08/2020 PET scan   1. Diminished size of masses in the neck as described. Persistent increased metabolic activity particularly in the LEFT neck. Some of this diminished size on the LEFT could be due to decompression of necrotic material. Correlate with any symptoms of inflammation or infection in this area as well given the paucity of residual soft tissue associated with the above abnormality, overall findings are compatible with residual disease in this location and there is no significant surrounding stranding to indicate inflammation that would correlate with the degree of FDG uptake remaining. 2. Diminished activity in the juxta cardiac  mass with peripheral activity suggesting interval necrosis, on the prior study this was uniformly hypermetabolic. There is still considerable FDG uptake. 3. No signs of disease in the abdomen or pelvis. 4. 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 Correlate with any new pain or symptoms in this area with further imaging as warranted. No discrete mass or bone destruction on today's study.   06/08/2020 Imaging   CT neck 1. Status post laryngectomy and neck dissection without lymphadenopathy or mass lesion. 2. Short segment severe stenosis of the left internal carotid artery adjacent to a surgical clip.     06/08/2020 Imaging   CT chest 1. There is a large, hypodense lesion within, or very closely abutting and likely intruding upon the right ventricle, as seen on prior examination and approximately 4.6 x 4.3 cm, poorly evaluated due to cardiac motion. Findings are consistent with metastatic lesion and/or thrombus and poorly assessed by ungated CT. Consider echocardiography and cardiac MRI for further assessment.   2. Multiple small pulmonary nodules in the bilateral lung apices are unchanged. Attention on follow-up.   3.  No evidence of new metastatic disease in the chest.   4.  Aortic Atherosclerosis (ICD10-I70.0).     09/07/2020 Imaging   1. Mild decrease in size of juxta cardiac metastatic involving the right ventricle. 2. Small pulmonary nodules are unchanged from previous exam. 3. Aortic atherosclerosis. 4. Gallstone.     12/14/2020 Procedure   Successful exchange and up size from 20 French pull-through gastrostomy to 22  French balloon retention gastrostomy.   03/30/2021 Imaging   Ct neck 1.  In comparison with PET/CT from February 2021, marked interval decrease in size of soft tissue mass along the angle of the left mandible consistent with a positive treatment response. However, there are new destructive bony changes along the lingual surface of the mandible in this region with  appearance of soft tissue infiltrating the marrow cavity. While concerning for neoplastic involvement, changes could also represent other nonspecific osteonecrosis including infection or sequela of prior radiation treatment. Recommend correlation with direct visual inspection. Further evaluation with MRI may be of value.  2.  Centrally necrotic 1.3 cm right level IIa lymph node corresponding with previously described hypermetabolic lesion, decreased in size from prior. No suspicious new or enlarged lymph nodes in the neck.  3.  Enlarged right lower paratracheal lymph node suspected in the mediastinum, incompletely imaged. Consider dedicated CT imaging of the chest.  4.  Scattered pulmonary nodules in the imaged lung apices, the largest a 9 mm lesion in the right upper lobe. Recommend comparison with outside imaging ofthe chest if available.   05/03/2021 Echocardiogram    1. Left ventricular ejection fraction, by estimation, is 55 to 60%. The left ventricle has normal function. The left ventricle has no regional wall motion abnormalities. There is mild left ventricular hypertrophy. Left ventricular diastolic parameters are consistent with Grade I diastolic dysfunction (impaired relaxation).  2. Prominent echogenic structure on the RV free wall, possibly attached to the TV - measuring about 2.0 cm in diameter and 3.0 cm in length - some mobile filamentous material is attached, suggestive of thrombus or less likely tumor.. Right ventricular systolic function is normal. The right ventricular size is normal.  3. The mitral valve is grossly normal. Trivial mitral valve regurgitation.  4. The aortic valve is tricuspid. Aortic valve regurgitation is not visualized.     05/04/2021 Imaging   RIGHT: - There is no evidence of deep vein thrombosis in the lower extremity.   - No cystic structure found in the popliteal fossa.   LEFT: - There is no evidence of deep vein thrombosis in the lower extremity.   - No  cystic structure found in the popliteal fossa.     07/12/2021 Imaging   1. A large, heterogeneous mass about and within the right ventricle is difficult to clearly assess due to cardiac motion and mixing artifact on this non tailored examination, but suspect it has significantly increased in size, and now involves the right atrium in addition to the right ventricle and pulmonary outflow tract, measuring approximately 6.9 x 4.9 cm, previously 4.5 x 4.2 cm when measured similarly. This may reflect a combination of metastatic soft tissue and thrombus, and could be more clearly assessed by echocardiography and cardiac MRI. 2. No significant change of a right upper lobe pulmonary nodule, measuring 0.8 cm, which remains modestly suspicious for a metastasis. Other small nodules are likewise stable. Attention on follow-up. 3. Status post laryngectomy and tracheostomy. 4. Coronary artery disease.   07/24/2021 Echocardiogram    1. There is an echogenic RV and RVOT mass. Attachment appears to be on the right ventricular mid wall. Mass is 32 by 24 mm, though may be underestimated. Right ventricular systolic function is normal. The right ventricular size is normal. There is normal pulmonary artery systolic pressure.  2. Left ventricular ejection fraction, by estimation, is 60 to 65%. The left ventricle has normal function. The left ventricle has no regional wall motion abnormalities. Left  ventricular diastolic parameters are consistent with Grade I diastolic dysfunction (impaired relaxation). The average left ventricular global longitudinal strain is -19.0 %. The global longitudinal strain is normal.  3. The mitral valve is normal in structure. No evidence of mitral valve regurgitation.  4. The aortic valve is tricuspid. Aortic valve regurgitation is not visualized. No aortic stenosis is present.   Comparison(s): A prior study was performed on 05/03/21. Prior images reviewed side by side. Similar size of RV mass to  slight increase. Do not see right atrial involvement in this study.   Conclusion(s)/Recommendation(s): If concerned for tumor vs thombus, cardiac MRI could be considered   08/02/2021 Imaging   1. Large RV mass measuring 52mm x 48mm x 41mm. Mass is isointense to myocardium on T1 weighted imaging, does not suppress with fat saturation, isointense on T2 weighted imaging, perfuses on first pass perfusion, and heterogenous enhancement on LGE imaging. This is consistent with tumor, and in setting of known metastatic laryngeal cancer, is consistent with metastasis. There does appear to be thrombus adherent to tumor   2. Normal LV size and systolic function (EF 66%). No late gadolinium enhancement to suggest myocardial scar   3.  Normal RV size with mild systolic dysfunction (EF 41%)   08/17/2021 - 01/18/2022 Chemotherapy   Patient is on Treatment Plan : HEAD/NECK Cisplatin q7d     10/31/2021 Imaging   1. RV mass measures 50mm x 46mm x 39mm, similar to prior cardiac MRI on 08/01/21. As described on previous MRI, mass is isointense to myocardium on T1 and T2 weighted imaging, with heterogenous enhancement on LGE imaging. This is consistent with tumor, and in setting of known metastatic laryngeal cancer, is consistent with metastasis.   2. Normal LV size and systolic function (EF 68%). No LGE to suggest myocardial scar   3.  Normal RV size with low normal systolic function (EF 47%)   01/28/2022 Imaging   RV mass concerning for malignancy   Similar in size from 10/29/21.   Normal RV function in this study.   04/30/2022 Imaging   1. Heterogeneous mass of the right ventricle is decreased in size when compared with most recent prior CT. 2. Stable mildly enlarged right lower paratracheal lymph node. 3. Stable solid pulmonary nodule of the right upper lobe measuring 8 mm. 4.  Aortic Atherosclerosis (ICD10-I70.0).   08/05/2022 Imaging   1. Stable examination demonstrating postoperative changes of  laryngectomy and tracheostomy, with grossly stable size of treated metastatic lesion in the right ventricle of the heart. 2. Borderline enlarged low right paratracheal lymph node, stable compared to the prior study. 3. Small right upper lobe pulmonary nodule, stable compared to prior examination, favored to be benign. 4. Aortic atherosclerosis, in addition to left main and 2 vessel coronary artery disease. Assessment for potential risk factor modification, dietary therapy or pharmacologic therapy may be warranted, if clinically indicated.    11/25/2022 Imaging   Once again there is a heterogeneous mass identified along the right side of the heart the right ventricle. Dimensions today are measuring smaller but it is difficult to confirm change in size with the level of cardiac motion.   Stable prominent mediastinal lymph nodes.   Tracheostomy tube. There is wall thickening and stranding along the upper trachea distal to the tip of the cannula. Please correlate for any symptoms of tracheitis.   Right upper lobe small lung nodule is similar to minimally increased. Simple attention on follow-up for the patient's neoplasm.   Aortic Atherosclerosis (  ICD10-I70.0).     PHYSICAL EXAMINATION: ECOG PERFORMANCE STATUS: 1 - Symptomatic but completely ambulatory  Vitals:   01/24/23 0844  BP: 114/75  Pulse: 70  Resp: 18  Temp: (!) 97.5 F (36.4 C)  SpO2: 97%   Filed Weights   01/24/23 0844  Weight: 143 lb (64.9 kg)    GENERAL:alert, no distress and comfortable SKIN: skin color, texture, turgor are normal, no rashes or significant lesions EYES: normal, Conjunctiva are pink and non-injected, sclera clear OROPHARYNX:no exudate, no erythema and lips, buccal mucosa, and tongue normal  NECK: Tracheostomy in situ LYMPH:  no palpable lymphadenopathy in the cervical, axillary or inguinal LUNGS: clear to auscultation and percussion with normal breathing effort HEART: regular rate & rhythm and no  murmurs and no lower extremity edema ABDOMEN:abdomen soft, non-tender and normal bowel sounds Musculoskeletal:no cyanosis of digits and no clubbing  NEURO: alert   LABORATORY DATA:  I have reviewed the data as listed    Component Value Date/Time   NA 138 01/24/2023 0825   K 4.1 01/24/2023 0825   CL 103 01/24/2023 0825   CO2 27 01/24/2023 0825   GLUCOSE 95 01/24/2023 0825   BUN 24 (H) 01/24/2023 0825   CREATININE 0.95 01/24/2023 0825   CREATININE 0.96 02/01/2022 0838   CALCIUM 8.9 01/24/2023 0825   PROT 7.3 01/24/2023 0825   ALBUMIN 4.4 01/24/2023 0825   AST 28 01/24/2023 0825   AST 19 08/14/2021 0905   ALT 31 01/24/2023 0825   ALT 16 08/14/2021 0905   ALKPHOS 51 01/24/2023 0825   BILITOT 0.5 01/24/2023 0825   BILITOT 0.3 08/14/2021 0905   GFRNONAA >60 01/24/2023 0825   GFRNONAA >60 02/01/2022 0838   GFRAA >60 06/08/2020 1303   GFRAA >60 02/17/2020 1128    No results found for: "SPEP", "UPEP"  Lab Results  Component Value Date   WBC 4.4 01/24/2023   NEUTROABS 2.2 01/24/2023   HGB 13.2 01/24/2023   HCT 40.6 01/24/2023   MCV 89.0 01/24/2023   PLT 180 01/24/2023      Chemistry      Component Value Date/Time   NA 138 01/24/2023 0825   K 4.1 01/24/2023 0825   CL 103 01/24/2023 0825   CO2 27 01/24/2023 0825   BUN 24 (H) 01/24/2023 0825   CREATININE 0.95 01/24/2023 0825   CREATININE 0.96 02/01/2022 0838      Component Value Date/Time   CALCIUM 8.9 01/24/2023 0825   ALKPHOS 51 01/24/2023 0825   AST 28 01/24/2023 0825   AST 19 08/14/2021 0905   ALT 31 01/24/2023 0825   ALT 16 08/14/2021 0905   BILITOT 0.5 01/24/2023 0825   BILITOT 0.3 08/14/2021 0905

## 2023-01-25 ENCOUNTER — Other Ambulatory Visit (HOSPITAL_COMMUNITY): Payer: Self-pay

## 2023-02-14 ENCOUNTER — Other Ambulatory Visit: Payer: Self-pay | Admitting: Hematology and Oncology

## 2023-02-14 DIAGNOSIS — C321 Malignant neoplasm of supraglottis: Secondary | ICD-10-CM

## 2023-02-15 ENCOUNTER — Other Ambulatory Visit (HOSPITAL_COMMUNITY): Payer: Self-pay

## 2023-02-15 MED ORDER — HYDROCODONE-ACETAMINOPHEN 5-325 MG PO TABS
1.0000 | ORAL_TABLET | Freq: Four times a day (QID) | ORAL | 0 refills | Status: DC | PRN
  Filled 2023-02-15: qty 60, 15d supply, fill #0

## 2023-02-17 ENCOUNTER — Other Ambulatory Visit: Payer: Self-pay

## 2023-03-11 DIAGNOSIS — C321 Malignant neoplasm of supraglottis: Secondary | ICD-10-CM | POA: Diagnosis not present

## 2023-03-11 DIAGNOSIS — R633 Feeding difficulties, unspecified: Secondary | ICD-10-CM | POA: Diagnosis not present

## 2023-03-12 NOTE — Progress Notes (Signed)
Faxed most recent MD note and signed form for formula to [employer] with [employer]: [phone] with receipt confirmation.

## 2023-03-15 ENCOUNTER — Other Ambulatory Visit: Payer: Self-pay | Admitting: Hematology and Oncology

## 2023-03-15 ENCOUNTER — Other Ambulatory Visit (HOSPITAL_COMMUNITY): Payer: Self-pay

## 2023-03-15 DIAGNOSIS — C321 Malignant neoplasm of supraglottis: Secondary | ICD-10-CM

## 2023-03-15 MED ORDER — HYDROCODONE-ACETAMINOPHEN 5-325 MG PO TABS
1.0000 | ORAL_TABLET | Freq: Four times a day (QID) | ORAL | 0 refills | Status: DC | PRN
  Filled 2023-03-15 (×2): qty 60, 15d supply, fill #0

## 2023-03-17 ENCOUNTER — Other Ambulatory Visit: Payer: Self-pay

## 2023-03-21 ENCOUNTER — Inpatient Hospital Stay: Payer: Medicare HMO | Admitting: Hematology and Oncology

## 2023-03-21 ENCOUNTER — Inpatient Hospital Stay: Payer: Medicare HMO | Attending: Hematology and Oncology

## 2023-03-21 ENCOUNTER — Encounter: Payer: Self-pay | Admitting: Hematology and Oncology

## 2023-03-21 VITALS — BP 113/79 | HR 71 | Temp 97.7°F | Resp 18 | Ht 62.0 in | Wt 146.0 lb

## 2023-03-21 DIAGNOSIS — Z79890 Hormone replacement therapy (postmenopausal): Secondary | ICD-10-CM | POA: Diagnosis not present

## 2023-03-21 DIAGNOSIS — Z7901 Long term (current) use of anticoagulants: Secondary | ICD-10-CM | POA: Insufficient documentation

## 2023-03-21 DIAGNOSIS — C7800 Secondary malignant neoplasm of unspecified lung: Secondary | ICD-10-CM | POA: Diagnosis not present

## 2023-03-21 DIAGNOSIS — C329 Malignant neoplasm of larynx, unspecified: Secondary | ICD-10-CM

## 2023-03-21 DIAGNOSIS — G893 Neoplasm related pain (acute) (chronic): Secondary | ICD-10-CM | POA: Diagnosis not present

## 2023-03-21 DIAGNOSIS — C321 Malignant neoplasm of supraglottis: Secondary | ICD-10-CM | POA: Diagnosis not present

## 2023-03-21 DIAGNOSIS — Z452 Encounter for adjustment and management of vascular access device: Secondary | ICD-10-CM | POA: Diagnosis not present

## 2023-03-21 DIAGNOSIS — Z79899 Other long term (current) drug therapy: Secondary | ICD-10-CM | POA: Insufficient documentation

## 2023-03-21 DIAGNOSIS — E039 Hypothyroidism, unspecified: Secondary | ICD-10-CM

## 2023-03-21 LAB — COMPREHENSIVE METABOLIC PANEL
ALT: 28 U/L (ref 0–44)
AST: 28 U/L (ref 15–41)
Albumin: 4.1 g/dL (ref 3.5–5.0)
Alkaline Phosphatase: 54 U/L (ref 38–126)
Anion gap: 7 (ref 5–15)
BUN: 24 mg/dL — ABNORMAL HIGH (ref 8–23)
CO2: 29 mmol/L (ref 22–32)
Calcium: 9.1 mg/dL (ref 8.9–10.3)
Chloride: 103 mmol/L (ref 98–111)
Creatinine, Ser: 0.92 mg/dL (ref 0.44–1.00)
GFR, Estimated: >60 mL/min (ref >60)
Glucose, Bld: 91 mg/dL (ref 70–99)
Potassium: 4.3 mmol/L (ref 3.5–5.1)
Sodium: 139 mmol/L (ref 135–145)
Total Bilirubin: 0.5 mg/dL (ref 0.3–1.2)
Total Protein: 7.5 g/dL (ref 6.5–8.1)

## 2023-03-21 LAB — CBC WITH DIFFERENTIAL/PLATELET
Abs Immature Granulocytes: 0 10*3/uL (ref 0.00–0.07)
Basophils Absolute: 0 10*3/uL (ref 0.0–0.1)
Basophils Relative: 0 %
Eosinophils Absolute: 0.3 10*3/uL (ref 0.0–0.5)
Eosinophils Relative: 7 %
HCT: 41.2 % (ref 36.0–46.0)
Hemoglobin: 13.8 g/dL (ref 12.0–15.0)
Immature Granulocytes: 0 %
Lymphocytes Relative: 33 %
Lymphs Abs: 1.6 10*3/uL (ref 0.7–4.0)
MCH: 29.6 pg (ref 26.0–34.0)
MCHC: 33.5 g/dL (ref 30.0–36.0)
MCV: 88.4 fL (ref 80.0–100.0)
Monocytes Absolute: 0.5 10*3/uL (ref 0.1–1.0)
Monocytes Relative: 11 %
Neutro Abs: 2.3 10*3/uL (ref 1.7–7.7)
Neutrophils Relative %: 49 %
Platelets: 190 10*3/uL (ref 150–400)
RBC: 4.66 MIL/uL (ref 3.87–5.11)
RDW: 13.2 % (ref 11.5–15.5)
WBC: 4.7 10*3/uL (ref 4.0–10.5)
nRBC: 0 % (ref 0.0–0.2)

## 2023-03-21 LAB — TSH: TSH: 3.421 u[IU]/mL (ref 0.350–4.500)

## 2023-03-21 NOTE — Assessment & Plan Note (Signed)
She has stable pain control She will continue the same medications for pain 

## 2023-03-21 NOTE — Assessment & Plan Note (Signed)
Her last Ct imaging in March showed no signs of disease progression She has been off treatment for a year I plan to space out interval imaging study to every 6 months I plan to repeat imaging study around end of August We will continue port maintence flushes and close monitoring of her blood work in 2 months

## 2023-03-21 NOTE — Progress Notes (Signed)
[facility] OFFICE PROGRESS NOTE  Patient Care Team: Hamrick, Durward Fortes, MD as PCP - General (Family Medicine) Artis Delay, MD as Consulting Physician (Hematology and Oncology)  ASSESSMENT & PLAN:  Laryngeal squamous cell carcinoma (HCC) Her last Ct imaging in March showed no signs of disease progression She has been off treatment for a year I plan to space out interval imaging study to every 6 months I plan to repeat imaging study around end of August We will continue port maintence flushes and close monitoring of her blood work in 2 months  Cancer associated pain She has stable pain control She will continue the same medications for pain  Orders Placed This Encounter  Procedures   CT CHEST W CONTRAST    Standing Status:   Future    Standing Expiration Date:   03/20/2024    Order Specific Question:   If indicated for the ordered procedure, I authorize the administration of contrast media per Radiology protocol    Answer:   Yes    Order Specific Question:   Preferred imaging location?    Answer:   [facility]    Order Specific Question:   Radiology Contrast Protocol - do NOT remove file path    Answer:   [email or web address]    All questions were answered. The patient knows to call the clinic with any problems, questions or concerns. The total time spent in the appointment was 25 minutes encounter with patients including review of chart and various tests results, discussions about plan of care and coordination of care plan   Artis Delay, MD 03/21/2023 1:38 PM  INTERVAL HISTORY: Please see below for problem oriented charting. she returns for surveillance follow-up accompanied by her son She is doing well She is maintaining nutrition Denies bleeding from tracheostomy No other side effects or symptoms Her chronic pain is stable  REVIEW OF SYSTEMS:   Constitutional: Denies fevers, chills or abnormal weight loss Eyes:  Denies blurriness of vision Ears, nose, mouth, throat, and face: Denies mucositis or sore throat Respiratory: Denies cough, dyspnea or wheezes Cardiovascular: Denies palpitation, chest discomfort or lower extremity swelling Gastrointestinal:  Denies nausea, heartburn or change in bowel habits Skin: Denies abnormal skin rashes Lymphatics: Denies new lymphadenopathy or easy bruising Neurological:Denies numbness, tingling or new weaknesses Behavioral/Psych: Mood is stable, no new changes  All other systems were reviewed with the patient and are negative.  I have reviewed the past medical history, past surgical history, social history and family history with the patient and they are unchanged from previous note.  ALLERGIES:  has no active allergies.  MEDICATIONS:  Current Outpatient Medications  Medication Sig Dispense Refill   apixaban (ELIQUIS) 2.5 MG TABS tablet Take 1 tablet (2.5 mg total) by mouth 2 (two) times daily. 60 tablet 3   Cholecalciferol (VITAMIN D3) 125 MCG (5000 UT) CAPS Take 5,000 Units by mouth daily.     HYDROcodone-acetaminophen (NORCO/VICODIN) 5-325 MG tablet Take 1 tablet by mouth every 6 (six) hours as needed for moderate pain. 60 tablet 0   levothyroxine (SYNTHROID) 125 MCG tablet Take 1 tablet (125 mcg total) by mouth daily before breakfast. 30 tablet 6   lidocaine-prilocaine (EMLA) cream Apply topically daily as needed. 30 g 9   Nutritional Supplements (KATE FARMS PEPTIDE 1.5) LIQD 3 bottles daily 325 mL 11   oxymetazoline (AFRIN) 0.05 % nasal spray Place 3 sprays into both nostrils 2 (two) times daily as needed (Bleeding from stoma). 30 mL 0  rosuvastatin (CRESTOR) 10 MG tablet Take 10 mg by mouth daily.     No current facility-administered medications for this visit.    SUMMARY OF ONCOLOGIC HISTORY: Oncology History Overview Note  Progressed on pembrolizumab   Laryngeal squamous cell carcinoma (HCC)  12/02/2018 Imaging   CT neck: IMPRESSION: 1.  Supraglottic laryngeal carcinoma extending across the anterior commissure and invading the prelaryngeal fat, left aryepiglottic fold and left aspect of the epiglottis. 2. Severe narrowing of the laryngeal airway. 3. Bilateral subcentimeter level 2A and 2A cervical lymph nodes.     12/02/2018 Imaging   CT  Chest:  IMPRESSION: No evidence of metastatic disease in the chest.   12/03/2018 Pathology Results   (Care Everywhere; [facility]) A: Larynx, supraglottis, biopsy - Atypical squamous proliferation with at least low grade dysplasia and abundant keratinization - No definite high grade dysplasia or invasive carcinoma represented (see comment)   12/18/2018 Pathology Results   (Care Everywhere; [facility]) A:  Neck, left, fine needle aspiration - Rare single atypical cells, no cells diagnostic of malignancy - Lymph node material present  B:  Neck, right, fine needle aspiration - Non-diagnostic specimen - No lymph node material identified - Mixed inflammation and blood   12/31/2018 Pathology Results   (Care Everywhere; [facility])  A: Larynx, left supraglottic mass, biopsy - Atypical squamous proliferation with abundant keratinization, suspicious for well-differentiated invasive squamous cell carcinoma  B: Larynx, left supraglottic mass, biopsy - Well-differentiated invasive squamous cell carcinoma with abundant keratinization (see comment)   01/18/2019 Initial Diagnosis   Laryngeal cancer (HCC)   01/26/2019 Imaging   1. Locally advanced hypermetabolic laryngeal neoplasm centered in the left supraglottic space with bilateral glottic and epiglottic involvement and subglottic extension on the left. 2. Hypermetabolic bilateral level 3 neck nodal metastases. 3. Hypermetabolic high mediastinal nodal metastases between the trachea and upper thoracic esophagus. 4. Otherwise no distant hypermetabolic metastatic disease. 5. Nodular 7 mm focus at the left lung base, below PET resolution,  recommend attention on follow-up chest CT in 3 months. 6. Chronic findings include: Aortic Atherosclerosis (ICD10-I70.0). Cholelithiasis.   02/08/2019 Cancer Staging   Staging form: Larynx - Supraglottis, AJCC 8th Edition - Clinical stage from 02/08/2019: Stage IVA (cT3, cN2c, cM0) - Signed by Lonie Peak, MD on 02/08/2019   02/11/2019 - 04/07/2019 Chemotherapy   The patient weekly cisplatin for chemotherapy treatment.     07/20/2019 Pathology Results   PROCEDURE: Total glossectomy, total pharyngectomy, total laryngectomy, total thyroidectomy, and cervical esophagectomy TUMOR SITE: Larynx TUMOR LATERALITY: Not specified TUMOR FOCALITY: Unifocal TUMOR SIZE:    GREATEST DIMENSION: 6.9 cm    ADDITIONAL DIMENSIONS: 5.4 x 2.4 cm HISTOLOGIC TYPE: Squamous cell carcinoma, conventional (keratinizing) HISTOLOGIC GRADE:  G1-G2: Well to moderately differentiated TUMOR EXTENSION: Tumor involves the posterior tongue, pharynx, larynx, tracheal cartilage, hyoid bone, cervical esophagus, vagus nerve, carotid artery, deep cervical fascia, ans surrounding skeletal muscle and soft tissue MARGINS: Uninvolved by invasive tumor; Perineural invasion is less than 1mm from the left soft tissue margin LYMPHOVASCULAR INVASION: Not identified PERINEURAL INVASION:  Present REGIONAL LYMPH NODES:     NUMBER OF LYMPH NODES INVOLVED: 2     NUMBER OF LYMPH NODES EXAMINED: 24     LATERALITY OF LYMPH NODES INVOLVED: Cannot determine     SIZE OF LARGEST METASTATIC DEPOSIT: 0.8 cm     EXTRANODAL EXTENSION: Not identified PATHOLOGIC STAGE CLASSIFICATION (pTNM, AJCC 8TH Ed): (r)(y) pT4b pN2a DISTANT METASTASIS (pM): ADDITIONAL PATHOLOGIC FINDINGS: Previous chemoradiation   11/04/2019 Imaging  PET: 1.  Hypermetabolic bilateral neck lesions as detailed above are concerning for disease recurrence. 2.  Compared to 07/08/2019 PET scan, interval development of subcentimeter bilateral upper lobe pulmonary nodules, size  below PET resolution. These are concerning for metastasis. Consider diagnostic quality CT chest exam for further evaluation as per oncology treatment planning. 3.  Focal hypermetabolic uptake in right ventricular cavity of indeterminate significance. Recommend echocardiographic examination to rule out underlying thrombus. This finding was discussed with Dr. Lendell Caprice by Dr. Ahmed Prima and Dr. Maisie Fus on 11/04/2019 at approximately 3:00 PM via telephone. 4.  Ancillary CT findings as above.   11/05/2019 Imaging   Echocardiogram: Summary   1. The left ventricle is normal in size with normal wall thickness.   2. The left ventricular systolic function is normal, LVEF is visually estimated at 60-65%.   3. The right ventricle is normal in size, with normal systolic function.   4. There is an extrinsic compression of the RV and RA laterally at the AV groove by a mass of 4.3 cm diameter. Unclear if the mass is invasive, although no pericardial effusion seen suggestive no invasion possibly. There is no evidence of hemodynamic compromise secondary to cardiac compression with normal IVC diameter.   12/06/2019 Pathology Results   A. LYMPH NODE, LEFT SUBMANDIBULAR, BIOPSY:  - Squamous cell carcinoma.  - No distinct nodal tissue identified.    12/06/2019 Procedure   Successful ultrasound-guided core biopsy of centrally necrotic left submandibular lymph node. Of note, due to extensive prior postsurgical and post radiation changes, patient has very limited mobility in the neck and the biopsy was extremely challenging    12/16/2019 -  Chemotherapy   The patient had pembrolizumab for chemotherapy treatment.     03/08/2020 PET scan   1. Diminished size of masses in the neck as described. Persistent increased metabolic activity particularly in the LEFT neck. Some of this diminished size on the LEFT could be due to decompression of necrotic material. Correlate with any symptoms of inflammation or infection in this area  as well given the paucity of residual soft tissue associated with the above abnormality, overall findings are compatible with residual disease in this location and there is no significant surrounding stranding to indicate inflammation that would correlate with the degree of FDG uptake remaining. 2. Diminished activity in the juxta cardiac mass with peripheral activity suggesting interval necrosis, on the prior study this was uniformly hypermetabolic. There is still considerable FDG uptake. 3. No signs of disease in the abdomen or pelvis. 4. 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 Correlate with any new pain or symptoms in this area with further imaging as warranted. No discrete mass or bone destruction on today's study.   06/08/2020 Imaging   CT neck 1. Status post laryngectomy and neck dissection without lymphadenopathy or mass lesion. 2. Short segment severe stenosis of the left internal carotid artery adjacent to a surgical clip.     06/08/2020 Imaging   CT chest 1. There is a large, hypodense lesion within, or very closely abutting and likely intruding upon the right ventricle, as seen on prior examination and approximately 4.6 x 4.3 cm, poorly evaluated due to cardiac motion. Findings are consistent with metastatic lesion and/or thrombus and poorly assessed by ungated CT. Consider echocardiography and cardiac MRI for further assessment.   2. Multiple small pulmonary nodules in the bilateral lung apices are unchanged. Attention on follow-up.   3.  No evidence of new metastatic disease in the chest.   4.  Aortic Atherosclerosis (ICD10-I70.0).     09/07/2020 Imaging   1. Mild decrease in size of juxta cardiac metastatic involving the right ventricle. 2. Small pulmonary nodules are unchanged from previous exam. 3. Aortic atherosclerosis. 4. Gallstone.     12/14/2020 Procedure   Successful exchange and up size from 20 French pull-through gastrostomy to 22 French balloon retention  gastrostomy.   03/30/2021 Imaging   Ct neck 1.  In comparison with PET/CT from February 2021, marked interval decrease in size of soft tissue mass along the angle of the left mandible consistent with a positive treatment response. However, there are new destructive bony changes along the lingual surface of the mandible in this region with appearance of soft tissue infiltrating the marrow cavity. While concerning for neoplastic involvement, changes could also represent other nonspecific osteonecrosis including infection or sequela of prior radiation treatment. Recommend correlation with direct visual inspection. Further evaluation with MRI may be of value.  2.  Centrally necrotic 1.3 cm right level IIa lymph node corresponding with previously described hypermetabolic lesion, decreased in size from prior. No suspicious new or enlarged lymph nodes in the neck.  3.  Enlarged right lower paratracheal lymph node suspected in the mediastinum, incompletely imaged. Consider dedicated CT imaging of the chest.  4.  Scattered pulmonary nodules in the imaged lung apices, the largest a 9 mm lesion in the right upper lobe. Recommend comparison with outside imaging ofthe chest if available.   05/03/2021 Echocardiogram    1. Left ventricular ejection fraction, by estimation, is 55 to 60%. The left ventricle has normal function. The left ventricle has no regional wall motion abnormalities. There is mild left ventricular hypertrophy. Left ventricular diastolic parameters are consistent with Grade I diastolic dysfunction (impaired relaxation).  2. Prominent echogenic structure on the RV free wall, possibly attached to the TV - measuring about 2.0 cm in diameter and 3.0 cm in length - some mobile filamentous material is attached, suggestive of thrombus or less likely tumor.. Right ventricular systolic function is normal. The right ventricular size is normal.  3. The mitral valve is grossly normal. Trivial mitral valve  regurgitation.  4. The aortic valve is tricuspid. Aortic valve regurgitation is not visualized.     05/04/2021 Imaging   RIGHT: - There is no evidence of deep vein thrombosis in the lower extremity.   - No cystic structure found in the popliteal fossa.   LEFT: - There is no evidence of deep vein thrombosis in the lower extremity.   - No cystic structure found in the popliteal fossa.     07/12/2021 Imaging   1. A large, heterogeneous mass about and within the right ventricle is difficult to clearly assess due to cardiac motion and mixing artifact on this non tailored examination, but suspect it has significantly increased in size, and now involves the right atrium in addition to the right ventricle and pulmonary outflow tract, measuring approximately 6.9 x 4.9 cm, previously 4.5 x 4.2 cm when measured similarly. This may reflect a combination of metastatic soft tissue and thrombus, and could be more clearly assessed by echocardiography and cardiac MRI. 2. No significant change of a right upper lobe pulmonary nodule, measuring 0.8 cm, which remains modestly suspicious for a metastasis. Other small nodules are likewise stable. Attention on follow-up. 3. Status post laryngectomy and tracheostomy. 4. Coronary artery disease.   07/24/2021 Echocardiogram    1. There is an echogenic RV and RVOT mass. Attachment appears to be on the right ventricular mid  wall. Mass is 32 by 24 mm, though may be underestimated. Right ventricular systolic function is normal. The right ventricular size is normal. There is normal pulmonary artery systolic pressure.  2. Left ventricular ejection fraction, by estimation, is 60 to 65%. The left ventricle has normal function. The left ventricle has no regional wall motion abnormalities. Left ventricular diastolic parameters are consistent with Grade I diastolic dysfunction (impaired relaxation). The average left ventricular global longitudinal strain is -19.0 %. The global  longitudinal strain is normal.  3. The mitral valve is normal in structure. No evidence of mitral valve regurgitation.  4. The aortic valve is tricuspid. Aortic valve regurgitation is not visualized. No aortic stenosis is present.   Comparison(s): A prior study was performed on 05/03/21. Prior images reviewed side by side. Similar size of RV mass to slight increase. Do not see right atrial involvement in this study.   Conclusion(s)/Recommendation(s): If concerned for tumor vs thombus, cardiac MRI could be considered   08/02/2021 Imaging   1. Large RV mass measuring 52mm x 48mm x 41mm. Mass is isointense to myocardium on T1 weighted imaging, does not suppress with fat saturation, isointense on T2 weighted imaging, perfuses on first pass perfusion, and heterogenous enhancement on LGE imaging. This is consistent with tumor, and in setting of known metastatic laryngeal cancer, is consistent with metastasis. There does appear to be thrombus adherent to tumor   2. Normal LV size and systolic function (EF 66%). No late gadolinium enhancement to suggest myocardial scar   3.  Normal RV size with mild systolic dysfunction (EF 41%)   08/17/2021 - 01/18/2022 Chemotherapy   Patient is on Treatment Plan : HEAD/NECK Cisplatin q7d     10/31/2021 Imaging   1. RV mass measures 50mm x 46mm x 39mm, similar to prior cardiac MRI on 08/01/21. As described on previous MRI, mass is isointense to myocardium on T1 and T2 weighted imaging, with heterogenous enhancement on LGE imaging. This is consistent with tumor, and in setting of known metastatic laryngeal cancer, is consistent with metastasis.   2. Normal LV size and systolic function (EF 68%). No LGE to suggest myocardial scar   3.  Normal RV size with low normal systolic function (EF 47%)   01/28/2022 Imaging   RV mass concerning for malignancy   Similar in size from 10/29/21.   Normal RV function in this study.   04/30/2022 Imaging   1. Heterogeneous mass of the  right ventricle is decreased in size when compared with most recent prior CT. 2. Stable mildly enlarged right lower paratracheal lymph node. 3. Stable solid pulmonary nodule of the right upper lobe measuring 8 mm. 4.  Aortic Atherosclerosis (ICD10-I70.0).   08/05/2022 Imaging   1. Stable examination demonstrating postoperative changes of laryngectomy and tracheostomy, with grossly stable size of treated metastatic lesion in the right ventricle of the heart. 2. Borderline enlarged low right paratracheal lymph node, stable compared to the prior study. 3. Small right upper lobe pulmonary nodule, stable compared to prior examination, favored to be benign. 4. Aortic atherosclerosis, in addition to left main and 2 vessel coronary artery disease. Assessment for potential risk factor modification, dietary therapy or pharmacologic therapy may be warranted, if clinically indicated.    11/25/2022 Imaging   Once again there is a heterogeneous mass identified along the right side of the heart the right ventricle. Dimensions today are measuring smaller but it is difficult to confirm change in size with the level of cardiac motion.  Stable prominent mediastinal lymph nodes.   Tracheostomy tube. There is wall thickening and stranding along the upper trachea distal to the tip of the cannula. Please correlate for any symptoms of tracheitis.   Right upper lobe small lung nodule is similar to minimally increased. Simple attention on follow-up for the patient's neoplasm.   Aortic Atherosclerosis (ICD10-I70.0).     PHYSICAL EXAMINATION: ECOG PERFORMANCE STATUS: 1 - Symptomatic but completely ambulatory  Vitals:   03/21/23 0826  BP: 113/79  Pulse: 71  Resp: 18  Temp: 97.7 F (36.5 C)  SpO2: 100%   Filed Weights   03/21/23 0826  Weight: 146 lb (66.2 kg)    GENERAL:alert, no distress and comfortable NEURO: alert & oriented, tracheostomy is not bleeding no focal motor/sensory deficits  LABORATORY  DATA:  I have reviewed the data as listed    Component Value Date/Time   NA 139 03/21/2023 0751   K 4.3 03/21/2023 0751   CL 103 03/21/2023 0751   CO2 29 03/21/2023 0751   GLUCOSE 91 03/21/2023 0751   BUN 24 (H) 03/21/2023 0751   CREATININE 0.92 03/21/2023 0751   CREATININE 0.96 02/01/2022 0838   CALCIUM 9.1 03/21/2023 0751   PROT 7.5 03/21/2023 0751   ALBUMIN 4.1 03/21/2023 0751   AST 28 03/21/2023 0751   AST 19 08/14/2021 0905   ALT 28 03/21/2023 0751   ALT 16 08/14/2021 0905   ALKPHOS 54 03/21/2023 0751   BILITOT 0.5 03/21/2023 0751   BILITOT 0.3 08/14/2021 0905   GFRNONAA >60 03/21/2023 0751   GFRNONAA >60 02/01/2022 0838   GFRAA >60 06/08/2020 1303   GFRAA >60 02/17/2020 1128    No results found for: "SPEP", "UPEP"  Lab Results  Component Value Date   WBC 4.7 03/21/2023   NEUTROABS 2.3 03/21/2023   HGB 13.8 03/21/2023   HCT 41.2 03/21/2023   MCV 88.4 03/21/2023   PLT 190 03/21/2023      Chemistry      Component Value Date/Time   NA 139 03/21/2023 0751   K 4.3 03/21/2023 0751   CL 103 03/21/2023 0751   CO2 29 03/21/2023 0751   BUN 24 (H) 03/21/2023 0751   CREATININE 0.92 03/21/2023 0751   CREATININE 0.96 02/01/2022 0838      Component Value Date/Time   CALCIUM 9.1 03/21/2023 0751   ALKPHOS 54 03/21/2023 0751   AST 28 03/21/2023 0751   AST 19 08/14/2021 0905   ALT 28 03/21/2023 0751   ALT 16 08/14/2021 0905   BILITOT 0.5 03/21/2023 0751   BILITOT 0.3 08/14/2021 0905

## 2023-04-01 DIAGNOSIS — Z1331 Encounter for screening for depression: Secondary | ICD-10-CM | POA: Diagnosis not present

## 2023-04-01 DIAGNOSIS — C7989 Secondary malignant neoplasm of other specified sites: Secondary | ICD-10-CM | POA: Diagnosis not present

## 2023-04-01 DIAGNOSIS — Z139 Encounter for screening, unspecified: Secondary | ICD-10-CM | POA: Diagnosis not present

## 2023-04-01 DIAGNOSIS — E892 Postprocedural hypoparathyroidism: Secondary | ICD-10-CM | POA: Diagnosis not present

## 2023-04-01 DIAGNOSIS — E782 Mixed hyperlipidemia: Secondary | ICD-10-CM | POA: Diagnosis not present

## 2023-04-01 DIAGNOSIS — C329 Malignant neoplasm of larynx, unspecified: Secondary | ICD-10-CM | POA: Diagnosis not present

## 2023-04-01 DIAGNOSIS — I700 Atherosclerosis of aorta: Secondary | ICD-10-CM | POA: Diagnosis not present

## 2023-04-01 DIAGNOSIS — E039 Hypothyroidism, unspecified: Secondary | ICD-10-CM | POA: Diagnosis not present

## 2023-04-01 DIAGNOSIS — Z930 Tracheostomy status: Secondary | ICD-10-CM | POA: Diagnosis not present

## 2023-04-01 DIAGNOSIS — Z9181 History of falling: Secondary | ICD-10-CM | POA: Diagnosis not present

## 2023-04-11 ENCOUNTER — Other Ambulatory Visit: Payer: Self-pay

## 2023-04-11 ENCOUNTER — Other Ambulatory Visit: Payer: Self-pay | Admitting: Hematology and Oncology

## 2023-04-11 DIAGNOSIS — C321 Malignant neoplasm of supraglottis: Secondary | ICD-10-CM

## 2023-04-11 MED ORDER — HYDROCODONE-ACETAMINOPHEN 5-325 MG PO TABS
1.0000 | ORAL_TABLET | Freq: Four times a day (QID) | ORAL | 0 refills | Status: DC | PRN
  Filled 2023-04-11: qty 60, 15d supply, fill #0

## 2023-04-12 ENCOUNTER — Encounter (HOSPITAL_COMMUNITY): Payer: Self-pay

## 2023-04-14 ENCOUNTER — Other Ambulatory Visit (HOSPITAL_COMMUNITY): Payer: Self-pay

## 2023-04-16 ENCOUNTER — Other Ambulatory Visit (HOSPITAL_COMMUNITY): Payer: Self-pay

## 2023-04-17 ENCOUNTER — Other Ambulatory Visit: Payer: Self-pay | Admitting: Hematology and Oncology

## 2023-04-17 ENCOUNTER — Encounter: Payer: Self-pay | Admitting: Hematology and Oncology

## 2023-04-17 DIAGNOSIS — C321 Malignant neoplasm of supraglottis: Secondary | ICD-10-CM

## 2023-04-17 DIAGNOSIS — Z931 Gastrostomy status: Secondary | ICD-10-CM

## 2023-04-17 DIAGNOSIS — E46 Unspecified protein-calorie malnutrition: Secondary | ICD-10-CM

## 2023-04-17 DIAGNOSIS — C329 Malignant neoplasm of larynx, unspecified: Secondary | ICD-10-CM

## 2023-04-17 MED ORDER — KATE FARMS PEPTIDE 1.5 PO LIQD: ORAL | 11 refills | Status: DC

## 2023-04-17 NOTE — Telephone Encounter (Addendum)
Received message from Geroge Baseman [phone] stating a new prescription is needed for Pt to receive her enteral supplement. [name] stated [facility] has the wrong dosage for the supplement and wants a new prescription sent to the pharmacy. Per [name] they have the supplements as [name] peptide at 1.0 but Pt is taking 1.5. TC to [phone] spoke with representative Elmarie Shiley ) who stated they received a letter of medical necessity 03/12/23 signed by Dr. Bertis Ruddy for Pt to receive 1.0 she also stated they are aware Pt is taking supplement orally. Refill request will be discussed with Dr. Mosetta Putt prescription printed out to be signed and reviewed by Dr Mosetta Putt.Reviewed with Dr Mosetta Putt prescription faxed to [facility] for 1.5 fax# [phone]

## 2023-04-18 ENCOUNTER — Telehealth: Payer: Self-pay | Admitting: Hematology and Oncology

## 2023-04-18 DIAGNOSIS — C321 Malignant neoplasm of supraglottis: Secondary | ICD-10-CM | POA: Diagnosis not present

## 2023-04-18 DIAGNOSIS — R633 Feeding difficulties, unspecified: Secondary | ICD-10-CM | POA: Diagnosis not present

## 2023-04-18 NOTE — Telephone Encounter (Signed)
Pt friend called to f/u on pt supplement that was ordered and faxed on 7/25. Called [facility] to f/u. Per Harvie Heck, "fax could still be sitting on fax machine and to advise pt to call in a couple days to confirm order is in." [place] to f/u with recommendations from company. Pt friend verbalized understanding.

## 2023-04-22 ENCOUNTER — Telehealth: Payer: Self-pay

## 2023-04-22 NOTE — Telephone Encounter (Signed)
Returned call to [place], she is asking about Rx to [facility] [employer] for tube feeding. Told the office will fax today. She verbalized understanding. Faxed order and clinical notes to [facility] at [phone], received fax confirmation.

## 2023-05-13 ENCOUNTER — Other Ambulatory Visit: Payer: Self-pay | Admitting: Hematology and Oncology

## 2023-05-13 ENCOUNTER — Other Ambulatory Visit: Payer: Self-pay

## 2023-05-13 ENCOUNTER — Encounter (HOSPITAL_COMMUNITY): Payer: Self-pay

## 2023-05-13 ENCOUNTER — Other Ambulatory Visit (HOSPITAL_COMMUNITY): Payer: Self-pay

## 2023-05-13 DIAGNOSIS — C321 Malignant neoplasm of supraglottis: Secondary | ICD-10-CM

## 2023-05-13 MED ORDER — HYDROCODONE-ACETAMINOPHEN 5-325 MG PO TABS
1.0000 | ORAL_TABLET | Freq: Four times a day (QID) | ORAL | 0 refills | Status: DC | PRN
  Filled 2023-05-13: qty 60, 15d supply, fill #0

## 2023-05-14 ENCOUNTER — Other Ambulatory Visit (HOSPITAL_COMMUNITY): Payer: Self-pay

## 2023-05-16 ENCOUNTER — Ambulatory Visit (HOSPITAL_COMMUNITY)
Admission: RE | Admit: 2023-05-16 | Discharge: 2023-05-16 | Disposition: A | Discharge status: Home / Self Care | Payer: Medicare HMO | Source: Ambulatory Visit | Attending: Hematology and Oncology | Admitting: Hematology and Oncology

## 2023-05-16 ENCOUNTER — Inpatient Hospital Stay: Payer: Medicare HMO | Attending: Hematology and Oncology

## 2023-05-16 DIAGNOSIS — Z923 Personal history of irradiation: Secondary | ICD-10-CM | POA: Insufficient documentation

## 2023-05-16 DIAGNOSIS — C329 Malignant neoplasm of larynx, unspecified: Secondary | ICD-10-CM | POA: Insufficient documentation

## 2023-05-16 DIAGNOSIS — R911 Solitary pulmonary nodule: Secondary | ICD-10-CM | POA: Diagnosis not present

## 2023-05-16 DIAGNOSIS — E039 Hypothyroidism, unspecified: Secondary | ICD-10-CM

## 2023-05-16 DIAGNOSIS — C7800 Secondary malignant neoplasm of unspecified lung: Secondary | ICD-10-CM | POA: Diagnosis not present

## 2023-05-16 DIAGNOSIS — Z9221 Personal history of antineoplastic chemotherapy: Secondary | ICD-10-CM | POA: Insufficient documentation

## 2023-05-16 DIAGNOSIS — Z95828 Presence of other vascular implants and grafts: Secondary | ICD-10-CM

## 2023-05-16 DIAGNOSIS — G893 Neoplasm related pain (acute) (chronic): Secondary | ICD-10-CM | POA: Insufficient documentation

## 2023-05-16 DIAGNOSIS — C321 Malignant neoplasm of supraglottis: Secondary | ICD-10-CM | POA: Insufficient documentation

## 2023-05-16 DIAGNOSIS — I700 Atherosclerosis of aorta: Secondary | ICD-10-CM | POA: Diagnosis not present

## 2023-05-16 DIAGNOSIS — E890 Postprocedural hypothyroidism: Secondary | ICD-10-CM | POA: Insufficient documentation

## 2023-05-16 LAB — CBC WITH DIFFERENTIAL/PLATELET
Abs Immature Granulocytes: 0.01 10*3/uL (ref 0.00–0.07)
Basophils Absolute: 0 10*3/uL (ref 0.0–0.1)
Basophils Relative: 0 %
Eosinophils Absolute: 0.3 10*3/uL (ref 0.0–0.5)
Eosinophils Relative: 9 %
HCT: 40.1 % (ref 36.0–46.0)
Hemoglobin: 13.1 g/dL (ref 12.0–15.0)
Immature Granulocytes: 0 %
Lymphocytes Relative: 29 %
Lymphs Abs: 1.1 10*3/uL (ref 0.7–4.0)
MCH: 29 pg (ref 26.0–34.0)
MCHC: 32.7 g/dL (ref 30.0–36.0)
MCV: 88.9 fL (ref 80.0–100.0)
Monocytes Absolute: 0.4 10*3/uL (ref 0.1–1.0)
Monocytes Relative: 11 %
Neutro Abs: 1.9 10*3/uL (ref 1.7–7.7)
Neutrophils Relative %: 51 %
Platelets: 181 10*3/uL (ref 150–400)
RBC: 4.51 MIL/uL (ref 3.87–5.11)
RDW: 13.2 % (ref 11.5–15.5)
WBC: 3.8 10*3/uL — ABNORMAL LOW (ref 4.0–10.5)
nRBC: 0 % (ref 0.0–0.2)

## 2023-05-16 LAB — COMPREHENSIVE METABOLIC PANEL
ALT: 23 U/L (ref 0–44)
AST: 27 U/L (ref 15–41)
Albumin: 4.2 g/dL (ref 3.5–5.0)
Alkaline Phosphatase: 57 U/L (ref 38–126)
Anion gap: 8 (ref 5–15)
BUN: 28 mg/dL — ABNORMAL HIGH (ref 8–23)
CO2: 28 mmol/L (ref 22–32)
Calcium: 8.9 mg/dL (ref 8.9–10.3)
Chloride: 103 mmol/L (ref 98–111)
Creatinine, Ser: 1 mg/dL (ref 0.44–1.00)
GFR, Estimated: 60 mL/min — ABNORMAL LOW (ref >60)
Glucose, Bld: 97 mg/dL (ref 70–99)
Potassium: 4.2 mmol/L (ref 3.5–5.1)
Sodium: 139 mmol/L (ref 135–145)
Total Bilirubin: 0.5 mg/dL (ref 0.3–1.2)
Total Protein: 7.2 g/dL (ref 6.5–8.1)

## 2023-05-16 LAB — TSH: TSH: 2.155 u[IU]/mL (ref 0.350–4.500)

## 2023-05-16 MED ORDER — SODIUM CHLORIDE 0.9% FLUSH
10.0000 mL | Freq: Once | INTRAVENOUS | Status: AC
  Administered 2023-05-16: 10 mL

## 2023-05-16 MED ORDER — IOHEXOL 300 MG/ML  SOLN
75.0000 mL | Freq: Once | INTRAMUSCULAR | Status: AC | PRN
  Administered 2023-05-16: 75 mL via INTRAVENOUS

## 2023-05-16 MED ORDER — HEPARIN SOD (PORK) LOCK FLUSH 100 UNIT/ML IV SOLN: 500.0000 [IU] | Freq: Once | INTRAVENOUS | Status: DC

## 2023-05-16 MED ORDER — HEPARIN SOD (PORK) LOCK FLUSH 100 UNIT/ML IV SOLN
INTRAVENOUS | Status: AC
  Filled 2023-05-16: qty 5

## 2023-05-16 MED ORDER — SODIUM CHLORIDE (PF) 0.9 % IJ SOLN
INTRAMUSCULAR | Status: AC
  Filled 2023-05-16: qty 50

## 2023-05-23 ENCOUNTER — Inpatient Hospital Stay: Payer: Medicare HMO | Admitting: Hematology and Oncology

## 2023-05-23 ENCOUNTER — Encounter: Payer: Self-pay | Admitting: Hematology and Oncology

## 2023-05-23 VITALS — BP 119/75 | HR 78 | Temp 97.8°F | Resp 18 | Ht 62.0 in | Wt 146.2 lb

## 2023-05-23 DIAGNOSIS — Z923 Personal history of irradiation: Secondary | ICD-10-CM | POA: Diagnosis not present

## 2023-05-23 DIAGNOSIS — C7800 Secondary malignant neoplasm of unspecified lung: Secondary | ICD-10-CM | POA: Diagnosis not present

## 2023-05-23 DIAGNOSIS — C321 Malignant neoplasm of supraglottis: Secondary | ICD-10-CM | POA: Diagnosis not present

## 2023-05-23 DIAGNOSIS — G893 Neoplasm related pain (acute) (chronic): Secondary | ICD-10-CM

## 2023-05-23 DIAGNOSIS — C329 Malignant neoplasm of larynx, unspecified: Secondary | ICD-10-CM

## 2023-05-23 DIAGNOSIS — Z9221 Personal history of antineoplastic chemotherapy: Secondary | ICD-10-CM | POA: Diagnosis not present

## 2023-05-23 NOTE — Assessment & Plan Note (Signed)
There is no evidence of lung metastasis.  Observe closely

## 2023-05-23 NOTE — Assessment & Plan Note (Signed)
She has stable pain control She will continue the same medications for pain

## 2023-05-23 NOTE — Progress Notes (Signed)
[facility] OFFICE PROGRESS NOTE  Patient Care Team: Hamrick, Durward Fortes, MD as PCP - General (Family Medicine) Artis Delay, MD as Consulting Physician (Hematology and Oncology)  ASSESSMENT & PLAN:  Laryngeal squamous cell carcinoma (HCC) Her last Ct imaging in August  showed no signs of disease progression She has been off treatment for a year I plan to space out interval imaging study to every 6 months I plan to repeat imaging study around February 2025 We will continue port maintence flushes and close monitoring of her blood work in 2 months  Metastasis to lung [facility]) There is no evidence of lung metastasis.  Observe closely  Cancer associated pain She has stable pain control She will continue the same medications for pain  Orders Placed This Encounter  Procedures   CT CHEST W CONTRAST    Standing Status:   Future    Standing Expiration Date:   05/22/2024    Order Specific Question:   If indicated for the ordered procedure, I authorize the administration of contrast media per Radiology protocol    Answer:   Yes    Order Specific Question:   Preferred imaging location?    Answer:   [facility]    Order Specific Question:   Radiology Contrast Protocol - do NOT remove file path    Answer:   [email or web address]    All questions were answered. The patient knows to call the clinic with any problems, questions or concerns. The total time spent in the appointment was 30 minutes encounter with patients including review of chart and various tests results, discussions about plan of care and coordination of care plan   Artis Delay, MD 05/23/2023 12:05 PM  INTERVAL HISTORY: Please see below for problem oriented charting. she returns for surveillance follow-up  She is here with her son We communicated through writing She is doing well.  Her pain is reasonably controlled.  We discussed and reviewed imaging studies and future plan  of care  REVIEW OF SYSTEMS:   Constitutional: Denies fevers, chills or abnormal weight loss Eyes: Denies blurriness of vision Ears, nose, mouth, throat, and face: Denies mucositis or sore throat Respiratory: Denies cough, dyspnea or wheezes Cardiovascular: Denies palpitation, chest discomfort or lower extremity swelling Gastrointestinal:  Denies nausea, heartburn or change in bowel habits Skin: Denies abnormal skin rashes Lymphatics: Denies new lymphadenopathy or easy bruising Neurological:Denies numbness, tingling or new weaknesses Behavioral/Psych: Mood is stable, no new changes  All other systems were reviewed with the patient and are negative.  I have reviewed the past medical history, past surgical history, social history and family history with the patient and they are unchanged from previous note.  ALLERGIES:  has no active allergies.  MEDICATIONS:  Current Outpatient Medications  Medication Sig Dispense Refill   apixaban (ELIQUIS) 2.5 MG TABS tablet Take 1 tablet (2.5 mg total) by mouth 2 (two) times daily. 60 tablet 3   Cholecalciferol (VITAMIN D3) 125 MCG (5000 UT) CAPS Take 5,000 Units by mouth daily.     HYDROcodone-acetaminophen (NORCO/VICODIN) 5-325 MG tablet Take 1 tablet by mouth every 6 (six) hours as needed for moderate pain. 60 tablet 0   levothyroxine (SYNTHROID) 125 MCG tablet Take 1 tablet (125 mcg total) by mouth daily before breakfast. 30 tablet 6   lidocaine-prilocaine (EMLA) cream Apply topically daily as needed. 30 g 9   Nutritional Supplements (KATE FARMS PEPTIDE 1.5) LIQD 3 bottles daily 325 mL 11   oxymetazoline (AFRIN) 0.05 % nasal  spray Place 3 sprays into both nostrils 2 (two) times daily as needed (Bleeding from stoma). 30 mL 0   rosuvastatin (CRESTOR) 10 MG tablet Take 10 mg by mouth daily.     No current facility-administered medications for this visit.    SUMMARY OF ONCOLOGIC HISTORY: Oncology History Overview Note  Progressed on  pembrolizumab   Laryngeal squamous cell carcinoma (HCC)  12/02/2018 Imaging   CT neck: IMPRESSION: 1. Supraglottic laryngeal carcinoma extending across the anterior commissure and invading the prelaryngeal fat, left aryepiglottic fold and left aspect of the epiglottis. 2. Severe narrowing of the laryngeal airway. 3. Bilateral subcentimeter level 2A and 2A cervical lymph nodes.     12/02/2018 Imaging   CT  Chest:  IMPRESSION: No evidence of metastatic disease in the chest.   12/03/2018 Pathology Results   (Care Everywhere; [facility]) A: Larynx, supraglottis, biopsy - Atypical squamous proliferation with at least low grade dysplasia and abundant keratinization - No definite high grade dysplasia or invasive carcinoma represented (see comment)   12/18/2018 Pathology Results   (Care Everywhere; [facility]) A:  Neck, left, fine needle aspiration - Rare single atypical cells, no cells diagnostic of malignancy - Lymph node material present  B:  Neck, right, fine needle aspiration - Non-diagnostic specimen - No lymph node material identified - Mixed inflammation and blood   12/31/2018 Pathology Results   (Care Everywhere; [facility])  A: Larynx, left supraglottic mass, biopsy - Atypical squamous proliferation with abundant keratinization, suspicious for well-differentiated invasive squamous cell carcinoma  B: Larynx, left supraglottic mass, biopsy - Well-differentiated invasive squamous cell carcinoma with abundant keratinization (see comment)   01/18/2019 Initial Diagnosis   Laryngeal cancer (HCC)   01/26/2019 Imaging   1. Locally advanced hypermetabolic laryngeal neoplasm centered in the left supraglottic space with bilateral glottic and epiglottic involvement and subglottic extension on the left. 2. Hypermetabolic bilateral level 3 neck nodal metastases. 3. Hypermetabolic high mediastinal nodal metastases between the trachea and upper thoracic esophagus. 4. Otherwise no  distant hypermetabolic metastatic disease. 5. Nodular 7 mm focus at the left lung base, below PET resolution, recommend attention on follow-up chest CT in 3 months. 6. Chronic findings include: Aortic Atherosclerosis (ICD10-I70.0). Cholelithiasis.   02/08/2019 Cancer Staging   Staging form: Larynx - Supraglottis, AJCC 8th Edition - Clinical stage from 02/08/2019: Stage IVA (cT3, cN2c, cM0) - Signed by Lonie Peak, MD on 02/08/2019   02/11/2019 - 04/07/2019 Chemotherapy   The patient weekly cisplatin for chemotherapy treatment.     07/20/2019 Pathology Results   PROCEDURE: Total glossectomy, total pharyngectomy, total laryngectomy, total thyroidectomy, and cervical esophagectomy TUMOR SITE: Larynx TUMOR LATERALITY: Not specified TUMOR FOCALITY: Unifocal TUMOR SIZE:    GREATEST DIMENSION: 6.9 cm    ADDITIONAL DIMENSIONS: 5.4 x 2.4 cm HISTOLOGIC TYPE: Squamous cell carcinoma, conventional (keratinizing) HISTOLOGIC GRADE:  G1-G2: Well to moderately differentiated TUMOR EXTENSION: Tumor involves the posterior tongue, pharynx, larynx, tracheal cartilage, hyoid bone, cervical esophagus, vagus nerve, carotid artery, deep cervical fascia, ans surrounding skeletal muscle and soft tissue MARGINS: Uninvolved by invasive tumor; Perineural invasion is less than 1mm from the left soft tissue margin LYMPHOVASCULAR INVASION: Not identified PERINEURAL INVASION:  Present REGIONAL LYMPH NODES:     NUMBER OF LYMPH NODES INVOLVED: 2     NUMBER OF LYMPH NODES EXAMINED: 24     LATERALITY OF LYMPH NODES INVOLVED: Cannot determine     SIZE OF LARGEST METASTATIC DEPOSIT: 0.8 cm     EXTRANODAL EXTENSION: Not identified PATHOLOGIC STAGE  CLASSIFICATION (pTNM, AJCC 8TH Ed): (r)(y) pT4b pN2a DISTANT METASTASIS (pM): ADDITIONAL PATHOLOGIC FINDINGS: Previous chemoradiation   11/04/2019 Imaging   PET: 1.  Hypermetabolic bilateral neck lesions as detailed above are concerning for disease recurrence. 2.   Compared to 07/08/2019 PET scan, interval development of subcentimeter bilateral upper lobe pulmonary nodules, size below PET resolution. These are concerning for metastasis. Consider diagnostic quality CT chest exam for further evaluation as per oncology treatment planning. 3.  Focal hypermetabolic uptake in right ventricular cavity of indeterminate significance. Recommend echocardiographic examination to rule out underlying thrombus. This finding was discussed with Dr. Lendell Caprice by Dr. Ahmed Prima and Dr. Maisie Fus on 11/04/2019 at approximately 3:00 PM via telephone. 4.  Ancillary CT findings as above.   11/05/2019 Imaging   Echocardiogram: Summary   1. The left ventricle is normal in size with normal wall thickness.   2. The left ventricular systolic function is normal, LVEF is visually estimated at 60-65%.   3. The right ventricle is normal in size, with normal systolic function.   4. There is an extrinsic compression of the RV and RA laterally at the AV groove by a mass of 4.3 cm diameter. Unclear if the mass is invasive, although no pericardial effusion seen suggestive no invasion possibly. There is no evidence of hemodynamic compromise secondary to cardiac compression with normal IVC diameter.   12/06/2019 Pathology Results   A. LYMPH NODE, LEFT SUBMANDIBULAR, BIOPSY:  - Squamous cell carcinoma.  - No distinct nodal tissue identified.    12/06/2019 Procedure   Successful ultrasound-guided core biopsy of centrally necrotic left submandibular lymph node. Of note, due to extensive prior postsurgical and post radiation changes, patient has very limited mobility in the neck and the biopsy was extremely challenging    12/16/2019 -  Chemotherapy   The patient had pembrolizumab for chemotherapy treatment.     03/08/2020 PET scan   1. Diminished size of masses in the neck as described. Persistent increased metabolic activity particularly in the LEFT neck. Some of this diminished size on the LEFT could  be due to decompression of necrotic material. Correlate with any symptoms of inflammation or infection in this area as well given the paucity of residual soft tissue associated with the above abnormality, overall findings are compatible with residual disease in this location and there is no significant surrounding stranding to indicate inflammation that would correlate with the degree of FDG uptake remaining. 2. Diminished activity in the juxta cardiac mass with peripheral activity suggesting interval necrosis, on the prior study this was uniformly hypermetabolic. There is still considerable FDG uptake. 3. No signs of disease in the abdomen or pelvis. 4. 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 Correlate with any new pain or symptoms in this area with further imaging as warranted. No discrete mass or bone destruction on today's study.   06/08/2020 Imaging   CT neck 1. Status post laryngectomy and neck dissection without lymphadenopathy or mass lesion. 2. Short segment severe stenosis of the left internal carotid artery adjacent to a surgical clip.     06/08/2020 Imaging   CT chest 1. There is a large, hypodense lesion within, or very closely abutting and likely intruding upon the right ventricle, as seen on prior examination and approximately 4.6 x 4.3 cm, poorly evaluated due to cardiac motion. Findings are consistent with metastatic lesion and/or thrombus and poorly assessed by ungated CT. Consider echocardiography and cardiac MRI for further assessment.   2. Multiple small pulmonary nodules in the bilateral lung  apices are unchanged. Attention on follow-up.   3.  No evidence of new metastatic disease in the chest.   4.  Aortic Atherosclerosis (ICD10-I70.0).     09/07/2020 Imaging   1. Mild decrease in size of juxta cardiac metastatic involving the right ventricle. 2. Small pulmonary nodules are unchanged from previous exam. 3. Aortic atherosclerosis. 4. Gallstone.     12/14/2020  Procedure   Successful exchange and up size from 20 French pull-through gastrostomy to 22 French balloon retention gastrostomy.   03/30/2021 Imaging   Ct neck 1.  In comparison with PET/CT from February 2021, marked interval decrease in size of soft tissue mass along the angle of the left mandible consistent with a positive treatment response. However, there are new destructive bony changes along the lingual surface of the mandible in this region with appearance of soft tissue infiltrating the marrow cavity. While concerning for neoplastic involvement, changes could also represent other nonspecific osteonecrosis including infection or sequela of prior radiation treatment. Recommend correlation with direct visual inspection. Further evaluation with MRI may be of value.  2.  Centrally necrotic 1.3 cm right level IIa lymph node corresponding with previously described hypermetabolic lesion, decreased in size from prior. No suspicious new or enlarged lymph nodes in the neck.  3.  Enlarged right lower paratracheal lymph node suspected in the mediastinum, incompletely imaged. Consider dedicated CT imaging of the chest.  4.  Scattered pulmonary nodules in the imaged lung apices, the largest a 9 mm lesion in the right upper lobe. Recommend comparison with outside imaging ofthe chest if available.   05/03/2021 Echocardiogram    1. Left ventricular ejection fraction, by estimation, is 55 to 60%. The left ventricle has normal function. The left ventricle has no regional wall motion abnormalities. There is mild left ventricular hypertrophy. Left ventricular diastolic parameters are consistent with Grade I diastolic dysfunction (impaired relaxation).  2. Prominent echogenic structure on the RV free wall, possibly attached to the TV - measuring about 2.0 cm in diameter and 3.0 cm in length - some mobile filamentous material is attached, suggestive of thrombus or less likely tumor.. Right ventricular systolic function is  normal. The right ventricular size is normal.  3. The mitral valve is grossly normal. Trivial mitral valve regurgitation.  4. The aortic valve is tricuspid. Aortic valve regurgitation is not visualized.     05/04/2021 Imaging   RIGHT: - There is no evidence of deep vein thrombosis in the lower extremity.   - No cystic structure found in the popliteal fossa.   LEFT: - There is no evidence of deep vein thrombosis in the lower extremity.   - No cystic structure found in the popliteal fossa.     07/12/2021 Imaging   1. A large, heterogeneous mass about and within the right ventricle is difficult to clearly assess due to cardiac motion and mixing artifact on this non tailored examination, but suspect it has significantly increased in size, and now involves the right atrium in addition to the right ventricle and pulmonary outflow tract, measuring approximately 6.9 x 4.9 cm, previously 4.5 x 4.2 cm when measured similarly. This may reflect a combination of metastatic soft tissue and thrombus, and could be more clearly assessed by echocardiography and cardiac MRI. 2. No significant change of a right upper lobe pulmonary nodule, measuring 0.8 cm, which remains modestly suspicious for a metastasis. Other small nodules are likewise stable. Attention on follow-up. 3. Status post laryngectomy and tracheostomy. 4. Coronary artery disease.  07/24/2021 Echocardiogram    1. There is an echogenic RV and RVOT mass. Attachment appears to be on the right ventricular mid wall. Mass is 32 by 24 mm, though may be underestimated. Right ventricular systolic function is normal. The right ventricular size is normal. There is normal pulmonary artery systolic pressure.  2. Left ventricular ejection fraction, by estimation, is 60 to 65%. The left ventricle has normal function. The left ventricle has no regional wall motion abnormalities. Left ventricular diastolic parameters are consistent with Grade I diastolic  dysfunction (impaired relaxation). The average left ventricular global longitudinal strain is -19.0 %. The global longitudinal strain is normal.  3. The mitral valve is normal in structure. No evidence of mitral valve regurgitation.  4. The aortic valve is tricuspid. Aortic valve regurgitation is not visualized. No aortic stenosis is present.   Comparison(s): A prior study was performed on 05/03/21. Prior images reviewed side by side. Similar size of RV mass to slight increase. Do not see right atrial involvement in this study.   Conclusion(s)/Recommendation(s): If concerned for tumor vs thombus, cardiac MRI could be considered   08/02/2021 Imaging   1. Large RV mass measuring 52mm x 48mm x 41mm. Mass is isointense to myocardium on T1 weighted imaging, does not suppress with fat saturation, isointense on T2 weighted imaging, perfuses on first pass perfusion, and heterogenous enhancement on LGE imaging. This is consistent with tumor, and in setting of known metastatic laryngeal cancer, is consistent with metastasis. There does appear to be thrombus adherent to tumor   2. Normal LV size and systolic function (EF 66%). No late gadolinium enhancement to suggest myocardial scar   3.  Normal RV size with mild systolic dysfunction (EF 41%)   08/17/2021 - 01/18/2022 Chemotherapy   Patient is on Treatment Plan : HEAD/NECK Cisplatin q7d     10/31/2021 Imaging   1. RV mass measures 50mm x 46mm x 39mm, similar to prior cardiac MRI on 08/01/21. As described on previous MRI, mass is isointense to myocardium on T1 and T2 weighted imaging, with heterogenous enhancement on LGE imaging. This is consistent with tumor, and in setting of known metastatic laryngeal cancer, is consistent with metastasis.   2. Normal LV size and systolic function (EF 68%). No LGE to suggest myocardial scar   3.  Normal RV size with low normal systolic function (EF 47%)   01/28/2022 Imaging   RV mass concerning for malignancy   Similar  in size from 10/29/21.   Normal RV function in this study.   04/30/2022 Imaging   1. Heterogeneous mass of the right ventricle is decreased in size when compared with most recent prior CT. 2. Stable mildly enlarged right lower paratracheal lymph node. 3. Stable solid pulmonary nodule of the right upper lobe measuring 8 mm. 4.  Aortic Atherosclerosis (ICD10-I70.0).   08/05/2022 Imaging   1. Stable examination demonstrating postoperative changes of laryngectomy and tracheostomy, with grossly stable size of treated metastatic lesion in the right ventricle of the heart. 2. Borderline enlarged low right paratracheal lymph node, stable compared to the prior study. 3. Small right upper lobe pulmonary nodule, stable compared to prior examination, favored to be benign. 4. Aortic atherosclerosis, in addition to left main and 2 vessel coronary artery disease. Assessment for potential risk factor modification, dietary therapy or pharmacologic therapy may be warranted, if clinically indicated.    11/25/2022 Imaging   Once again there is a heterogeneous mass identified along the right side of the heart the right  ventricle. Dimensions today are measuring smaller but it is difficult to confirm change in size with the level of cardiac motion.   Stable prominent mediastinal lymph nodes.   Tracheostomy tube. There is wall thickening and stranding along the upper trachea distal to the tip of the cannula. Please correlate for any symptoms of tracheitis.   Right upper lobe small lung nodule is similar to minimally increased. Simple attention on follow-up for the patient's neoplasm.   Aortic Atherosclerosis (ICD10-I70.0).   05/22/2023 Imaging   CT CHEST W CONTRAST  Result Date: 05/22/2023 CLINICAL DATA:  History of head and neck cancer (laryngeal squamous cell carcinoma). Follow up cardiac mass. * Tracking Code: BO * EXAM: CT CHEST WITH CONTRAST TECHNIQUE: Multidetector CT imaging of the chest was performed during  intravenous contrast administration. RADIATION DOSE REDUCTION: This exam was performed according to the departmental dose-optimization program which includes automated exposure control, adjustment of the mA and/or kV according to patient size and/or use of iterative reconstruction technique. CONTRAST:  75mL OMNIPAQUE IOHEXOL 300 MG/ML  SOLN COMPARISON:  Chest CT 11/22/2022 and 08/02/2022. Cardiac MRI 01/25/2022. FINDINGS: Cardiovascular: Right IJ Port-A-Cath extends to the upper right atrium. No acute vascular findings are demonstrated. There is atherosclerosis of the aorta, great vessels and coronary arteries. The heart size is normal. There is no pericardial effusion. The known mass along the anterior aspect of the right ventricle is not well differentiated from contrast in the adjacent ventricle on this nongated examination, although is grossly unchanged. Mediastinum/Nodes: There are stable small mediastinal lymph nodes, including a 9 mm short axis precarinal node on image 56/2. No enlarging mediastinal, hilar or axillary lymph nodes are identified. There are stable postsurgical changes in the neck related to previous laryngectomy and tracheostomy placement. The esophagus appears unremarkable. Lungs/Pleura: No pleural effusion or pneumothorax. Stable subpleural fibrosis anteriorly in both upper lobes attributed to prior radiation therapy. No significant change in part solid right upper lobe nodule measuring approximately 7 x 6 mm on image 44/6. No new or enlarging nodules are identified. Upper abdomen: The visualized upper abdomen appears stable, without significant findings. Musculoskeletal/Chest wall: There is no chest wall mass or suspicious osseous finding. Stable multilevel spondylosis. Unless specific follow-up recommendations are mentioned in the findings or impression sections, no imaging follow-up of any mentioned incidental findings is recommended. IMPRESSION: 1. Stable chest CT. No evidence of local  recurrence or metastatic disease. 2. The known mass along the anterior aspect of the right ventricle is not well differentiated from contrast in the adjacent ventricle on this nongated examination, although is grossly unchanged. This has been present for some time and appeared hypermetabolic on outside PET-CT performed 11/04/2019. Stability suggests a treated lesion. This may be better evaluated with follow-up cardiac MR or PET-CT if clinically warranted. 3. Stable part solid right upper lobe pulmonary nodule, again favored to be benign based on stability. 4. Stable postsurgical changes in the neck related to previous laryngectomy and tracheostomy placement. 5.  Aortic Atherosclerosis (ICD10-I70.0). Electronically Signed   By: Carey Bullocks M.D.   On: 05/22/2023 16:31        PHYSICAL EXAMINATION: ECOG PERFORMANCE STATUS: 1 - Symptomatic but completely ambulatory  Vitals:   05/23/23 0813  BP: 119/75  Pulse: 78  Resp: 18  Temp: 97.8 F (36.6 C)  SpO2: 99%   Filed Weights   05/23/23 0813  Weight: 146 lb 3.2 oz (66.3 kg)    GENERAL:alert, no distress and comfortable NECK tracheostomy in situ with signs of fibrosis  LABORATORY DATA:  I have reviewed the data as listed    Component Value Date/Time   NA 139 05/16/2023 0802   K 4.2 05/16/2023 0802   CL 103 05/16/2023 0802   CO2 28 05/16/2023 0802   GLUCOSE 97 05/16/2023 0802   BUN 28 (H) 05/16/2023 0802   CREATININE 1.00 05/16/2023 0802   CREATININE 0.96 02/01/2022 0838   CALCIUM 8.9 05/16/2023 0802   PROT 7.2 05/16/2023 0802   ALBUMIN 4.2 05/16/2023 0802   AST 27 05/16/2023 0802   AST 19 08/14/2021 0905   ALT 23 05/16/2023 0802   ALT 16 08/14/2021 0905   ALKPHOS 57 05/16/2023 0802   BILITOT 0.5 05/16/2023 0802   BILITOT 0.3 08/14/2021 0905   GFRNONAA 60 (L) 05/16/2023 0802   GFRNONAA >60 02/01/2022 0838   GFRAA >60 06/08/2020 1303   GFRAA >60 02/17/2020 1128    No results found for: "SPEP", "UPEP"  Lab Results   Component Value Date   WBC 3.8 (L) 05/16/2023   NEUTROABS 1.9 05/16/2023   HGB 13.1 05/16/2023   HCT 40.1 05/16/2023   MCV 88.9 05/16/2023   PLT 181 05/16/2023      Chemistry      Component Value Date/Time   NA 139 05/16/2023 0802   K 4.2 05/16/2023 0802   CL 103 05/16/2023 0802   CO2 28 05/16/2023 0802   BUN 28 (H) 05/16/2023 0802   CREATININE 1.00 05/16/2023 0802   CREATININE 0.96 02/01/2022 0838      Component Value Date/Time   CALCIUM 8.9 05/16/2023 0802   ALKPHOS 57 05/16/2023 0802   AST 27 05/16/2023 0802   AST 19 08/14/2021 0905   ALT 23 05/16/2023 0802   ALT 16 08/14/2021 0905   BILITOT 0.5 05/16/2023 0802   BILITOT 0.3 08/14/2021 0905       RADIOGRAPHIC STUDIES: I have reviewed imaging study with the patient I have personally reviewed the radiological images as listed and agreed with the findings in the report. CT CHEST W CONTRAST  Result Date: 05/22/2023 CLINICAL DATA:  History of head and neck cancer (laryngeal squamous cell carcinoma). Follow up cardiac mass. * Tracking Code: BO * EXAM: CT CHEST WITH CONTRAST TECHNIQUE: Multidetector CT imaging of the chest was performed during intravenous contrast administration. RADIATION DOSE REDUCTION: This exam was performed according to the departmental dose-optimization program which includes automated exposure control, adjustment of the mA and/or kV according to patient size and/or use of iterative reconstruction technique. CONTRAST:  75mL OMNIPAQUE IOHEXOL 300 MG/ML  SOLN COMPARISON:  Chest CT 11/22/2022 and 08/02/2022. Cardiac MRI 01/25/2022. FINDINGS: Cardiovascular: Right IJ Port-A-Cath extends to the upper right atrium. No acute vascular findings are demonstrated. There is atherosclerosis of the aorta, great vessels and coronary arteries. The heart size is normal. There is no pericardial effusion. The known mass along the anterior aspect of the right ventricle is not well differentiated from contrast in the adjacent  ventricle on this nongated examination, although is grossly unchanged. Mediastinum/Nodes: There are stable small mediastinal lymph nodes, including a 9 mm short axis precarinal node on image 56/2. No enlarging mediastinal, hilar or axillary lymph nodes are identified. There are stable postsurgical changes in the neck related to previous laryngectomy and tracheostomy placement. The esophagus appears unremarkable. Lungs/Pleura: No pleural effusion or pneumothorax. Stable subpleural fibrosis anteriorly in both upper lobes attributed to prior radiation therapy. No significant change in part solid right upper lobe nodule measuring approximately 7 x 6 mm on image 44/6. No  new or enlarging nodules are identified. Upper abdomen: The visualized upper abdomen appears stable, without significant findings. Musculoskeletal/Chest wall: There is no chest wall mass or suspicious osseous finding. Stable multilevel spondylosis. Unless specific follow-up recommendations are mentioned in the findings or impression sections, no imaging follow-up of any mentioned incidental findings is recommended. IMPRESSION: 1. Stable chest CT. No evidence of local recurrence or metastatic disease. 2. The known mass along the anterior aspect of the right ventricle is not well differentiated from contrast in the adjacent ventricle on this nongated examination, although is grossly unchanged. This has been present for some time and appeared hypermetabolic on outside PET-CT performed 11/04/2019. Stability suggests a treated lesion. This may be better evaluated with follow-up cardiac MR or PET-CT if clinically warranted. 3. Stable part solid right upper lobe pulmonary nodule, again favored to be benign based on stability. 4. Stable postsurgical changes in the neck related to previous laryngectomy and tracheostomy placement. 5.  Aortic Atherosclerosis (ICD10-I70.0). Electronically Signed   By: Carey Bullocks M.D.   On: 05/22/2023 16:31

## 2023-05-23 NOTE — Assessment & Plan Note (Signed)
Her last Ct imaging in August  showed no signs of disease progression She has been off treatment for a year I plan to space out interval imaging study to every 6 months I plan to repeat imaging study around February 2025 We will continue port maintence flushes and close monitoring of her blood work in 2 months

## 2023-05-29 DIAGNOSIS — C321 Malignant neoplasm of supraglottis: Secondary | ICD-10-CM | POA: Diagnosis not present

## 2023-05-29 DIAGNOSIS — R633 Feeding difficulties, unspecified: Secondary | ICD-10-CM | POA: Diagnosis not present

## 2023-06-08 ENCOUNTER — Other Ambulatory Visit: Payer: Self-pay | Admitting: Hematology and Oncology

## 2023-06-08 DIAGNOSIS — C321 Malignant neoplasm of supraglottis: Secondary | ICD-10-CM

## 2023-06-09 ENCOUNTER — Encounter: Payer: Self-pay | Admitting: Pharmacist

## 2023-06-09 ENCOUNTER — Other Ambulatory Visit: Payer: Self-pay

## 2023-06-09 ENCOUNTER — Other Ambulatory Visit (HOSPITAL_COMMUNITY): Payer: Self-pay

## 2023-06-09 ENCOUNTER — Encounter: Payer: Self-pay | Admitting: Hematology and Oncology

## 2023-06-09 ENCOUNTER — Encounter (HOSPITAL_COMMUNITY): Payer: Self-pay

## 2023-06-09 MED ORDER — APIXABAN 2.5 MG PO TABS
2.5000 mg | ORAL_TABLET | Freq: Two times a day (BID) | ORAL | 3 refills | Status: DC
Start: 2023-06-09 — End: 2023-10-09
  Filled 2023-06-09: qty 60, 30d supply, fill #0
  Filled 2023-07-09: qty 60, 30d supply, fill #1
  Filled 2023-08-11: qty 60, 30d supply, fill #2
  Filled 2023-09-05: qty 60, 30d supply, fill #3

## 2023-06-09 MED ORDER — LEVOTHYROXINE SODIUM 125 MCG PO TABS
125.0000 ug | ORAL_TABLET | Freq: Every day | ORAL | 6 refills | Status: DC
  Filled 2023-06-09: qty 30, 30d supply, fill #0
  Filled 2023-07-06: qty 30, 30d supply, fill #1
  Filled 2023-08-11: qty 30, 30d supply, fill #2
  Filled 2023-09-05: qty 30, 30d supply, fill #3
  Filled 2023-10-09: qty 30, 30d supply, fill #4
  Filled 2023-11-05: qty 30, 30d supply, fill #5
  Filled 2023-12-06: qty 30, 30d supply, fill #6

## 2023-06-09 MED ORDER — HYDROCODONE-ACETAMINOPHEN 5-325 MG PO TABS
1.0000 | ORAL_TABLET | Freq: Four times a day (QID) | ORAL | 0 refills | Status: DC | PRN
  Filled 2023-06-09: qty 60, 15d supply, fill #0

## 2023-06-10 ENCOUNTER — Telehealth: Payer: Self-pay

## 2023-06-10 ENCOUNTER — Other Ambulatory Visit: Payer: Self-pay | Admitting: Hematology and Oncology

## 2023-06-10 ENCOUNTER — Other Ambulatory Visit (HOSPITAL_COMMUNITY): Payer: Self-pay

## 2023-06-10 NOTE — Telephone Encounter (Signed)
Will call the patient and investigate

## 2023-06-10 NOTE — Telephone Encounter (Signed)
Called and spoke with son earlier regarding price of Eliquis going up. Called [name] and will mail out manufacturer assistance form to listed address for Eliquis assistance. They will mail back to form to the office.

## 2023-06-14 ENCOUNTER — Other Ambulatory Visit (HOSPITAL_COMMUNITY): Payer: Self-pay

## 2023-06-27 ENCOUNTER — Telehealth: Payer: Self-pay

## 2023-06-27 NOTE — Telephone Encounter (Signed)
Fax sent to [employer] for Eliquis prescription. Fax confirmation received.

## 2023-07-04 ENCOUNTER — Other Ambulatory Visit: Payer: Self-pay

## 2023-07-04 ENCOUNTER — Telehealth: Payer: Self-pay

## 2023-07-04 MED ORDER — APIXABAN 2.5 MG PO TABS: 2.5000 mg | ORAL_TABLET | Freq: Two times a day (BID) | ORAL | 3 refills | Status: DC

## 2023-07-04 NOTE — Telephone Encounter (Signed)
Returned call to [place], she is asking if the office got the application for patient assistance for Eliquis. Told her that the application was fax on 10/3. She verbalized understanding and will tell Bonita Quin.  Refaxed application for patient assistance for Eliquis to [phone], received fax confirmation.

## 2023-07-05 ENCOUNTER — Other Ambulatory Visit: Payer: Self-pay | Admitting: Hematology and Oncology

## 2023-07-05 DIAGNOSIS — C321 Malignant neoplasm of supraglottis: Secondary | ICD-10-CM

## 2023-07-06 ENCOUNTER — Other Ambulatory Visit (HOSPITAL_COMMUNITY): Payer: Self-pay

## 2023-07-07 ENCOUNTER — Other Ambulatory Visit: Payer: Self-pay

## 2023-07-07 ENCOUNTER — Encounter: Payer: Self-pay | Admitting: Hematology and Oncology

## 2023-07-07 ENCOUNTER — Other Ambulatory Visit (HOSPITAL_COMMUNITY): Payer: Self-pay

## 2023-07-07 MED ORDER — HYDROCODONE-ACETAMINOPHEN 5-325 MG PO TABS
1.0000 | ORAL_TABLET | Freq: Four times a day (QID) | ORAL | 0 refills | Status: DC | PRN
Start: 2023-07-07 — End: 2023-07-30
  Filled 2023-07-07: qty 60, 15d supply, fill #0

## 2023-07-08 ENCOUNTER — Encounter: Payer: Self-pay | Admitting: Hematology and Oncology

## 2023-07-09 ENCOUNTER — Other Ambulatory Visit: Payer: Self-pay

## 2023-07-09 ENCOUNTER — Encounter: Payer: Self-pay | Admitting: Hematology and Oncology

## 2023-07-09 ENCOUNTER — Other Ambulatory Visit (HOSPITAL_COMMUNITY): Payer: Self-pay

## 2023-07-10 ENCOUNTER — Other Ambulatory Visit: Payer: Self-pay

## 2023-07-10 ENCOUNTER — Telehealth: Payer: Self-pay

## 2023-07-10 DIAGNOSIS — C329 Malignant neoplasm of larynx, unspecified: Secondary | ICD-10-CM

## 2023-07-10 DIAGNOSIS — C321 Malignant neoplasm of supraglottis: Secondary | ICD-10-CM

## 2023-07-10 DIAGNOSIS — E46 Unspecified protein-calorie malnutrition: Secondary | ICD-10-CM

## 2023-07-10 DIAGNOSIS — Z931 Gastrostomy status: Secondary | ICD-10-CM

## 2023-07-10 MED ORDER — KATE FARMS PEPTIDE 1.5 PO LIQD
ORAL | 11 refills | Status: AC
Start: 2023-07-10 — End: ?

## 2023-07-10 NOTE — Telephone Encounter (Signed)
Called Cindy regarding [employer]. Called [facility] and faxed Rx for [employer] Peptide 1.5 3 cans per day orally. Faxed to [phone], received fax confirmation.

## 2023-07-10 NOTE — Telephone Encounter (Signed)
Called Cindy back and told Rx faxed. She will try calling [facility] to get delivery of [employer].

## 2023-07-11 ENCOUNTER — Telehealth: Payer: Self-pay

## 2023-07-11 NOTE — Telephone Encounter (Signed)
Called Cindy back. Called Patient assistance foundation to check on Eliquis application. Updated the application to say 2 people in the household. Ezequiel Ganser Patient assistance foundation phone # to check on application next week. She verbalized understanding.  Insurance will no longer pay for [employer] Peptide 1.5  due to Arreon is taking orally and not thru a tube feeding. Monthly cost will be $750.00. They are concerned and ask that I contact the Dietician regarding possible alternatives. Sent message.

## 2023-07-11 NOTE — Telephone Encounter (Signed)
Called [place] back after speaking with [place], dietician. Insurance does not pay for oral nutrutional drinks. She recommends [employer] 1.4 and she can call [facility] back to get a price. [name] said that she already ordered 1 months supply of previous Rx. She will check on the price of [name] 1.4  when she needs to order again. Per Lesle Reek, no referral needed today and they do not typically see patients this far out from treatment.  [name] will call the office back for questions or if new Rx needed for [facility].

## 2023-07-15 ENCOUNTER — Telehealth: Payer: Self-pay

## 2023-07-15 NOTE — Telephone Encounter (Signed)
Called [place] regarding fax the office received from [facility] patient assistance foundation for [employer]. Told her that Makenzi is not eligible to receive Eliquis. Cindy verbalized understanding and will tell Uchenna.

## 2023-07-18 ENCOUNTER — Other Ambulatory Visit: Payer: Self-pay

## 2023-07-18 ENCOUNTER — Inpatient Hospital Stay: Payer: Medicare HMO | Attending: Hematology and Oncology

## 2023-07-18 DIAGNOSIS — G893 Neoplasm related pain (acute) (chronic): Secondary | ICD-10-CM | POA: Insufficient documentation

## 2023-07-18 DIAGNOSIS — Z923 Personal history of irradiation: Secondary | ICD-10-CM | POA: Diagnosis not present

## 2023-07-18 DIAGNOSIS — C7800 Secondary malignant neoplasm of unspecified lung: Secondary | ICD-10-CM | POA: Insufficient documentation

## 2023-07-18 DIAGNOSIS — C321 Malignant neoplasm of supraglottis: Secondary | ICD-10-CM | POA: Insufficient documentation

## 2023-07-18 DIAGNOSIS — Z9221 Personal history of antineoplastic chemotherapy: Secondary | ICD-10-CM | POA: Insufficient documentation

## 2023-07-18 DIAGNOSIS — Z79899 Other long term (current) drug therapy: Secondary | ICD-10-CM | POA: Diagnosis not present

## 2023-07-18 DIAGNOSIS — C329 Malignant neoplasm of larynx, unspecified: Secondary | ICD-10-CM

## 2023-07-18 DIAGNOSIS — E039 Hypothyroidism, unspecified: Secondary | ICD-10-CM

## 2023-07-18 LAB — CBC WITH DIFFERENTIAL/PLATELET
Abs Immature Granulocytes: 0 10*3/uL (ref 0.00–0.07)
Basophils Absolute: 0 10*3/uL (ref 0.0–0.1)
Basophils Relative: 0 %
Eosinophils Absolute: 0.2 10*3/uL (ref 0.0–0.5)
Eosinophils Relative: 7 %
HCT: 38.1 % (ref 36.0–46.0)
Hemoglobin: 12.7 g/dL (ref 12.0–15.0)
Immature Granulocytes: 0 %
Lymphocytes Relative: 21 %
Lymphs Abs: 0.7 10*3/uL (ref 0.7–4.0)
MCH: 29 pg (ref 26.0–34.0)
MCHC: 33.3 g/dL (ref 30.0–36.0)
MCV: 87 fL (ref 80.0–100.0)
Monocytes Absolute: 0.5 10*3/uL (ref 0.1–1.0)
Monocytes Relative: 14 %
Neutro Abs: 2 10*3/uL (ref 1.7–7.7)
Neutrophils Relative %: 58 %
Platelets: 180 10*3/uL (ref 150–400)
RBC: 4.38 MIL/uL (ref 3.87–5.11)
RDW: 13.2 % (ref 11.5–15.5)
WBC: 3.4 10*3/uL — ABNORMAL LOW (ref 4.0–10.5)
nRBC: 0 % (ref 0.0–0.2)

## 2023-07-18 LAB — COMPREHENSIVE METABOLIC PANEL
ALT: 22 U/L (ref 0–44)
AST: 27 U/L (ref 15–41)
Albumin: 4.1 g/dL (ref 3.5–5.0)
Alkaline Phosphatase: 56 U/L (ref 38–126)
Anion gap: 6 (ref 5–15)
BUN: 36 mg/dL — ABNORMAL HIGH (ref 8–23)
CO2: 27 mmol/L (ref 22–32)
Calcium: 9 mg/dL (ref 8.9–10.3)
Chloride: 105 mmol/L (ref 98–111)
Creatinine, Ser: 0.95 mg/dL (ref 0.44–1.00)
GFR, Estimated: >60 mL/min (ref >60)
Glucose, Bld: 114 mg/dL — ABNORMAL HIGH (ref 70–99)
Potassium: 4.2 mmol/L (ref 3.5–5.1)
Sodium: 138 mmol/L (ref 135–145)
Total Bilirubin: 0.3 mg/dL (ref 0.3–1.2)
Total Protein: 7.1 g/dL (ref 6.5–8.1)

## 2023-07-18 LAB — TSH: TSH: 2.122 u[IU]/mL (ref 0.350–4.500)

## 2023-07-30 ENCOUNTER — Other Ambulatory Visit: Payer: Self-pay

## 2023-07-30 ENCOUNTER — Other Ambulatory Visit: Payer: Self-pay | Admitting: Hematology and Oncology

## 2023-07-30 DIAGNOSIS — C321 Malignant neoplasm of supraglottis: Secondary | ICD-10-CM

## 2023-07-30 MED ORDER — HYDROCODONE-ACETAMINOPHEN 5-325 MG PO TABS
1.0000 | ORAL_TABLET | Freq: Four times a day (QID) | ORAL | 0 refills | Status: DC | PRN
Start: 2023-07-30 — End: 2023-08-24
  Filled 2023-07-30: qty 60, 15d supply, fill #0

## 2023-08-11 ENCOUNTER — Other Ambulatory Visit (HOSPITAL_COMMUNITY): Payer: Self-pay

## 2023-08-13 ENCOUNTER — Encounter: Payer: Self-pay | Admitting: Hematology and Oncology

## 2023-08-13 NOTE — Telephone Encounter (Signed)
Telephone call  

## 2023-08-24 ENCOUNTER — Other Ambulatory Visit: Payer: Self-pay | Admitting: Hematology and Oncology

## 2023-08-24 DIAGNOSIS — C321 Malignant neoplasm of supraglottis: Secondary | ICD-10-CM

## 2023-08-25 ENCOUNTER — Other Ambulatory Visit (HOSPITAL_COMMUNITY): Payer: Self-pay

## 2023-08-25 ENCOUNTER — Encounter (HOSPITAL_COMMUNITY): Payer: Self-pay

## 2023-08-25 MED ORDER — HYDROCODONE-ACETAMINOPHEN 5-325 MG PO TABS
1.0000 | ORAL_TABLET | Freq: Four times a day (QID) | ORAL | 0 refills | Status: DC | PRN
Start: 2023-08-25 — End: 2023-09-18
  Filled 2023-08-25: qty 60, 15d supply, fill #0

## 2023-09-05 ENCOUNTER — Other Ambulatory Visit: Payer: Self-pay

## 2023-09-12 ENCOUNTER — Inpatient Hospital Stay: Payer: Medicare HMO | Attending: Hematology and Oncology

## 2023-09-12 DIAGNOSIS — G893 Neoplasm related pain (acute) (chronic): Secondary | ICD-10-CM | POA: Diagnosis not present

## 2023-09-12 DIAGNOSIS — C321 Malignant neoplasm of supraglottis: Secondary | ICD-10-CM | POA: Insufficient documentation

## 2023-09-12 DIAGNOSIS — Z79899 Other long term (current) drug therapy: Secondary | ICD-10-CM | POA: Insufficient documentation

## 2023-09-12 DIAGNOSIS — Z452 Encounter for adjustment and management of vascular access device: Secondary | ICD-10-CM | POA: Insufficient documentation

## 2023-09-12 DIAGNOSIS — Z9221 Personal history of antineoplastic chemotherapy: Secondary | ICD-10-CM | POA: Insufficient documentation

## 2023-09-12 DIAGNOSIS — C7800 Secondary malignant neoplasm of unspecified lung: Secondary | ICD-10-CM | POA: Insufficient documentation

## 2023-09-12 DIAGNOSIS — Z923 Personal history of irradiation: Secondary | ICD-10-CM | POA: Diagnosis not present

## 2023-09-12 DIAGNOSIS — Z95828 Presence of other vascular implants and grafts: Secondary | ICD-10-CM

## 2023-09-12 MED ORDER — HEPARIN SOD (PORK) LOCK FLUSH 100 UNIT/ML IV SOLN
500.0000 [IU] | Freq: Once | INTRAVENOUS | Status: AC
  Administered 2023-09-12: 500 [IU]

## 2023-09-12 MED ORDER — SODIUM CHLORIDE 0.9% FLUSH
10.0000 mL | Freq: Once | INTRAVENOUS | Status: AC
  Administered 2023-09-12: 10 mL

## 2023-09-18 ENCOUNTER — Other Ambulatory Visit: Payer: Self-pay | Admitting: Hematology and Oncology

## 2023-09-18 DIAGNOSIS — C321 Malignant neoplasm of supraglottis: Secondary | ICD-10-CM

## 2023-09-19 ENCOUNTER — Other Ambulatory Visit (HOSPITAL_COMMUNITY): Payer: Self-pay

## 2023-09-19 MED ORDER — HYDROCODONE-ACETAMINOPHEN 5-325 MG PO TABS
1.0000 | ORAL_TABLET | Freq: Four times a day (QID) | ORAL | 0 refills | Status: DC | PRN
Start: 2023-09-19 — End: 2023-10-09
  Filled 2023-09-19: qty 60, 15d supply, fill #0

## 2023-09-29 ENCOUNTER — Telehealth: Payer: Self-pay

## 2023-09-29 NOTE — Telephone Encounter (Signed)
 Called [name] [name] and she will help Flor schedule CT scan on 2/21. They will call the office back for questions/concerns.

## 2023-09-29 NOTE — Telephone Encounter (Signed)
-----   Message from Artis Delay sent at 09/29/2023  7:42 AM EST ----- Pls get her/son to call and schedule CT for 2/21 and start tracking

## 2023-10-01 DIAGNOSIS — C329 Malignant neoplasm of larynx, unspecified: Secondary | ICD-10-CM | POA: Diagnosis not present

## 2023-10-07 DIAGNOSIS — Z1211 Encounter for screening for malignant neoplasm of colon: Secondary | ICD-10-CM | POA: Diagnosis not present

## 2023-10-07 DIAGNOSIS — Z930 Tracheostomy status: Secondary | ICD-10-CM | POA: Diagnosis not present

## 2023-10-07 DIAGNOSIS — C7989 Secondary malignant neoplasm of other specified sites: Secondary | ICD-10-CM | POA: Diagnosis not present

## 2023-10-07 DIAGNOSIS — E2839 Other primary ovarian failure: Secondary | ICD-10-CM | POA: Diagnosis not present

## 2023-10-07 DIAGNOSIS — E892 Postprocedural hypoparathyroidism: Secondary | ICD-10-CM | POA: Diagnosis not present

## 2023-10-07 DIAGNOSIS — C329 Malignant neoplasm of larynx, unspecified: Secondary | ICD-10-CM | POA: Diagnosis not present

## 2023-10-07 DIAGNOSIS — Z1231 Encounter for screening mammogram for malignant neoplasm of breast: Secondary | ICD-10-CM | POA: Diagnosis not present

## 2023-10-07 DIAGNOSIS — E039 Hypothyroidism, unspecified: Secondary | ICD-10-CM | POA: Diagnosis not present

## 2023-10-07 DIAGNOSIS — I700 Atherosclerosis of aorta: Secondary | ICD-10-CM | POA: Diagnosis not present

## 2023-10-07 DIAGNOSIS — Z23 Encounter for immunization: Secondary | ICD-10-CM | POA: Diagnosis not present

## 2023-10-07 DIAGNOSIS — E782 Mixed hyperlipidemia: Secondary | ICD-10-CM | POA: Diagnosis not present

## 2023-10-09 ENCOUNTER — Other Ambulatory Visit: Payer: Self-pay | Admitting: Hematology and Oncology

## 2023-10-09 ENCOUNTER — Other Ambulatory Visit: Payer: Self-pay

## 2023-10-09 ENCOUNTER — Encounter (HOSPITAL_COMMUNITY): Payer: Self-pay

## 2023-10-09 ENCOUNTER — Other Ambulatory Visit (HOSPITAL_COMMUNITY): Payer: Self-pay

## 2023-10-09 DIAGNOSIS — C321 Malignant neoplasm of supraglottis: Secondary | ICD-10-CM

## 2023-10-09 MED ORDER — APIXABAN 2.5 MG PO TABS
2.5000 mg | ORAL_TABLET | Freq: Two times a day (BID) | ORAL | 3 refills | Status: DC
  Filled 2023-10-09: qty 60, 30d supply, fill #0
  Filled 2023-11-05: qty 60, 30d supply, fill #1
  Filled 2023-12-06: qty 60, 30d supply, fill #2
  Filled 2024-01-07: qty 60, 30d supply, fill #3

## 2023-10-09 MED ORDER — HYDROCODONE-ACETAMINOPHEN 5-325 MG PO TABS
1.0000 | ORAL_TABLET | Freq: Four times a day (QID) | ORAL | 0 refills | Status: DC | PRN
Start: 2023-10-09 — End: 2023-11-05
  Filled 2023-10-09: qty 60, 15d supply, fill #0

## 2023-10-23 DIAGNOSIS — Z430 Encounter for attention to tracheostomy: Secondary | ICD-10-CM | POA: Diagnosis not present

## 2023-11-05 ENCOUNTER — Other Ambulatory Visit (HOSPITAL_COMMUNITY): Payer: Self-pay

## 2023-11-05 ENCOUNTER — Other Ambulatory Visit: Payer: Self-pay | Admitting: Hematology and Oncology

## 2023-11-05 ENCOUNTER — Other Ambulatory Visit: Payer: Self-pay

## 2023-11-05 DIAGNOSIS — C321 Malignant neoplasm of supraglottis: Secondary | ICD-10-CM

## 2023-11-05 MED ORDER — HYDROCODONE-ACETAMINOPHEN 5-325 MG PO TABS
1.0000 | ORAL_TABLET | Freq: Four times a day (QID) | ORAL | 0 refills | Status: DC | PRN
Start: 2023-11-05 — End: 2023-11-25
  Filled 2023-11-05: qty 60, 15d supply, fill #0

## 2023-11-14 ENCOUNTER — Ambulatory Visit (HOSPITAL_COMMUNITY): Payer: Medicare HMO

## 2023-11-14 ENCOUNTER — Inpatient Hospital Stay: Payer: Medicare HMO

## 2023-11-18 ENCOUNTER — Inpatient Hospital Stay: Payer: Medicare HMO | Attending: Hematology and Oncology

## 2023-11-18 ENCOUNTER — Ambulatory Visit (HOSPITAL_COMMUNITY)
Admission: RE | Admit: 2023-11-18 | Discharge: 2023-11-18 | Disposition: A | Discharge status: Home / Self Care | Payer: Medicare HMO | Source: Ambulatory Visit | Attending: Hematology and Oncology | Admitting: Hematology and Oncology

## 2023-11-18 DIAGNOSIS — E039 Hypothyroidism, unspecified: Secondary | ICD-10-CM

## 2023-11-18 DIAGNOSIS — C329 Malignant neoplasm of larynx, unspecified: Secondary | ICD-10-CM | POA: Insufficient documentation

## 2023-11-18 DIAGNOSIS — C7989 Secondary malignant neoplasm of other specified sites: Secondary | ICD-10-CM | POA: Diagnosis not present

## 2023-11-18 DIAGNOSIS — R918 Other nonspecific abnormal finding of lung field: Secondary | ICD-10-CM | POA: Diagnosis not present

## 2023-11-18 DIAGNOSIS — C321 Malignant neoplasm of supraglottis: Secondary | ICD-10-CM | POA: Diagnosis not present

## 2023-11-18 DIAGNOSIS — C770 Secondary and unspecified malignant neoplasm of lymph nodes of head, face and neck: Secondary | ICD-10-CM | POA: Insufficient documentation

## 2023-11-18 DIAGNOSIS — Z95828 Presence of other vascular implants and grafts: Secondary | ICD-10-CM

## 2023-11-18 DIAGNOSIS — E038 Other specified hypothyroidism: Secondary | ICD-10-CM | POA: Insufficient documentation

## 2023-11-18 DIAGNOSIS — C760 Malignant neoplasm of head, face and neck: Secondary | ICD-10-CM | POA: Diagnosis not present

## 2023-11-18 LAB — COMPREHENSIVE METABOLIC PANEL
ALT: 23 U/L (ref 0–44)
AST: 26 U/L (ref 15–41)
Albumin: 4.3 g/dL (ref 3.5–5.0)
Alkaline Phosphatase: 59 U/L (ref 38–126)
Anion gap: 7 (ref 5–15)
BUN: 27 mg/dL — ABNORMAL HIGH (ref 8–23)
CO2: 29 mmol/L (ref 22–32)
Calcium: 9.1 mg/dL (ref 8.9–10.3)
Chloride: 103 mmol/L (ref 98–111)
Creatinine, Ser: 0.96 mg/dL (ref 0.44–1.00)
GFR, Estimated: >60 mL/min (ref >60)
Glucose, Bld: 96 mg/dL (ref 70–99)
Potassium: 4 mmol/L (ref 3.5–5.1)
Sodium: 139 mmol/L (ref 135–145)
Total Bilirubin: 0.3 mg/dL (ref 0.0–1.2)
Total Protein: 7.3 g/dL (ref 6.5–8.1)

## 2023-11-18 LAB — CBC WITH DIFFERENTIAL/PLATELET
Abs Immature Granulocytes: 0.01 10*3/uL (ref 0.00–0.07)
Basophils Absolute: 0 10*3/uL (ref 0.0–0.1)
Basophils Relative: 0 %
Eosinophils Absolute: 0.2 10*3/uL (ref 0.0–0.5)
Eosinophils Relative: 4 %
HCT: 40.8 % (ref 36.0–46.0)
Hemoglobin: 13.7 g/dL (ref 12.0–15.0)
Immature Granulocytes: 0 %
Lymphocytes Relative: 30 %
Lymphs Abs: 1.3 10*3/uL (ref 0.7–4.0)
MCH: 28.7 pg (ref 26.0–34.0)
MCHC: 33.6 g/dL (ref 30.0–36.0)
MCV: 85.5 fL (ref 80.0–100.0)
Monocytes Absolute: 0.5 10*3/uL (ref 0.1–1.0)
Monocytes Relative: 11 %
Neutro Abs: 2.2 10*3/uL (ref 1.7–7.7)
Neutrophils Relative %: 55 %
Platelets: 207 10*3/uL (ref 150–400)
RBC: 4.77 MIL/uL (ref 3.87–5.11)
RDW: 13.6 % (ref 11.5–15.5)
WBC: 4.1 10*3/uL (ref 4.0–10.5)
nRBC: 0 % (ref 0.0–0.2)

## 2023-11-18 MED ORDER — SODIUM CHLORIDE 0.9% FLUSH
10.0000 mL | Freq: Once | INTRAVENOUS | Status: AC
  Administered 2023-11-18: 10 mL

## 2023-11-18 MED ORDER — HEPARIN SOD (PORK) LOCK FLUSH 100 UNIT/ML IV SOLN
INTRAVENOUS | Status: AC
  Filled 2023-11-18: qty 5

## 2023-11-18 MED ORDER — HEPARIN SOD (PORK) LOCK FLUSH 100 UNIT/ML IV SOLN: 500.0000 [IU] | Freq: Once | INTRAVENOUS | Status: DC

## 2023-11-18 MED ORDER — IOHEXOL 300 MG/ML  SOLN
75.0000 mL | Freq: Once | INTRAMUSCULAR | Status: AC | PRN
  Administered 2023-11-18: 75 mL via INTRAVENOUS

## 2023-11-19 LAB — TSH: TSH: 3.257 u[IU]/mL (ref 0.350–4.500)

## 2023-11-21 ENCOUNTER — Inpatient Hospital Stay: Payer: Medicare HMO | Admitting: Hematology and Oncology

## 2023-11-24 DIAGNOSIS — Z1211 Encounter for screening for malignant neoplasm of colon: Secondary | ICD-10-CM | POA: Diagnosis not present

## 2023-11-24 DIAGNOSIS — Z1212 Encounter for screening for malignant neoplasm of rectum: Secondary | ICD-10-CM | POA: Diagnosis not present

## 2023-11-25 ENCOUNTER — Other Ambulatory Visit (HOSPITAL_COMMUNITY): Payer: Self-pay

## 2023-11-25 ENCOUNTER — Inpatient Hospital Stay: Payer: Medicare HMO | Attending: Hematology and Oncology | Admitting: Hematology and Oncology

## 2023-11-25 ENCOUNTER — Encounter: Payer: Self-pay | Admitting: Hematology and Oncology

## 2023-11-25 VITALS — BP 117/70 | HR 94 | Temp 97.5°F | Resp 18 | Ht 62.0 in | Wt 144.6 lb

## 2023-11-25 DIAGNOSIS — Z923 Personal history of irradiation: Secondary | ICD-10-CM | POA: Diagnosis not present

## 2023-11-25 DIAGNOSIS — C329 Malignant neoplasm of larynx, unspecified: Secondary | ICD-10-CM

## 2023-11-25 DIAGNOSIS — Z9221 Personal history of antineoplastic chemotherapy: Secondary | ICD-10-CM | POA: Diagnosis not present

## 2023-11-25 DIAGNOSIS — R1310 Dysphagia, unspecified: Secondary | ICD-10-CM

## 2023-11-25 DIAGNOSIS — C321 Malignant neoplasm of supraglottis: Secondary | ICD-10-CM | POA: Diagnosis not present

## 2023-11-25 DIAGNOSIS — C770 Secondary and unspecified malignant neoplasm of lymph nodes of head, face and neck: Secondary | ICD-10-CM | POA: Insufficient documentation

## 2023-11-25 DIAGNOSIS — E032 Hypothyroidism due to medicaments and other exogenous substances: Secondary | ICD-10-CM | POA: Diagnosis not present

## 2023-11-25 DIAGNOSIS — T451X5A Adverse effect of antineoplastic and immunosuppressive drugs, initial encounter: Secondary | ICD-10-CM | POA: Diagnosis not present

## 2023-11-25 DIAGNOSIS — E039 Hypothyroidism, unspecified: Secondary | ICD-10-CM

## 2023-11-25 MED ORDER — HYDROCODONE-ACETAMINOPHEN 5-325 MG PO TABS
1.0000 | ORAL_TABLET | Freq: Four times a day (QID) | ORAL | 0 refills | Status: DC | PRN
Start: 2023-11-25 — End: 2023-12-17
  Filled 2023-11-25 (×2): qty 60, 15d supply, fill #0

## 2023-11-25 NOTE — Progress Notes (Signed)
 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 laryngeal cancer status post chemotherapy and radiation therapy with relapse in 2021, with metastatic disease to left submandibular lymph node as well as right ventricle She responded well to pembrolizumab treatment elsewhere except for the right ventricle and subsequently received cisplatin with stable disease control She has been off treatment since end of April 2023  I have reviewed imaging study which showed stability in the right ventricle mass, awaiting final report by radiologist to confirm Once results confirm stable disease control, I recommend port removal Due to high risk of cancer relapse, I recommend close surveillance imaging every 6 months Acquired hypothyroidism Due to exposure to pembrolizumab and prior radiation, she will continue thyroid replacement therapy and we will monitor and adjust the dose as needed Dysphagia, unspecified type She has chronic dysphagia due to laryngectomy and radiation She is dependent on liquid supplement for nutrition So far, she will continue the same as she is able to maintain weight  Orders Placed This Encounter  Procedures   CT CHEST W CONTRAST    Standing Status:   Future    Expected Date:   05/28/2024    Expiration Date:   11/24/2024    If indicated for the ordered procedure, I authorize the administration of contrast media per Radiology protocol:   Yes    Preferred imaging location?:   [facility]    Radiology Contrast Protocol - do NOT remove file path:   [email or web address]     Artis Delay, MD  INTERVAL HISTORY: she returns for surveillance follow-up We discussed test results and future plan of care as outlined above She  denies worsening pain.  Continues to have persistent dysphagia.  She has switched to store brand type liquid supplement due to inability to afford payment for prescription nutritional supplement.  She has mild intermittent bleeding from the tracheostomy site but denies recent infection Her weight has been stable We reviewed multiple imaging studies and blood work  PHYSICAL EXAMINATION: ECOG PERFORMANCE STATUS: 0 - Asymptomatic  Vitals:   11/25/23 0916  BP: 117/70  Pulse: 94  Resp: 18  Temp: (!) 97.5 F (36.4 C)  SpO2: 99%   Filed Weights   11/25/23 0916  Weight: 144 lb 9.6 oz (65.6 kg)

## 2023-11-25 NOTE — Assessment & Plan Note (Addendum)
 Due to exposure to pembrolizumab and prior radiation, she will continue thyroid replacement therapy and we will monitor and adjust the dose as needed

## 2023-11-25 NOTE — Assessment & Plan Note (Addendum)
 She has chronic dysphagia due to laryngectomy and radiation She is dependent on liquid supplement for nutrition So far, she will continue the same as she is able to maintain weight

## 2023-11-25 NOTE — Assessment & Plan Note (Addendum)
 She had recurrent laryngeal cancer status post chemotherapy and radiation therapy with relapse in 2021, with metastatic disease to left submandibular lymph node as well as right ventricle She responded well to pembrolizumab treatment elsewhere except for the right ventricle and subsequently received cisplatin with stable disease control She has been off treatment since end of April 2023  I have reviewed imaging study which showed stability in the right ventricle mass, awaiting final report by radiologist to confirm Once results confirm stable disease control, I recommend port removal Due to high risk of cancer relapse, I recommend close surveillance imaging every 6 months

## 2023-11-26 ENCOUNTER — Telehealth: Payer: Self-pay

## 2023-11-26 ENCOUNTER — Other Ambulatory Visit: Payer: Self-pay | Admitting: Hematology and Oncology

## 2023-11-26 DIAGNOSIS — C329 Malignant neoplasm of larynx, unspecified: Secondary | ICD-10-CM

## 2023-11-26 NOTE — Telephone Encounter (Signed)
-----   Message from Artis Delay sent at 11/26/2023  2:02 PM EST ----- Pls call her son and let him know Ct result confirmed no changes Plan to get her port removed I sent order for port removal

## 2023-11-26 NOTE — Telephone Encounter (Signed)
 Attempted to call pt's son, Joe per MD. Phone rang continuously with no answer and no voicemail available. Attempted to call pt's phone and LVM for call back with message from MD.

## 2023-11-27 ENCOUNTER — Encounter: Payer: Self-pay | Admitting: Hematology and Oncology

## 2023-12-01 ENCOUNTER — Telehealth: Payer: Self-pay | Admitting: *Deleted

## 2023-12-01 NOTE — Telephone Encounter (Signed)
 [name] [name] called.Ms. [name] [name] has port removal scheduled on Friday March 14.  Ms. [name] [name] had Cologard test via [facility]. Results received last Friday were that Ms. [name] [name] had either pre cancerous polyps or colon cancer. They are waiting on call from [facility] to schedule follow up. Ms. [name] [name] has decided she wants to wait to have port removed until she finds out more from GI.  Advised her that this Friday's appt can be cancelled and rescheduled at a later date. She asked to cancel the appt for port removal. Message sent to IR to cancel appt on Friday.

## 2023-12-04 ENCOUNTER — Encounter: Payer: Self-pay | Admitting: Gastroenterology

## 2023-12-05 ENCOUNTER — Other Ambulatory Visit (HOSPITAL_COMMUNITY)

## 2023-12-06 ENCOUNTER — Other Ambulatory Visit (HOSPITAL_COMMUNITY): Payer: Self-pay

## 2023-12-08 ENCOUNTER — Other Ambulatory Visit: Payer: Self-pay

## 2023-12-09 ENCOUNTER — Other Ambulatory Visit: Payer: Self-pay

## 2023-12-17 ENCOUNTER — Other Ambulatory Visit: Payer: Self-pay | Admitting: Hematology and Oncology

## 2023-12-17 DIAGNOSIS — C321 Malignant neoplasm of supraglottis: Secondary | ICD-10-CM

## 2023-12-18 ENCOUNTER — Other Ambulatory Visit: Payer: Self-pay

## 2023-12-18 ENCOUNTER — Other Ambulatory Visit (HOSPITAL_COMMUNITY): Payer: Self-pay

## 2023-12-18 ENCOUNTER — Encounter (HOSPITAL_COMMUNITY): Payer: Self-pay

## 2023-12-18 MED ORDER — HYDROCODONE-ACETAMINOPHEN 5-325 MG PO TABS
1.0000 | ORAL_TABLET | Freq: Four times a day (QID) | ORAL | 0 refills | Status: DC | PRN
Start: 2023-12-18 — End: 2024-01-07
  Filled 2023-12-18: qty 60, 15d supply, fill #0

## 2024-01-07 ENCOUNTER — Other Ambulatory Visit (HOSPITAL_COMMUNITY): Payer: Self-pay

## 2024-01-07 ENCOUNTER — Other Ambulatory Visit: Payer: Self-pay

## 2024-01-07 ENCOUNTER — Encounter (HOSPITAL_COMMUNITY): Payer: Self-pay

## 2024-01-07 ENCOUNTER — Other Ambulatory Visit: Payer: Self-pay | Admitting: Hematology and Oncology

## 2024-01-07 DIAGNOSIS — C321 Malignant neoplasm of supraglottis: Secondary | ICD-10-CM

## 2024-01-07 MED ORDER — HYDROCODONE-ACETAMINOPHEN 5-325 MG PO TABS
1.0000 | ORAL_TABLET | Freq: Four times a day (QID) | ORAL | 0 refills | Status: DC | PRN
Start: 2024-01-07 — End: 2024-02-02
  Filled 2024-01-07: qty 60, 15d supply, fill #0

## 2024-01-07 MED ORDER — LEVOTHYROXINE SODIUM 125 MCG PO TABS
125.0000 ug | ORAL_TABLET | Freq: Every day | ORAL | 6 refills | Status: DC
  Filled 2024-01-07: qty 30, 30d supply, fill #0
  Filled 2024-02-02: qty 30, 30d supply, fill #1
  Filled 2024-03-03: qty 30, 30d supply, fill #2
  Filled 2024-04-01: qty 30, 30d supply, fill #3
  Filled 2024-04-28: qty 30, 30d supply, fill #4
  Filled 2024-05-25: qty 30, 30d supply, fill #5

## 2024-01-07 NOTE — Telephone Encounter (Signed)
 Refills sent to pharmacy per patient's request. I have reviewed the PDMP during this encounter.

## 2024-01-08 ENCOUNTER — Other Ambulatory Visit (HOSPITAL_COMMUNITY): Payer: Self-pay

## 2024-01-19 DIAGNOSIS — Z430 Encounter for attention to tracheostomy: Secondary | ICD-10-CM | POA: Diagnosis not present

## 2024-01-22 ENCOUNTER — Other Ambulatory Visit (HOSPITAL_COMMUNITY): Payer: Self-pay

## 2024-01-22 ENCOUNTER — Telehealth: Payer: Self-pay

## 2024-01-22 ENCOUNTER — Ambulatory Visit: Admitting: Gastroenterology

## 2024-01-22 ENCOUNTER — Other Ambulatory Visit: Payer: Self-pay

## 2024-01-22 ENCOUNTER — Encounter: Payer: Self-pay | Admitting: Gastroenterology

## 2024-01-22 VITALS — BP 96/60 | HR 77 | Ht 62.0 in | Wt 139.2 lb

## 2024-01-22 DIAGNOSIS — Z800 Family history of malignant neoplasm of digestive organs: Secondary | ICD-10-CM

## 2024-01-22 DIAGNOSIS — Z7901 Long term (current) use of anticoagulants: Secondary | ICD-10-CM

## 2024-01-22 DIAGNOSIS — R1319 Other dysphagia: Secondary | ICD-10-CM | POA: Diagnosis not present

## 2024-01-22 DIAGNOSIS — C329 Malignant neoplasm of larynx, unspecified: Secondary | ICD-10-CM

## 2024-01-22 DIAGNOSIS — R195 Other fecal abnormalities: Secondary | ICD-10-CM

## 2024-01-22 MED ORDER — NA SULFATE-K SULFATE-MG SULF 17.5-3.13-1.6 GM/177ML PO SOLN
1.0000 | Freq: Once | ORAL | 0 refills | Status: AC
  Filled 2024-01-22: qty 354, 1d supply, fill #0

## 2024-01-22 NOTE — Patient Instructions (Signed)
 You have been scheduled for a colonoscopy. Please follow written instructions given to you at your visit today.   If you use inhalers (even only as needed), please bring them with you on the day of your procedure.  DO NOT TAKE 7 DAYS PRIOR TO TEST- Trulicity (dulaglutide) Ozempic, Wegovy (semaglutide) Mounjaro (tirzepatide) Bydureon Bcise (exanatide extended release)  DO NOT TAKE 1 DAY PRIOR TO YOUR TEST Rybelsus (semaglutide) Adlyxin (lixisenatide) Victoza (liraglutide) Byetta (exanatide) ___________________________________________________________________________  Due to recent changes in healthcare laws, you may see the results of your imaging and laboratory studies on MyChart before your provider has had a chance to review them.  We understand that in some cases there may be results that are confusing or concerning to you. Not all laboratory results come back in the same time frame and the provider may be waiting for multiple results in order to interpret others.  Please give us  48 hours in order for your provider to thoroughly review all the results before contacting the office for clarification of your results.  _______________________________________________________  If your blood pressure at your visit was 140/90 or greater, please contact your primary care physician to follow up on this.  _______________________________________________________  If you are age 69 or older, your body mass index should be between 23-30. Your Body mass index is 25.47 kg/m. If this is out of the aforementioned range listed, please consider follow up with your Primary Care Provider.  If you are age 89 or younger, your body mass index should be between 19-25. Your Body mass index is 25.47 kg/m. If this is out of the aformentioned range listed, please consider follow up with your Primary Care Provider.   ________________________________________________________  The [facility] providers would like to  encourage you to use MYCHART to communicate with providers for non-urgent requests or questions.  Due to long hold times on the telephone, sending your provider a message by [facility] may be a faster and more efficient way to get a response.  Please allow 48 business hours for a response.  Please remember that this is for non-urgent requests.  _______________________________________________________ Thank you for trusting me with your gastrointestinal care!   Dyanna Glasgow, RNP

## 2024-01-22 NOTE — Telephone Encounter (Signed)
  [name] [name] June 20, 1951 [id number]  01/22/24    Dear Dr.Gorsuch:  We have scheduled the above named patient for a(n) endoscopic procedure. Our records show that (s)he is on anticoagulation therapy.  Please advise as to whether the patient may come off their therapy of Eliquis  2 days prior to their procedure which is scheduled for 03/16/24.  Please route your response to [employer] or fax response to [phone].  Sincerely,     Gastroenterology

## 2024-01-22 NOTE — Progress Notes (Addendum)
 Chief Complaint: Positive Cologuard Primary GI Doctor: Dr. Venice Gillis   HPI:  Patient is a 73 year old female patient with past medical history of laryngeal squamous cell carcinoma, chronic dysphagia (s/p laryngectomy and radiation), CAD, and thyroid  disease, who was referred to me by Annette Barters, MD on 12/03/2023 for positive Cologuard.  --History of laryngeal squamous cell carcinoma, has been off treatment since end of April 2023.  --Acquired hypothyroidism due to exposure to pembrolizumab  and prior radiation,   Interval History    Patient presents for evaluation of positive Cologuard.  She tells me her last colonoscopy was over 10 years ago at [place] and per patient was normal.  She does tell me her mother had breast and colon cancer that required resection in her 2s. Patient denies altered bowel habits, abdominal pain, or rectal bleeding.     Patient also has tracheostomy tube present that is capped today and has difficulty speaking , accompanied by a family member who helps. She has chronic dysphagia due to laryngectomy and radiation. She is dependent on liquid supplement for nutrition. Patient denies nausea, vomiting, or weight loss. Appetite is fair.  She reports that she has quit smoking. Her smoking use included cigarettes. She has a 20.00 pack-year smoking history. She has never used smokeless tobacco.   No alcohol use.  Tracheostomy tube present. She does not require oxygen  therapy.  Patient on Eliquis  2.5 mg po daily by Dr. Daylene Evangelist Readings from Last 3 Encounters:  01/22/24 139 lb 4 oz (63.2 kg)  11/25/23 144 lb 9.6 oz (65.6 kg)  05/23/23 146 lb 3.2 oz (66.3 kg)    Past Medical History:  Diagnosis Date   Cataract    History of radiation therapy 02/11/19- 04/06/19   bilateral neck nodes and upper mediastinum   Laryngeal cancer (HCC) 11/2018   Thyroid  disease     Past Surgical History:  Procedure Laterality Date   CATARACT EXTRACTION, BILATERAL  left  05/20/16, right 06/03/16   Cataract Extraction w/ IOL Implant; Surgeon: Ollie Bhat, MD; Location: OR [facility]; Service: Ophthalmology    CESAREAN SECTION     ESOPHAGOGASTRODUODENOSCOPY (EGD) WITH PROPOFOL  N/A 02/01/2019   Procedure: ESOPHAGOGASTRODUODENOSCOPY (EGD) WITH PROPOFOL ;  Surgeon: Janel Medford, MD;  Location: [facility];  Service: Endoscopy;  Laterality: N/A;   EUS N/A 02/01/2019   Procedure: UPPER ENDOSCOPIC ULTRASOUND (EUS) RADIAL-Will need Linear and Forward Viewing EUS Scope;  Surgeon: Janel Medford, MD;  Location: [facility];  Service: Endoscopy;  Laterality: N/A;   FINE NEEDLE ASPIRATION N/A 02/01/2019   Procedure: FINE NEEDLE ASPIRATION (FNA) LINEAR;  Surgeon: Janel Medford, MD;  Location: [facility];  Service: Endoscopy;  Laterality: N/A;   IR GASTROSTOMY TUBE MOD SED  02/09/2019   IR IMAGING GUIDED PORT INSERTION  02/02/2019   IR PATIENT EVAL TECH 0-60 MINS  01/06/2020   IR RADIOLOGIST EVAL & MGMT  04/10/2020   IR REPLC GASTRO/COLONIC TUBE PERCUT W/FLUORO  12/15/2020   KNEE CARTILAGE SURGERY Right 2019   MULTIPLE EXTRACTIONS WITH ALVEOLOPLASTY N/A 10/26/2020   Procedure: MULTIPLE EXTRACTION WITH ALVEOLOPLASTY;  Surgeon: Rene Carrier, DMD;  Location: [facility] OR;  Service: Dentistry;  Laterality: N/A;   TRACHEOSTOMY  12/02/2018   [facility]    Current Outpatient Medications  Medication Sig Dispense Refill   apixaban  (ELIQUIS ) 2.5 MG TABS tablet Take 1 tablet (2.5 mg total) by mouth 2 (two) times daily. 60 tablet 3   Cholecalciferol (VITAMIN D3) 125 MCG (5000 UT)  CAPS Take 5,000 Units by mouth daily.     HYDROcodone -acetaminophen  (NORCO/VICODIN) 5-325 MG tablet Take 1 tablet by mouth every 6 (six) hours as needed for moderate pain (pain score 4-6). 60 tablet 0   levothyroxine  (SYNTHROID ) 125 MCG tablet Take 1 tablet (125 mcg total) by mouth daily before breakfast. 30 tablet 6   Na Sulfate-K Sulfate-Mg Sulfate concentrate (SUPREP) 17.5-3.13-1.6 GM/177ML SOLN Take  1 kit (354 mLs total) by mouth once for 1 dose. 354 mL 0   Nutritional Supplements (K[employer]MS PEPTIDE 1.5) LIQD 3 bottles daily 325 mL 11   rosuvastatin (CRESTOR) 10 MG tablet Take 10 mg by mouth daily.     No current facility-administered medications for this visit.    Allergies as of 01/22/2024   (No Known Allergies)    Family History  Problem Relation Age of Onset   Colon cancer Mother    Breast cancer Mother    Cancer Mother    Heart disease Father    Heart attack Father    Esophageal cancer Brother    Pancreatic cancer Neg Hx    Stomach cancer Neg Hx    Diabetes Neg Hx     Review of Systems:    Constitutional: No weight loss, fever, chills, weakness or fatigue HEENT: Eyes: No change in vision               Ears, Nose, Throat:  No change in hearing or congestion Skin: No rash or itching Cardiovascular: No chest pain, chest pressure or palpitations   Respiratory: No SOB or cough Gastrointestinal: See HPI and otherwise negative Genitourinary: No dysuria or change in urinary frequency Neurological: No headache, dizziness or syncope Musculoskeletal: No new muscle or joint pain Hematologic: No bleeding or bruising Psychiatric: No history of depression or anxiety    Physical Exam:  Vital signs: BP 96/60   Pulse 77   Wt 139 lb 4 oz (63.2 kg)   SpO2 97%   BMI 25.47 kg/m   Constitutional:   Pleasant  female appears to be in NAD, Well developed, Well nourished, alert and cooperative Neck:  Tracheostomy tube present  Throat: Oral cavity and pharynx without inflammation, swelling or lesion.  Respiratory: Respirations even and unlabored. Lungs clear to auscultation bilaterally.   No wheezes, crackles, or rhonchi.  Cardiovascular: Normal S1, S2. Regular rate and rhythm. No peripheral edema, cyanosis or pallor.  Gastrointestinal:  Soft, nondistended, nontender. No rebound or guarding. Normal bowel sounds. No appreciable masses or hepatomegaly. Rectal:  Not performed.   Msk:  Symmetrical without gross deformities. Without edema, no deformity or joint abnormality.  Neurologic:  Alert and  oriented x4;  grossly normal neurologically.  Skin:   Dry and intact without significant lesions or rashes. Psychiatric: Oriented to person, place and time. Demonstrates good judgement and reason without abnormal affect or behaviors.  RELEVANT LABS AND IMAGING: CBC    Latest Ref Rng & Units 11/18/2023    3:23 PM 07/18/2023    8:04 AM 05/16/2023    8:02 AM  CBC  WBC 4.0 - 10.5 K/uL 4.1  3.4  3.8   Hemoglobin 12.0 - 15.0 g/dL 40.9  81.1  91.4   Hematocrit 36.0 - 46.0 % 40.8  38.1  40.1   Platelets 150 - 400 K/uL 207  180  181      CMP     Latest Ref Rng & Units 11/18/2023    3:23 PM 07/18/2023    8:04 AM 05/16/2023    8:02  AM  CMP  Glucose 70 - 99 mg/dL 96  161  97   BUN 8 - 23 mg/dL 27  36  28   Creatinine 0.44 - 1.00 mg/dL 0.96  0.45  4.09   Sodium 135 - 145 mmol/L 139  138  139   Potassium 3.5 - 5.1 mmol/L 4.0  4.2  4.2   Chloride 98 - 111 mmol/L 103  105  103   CO2 22 - 32 mmol/L 29  27  28    Calcium 8.9 - 10.3 mg/dL 9.1  9.0  8.9   Total Protein 6.5 - 8.1 g/dL 7.3  7.1  7.2   Total Bilirubin 0.0 - 1.2 mg/dL 0.3  0.3  0.5   Alkaline Phos 38 - 126 U/L 59  56  57   AST 15 - 41 U/L 26  27  27    ALT 0 - 44 U/L 23  22  23       Lab Results  Component Value Date   TSH 3.257 11/18/2023   11/25/23 positive cologuard 07/24/21 Echo- Left ventricular ejection fraction, by estimation, is 60 to 65%.   Assessment: Encounter Diagnoses  Name Primary?   Positive colorectal cancer screening using Cologuard test Yes   Family history of colon cancer in mother    Laryngeal squamous cell carcinoma (HCC)    Long term current use of anticoagulant therapy    Other dysphagia      73 year old female patient with history of laryngeal squamous cell carcinoma who presents for colonoscopy due to positive Cologuard.  Patient states her last colonoscopy was over 10 years ago at  [place] and per patient was normal.  Patient does have family history of colon cancer in her mother in her 84s.  We will go ahead and schedule colonoscopy at the hospital due to trach and being hard to intubate.  We will also need clearance for Eliquis  to hold from Dr.Gorsuch.    Patient also has chronic dysphagia s/p laryngectomy and radiation. She is dependent on liquid supplement for nutrition. Weight has remained stable and has seen dietitian.  Plan: -Schedule a colonoscopy in hospital (patient has trach, hard to intubate) with Dr. Venice Gillis. The risks and benefits of colonoscopy with possible polypectomy / biopsies were discussed and the patient agrees to proceed. Note- patient states she cannot lay flat on back. Will need clearance for Eliquis  from Dr. Marton Sleeper (oncology) -will request colonoscopy records from [facility]  ADDENDUM: discussed case with Rogena Class- her procedure is best done at the hospital d/t her myocardial mass.   Thank you for the courtesy of this consult. Please call me with any questions or concerns.   Stephine Langbehn, FNP-C  Gastroenterology 01/22/2024, 9:22 AM  Cc: Annette Barters, MD

## 2024-01-25 NOTE — Progress Notes (Signed)
 Agree with assessment/plan.  Edman Circle, MD [facility] [phone]

## 2024-01-29 ENCOUNTER — Telehealth: Payer: Self-pay

## 2024-01-29 NOTE — Telephone Encounter (Signed)
 Called and scheduled appt with Dr. Marton Sleeper on 5/16. They are aware of appt.

## 2024-02-02 ENCOUNTER — Other Ambulatory Visit (HOSPITAL_COMMUNITY): Payer: Self-pay

## 2024-02-02 ENCOUNTER — Other Ambulatory Visit: Payer: Self-pay | Admitting: Hematology and Oncology

## 2024-02-02 ENCOUNTER — Other Ambulatory Visit: Payer: Self-pay | Admitting: Physician Assistant

## 2024-02-02 ENCOUNTER — Other Ambulatory Visit: Payer: Self-pay

## 2024-02-02 ENCOUNTER — Encounter (HOSPITAL_COMMUNITY): Payer: Self-pay

## 2024-02-02 DIAGNOSIS — C321 Malignant neoplasm of supraglottis: Secondary | ICD-10-CM

## 2024-02-02 MED ORDER — APIXABAN 2.5 MG PO TABS
2.5000 mg | ORAL_TABLET | Freq: Two times a day (BID) | ORAL | 3 refills | Status: DC
  Filled 2024-02-02: qty 60, 30d supply, fill #0
  Filled 2024-03-03: qty 60, 30d supply, fill #1
  Filled 2024-04-01: qty 60, 30d supply, fill #2
  Filled 2024-04-28: qty 60, 30d supply, fill #3

## 2024-02-03 ENCOUNTER — Other Ambulatory Visit: Payer: Self-pay

## 2024-02-03 ENCOUNTER — Other Ambulatory Visit (HOSPITAL_COMMUNITY): Payer: Self-pay

## 2024-02-03 MED ORDER — HYDROCODONE-ACETAMINOPHEN 5-325 MG PO TABS
1.0000 | ORAL_TABLET | Freq: Four times a day (QID) | ORAL | 0 refills | Status: DC | PRN
Start: 2024-02-03 — End: 2024-03-03
  Filled 2024-02-03: qty 60, 15d supply, fill #0

## 2024-02-03 NOTE — Telephone Encounter (Signed)
 It took me a while to get her seen She is seen this friday

## 2024-02-05 ENCOUNTER — Encounter: Payer: Self-pay | Admitting: Pharmacist

## 2024-02-05 ENCOUNTER — Other Ambulatory Visit: Payer: Self-pay

## 2024-02-06 ENCOUNTER — Encounter: Payer: Self-pay | Admitting: Hematology and Oncology

## 2024-02-06 ENCOUNTER — Inpatient Hospital Stay

## 2024-02-06 ENCOUNTER — Inpatient Hospital Stay: Attending: Hematology and Oncology | Admitting: Hematology and Oncology

## 2024-02-06 VITALS — BP 106/68 | HR 71 | Temp 97.4°F | Resp 15 | Wt 139.2 lb

## 2024-02-06 DIAGNOSIS — E039 Hypothyroidism, unspecified: Secondary | ICD-10-CM

## 2024-02-06 DIAGNOSIS — E032 Hypothyroidism due to medicaments and other exogenous substances: Secondary | ICD-10-CM | POA: Diagnosis not present

## 2024-02-06 DIAGNOSIS — C329 Malignant neoplasm of larynx, unspecified: Secondary | ICD-10-CM | POA: Diagnosis not present

## 2024-02-06 DIAGNOSIS — Z9221 Personal history of antineoplastic chemotherapy: Secondary | ICD-10-CM | POA: Insufficient documentation

## 2024-02-06 DIAGNOSIS — R195 Other fecal abnormalities: Secondary | ICD-10-CM | POA: Diagnosis not present

## 2024-02-06 DIAGNOSIS — C770 Secondary and unspecified malignant neoplasm of lymph nodes of head, face and neck: Secondary | ICD-10-CM | POA: Insufficient documentation

## 2024-02-06 DIAGNOSIS — T451X5D Adverse effect of antineoplastic and immunosuppressive drugs, subsequent encounter: Secondary | ICD-10-CM | POA: Insufficient documentation

## 2024-02-06 DIAGNOSIS — Z923 Personal history of irradiation: Secondary | ICD-10-CM | POA: Diagnosis not present

## 2024-02-06 DIAGNOSIS — Z95828 Presence of other vascular implants and grafts: Secondary | ICD-10-CM

## 2024-02-06 DIAGNOSIS — C321 Malignant neoplasm of supraglottis: Secondary | ICD-10-CM | POA: Insufficient documentation

## 2024-02-06 DIAGNOSIS — Z79890 Hormone replacement therapy (postmenopausal): Secondary | ICD-10-CM | POA: Insufficient documentation

## 2024-02-06 LAB — CBC WITH DIFFERENTIAL/PLATELET
Abs Immature Granulocytes: 0.01 10*3/uL (ref 0.00–0.07)
Basophils Absolute: 0 10*3/uL (ref 0.0–0.1)
Basophils Relative: 0 %
Eosinophils Absolute: 0.2 10*3/uL (ref 0.0–0.5)
Eosinophils Relative: 4 %
HCT: 39.4 % (ref 36.0–46.0)
Hemoglobin: 13.4 g/dL (ref 12.0–15.0)
Immature Granulocytes: 0 %
Lymphocytes Relative: 24 %
Lymphs Abs: 1.1 10*3/uL (ref 0.7–4.0)
MCH: 28.8 pg (ref 26.0–34.0)
MCHC: 34 g/dL (ref 30.0–36.0)
MCV: 84.7 fL (ref 80.0–100.0)
Monocytes Absolute: 0.5 10*3/uL (ref 0.1–1.0)
Monocytes Relative: 12 %
Neutro Abs: 2.7 10*3/uL (ref 1.7–7.7)
Neutrophils Relative %: 60 %
Platelets: 185 10*3/uL (ref 150–400)
RBC: 4.65 MIL/uL (ref 3.87–5.11)
RDW: 13.2 % (ref 11.5–15.5)
WBC: 4.4 10*3/uL (ref 4.0–10.5)
nRBC: 0 % (ref 0.0–0.2)

## 2024-02-06 LAB — COMPREHENSIVE METABOLIC PANEL WITH GFR
ALT: 18 U/L (ref 0–44)
AST: 24 U/L (ref 15–41)
Albumin: 4.2 g/dL (ref 3.5–5.0)
Alkaline Phosphatase: 53 U/L (ref 38–126)
Anion gap: 7 (ref 5–15)
BUN: 28 mg/dL — ABNORMAL HIGH (ref 8–23)
CO2: 28 mmol/L (ref 22–32)
Calcium: 8.8 mg/dL — ABNORMAL LOW (ref 8.9–10.3)
Chloride: 104 mmol/L (ref 98–111)
Creatinine, Ser: 1 mg/dL (ref 0.44–1.00)
GFR, Estimated: 59 mL/min — ABNORMAL LOW (ref >60)
Glucose, Bld: 97 mg/dL (ref 70–99)
Potassium: 4.3 mmol/L (ref 3.5–5.1)
Sodium: 139 mmol/L (ref 135–145)
Total Bilirubin: 0.3 mg/dL (ref 0.0–1.2)
Total Protein: 7.1 g/dL (ref 6.5–8.1)

## 2024-02-06 LAB — TSH: TSH: 0.888 u[IU]/mL (ref 0.350–4.500)

## 2024-02-06 MED ORDER — HEPARIN SOD (PORK) LOCK FLUSH 100 UNIT/ML IV SOLN
500.0000 [IU] | Freq: Once | INTRAVENOUS | Status: AC
Start: 2024-02-06 — End: 2024-02-06
  Administered 2024-02-06: 500 [IU]

## 2024-02-06 MED ORDER — SODIUM CHLORIDE 0.9% FLUSH
10.0000 mL | Freq: Once | INTRAVENOUS | Status: AC
Start: 2024-02-06 — End: 2024-02-06
  Administered 2024-02-06: 10 mL

## 2024-02-06 NOTE — Assessment & Plan Note (Addendum)
 She is scheduled for colonoscopy for evaluation due to positive Cologuard test From anticoagulation perspective, the patient is instructed to stop anticoagulation therapy 2 days prior to her procedure and to resume 2 days after the procedure depending on whether she has signs of bleeding or not The patient is given both verbal and written instruction and a letter of clearance will be sent to gastroenterologist to proceed with the procedure as indicated

## 2024-02-06 NOTE — Assessment & Plan Note (Addendum)
 Due to exposure to pembrolizumab and prior radiation, she will continue thyroid replacement therapy and we will monitor and adjust the dose as needed

## 2024-02-06 NOTE — Telephone Encounter (Signed)
 Hi,  She can proceed for procedure from my stand point She is given instruction to hold eliquis  48 hours before and resume 2 days after I do not provide cardiac clearance

## 2024-02-06 NOTE — Progress Notes (Signed)
 [facility] OFFICE PROGRESS NOTE  Patient Care Team: Hamrick, Orest Bio, MD as PCP - General (Family Medicine) Almeda Jacobs, MD as Consulting Physician (Hematology and Oncology)  Assessment & Plan [name] [name] squamous cell carcinoma [name] [name] [place]) She had recurrent [name] [name] cancer status post chemotherapy and radiation therapy with relapse in 2021, with metastatic disease to left submandibular lymph node as well as right ventricle She responded well to pembrolizumab  treatment elsewhere except for the right ventricle and subsequently received cisplatin  with stable disease control She has been off treatment since end of April 2023  Last imaging study showed no evidence of cancer recurrence or progression Will order surveillance imaging study in September Abnormal stool test She is scheduled for colonoscopy for evaluation due to positive Cologuard test From anticoagulation perspective, the patient is instructed to stop anticoagulation therapy 2 days prior to her procedure and to resume 2 days after the procedure depending on whether she has signs of bleeding or not The patient is given both verbal and written instruction and a letter of clearance will be sent to gastroenterologist to proceed with the procedure as indicated Acquired hypothyroidism Due to exposure to pembrolizumab  and prior radiation, she will continue thyroid  replacement therapy and we will monitor and adjust the dose as needed  No orders of the defined types were placed in this encounter.    Almeda Jacobs, MD  INTERVAL HISTORY: she returns for surveillance follow-up She is doing well Her chronic pain is stable She denies recent bleeding She is scheduled for colonoscopy on June 24 We discussed perioperative anticoagulation management The patient will take her last dose on June 21, hold for 48 hours prior to procedure and to resume on June 26  PHYSICAL EXAMINATION: ECOG PERFORMANCE STATUS: 0 - Asymptomatic  Vitals:    02/06/24 1131  BP: 106/68  Pulse: 71  Resp: 15  Temp: (!) 97.4 F (36.3 C)  SpO2: 98%   Filed Weights   02/06/24 1131  Weight: 139 lb 3.2 oz (63.1 kg)    Relevant data reviewed during this visit included CBC, CMP and TSH

## 2024-02-06 NOTE — Assessment & Plan Note (Addendum)
 She had recurrent laryngeal cancer status post chemotherapy and radiation therapy with relapse in 2021, with metastatic disease to left submandibular lymph node as well as right ventricle She responded well to pembrolizumab  treatment elsewhere except for the right ventricle and subsequently received cisplatin  with stable disease control She has been off treatment since end of April 2023  Last imaging study showed no evidence of cancer recurrence or progression Will order surveillance imaging study in September

## 2024-03-03 ENCOUNTER — Other Ambulatory Visit: Payer: Self-pay | Admitting: Hematology and Oncology

## 2024-03-03 ENCOUNTER — Other Ambulatory Visit: Payer: Self-pay

## 2024-03-03 ENCOUNTER — Other Ambulatory Visit (HOSPITAL_COMMUNITY): Payer: Self-pay

## 2024-03-03 DIAGNOSIS — C321 Malignant neoplasm of supraglottis: Secondary | ICD-10-CM

## 2024-03-03 MED ORDER — HYDROCODONE-ACETAMINOPHEN 5-325 MG PO TABS
1.0000 | ORAL_TABLET | Freq: Four times a day (QID) | ORAL | 0 refills | Status: DC | PRN
  Filled 2024-03-03: qty 60, 15d supply, fill #0

## 2024-03-08 NOTE — Progress Notes (Addendum)
 Anesthesia Review:  PCP: Cardiologist : ONC- LVO - gorsuch- 02/06/24   PPM/ ICD: Device Orders: Rep Notified:  Chest x-ray : CT Chest- 11/26/23  MR Card- 01/27/22  EKG : Echo : 2022  Stress test: Cardiac Cath :   Activity level:  Sleep Study/ CPAP : Fasting Blood Sugar :      / Checks Blood Sugar -- times a day:    Blood Thinner/ Instructions /Last Dose: ASA / Instructions/ Last Dose :    HX of Laryngeal cancer  PT has trach unable to lay flat    LVMM at [phone].  Son called me back on 03/08/24.  Pt cannot talk.  Son can speka with me on 03/11/24 at approx 5pm when him and his mother can listen in on the conversation.  Will plan to talk to pt and son on 03/11/24 at 5pm.

## 2024-03-15 NOTE — Anesthesia Preprocedure Evaluation (Signed)
 Anesthesia Evaluation  Patient identified by MRN, date of birth, ID band Patient awake    Reviewed: Allergy & Precautions, NPO status , Patient's Chart, lab work & pertinent test results  History of Anesthesia Complications Negative for: history of anesthetic complications  Airway Mallampati: Trach  TM Distance: >3 FB Neck ROM: Full   Comment: Laryngeal squamous cell carcinoma (HCC)  Pt w Trach Dental no notable dental hx. (+) Edentulous Upper, Edentulous Lower   Pulmonary neg pulmonary ROS, former smoker   Pulmonary exam normal breath sounds clear to auscultation       Cardiovascular + CAD and + Past MI  Normal cardiovascular exam Rhythm:Regular Rate:Normal  2022 Echo 1. There is an echogenic RV and RVOT mass. Attachment appears to be on  the right ventricular mid wall. Mass is 32 by 24 mm, though may be  underestimated. Right ventricular systolic function is normal. The right  ventricular size is normal. There is  normal pulmonary artery systolic pressure.   2. Left ventricular ejection fraction, by estimation, is 60 to 65%. The  left ventricle has normal function. The left ventricle has no regional  wall motion abnormalities. Left ventricular diastolic parameters are  consistent with Grade I diastolic  dysfunction (impaired relaxation). The average left ventricular global  longitudinal strain is -19.0 %. The global longitudinal strain is normal.   3. The mitral valve is normal in structure. No evidence of mitral valve  regurgitation.   4. The aortic valve is tricuspid. Aortic valve regurgitation is not  visualized. No aortic stenosis is present.      Neuro/Psych negative neurological ROS  negative psych ROS   GI/Hepatic negative GI ROS, Neg liver ROS,,,  Endo/Other  Hypothyroidism    Renal/GU      Musculoskeletal negative musculoskeletal ROS (+)    Abdominal   Peds  Hematology  (+) Blood dyscrasia,  anemia   Anesthesia Other Findings   Reproductive/Obstetrics                             Anesthesia Physical Anesthesia Plan  ASA: 3  Anesthesia Plan: MAC   Post-op Pain Management: Minimal or no pain anticipated   Induction:   PONV Risk Score and Plan: TIVA and Treatment may vary due to age or medical condition  Airway Management Planned: Tracheostomy and Simple Face Mask  Additional Equipment: None  Intra-op Plan:   Post-operative Plan:   Informed Consent: I have reviewed the patients History and Physical, chart, labs and discussed the procedure including the risks, benefits and alternatives for the proposed anesthesia with the patient or authorized representative who has indicated his/her understanding and acceptance.     Dental advisory given  Plan Discussed with: CRNA  Anesthesia Plan Comments: (Screening colonoscopy)       Anesthesia Quick Evaluation

## 2024-03-16 ENCOUNTER — Ambulatory Visit (HOSPITAL_COMMUNITY)
Admission: RE | Admit: 2024-03-16 | Discharge: 2024-03-16 | Disposition: A | Discharge status: Home / Self Care | Source: Ambulatory Visit | Attending: Gastroenterology | Admitting: Gastroenterology

## 2024-03-16 ENCOUNTER — Encounter (HOSPITAL_COMMUNITY)
Admission: RE | Disposition: A | Discharge status: Home / Self Care | Payer: Self-pay | Source: Ambulatory Visit | Attending: Gastroenterology

## 2024-03-16 ENCOUNTER — Encounter (HOSPITAL_COMMUNITY): Payer: Self-pay | Admitting: Gastroenterology

## 2024-03-16 ENCOUNTER — Ambulatory Visit (HOSPITAL_COMMUNITY): Payer: Self-pay | Admitting: Anesthesiology

## 2024-03-16 ENCOUNTER — Other Ambulatory Visit: Payer: Self-pay

## 2024-03-16 ENCOUNTER — Ambulatory Visit (HOSPITAL_BASED_OUTPATIENT_CLINIC_OR_DEPARTMENT_OTHER): Payer: Self-pay | Admitting: Anesthesiology

## 2024-03-16 DIAGNOSIS — K640 First degree hemorrhoids: Secondary | ICD-10-CM | POA: Insufficient documentation

## 2024-03-16 DIAGNOSIS — K5730 Diverticulosis of large intestine without perforation or abscess without bleeding: Secondary | ICD-10-CM | POA: Diagnosis not present

## 2024-03-16 DIAGNOSIS — Z923 Personal history of irradiation: Secondary | ICD-10-CM | POA: Insufficient documentation

## 2024-03-16 DIAGNOSIS — Z7901 Long term (current) use of anticoagulants: Secondary | ICD-10-CM | POA: Insufficient documentation

## 2024-03-16 DIAGNOSIS — Z87891 Personal history of nicotine dependence: Secondary | ICD-10-CM | POA: Insufficient documentation

## 2024-03-16 DIAGNOSIS — R195 Other fecal abnormalities: Secondary | ICD-10-CM | POA: Diagnosis not present

## 2024-03-16 DIAGNOSIS — D120 Benign neoplasm of cecum: Secondary | ICD-10-CM | POA: Diagnosis not present

## 2024-03-16 DIAGNOSIS — I2510 Atherosclerotic heart disease of native coronary artery without angina pectoris: Secondary | ICD-10-CM | POA: Diagnosis not present

## 2024-03-16 DIAGNOSIS — Q439 Congenital malformation of intestine, unspecified: Secondary | ICD-10-CM

## 2024-03-16 DIAGNOSIS — E039 Hypothyroidism, unspecified: Secondary | ICD-10-CM | POA: Insufficient documentation

## 2024-03-16 DIAGNOSIS — Z8521 Personal history of malignant neoplasm of larynx: Secondary | ICD-10-CM | POA: Insufficient documentation

## 2024-03-16 DIAGNOSIS — Z800 Family history of malignant neoplasm of digestive organs: Secondary | ICD-10-CM | POA: Diagnosis not present

## 2024-03-16 DIAGNOSIS — R1319 Other dysphagia: Secondary | ICD-10-CM | POA: Insufficient documentation

## 2024-03-16 DIAGNOSIS — Z1211 Encounter for screening for malignant neoplasm of colon: Secondary | ICD-10-CM | POA: Diagnosis not present

## 2024-03-16 DIAGNOSIS — I252 Old myocardial infarction: Secondary | ICD-10-CM | POA: Insufficient documentation

## 2024-03-16 DIAGNOSIS — D127 Benign neoplasm of rectosigmoid junction: Secondary | ICD-10-CM | POA: Diagnosis not present

## 2024-03-16 DIAGNOSIS — Z9002 Acquired absence of larynx: Secondary | ICD-10-CM | POA: Insufficient documentation

## 2024-03-16 HISTORY — PX: COLONOSCOPY: SHX5424

## 2024-03-16 SURGERY — COLONOSCOPY
Anesthesia: Monitor Anesthesia Care

## 2024-03-16 MED ORDER — PROPOFOL 10 MG/ML IV BOLUS
INTRAVENOUS | Status: DC | PRN
Start: 2024-03-16 — End: 2024-03-16
  Administered 2024-03-16: 50 mg via INTRAVENOUS

## 2024-03-16 MED ORDER — PROPOFOL 500 MG/50ML IV EMUL
INTRAVENOUS | Status: AC
  Filled 2024-03-16: qty 50

## 2024-03-16 MED ORDER — PROPOFOL 500 MG/50ML IV EMUL
INTRAVENOUS | Status: DC | PRN
  Administered 2024-03-16: 100 ug/kg/min via INTRAVENOUS

## 2024-03-16 MED ORDER — EPHEDRINE SULFATE (PRESSORS) 50 MG/ML IJ SOLN
INTRAMUSCULAR | Status: DC | PRN
  Administered 2024-03-16: 5 mg via INTRAVENOUS

## 2024-03-16 MED ORDER — SODIUM CHLORIDE 0.9% FLUSH: 3.0000 mL | Freq: Two times a day (BID) | INTRAVENOUS | Status: DC

## 2024-03-16 MED ORDER — PROPOFOL 10 MG/ML IV BOLUS
INTRAVENOUS | Status: AC
  Filled 2024-03-16: qty 20

## 2024-03-16 MED ORDER — PHENYLEPHRINE 80 MCG/ML (10ML) SYRINGE FOR IV PUSH (FOR BLOOD PRESSURE SUPPORT)
PREFILLED_SYRINGE | INTRAVENOUS | Status: DC | PRN
  Administered 2024-03-16 (×2): 160 ug via INTRAVENOUS

## 2024-03-16 MED ORDER — DEXMEDETOMIDINE HCL IN NACL 80 MCG/20ML IV SOLN
INTRAVENOUS | Status: DC | PRN
Start: 2024-03-16 — End: 2024-03-16
  Administered 2024-03-16: 8 ug via INTRAVENOUS

## 2024-03-16 MED ORDER — SODIUM CHLORIDE 0.9 % IV SOLN: INTRAVENOUS | Status: DC | PRN

## 2024-03-16 MED ORDER — SODIUM CHLORIDE 0.9% FLUSH: 3.0000 mL | INTRAVENOUS | Status: DC | PRN

## 2024-03-16 MED ORDER — HEPARIN SOD (PORK) LOCK FLUSH 100 UNIT/ML IV SOLN
500.0000 [IU] | INTRAVENOUS | Status: AC | PRN
  Administered 2024-03-16: 500 [IU]
  Filled 2024-03-16: qty 5

## 2024-03-16 NOTE — H&P (Signed)
 Chief Complaint: Positive Cologuard Primary GI Doctor: Dr. Charlanne    HPI:  Patient is a 73 year old female patient with past medical history of laryngeal squamous cell carcinoma, chronic dysphagia (s/p laryngectomy and radiation), CAD, and thyroid  disease, who was referred to me by Stephanie Charlene CROME, MD on 12/03/2023 for positive Cologuard.   --History of laryngeal squamous cell carcinoma, has been off treatment since end of April 2023.   --Acquired hypothyroidism due to exposure to pembrolizumab  and prior radiation,    Interval History    Patient presents for evaluation of positive Cologuard.  She tells me her last colonoscopy was over 10 years ago at [place] and per patient was normal.  She does tell me her mother had breast and colon cancer that required resection in her 66s. Patient denies altered bowel habits, abdominal pain, or rectal bleeding.     Patient also has tracheostomy tube present that is capped today and has difficulty speaking , accompanied by a family member who helps. She has chronic dysphagia due to laryngectomy and radiation. She is dependent on liquid supplement for nutrition. Patient denies nausea, vomiting, or weight loss. Appetite is fair.   She reports that she has quit smoking. Her smoking use included cigarettes. She has a 20.00 pack-year smoking history. She has never used smokeless tobacco.    No alcohol use.   Tracheostomy tube present. She does not require oxygen  therapy.   Patient on Eliquis  2.5 mg po daily by Dr. Lonn Done Readings from Last 3 Encounters:  01/22/24 139 lb 4 oz (63.2 kg)  11/25/23 144 lb 9.6 oz (65.6 kg)  05/23/23 146 lb 3.2 oz (66.3 kg)        Past Medical History:  Diagnosis Date   Cataract     History of radiation therapy 02/11/19- 04/06/19    bilateral neck nodes and upper mediastinum   Laryngeal cancer (HCC) 11/2018   Thyroid  disease                 Past Surgical History:  Procedure Laterality Date    CATARACT EXTRACTION, BILATERAL   left 05/20/16, right 06/03/16    Cataract Extraction w/ IOL Implant; Surgeon: Alm Danas, MD; Location: [facility]; Service: Ophthalmology    CESAREAN SECTION       ESOPHAGOGASTRODUODENOSCOPY (EGD) WITH PROPOFOL  N/A 02/01/2019    Procedure: ESOPHAGOGASTRODUODENOSCOPY (EGD) WITH PROPOFOL ;  Surgeon: Teressa Toribio SQUIBB, MD;  Location: [facility];  Service: Endoscopy;  Laterality: N/A;   EUS N/A 02/01/2019    Procedure: UPPER ENDOSCOPIC ULTRASOUND (EUS) RADIAL-Will need Linear and Forward Viewing EUS Scope;  Surgeon: Teressa Toribio SQUIBB, MD;  Location: [facility];  Service: Endoscopy;  Laterality: N/A;   FINE NEEDLE ASPIRATION N/A 02/01/2019    Procedure: FINE NEEDLE ASPIRATION (FNA) LINEAR;  Surgeon: Teressa Toribio SQUIBB, MD;  Location: [facility];  Service: Endoscopy;  Laterality: N/A;   IR GASTROSTOMY TUBE MOD SED   02/09/2019   IR IMAGING GUIDED PORT INSERTION   02/02/2019   IR PATIENT EVAL TECH 0-60 MINS   01/06/2020   IR RADIOLOGIST EVAL & MGMT   04/10/2020   IR REPLC GASTRO/COLONIC TUBE PERCUT W/FLUORO   12/15/2020   KNEE CARTILAGE SURGERY Right 2019   MULTIPLE EXTRACTIONS WITH ALVEOLOPLASTY N/A 10/26/2020    Procedure: MULTIPLE EXTRACTION WITH ALVEOLOPLASTY;  Surgeon: Celena Lum NOVAK, DMD;  Location: [facility];  Service: Dentistry;  Laterality: N/A;   TRACHEOSTOMY   12/02/2018  [facility]                Current Outpatient Medications  Medication Sig Dispense Refill   apixaban  (ELIQUIS ) 2.5 MG TABS tablet Take 1 tablet (2.5 mg total) by mouth 2 (two) times daily. 60 tablet 3   Cholecalciferol (VITAMIN D3) 125 MCG (5000 UT) CAPS Take 5,000 Units by mouth daily.       HYDROcodone -acetaminophen  (NORCO/VICODIN) 5-325 MG tablet Take 1 tablet by mouth every 6 (six) hours as needed for moderate pain (pain score 4-6). 60 tablet 0   levothyroxine  (SYNTHROID ) 125 MCG tablet Take 1 tablet (125 mcg total) by mouth daily before breakfast. 30 tablet 6   Na Sulfate-K  Sulfate-Mg Sulfate concentrate (SUPREP) 17.5-3.13-1.6 GM/177ML SOLN Take 1 kit (354 mLs total) by mouth once for 1 dose. 354 mL 0   Nutritional Supplements (KATE FARMS PEPTIDE 1.5) LIQD 3 bottles daily 325 mL 11   rosuvastatin (CRESTOR) 10 MG tablet Take 10 mg by mouth daily.          No current facility-administered medications for this visit.           Allergies as of 01/22/2024   (No Known Allergies)           Family History  Problem Relation Age of Onset   Colon cancer Mother     Breast cancer Mother     Cancer Mother     Heart disease Father     Heart attack Father     Esophageal cancer Brother     Pancreatic cancer Neg Hx     Stomach cancer Neg Hx     Diabetes Neg Hx            Review of Systems:    Constitutional: No weight loss, fever, chills, weakness or fatigue HEENT: Eyes: No change in vision               Ears, Nose, Throat:  No change in hearing or congestion Skin: No rash or itching Cardiovascular: No chest pain, chest pressure or palpitations   Respiratory: No SOB or cough Gastrointestinal: See HPI and otherwise negative Genitourinary: No dysuria or change in urinary frequency Neurological: No headache, dizziness or syncope Musculoskeletal: No new muscle or joint pain Hematologic: No bleeding or bruising Psychiatric: No history of depression or anxiety      Physical Exam:  Vital signs: BP 96/60   Pulse 77   Wt 139 lb 4 oz (63.2 kg)   SpO2 97%   BMI 25.47 kg/m    Constitutional:   Pleasant  female appears to be in NAD, Well developed, Well nourished, alert and cooperative Neck:  Tracheostomy tube present  Throat: Oral cavity and pharynx without inflammation, swelling or lesion.  Respiratory: Respirations even and unlabored. Lungs clear to auscultation bilaterally.   No wheezes, crackles, or rhonchi.  Cardiovascular: Normal S1, S2. Regular rate and rhythm. No peripheral edema, cyanosis or pallor.  Gastrointestinal:  Soft, nondistended,  nontender. No rebound or guarding. Normal bowel sounds. No appreciable masses or hepatomegaly. Rectal:  Not performed.  Msk:  Symmetrical without gross deformities. Without edema, no deformity or joint abnormality.  Neurologic:  Alert and  oriented x4;  grossly normal neurologically.  Skin:   Dry and intact without significant lesions or rashes. Psychiatric: Oriented to person, place and time. Demonstrates good judgement and reason without abnormal affect or behaviors.   RELEVANT LABS AND IMAGING: CBC     Latest Ref Rng & Units 11/18/2023  3:23 PM 07/18/2023    8:04 AM 05/16/2023    8:02 AM  CBC  WBC 4.0 - 10.5 K/uL 4.1  3.4  3.8   Hemoglobin 12.0 - 15.0 g/dL 86.2  87.2  86.8   Hematocrit 36.0 - 46.0 % 40.8  38.1  40.1   Platelets 150 - 400 K/uL 207  180  181       CMP         Latest Ref Rng & Units 11/18/2023    3:23 PM 07/18/2023    8:04 AM 05/16/2023    8:02 AM  CMP  Glucose 70 - 99 mg/dL 96  885  97   BUN 8 - 23 mg/dL 27  36  28   Creatinine 0.44 - 1.00 mg/dL 9.03  9.04  8.99   Sodium 135 - 145 mmol/L 139  138  139   Potassium 3.5 - 5.1 mmol/L 4.0  4.2  4.2   Chloride 98 - 111 mmol/L 103  105  103   CO2 22 - 32 mmol/L 29  27  28    Calcium 8.9 - 10.3 mg/dL 9.1  9.0  8.9   Total Protein 6.5 - 8.1 g/dL 7.3  7.1  7.2   Total Bilirubin 0.0 - 1.2 mg/dL 0.3  0.3  0.5   Alkaline Phos 38 - 126 U/L 59  56  57   AST 15 - 41 U/L 26  27  27    ALT 0 - 44 U/L 23  22  23        Recent Labs       Lab Results  Component Value Date    TSH 3.257 11/18/2023     11/25/23 positive cologuard 07/24/21 Echo- Left ventricular ejection fraction, by estimation, is 60 to 65%.    Assessment:     Encounter Diagnoses  Name Primary?   Positive colorectal cancer screening using Cologuard test Yes   Family history of colon cancer in mother     Laryngeal squamous cell carcinoma (HCC)     Long term current use of anticoagulant therapy     Other dysphagia       73 year old female patient with  history of laryngeal squamous cell carcinoma who presents for colonoscopy due to positive Cologuard.  Patient states her last colonoscopy was over 10 years ago at [place] and per patient was normal.  Patient does have family history of colon cancer in her mother in her 26s.  We will go ahead and schedule colonoscopy at the hospital due to trach and being hard to intubate.  We will also need clearance for Eliquis  to hold from Dr.Gorsuch.    Patient also has chronic dysphagia s/p laryngectomy and radiation. She is dependent on liquid supplement for nutrition. Weight has remained stable and has seen dietitian.   Plan: -Schedule a colonoscopy in hospital (patient has trach, hard to intubate) with Dr. Charlanne. The risks and benefits of colonoscopy with possible polypectomy / biopsies were discussed and the patient agrees to proceed. Note- patient states she cannot lay flat on back. Will need clearance for Eliquis  from Dr. Lonn (oncology) -will request [facility]oscopy records [facility]   ADDENDUM: discussed case with Norleen Schillings- her procedure is best done at the hospital d/t her myocardial mass.    Thank you for the courtesy of this consult. Please call me with any questions or concerns.    Deanna May, FNP-C     Attending physician's note   I have taken history, reviewed  the chart and examined the patient. I performed a substantive portion of this encounter, including complete performance of at least one of the key components, in conjunction with the APP. I agree with the Advanced Practitioner's note, impression and recommendations.   + cologuard for colon today off eliquis  x 2 days   Anselm Bring, MD [facility] [phone]

## 2024-03-16 NOTE — Transfer of Care (Signed)
 Immediate Anesthesia Transfer of Care Note  Patient: [name] [name]  Procedure(s) Performed: COLONOSCOPY  Patient Location: PACU and Endoscopy Unit  Anesthesia Type:MAC  Level of Consciousness: awake, alert , oriented, and patient cooperative  Airway & Oxygen  Therapy: Patient Spontanous Breathing and Patient connected to face mask oxygen   Post-op Assessment: Report given to RN and Post -op Vital signs reviewed and stable  Post vital signs: Reviewed and stable  Last Vitals:  Vitals Value Taken Time  BP 80/38 03/16/24 10:30  Temp    Pulse 74 03/16/24 10:30  Resp 18 03/16/24 10:30  SpO2 97 % 03/16/24 10:30  Vitals shown include unfiled device data.  Last Pain:  Vitals:   03/16/24 0911  TempSrc: Temporal  PainSc: 0-No pain         Complications: No notable events documented.

## 2024-03-16 NOTE — Op Note (Signed)
 [facility] Patient Name: [name] [name] Procedure Date: 03/16/2024 MRN: [id number] Attending MD: Lynnie Bring , MD, [id number] Date of Birth: 1951/07/11 CSN: [id number] Age: 73 Admit Type: Ambulatory Procedure:                Colonoscopy Indications:              Positive Cologuard test Providers:                Lynnie Bring, MD, Particia Fischer, RN, Fairy Marina, Technician Referring MD:              Medicines:                Monitored Anesthesia Care Complications:            No immediate complications. Estimated Blood Loss:     Estimated blood loss: none. Procedure:                Pre-Anesthesia Assessment:                           - Prior to the procedure, a History and Physical                            was performed, and patient medications and                            allergies were reviewed. The patient's tolerance of                            previous anesthesia was also reviewed. The risks                            and benefits of the procedure and the sedation                            options and risks were discussed with the patient.                            All questions were answered, and informed consent                            was obtained. Prior Anticoagulants: The patient has                            taken Eliquis  (apixaban ), last dose was 2 days                            prior to procedure. ASA Grade Assessment: III - A                            patient with severe systemic disease. After  reviewing the risks and benefits, the patient was                            deemed in satisfactory condition to undergo the                            procedure.                           After obtaining informed consent, the colonoscope                            was passed under direct vision. Throughout the                            procedure, the patient's blood pressure, pulse, and                             oxygen  saturations were monitored continuously. The                            [id number]-HQ190L ([id number]) Olympus colonoscope was                            introduced through the anus and advanced to the 2                            cm into the ileum. The colonoscopy was somewhat                            difficult due to a tortuous colon. Successful                            completion of the procedure was aided by applying                            abdominal pressure. The patient tolerated the                            procedure well. The quality of the bowel                            preparation was good. The terminal ileum, ileocecal                            valve, appendiceal orifice, and rectum were                            photographed. Scope In: 9:53:19 AM Scope Out: 10:22:56 AM Scope Withdrawal Time: 0 hours 19 minutes 46 seconds  Total Procedure Duration: 0 hours 29 minutes 37 seconds  Findings:      A 10 mm polyp was found in the cecum. The polyp was sessile. The polyp       was removed with a piecemeal technique using  a cold snare. Resection and       retrieval were complete. Estimated blood loss was minimal.      A 10 mm polyp was found in the recto-sigmoid colon, 15 cm from the anal       verge. The polyp was semi-sessile. The polyp was removed with a hot       snare. Resection and retrieval were complete.      A few rare small-mouthed diverticula were found in the sigmoid colon.      Non-bleeding internal hemorrhoids were found during retroflexion. The       hemorrhoids were small and Grade I (internal hemorrhoids that do not       prolapse).      The terminal ileum appeared normal.      Retroflexion in the right colon was performed.      The exam was otherwise without abnormality on direct and retroflexion       views. Impression:               - One 10 mm polyp in the cecum, removed piecemeal                            using a cold snare.  Resected and retrieved.                           - One 10 mm polyp at the recto-sigmoid colon,                            removed with a hot snare. Resected and retrieved.                           - Minimal sigmoid diverticulosis.                           - Non-bleeding internal hemorrhoids.                           - The examined portion of the ileum was normal.                           - The examination was otherwise normal on direct                            and retroflexion views. Moderate Sedation:      Not Applicable - Patient had care per Anesthesia. Recommendation:           - Patient has a contact number available for                            emergencies. The signs and symptoms of potential                            delayed complications were discussed with the                            patient. Return to normal activities tomorrow.  Written discharge instructions were provided to the                            patient.                           - Resume previous diet.                           - Continue present medications.                           - Resume Eliquis  (apixaban ) at prior dose from 6/27                           - The findings and recommendations were discussed                            with the designated responsible adult. Procedure Code(s):        --- Professional ---                           [phone], Colonoscopy, flexible; with removal of                            tumor(s), polyp(s), or other lesion(s) by snare                            technique Diagnosis Code(s):        --- Professional ---                           D12.0, Benign neoplasm of cecum                           D12.7, Benign neoplasm of rectosigmoid junction                           K64.0, First degree hemorrhoids                           R19.5, Other fecal abnormalities                           K57.30, Diverticulosis of large intestine without                             perforation or abscess without bleeding CPT copyright 2022 American [employer] Association. All rights reserved. The codes documented in this report are preliminary and upon coder review may  be revised to meet current compliance requirements. Lynnie Bring, MD 03/16/2024 10:34:23 AM This report has been signed electronically. Number of Addenda: 0

## 2024-03-16 NOTE — Anesthesia Procedure Notes (Signed)
 Procedure Name: MAC Date/Time: 03/16/2024 9:47 AM  Performed by: Nada Corean CROME, CRNAPre-anesthesia Checklist: Patient identified, Emergency Drugs available, Suction available, Patient being monitored and Timeout performed Patient Re-evaluated:Patient Re-evaluated prior to induction Oxygen  Delivery Method: Simple face mask Preoxygenation: Pre-oxygenation with 100% oxygen  Induction Type: IV induction Airway Equipment and Method: Tracheostomy Placement Confirmation: positive ETCO2 Dental Injury: Teeth and Oropharynx as per pre-operative assessment  Comments: Simple face mask over tracheostomy;

## 2024-03-16 NOTE — Anesthesia Postprocedure Evaluation (Signed)
 Anesthesia Post Note  Patient: [name] [name]  Procedure(s) Performed: COLONOSCOPY     Patient location during evaluation: Endoscopy Anesthesia Type: MAC Level of consciousness: awake and alert Pain management: pain level controlled Vital Signs Assessment: post-procedure vital signs reviewed and stable Respiratory status: spontaneous breathing, nonlabored ventilation, respiratory function stable and patient connected to nasal cannula oxygen  Cardiovascular status: blood pressure returned to baseline and stable Postop Assessment: no apparent nausea or vomiting Anesthetic complications: no  No notable events documented.  Last Vitals:  Vitals:   03/16/24 1055 03/16/24 1105  BP: 110/60 114/71  Pulse: 72 81  Resp: 17 20  Temp:    SpO2: 100% 98%    Last Pain:  Vitals:   03/16/24 1105  TempSrc:   PainSc: 0-No pain                 [name]

## 2024-03-16 NOTE — Discharge Instructions (Signed)

## 2024-03-17 LAB — SURGICAL PATHOLOGY

## 2024-03-18 ENCOUNTER — Encounter (HOSPITAL_COMMUNITY): Payer: Self-pay | Admitting: Gastroenterology

## 2024-03-25 ENCOUNTER — Ambulatory Visit: Payer: Self-pay | Admitting: Gastroenterology

## 2024-04-01 ENCOUNTER — Other Ambulatory Visit (HOSPITAL_COMMUNITY): Payer: Self-pay

## 2024-04-01 ENCOUNTER — Other Ambulatory Visit: Payer: Self-pay | Admitting: Hematology and Oncology

## 2024-04-01 ENCOUNTER — Other Ambulatory Visit: Payer: Self-pay

## 2024-04-01 DIAGNOSIS — C321 Malignant neoplasm of supraglottis: Secondary | ICD-10-CM

## 2024-04-01 MED ORDER — HYDROCODONE-ACETAMINOPHEN 5-325 MG PO TABS
1.0000 | ORAL_TABLET | Freq: Four times a day (QID) | ORAL | 0 refills | Status: DC | PRN
  Filled 2024-04-01: qty 60, 15d supply, fill #0

## 2024-04-27 ENCOUNTER — Telehealth: Payer: Self-pay

## 2024-04-27 NOTE — Telephone Encounter (Signed)
 Called and s/w pt's contact, Cindy to move pt's appt from 9/12 at 0840 to 0900. She is agreeable to this and verbalized thanks.

## 2024-04-28 ENCOUNTER — Other Ambulatory Visit: Payer: Self-pay | Admitting: Hematology and Oncology

## 2024-04-28 ENCOUNTER — Other Ambulatory Visit (HOSPITAL_COMMUNITY): Payer: Self-pay

## 2024-04-28 ENCOUNTER — Other Ambulatory Visit: Payer: Self-pay

## 2024-04-28 DIAGNOSIS — C321 Malignant neoplasm of supraglottis: Secondary | ICD-10-CM

## 2024-04-28 MED ORDER — HYDROCODONE-ACETAMINOPHEN 5-325 MG PO TABS
1.0000 | ORAL_TABLET | Freq: Four times a day (QID) | ORAL | 0 refills | Status: DC | PRN
  Filled 2024-04-28: qty 60, 15d supply, fill #0

## 2024-05-25 ENCOUNTER — Encounter (HOSPITAL_COMMUNITY): Payer: Self-pay

## 2024-05-25 ENCOUNTER — Other Ambulatory Visit: Payer: Self-pay | Admitting: Hematology and Oncology

## 2024-05-25 ENCOUNTER — Other Ambulatory Visit (HOSPITAL_COMMUNITY): Payer: Self-pay

## 2024-05-25 DIAGNOSIS — C321 Malignant neoplasm of supraglottis: Secondary | ICD-10-CM

## 2024-05-25 MED ORDER — HYDROCODONE-ACETAMINOPHEN 5-325 MG PO TABS
1.0000 | ORAL_TABLET | Freq: Four times a day (QID) | ORAL | 0 refills | Status: DC | PRN
  Filled 2024-05-25: qty 60, 15d supply, fill #0

## 2024-05-25 MED ORDER — APIXABAN 2.5 MG PO TABS
2.5000 mg | ORAL_TABLET | Freq: Two times a day (BID) | ORAL | 3 refills | Status: DC
  Filled 2024-05-25: qty 60, 30d supply, fill #0
  Filled 2024-07-01: qty 60, 30d supply, fill #1
  Filled 2024-07-26: qty 60, 30d supply, fill #2
  Filled 2024-08-15 – 2024-08-18 (×2): qty 60, 30d supply, fill #3

## 2024-05-26 ENCOUNTER — Other Ambulatory Visit: Payer: Self-pay

## 2024-05-28 ENCOUNTER — Other Ambulatory Visit

## 2024-05-28 ENCOUNTER — Encounter (HOSPITAL_COMMUNITY): Payer: Self-pay

## 2024-05-28 ENCOUNTER — Inpatient Hospital Stay: Attending: Hematology and Oncology

## 2024-05-28 ENCOUNTER — Other Ambulatory Visit (HOSPITAL_COMMUNITY): Payer: Self-pay

## 2024-05-28 ENCOUNTER — Ambulatory Visit: Payer: Self-pay | Admitting: Hematology and Oncology

## 2024-05-28 ENCOUNTER — Ambulatory Visit (HOSPITAL_COMMUNITY)
Admission: RE | Admit: 2024-05-28 | Discharge: 2024-05-28 | Disposition: A | Discharge status: Home / Self Care | Source: Ambulatory Visit | Attending: Hematology and Oncology | Admitting: Hematology and Oncology

## 2024-05-28 ENCOUNTER — Other Ambulatory Visit: Payer: Self-pay | Admitting: Hematology and Oncology

## 2024-05-28 ENCOUNTER — Other Ambulatory Visit: Payer: Self-pay

## 2024-05-28 DIAGNOSIS — Z9221 Personal history of antineoplastic chemotherapy: Secondary | ICD-10-CM | POA: Diagnosis not present

## 2024-05-28 DIAGNOSIS — E032 Hypothyroidism due to medicaments and other exogenous substances: Secondary | ICD-10-CM | POA: Diagnosis not present

## 2024-05-28 DIAGNOSIS — Z930 Tracheostomy status: Secondary | ICD-10-CM | POA: Diagnosis not present

## 2024-05-28 DIAGNOSIS — R911 Solitary pulmonary nodule: Secondary | ICD-10-CM | POA: Diagnosis not present

## 2024-05-28 DIAGNOSIS — C321 Malignant neoplasm of supraglottis: Secondary | ICD-10-CM | POA: Insufficient documentation

## 2024-05-28 DIAGNOSIS — C770 Secondary and unspecified malignant neoplasm of lymph nodes of head, face and neck: Secondary | ICD-10-CM | POA: Diagnosis not present

## 2024-05-28 DIAGNOSIS — T451X5D Adverse effect of antineoplastic and immunosuppressive drugs, subsequent encounter: Secondary | ICD-10-CM | POA: Insufficient documentation

## 2024-05-28 DIAGNOSIS — C329 Malignant neoplasm of larynx, unspecified: Secondary | ICD-10-CM

## 2024-05-28 DIAGNOSIS — Z923 Personal history of irradiation: Secondary | ICD-10-CM | POA: Diagnosis not present

## 2024-05-28 DIAGNOSIS — Z79890 Hormone replacement therapy (postmenopausal): Secondary | ICD-10-CM | POA: Diagnosis not present

## 2024-05-28 DIAGNOSIS — E039 Hypothyroidism, unspecified: Secondary | ICD-10-CM

## 2024-05-28 DIAGNOSIS — C760 Malignant neoplasm of head, face and neck: Secondary | ICD-10-CM | POA: Diagnosis not present

## 2024-05-28 LAB — CBC WITH DIFFERENTIAL/PLATELET
Abs Immature Granulocytes: 0.01 K/uL (ref 0.00–0.07)
Basophils Absolute: 0 K/uL (ref 0.0–0.1)
Basophils Relative: 1 %
Eosinophils Absolute: 0.2 K/uL (ref 0.0–0.5)
Eosinophils Relative: 4 %
HCT: 41.5 % (ref 36.0–46.0)
Hemoglobin: 14.2 g/dL (ref 12.0–15.0)
Immature Granulocytes: 0 %
Lymphocytes Relative: 26 %
Lymphs Abs: 1.1 K/uL (ref 0.7–4.0)
MCH: 29 pg (ref 26.0–34.0)
MCHC: 34.2 g/dL (ref 30.0–36.0)
MCV: 84.9 fL (ref 80.0–100.0)
Monocytes Absolute: 0.5 K/uL (ref 0.1–1.0)
Monocytes Relative: 11 %
Neutro Abs: 2.5 K/uL (ref 1.7–7.7)
Neutrophils Relative %: 58 %
Platelets: 190 K/uL (ref 150–400)
RBC: 4.89 MIL/uL (ref 3.87–5.11)
RDW: 13.9 % (ref 11.5–15.5)
WBC: 4.3 K/uL (ref 4.0–10.5)
nRBC: 0 % (ref 0.0–0.2)

## 2024-05-28 LAB — TSH: TSH: 12.1 u[IU]/mL — ABNORMAL HIGH (ref 0.350–4.500)

## 2024-05-28 LAB — COMPREHENSIVE METABOLIC PANEL WITH GFR
ALT: 32 U/L (ref 0–44)
AST: 31 U/L (ref 15–41)
Albumin: 4.4 g/dL (ref 3.5–5.0)
Alkaline Phosphatase: 58 U/L (ref 38–126)
Anion gap: 7 (ref 5–15)
BUN: 29 mg/dL — ABNORMAL HIGH (ref 8–23)
CO2: 29 mmol/L (ref 22–32)
Calcium: 9 mg/dL (ref 8.9–10.3)
Chloride: 103 mmol/L (ref 98–111)
Creatinine, Ser: 0.93 mg/dL (ref 0.44–1.00)
GFR, Estimated: >60 mL/min (ref >60)
Glucose, Bld: 101 mg/dL — ABNORMAL HIGH (ref 70–99)
Potassium: 4 mmol/L (ref 3.5–5.1)
Sodium: 139 mmol/L (ref 135–145)
Total Bilirubin: 0.5 mg/dL (ref 0.0–1.2)
Total Protein: 7.4 g/dL (ref 6.5–8.1)

## 2024-05-28 MED ORDER — HEPARIN SOD (PORK) LOCK FLUSH 100 UNIT/ML IV SOLN
500.0000 [IU] | Freq: Once | INTRAVENOUS | Status: AC
  Administered 2024-05-28: 500 [IU] via INTRAVENOUS

## 2024-05-28 MED ORDER — LEVOTHYROXINE SODIUM 150 MCG PO TABS
150.0000 ug | ORAL_TABLET | Freq: Every day | ORAL | 1 refills | Status: DC
  Filled 2024-05-28: qty 60, 60d supply, fill #0
  Filled 2024-07-20: qty 60, 60d supply, fill #1

## 2024-05-28 MED ORDER — HEPARIN SOD (PORK) LOCK FLUSH 100 UNIT/ML IV SOLN
INTRAVENOUS | Status: AC
  Filled 2024-05-28: qty 5

## 2024-05-28 MED ORDER — IOHEXOL 300 MG/ML  SOLN
75.0000 mL | Freq: Once | INTRAMUSCULAR | Status: AC | PRN
  Administered 2024-05-28: 75 mL via INTRAVENOUS

## 2024-05-28 NOTE — Telephone Encounter (Addendum)
 As per Dr. Lonn, called patient and relayed the message below, family member had full understanding and had no further questions at this time.    ----- Message from Almarie Lonn sent at 05/28/2024  2:27 PM EDT ----- Please call her/son. TSH is high. I sent new prescription synthroid  to [facility] ----- Message ----- From: Rebecka, [facility] [place] Sent: 05/28/2024   8:58 AM EDT To: Almarie Lonn, MD

## 2024-06-04 ENCOUNTER — Ambulatory Visit: Admitting: Hematology and Oncology

## 2024-06-07 ENCOUNTER — Encounter: Payer: Self-pay | Admitting: Hematology and Oncology

## 2024-06-07 ENCOUNTER — Inpatient Hospital Stay (HOSPITAL_BASED_OUTPATIENT_CLINIC_OR_DEPARTMENT_OTHER): Admitting: Hematology and Oncology

## 2024-06-07 VITALS — BP 124/80 | HR 87 | Temp 97.7°F | Resp 18 | Ht 62.0 in | Wt 140.2 lb

## 2024-06-07 DIAGNOSIS — C321 Malignant neoplasm of supraglottis: Secondary | ICD-10-CM | POA: Diagnosis not present

## 2024-06-07 DIAGNOSIS — Z9221 Personal history of antineoplastic chemotherapy: Secondary | ICD-10-CM | POA: Diagnosis not present

## 2024-06-07 DIAGNOSIS — C329 Malignant neoplasm of larynx, unspecified: Secondary | ICD-10-CM

## 2024-06-07 DIAGNOSIS — E039 Hypothyroidism, unspecified: Secondary | ICD-10-CM

## 2024-06-07 DIAGNOSIS — Z930 Tracheostomy status: Secondary | ICD-10-CM | POA: Diagnosis not present

## 2024-06-07 DIAGNOSIS — Z430 Encounter for attention to tracheostomy: Secondary | ICD-10-CM

## 2024-06-07 DIAGNOSIS — Z923 Personal history of irradiation: Secondary | ICD-10-CM | POA: Diagnosis not present

## 2024-06-07 DIAGNOSIS — E032 Hypothyroidism due to medicaments and other exogenous substances: Secondary | ICD-10-CM | POA: Diagnosis not present

## 2024-06-07 DIAGNOSIS — Z79890 Hormone replacement therapy (postmenopausal): Secondary | ICD-10-CM | POA: Diagnosis not present

## 2024-06-07 DIAGNOSIS — C770 Secondary and unspecified malignant neoplasm of lymph nodes of head, face and neck: Secondary | ICD-10-CM | POA: Diagnosis not present

## 2024-06-07 DIAGNOSIS — T451X5D Adverse effect of antineoplastic and immunosuppressive drugs, subsequent encounter: Secondary | ICD-10-CM | POA: Diagnosis not present

## 2024-06-07 NOTE — Assessment & Plan Note (Addendum)
 She has slight increased bleeding likely due to side effects of anticoagulation therapy She will continue tracheostomy care through the ENT office

## 2024-06-07 NOTE — Progress Notes (Signed)
 [facility] OFFICE PROGRESS NOTE  Patient Care Team: Hamrick, Charlene CROME, MD as PCP - General (Family Medicine) Lonn Hicks, MD as Consulting Physician (Hematology and Oncology)  Assessment & Plan Laryngeal squamous cell carcinoma [facility]) She had recurrent laryngeal cancer status post chemotherapy and radiation therapy with relapse in 2021, with metastatic disease to left submandibular lymph node as well as right ventricle She responded well to pembrolizumab  treatment elsewhere except for the right ventricle and subsequently received cisplatin  with stable disease control She has been off treatment since end of April 2023  Last imaging study showed no evidence of cancer recurrence or progression Will order surveillance imaging study in September of 2026 I recommend port removal and she agreed I plan to see her again in 6 months for further follow-up Tracheostomy care [facility]) She has slight increased bleeding likely due to side effects of anticoagulation therapy She will continue tracheostomy care through the ENT office Acquired hypothyroidism Due to exposure to pembrolizumab  and prior radiation, she will continue thyroid  replacement therapy and we will monitor and adjust the dose as needed I adjusted the dose of her Synthroid  recently due to elevated TSH  Orders Placed This Encounter  Procedures   IR REMOVAL TUN ACCESS W/ PORT W/O FL MOD SED    Call son for appt    Standing Status:   Future    Expected Date:   06/14/2024    Expiration Date:   06/07/2025    Reason for exam::   no need port    Preferred Imaging Location?:   [facility]     Hicks Lonn, MD  INTERVAL HISTORY: she returns for surveillance follow-up for history of recurrent laryngeal cancer with metastatic disease to her ventricle She is here accompanied by her son She has mild intermittent bleeding from tracheostomy site Her weight is stable Her recent labs were reviewed which show elevated TSH, dose  of Synthroid  was adjusted I reviewed multiple CT imaging study with the patient which showed no evidence of disease recurrence/progression  PHYSICAL EXAMINATION: ECOG PERFORMANCE STATUS: 0 - Asymptomatic  Vitals:   06/07/24 1152  BP: 124/80  Pulse: 87  Resp: 18  Temp: 97.7 F (36.5 C)  SpO2: 96%   Filed Weights   06/07/24 1152  Weight: 140 lb 3.2 oz (63.6 kg)    Relevant data reviewed during this visit included CBC, CMP, TSH, CT imaging from September 2025 in comparison with February 2025 imaging

## 2024-06-07 NOTE — Assessment & Plan Note (Addendum)
 Due to exposure to pembrolizumab  and prior radiation, she will continue thyroid  replacement therapy and we will monitor and adjust the dose as needed I adjusted the dose of her Synthroid  recently due to elevated TSH

## 2024-06-07 NOTE — Assessment & Plan Note (Addendum)
 She had recurrent laryngeal cancer status post chemotherapy and radiation therapy with relapse in 2021, with metastatic disease to left submandibular lymph node as well as right ventricle She responded well to pembrolizumab  treatment elsewhere except for the right ventricle and subsequently received cisplatin  with stable disease control She has been off treatment since end of April 2023  Last imaging study showed no evidence of cancer recurrence or progression Will order surveillance imaging study in September of 2026 I recommend port removal and she agreed I plan to see her again in 6 months for further follow-up

## 2024-06-21 ENCOUNTER — Ambulatory Visit (HOSPITAL_COMMUNITY)
Admission: RE | Admit: 2024-06-21 | Discharge: 2024-06-21 | Disposition: A | Discharge status: Home / Self Care | Source: Ambulatory Visit | Attending: Hematology and Oncology | Admitting: Hematology and Oncology

## 2024-06-21 DIAGNOSIS — Z452 Encounter for adjustment and management of vascular access device: Secondary | ICD-10-CM | POA: Diagnosis not present

## 2024-06-21 DIAGNOSIS — C329 Malignant neoplasm of larynx, unspecified: Secondary | ICD-10-CM | POA: Diagnosis not present

## 2024-06-21 HISTORY — PX: IR REMOVAL TUN ACCESS W/ PORT W/O FL MOD SED: IMG2290

## 2024-06-21 MED ORDER — LIDOCAINE-EPINEPHRINE 1 %-1:100000 IJ SOLN
INTRAMUSCULAR | Status: AC
  Filled 2024-06-21: qty 1

## 2024-06-21 MED ORDER — LIDOCAINE-EPINEPHRINE 1 %-1:100000 IJ SOLN
20.0000 mL | Freq: Once | INTRAMUSCULAR | Status: AC
  Administered 2024-06-21: 7 mL via INTRADERMAL

## 2024-06-21 NOTE — Procedures (Signed)
 Interventional Radiology Procedure Note  Procedure: Port removal  Estimated Blood Loss: < 10 mL  Findings: SL chest port right side removed.  [name] DELENA Banner, MD

## 2024-06-24 ENCOUNTER — Other Ambulatory Visit: Payer: Self-pay

## 2024-06-24 ENCOUNTER — Other Ambulatory Visit (HOSPITAL_COMMUNITY): Payer: Self-pay

## 2024-06-24 ENCOUNTER — Other Ambulatory Visit: Payer: Self-pay | Admitting: Hematology and Oncology

## 2024-06-24 DIAGNOSIS — C321 Malignant neoplasm of supraglottis: Secondary | ICD-10-CM

## 2024-06-24 MED ORDER — HYDROCODONE-ACETAMINOPHEN 5-325 MG PO TABS
1.0000 | ORAL_TABLET | Freq: Four times a day (QID) | ORAL | 0 refills | Status: DC | PRN
  Filled 2024-06-24: qty 60, 15d supply, fill #0

## 2024-06-28 DIAGNOSIS — U071 COVID-19: Secondary | ICD-10-CM | POA: Diagnosis not present

## 2024-06-28 DIAGNOSIS — C7989 Secondary malignant neoplasm of other specified sites: Secondary | ICD-10-CM | POA: Diagnosis not present

## 2024-06-28 DIAGNOSIS — Z1331 Encounter for screening for depression: Secondary | ICD-10-CM | POA: Diagnosis not present

## 2024-06-28 DIAGNOSIS — Z930 Tracheostomy status: Secondary | ICD-10-CM | POA: Diagnosis not present

## 2024-06-28 DIAGNOSIS — R6889 Other general symptoms and signs: Secondary | ICD-10-CM | POA: Diagnosis not present

## 2024-07-01 ENCOUNTER — Other Ambulatory Visit (HOSPITAL_COMMUNITY): Payer: Self-pay

## 2024-07-20 ENCOUNTER — Other Ambulatory Visit: Payer: Self-pay | Admitting: Hematology and Oncology

## 2024-07-20 ENCOUNTER — Other Ambulatory Visit: Payer: Self-pay

## 2024-07-20 ENCOUNTER — Other Ambulatory Visit (HOSPITAL_COMMUNITY): Payer: Self-pay

## 2024-07-20 DIAGNOSIS — C321 Malignant neoplasm of supraglottis: Secondary | ICD-10-CM

## 2024-07-20 MED ORDER — HYDROCODONE-ACETAMINOPHEN 5-325 MG PO TABS
1.0000 | ORAL_TABLET | Freq: Four times a day (QID) | ORAL | 0 refills | Status: DC | PRN
  Filled 2024-07-20: qty 60, 15d supply, fill #0

## 2024-07-26 ENCOUNTER — Other Ambulatory Visit: Payer: Self-pay

## 2024-08-15 ENCOUNTER — Other Ambulatory Visit: Payer: Self-pay | Admitting: Hematology and Oncology

## 2024-08-15 DIAGNOSIS — C321 Malignant neoplasm of supraglottis: Secondary | ICD-10-CM

## 2024-08-16 ENCOUNTER — Other Ambulatory Visit: Payer: Self-pay

## 2024-08-16 ENCOUNTER — Other Ambulatory Visit (HOSPITAL_COMMUNITY): Payer: Self-pay

## 2024-08-16 MED ORDER — HYDROCODONE-ACETAMINOPHEN 5-325 MG PO TABS
1.0000 | ORAL_TABLET | Freq: Four times a day (QID) | ORAL | 0 refills | Status: DC | PRN
  Filled 2024-08-16: qty 60, 15d supply, fill #0

## 2024-08-24 DIAGNOSIS — R0609 Other forms of dyspnea: Secondary | ICD-10-CM | POA: Diagnosis not present

## 2024-08-24 DIAGNOSIS — U099 Post covid-19 condition, unspecified: Secondary | ICD-10-CM | POA: Diagnosis not present

## 2024-09-12 ENCOUNTER — Other Ambulatory Visit: Payer: Self-pay | Admitting: Hematology and Oncology

## 2024-09-12 DIAGNOSIS — C321 Malignant neoplasm of supraglottis: Secondary | ICD-10-CM

## 2024-09-14 ENCOUNTER — Encounter (HOSPITAL_COMMUNITY): Payer: Self-pay

## 2024-09-15 ENCOUNTER — Encounter (HOSPITAL_COMMUNITY): Payer: Self-pay

## 2024-09-15 ENCOUNTER — Other Ambulatory Visit (HOSPITAL_COMMUNITY): Payer: Self-pay

## 2024-09-15 ENCOUNTER — Other Ambulatory Visit: Payer: Self-pay | Admitting: Oncology

## 2024-09-15 ENCOUNTER — Telehealth: Payer: Self-pay

## 2024-09-15 ENCOUNTER — Other Ambulatory Visit: Payer: Self-pay | Admitting: Hematology and Oncology

## 2024-09-15 DIAGNOSIS — C321 Malignant neoplasm of supraglottis: Secondary | ICD-10-CM

## 2024-09-15 MED ORDER — LEVOTHYROXINE SODIUM 150 MCG PO TABS
150.0000 ug | ORAL_TABLET | Freq: Every day | ORAL | 1 refills | Status: AC
  Filled 2024-09-15: qty 60, 60d supply, fill #0

## 2024-09-15 MED ORDER — HYDROCODONE-ACETAMINOPHEN 5-325 MG PO TABS
1.0000 | ORAL_TABLET | Freq: Four times a day (QID) | ORAL | 0 refills | Status: DC | PRN
  Filled 2024-09-15: qty 60, 15d supply, fill #0

## 2024-09-15 NOTE — Telephone Encounter (Signed)
 Documentation Note  Called on behalf of the patient to request refills for the following medications:  HYDROcodone -acetaminophen  (Norco/Vicodin) 5-325 mg - Rx #[id number]  Levothyroxine  (Synthroid ) 150 mcg - Rx #[id number]  Refills were sent to [facility] by the on-call provider, Dr. Autumn.

## 2024-09-17 ENCOUNTER — Encounter (HOSPITAL_COMMUNITY): Payer: Self-pay

## 2024-09-17 ENCOUNTER — Other Ambulatory Visit (HOSPITAL_COMMUNITY): Payer: Self-pay

## 2024-09-18 ENCOUNTER — Encounter (HOSPITAL_COMMUNITY): Payer: Self-pay

## 2024-09-18 ENCOUNTER — Other Ambulatory Visit (HOSPITAL_COMMUNITY): Payer: Self-pay

## 2024-09-19 ENCOUNTER — Other Ambulatory Visit: Payer: Self-pay | Admitting: Hematology and Oncology

## 2024-09-20 ENCOUNTER — Other Ambulatory Visit (HOSPITAL_COMMUNITY): Payer: Self-pay

## 2024-09-20 ENCOUNTER — Other Ambulatory Visit: Payer: Self-pay

## 2024-09-20 MED ORDER — APIXABAN 2.5 MG PO TABS
2.5000 mg | ORAL_TABLET | Freq: Two times a day (BID) | ORAL | 3 refills | Status: AC
  Filled 2024-09-20: qty 60, 30d supply, fill #0
  Filled 2024-10-17: qty 60, 30d supply, fill #1

## 2024-10-17 ENCOUNTER — Encounter (HOSPITAL_COMMUNITY): Payer: Self-pay

## 2024-10-17 ENCOUNTER — Other Ambulatory Visit: Payer: Self-pay | Admitting: Oncology

## 2024-10-17 DIAGNOSIS — C321 Malignant neoplasm of supraglottis: Secondary | ICD-10-CM

## 2024-10-18 ENCOUNTER — Encounter (HOSPITAL_COMMUNITY): Payer: Self-pay

## 2024-10-18 ENCOUNTER — Other Ambulatory Visit (HOSPITAL_COMMUNITY): Payer: Self-pay

## 2024-10-18 ENCOUNTER — Other Ambulatory Visit: Payer: Self-pay | Admitting: Oncology

## 2024-10-18 ENCOUNTER — Encounter: Payer: Self-pay | Admitting: Pharmacy Technician

## 2024-10-18 ENCOUNTER — Other Ambulatory Visit: Payer: Self-pay

## 2024-10-18 DIAGNOSIS — C321 Malignant neoplasm of supraglottis: Secondary | ICD-10-CM

## 2024-10-19 ENCOUNTER — Other Ambulatory Visit: Payer: Self-pay

## 2024-10-19 ENCOUNTER — Other Ambulatory Visit (HOSPITAL_COMMUNITY): Payer: Self-pay

## 2024-10-19 ENCOUNTER — Other Ambulatory Visit: Payer: Self-pay | Admitting: Hematology and Oncology

## 2024-10-19 DIAGNOSIS — C321 Malignant neoplasm of supraglottis: Secondary | ICD-10-CM

## 2024-10-19 MED ORDER — HYDROCODONE-ACETAMINOPHEN 5-325 MG PO TABS
1.0000 | ORAL_TABLET | Freq: Four times a day (QID) | ORAL | 0 refills | Status: AC | PRN
  Filled 2024-10-19: qty 60, 15d supply, fill #0

## 2024-10-21 ENCOUNTER — Other Ambulatory Visit (HOSPITAL_COMMUNITY): Payer: Self-pay

## 2024-10-26 ENCOUNTER — Other Ambulatory Visit (HOSPITAL_COMMUNITY): Payer: Self-pay

## 2024-12-06 ENCOUNTER — Inpatient Hospital Stay

## 2024-12-06 ENCOUNTER — Inpatient Hospital Stay: Admitting: Hematology and Oncology
